# Patient Record
Sex: Male | Born: 1970
Health system: Southern US, Community
[De-identification: ages and names within clinical notes are randomized; demographics above are authoritative.]

## PROBLEM LIST (undated history)

## (undated) ENCOUNTER — Emergency Department (HOSPITAL_COMMUNITY): Admission: EM | Payer: Self-pay | Source: Home / Self Care

## (undated) DIAGNOSIS — H35033 Hypertensive retinopathy, bilateral: Secondary | ICD-10-CM

## (undated) DIAGNOSIS — J9601 Acute respiratory failure with hypoxia: Secondary | ICD-10-CM

## (undated) DIAGNOSIS — E7800 Pure hypercholesterolemia, unspecified: Secondary | ICD-10-CM

## (undated) DIAGNOSIS — N189 Chronic kidney disease, unspecified: Secondary | ICD-10-CM

## (undated) DIAGNOSIS — D631 Anemia in chronic kidney disease: Secondary | ICD-10-CM

## (undated) DIAGNOSIS — I5032 Chronic diastolic (congestive) heart failure: Secondary | ICD-10-CM

## (undated) DIAGNOSIS — R519 Headache, unspecified: Secondary | ICD-10-CM

## (undated) DIAGNOSIS — H269 Unspecified cataract: Secondary | ICD-10-CM

## (undated) DIAGNOSIS — R079 Chest pain, unspecified: Secondary | ICD-10-CM

## (undated) DIAGNOSIS — I509 Heart failure, unspecified: Secondary | ICD-10-CM

## (undated) DIAGNOSIS — R51 Headache: Secondary | ICD-10-CM

## (undated) DIAGNOSIS — E119 Type 2 diabetes mellitus without complications: Secondary | ICD-10-CM

## (undated) DIAGNOSIS — D509 Iron deficiency anemia, unspecified: Secondary | ICD-10-CM

## (undated) DIAGNOSIS — B974 Respiratory syncytial virus as the cause of diseases classified elsewhere: Secondary | ICD-10-CM

## (undated) DIAGNOSIS — J189 Pneumonia, unspecified organism: Secondary | ICD-10-CM

## (undated) DIAGNOSIS — R609 Edema, unspecified: Secondary | ICD-10-CM

## (undated) DIAGNOSIS — E113519 Type 2 diabetes mellitus with proliferative diabetic retinopathy with macular edema, unspecified eye: Secondary | ICD-10-CM

## (undated) DIAGNOSIS — I10 Essential (primary) hypertension: Secondary | ICD-10-CM

## (undated) HISTORY — DX: Anemia in chronic kidney disease: D63.1

## (undated) HISTORY — DX: Chronic kidney disease, unspecified: N18.9

## (undated) HISTORY — DX: Unspecified cataract: H26.9

## (undated) HISTORY — DX: Type 2 diabetes mellitus with proliferative diabetic retinopathy with macular edema, unspecified eye: E11.3519

## (undated) HISTORY — DX: Type 2 diabetes mellitus without complications: E11.9

## (undated) HISTORY — DX: Edema, unspecified: R60.9

## (undated) HISTORY — DX: Essential (primary) hypertension: I10

## (undated) HISTORY — DX: Iron deficiency anemia, unspecified: D50.9

## (undated) HISTORY — DX: Chronic diastolic (congestive) heart failure: I50.32

## (undated) HISTORY — DX: Hypertensive retinopathy, bilateral: H35.033

---

## 1997-08-03 DIAGNOSIS — E119 Type 2 diabetes mellitus without complications: Secondary | ICD-10-CM

## 1997-08-03 HISTORY — DX: Type 2 diabetes mellitus without complications: E11.9

## 2001-10-20 DIAGNOSIS — E1122 Type 2 diabetes mellitus with diabetic chronic kidney disease: Secondary | ICD-10-CM

## 2001-10-20 DIAGNOSIS — E1129 Type 2 diabetes mellitus with other diabetic kidney complication: Secondary | ICD-10-CM | POA: Insufficient documentation

## 2001-10-20 DIAGNOSIS — E118 Type 2 diabetes mellitus with unspecified complications: Secondary | ICD-10-CM | POA: Insufficient documentation

## 2001-10-20 DIAGNOSIS — N186 End stage renal disease: Secondary | ICD-10-CM

## 2004-06-17 ENCOUNTER — Ambulatory Visit: Payer: Self-pay | Admitting: *Deleted

## 2004-06-17 ENCOUNTER — Ambulatory Visit: Payer: Self-pay | Admitting: Family Medicine

## 2004-07-29 ENCOUNTER — Ambulatory Visit: Payer: Self-pay | Admitting: Family Medicine

## 2004-09-30 ENCOUNTER — Ambulatory Visit: Payer: Self-pay | Admitting: Family Medicine

## 2004-11-11 ENCOUNTER — Ambulatory Visit: Payer: Self-pay | Admitting: Family Medicine

## 2004-12-30 ENCOUNTER — Ambulatory Visit: Payer: Self-pay | Admitting: Family Medicine

## 2005-01-30 ENCOUNTER — Ambulatory Visit: Payer: Self-pay | Admitting: Family Medicine

## 2005-04-01 ENCOUNTER — Ambulatory Visit: Payer: Self-pay | Admitting: Family Medicine

## 2005-07-07 ENCOUNTER — Ambulatory Visit: Payer: Self-pay | Admitting: Family Medicine

## 2005-08-26 ENCOUNTER — Ambulatory Visit: Payer: Self-pay | Admitting: Family Medicine

## 2005-09-30 ENCOUNTER — Ambulatory Visit: Payer: Self-pay | Admitting: Family Medicine

## 2005-11-30 ENCOUNTER — Ambulatory Visit: Payer: Self-pay | Admitting: Family Medicine

## 2006-04-01 ENCOUNTER — Ambulatory Visit: Payer: Self-pay | Admitting: Family Medicine

## 2006-07-02 ENCOUNTER — Ambulatory Visit: Payer: Self-pay | Admitting: Family Medicine

## 2006-10-11 ENCOUNTER — Ambulatory Visit: Payer: Self-pay | Admitting: Family Medicine

## 2007-01-11 ENCOUNTER — Ambulatory Visit: Payer: Self-pay | Admitting: Family Medicine

## 2007-02-01 ENCOUNTER — Ambulatory Visit: Payer: Self-pay | Admitting: Family Medicine

## 2007-04-20 ENCOUNTER — Encounter (INDEPENDENT_AMBULATORY_CARE_PROVIDER_SITE_OTHER): Payer: Self-pay | Admitting: *Deleted

## 2007-05-29 ENCOUNTER — Emergency Department (HOSPITAL_COMMUNITY): Admission: EM | Admit: 2007-05-29 | Discharge: 2007-05-29 | Payer: Self-pay | Admitting: Emergency Medicine

## 2007-06-07 ENCOUNTER — Emergency Department (HOSPITAL_COMMUNITY): Admission: EM | Admit: 2007-06-07 | Discharge: 2007-06-07 | Payer: Self-pay | Admitting: Emergency Medicine

## 2010-01-31 ENCOUNTER — Emergency Department (HOSPITAL_COMMUNITY): Admission: EM | Admit: 2010-01-31 | Discharge: 2010-01-31 | Payer: Self-pay | Admitting: Emergency Medicine

## 2016-06-04 ENCOUNTER — Ambulatory Visit (INDEPENDENT_AMBULATORY_CARE_PROVIDER_SITE_OTHER): Payer: Self-pay | Admitting: Internal Medicine

## 2016-06-04 ENCOUNTER — Encounter: Payer: Self-pay | Admitting: Internal Medicine

## 2016-06-04 VITALS — BP 128/68 | HR 86 | Ht 63.0 in | Wt 120.0 lb

## 2016-06-04 DIAGNOSIS — E118 Type 2 diabetes mellitus with unspecified complications: Secondary | ICD-10-CM

## 2016-06-04 DIAGNOSIS — E1165 Type 2 diabetes mellitus with hyperglycemia: Secondary | ICD-10-CM

## 2016-06-04 DIAGNOSIS — R609 Edema, unspecified: Secondary | ICD-10-CM

## 2016-06-04 DIAGNOSIS — IMO0002 Reserved for concepts with insufficient information to code with codable children: Secondary | ICD-10-CM

## 2016-06-04 DIAGNOSIS — K029 Dental caries, unspecified: Secondary | ICD-10-CM

## 2016-06-04 MED ORDER — GLIPIZIDE 5 MG PO TABS: ORAL_TABLET | ORAL | 11 refills | Status: DC

## 2016-06-04 MED ORDER — GLUCOSE BLOOD VI STRP: ORAL_STRIP | 12 refills | Status: DC

## 2016-06-04 MED ORDER — METFORMIN HCL 500 MG PO TABS: ORAL_TABLET | ORAL | 11 refills | Status: DC

## 2016-06-04 MED ORDER — AGAMATRIX PRESTO W/DEVICE KIT: PACK | 0 refills | Status: DC

## 2016-06-04 NOTE — Progress Notes (Signed)
Subjective:    Patient ID: Blake Santana, male    DOB: 08/03/1971, 45 y.o.   MRN: [id number]  HPI   Here to establish.  1.  DM Type 2:  Has had for more than 10 years.  Has not used medication for 4 years as he could not find help. Has taken Metformin 500 mg twice daily as well as insulin prior to that.  He states when he was on the Metformin it worked well for him.   Cannot recall if he ever took Glipizide. States he has not lost or gained any weight throughout the years. No history of hypercholesterolemia. Denies history of renal disease Does have numbness and tingling in bilateral feet only in distal feet--mainly toes. No chest pain or problems with heart in past Blurry vision for 2 years.  More on the left than right. Reading glasses have helped.    2.  Ankle swelling every day for past 3 years.  Swelling is better in the morning. Elevates feet at night.  No outpatient prescriptions have been marked as taking for the 06/04/16 encounter (Office Visit) with Mack Hook, MD.    No Known Allergies   Past Medical History:  Diagnosis Date  . Diabetes mellitus without complication ([place]) 7353   History reviewed. No pertinent surgical history.   Family History  Problem Relation Age of Onset  . Diabetes Mother   . Kidney disease Mother     renal failure--diabetes, cause of death  . Hypertension Mother   . Diabetes Father   . Kidney disease Father     Cause of death:  kidney failure form diabetes  . Alcohol abuse Father   . Diabetes Sister    Social History   Social History  . Marital status: Married    Spouse name: [name]  . Number of children: 1  . Years of education: 9   Occupational History  . [employer]    Social History Main Topics  . Smoking status: Former Research scientist (life sciences)  . Smokeless tobacco: Never Used  . Alcohol use No     Comment: Used to drink too much once weekly, but nothing for 1 year.  . Drug use: No  . Sexual activity: Not on file    Other Topics Concern  . Not on file   Social History Narrative   Originally from [place]   Came to [place]. In [place] with his girlfriend and their son.              Review of Systems     Objective:   Physical Exam Very pale and thin, appears chronically ill. HEENT:  PERRL, EOMI, unable to see discs well. Very pale palpebral conjunctivae and palms. TMs pearly gray, throat without injection.  Significant dental decay Neck:  Supple, No adenopathy Chest:  CTA CV:  RRR with normal S1 and S2, No S3, S4 or murmur. No carotid bruits.  Carotid, radial and DP pulses normal and equal. Abd:  S, NT, No HSM or mass, + BS LE:  Circular scars on pretibial areas bilaterally.  Mild pitting edema       Assessment & Plan:  1.  DM, uncontrolled:  Check A1C, start Glipizide 5 mg twice daily with food.  Metformin 500 mg twice daily with food. Urine microalbumin.   CMP Long discussion regarding good diet/lifestyle habits. Monitoring device Rx sent, to check glucose at least twice daily and bring those in at next visit (2 weeks). Encouraged Influenza  at [facility] or will see in 2 weeks if we have more available.  2.  Appears to be anemic:  CBC, follow up lab as needed.  3.  Dental Decay:  Dental referral.  4.  Ankle edema:  Checking renal function, level of anemia before any treatment.

## 2016-06-05 LAB — COMPREHENSIVE METABOLIC PANEL
A/G RATIO: 1.2 (ref 1.2–2.2)
ALBUMIN: 3.6 g/dL (ref 3.5–5.5)
ALT: 12 IU/L (ref 0–44)
AST: 15 IU/L (ref 0–40)
Alkaline Phosphatase: 90 IU/L (ref 39–117)
BILIRUBIN TOTAL: 0.3 mg/dL (ref 0.0–1.2)
BUN / CREAT RATIO: 20 (ref 9–20)
BUN: 21 mg/dL (ref 6–24)
CHLORIDE: 97 mmol/L (ref 96–106)
CO2: 27 mmol/L (ref 18–29)
Calcium: 8.9 mg/dL (ref 8.7–10.2)
Creatinine, Ser: 1.05 mg/dL (ref 0.76–1.27)
GFR calc non Af Amer: 84 mL/min/{1.73_m2} (ref >59)
GFR, EST AFRICAN AMERICAN: 97 mL/min/{1.73_m2} (ref >59)
Globulin, Total: 2.9 g/dL (ref 1.5–4.5)
Glucose: 515 mg/dL (ref 65–99)
POTASSIUM: 4.5 mmol/L (ref 3.5–5.2)
Sodium: 136 mmol/L (ref 134–144)
TOTAL PROTEIN: 6.5 g/dL (ref 6.0–8.5)

## 2016-06-05 LAB — CBC WITH DIFFERENTIAL/PLATELET
BASOS ABS: 0 10*3/uL (ref 0.0–0.2)
Basos: 1 %
EOS (ABSOLUTE): 0.3 10*3/uL (ref 0.0–0.4)
Eos: 6 %
Hematocrit: 33.4 % — ABNORMAL LOW (ref 37.5–51.0)
Hemoglobin: 10.8 g/dL — ABNORMAL LOW (ref 12.6–17.7)
Immature Grans (Abs): 0 10*3/uL (ref 0.0–0.1)
Immature Granulocytes: 0 %
LYMPHS ABS: 1.9 10*3/uL (ref 0.7–3.1)
Lymphs: 31 %
MCH: 28.7 pg (ref 26.6–33.0)
MCHC: 32.3 g/dL (ref 31.5–35.7)
MCV: 89 fL (ref 79–97)
MONOS ABS: 0.4 10*3/uL (ref 0.1–0.9)
Monocytes: 6 %
NEUTROS ABS: 3.4 10*3/uL (ref 1.4–7.0)
Neutrophils: 56 %
PLATELETS: 223 10*3/uL (ref 150–379)
RBC: 3.76 x10E6/uL — ABNORMAL LOW (ref 4.14–5.80)
RDW: 13.5 % (ref 12.3–15.4)
WBC: 6 10*3/uL (ref 3.4–10.8)

## 2016-06-05 LAB — MICROALBUMIN, URINE: Microalbumin, Urine: 371 ug/mL

## 2016-06-05 LAB — HGB A1C W/O EAG: HEMOGLOBIN A1C: 14.2 % — AB (ref 4.8–5.6)

## 2016-06-18 ENCOUNTER — Encounter: Payer: Self-pay | Admitting: Internal Medicine

## 2016-06-18 ENCOUNTER — Ambulatory Visit (INDEPENDENT_AMBULATORY_CARE_PROVIDER_SITE_OTHER): Payer: Self-pay | Admitting: Internal Medicine

## 2016-06-18 VITALS — BP 120/70 | HR 72 | Resp 12 | Ht 62.0 in | Wt 120.0 lb

## 2016-06-18 DIAGNOSIS — E118 Type 2 diabetes mellitus with unspecified complications: Secondary | ICD-10-CM

## 2016-06-18 DIAGNOSIS — N189 Chronic kidney disease, unspecified: Secondary | ICD-10-CM | POA: Insufficient documentation

## 2016-06-18 DIAGNOSIS — D631 Anemia in chronic kidney disease: Secondary | ICD-10-CM | POA: Insufficient documentation

## 2016-06-18 DIAGNOSIS — D649 Anemia, unspecified: Secondary | ICD-10-CM

## 2016-06-18 DIAGNOSIS — D509 Iron deficiency anemia, unspecified: Secondary | ICD-10-CM | POA: Insufficient documentation

## 2016-06-18 DIAGNOSIS — IMO0002 Reserved for concepts with insufficient information to code with codable children: Secondary | ICD-10-CM

## 2016-06-18 DIAGNOSIS — E1165 Type 2 diabetes mellitus with hyperglycemia: Secondary | ICD-10-CM

## 2016-06-18 DIAGNOSIS — R809 Proteinuria, unspecified: Secondary | ICD-10-CM

## 2016-06-18 HISTORY — DX: Iron deficiency anemia, unspecified: D50.9

## 2016-06-18 LAB — GLUCOSE, POCT (MANUAL RESULT ENTRY): POC Glucose: 392 mg/dl — AB (ref 70–99)

## 2016-06-18 MED ORDER — LISINOPRIL 2.5 MG PO TABS: 2.5000 mg | ORAL_TABLET | Freq: Every day | ORAL | 11 refills | Status: DC

## 2016-06-18 NOTE — Progress Notes (Signed)
   Subjective:    Patient ID: Blake Santana, male    DOB: 1971/03/16, 45 y.o.   MRN: [id number]  HPI 1.  DM type 2:  Took  Glipizide and Metformin for 1 week.  Stopped as he was having diarrhea. While he was taking medication the lowest sugar was 390.  2. Anemia CBC Latest Ref Rng & Units 06/04/2016  WBC 3.4 - 10.8 x10E3/uL 6.0  Hematocrit 37.5 - 51.0 % 33.4(L)  Platelets 150 - 379 x10E3/uL 223   Denies hematochezia or melena.  Kidney function was okay, though did have microalbuminuria and has low muscle mass. Normocytic.   3.  Peripheral edema:  Again, moderate anemia, microalbuminuria.  Is a bit decreased.     4 .  Microalbuminuria:  Not taking ACE I. Discussed.  Current Meds  Medication Sig  . Blood Glucose Monitoring Suppl (AGAMATRIX PRESTO) w/Device KIT Check sugars twice daily  . glucose blood (AGAMATRIX PRESTO TEST) test strip Check sugars twice daily    No Known Allergies  Review of Systems     Objective:   Physical Exam  Pale, NAD Lungs:  CTA CV:  RRR without murmur or rub, radial and DP pulses normal and equal LE:  No edema        Assessment & Plan:  1.  DM:  To try Glipizide 5 mg twice daily with meals alone for 1 week.  If tolerates, to add Metformin 250 mg with breakfast (after the meal) daily.  Stop Metformin if diarrhea recurs.  Call the clinic with diarrhea recurrence.  2.  Anemia:  Iron plus TIBC/% sat. Vitamin B12.    3.  Microalbuminuria:  2.5 mg Lisinopril daily -start.  4.  LE edema:  Resolved.  Follow up 1 month

## 2016-06-19 LAB — IRON AND TIBC
IRON SATURATION: 28 % (ref 15–55)
IRON: 82 ug/dL (ref 38–169)
TIBC: 298 ug/dL (ref 250–450)
UIBC: 216 ug/dL (ref 111–343)

## 2016-06-19 LAB — VITAMIN B12: VITAMIN B 12: 1104 pg/mL — AB (ref 211–946)

## 2016-07-03 NOTE — Progress Notes (Signed)
Patient informed of results and will get Ferrous Gluconate today

## 2016-07-03 NOTE — Progress Notes (Signed)
Pt. Aware of Ferrous Gluconate 325 mg twice daily with half an orange

## 2016-07-16 ENCOUNTER — Encounter: Payer: Self-pay | Admitting: Internal Medicine

## 2016-07-16 ENCOUNTER — Ambulatory Visit (INDEPENDENT_AMBULATORY_CARE_PROVIDER_SITE_OTHER): Payer: Self-pay | Admitting: Internal Medicine

## 2016-07-16 VITALS — BP 140/82 | HR 82 | Resp 12 | Ht 62.75 in | Wt 126.0 lb

## 2016-07-16 DIAGNOSIS — E1165 Type 2 diabetes mellitus with hyperglycemia: Secondary | ICD-10-CM

## 2016-07-16 DIAGNOSIS — H538 Other visual disturbances: Secondary | ICD-10-CM

## 2016-07-16 DIAGNOSIS — E118 Type 2 diabetes mellitus with unspecified complications: Secondary | ICD-10-CM

## 2016-07-16 DIAGNOSIS — R809 Proteinuria, unspecified: Secondary | ICD-10-CM

## 2016-07-16 DIAGNOSIS — IMO0002 Reserved for concepts with insufficient information to code with codable children: Secondary | ICD-10-CM

## 2016-07-16 LAB — GLUCOSE, POCT (MANUAL RESULT ENTRY): POC GLUCOSE: 151 mg/dL — AB (ref 70–99)

## 2016-07-16 NOTE — Progress Notes (Signed)
Subjective:    Patient ID: Blake Santana, male    DOB: [birth year], 45 y.o.   MRN: [id number]  HPI   1.  DM:  Did fine taking Glipizide alone twice daily with meals. Was able to add Metformin back--taking at end of meal twice daily for 3 weeks.   Checking sugars in the morning before meal and sometimes in the afternoon before meals, but only once daily.  Has been writing down, but forgot to bring his documentation. Sugars in the morning running under 200.  Generally, in high 100 range.  States he has made changes in how he eats.  States working on eating more vegetables daily and less meat.  His wife does most of the cooking.  She can come with him in the future with a visit to discuss cooking for the entire family (3 kids at home) He is having blurry vision with driving and at work.  Has not had an eye check yet--willing to pay $69.  Taking low dose Lisinopril for microalbuminuria.   2.  States swelling of legs was getting better, but about 1 week ago, began getting worse again.   He has not been eating saltier food or more prepared food.  He has been on his feet more.    3.  HM:  Has not had influenza vaccine yet and we are out.       4  Mild iron deficiency anemia:  Started Ferrous Gluconate elemental iron 27 mg twice daily with half an orange.  Vitamin B12 was normal. No problems with iron.  Has not received guaiac cards yet.    Current Meds  Medication Sig  . Blood Glucose Monitoring Suppl (AGAMATRIX PRESTO) w/Device KIT Check sugars twice daily  . ferrous gluconate (FERGON) 225 (27 Fe) MG tablet Take 240 mg by mouth 2 (two) times daily.  Marland Kitchen glipiZIDE (GLUCOTROL) 5 MG tablet 1 tab by mouth twice daily with meals  . glucose blood (AGAMATRIX PRESTO TEST) test strip Check sugars twice daily  . lisinopril (PRINIVIL,ZESTRIL) 2.5 MG tablet Take 1 tablet (2.5 mg total) by mouth daily.  . metFORMIN (GLUCOPHAGE) 500 MG tablet 1 tab by mouth twice daily with food    No Known  Allergies      Review of Systems     Objective:   Physical Exam   HEENT:  PERRL, EOMI, discs sharp Neck:  Supple, No adenopathy Chest:  CTA CV:  RRR with normal S1 and S2, No S3, S4 or murmur.  No JVD, Radial and DP pulses normal and equal Varices of LE bilaterally with mild edema of ankles. Abd: S, NT, No HSM or mass, + BS        Assessment & Plan:  1.  DM Type 2:  Control improving.  Glucose today is 151, less than half of that last month.  Return in 3 months fasting for labs:  FLP, A1C, BMP. OV with me 1 week later  2.  Blurred Vision:  Likely due to improved glucose.  Does, however, need yearly eye check--referral to Optometry.  OK with $69 charge.  3.  Microalbuminuria:  Lisinopril.  BP up a bit today--follow.  4.  Iron deficiency anemia:  Taking iron.  Needs to get 3 card Guaiac packet back in 2 weeks.  Return for CBC in 1 month  5.  LE edema:  Does have varicosities of LE:  Gave measurements with order form for either knee high or thigh high compression stockings--measurements line  up more with women's sizing, but circled both men's and women's small for him.  Avoid salt and elevate legs at night when home.

## 2016-07-16 NOTE — Patient Instructions (Addendum)
Measured for thigh high and knee high compression stockings--small size:  Ordering booklet for lower cost given. To put on every morning when gets up and take off prior to bedtime.  Get stool cards back to clinic completed in 2 weeks.

## 2016-08-18 ENCOUNTER — Other Ambulatory Visit (INDEPENDENT_AMBULATORY_CARE_PROVIDER_SITE_OTHER): Payer: Self-pay

## 2016-08-18 DIAGNOSIS — Z1211 Encounter for screening for malignant neoplasm of colon: Secondary | ICD-10-CM

## 2016-08-18 LAB — POC HEMOCCULT BLD/STL (HOME/3-CARD/SCREEN)
FECAL OCCULT BLD: NEGATIVE
FECAL OCCULT BLD: NEGATIVE
Fecal Occult Blood, POC: NEGATIVE

## 2016-08-20 ENCOUNTER — Other Ambulatory Visit: Payer: Self-pay

## 2016-09-23 ENCOUNTER — Telehealth: Payer: Self-pay | Admitting: Internal Medicine

## 2016-09-23 NOTE — Telephone Encounter (Signed)
Patient states he has been seen by Optometrist Dr. Clent Demark at [facility] [phone] on 08/24/2016 and he gave him the Rx for eye glasses (Rx already faxed to [facility] for voucher) but also told him that he needs to probably have surgery because it looks like patient has "blood inside the eye" so patient wants Dr. Amil Amen to review the notes and see what she suggest doing or if she needs to be referred to surgery. Notes were given to Dr. Amil Amen already  Patient is also having eye pain and he has blurry vision on left eye

## 2016-09-24 NOTE — Telephone Encounter (Signed)
To Dr. Mulberry for further direction 

## 2016-10-08 ENCOUNTER — Other Ambulatory Visit (INDEPENDENT_AMBULATORY_CARE_PROVIDER_SITE_OTHER): Payer: Self-pay

## 2016-10-08 ENCOUNTER — Telehealth: Payer: Self-pay | Admitting: Internal Medicine

## 2016-10-08 DIAGNOSIS — IMO0002 Reserved for concepts with insufficient information to code with codable children: Secondary | ICD-10-CM

## 2016-10-08 DIAGNOSIS — Z79899 Other long term (current) drug therapy: Secondary | ICD-10-CM

## 2016-10-08 DIAGNOSIS — E1165 Type 2 diabetes mellitus with hyperglycemia: Secondary | ICD-10-CM

## 2016-10-08 DIAGNOSIS — Z13220 Encounter for screening for lipoid disorders: Secondary | ICD-10-CM

## 2016-10-08 DIAGNOSIS — E118 Type 2 diabetes mellitus with unspecified complications: Secondary | ICD-10-CM

## 2016-10-08 DIAGNOSIS — D649 Anemia, unspecified: Secondary | ICD-10-CM

## 2016-10-08 NOTE — Progress Notes (Signed)
Patient was notified of labs result today in person as I have been unable to reach him for quite some time

## 2016-10-08 NOTE — Telephone Encounter (Signed)
Patient says he went to his optometrist appointment and got his glasses and was given the results from x-rays that he brought into the clinic. He wants to know what the results from the x-ray were.

## 2016-10-08 NOTE — Telephone Encounter (Signed)
To Dr. Mulberry for further direction 

## 2016-10-09 LAB — CBC WITH DIFFERENTIAL/PLATELET
Basophils Absolute: 0 10*3/uL (ref 0.0–0.2)
Basos: 0 %
EOS (ABSOLUTE): 0.7 10*3/uL — AB (ref 0.0–0.4)
EOS: 7 %
HEMATOCRIT: 28.1 % — AB (ref 37.5–51.0)
Hemoglobin: 9.3 g/dL — ABNORMAL LOW (ref 13.0–17.7)
Immature Grans (Abs): 0 10*3/uL (ref 0.0–0.1)
Immature Granulocytes: 0 %
LYMPHS ABS: 2.2 10*3/uL (ref 0.7–3.1)
Lymphs: 24 %
MCH: 28.9 pg (ref 26.6–33.0)
MCHC: 33.1 g/dL (ref 31.5–35.7)
MCV: 87 fL (ref 79–97)
MONOS ABS: 0.8 10*3/uL (ref 0.1–0.9)
Monocytes: 9 %
NEUTROS PCT: 60 %
Neutrophils Absolute: 5.4 10*3/uL (ref 1.4–7.0)
PLATELETS: 214 10*3/uL (ref 150–379)
RBC: 3.22 x10E6/uL — AB (ref 4.14–5.80)
RDW: 14.1 % (ref 12.3–15.4)
WBC: 9.2 10*3/uL (ref 3.4–10.8)

## 2016-10-09 LAB — BASIC METABOLIC PANEL
BUN/Creatinine Ratio: 24 — ABNORMAL HIGH (ref 9–20)
BUN: 32 mg/dL — AB (ref 6–24)
CALCIUM: 9 mg/dL (ref 8.7–10.2)
CO2: 26 mmol/L (ref 18–29)
CREATININE: 1.34 mg/dL — AB (ref 0.76–1.27)
Chloride: 102 mmol/L (ref 96–106)
GFR, EST AFRICAN AMERICAN: 72 mL/min/{1.73_m2} (ref >59)
GFR, EST NON AFRICAN AMERICAN: 63 mL/min/{1.73_m2} (ref >59)
Glucose: 90 mg/dL (ref 65–99)
POTASSIUM: 4.8 mmol/L (ref 3.5–5.2)
Sodium: 142 mmol/L (ref 134–144)

## 2016-10-09 LAB — HGB A1C W/O EAG: Hgb A1c MFr Bld: 7.6 % — ABNORMAL HIGH (ref 4.8–5.6)

## 2016-10-09 LAB — LIPID PANEL W/O CHOL/HDL RATIO
Cholesterol, Total: 181 mg/dL (ref 100–199)
HDL: 46 mg/dL (ref >39)
LDL CALC: 120 mg/dL — AB (ref 0–99)
TRIGLYCERIDES: 73 mg/dL (ref 0–149)
VLDL CHOLESTEROL CAL: 15 mg/dL (ref 5–40)

## 2016-10-12 NOTE — Telephone Encounter (Signed)
This is in here twice?  Please confirm the initial one came back to you and we should always try to answer on the same note and not send out 2 separate ones.

## 2016-10-12 NOTE — Telephone Encounter (Signed)
I am confused:  What xray did her get with optometry?  I have his prescription for eyeglasses in his media tab, but no xray results. Was he wanting a voucher for eyeglasses?  If so, please arrange for that and they will need his optometry prescription.

## 2016-10-13 NOTE — Telephone Encounter (Signed)
To Estefania. Please call patient to clarify exactly what he needs

## 2016-10-13 NOTE — Telephone Encounter (Signed)
Message was received. See next note

## 2016-10-15 ENCOUNTER — Ambulatory Visit (INDEPENDENT_AMBULATORY_CARE_PROVIDER_SITE_OTHER): Payer: Self-pay | Admitting: Internal Medicine

## 2016-10-15 ENCOUNTER — Encounter: Payer: Self-pay | Admitting: Internal Medicine

## 2016-10-15 VITALS — BP 144/90 | HR 74 | Resp 14 | Ht 63.0 in | Wt 125.0 lb

## 2016-10-15 DIAGNOSIS — E113313 Type 2 diabetes mellitus with moderate nonproliferative diabetic retinopathy with macular edema, bilateral: Secondary | ICD-10-CM

## 2016-10-15 DIAGNOSIS — IMO0002 Reserved for concepts with insufficient information to code with codable children: Secondary | ICD-10-CM

## 2016-10-15 DIAGNOSIS — R7989 Other specified abnormal findings of blood chemistry: Secondary | ICD-10-CM

## 2016-10-15 DIAGNOSIS — E1165 Type 2 diabetes mellitus with hyperglycemia: Secondary | ICD-10-CM

## 2016-10-15 DIAGNOSIS — R809 Proteinuria, unspecified: Secondary | ICD-10-CM

## 2016-10-15 DIAGNOSIS — E118 Type 2 diabetes mellitus with unspecified complications: Secondary | ICD-10-CM

## 2016-10-15 DIAGNOSIS — E11319 Type 2 diabetes mellitus with unspecified diabetic retinopathy without macular edema: Secondary | ICD-10-CM | POA: Insufficient documentation

## 2016-10-15 DIAGNOSIS — E785 Hyperlipidemia, unspecified: Secondary | ICD-10-CM | POA: Insufficient documentation

## 2016-10-15 MED ORDER — METFORMIN HCL ER 500 MG PO TB24: ORAL_TABLET | ORAL | 11 refills | Status: DC

## 2016-10-15 MED ORDER — ATORVASTATIN CALCIUM 20 MG PO TABS: ORAL_TABLET | ORAL | 11 refills | Status: DC

## 2016-10-15 NOTE — Progress Notes (Signed)
   Subjective:    Patient ID: Blake Santana, male    DOB: [birth year], 46 y.o.   MRN: [id number]  HPI   1.  DM:  Holding Metformin once weekly as develops diarrhea with regular release.  His A1C is down significantly to 7.6% from 14 range in November. Creatinine recently a bit high at 1.34.    2.  Anemia:  B12 and Folate were okay.  Iron studies were really in normal range, though toward low end of normal.  Stool cards were negative for blood times 3.  Has been taking iron twice daily.  Suspect this is anemia of chronic disease.   3.  Microalbuminuria:  BP up a bit today.  States he has not missed low dose Lisinopril.    4.  Hyperlipidemia:   Discussed his LDL is still too high and would recommend treatment.   Feels he is eating in healthy way Has not worked in 1 month--not physically active.  Has not been able to see well enough to paint as vision too impaired.   Cannot recall name of eye doctor.  Glasses not helping that he received--Vision works.  He was told he had diabetic changes or bleeding in one of his eyes and should go to a retinal specialist. Lipid Panel     Component Value Date/Time   CHOL 181 10/08/2016 1001   TRIG 73 10/08/2016 1001   HDL 46 10/08/2016 1001   LDLCALC 120 (H) 10/08/2016 1001    Current Meds  Medication Sig  . Blood Glucose Monitoring Suppl (AGAMATRIX PRESTO) w/Device KIT Check sugars twice daily  . ferrous gluconate (FERGON) 225 (27 Fe) MG tablet Take 240 mg by mouth 2 (two) times daily.  Marland Kitchen glipiZIDE (GLUCOTROL) 5 MG tablet 1 tab by mouth twice daily with meals  . glucose blood (AGAMATRIX PRESTO TEST) test strip Check sugars twice daily  . lisinopril (PRINIVIL,ZESTRIL) 2.5 MG tablet Take 1 tablet (2.5 mg total) by mouth daily.  . [DISCONTINUED] metFORMIN (GLUCOPHAGE) 500 MG tablet 1 tab by mouth twice daily with food   No Known Allergies    Review of Systems     Objective:   Physical Exam  NAD Still a bit pale appearing. HEENT:  PERRL,  EOMI Neck:  Supple, No adenopathy Chest:  CTA CV:  RRR with normal S1 and S2, No S3, S4 or murmur, radial pulses normal and equal LE:  No edema        Assessment & Plan:  1.  DM:  Much improved control, though would like to see closer to 7.0% or less.   Change to Metformin ER 500 mg twice daily and see if helps with diarrhea and to avoid missing doses.  2.  Diabetic nonproliferative retinopathy with macular edema:  Referral to retinal specialist for further evaluation and treatment.  3.  Elevated serum creatinine:  Recheck today.  Does also have microalbuminuria:  Continue low dose Lisinopril  4.  Anemia:  See if creatinine remains high before considering colonoscopy.  No improvement with iron supplementation and stool cards negative for blood.  5.  Dyslipidemia:  Start low dose Atorvastatin.  FLP and hepatic profile in 6 weeks.  To continue to work on diet and physical activity.

## 2016-10-16 LAB — BASIC METABOLIC PANEL
BUN/Creatinine Ratio: 20 (ref 9–20)
BUN: 28 mg/dL — AB (ref 6–24)
CALCIUM: 9 mg/dL (ref 8.7–10.2)
CHLORIDE: 102 mmol/L (ref 96–106)
CO2: 27 mmol/L (ref 18–29)
CREATININE: 1.39 mg/dL — AB (ref 0.76–1.27)
GFR, EST AFRICAN AMERICAN: 69 mL/min/{1.73_m2} (ref >59)
GFR, EST NON AFRICAN AMERICAN: 60 mL/min/{1.73_m2} (ref >59)
Glucose: 120 mg/dL — ABNORMAL HIGH (ref 65–99)
Potassium: 4.3 mmol/L (ref 3.5–5.2)
Sodium: 142 mmol/L (ref 134–144)

## 2016-10-16 LAB — TSH: TSH: 1.36 u[IU]/mL (ref 0.450–4.500)

## 2017-01-14 ENCOUNTER — Ambulatory Visit (INDEPENDENT_AMBULATORY_CARE_PROVIDER_SITE_OTHER): Payer: Self-pay | Admitting: Internal Medicine

## 2017-01-14 ENCOUNTER — Encounter: Payer: Self-pay | Admitting: Internal Medicine

## 2017-01-14 VITALS — BP 170/90 | HR 82 | Resp 16 | Ht 63.0 in | Wt 137.5 lb

## 2017-01-14 DIAGNOSIS — IMO0002 Reserved for concepts with insufficient information to code with codable children: Secondary | ICD-10-CM

## 2017-01-14 DIAGNOSIS — E1165 Type 2 diabetes mellitus with hyperglycemia: Secondary | ICD-10-CM

## 2017-01-14 DIAGNOSIS — L259 Unspecified contact dermatitis, unspecified cause: Secondary | ICD-10-CM

## 2017-01-14 DIAGNOSIS — Z23 Encounter for immunization: Secondary | ICD-10-CM

## 2017-01-14 DIAGNOSIS — D509 Iron deficiency anemia, unspecified: Secondary | ICD-10-CM

## 2017-01-14 DIAGNOSIS — E113313 Type 2 diabetes mellitus with moderate nonproliferative diabetic retinopathy with macular edema, bilateral: Secondary | ICD-10-CM

## 2017-01-14 DIAGNOSIS — R600 Localized edema: Secondary | ICD-10-CM

## 2017-01-14 DIAGNOSIS — R609 Edema, unspecified: Secondary | ICD-10-CM

## 2017-01-14 DIAGNOSIS — E785 Hyperlipidemia, unspecified: Secondary | ICD-10-CM

## 2017-01-14 DIAGNOSIS — E118 Type 2 diabetes mellitus with unspecified complications: Secondary | ICD-10-CM

## 2017-01-14 HISTORY — DX: Edema, unspecified: R60.9

## 2017-01-14 MED ORDER — FUROSEMIDE 20 MG PO TABS: 20.0000 mg | ORAL_TABLET | Freq: Every day | ORAL | 11 refills | Status: DC

## 2017-01-14 MED ORDER — TRIAMCINOLONE ACETONIDE 0.1 % EX CREA: 1.0000 "application " | TOPICAL_CREAM | Freq: Two times a day (BID) | CUTANEOUS | 0 refills | Status: DC

## 2017-01-14 NOTE — Progress Notes (Signed)
   Subjective:    Patient ID: Blake Santana, male    DOB: 02/22/1971, 46 y.o.   MRN: [id number]  HPI   Patient not taking any of his medication save for what sounds like the Metformin ER.  Sounds like he went to pharmacy and that was the only one filled, so he just stopped the rest.  No meds as above since March 15th visit.  1. DM:  Not taking Glipizide.  Only what sounds like Metformin ER 500 mg twice daily.  Has been 3 months since A1C last checked and resulted 7.6% Did get into eye specialist. Had laser and injection of left eye June 7th.  Right eye treated similarly May 24th.  Goes back end of June to see if more treatment needed.  Vision is improved now.   2.  Essential Hypertension:  Not taking Lisinopril as above.   3.  CRI:  Has gained 12 lbs since last visit and is having some edema of feet/ankles.  Swelling improved previously with blood pressure reduction.  4.  Lesions on legs he is concerned about:  No itching.  Nothing rubs against the involved areas.  Has never had before.  Current Meds  Medication Sig  . Blood Glucose Monitoring Suppl (AGAMATRIX PRESTO) w/Device KIT Check sugars twice daily  . glucose blood (AGAMATRIX PRESTO TEST) test strip Check sugars twice daily  . metFORMIN (GLUCOPHAGE-XR) 500 MG 24 hr tablet 1 tab by mouth with meals twice daily    No Known Allergies  Review of Systems     Objective:   Physical Exam  Constitutional:  Pale and chronically ill appearing.  HENT:  Head: Normocephalic.  Eyes: EOM are normal. Pupils are equal, round, and reactive to light.  Neck: Normal range of motion. Neck supple.  Cardiovascular: Normal rate, regular rhythm, S1 normal and S2 normal.  Exam reveals no S3 and no S4.   No murmur heard. No carotid bruits.  Carotid, radial, femoral, DP and PT pulses normal and equal.   Pulmonary/Chest: Effort normal and breath sounds normal.  Abdominal: Soft. Bowel sounds are normal. He exhibits no mass. There is no  tenderness.  Musculoskeletal: He exhibits edema (of bilateral feet and ankles).  Neurological: He is alert.  Skin: Rash noted.             Assessment & Plan:  1.  DM:  A1C.  States sugars running in low 100s, so perhaps does not need Glipizide.  Check K3T to be certain  2.  Essential Hypertension:  Restart low dose Lisinopril and add Furosemide today.  BMP.  Return in 1 week to recheck BMP  3.  CRI:  BMP as above.  4.  ?Contact dermatitis:  Triamcinolone Cream 0.1% twice daily to areas.  To call if any sign of infection--redness, increased swelling, fever.  5.  Peripheral Edema:  Multifactorial.  Furosemide as above.  6.  Dyslipidemia:  Restart Atorvastatin.  FLP, CMP in 6 weeks.  7.  HM:  Tdap today.

## 2017-01-15 LAB — CBC WITH DIFFERENTIAL/PLATELET
BASOS: 1 %
Basophils Absolute: 0 10*3/uL (ref 0.0–0.2)
EOS (ABSOLUTE): 0.7 10*3/uL — ABNORMAL HIGH (ref 0.0–0.4)
EOS: 10 %
HEMATOCRIT: 26 % — AB (ref 37.5–51.0)
Hemoglobin: 8.4 g/dL — ABNORMAL LOW (ref 13.0–17.7)
IMMATURE GRANS (ABS): 0 10*3/uL (ref 0.0–0.1)
IMMATURE GRANULOCYTES: 0 %
LYMPHS: 26 %
Lymphocytes Absolute: 1.8 10*3/uL (ref 0.7–3.1)
MCH: 28.5 pg (ref 26.6–33.0)
MCHC: 32.3 g/dL (ref 31.5–35.7)
MCV: 88 fL (ref 79–97)
Monocytes Absolute: 0.4 10*3/uL (ref 0.1–0.9)
Monocytes: 6 %
NEUTROS ABS: 4 10*3/uL (ref 1.4–7.0)
NEUTROS PCT: 57 %
Platelets: 236 10*3/uL (ref 150–379)
RBC: 2.95 x10E6/uL — ABNORMAL LOW (ref 4.14–5.80)
RDW: 14.1 % (ref 12.3–15.4)
WBC: 7 10*3/uL (ref 3.4–10.8)

## 2017-01-15 LAB — COMPREHENSIVE METABOLIC PANEL
A/G RATIO: 1.2 (ref 1.2–2.2)
ALT: 8 IU/L (ref 0–44)
AST: 14 IU/L (ref 0–40)
Albumin: 3.3 g/dL — ABNORMAL LOW (ref 3.5–5.5)
Alkaline Phosphatase: 79 IU/L (ref 39–117)
BUN/Creatinine Ratio: 17 (ref 9–20)
BUN: 40 mg/dL — ABNORMAL HIGH (ref 6–24)
Bilirubin Total: 0.2 mg/dL (ref 0.0–1.2)
CALCIUM: 8.7 mg/dL (ref 8.7–10.2)
CO2: 23 mmol/L (ref 20–29)
CREATININE: 2.29 mg/dL — AB (ref 0.76–1.27)
Chloride: 109 mmol/L — ABNORMAL HIGH (ref 96–106)
GFR, EST AFRICAN AMERICAN: 38 mL/min/{1.73_m2} — AB (ref >59)
GFR, EST NON AFRICAN AMERICAN: 33 mL/min/{1.73_m2} — AB (ref >59)
GLOBULIN, TOTAL: 2.7 g/dL (ref 1.5–4.5)
Glucose: 131 mg/dL — ABNORMAL HIGH (ref 65–99)
Potassium: 4.8 mmol/L (ref 3.5–5.2)
SODIUM: 142 mmol/L (ref 134–144)
TOTAL PROTEIN: 6 g/dL (ref 6.0–8.5)

## 2017-01-15 LAB — HGB A1C W/O EAG: Hgb A1c MFr Bld: 8 % — ABNORMAL HIGH (ref 4.8–5.6)

## 2017-01-17 ENCOUNTER — Other Ambulatory Visit: Payer: Self-pay | Admitting: Internal Medicine

## 2017-01-17 DIAGNOSIS — N189 Chronic kidney disease, unspecified: Secondary | ICD-10-CM

## 2017-01-17 NOTE — Progress Notes (Signed)
Patient with increasing Creatinine.   Has had poorly controlled DM, though better recently, and poorly controlled BP.

## 2017-01-18 NOTE — Progress Notes (Signed)
Iron studies added on

## 2017-01-20 LAB — IRON AND TIBC
IRON SATURATION: 22 % (ref 15–55)
Iron: 61 ug/dL (ref 38–169)
TIBC: 274 ug/dL (ref 250–450)
UIBC: 213 ug/dL (ref 111–343)

## 2017-01-20 LAB — SPECIMEN STATUS REPORT

## 2017-01-22 ENCOUNTER — Other Ambulatory Visit: Payer: Self-pay

## 2017-03-04 ENCOUNTER — Other Ambulatory Visit (INDEPENDENT_AMBULATORY_CARE_PROVIDER_SITE_OTHER): Payer: Self-pay

## 2017-03-04 DIAGNOSIS — Z79899 Other long term (current) drug therapy: Secondary | ICD-10-CM

## 2017-03-04 DIAGNOSIS — E785 Hyperlipidemia, unspecified: Secondary | ICD-10-CM

## 2017-03-05 LAB — HEPATIC FUNCTION PANEL
ALK PHOS: 88 IU/L (ref 39–117)
ALT: 11 IU/L (ref 0–44)
AST: 16 IU/L (ref 0–40)
Albumin: 3.3 g/dL — ABNORMAL LOW (ref 3.5–5.5)
Bilirubin Total: 0.2 mg/dL (ref 0.0–1.2)
Bilirubin, Direct: 0.1 mg/dL (ref 0.00–0.40)
Total Protein: 6.5 g/dL (ref 6.0–8.5)

## 2017-03-05 LAB — LIPID PANEL W/O CHOL/HDL RATIO
CHOLESTEROL TOTAL: 149 mg/dL (ref 100–199)
HDL: 42 mg/dL (ref >39)
LDL Calculated: 96 mg/dL (ref 0–99)
TRIGLYCERIDES: 54 mg/dL (ref 0–149)
VLDL CHOLESTEROL CAL: 11 mg/dL (ref 5–40)

## 2017-03-11 ENCOUNTER — Encounter: Payer: Self-pay | Admitting: Internal Medicine

## 2017-03-11 ENCOUNTER — Ambulatory Visit (INDEPENDENT_AMBULATORY_CARE_PROVIDER_SITE_OTHER): Payer: Self-pay | Admitting: Internal Medicine

## 2017-03-11 VITALS — BP 170/90 | HR 74 | Resp 12 | Ht 63.0 in | Wt 134.0 lb

## 2017-03-11 DIAGNOSIS — I10 Essential (primary) hypertension: Secondary | ICD-10-CM

## 2017-03-11 DIAGNOSIS — D631 Anemia in chronic kidney disease: Secondary | ICD-10-CM

## 2017-03-11 DIAGNOSIS — N186 End stage renal disease: Secondary | ICD-10-CM | POA: Insufficient documentation

## 2017-03-11 DIAGNOSIS — I152 Hypertension secondary to endocrine disorders: Secondary | ICD-10-CM | POA: Insufficient documentation

## 2017-03-11 DIAGNOSIS — I129 Hypertensive chronic kidney disease with stage 1 through stage 4 chronic kidney disease, or unspecified chronic kidney disease: Secondary | ICD-10-CM | POA: Insufficient documentation

## 2017-03-11 DIAGNOSIS — N189 Chronic kidney disease, unspecified: Secondary | ICD-10-CM

## 2017-03-11 DIAGNOSIS — IMO0002 Reserved for concepts with insufficient information to code with codable children: Secondary | ICD-10-CM

## 2017-03-11 DIAGNOSIS — E785 Hyperlipidemia, unspecified: Secondary | ICD-10-CM

## 2017-03-11 DIAGNOSIS — E1165 Type 2 diabetes mellitus with hyperglycemia: Secondary | ICD-10-CM

## 2017-03-11 DIAGNOSIS — E118 Type 2 diabetes mellitus with unspecified complications: Secondary | ICD-10-CM

## 2017-03-11 HISTORY — DX: Anemia in chronic kidney disease: D63.1

## 2017-03-11 HISTORY — DX: Essential (primary) hypertension: I10

## 2017-03-11 HISTORY — DX: Chronic kidney disease, unspecified: N18.9

## 2017-03-11 LAB — GLUCOSE, POCT (MANUAL RESULT ENTRY): POC Glucose: 83 mg/dl (ref 70–99)

## 2017-03-11 MED ORDER — ATORVASTATIN CALCIUM 20 MG PO TABS: ORAL_TABLET | ORAL | 11 refills | Status: DC

## 2017-03-11 MED ORDER — GLIPIZIDE 5 MG PO TABS: ORAL_TABLET | ORAL | 11 refills | Status: DC

## 2017-03-11 MED ORDER — METOPROLOL TARTRATE 25 MG PO TABS: 25.0000 mg | ORAL_TABLET | Freq: Two times a day (BID) | ORAL | 11 refills | Status: DC

## 2017-03-11 NOTE — Progress Notes (Signed)
   Subjective:    Patient ID: Blake Santana, male    DOB: 29-Oct-1970, 46 y.o.   MRN: [id number]  HPI   Wife states he was out of a phone and why we could not contact them--out for 1.5 month  1.  Chronic Renal Insufficiency:  Did not follow up with bp and pulse check as well as labs for kidney function as requested.  Appointment for this rescheduled twice and patient missed both.  Has not heard from Nephrology regarding referral as well.    2.  Essential hypertension:  States he is taking his lisinopril and furosemide.  Swelling is not there in the morning, but starts up as soon as he gets on his feet.    3.  DM:  Not taking Glipizide still.  Only taking Metformin ER.  A1C in June was 8.0%  4.  Anemia of chronic renal disease:  Again needs referral to nephrology.  5.  Dyslipidemia:  LDL and HDL not quite at goal.    Lipid Panel     Component Value Date/Time   CHOL 149 03/04/2017 1011   TRIG 54 03/04/2017 1011   HDL 42 03/04/2017 1011   LDLCALC 96 03/04/2017 1011    Current Meds  Medication Sig  . atorvastatin (LIPITOR) 20 MG tablet 1/2 tab by mouth daily with evening meal  . Blood Glucose Monitoring Suppl (AGAMATRIX PRESTO) w/Device KIT Check sugars twice daily  . ferrous gluconate (FERGON) 225 (27 Fe) MG tablet Take 240 mg by mouth 2 (two) times daily.  . furosemide (LASIX) 20 MG tablet Take 1 tablet (20 mg total) by mouth daily.  Marland Kitchen glucose blood (AGAMATRIX PRESTO TEST) test strip Check sugars twice daily  . lisinopril (PRINIVIL,ZESTRIL) 2.5 MG tablet Take 1 tablet (2.5 mg total) by mouth daily.  . metFORMIN (GLUCOPHAGE-XR) 500 MG 24 hr tablet 1 tab by mouth with meals twice daily    No Known Allergies   Review of Systems     Objective:   Physical Exam  Appears chronically ill Lungs:  CTA CV:  RRR with normal S1 and S2, no S3, S4 or murmur radial and DP pulses normal and Equal Abd:  S, NT, No HSM or mass, + BS LE:  Moderate pitting edema to below knees  bilaterally       Assessment & Plan:  1.  Noncompliance:  Wife with patient today and taking notes.  Strongly urged in future if there is a problem with his phone or transportation, needs to let us know so we can help get around it for his care.  2.  Hypertension:  Chronically poorly controlled.  Add Metoprolol 25 mg twice daily  3.  DM:  A1C bumped back up to 8.0% with last check in June.  Stop Metformin for now with concern for likely worsening of kidney function And start Glipizide 5 mg twice daily.  4.  CKD/anemia of chronic disease:  Unable to get patient back in for recheck of kidney function back in June/July when renal function appeared to significantly worsen.  Recheck today. Checking with orange card about nephrology appt. Requested back in June.  5.  Dyslipidemia:  LDL and HDL not quite at goal.  Increase Atorvastatin to 20 mg daily  Return in 1 week for bp and pulse check and 1 month with me

## 2017-03-12 LAB — COMPREHENSIVE METABOLIC PANEL
ALBUMIN: 3.4 g/dL — AB (ref 3.5–5.5)
ALT: 18 IU/L (ref 0–44)
AST: 20 IU/L (ref 0–40)
Albumin/Globulin Ratio: 1 — ABNORMAL LOW (ref 1.2–2.2)
Alkaline Phosphatase: 115 IU/L (ref 39–117)
BUN/Creatinine Ratio: 13 (ref 9–20)
BUN: 46 mg/dL — ABNORMAL HIGH (ref 6–24)
Bilirubin Total: 0.3 mg/dL (ref 0.0–1.2)
CALCIUM: 9 mg/dL (ref 8.7–10.2)
CO2: 20 mmol/L (ref 20–29)
CREATININE: 3.42 mg/dL — AB (ref 0.76–1.27)
Chloride: 109 mmol/L — ABNORMAL HIGH (ref 96–106)
GFR calc Af Amer: 23 mL/min/{1.73_m2} — ABNORMAL LOW (ref >59)
GFR, EST NON AFRICAN AMERICAN: 20 mL/min/{1.73_m2} — AB (ref >59)
GLOBULIN, TOTAL: 3.4 g/dL (ref 1.5–4.5)
Glucose: 78 mg/dL (ref 65–99)
Potassium: 5.3 mmol/L — ABNORMAL HIGH (ref 3.5–5.2)
SODIUM: 142 mmol/L (ref 134–144)
Total Protein: 6.8 g/dL (ref 6.0–8.5)

## 2017-03-14 ENCOUNTER — Other Ambulatory Visit: Payer: Self-pay | Admitting: Internal Medicine

## 2017-03-18 ENCOUNTER — Other Ambulatory Visit (INDEPENDENT_AMBULATORY_CARE_PROVIDER_SITE_OTHER): Payer: Self-pay

## 2017-03-18 VITALS — BP 180/102 | HR 76

## 2017-03-18 DIAGNOSIS — Z79899 Other long term (current) drug therapy: Secondary | ICD-10-CM

## 2017-03-18 DIAGNOSIS — I10 Essential (primary) hypertension: Secondary | ICD-10-CM

## 2017-03-18 NOTE — Progress Notes (Signed)
Per Dr. Amil Amen have patient increase Metoprolol to 2 twice a day for 1 week and come back in 1 week for BP and pulse check. If patient is responding to that dose will send in another Rx for higher dose

## 2017-03-19 LAB — BASIC METABOLIC PANEL
BUN/Creatinine Ratio: 16 (ref 9–20)
BUN: 52 mg/dL — AB (ref 6–24)
CALCIUM: 8.5 mg/dL — AB (ref 8.7–10.2)
CHLORIDE: 111 mmol/L — AB (ref 96–106)
CO2: 19 mmol/L — AB (ref 20–29)
Creatinine, Ser: 3.35 mg/dL — ABNORMAL HIGH (ref 0.76–1.27)
GFR calc non Af Amer: 21 mL/min/{1.73_m2} — ABNORMAL LOW (ref >59)
GFR, EST AFRICAN AMERICAN: 24 mL/min/{1.73_m2} — AB (ref >59)
Glucose: 103 mg/dL — ABNORMAL HIGH (ref 65–99)
POTASSIUM: 5.7 mmol/L — AB (ref 3.5–5.2)
Sodium: 142 mmol/L (ref 134–144)

## 2017-03-25 ENCOUNTER — Ambulatory Visit (INDEPENDENT_AMBULATORY_CARE_PROVIDER_SITE_OTHER): Payer: Self-pay

## 2017-03-25 VITALS — BP 192/92 | HR 76

## 2017-03-25 DIAGNOSIS — I10 Essential (primary) hypertension: Secondary | ICD-10-CM

## 2017-03-25 DIAGNOSIS — Z79899 Other long term (current) drug therapy: Secondary | ICD-10-CM

## 2017-03-26 LAB — BASIC METABOLIC PANEL
BUN/Creatinine Ratio: 12 (ref 9–20)
BUN: 46 mg/dL — ABNORMAL HIGH (ref 6–24)
CALCIUM: 9 mg/dL (ref 8.7–10.2)
CHLORIDE: 114 mmol/L — AB (ref 96–106)
CO2: 19 mmol/L — AB (ref 20–29)
Creatinine, Ser: 3.74 mg/dL — ABNORMAL HIGH (ref 0.76–1.27)
GFR calc non Af Amer: 18 mL/min/{1.73_m2} — ABNORMAL LOW (ref >59)
GFR, EST AFRICAN AMERICAN: 21 mL/min/{1.73_m2} — AB (ref >59)
Glucose: 77 mg/dL (ref 65–99)
POTASSIUM: 5.4 mmol/L — AB (ref 3.5–5.2)
SODIUM: 147 mmol/L — AB (ref 134–144)

## 2017-03-26 MED ORDER — AMLODIPINE BESYLATE 5 MG PO TABS: 5.0000 mg | ORAL_TABLET | Freq: Every day | ORAL | 11 refills | Status: DC

## 2017-03-26 NOTE — Progress Notes (Signed)
Per Dr. Amil Amen have patient start Amlodipine 5 mg 1 daily. Antony Madura to inform patient

## 2017-04-01 ENCOUNTER — Other Ambulatory Visit (INDEPENDENT_AMBULATORY_CARE_PROVIDER_SITE_OTHER): Payer: Self-pay

## 2017-04-01 VITALS — BP 158/82 | HR 60

## 2017-04-01 DIAGNOSIS — E1165 Type 2 diabetes mellitus with hyperglycemia: Secondary | ICD-10-CM

## 2017-04-01 DIAGNOSIS — E118 Type 2 diabetes mellitus with unspecified complications: Secondary | ICD-10-CM

## 2017-04-01 DIAGNOSIS — N179 Acute kidney failure, unspecified: Secondary | ICD-10-CM

## 2017-04-01 DIAGNOSIS — N189 Chronic kidney disease, unspecified: Secondary | ICD-10-CM

## 2017-04-01 DIAGNOSIS — Z79899 Other long term (current) drug therapy: Secondary | ICD-10-CM

## 2017-04-01 DIAGNOSIS — IMO0002 Reserved for concepts with insufficient information to code with codable children: Secondary | ICD-10-CM

## 2017-04-01 LAB — POCT URINALYSIS DIPSTICK
BILIRUBIN UA: NEGATIVE
Glucose, UA: NEGATIVE
KETONES UA: 5
LEUKOCYTES UA: NEGATIVE
Nitrite, UA: NEGATIVE
PH UA: 5 (ref 5.0–8.0)
SPEC GRAV UA: 1.02 (ref 1.010–1.025)
Urobilinogen, UA: 0.2 E.U./dL

## 2017-04-01 NOTE — Progress Notes (Signed)
Patient here today to follow up with labs. Worsening BUN and creatinine in a patient with long standing previously poorly controlled DM,peripheral edema with microalbuminuria and gradually elevating bp starting in December. Placed on Lisinopril in November for microalbuminuria. Has started and stopped meds throughout his care with loss of follow up. Referred to Nephrology in June when kidney function and bp with continuing significant change.   We have not been able to get him into nephrologist--still no definitive answer from [facility].   Patient has been given financial assistance papers for [facility].   Urine today dipsticked +1 hemoglobin and significant protein.  We have had some with +1 hemoglobin that in past are negative on microscopic for blood/RBCs Sending for Microscopic to [facility] as well.  He and his wife state they did not get message on 03/19/2017 to increase Furosemide to 40 mg, he continues on just 20 mg, though speaking with staff, both he and his wife were informed the day after labs returned and when he came back in the following week, staff reviewed his meds with him and he stated at the time he was taking the furosemide 40 mg daily.Marland Kitchen  He has been taking the Amlodipine 5 mg daily for about 1 week, swelling in his legs and now scrotal has increased in past 3 days maybe.    He is to increase his Amlodipine to 10 mg daily, increase Furosemide to 40 mg in the morning Schedule for renal ultrasound in next couple of days. He needs to get started on Sodium bicarbonate 650 mg 3 times daily today Repeat BMP Monday, with Serum protein immunoelectrophoresis (check cost) ANA, CRP, HIV, Hep B SAg, Core Ab, SAb, Hep C antibody, Serum C3 and C4 (check cost)

## 2017-04-02 ENCOUNTER — Encounter (HOSPITAL_COMMUNITY): Payer: Self-pay | Admitting: *Deleted

## 2017-04-02 ENCOUNTER — Telehealth: Payer: Self-pay

## 2017-04-02 ENCOUNTER — Emergency Department (HOSPITAL_COMMUNITY): Payer: Medicaid Other

## 2017-04-02 DIAGNOSIS — Z79899 Other long term (current) drug therapy: Secondary | ICD-10-CM

## 2017-04-02 DIAGNOSIS — E785 Hyperlipidemia, unspecified: Secondary | ICD-10-CM | POA: Diagnosis present

## 2017-04-02 DIAGNOSIS — E8809 Other disorders of plasma-protein metabolism, not elsewhere classified: Secondary | ICD-10-CM | POA: Diagnosis present

## 2017-04-02 DIAGNOSIS — D509 Iron deficiency anemia, unspecified: Secondary | ICD-10-CM | POA: Diagnosis present

## 2017-04-02 DIAGNOSIS — I5031 Acute diastolic (congestive) heart failure: Secondary | ICD-10-CM | POA: Diagnosis present

## 2017-04-02 DIAGNOSIS — D631 Anemia in chronic kidney disease: Secondary | ICD-10-CM | POA: Diagnosis present

## 2017-04-02 DIAGNOSIS — Z7984 Long term (current) use of oral hypoglycemic drugs: Secondary | ICD-10-CM

## 2017-04-02 DIAGNOSIS — N185 Chronic kidney disease, stage 5: Secondary | ICD-10-CM | POA: Diagnosis present

## 2017-04-02 DIAGNOSIS — E1122 Type 2 diabetes mellitus with diabetic chronic kidney disease: Secondary | ICD-10-CM | POA: Diagnosis present

## 2017-04-02 DIAGNOSIS — N049 Nephrotic syndrome with unspecified morphologic changes: Secondary | ICD-10-CM | POA: Diagnosis present

## 2017-04-02 DIAGNOSIS — I132 Hypertensive heart and chronic kidney disease with heart failure and with stage 5 chronic kidney disease, or end stage renal disease: Principal | ICD-10-CM | POA: Diagnosis present

## 2017-04-02 DIAGNOSIS — Z87891 Personal history of nicotine dependence: Secondary | ICD-10-CM

## 2017-04-02 LAB — BASIC METABOLIC PANEL
ANION GAP: 8 (ref 5–15)
BUN / CREAT RATIO: 13 (ref 9–20)
BUN: 61 mg/dL — AB (ref 6–24)
BUN: 72 mg/dL — ABNORMAL HIGH (ref 6–20)
CO2: 16 mmol/L — ABNORMAL LOW (ref 20–29)
CO2: 19 mmol/L — ABNORMAL LOW (ref 22–32)
CREATININE: 4.67 mg/dL — AB (ref 0.76–1.27)
Calcium: 9 mg/dL (ref 8.7–10.2)
Calcium: 8.5 mg/dL — ABNORMAL LOW (ref 8.9–10.3)
Chloride: 110 mmol/L — ABNORMAL HIGH (ref 96–106)
Chloride: 109 mmol/L (ref 101–111)
Creatinine, Ser: 4.88 mg/dL — ABNORMAL HIGH (ref 0.61–1.24)
GFR calc Af Amer: 15 mL/min — ABNORMAL LOW (ref >60)
GFR, EST AFRICAN AMERICAN: 16 mL/min/{1.73_m2} — AB (ref >59)
GFR, EST NON AFRICAN AMERICAN: 14 mL/min/{1.73_m2} — AB (ref >59)
GFR, EST NON AFRICAN AMERICAN: 13 mL/min — AB (ref >60)
GLUCOSE: 46 mg/dL — AB (ref 65–99)
Glucose, Bld: 146 mg/dL — ABNORMAL HIGH (ref 65–99)
POTASSIUM: 5.2 mmol/L — AB (ref 3.5–5.1)
Potassium: 5.5 mmol/L — ABNORMAL HIGH (ref 3.5–5.2)
SODIUM: 142 mmol/L (ref 134–144)
SODIUM: 136 mmol/L (ref 135–145)

## 2017-04-02 LAB — CBC
HEMATOCRIT: 26.7 % — AB (ref 39.0–52.0)
HEMOGLOBIN: 8.6 g/dL — AB (ref 13.0–17.0)
MCH: 27.5 pg (ref 26.0–34.0)
MCHC: 32.2 g/dL (ref 30.0–36.0)
MCV: 85.3 fL (ref 78.0–100.0)
Platelets: 218 10*3/uL (ref 150–400)
RBC: 3.13 MIL/uL — AB (ref 4.22–5.81)
RDW: 14.2 % (ref 11.5–15.5)
WBC: 7.7 10*3/uL (ref 4.0–10.5)

## 2017-04-02 LAB — URINALYSIS, COMPLETE
BILIRUBIN UA: NEGATIVE
Glucose, UA: NEGATIVE
KETONES UA: NEGATIVE
LEUKOCYTES UA: NEGATIVE
Nitrite, UA: NEGATIVE
PH UA: 5 (ref 5.0–7.5)
SPEC GRAV UA: 1.016 (ref 1.005–1.030)
UUROB: 0.2 mg/dL (ref 0.2–1.0)

## 2017-04-02 LAB — PHOSPHORUS: PHOSPHORUS: 4.6 mg/dL — AB (ref 2.5–4.5)

## 2017-04-02 LAB — MICROSCOPIC EXAMINATION: Casts: NONE SEEN /lpf

## 2017-04-02 LAB — SPECIMEN STATUS REPORT

## 2017-04-02 LAB — I-STAT TROPONIN, ED: Troponin i, poc: 0.02 ng/mL (ref 0.00–0.08)

## 2017-04-02 LAB — MICROALBUMIN / CREATININE URINE RATIO
CREATININE, UR: 101.6 mg/dL
Microalb/Creat Ratio: 2799.3 mg/g creat — ABNORMAL HIGH (ref 0.0–30.0)
Microalbumin, Urine: 2844.1 ug/mL

## 2017-04-02 LAB — CBG MONITORING, ED: GLUCOSE-CAPILLARY: 133 mg/dL — AB (ref 65–99)

## 2017-04-02 NOTE — Telephone Encounter (Signed)
Antony Madura informed patient with all details about the appointment on 04/02/17 @ 5:12 p.m

## 2017-04-02 NOTE — Telephone Encounter (Signed)
Spoke with scheduling. Appointment scheduled for 04/08/17 at [place] at 1:30 pm. Patient to arrive @ 1:15 pm with a full bladder. Blake Santana to call patient and notify him.

## 2017-04-02 NOTE — ED Triage Notes (Signed)
Used translator line, pt reports normally having swelling to his feet but it has now increased to legs and pelvic area. States this am having increase in fluid, took his lasix and norvasc, had episode of n/v and has nauseated all day. Denies chest pain but states he feels "agitated" and mild sob. No resp distress is noted at triage.

## 2017-04-03 ENCOUNTER — Inpatient Hospital Stay (HOSPITAL_COMMUNITY): Payer: Medicaid Other

## 2017-04-03 ENCOUNTER — Inpatient Hospital Stay (HOSPITAL_COMMUNITY)
Admission: EM | Admit: 2017-04-03 | Discharge: 2017-04-08 | DRG: 291 | Disposition: A | Discharge status: Home / Self Care | Payer: Medicaid Other | Attending: Family Medicine | Admitting: Family Medicine

## 2017-04-03 ENCOUNTER — Encounter (HOSPITAL_COMMUNITY): Payer: Self-pay | Admitting: Internal Medicine

## 2017-04-03 DIAGNOSIS — R0601 Orthopnea: Secondary | ICD-10-CM

## 2017-04-03 DIAGNOSIS — Z7984 Long term (current) use of oral hypoglycemic drugs: Secondary | ICD-10-CM | POA: Diagnosis not present

## 2017-04-03 DIAGNOSIS — N289 Disorder of kidney and ureter, unspecified: Secondary | ICD-10-CM

## 2017-04-03 DIAGNOSIS — R0600 Dyspnea, unspecified: Secondary | ICD-10-CM | POA: Diagnosis present

## 2017-04-03 DIAGNOSIS — I10 Essential (primary) hypertension: Secondary | ICD-10-CM | POA: Diagnosis present

## 2017-04-03 DIAGNOSIS — R0609 Other forms of dyspnea: Secondary | ICD-10-CM

## 2017-04-03 DIAGNOSIS — E1122 Type 2 diabetes mellitus with diabetic chronic kidney disease: Secondary | ICD-10-CM | POA: Diagnosis present

## 2017-04-03 DIAGNOSIS — E785 Hyperlipidemia, unspecified: Secondary | ICD-10-CM | POA: Diagnosis present

## 2017-04-03 DIAGNOSIS — N179 Acute kidney failure, unspecified: Secondary | ICD-10-CM

## 2017-04-03 DIAGNOSIS — N049 Nephrotic syndrome with unspecified morphologic changes: Secondary | ICD-10-CM | POA: Diagnosis present

## 2017-04-03 DIAGNOSIS — D631 Anemia in chronic kidney disease: Secondary | ICD-10-CM | POA: Diagnosis present

## 2017-04-03 DIAGNOSIS — E1165 Type 2 diabetes mellitus with hyperglycemia: Secondary | ICD-10-CM

## 2017-04-03 DIAGNOSIS — I132 Hypertensive heart and chronic kidney disease with heart failure and with stage 5 chronic kidney disease, or end stage renal disease: Secondary | ICD-10-CM | POA: Diagnosis present

## 2017-04-03 DIAGNOSIS — I5031 Acute diastolic (congestive) heart failure: Secondary | ICD-10-CM | POA: Diagnosis present

## 2017-04-03 DIAGNOSIS — R609 Edema, unspecified: Secondary | ICD-10-CM

## 2017-04-03 DIAGNOSIS — N186 End stage renal disease: Secondary | ICD-10-CM | POA: Diagnosis present

## 2017-04-03 DIAGNOSIS — E8809 Other disorders of plasma-protein metabolism, not elsewhere classified: Secondary | ICD-10-CM | POA: Diagnosis present

## 2017-04-03 DIAGNOSIS — I340 Nonrheumatic mitral (valve) insufficiency: Secondary | ICD-10-CM

## 2017-04-03 DIAGNOSIS — IMO0002 Reserved for concepts with insufficient information to code with codable children: Secondary | ICD-10-CM

## 2017-04-03 DIAGNOSIS — N189 Chronic kidney disease, unspecified: Secondary | ICD-10-CM

## 2017-04-03 DIAGNOSIS — Z87891 Personal history of nicotine dependence: Secondary | ICD-10-CM | POA: Diagnosis not present

## 2017-04-03 DIAGNOSIS — N185 Chronic kidney disease, stage 5: Secondary | ICD-10-CM | POA: Diagnosis present

## 2017-04-03 DIAGNOSIS — Z79899 Other long term (current) drug therapy: Secondary | ICD-10-CM | POA: Diagnosis not present

## 2017-04-03 DIAGNOSIS — I152 Hypertension secondary to endocrine disorders: Secondary | ICD-10-CM | POA: Diagnosis present

## 2017-04-03 DIAGNOSIS — E118 Type 2 diabetes mellitus with unspecified complications: Secondary | ICD-10-CM | POA: Diagnosis present

## 2017-04-03 DIAGNOSIS — R601 Generalized edema: Secondary | ICD-10-CM | POA: Diagnosis present

## 2017-04-03 DIAGNOSIS — R600 Localized edema: Secondary | ICD-10-CM

## 2017-04-03 DIAGNOSIS — E1129 Type 2 diabetes mellitus with other diabetic kidney complication: Secondary | ICD-10-CM | POA: Diagnosis present

## 2017-04-03 DIAGNOSIS — I509 Heart failure, unspecified: Secondary | ICD-10-CM

## 2017-04-03 DIAGNOSIS — D509 Iron deficiency anemia, unspecified: Secondary | ICD-10-CM | POA: Diagnosis present

## 2017-04-03 LAB — ECHOCARDIOGRAM COMPLETE
CHL CUP DOP CALC LVOT VTI: 25.5 cm
E decel time: 229 msec
E/e' ratio: 13.38
FS: 24 % — AB (ref 28–44)
IVS/LV PW RATIO, ED: 1.09
LA diam index: 2.86 cm/m2
LA vol A4C: 80.7 ml
LA vol index: 51 mL/m2
LASIZE: 48 mm
LAVOL: 85.6 mL
LDCA: 2.54 cm2
LEFT ATRIUM END SYS DIAM: 48 mm
LV PW d: 10.2 mm — AB (ref 0.6–1.1)
LVEEAVG: 13.38
LVEEMED: 13.38
LVELAT: 7.4 cm/s
LVOT diameter: 18 mm
LVOT peak grad rest: 5 mmHg
LVOTPV: 112 cm/s
LVOTSV: 65 mL
MV Dec: 229
MV pk A vel: 47.5 m/s
MVPG: 4 mmHg
MVPKEVEL: 99 m/s
RV LATERAL S' VELOCITY: 10.6 cm/s
TAPSE: 24.9 mm
TDI e' lateral: 7.4
TDI e' medial: 5.11

## 2017-04-03 LAB — HIV ANTIBODY (ROUTINE TESTING W REFLEX): HIV SCREEN 4TH GENERATION: NONREACTIVE

## 2017-04-03 LAB — HEPATIC FUNCTION PANEL
ALBUMIN: 2.8 g/dL — AB (ref 3.5–5.0)
ALT: 30 U/L (ref 17–63)
AST: 20 U/L (ref 15–41)
Alkaline Phosphatase: 115 U/L (ref 38–126)
TOTAL PROTEIN: 6.7 g/dL (ref 6.5–8.1)
Total Bilirubin: 0.4 mg/dL (ref 0.3–1.2)

## 2017-04-03 LAB — PHOSPHORUS: PHOSPHORUS: 5 mg/dL — AB (ref 2.5–4.6)

## 2017-04-03 LAB — CBC
HEMATOCRIT: 25.8 % — AB (ref 39.0–52.0)
HEMOGLOBIN: 8.5 g/dL — AB (ref 13.0–17.0)
MCH: 27.6 pg (ref 26.0–34.0)
MCHC: 32.9 g/dL (ref 30.0–36.0)
MCV: 83.8 fL (ref 78.0–100.0)
Platelets: 211 10*3/uL (ref 150–400)
RBC: 3.08 MIL/uL — ABNORMAL LOW (ref 4.22–5.81)
RDW: 14.1 % (ref 11.5–15.5)
WBC: 7.3 10*3/uL (ref 4.0–10.5)

## 2017-04-03 LAB — FERRITIN: FERRITIN: 50 ng/mL (ref 24–336)

## 2017-04-03 LAB — URINALYSIS, ROUTINE W REFLEX MICROSCOPIC
Bilirubin Urine: NEGATIVE
GLUCOSE, UA: 50 mg/dL — AB
KETONES UR: NEGATIVE mg/dL
Leukocytes, UA: NEGATIVE
NITRITE: NEGATIVE
PH: 5 (ref 5.0–8.0)
Specific Gravity, Urine: 1.011 (ref 1.005–1.030)

## 2017-04-03 LAB — BASIC METABOLIC PANEL
ANION GAP: 8 (ref 5–15)
BUN: 71 mg/dL — ABNORMAL HIGH (ref 6–20)
CHLORIDE: 112 mmol/L — AB (ref 101–111)
CO2: 18 mmol/L — ABNORMAL LOW (ref 22–32)
Calcium: 8.6 mg/dL — ABNORMAL LOW (ref 8.9–10.3)
Creatinine, Ser: 4.7 mg/dL — ABNORMAL HIGH (ref 0.61–1.24)
GFR calc Af Amer: 16 mL/min — ABNORMAL LOW (ref >60)
GFR, EST NON AFRICAN AMERICAN: 13 mL/min — AB (ref >60)
GLUCOSE: 110 mg/dL — AB (ref 65–99)
POTASSIUM: 4.9 mmol/L (ref 3.5–5.1)
SODIUM: 138 mmol/L (ref 135–145)

## 2017-04-03 LAB — RETICULOCYTES
RBC.: 3.12 MIL/uL — AB (ref 4.22–5.81)
RETIC COUNT ABSOLUTE: 21.8 10*3/uL (ref 19.0–186.0)
RETIC CT PCT: 0.7 % (ref 0.4–3.1)

## 2017-04-03 LAB — IRON AND TIBC
Iron: 31 ug/dL — ABNORMAL LOW (ref 45–182)
Saturation Ratios: 10 % — ABNORMAL LOW (ref 17.9–39.5)
TIBC: 312 ug/dL (ref 250–450)
UIBC: 281 ug/dL

## 2017-04-03 LAB — TSH: TSH: 2.963 u[IU]/mL (ref 0.350–4.500)

## 2017-04-03 LAB — GLUCOSE, CAPILLARY
GLUCOSE-CAPILLARY: 92 mg/dL (ref 65–99)
Glucose-Capillary: 178 mg/dL — ABNORMAL HIGH (ref 65–99)
Glucose-Capillary: 143 mg/dL — ABNORMAL HIGH (ref 65–99)

## 2017-04-03 LAB — VITAMIN B12: Vitamin B-12: 6985 pg/mL — ABNORMAL HIGH (ref 180–914)

## 2017-04-03 LAB — BRAIN NATRIURETIC PEPTIDE: B Natriuretic Peptide: 1294.9 pg/mL — ABNORMAL HIGH (ref 0.0–100.0)

## 2017-04-03 LAB — TROPONIN I: Troponin I: <0.03 ng/mL (ref <0.03)

## 2017-04-03 LAB — FOLATE: Folate: 15.3 ng/mL (ref >5.9)

## 2017-04-03 LAB — CREATININE, URINE, RANDOM: Creatinine, Urine: 87.51 mg/dL

## 2017-04-03 LAB — SODIUM, URINE, RANDOM: Sodium, Ur: 73 mmol/L

## 2017-04-03 MED ORDER — ACETAMINOPHEN 650 MG RE SUPP: 650.0000 mg | Freq: Four times a day (QID) | RECTAL | Status: DC | PRN

## 2017-04-03 MED ORDER — FUROSEMIDE 10 MG/ML IJ SOLN
60.0000 mg | Freq: Two times a day (BID) | INTRAMUSCULAR | Status: DC
  Administered 2017-04-03: 60 mg via INTRAVENOUS
  Filled 2017-04-03: qty 6

## 2017-04-03 MED ORDER — FUROSEMIDE 10 MG/ML IJ SOLN
120.0000 mg | Freq: Once | INTRAVENOUS | Status: AC
  Administered 2017-04-03: 120 mg via INTRAVENOUS
  Filled 2017-04-03: qty 12

## 2017-04-03 MED ORDER — FERROUS GLUCONATE 324 (38 FE) MG PO TABS
324.0000 mg | ORAL_TABLET | Freq: Two times a day (BID) | ORAL | Status: DC
  Administered 2017-04-03 – 2017-04-08 (×11): 324 mg via ORAL
  Filled 2017-04-03 (×11): qty 1

## 2017-04-03 MED ORDER — ATORVASTATIN CALCIUM 20 MG PO TABS
20.0000 mg | ORAL_TABLET | Freq: Every day | ORAL | Status: DC
  Administered 2017-04-03 – 2017-04-07 (×5): 20 mg via ORAL
  Filled 2017-04-03 (×5): qty 1

## 2017-04-03 MED ORDER — ONDANSETRON HCL 4 MG/2ML IJ SOLN: 4.0000 mg | Freq: Four times a day (QID) | INTRAMUSCULAR | Status: DC | PRN

## 2017-04-03 MED ORDER — ACETAMINOPHEN 325 MG PO TABS: 650.0000 mg | ORAL_TABLET | Freq: Four times a day (QID) | ORAL | Status: DC | PRN

## 2017-04-03 MED ORDER — METOPROLOL TARTRATE 25 MG PO TABS
25.0000 mg | ORAL_TABLET | Freq: Two times a day (BID) | ORAL | Status: DC
  Administered 2017-04-03 – 2017-04-08 (×11): 25 mg via ORAL
  Filled 2017-04-03 (×11): qty 1

## 2017-04-03 MED ORDER — FUROSEMIDE 10 MG/ML IJ SOLN
80.0000 mg | Freq: Two times a day (BID) | INTRAMUSCULAR | Status: DC
  Administered 2017-04-03 – 2017-04-05 (×4): 80 mg via INTRAVENOUS
  Filled 2017-04-03 (×4): qty 8

## 2017-04-03 MED ORDER — NITROGLYCERIN 2 % TD OINT
1.0000 [in_us] | TOPICAL_OINTMENT | Freq: Once | TRANSDERMAL | Status: AC
  Administered 2017-04-03: 1 [in_us] via TOPICAL
  Filled 2017-04-03: qty 1

## 2017-04-03 MED ORDER — ONDANSETRON HCL 4 MG PO TABS: 4.0000 mg | ORAL_TABLET | Freq: Four times a day (QID) | ORAL | Status: DC | PRN

## 2017-04-03 MED ORDER — INSULIN ASPART 100 UNIT/ML ~~LOC~~ SOLN
0.0000 [IU] | Freq: Three times a day (TID) | SUBCUTANEOUS | Status: DC
  Administered 2017-04-03: 2 [IU] via SUBCUTANEOUS
  Administered 2017-04-04: 1 [IU] via SUBCUTANEOUS
  Administered 2017-04-04: 2 [IU] via SUBCUTANEOUS
  Administered 2017-04-05: 3 [IU] via SUBCUTANEOUS

## 2017-04-03 MED ORDER — HEPARIN SODIUM (PORCINE) 5000 UNIT/ML IJ SOLN
5000.0000 [IU] | Freq: Three times a day (TID) | INTRAMUSCULAR | Status: DC
  Administered 2017-04-03 – 2017-04-06 (×9): 5000 [IU] via SUBCUTANEOUS
  Filled 2017-04-03 (×9): qty 1

## 2017-04-03 MED ORDER — AMLODIPINE BESYLATE 5 MG PO TABS
10.0000 mg | ORAL_TABLET | Freq: Every day | ORAL | Status: DC
  Administered 2017-04-03 – 2017-04-08 (×6): 10 mg via ORAL
  Filled 2017-04-03 (×6): qty 2

## 2017-04-03 NOTE — ED Provider Notes (Signed)
[facility] DEPT Provider Note   CSN: [id number] Arrival date & time: 04/02/17  1836     History   Chief Complaint Chief Complaint  Patient presents with  . Emesis  . Shortness of Breath    HPI Blake Santana is a 46 y.o. male.  The history is provided by the patient.  Shortness of Breath  This is a new (DOE) problem. Duration: several weeks. The problem occurs continuously.The problem has been gradually worsening. Associated symptoms include cough (dry), orthopnea, chest pain (intermittent; exertional) and leg swelling (up to pelvis; worsened over the last few days). Pertinent negatives include no fever and no abdominal pain. Associated medical issues comments: CKD on lasix.    Past Medical History:  Diagnosis Date  . Anemia of chronic kidney failure 03/11/2017  . Chronic kidney disease 03/11/2017  . Diabetes mellitus without complication ([place]) 5009  . Edema 01/14/2017  . Hypertension 03/11/2017  . Iron deficiency anemia 06/18/2016    Patient Active Problem List   Diagnosis Date Noted  . Acute CHF (congestive heart failure) ([place]) 04/03/2017  . Anasarca 04/03/2017  . Anemia of chronic kidney failure 03/11/2017  . Hypertension 03/11/2017  . Chronic kidney disease 03/11/2017  . Edema 01/14/2017  . Dyslipidemia 10/15/2016  . Diabetic retinopathy ([place]) 10/15/2016  . Microalbuminuria 06/18/2016  . Iron deficiency anemia 06/18/2016  . Uncontrolled type 2 diabetes mellitus with complication, without long-term current use of insulin ([place]) 10/20/2001    History reviewed. No pertinent surgical history.     Home Medications    Prior to Admission medications   Medication Sig Start Date End Date Taking? Authorizing Provider  amLODipine (NORVASC) 5 MG tablet Take 1 tablet (5 mg total) by mouth daily. Patient taking differently: Take 10 mg by mouth daily.  03/26/17  Yes Mack Hook, MD  atorvastatin (LIPITOR) 20 MG tablet 1 tab by mouth daily with evening meal  03/11/17  Yes Mack Hook, MD  Blood Glucose Monitoring Suppl (AGAMATRIX PRESTO) w/Device KIT Check sugars twice daily 06/04/16  Yes Mack Hook, MD  ferrous gluconate (FERGON) 225 (27 Fe) MG tablet Take 240 mg by mouth 2 (two) times daily.   Yes [provider]  furosemide (LASIX) 20 MG tablet Take 1 tablet (20 mg total) by mouth daily. Patient taking differently: Take 40 mg by mouth daily.  01/14/17  Yes Mack Hook, MD  glipiZIDE (GLUCOTROL) 5 MG tablet 1 tab by mouth twice daily with meals Patient taking differently: Take 10 mg by mouth daily before breakfast.  03/11/17  Yes Mack Hook, MD  glucose blood (AGAMATRIX PRESTO TEST) test strip Check sugars twice daily 06/04/16  Yes Mack Hook, MD  metoprolol tartrate (LOPRESSOR) 25 MG tablet Take 1 tablet (25 mg total) by mouth 2 (two) times daily. 03/11/17  Yes Mack Hook, MD    Family History Family History  Problem Relation Age of Onset  . Diabetes Mother   . Kidney disease Mother        renal failure--diabetes, cause of death  . Hypertension Mother   . Diabetes Father   . Kidney disease Father        Cause of death:  kidney failure form diabetes  . Alcohol abuse Father   . Diabetes Sister     Social History Social History  Substance Use Topics  . Smoking status: Former Research scientist (life sciences)  . Smokeless tobacco: Never Used  . Alcohol use No     Comment: Used to drink too much once weekly, but nothing  for 1 year.     Allergies   Patient has no known allergies.   Review of Systems Review of Systems  Constitutional: Negative for fever.  Respiratory: Positive for cough (dry) and shortness of breath.   Cardiovascular: Positive for chest pain (intermittent; exertional), orthopnea and leg swelling (up to pelvis; worsened over the last few days).  Gastrointestinal: Negative for abdominal pain.   All other systems are reviewed and are negative for acute change except as noted in the  HPI   Physical Exam Updated Vital Signs BP (!) 150/73 (BP Location: Right Arm)   Pulse 66   Temp 97.8 F (36.6 C) (Oral)   Resp 14   Wt 65.4 kg (144 lb 1.6 oz)   SpO2 100%   BMI 25.53 kg/m   Physical Exam  Constitutional: He is oriented to person, place, and time. He appears well-developed and well-nourished. No distress.  HENT:  Head: Normocephalic and atraumatic.  Nose: Nose normal.  Eyes: Pupils are equal, round, and reactive to light. Conjunctivae and EOM are normal. Right eye exhibits no discharge. Left eye exhibits no discharge. No scleral icterus.  Neck: Normal range of motion. Neck supple.  Cardiovascular: Normal rate and regular rhythm.  Exam reveals no gallop and no friction rub.   No murmur heard. Pulmonary/Chest: Effort normal and breath sounds normal. No stridor. No respiratory distress. He has no rales.  Abdominal: Soft. He exhibits no distension. There is no tenderness.  Genitourinary:  Genitourinary Comments: Penile edema  Musculoskeletal: He exhibits no edema or tenderness.  3+ BLE edema up to knee,   Neurological: He is alert and oriented to person, place, and time.  Skin: Skin is warm and dry. No rash noted. He is not diaphoretic. No erythema.  Psychiatric: He has a normal mood and affect.  Vitals reviewed.    ED Treatments / Results  Labs (all labs ordered are listed, but only abnormal results are displayed) Labs Reviewed  BASIC METABOLIC PANEL - Abnormal; Notable for the following:       Result Value   Potassium 5.2 (*)    CO2 19 (*)    Glucose, Bld 146 (*)    BUN 72 (*)    Creatinine, Ser 4.88 (*)    Calcium 8.5 (*)    GFR calc non Af Amer 13 (*)    GFR calc Af Amer 15 (*)    All other components within normal limits  CBC - Abnormal; Notable for the following:    RBC 3.13 (*)    Hemoglobin 8.6 (*)    HCT 26.7 (*)    All other components within normal limits  HEPATIC FUNCTION PANEL - Abnormal; Notable for the following:    Albumin 2.8 (*)     Bilirubin, Direct <0.1 (*)    All other components within normal limits  BRAIN NATRIURETIC PEPTIDE - Abnormal; Notable for the following:    B Natriuretic Peptide 1,294.9 (*)    All other components within normal limits  URINALYSIS, ROUTINE W REFLEX MICROSCOPIC - Abnormal; Notable for the following:    APPearance HAZY (*)    Glucose, UA 50 (*)    Hgb urine dipstick SMALL (*)    Protein, ur >=300 (*)    Bacteria, UA RARE (*)    Squamous Epithelial / LPF 0-5 (*)    All other components within normal limits  BASIC METABOLIC PANEL - Abnormal; Notable for the following:    Chloride 112 (*)    CO2 18 (*)  Glucose, Bld 110 (*)    BUN 71 (*)    Creatinine, Ser 4.70 (*)    Calcium 8.6 (*)    GFR calc non Af Amer 13 (*)    GFR calc Af Amer 16 (*)    All other components within normal limits  CBC - Abnormal; Notable for the following:    RBC 3.08 (*)    Hemoglobin 8.5 (*)    HCT 25.8 (*)    All other components within normal limits  PHOSPHORUS - Abnormal; Notable for the following:    Phosphorus 5.0 (*)    All other components within normal limits  CBG MONITORING, ED - Abnormal; Notable for the following:    Glucose-Capillary 133 (*)    All other components within normal limits  SODIUM, URINE, RANDOM  CREATININE, URINE, RANDOM  TSH  TROPONIN I  HIV ANTIBODY (ROUTINE TESTING)  TROPONIN I  TROPONIN I  HEPATITIS PANEL, ACUTE  VITAMIN B12  FOLATE  IRON AND TIBC  FERRITIN  RETICULOCYTES  I-STAT TROPONIN, ED    EKG  EKG Interpretation  Date/Time:  Friday April 02 2017 18:39:31 EDT Ventricular Rate:  65 PR Interval:  140 QRS Duration: 80 QT Interval:  428 QTC Calculation: 445 R Axis:   127 Text Interpretation:   Poor data quality, interpretation may be adversely affected Normal sinus rhythm Possible Left atrial enlargement Left posterior fascicular block Abnormal ECG Otherwise no significant change Confirmed by Addison Lank [phone]) on 04/03/2017 1:43:28 AM        Radiology Dg Chest 2 View  Result Date: 04/02/2017 CLINICAL DATA:  Swelling of the feet, legs and pelvic area. EXAM: CHEST  2 VIEW COMPARISON:  None. FINDINGS: Cardiomegaly is noted without overt pulmonary edema. No pneumonic consolidation, effusion or pneumothorax. Streaky atelectasis and/or scarring is noted in the right middle lobe and left lung base. The aorta is not aneurysmal. No acute nor suspicious osseous abnormality. IMPRESSION: Cardiomegaly without acute pulmonary abnormality. Streaky atelectasis and/or scarring is seen in the right middle lobe and left lung base. Electronically Signed   By: Ashley Royalty M.D.   On: 04/02/2017 20:04    Procedures Procedures (including critical care time)  Medications Ordered in ED Medications  amLODipine (NORVASC) tablet 10 mg (not administered)  atorvastatin (LIPITOR) tablet 20 mg (not administered)  metoprolol tartrate (LOPRESSOR) tablet 25 mg (not administered)  ferrous gluconate (FERGON) tablet 324 mg (240 mg Oral Not Given 04/03/17 0505)  furosemide (LASIX) injection 60 mg (not administered)  acetaminophen (TYLENOL) tablet 650 mg (not administered)    Or  acetaminophen (TYLENOL) suppository 650 mg (not administered)  ondansetron (ZOFRAN) tablet 4 mg (not administered)    Or  ondansetron (ZOFRAN) injection 4 mg (not administered)  insulin aspart (novoLOG) injection 0-9 Units (not administered)  heparin injection 5,000 Units (5,000 Units Subcutaneous Given 04/03/17 0607)  nitroGLYCERIN (NITROGLYN) 2 % ointment 1 inch (1 inch Topical Given 04/03/17 0243)  furosemide (LASIX) 120 mg in dextrose 5 % 50 mL IVPB (0 mg Intravenous Stopped 04/03/17 0406)     Initial Impression / Assessment and Plan / ED Course  I have reviewed the triage vital signs and the nursing notes.  Pertinent labs & imaging results that were available during my care of the patient were reviewed by me and considered in my medical decision making (see chart for details).      Presentation is concerning for new onset heart failure given the dyspnea on exertion and an orthopnea with associated exertional chest  pain. Patient does have a history of CK D on Lasix noted to have worsening renal function over the past several weeks. Patient is also uremic with no altered mental status. Chest x-ray without evidence of pulmonary edema but she does have basilar atelectasis. No recent infectious symptoms. EKG without acute ischemic changes. Initial troponin negative.  Will be admitted to hospitalist service for further workup and management  Final Clinical Impressions(s) / ED Diagnoses   Final diagnoses:  Acute on chronic renal insufficiency  Dyspnea on exertion  Orthopnea  Peripheral edema      Briggitte Boline, Grayce Sessions, MD 04/03/17 0710

## 2017-04-03 NOTE — H&P (Signed)
History and Physical    Blake Santana TTS:[id number] DOB: 13-Jul-1971 DOA: 04/03/2017  PCP: Mack Hook, MD  Patient coming from: Home.  Patent attorney used.  Chief Complaint: Shortness of breath and chest pressure.  HPI: Blake Santana is a 46 y.o. male with history of hypertension, diabetes mellitus, chronic kidney disease and anemia presents to the ER because of increasing shortness of breath and chest pressure on exertion over the last 3 days with increasing lower extremity edema. Patient states he has been noticing increasing lower extremity edema over the last 1 month and over the last 3 days has noticed increasing exertional symptoms. His symptoms improve on sitting. Denies any productive cough fever or chills. Has been compliant with his medications.   ED Course: In the ER on exam patient has lower extremity swelling up to the groin. Chest x-ray shows cardiomegaly otherwise unremarkable. Labs revealed hypoalbuminemia and chronic kidney disease with anemia comparable to the old labs. UA still pending. Patient was given Lasix 120 mg IV and admitted for anasarca.  Review of Systems: As per HPI, rest all negative.   Past Medical History:  Diagnosis Date  . Anemia of chronic kidney failure 03/11/2017  . Chronic kidney disease 03/11/2017  . Diabetes mellitus without complication ([place]) 0923  . Edema 01/14/2017  . Hypertension 03/11/2017  . Iron deficiency anemia 06/18/2016    History reviewed. No pertinent surgical history.   reports that he has quit smoking. He has never used smokeless tobacco. He reports that he does not drink alcohol or use drugs.  No Known Allergies  Family History  Problem Relation Age of Onset  . Diabetes Mother   . Kidney disease Mother        renal failure--diabetes, cause of death  . Hypertension Mother   . Diabetes Father   . Kidney disease Father        Cause of death:  kidney failure form diabetes  . Alcohol abuse Father   .  Diabetes Sister     Prior to Admission medications   Medication Sig Start Date End Date Taking? Authorizing Provider  amLODipine (NORVASC) 5 MG tablet Take 1 tablet (5 mg total) by mouth daily. Patient taking differently: Take 10 mg by mouth daily.  03/26/17  Yes Mack Hook, MD  atorvastatin (LIPITOR) 20 MG tablet 1 tab by mouth daily with evening meal 03/11/17  Yes Mack Hook, MD  Blood Glucose Monitoring Suppl (AGAMATRIX PRESTO) w/Device KIT Check sugars twice daily 06/04/16  Yes Mack Hook, MD  ferrous gluconate (FERGON) 225 (27 Fe) MG tablet Take 240 mg by mouth 2 (two) times daily.   Yes [provider]  furosemide (LASIX) 20 MG tablet Take 1 tablet (20 mg total) by mouth daily. Patient taking differently: Take 40 mg by mouth daily.  01/14/17  Yes Mack Hook, MD  glipiZIDE (GLUCOTROL) 5 MG tablet 1 tab by mouth twice daily with meals Patient taking differently: Take 10 mg by mouth daily before breakfast.  03/11/17  Yes Mack Hook, MD  glucose blood (AGAMATRIX PRESTO TEST) test strip Check sugars twice daily 06/04/16  Yes Mack Hook, MD  metoprolol tartrate (LOPRESSOR) 25 MG tablet Take 1 tablet (25 mg total) by mouth 2 (two) times daily. 03/11/17  Yes Mack Hook, MD    Physical Exam: Vitals:   04/02/17 1848 04/02/17 1901 04/02/17 2141  BP: (!) 149/76  (!) 150/73  Pulse: 65  66  Resp: 16  14  Temp: 97.8 F (36.6 C)  TempSrc: Oral    SpO2: 100%  100%  Weight:  65.4 kg (144 lb 1.6 oz)       Constitutional: Moderately built and nourished. Vitals:   04/02/17 1848 04/02/17 1901 04/02/17 2141  BP: (!) 149/76  (!) 150/73  Pulse: 65  66  Resp: 16  14  Temp: 97.8 F (36.6 C)    TempSrc: Oral    SpO2: 100%  100%  Weight:  65.4 kg (144 lb 1.6 oz)    Eyes: Anicteric mild pallor. ENMT: No discharge from the ears eyes nose or mouth. Neck: No mass felt. JVD elevated. Respiratory: No rhonchi or  crepitations. Cardiovascular: S1-S2 heard no murmurs appreciated. Abdomen: Soft nontender bowel sounds present. Musculoskeletal: Bilateral lower extremity edema extending up to the groin. Skin: No rash. Skin appears warm. Neurologic: Alert awake oriented to time place and person. Moves all extremities. Psychiatric: Appears normal. Normal affect.   Labs on Admission: I have personally reviewed following labs and imaging studies  CBC:  Recent Labs Lab 04/02/17 1901  WBC 7.7  HGB 8.6*  HCT 26.7*  MCV 85.3  PLT 921   Basic Metabolic Panel:  Recent Labs Lab 04/01/17 1107 04/02/17 1901  NA 142 136  K 5.5* 5.2*  CL 110* 109  CO2 16* 19*  GLUCOSE 46* 146*  BUN 61* 72*  CREATININE 4.67* 4.88*  CALCIUM 9.0 8.5*   GFR: Estimated Creatinine Clearance: 14.9 mL/min (A) (by C-G formula based on SCr of 4.88 mg/dL (H)). Liver Function Tests:  Recent Labs Lab 04/03/17 0203  AST 20  ALT 30  ALKPHOS 115  BILITOT 0.4  PROT 6.7  ALBUMIN 2.8*   No results for input(s): LIPASE, AMYLASE in the last 168 hours. No results for input(s): AMMONIA in the last 168 hours. Coagulation Profile: No results for input(s): INR, PROTIME in the last 168 hours. Cardiac Enzymes: No results for input(s): CKTOTAL, CKMB, CKMBINDEX, TROPONINI in the last 168 hours. BNP (last 3 results) No results for input(s): PROBNP in the last 8760 hours. HbA1C: No results for input(s): HGBA1C in the last 72 hours. CBG:  Recent Labs Lab 04/02/17 1900  GLUCAP 133*   Lipid Profile: No results for input(s): CHOL, HDL, LDLCALC, TRIG, CHOLHDL, LDLDIRECT in the last 72 hours. Thyroid Function Tests: No results for input(s): TSH, T4TOTAL, FREET4, T3FREE, THYROIDAB in the last 72 hours. Anemia Panel: No results for input(s): VITAMINB12, FOLATE, FERRITIN, TIBC, IRON, RETICCTPCT in the last 72 hours. Urine analysis:    Component Value Date/Time   APPEARANCEUR Clear 04/01/2017 1107   GLUCOSEU Negative  04/01/2017 1107   BILIRUBINUR neg 04/01/2017 1246   BILIRUBINUR Negative 04/01/2017 1107   PROTEINUR +++ 04/01/2017 1246   PROTEINUR 3+ (A) 04/01/2017 1107   UROBILINOGEN 0.2 04/01/2017 1246   NITRITE neg 04/01/2017 1246   NITRITE Negative 04/01/2017 1107   LEUKOCYTESUR Negative 04/01/2017 1246   LEUKOCYTESUR Negative 04/01/2017 1107   Sepsis Labs: '@LABRCNTIP' (procalcitonin:4,lacticidven:4) ) Recent Results (from the past 240 hour(s))  Microscopic Examination     Status: Abnormal   Collection Time: 04/01/17 11:07 AM  Result Value Ref Range Status   WBC, UA 0-5 0 - 5 /hpf Final   RBC, UA 11-30 (A) 0 - 2 /hpf Final   Epithelial Cells (non renal) 0-10 0 - 10 /hpf Final   Casts None seen None seen /lpf Final   Crystals Present (A) N/A Final   Crystal Type Amorphous Sediment N/A Final   Mucus, UA Present Not Estab. Final  Bacteria, UA Few None seen/Few Final     Radiological Exams on Admission: Dg Chest 2 View  Result Date: 04/02/2017 CLINICAL DATA:  Swelling of the feet, legs and pelvic area. EXAM: CHEST  2 VIEW COMPARISON:  None. FINDINGS: Cardiomegaly is noted without overt pulmonary edema. No pneumonic consolidation, effusion or pneumothorax. Streaky atelectasis and/or scarring is noted in the right middle lobe and left lung base. The aorta is not aneurysmal. No acute nor suspicious osseous abnormality. IMPRESSION: Cardiomegaly without acute pulmonary abnormality. Streaky atelectasis and/or scarring is seen in the right middle lobe and left lung base. Electronically Signed   By: Ashley Royalty M.D.   On: 04/02/2017 20:04    EKG: Independently reviewed. Normal sinus rhythm.  Assessment/Plan Principal Problem:   Anasarca Active Problems:   Uncontrolled type 2 diabetes mellitus with complication, without long-term current use of insulin (HCC)   Anemia of chronic kidney failure   Hypertension   Acute CHF (congestive heart failure) ([place])    1. Anasarca/fluid overload - suspect  likely from diabetic nephropathy with nephrotic syndrome. Urinalysis is pending. Check FENa. Patient is placed on IV Lasix and has received one 20 mg IV in the ER and I have placed patient on 60 mg IV every 12. Closely follow intake output and metabolic panel. May need nephrology consult. Will check Dopplers of the lower extremity. 2. Chronic kidney disease stage 4-5 - see #1. UA and FENa are pending. Closely follow intake output. 3. Diabetes mellitus type 2 - will keep patient on sliding scale coverage and hold oral hypoglycemics due to renal failure. 4. Hypertension - on amlodipine and metoprolol. 5. Hyperlipidemia on statins. 6. Normocytic normochromic anemia likely from renal disease. Check anemia panel. 7. History of alcohol abuse has not had any alcohol for last 1 year - follow abdominal sonogram.  HIV and acute hepatitis panel pending.   DVT prophylaxis: Heparin. Code Status: Full code.  Family Communication: Family at the bedside.  Disposition Plan: Home.  Consults called: None.  Admission status: Inpatient.    Rise Patience MD [facility] Pager [phone].  If 7PM-7AM, please contact night-coverage [email or web address] Password [email or web address]  04/03/2017, 3:37 AM

## 2017-04-03 NOTE — ED Notes (Signed)
Patient sitting on the side of the bed eating meal brought in by family member

## 2017-04-03 NOTE — ED Notes (Signed)
Per Dr Raliegh Ip patient can go to a telemetry bed and does not need a step down bed

## 2017-04-03 NOTE — Progress Notes (Signed)
  Echocardiogram 2D Echocardiogram has been performed.  Blake Santana 04/03/2017, 3:04 PM

## 2017-04-03 NOTE — ED Notes (Signed)
Per main lab recollect lavender top for bnp...recollect done and sent.

## 2017-04-03 NOTE — Progress Notes (Signed)
Blake Santana, is a 46 y.o. male, DOB - 1971/05/16, XTK:[id number]  Patient admitted few hours ago for shortness of breath and worsening edema, UA shows more than 300 g of proteinuria close to nephrotic range, he has history of CK D4, remote history of alcohol abuse.  At this time his chemical findings are highly suspicious for developing nephrotic syndrome, UA noted, renal ultrasound ordered along with echocardiogram, nephrology has been consulted. For now continue diuresis, place on salt and fluid restriction and monitor. He also has anemia of chronic disease with stable anemia panel.   Vitals:   04/03/17 0430 04/03/17 0445 04/03/17 0500 04/03/17 0532  BP: 140/75 135/68 136/69 (!) 142/67  Pulse: 68 68 69 79  Resp: 16 16 15 18   Temp:    98.3 F (36.8 C)  TempSrc:    Oral  SpO2: 96% 97% 97% 97%  Weight:            Data Review   Micro Results Recent Results (from the past 240 hour(s))  Microscopic Examination     Status: Abnormal   Collection Time: 04/01/17 11:07 AM  Result Value Ref Range Status   WBC, UA 0-5 0 - 5 /hpf Final   RBC, UA 11-30 (A) 0 - 2 /hpf Final   Epithelial Cells (non renal) 0-10 0 - 10 /hpf Final   Casts None seen None seen /lpf Final   Crystals Present (A) N/A Final   Crystal Type Amorphous Sediment N/A Final   Mucus, UA Present Not Estab. Final   Bacteria, UA Few None seen/Few Final    Radiology Reports Dg Chest 2 View  Result Date: 04/02/2017 CLINICAL DATA:  Swelling of the feet, legs and pelvic area. EXAM: CHEST  2 VIEW COMPARISON:  None. FINDINGS: Cardiomegaly is noted without overt pulmonary edema. No pneumonic consolidation, effusion or pneumothorax. Streaky atelectasis and/or scarring is noted in the right middle lobe and left lung base. The aorta is not aneurysmal. No acute nor suspicious osseous abnormality. IMPRESSION: Cardiomegaly without acute pulmonary  abnormality. Streaky atelectasis and/or scarring is seen in the right middle lobe and left lung base. Electronically Signed   By: Ashley Royalty M.D.   On: 04/02/2017 20:04   US Abdomen Complete  Result Date: 04/03/2017 CLINICAL DATA:  Acute renal failure EXAM: ABDOMEN ULTRASOUND COMPLETE COMPARISON:  None available FINDINGS: Gallbladder: No gallstones or wall thickening visualized. No sonographic Murphy sign noted by sonographer. Common bile duct: Diameter: 2.5 mm Liver: No focal lesion identified. Within normal limits in parenchymal echogenicity. Portal vein is patent on color Doppler imaging with normal direction of blood flow towards the liver. IVC: No abnormality visualized. Pancreas: Proximal pancreas unremarkable. Pancreatic tail obscured by bowel gas Spleen: Size and appearance within normal limits. Right Kidney: Length: 13.3 cm. Echogenicity within normal limits. No mass or hydronephrosis visualized. Left Kidney: Length: 11.6 cm. Echogenicity within normal limits. No mass or hydronephrosis visualized. Abdominal aorta: No aneurysm visualized. Other findings: No ascites or free fluid IMPRESSION: No acute finding by abdominal ultrasound. Electronically Signed   By: Jerilynn Mages.  Shick M.D.   On: 04/03/2017 09:52    CBC  Recent Labs Lab 04/02/17 1901 04/03/17 0341  WBC 7.7 7.3  HGB 8.6* 8.5*  HCT 26.7* 25.8*  PLT 218 211  MCV 85.3 83.8  MCH 27.5 27.6  MCHC 32.2 32.9  RDW 14.2 14.1    Chemistries   Recent Labs Lab 04/01/17 1107 04/02/17 1901 04/03/17 0203 04/03/17 0341  NA 142 136  --  138  K 5.5* 5.2*  --  4.9  CL 110* 109  --  112*  CO2 16* 19*  --  18*  GLUCOSE 46* 146*  --  110*  BUN 61* 72*  --  71*  CREATININE 4.67* 4.88*  --  4.70*  CALCIUM 9.0 8.5*  --  8.6*  AST  --   --  20  --   ALT  --   --  30  --   ALKPHOS  --   --  115  --   BILITOT  --   --  0.4  --     ------------------------------------------------------------------------------------------------------------------ estimated creatinine clearance is 15.5 mL/min (A) (by C-G formula based on SCr of 4.7 mg/dL (H)). ------------------------------------------------------------------------------------------------------------------ No results for input(s): HGBA1C in the last 72 hours. ------------------------------------------------------------------------------------------------------------------ No results for input(s): CHOL, HDL, LDLCALC, TRIG, CHOLHDL, LDLDIRECT in the last 72 hours. ------------------------------------------------------------------------------------------------------------------  Recent Labs  04/03/17 0341  TSH 2.963   ------------------------------------------------------------------------------------------------------------------ No results for input(s): VITAMINB12, FOLATE, FERRITIN, TIBC, IRON, RETICCTPCT in the last 72 hours.  Coagulation profile No results for input(s): INR, PROTIME in the last 168 hours.  No results for input(s): DDIMER in the last 72 hours.  Cardiac Enzymes  Recent Labs Lab 04/03/17 0341  TROPONINI <0.03   ------------------------------------------------------------------------------------------------------------------ Invalid input(s): POCBNP

## 2017-04-03 NOTE — Consult Note (Signed)
Renal Service Consult Note [place] 04/03/2017 Roney Jaffe D Requesting Physician:  Dr. Candiss Norse  Reason for Consult:  Renal failure HPI: The patient is a 46 y.o. year-old with hx of IDDM 15-20 years, HTN, iron def, anemia, and CKD.  Patient came to ED for LE swelling not responding to lasix, also having DOE.  Creat was high 4.5 range and pt was admitted.  UA showed > 300 prot, 6-30 rbc's. Abd US showed normal kidneys, no hydro, normal echo.  Pt denies use of nsaids', but there may have been a language barrier getting that information.    Pt is from [place], lives in [place] w/ his wife and one child.  He has been in [place] about 20 yrs. occ etoh, no tob, no drugs.  He sees an eye doctor for diabetic eye problems, gets injections.    Appetite is good, does have some DOE.  No CP, no voiding issues, no N/V/D.  No fevers or discolored urine.     ROS  denies CP  no joint pain   no HA  no blurry vision  no rash  no diarrhea  no nausea/ vomiting  no dysuria  no difficulty voiding  no change in urine color    Past Medical History  Past Medical History:  Diagnosis Date  . Anemia of chronic kidney failure 03/11/2017  . Chronic kidney disease 03/11/2017  . Diabetes mellitus without complication ([place]) 5732  . Edema 01/14/2017  . Hypertension 03/11/2017  . Iron deficiency anemia 06/18/2016   Past Surgical History History reviewed. No pertinent surgical history. Family History  Family History  Problem Relation Age of Onset  . Diabetes Mother   . Kidney disease Mother        renal failure--diabetes, cause of death  . Hypertension Mother   . Diabetes Father   . Kidney disease Father        Cause of death:  kidney failure form diabetes  . Alcohol abuse Father   . Diabetes Sister    Social History  reports that he has quit smoking. He has never used smokeless tobacco. He reports that he does not drink alcohol or use drugs. Allergies No Known Allergies Home  medications Prior to Admission medications   Medication Sig Start Date End Date Taking? Authorizing Provider  amLODipine (NORVASC) 5 MG tablet Take 1 tablet (5 mg total) by mouth daily. Patient taking differently: Take 10 mg by mouth daily.  03/26/17  Yes Mack Hook, MD  atorvastatin (LIPITOR) 20 MG tablet 1 tab by mouth daily with evening meal 03/11/17  Yes Mack Hook, MD  Blood Glucose Monitoring Suppl (AGAMATRIX PRESTO) w/Device KIT Check sugars twice daily 06/04/16  Yes Mack Hook, MD  ferrous gluconate (FERGON) 225 (27 Fe) MG tablet Take 240 mg by mouth 2 (two) times daily.   Yes [provider]  furosemide (LASIX) 20 MG tablet Take 1 tablet (20 mg total) by mouth daily. Patient taking differently: Take 40 mg by mouth daily.  01/14/17  Yes Mack Hook, MD  glipiZIDE (GLUCOTROL) 5 MG tablet 1 tab by mouth twice daily with meals Patient taking differently: Take 10 mg by mouth daily before breakfast.  03/11/17  Yes Mack Hook, MD  glucose blood (AGAMATRIX PRESTO TEST) test strip Check sugars twice daily 06/04/16  Yes Mack Hook, MD  metoprolol tartrate (LOPRESSOR) 25 MG tablet Take 1 tablet (25 mg total) by mouth 2 (two) times daily. 03/11/17  Yes Mack Hook, MD  Liver Function Tests  Recent Labs Lab 04/03/17 0203  AST 20  ALT 30  ALKPHOS 115  BILITOT 0.4  PROT 6.7  ALBUMIN 2.8*   No results for input(s): LIPASE, AMYLASE in the last 168 hours. CBC  Recent Labs Lab 04/02/17 1901 04/03/17 0341  WBC 7.7 7.3  HGB 8.6* 8.5*  HCT 26.7* 25.8*  MCV 85.3 83.8  PLT 218 809   Basic Metabolic Panel  Recent Labs Lab 04/01/17 1107 04/02/17 1901 04/03/17 0341  NA 142 136 138  K 5.5* 5.2* 4.9  CL 110* 109 112*  CO2 16* 19* 18*  GLUCOSE 46* 146* 110*  BUN 61* 72* 71*  CREATININE 4.67* 4.88* 4.70*  CALCIUM 9.0 8.5* 8.6*  PHOS  --   --  5.0*   Iron/TIBC/Ferritin/ %Sat    Component Value Date/Time   IRON 31 (L)  04/03/2017 1114   IRON 61 01/14/2017 1046   TIBC 312 04/03/2017 1114   TIBC 274 01/14/2017 1046   FERRITIN 50 04/03/2017 1114   IRONPCTSAT 10 (L) 04/03/2017 1114   IRONPCTSAT 22 01/14/2017 1046    Vitals:   04/03/17 0445 04/03/17 0500 04/03/17 0532 04/03/17 1151  BP: 135/68 136/69 (!) 142/67 (!) 148/70  Pulse: 68 69 79 72  Resp: _0 Temp:   98.3 F (36.8 C) 98.3 F (36.8 C)  TempSrc:   Oral Oral  SpO2: 97% 97% 97% 97%  Weight:       Exam Gen alert, no distress, calm No rash, cyanosis or gangrene Sclera anicteric, throat clear  +JVD to angle of jaw, no bruits/ nodes Chest dec'd L base, R clear , no rales or wheezing RRR no MRG Abd soft ntnd no mass or ascites +bs GU normal male MS no joint effusions or deformity Ext 2+ bilat LE pitting edema / no wounds or ulcers Neuro is alert, Ox 3 , nf   Date   Creat  eGFR Nov 2017  1.05 Oct 2016  1.34  63 Jun 2018  2.29  33 Mar 11, 2017  3.42  20 Aug 16   3.35  21 Aug 23   3.74  18 Aug 30   4.67  14 Aug 31   4.88 Sept 1, 2018  4.70  13  [place] > 11.6/ 13.3 cm kidneys, normal echotexture, no hydro UA  > 300 prot, 6- 30 rbc, 0-5 wbc CXR 8/31 > no acute disease   Impression: 1.  Renal failure/ proteinuira/ LE edema - progressive voer the last 8-12 months.  Has proteinuria and likely neph syndrome, and has a history of long-standing DM2.  However rate of creat rise is faster than would be expected w/ his DM alone.  Will plan , in addition to serologies, that pt have a renal biopsy.  Will ask IR to consult.   2  Vol excess - not really diuresing, will ^lasix a bit.  3  IDDM 4  HTN  5  Anemia - Hb 8-9 range    Plan - as above.   Kelly Splinter MD [employer] pager [phone]   04/03/2017, 1:49 PM

## 2017-04-03 NOTE — ED Notes (Signed)
Sent labels to main lab to add on hfp and bnp.

## 2017-04-03 NOTE — ED Notes (Signed)
Patient presents from home stating he has been having mid sternal chest pain and increasing SOB.  Bilateral  lower leg and feet swelling over a course of several days. 3+ pitting edema noted

## 2017-04-04 DIAGNOSIS — I10 Essential (primary) hypertension: Secondary | ICD-10-CM

## 2017-04-04 DIAGNOSIS — E1165 Type 2 diabetes mellitus with hyperglycemia: Secondary | ICD-10-CM

## 2017-04-04 DIAGNOSIS — N289 Disorder of kidney and ureter, unspecified: Secondary | ICD-10-CM

## 2017-04-04 DIAGNOSIS — N189 Chronic kidney disease, unspecified: Secondary | ICD-10-CM

## 2017-04-04 DIAGNOSIS — D631 Anemia in chronic kidney disease: Secondary | ICD-10-CM

## 2017-04-04 DIAGNOSIS — R609 Edema, unspecified: Secondary | ICD-10-CM

## 2017-04-04 DIAGNOSIS — R0609 Other forms of dyspnea: Secondary | ICD-10-CM

## 2017-04-04 DIAGNOSIS — E118 Type 2 diabetes mellitus with unspecified complications: Secondary | ICD-10-CM

## 2017-04-04 DIAGNOSIS — R601 Generalized edema: Secondary | ICD-10-CM

## 2017-04-04 LAB — GLUCOSE, CAPILLARY
GLUCOSE-CAPILLARY: 167 mg/dL — AB (ref 65–99)
GLUCOSE-CAPILLARY: 187 mg/dL — AB (ref 65–99)
Glucose-Capillary: 173 mg/dL — ABNORMAL HIGH (ref 65–99)
Glucose-Capillary: 138 mg/dL — ABNORMAL HIGH (ref 65–99)

## 2017-04-04 LAB — BASIC METABOLIC PANEL
ANION GAP: 7 (ref 5–15)
BUN: 70 mg/dL — AB (ref 6–20)
CALCIUM: 8.6 mg/dL — AB (ref 8.9–10.3)
CO2: 21 mmol/L — AB (ref 22–32)
CREATININE: 4.69 mg/dL — AB (ref 0.61–1.24)
Chloride: 110 mmol/L (ref 101–111)
GFR calc Af Amer: 16 mL/min — ABNORMAL LOW (ref >60)
GFR, EST NON AFRICAN AMERICAN: 13 mL/min — AB (ref >60)
GLUCOSE: 109 mg/dL — AB (ref 65–99)
Potassium: 4.5 mmol/L (ref 3.5–5.1)
Sodium: 138 mmol/L (ref 135–145)

## 2017-04-04 LAB — CBC
HCT: 24.9 % — ABNORMAL LOW (ref 39.0–52.0)
Hemoglobin: 8.2 g/dL — ABNORMAL LOW (ref 13.0–17.0)
MCH: 27.4 pg (ref 26.0–34.0)
MCHC: 32.9 g/dL (ref 30.0–36.0)
MCV: 83.3 fL (ref 78.0–100.0)
Platelets: 218 10*3/uL (ref 150–400)
RBC: 2.99 MIL/uL — ABNORMAL LOW (ref 4.22–5.81)
RDW: 13.9 % (ref 11.5–15.5)
WBC: 6.3 10*3/uL (ref 4.0–10.5)

## 2017-04-04 LAB — PROTEIN / CREATININE RATIO, URINE
Creatinine, Urine: 47.55 mg/dL
PROTEIN CREATININE RATIO: 3.03 mg/mg{creat} — AB (ref 0.00–0.15)
Total Protein, Urine: 144 mg/dL

## 2017-04-04 LAB — C3 COMPLEMENT: C3 COMPLEMENT: 133 mg/dL (ref 82–167)

## 2017-04-04 LAB — C4 COMPLEMENT: COMPLEMENT C4, BODY FLUID: 35 mg/dL (ref 14–44)

## 2017-04-04 NOTE — Progress Notes (Signed)
[facility] Progress Note  Subjective: no c/o, thinks the swelling is going down in his legs  Vitals:   04/03/17 2104 04/04/17 0416 04/04/17 0418 04/04/17 1033  BP: 138/67 133/73  (!) 142/68  Pulse: 70 66  70  Resp: 16 16    Temp: 98.6 F (37 C) 98.3 F (36.8 C)    TempSrc: Oral Oral    SpO2: 97% 96%    Weight:   62 kg (136 lb 11.2 oz)     Inpatient medications: . amLODipine  10 mg Oral Daily  . atorvastatin  20 mg Oral q1800  . ferrous gluconate  324 mg Oral BID  . furosemide  80 mg Intravenous BID  . heparin  5,000 Units Subcutaneous Q8H  . insulin aspart  0-9 Units Subcutaneous TID WC  . metoprolol tartrate  25 mg Oral BID    acetaminophen **OR** acetaminophen, ondansetron **OR** ondansetron (ZOFRAN) IV  Exam: Gen alert, no distress, calm No rash, cyanosis or gangrene Sclera anicteric, throat clear  +JVD to angle of jaw, no bruits/ nodes Chest dec'd L base, R clear , no rales or wheezing RRR no MRG Abd soft ntnd no mass or ascites +bs GU normal male MS no joint effusions or deformity Ext 2+ bilat LE pitting edema / no wounds or ulcers Neuro is alert, Ox 3 , nf   Date                            Creat               eGFR Nov 2017                     1.05 Oct 2016                     1.34                 63 Jun 2018                     2.29                 33 Mar 11, 2017                 3.42                 20 Aug 16                         3.35                 21 Aug 23                         3.74                 18 Aug 30                         4.67                 14 Aug 31                         4.88 Sept 1, 2018                4.[place]  13  [place] > 11.6/ 13.3 cm kidneys, normal echotexture, no hydro Alb 2.8 UA  > 300 prot, 6- 30 rbc, 0-5 wbc CXR 8/31 > no acute disease   Impression: 1.  Renal failure/ proteinuira/ LE edema - progressive the last 8-12 months.  Has sig proteinuria and has a history of long-standing DM2.   However rate of creat rise is faster than would be expected w/ DM alone.  Serologies pending.  Needs renal biopsy, have consulted IR.  Have discussed renal condition and need for renal biopsy with patient through an interpreter and pt is OK to proceed.    2 Vol excess - down 3kg with IV lasix, and edema improving   3  IDDM 4  HTN  5  Anemia - Hb 8-9 range   Plan - as above   Kelly Splinter MD [facility] pager [phone]   04/04/2017, 10:38 AM    Recent Labs Lab 04/02/17 1901 04/03/17 0341 04/04/17 0556  NA 136 138 138  K 5.2* 4.9 4.5  CL 109 112* 110  CO2 19* 18* 21*  GLUCOSE 146* 110* 109*  BUN 72* 71* 70*  CREATININE 4.88* 4.70* 4.69*  CALCIUM 8.5* 8.6* 8.6*  PHOS  --  5.0*  --     Recent Labs Lab 04/03/17 0203  AST 20  ALT 30  ALKPHOS 115  BILITOT 0.4  PROT 6.7  ALBUMIN 2.8*    Recent Labs Lab 04/02/17 1901 04/03/17 0341 04/04/17 0556  WBC 7.7 7.3 6.3  HGB 8.6* 8.5* 8.2*  HCT 26.7* 25.8* 24.9*  MCV 85.3 83.8 83.3  PLT 218 211 218   Iron/TIBC/Ferritin/ %Sat    Component Value Date/Time   IRON 31 (L) 04/03/2017 1114   IRON 61 01/14/2017 1046   TIBC 312 04/03/2017 1114   TIBC 274 01/14/2017 1046   FERRITIN 50 04/03/2017 1114   IRONPCTSAT 10 (L) 04/03/2017 1114   IRONPCTSAT 22 01/14/2017 1046

## 2017-04-04 NOTE — Progress Notes (Signed)
PROGRESS NOTE  Blake Santana  GEX:[id number] DOB: 1971-02-10 DOA: 04/03/2017 PCP: Mack Hook, MD   Brief Narrative: Blake Santana is a 46 y.o. male with a history of stage IV CKD, HTN, and IDDM who presented 9/1 with progressive dyspnea and lower extremity edema found to have anasarca and nephrotic range proteinuria. IV lasix was started with poor urine output. Nephrology consulted, augmented diuresis with improvement in edema and urine output. Serologies were sent and renal biopsy is planned.   Assessment & Plan: Principal Problem:   Anasarca Active Problems:   Uncontrolled type 2 diabetes mellitus with complication, without long-term current use of insulin (HCC)   Anemia of chronic kidney failure   Hypertension   Acute CHF (congestive heart failure) (HCC)  Nephrotic syndrome and stage IV CKD: Nephrology believes rate of creatinine rise greater than typical for diabetic nephropathy alone. U/S showed 11.6 / 13.3cm normal echotexture, no hydronephrosis.  - Appreciate nephrology assistance, diuresing.  - Serologies pending - Renal biopsy per IR  Anemia of chronic disease and iron deficiency anemia: Stable with hgb 8-9.  - Monitor hgb intermittently - Continue home iron  Acute HFpEF: Echo 9/1 with EF 50-55%, G2DD, normal wall motion.  - Lasix 80mg  IV BID as above - Daily weights, strict I/O - Tx HTN  HTN: Chronic, stable - Continue metoprolol 25mg  BID, norvasc 10mg   T2DM: Prolonged (15-20 yrs), HbA1c 8% in June 2018.  - Sensitive SSI - Continue statin - Holding home glipizide  DVT prophylaxis: Heparin Code Status: Full Family Communication: None Disposition Plan: Home pending further workup and diuresis.   Consultants:   Nephrology  Procedures:   Echocardiogram 04/03/2017: Study Conclusions - Left ventricle: The cavity size was normal. Wall thickness was   normal. Systolic function was normal. The estimated ejection   fraction was in the range of  50% to 55%. Wall motion was normal;   there were no regional wall motion abnormalities. Features are   consistent with a pseudonormal left ventricular filling pattern,   with concomitant abnormal relaxation and increased filling   pressure (grade 2 diastolic dysfunction). Doppler parameters are   consistent with high ventricular filling pressure. - Mitral valve: There was mild regurgitation. - Left atrium: The atrium was moderately dilated. - Pulmonary arteries: Systolic pressure was mildly increased. PA   peak pressure: 35 mm Hg (S). - Pericardium, extracardiac: A small pericardial effusion was   identified.  Impressions: - Normal LV systolic function; moderate diastolic dysfunction;   elevated LV filling pressure; mild MR; moderate LAE; mild TR with   mildly elevated pulmonary pressure; small pericardial effusion.  Antimicrobials:  None   Subjective: Feels better breathing, less swelling. Still much more than baseline. Dyspneic on exertion. No chest pain.   Objective: Vitals:   04/04/17 0416 04/04/17 0418 04/04/17 1033 04/04/17 1454  BP: 133/73  (!) 142/68 126/67  Pulse: 66  70 65  Resp: 16   17  Temp: 98.3 F (36.8 C)   98.2 F (36.8 C)  TempSrc: Oral   Oral  SpO2: 96%   97%  Weight:  62 kg (136 lb 11.2 oz)      Intake/Output Summary (Last 24 hours) at 04/04/17 1629 Last data filed at 04/04/17 0939  Gross per 24 hour  Intake              480 ml  Output                0 ml  Net  480 ml   Filed Weights   04/02/17 1901 04/04/17 0418  Weight: 65.4 kg (144 lb 1.6 oz) 62 kg (136 lb 11.2 oz)    Gen: 46 y.o. male in no distress Pulm: Non-labored breathing room air. Clear to auscultation bilaterally.  CV: Regular rate and rhythm. No murmur, rub, or gallop. No JVD, 2+ pitting LE edema to hips. GI: Abdomen soft, non-tender, non-distended, with normoactive bowel sounds. No organomegaly or masses felt. Ext: Warm, no deformities Skin: No rashes, lesions no  ulcers Neuro: Alert and oriented. No focal neurological deficits. Psych: Judgement and insight appear normal. Mood & affect appropriate.   Data Reviewed: I have personally reviewed following labs and imaging studies  CBC:  Recent Labs Lab 04/02/17 1901 04/03/17 0341 04/04/17 0556  WBC 7.7 7.3 6.3  HGB 8.6* 8.5* 8.2*  HCT 26.7* 25.8* 24.9*  MCV 85.3 83.8 83.3  PLT 218 211 097   Basic Metabolic Panel:  Recent Labs Lab 04/01/17 1107 04/02/17 1901 04/03/17 0341 04/04/17 0556  NA 142 136 138 138  K 5.5* 5.2* 4.9 4.5  CL 110* 109 112* 110  CO2 16* 19* 18* 21*  GLUCOSE 46* 146* 110* 109*  BUN 61* 72* 71* 70*  CREATININE 4.67* 4.88* 4.70* 4.69*  CALCIUM 9.0 8.5* 8.6* 8.6*  PHOS  --   --  5.0*  --    GFR: Estimated Creatinine Clearance: 15.5 mL/min (A) (by C-G formula based on SCr of 4.69 mg/dL (H)). Liver Function Tests:  Recent Labs Lab 04/03/17 0203  AST 20  ALT 30  ALKPHOS 115  BILITOT 0.4  PROT 6.7  ALBUMIN 2.8*   No results for input(s): LIPASE, AMYLASE in the last 168 hours. No results for input(s): AMMONIA in the last 168 hours. Coagulation Profile: No results for input(s): INR, PROTIME in the last 168 hours. Cardiac Enzymes:  Recent Labs Lab 04/03/17 0341 04/03/17 1114 04/03/17 1533  TROPONINI <0.03 <0.03 <0.03   BNP (last 3 results) No results for input(s): PROBNP in the last 8760 hours. HbA1C: No results for input(s): HGBA1C in the last 72 hours. CBG:  Recent Labs Lab 04/03/17 1227 04/03/17 1705 04/03/17 2107 04/04/17 0929 04/04/17 1232  GLUCAP 92 178* 143* 173* 167*   Lipid Profile: No results for input(s): CHOL, HDL, LDLCALC, TRIG, CHOLHDL, LDLDIRECT in the last 72 hours. Thyroid Function Tests:  Recent Labs  04/03/17 0341  TSH 2.963   Anemia Panel:  Recent Labs  04/03/17 1114  VITAMINB12 6,985*  FOLATE 15.3  FERRITIN 50  TIBC 312  IRON 31*  RETICCTPCT 0.7   Urine analysis:    Component Value Date/Time    COLORURINE YELLOW 04/03/2017 0358   APPEARANCEUR HAZY (A) 04/03/2017 0358   APPEARANCEUR Clear 04/01/2017 1107   LABSPEC 1.011 04/03/2017 0358   PHURINE 5.0 04/03/2017 0358   GLUCOSEU 50 (A) 04/03/2017 0358   HGBUR SMALL (A) 04/03/2017 0358   BILIRUBINUR NEGATIVE 04/03/2017 0358   BILIRUBINUR neg 04/01/2017 1246   BILIRUBINUR Negative 04/01/2017 1107   KETONESUR NEGATIVE 04/03/2017 0358   PROTEINUR >=300 (A) 04/03/2017 0358   UROBILINOGEN 0.2 04/01/2017 1246   NITRITE NEGATIVE 04/03/2017 0358   LEUKOCYTESUR NEGATIVE 04/03/2017 0358   LEUKOCYTESUR Negative 04/01/2017 1107   Recent Results (from the past 240 hour(s))  Microscopic Examination     Status: Abnormal   Collection Time: 04/01/17 11:07 AM  Result Value Ref Range Status   WBC, UA 0-5 0 - 5 /hpf Final   RBC, UA 11-30 (A) 0 -  2 /hpf Final   Epithelial Cells (non renal) 0-10 0 - 10 /hpf Final   Casts None seen None seen /lpf Final   Crystals Present (A) N/A Final   Crystal Type Amorphous Sediment N/A Final   Mucus, UA Present Not Estab. Final   Bacteria, UA Few None seen/Few Final      Radiology Studies: Dg Chest 2 View  Result Date: 04/02/2017 CLINICAL DATA:  Swelling of the feet, legs and pelvic area. EXAM: CHEST  2 VIEW COMPARISON:  None. FINDINGS: Cardiomegaly is noted without overt pulmonary edema. No pneumonic consolidation, effusion or pneumothorax. Streaky atelectasis and/or scarring is noted in the right middle lobe and left lung base. The aorta is not aneurysmal. No acute nor suspicious osseous abnormality. IMPRESSION: Cardiomegaly without acute pulmonary abnormality. Streaky atelectasis and/or scarring is seen in the right middle lobe and left lung base. Electronically Signed   By: Ashley Royalty M.D.   On: 04/02/2017 20:04   US Abdomen Complete  Result Date: 04/03/2017 CLINICAL DATA:  Acute renal failure EXAM: ABDOMEN ULTRASOUND COMPLETE COMPARISON:  None available FINDINGS: Gallbladder: No gallstones or wall  thickening visualized. No sonographic Murphy sign noted by sonographer. Common bile duct: Diameter: 2.5 mm Liver: No focal lesion identified. Within normal limits in parenchymal echogenicity. Portal vein is patent on color Doppler imaging with normal direction of blood flow towards the liver. IVC: No abnormality visualized. Pancreas: Proximal pancreas unremarkable. Pancreatic tail obscured by bowel gas Spleen: Size and appearance within normal limits. Right Kidney: Length: 13.3 cm. Echogenicity within normal limits. No mass or hydronephrosis visualized. Left Kidney: Length: 11.6 cm. Echogenicity within normal limits. No mass or hydronephrosis visualized. Abdominal aorta: No aneurysm visualized. Other findings: No ascites or free fluid IMPRESSION: No acute finding by abdominal ultrasound. Electronically Signed   By: Jerilynn Mages.  Shick M.D.   On: 04/03/2017 09:52    Scheduled Meds: . amLODipine  10 mg Oral Daily  . atorvastatin  20 mg Oral q1800  . ferrous gluconate  324 mg Oral BID  . furosemide  80 mg Intravenous BID  . heparin  5,000 Units Subcutaneous Q8H  . insulin aspart  0-9 Units Subcutaneous TID WC  . metoprolol tartrate  25 mg Oral BID   Continuous Infusions:   LOS: 1 day   Time spent: 25 minutes.  Vance Gather, MD [facility] Pager [phone]  If 7PM-7AM, please contact night-coverage [email or web address] Password [facility] 04/04/2017, 4:29 PM

## 2017-04-05 DIAGNOSIS — I509 Heart failure, unspecified: Secondary | ICD-10-CM

## 2017-04-05 LAB — HEPATITIS PANEL, ACUTE
HCV Ab: <0.1 s/co ratio (ref 0.0–0.9)
HEP B S AG: NEGATIVE
Hep A IgM: NEGATIVE
Hep B C IgM: NEGATIVE

## 2017-04-05 LAB — GLUCOSE, CAPILLARY
GLUCOSE-CAPILLARY: 231 mg/dL — AB (ref 65–99)
GLUCOSE-CAPILLARY: 220 mg/dL — AB (ref 65–99)
Glucose-Capillary: 182 mg/dL — ABNORMAL HIGH (ref 65–99)

## 2017-04-05 MED ORDER — INSULIN ASPART 100 UNIT/ML ~~LOC~~ SOLN
0.0000 [IU] | Freq: Three times a day (TID) | SUBCUTANEOUS | Status: DC
  Administered 2017-04-05: 5 [IU] via SUBCUTANEOUS
  Administered 2017-04-06: 2 [IU] via SUBCUTANEOUS
  Administered 2017-04-06: 5 [IU] via SUBCUTANEOUS
  Administered 2017-04-06 – 2017-04-07 (×3): 2 [IU] via SUBCUTANEOUS
  Administered 2017-04-07: 3 [IU] via SUBCUTANEOUS
  Administered 2017-04-08: 5 [IU] via SUBCUTANEOUS

## 2017-04-05 MED ORDER — FUROSEMIDE 80 MG PO TABS
80.0000 mg | ORAL_TABLET | Freq: Two times a day (BID) | ORAL | Status: DC
  Administered 2017-04-05 – 2017-04-06 (×3): 80 mg via ORAL
  Filled 2017-04-05 (×3): qty 1

## 2017-04-05 MED ORDER — INSULIN ASPART 100 UNIT/ML ~~LOC~~ SOLN
0.0000 [IU] | Freq: Every day | SUBCUTANEOUS | Status: DC
  Administered 2017-04-07: 2 [IU] via SUBCUTANEOUS

## 2017-04-05 NOTE — Progress Notes (Signed)
PROGRESS NOTE  Blake Santana  DGU:[id number] DOB: [birth year] DOA: 04/03/2017 PCP: Mack Hook, MD   Brief Narrative: Blake Santana is a 46 y.o. male with a history of stage IV CKD, HTN, and IDDM who presented 9/1 with progressive dyspnea and lower extremity edema found to have anasarca and nephrotic range proteinuria. IV lasix was started with poor urine output. Nephrology consulted, augmented diuresis with improvement in edema and urine output. Serologies were sent and renal biopsy is planned.   Assessment & Plan: Principal Problem:   Anasarca Active Problems:   Uncontrolled type 2 diabetes mellitus with complication, without long-term current use of insulin (HCC)   Anemia of chronic kidney failure   Hypertension   Acute CHF (congestive heart failure) (HCC)  Nephrotic syndrome and stage IV CKD: Nephrology believes rate of creatinine rise greater than typical for diabetic nephropathy alone. U/S showed 11.6 / 13.3cm normal echotexture, no hydronephrosis.  - Appreciate nephrology assistance, diuresing.  - Serologies: Complement (C4 and C3) normal. ANA, IFA, ANCA Ab's, SPEP pending - Renal biopsy per IR 9/4  Anemia of chronic disease and iron deficiency anemia: Stable with hgb 8-9.  - Monitor hgb intermittently - Continue home iron  Acute HFpEF: Echo 9/1 with EF 50-55%, G2DD, normal wall motion.  - Lasix 80mg  IV BID as above - Daily weights, strict I/O - Tx HTN  HTN: Chronic, stable - Continue metoprolol 25mg  BID, norvasc 10mg   T2DM: Prolonged (15-20 yrs), HbA1c 8% in June 2018.  - Increase to Mod SSI, add HS scale - Continue statin - Holding home glipizide  DVT prophylaxis: Subcutaneous heparin Code Status: Full Family Communication: None Disposition Plan: Home pending further workup and diuresis.   Consultants:   Nephrology  Procedures:   Echocardiogram 04/03/2017: Study Conclusions - Left ventricle: The cavity size was normal. Wall thickness was  normal. Systolic function was normal. The estimated ejection   fraction was in the range of 50% to 55%. Wall motion was normal;   there were no regional wall motion abnormalities. Features are   consistent with a pseudonormal left ventricular filling pattern,   with concomitant abnormal relaxation and increased filling   pressure (grade 2 diastolic dysfunction). Doppler parameters are   consistent with high ventricular filling pressure. - Mitral valve: There was mild regurgitation. - Left atrium: The atrium was moderately dilated. - Pulmonary arteries: Systolic pressure was mildly increased. PA   peak pressure: 35 mm Hg (S). - Pericardium, extracardiac: A small pericardial effusion was   identified.  Impressions: - Normal LV systolic function; moderate diastolic dysfunction;   elevated LV filling pressure; mild MR; moderate LAE; mild TR with   mildly elevated pulmonary pressure; small pericardial effusion.  Antimicrobials:  None   Subjective: Blake Santana provided in-person Spanish interpretation for this encounter. Swelling continues to improve, urination is stable. Moving bowels, eating. Dyspnea improved.  Objective: BP 138/70 (BP Location: Left Arm)   Pulse 69   Temp 98.1 F (36.7 C) (Oral)   Resp 18   Wt 62.7 kg (138 lb 3.2 oz)   SpO2 100%   BMI 24.48 kg/m   Gen: 46 y.o. male in no distress Pulm: Non-labored breathing room air. Clear to auscultation bilaterally.  CV: Regular rate and rhythm. No murmur, rub, or gallop. No JVD, 1+ pitting LE edema to mid-thighs GI: Abdomen soft, non-tender, non-distended, with normoactive bowel sounds. No organomegaly or masses felt. Ext: Warm, no deformities Skin: No rashes, lesions no ulcers Neuro: Alert and oriented. No focal neurological  deficits. Psych: Judgement and insight appear normal. Mood & affect appropriate.   Data Reviewed: I have personally reviewed following labs and imaging studies  CBC:  Recent Labs Lab  04/02/17 1901 04/03/17 0341 04/04/17 0556  WBC 7.7 7.3 6.3  HGB 8.6* 8.5* 8.2*  HCT 26.7* 25.8* 24.9*  MCV 85.3 83.8 83.3  PLT 218 211 096   Basic Metabolic Panel:  Recent Labs Lab 04/01/17 1107 04/02/17 1901 04/03/17 0341 04/04/17 0556  NA 142 136 138 138  K 5.5* 5.2* 4.9 4.5  CL 110* 109 112* 110  CO2 16* 19* 18* 21*  GLUCOSE 46* 146* 110* 109*  BUN 61* 72* 71* 70*  CREATININE 4.67* 4.88* 4.70* 4.69*  CALCIUM 9.0 8.5* 8.6* 8.6*  PHOS  --   --  5.0*  --    GFR: Estimated Creatinine Clearance: 15.5 mL/min (A) (by C-G formula based on SCr of 4.69 mg/dL (H)). Liver Function Tests:  Recent Labs Lab 04/03/17 0203  AST 20  ALT 30  ALKPHOS 115  BILITOT 0.4  PROT 6.7  ALBUMIN 2.8*   Cardiac Enzymes:  Recent Labs Lab 04/03/17 0341 04/03/17 1114 04/03/17 1533  TROPONINI <0.03 <0.03 <0.03   CBG:  Recent Labs Lab 04/04/17 0929 04/04/17 1232 04/04/17 1730 04/04/17 2027 04/05/17 1157  GLUCAP 173* 167* 138* 187* 231*   Thyroid Function Tests:  Recent Labs  04/03/17 0341  TSH 2.963   Anemia Panel:  Recent Labs  04/03/17 1114  VITAMINB12 6,985*  FOLATE 15.3  FERRITIN 50  TIBC 312  IRON 31*  RETICCTPCT 0.7   Urine analysis:    Component Value Date/Time   COLORURINE YELLOW 04/03/2017 0358   APPEARANCEUR HAZY (A) 04/03/2017 0358   APPEARANCEUR Clear 04/01/2017 1107   LABSPEC 1.011 04/03/2017 0358   PHURINE 5.0 04/03/2017 0358   GLUCOSEU 50 (A) 04/03/2017 0358   HGBUR SMALL (A) 04/03/2017 0358   BILIRUBINUR NEGATIVE 04/03/2017 0358   BILIRUBINUR neg 04/01/2017 1246   BILIRUBINUR Negative 04/01/2017 1107   KETONESUR NEGATIVE 04/03/2017 0358   PROTEINUR >=300 (A) 04/03/2017 0358   UROBILINOGEN 0.2 04/01/2017 1246   NITRITE NEGATIVE 04/03/2017 0358   LEUKOCYTESUR NEGATIVE 04/03/2017 0358   LEUKOCYTESUR Negative 04/01/2017 1107   Recent Results (from the past 240 hour(s))  Microscopic Examination     Status: Abnormal   Collection Time:  04/01/17 11:07 AM  Result Value Ref Range Status   WBC, UA 0-5 0 - 5 /hpf Final   RBC, UA 11-30 (A) 0 - 2 /hpf Final   Epithelial Cells (non renal) 0-10 0 - 10 /hpf Final   Casts None seen None seen /lpf Final   Crystals Present (A) N/A Final   Crystal Type Amorphous Sediment N/A Final   Mucus, UA Present Not Estab. Final   Bacteria, UA Few None seen/Few Final     Scheduled Meds: . amLODipine  10 mg Oral Daily  . atorvastatin  20 mg Oral q1800  . ferrous gluconate  324 mg Oral BID  . furosemide  80 mg Intravenous BID  . heparin  5,000 Units Subcutaneous Q8H  . insulin aspart  0-9 Units Subcutaneous TID WC  . metoprolol tartrate  25 mg Oral BID     LOS: 2 days   Time spent: 25 minutes.  Vance Gather, MD [facility] Pager [phone]  If 7PM-7AM, please contact night-coverage [email or web address] Password [email or web address] 04/05/2017, 2:31 PM

## 2017-04-05 NOTE — Progress Notes (Signed)
Impression: 1. Renal failure/ proteinuria/ LE edema - progressive the last 8-12 months. For renal biopsy by IR 2 Vol excess - down, change to PO Lasix 3 IDDM 4 HTN  5 Anemia - Hb 8-9 range  Subjective: Interval History: Diuresing, await bx  Objective: Vital signs in last 24 hours: Temp:  [98.1 F (36.7 C)-99.1 F (37.3 C)] 98.8 F (37.1 C) (09/03 1408) Pulse Rate:  [67-70] 67 (09/03 1408) Resp:  [16-18] 16 (09/03 1408) BP: (131-138)/(59-70) 137/64 (09/03 1408) SpO2:  [96 %-100 %] 98 % (09/03 1408) Weight:  [62.7 kg (138 lb 3.2 oz)] 62.7 kg (138 lb 3.2 oz) (09/03 0500) Weight change: 0.68 kg (1 lb 8 oz)  Intake/Output from previous day: 09/02 0701 - 09/03 0700 In: 480 [P.O.:480] Out: -  Intake/Output this shift: Total I/O In: 240 [P.O.:240] Out: -   General appearance: alert and cooperative Chest wall: no tenderness, no prelumbar edema Extremities: edema tr to 1+  Lab Results:  Recent Labs  04/03/17 0341 04/04/17 0556  WBC 7.3 6.3  HGB 8.5* 8.2*  HCT 25.8* 24.9*  PLT 211 218   BMET:  Recent Labs  04/03/17 0341 04/04/17 0556  NA 138 138  K 4.9 4.5  CL 112* 110  CO2 18* 21*  GLUCOSE 110* 109*  BUN 71* 70*  CREATININE 4.70* 4.69*  CALCIUM 8.6* 8.6*   No results for input(s): PTH in the last 72 hours. Iron Studies:  Recent Labs  04/03/17 1114  IRON 31*  TIBC 312  FERRITIN 50   Studies/Results: No results found.  Scheduled: . amLODipine  10 mg Oral Daily  . atorvastatin  20 mg Oral q1800  . ferrous gluconate  324 mg Oral BID  . furosemide  80 mg Oral BID  . heparin  5,000 Units Subcutaneous Q8H  . insulin aspart  0-15 Units Subcutaneous TID WC  . insulin aspart  0-5 Units Subcutaneous QHS  . metoprolol tartrate  25 mg Oral BID     LOS: 2 days   Azarel Banner C 04/05/2017,5:03 PM

## 2017-04-05 NOTE — Progress Notes (Signed)
Interpreter Graciela Namihira for patient °

## 2017-04-06 LAB — PROTEIN ELECTROPHORESIS, SERUM
A/G Ratio: 0.8 (ref 0.7–1.7)
ALPHA-1-GLOBULIN: 0.3 g/dL (ref 0.0–0.4)
ALPHA-2-GLOBULIN: 0.8 g/dL (ref 0.4–1.0)
Albumin ELP: 2.9 g/dL (ref 2.9–4.4)
BETA GLOBULIN: 1.2 g/dL (ref 0.7–1.3)
Gamma Globulin: 1.2 g/dL (ref 0.4–1.8)
Globulin, Total: 3.6 g/dL (ref 2.2–3.9)
TOTAL PROTEIN ELP: 6.5 g/dL (ref 6.0–8.5)

## 2017-04-06 LAB — MPO/PR-3 (ANCA) ANTIBODIES
ANCA Proteinase 3: <3.5 U/mL (ref 0.0–3.5)
Myeloperoxidase Abs: <9 U/mL (ref 0.0–9.0)

## 2017-04-06 LAB — GLUCOSE, CAPILLARY
GLUCOSE-CAPILLARY: 100 mg/dL — AB (ref 65–99)
GLUCOSE-CAPILLARY: 113 mg/dL — AB (ref 65–99)
GLUCOSE-CAPILLARY: 138 mg/dL — AB (ref 65–99)
Glucose-Capillary: 243 mg/dL — ABNORMAL HIGH (ref 65–99)
Glucose-Capillary: 148 mg/dL — ABNORMAL HIGH (ref 65–99)
Glucose-Capillary: 165 mg/dL — ABNORMAL HIGH (ref 65–99)

## 2017-04-06 LAB — KAPPA/LAMBDA LIGHT CHAINS
KAPPA FREE LGHT CHN: 110.5 mg/L — AB (ref 3.3–19.4)
Kappa, lambda light chain ratio: 1.01 (ref 0.26–1.65)
Lambda free light chains: 109 mg/L — ABNORMAL HIGH (ref 5.7–26.3)

## 2017-04-06 LAB — BASIC METABOLIC PANEL
Anion gap: 7 (ref 5–15)
BUN: 65 mg/dL — AB (ref 6–20)
CALCIUM: 8.4 mg/dL — AB (ref 8.9–10.3)
CHLORIDE: 107 mmol/L (ref 101–111)
CO2: 24 mmol/L (ref 22–32)
CREATININE: 4.77 mg/dL — AB (ref 0.61–1.24)
GFR calc non Af Amer: 13 mL/min — ABNORMAL LOW (ref >60)
GFR, EST AFRICAN AMERICAN: 15 mL/min — AB (ref >60)
Glucose, Bld: 156 mg/dL — ABNORMAL HIGH (ref 65–99)
Potassium: 4.1 mmol/L (ref 3.5–5.1)
SODIUM: 138 mmol/L (ref 135–145)

## 2017-04-06 LAB — ANTINUCLEAR ANTIBODIES, IFA: ANTINUCLEAR ANTIBODIES, IFA: NEGATIVE

## 2017-04-06 MED ORDER — FUROSEMIDE 40 MG PO TABS
40.0000 mg | ORAL_TABLET | Freq: Two times a day (BID) | ORAL | Status: DC
  Administered 2017-04-07 – 2017-04-08 (×3): 40 mg via ORAL
  Filled 2017-04-06 (×3): qty 1

## 2017-04-06 MED ORDER — HEPARIN SODIUM (PORCINE) 5000 UNIT/ML IJ SOLN
5000.0000 [IU] | Freq: Three times a day (TID) | INTRAMUSCULAR | Status: DC
  Administered 2017-04-08: 5000 [IU] via SUBCUTANEOUS
  Filled 2017-04-06: qty 1

## 2017-04-06 NOTE — Progress Notes (Signed)
PROGRESS NOTE  Blake Santana  JAS:[id number] DOB: 1971/05/11 DOA: 04/03/2017 PCP: Mack Hook, MD   Brief Narrative: Blake Santana is a 46 y.o. male with a history of stage IV CKD, HTN, and IDDM who presented 9/1 with progressive dyspnea and lower extremity edema found to have anasarca and nephrotic range proteinuria. IV lasix was started with poor urine output. Nephrology consulted, augmented diuresis with improvement in edema and urine output. Serologies were sent and renal biopsy is planned 9/5. Diuresis was converted back to oral lasix 9/4.   Assessment & Plan: Principal Problem:   Anasarca Active Problems:   Uncontrolled type 2 diabetes mellitus with complication, without long-term current use of insulin (HCC)   Anemia of chronic kidney failure   Hypertension   Acute CHF (congestive heart failure) (HCC)  Nephrotic syndrome and stage IV CKD: Nephrology believes rate of creatinine rise greater than typical for diabetic nephropathy alone. U/S showed 11.6 / 13.3cm normal echotexture, no hydronephrosis.  - Appreciate nephrology assistance, diuresing, switched to po lasix.  - Serologies: Complement (C4 and C3) normal.  - ANA, IFA, ANCA Ab's, SPEP pending - Renal biopsy per IR 9/5  Anemia of chronic disease and iron deficiency anemia: Stable with hgb 8-9.  - Monitor hgb intermittently - Continue home iron  Acute HFpEF: Echo 9/1 with EF 50-55%, G2DD, normal wall motion.  - Lasix 80mg  PO BID.  - Daily weights, strict I/O - Tx HTN  HTN: Chronic, stable - Continue metoprolol 25mg  BID, norvasc 10mg   T2DM: Prolonged (15-20 yrs), HbA1c 8% in June 2018.  - Increase to Mod SSI, added HS scale - Continue statin - Holding home glipizide  DVT prophylaxis: Subcutaneous heparin Code Status: Full Family Communication: Cousins at bedside Disposition Plan: Home pending further workup and diuresis. Possibly 9/5.  Consultants:   Nephrology  Procedures:   Echocardiogram  04/03/2017: Study Conclusions - Left ventricle: The cavity size was normal. Wall thickness was   normal. Systolic function was normal. The estimated ejection   fraction was in the range of 50% to 55%. Wall motion was normal;   there were no regional wall motion abnormalities. Features are   consistent with a pseudonormal left ventricular filling pattern,   with concomitant abnormal relaxation and increased filling   pressure (grade 2 diastolic dysfunction). Doppler parameters are   consistent with high ventricular filling pressure. - Mitral valve: There was mild regurgitation. - Left atrium: The atrium was moderately dilated. - Pulmonary arteries: Systolic pressure was mildly increased. PA   peak pressure: 35 mm Hg (S). - Pericardium, extracardiac: A small pericardial effusion was   identified.  Impressions: - Normal LV systolic function; moderate diastolic dysfunction;   elevated LV filling pressure; mild MR; moderate LAE; mild TR with   mildly elevated pulmonary pressure; small pericardial effusion.  Antimicrobials:  None   Subjective: Blake Santana provided in-person Spanish interpretation for this encounter today. He understands and is able to describe risks of the procedure after consulting with IR. Plans to proceed. Swelling is better. No further dyspnea.   Objective: BP (!) 147/71 (BP Location: Right Arm)   Pulse 72   Temp 98.5 F (36.9 C) (Oral)   Resp 18   Wt 62.1 kg (137 lb)   SpO2 97%   BMI 24.27 kg/m   Gen: 46 y.o. male in no distress Pulm: Non-labored breathing room air. Clear to auscultation bilaterally.  CV: Regular rate and rhythm. No murmur, rub, or gallop. No JVD, trace dependent pitting LE edema  to lower legs (greatly improved) GI: Abdomen soft, non-tender, non-distended, with normoactive bowel sounds. No organomegaly or masses felt. Ext: Warm, no deformities Skin: No rashes, lesions no ulcers Neuro: Alert and oriented. No focal neurological  deficits. Psych: Judgement and insight appear normal. Mood & affect appropriate.   Data Reviewed: I have personally reviewed following labs and imaging studies  CBC:  Recent Labs Lab 04/02/17 1901 04/03/17 0341 04/04/17 0556  WBC 7.7 7.3 6.3  HGB 8.6* 8.5* 8.2*  HCT 26.7* 25.8* 24.9*  MCV 85.3 83.8 83.3  PLT 218 211 161   Basic Metabolic Panel:  Recent Labs Lab 04/01/17 1107 04/02/17 1901 04/03/17 0341 04/04/17 0556 04/06/17 0541  NA 142 136 138 138 138  K 5.5* 5.2* 4.9 4.5 4.1  CL 110* 109 112* 110 107  CO2 16* 19* 18* 21* 24  GLUCOSE 46* 146* 110* 109* 156*  BUN 61* 72* 71* 70* 65*  CREATININE 4.67* 4.88* 4.70* 4.69* 4.77*  CALCIUM 9.0 8.5* 8.6* 8.6* 8.4*  PHOS  --   --  5.0*  --   --    GFR: Estimated Creatinine Clearance: 15.2 mL/min (A) (by C-G formula based on SCr of 4.77 mg/dL (H)). Liver Function Tests:  Recent Labs Lab 04/03/17 0203  AST 20  ALT 30  ALKPHOS 115  BILITOT 0.4  PROT 6.7  ALBUMIN 2.8*   Cardiac Enzymes:  Recent Labs Lab 04/03/17 0341 04/03/17 1114 04/03/17 1533  TROPONINI <0.03 <0.03 <0.03   CBG:  Recent Labs Lab 04/05/17 0849 04/05/17 1157 04/05/17 1703 04/05/17 2144 04/06/17 0743  GLUCAP 113* 231* 220* 182* 138*   Urine analysis:    Component Value Date/Time   COLORURINE YELLOW 04/03/2017 0358   APPEARANCEUR HAZY (A) 04/03/2017 0358   APPEARANCEUR Clear 04/01/2017 1107   LABSPEC 1.011 04/03/2017 0358   PHURINE 5.0 04/03/2017 0358   GLUCOSEU 50 (A) 04/03/2017 0358   HGBUR SMALL (A) 04/03/2017 0358   BILIRUBINUR NEGATIVE 04/03/2017 0358   BILIRUBINUR neg 04/01/2017 1246   BILIRUBINUR Negative 04/01/2017 1107   KETONESUR NEGATIVE 04/03/2017 0358   PROTEINUR >=300 (A) 04/03/2017 0358   UROBILINOGEN 0.2 04/01/2017 1246   NITRITE NEGATIVE 04/03/2017 0358   LEUKOCYTESUR NEGATIVE 04/03/2017 0358   LEUKOCYTESUR Negative 04/01/2017 1107   Recent Results (from the past 240 hour(s))  Microscopic Examination      Status: Abnormal   Collection Time: 04/01/17 11:07 AM  Result Value Ref Range Status   WBC, UA 0-5 0 - 5 /hpf Final   RBC, UA 11-30 (A) 0 - 2 /hpf Final   Epithelial Cells (non renal) 0-10 0 - 10 /hpf Final   Casts None seen None seen /lpf Final   Crystals Present (A) N/A Final   Crystal Type Amorphous Sediment N/A Final   Mucus, UA Present Not Estab. Final   Bacteria, UA Few None seen/Few Final     Scheduled Meds: . amLODipine  10 mg Oral Daily  . atorvastatin  20 mg Oral q1800  . ferrous gluconate  324 mg Oral BID  . furosemide  80 mg Oral BID  . [START ON 04/08/2017] heparin  5,000 Units Subcutaneous Q8H  . insulin aspart  0-15 Units Subcutaneous TID WC  . insulin aspart  0-5 Units Subcutaneous QHS  . metoprolol tartrate  25 mg Oral BID     LOS: 3 days   Time spent: 25 minutes.  Vance Gather, MD [facility] Pager [phone]  If 7PM-7AM, please contact night-coverage [email or web address] Password [facility] 04/06/2017,  11:21 AM

## 2017-04-06 NOTE — Progress Notes (Signed)
Impression: 1. Renal failure/ proteinuria/ LE edema - progressive the last 8-12 months. For renal biopsy by IR 2 Vol excess - down, reduce PO Lasix 3 IDDM 4 HTN    Subjective: Interval History: none.  Objective: Vital signs in last 24 hours: Temp:  [98.5 F (36.9 C)-98.7 F (37.1 C)] 98.7 F (37.1 C) (09/04 2054) Pulse Rate:  [66-74] 74 (09/04 2054) Resp:  [18] 18 (09/04 2054) BP: (138-147)/(65-73) 146/73 (09/04 2054) SpO2:  [96 %-99 %] 96 % (09/04 2054) Weight:  [62.1 kg (137 lb)] 62.1 kg (137 lb) (09/04 0513) Weight change: -0.544 kg (-1 lb 3.2 oz)  Intake/Output from previous day: 09/03 0701 - 09/04 0700 In: 720 [P.O.:720] Out: 300 [Urine:300] Intake/Output this shift: No intake/output data recorded.  General appearance: alert and cooperative GI: soft, non-tender; bowel sounds normal; no masses,  no organomegaly Extremities: edema tr to 1+  Lab Results:  Recent Labs  04/04/17 0556  WBC 6.3  HGB 8.2*  HCT 24.9*  PLT 218   BMET:  Recent Labs  04/04/17 0556 04/06/17 0541  NA 138 138  K 4.5 4.1  CL 110 107  CO2 21* 24  GLUCOSE 109* 156*  BUN 70* 65*  CREATININE 4.69* 4.77*  CALCIUM 8.6* 8.4*   No results for input(s): PTH in the last 72 hours. Iron Studies: No results for input(s): IRON, TIBC, TRANSFERRIN, FERRITIN in the last 72 hours. Studies/Results: No results found.  Scheduled: . amLODipine  10 mg Oral Daily  . atorvastatin  20 mg Oral q1800  . ferrous gluconate  324 mg Oral BID  . [START ON 04/07/2017] furosemide  40 mg Oral BID  . [START ON 04/08/2017] heparin  5,000 Units Subcutaneous Q8H  . insulin aspart  0-15 Units Subcutaneous TID WC  . insulin aspart  0-5 Units Subcutaneous QHS  . metoprolol tartrate  25 mg Oral BID     LOS: 3 days   Thanos Cousineau C 04/06/2017,9:04 PM

## 2017-04-06 NOTE — Progress Notes (Signed)
Inpatient Diabetes Program Recommendations  AACE/ADA: New Consensus Statement on Inpatient Glycemic Control (2015)  Target Ranges:  Prepandial:   less than 140 mg/dL      Peak postprandial:   less than 180 mg/dL (1-2 hours)      Critically ill patients:  140 - 180 mg/dL   Lab Results  Component Value Date   GLUCAP 138 (H) 04/06/2017   HGBA1C 8.0 (H) 01/14/2017    Review of Glycemic Control  Results for Blake Santana, Blake Santana (MRN [id number]) as of 04/06/2017 09:23  Ref. Range 04/04/2017 20:27 04/05/2017 11:57 04/05/2017 17:03 04/05/2017 21:44 04/06/2017 07:43  Glucose-Capillary Latest Ref Range: 65 - 99 mg/dL 187 (H) 231 (H) 220 (H) 182 (H) 138 (H)    Diabetes history: Type 2 Outpatient Diabetes medications: Glipizide 10mg  once a day (from medical record)  Current orders for Inpatient glycemic control: Novolog 0-5 units qhs, Novolog 0-15 units tid  Inpatient Diabetes Program Recommendations:   Agree with current medications for blood sugar management.   Gentry Fitz, RN, BA, MHA, CDE Diabetes Coordinator Inpatient Diabetes Program  [phone] (Team Pager) [phone] ([place]) 04/06/2017 9:26 AM

## 2017-04-06 NOTE — Consult Note (Signed)
Chief Complaint: Patient was seen in consultation today for random renal biopsy Chief Complaint  Patient presents with  . Emesis  . Shortness of Breath   at the request of Dr Hulen Luster  Referring Physician(s): Dr Mathews Robinsons  Supervising Physician: Marybelle Killings  Patient Status: [facility] - In-pt  History of Present Illness: Blake Santana is a 46 y.o. male   Nephrotic range proteinuria Progressive x 1 yr Renal failure LE edema Hx DM  Requesting random renal biopsy per Nephrology   Past Medical History:  Diagnosis Date  . Anemia of chronic kidney failure 03/11/2017  . Chronic kidney disease 03/11/2017  . Diabetes mellitus without complication (Gross) 9562  . Edema 01/14/2017  . Hypertension 03/11/2017  . Iron deficiency anemia 06/18/2016    History reviewed. No pertinent surgical history.  Allergies: Patient has no known allergies.  Medications: Prior to Admission medications   Medication Sig Start Date End Date Taking? Authorizing Provider  amLODipine (NORVASC) 5 MG tablet Take 1 tablet (5 mg total) by mouth daily. Patient taking differently: Take 10 mg by mouth daily.  03/26/17  Yes Mack Hook, MD  atorvastatin (LIPITOR) 20 MG tablet 1 tab by mouth daily with evening meal 03/11/17  Yes Mack Hook, MD  Blood Glucose Monitoring Suppl (AGAMATRIX PRESTO) w/Device KIT Check sugars twice daily 06/04/16  Yes Mack Hook, MD  ferrous gluconate (FERGON) 225 (27 Fe) MG tablet Take 240 mg by mouth 2 (two) times daily.   Yes [provider]  furosemide (LASIX) 20 MG tablet Take 1 tablet (20 mg total) by mouth daily. Patient taking differently: Take 40 mg by mouth daily.  01/14/17  Yes Mack Hook, MD  glipiZIDE (GLUCOTROL) 5 MG tablet 1 tab by mouth twice daily with meals Patient taking differently: Take 10 mg by mouth daily before breakfast.  03/11/17  Yes Mack Hook, MD  glucose blood (AGAMATRIX PRESTO TEST) test strip Check sugars  twice daily 06/04/16  Yes Mack Hook, MD  metoprolol tartrate (LOPRESSOR) 25 MG tablet Take 1 tablet (25 mg total) by mouth 2 (two) times daily. 03/11/17  Yes Mack Hook, MD     Family History  Problem Relation Age of Onset  . Diabetes Mother   . Kidney disease Mother        renal failure--diabetes, cause of death  . Hypertension Mother   . Diabetes Father   . Kidney disease Father        Cause of death:  kidney failure form diabetes  . Alcohol abuse Father   . Diabetes Sister     Social History   Social History  . Marital status: Married    Spouse name: [name]  . Number of children: 1  . Years of education: 9   Occupational History  . [employer]    Social History Main Topics  . Smoking status: Former Research scientist (life sciences)  . Smokeless tobacco: Never Used  . Alcohol use No     Comment: Used to drink too much once weekly, but nothing for 1 year.  . Drug use: No  . Sexual activity: Not Asked   Other Topics Concern  . None   Social History Narrative   Originally from [place]   Came to [place] In [place] with his girlfriend and their son.         Review of Systems: A 12 point ROS discussed and pertinent positives are indicated in the HPI above.  All other systems are negative.  Review of Systems  Constitutional: Positive for fatigue. Negative for activity change, appetite change and fever.  HENT: Positive for sore throat.   Respiratory: Negative for cough and shortness of breath.   Gastrointestinal: Negative for abdominal pain.  Neurological: Negative for weakness.  Psychiatric/Behavioral: Negative for behavioral problems and confusion.    Vital Signs: BP (!) 147/71 (BP Location: Right Arm)   Pulse 72   Temp 98.5 F (36.9 C) (Oral)   Resp 18   Wt 137 lb (62.1 kg)   SpO2 97%   BMI 24.27 kg/m   Physical Exam  Constitutional: He is oriented to person, place, and time.  Cardiovascular: Normal rate, regular rhythm and normal heart sounds.     Pulmonary/Chest: Effort normal and breath sounds normal.  Abdominal: Soft. Bowel sounds are normal.  Musculoskeletal: Normal range of motion.  Neurological: He is alert and oriented to person, place, and time.  Skin: Skin is warm and dry.  Psychiatric: He has a normal mood and affect. His behavior is normal. Judgment and thought content normal.  Consented througg interpreter [employer]  Nursing note and vitals reviewed.   Mallampati Score:  MD Evaluation Airway: WNL Heart: WNL Abdomen: WNL Chest/ Lungs: WNL ASA  Classification: 3 Mallampati/Airway Score: One  Imaging: Dg Chest 2 View  Result Date: 04/02/2017 CLINICAL DATA:  Swelling of the feet, legs and pelvic area. EXAM: CHEST  2 VIEW COMPARISON:  None. FINDINGS: Cardiomegaly is noted without overt pulmonary edema. No pneumonic consolidation, effusion or pneumothorax. Streaky atelectasis and/or scarring is noted in the right middle lobe and left lung base. The aorta is not aneurysmal. No acute nor suspicious osseous abnormality. IMPRESSION: Cardiomegaly without acute pulmonary abnormality. Streaky atelectasis and/or scarring is seen in the right middle lobe and left lung base. Electronically Signed   By: Ashley Royalty M.D.   On: 04/02/2017 20:04   US Abdomen Complete  Result Date: 04/03/2017 CLINICAL DATA:  Acute renal failure EXAM: ABDOMEN ULTRASOUND COMPLETE COMPARISON:  None available FINDINGS: Gallbladder: No gallstones or wall thickening visualized. No sonographic Murphy sign noted by sonographer. Common bile duct: Diameter: 2.5 mm Liver: No focal lesion identified. Within normal limits in parenchymal echogenicity. Portal vein is patent on color Doppler imaging with normal direction of blood flow towards the liver. IVC: No abnormality visualized. Pancreas: Proximal pancreas unremarkable. Pancreatic tail obscured by bowel gas Spleen: Size and appearance within normal limits. Right Kidney: Length: 13.3 cm. Echogenicity within normal  limits. No mass or hydronephrosis visualized. Left Kidney: Length: 11.6 cm. Echogenicity within normal limits. No mass or hydronephrosis visualized. Abdominal aorta: No aneurysm visualized. Other findings: No ascites or free fluid IMPRESSION: No acute finding by abdominal ultrasound. Electronically Signed   By: Jerilynn Mages.  Shick M.D.   On: 04/03/2017 09:52    Labs:  CBC:  Recent Labs  01/14/17 1046 04/02/17 1901 04/03/17 0341 04/04/17 0556  WBC 7.0 7.7 7.3 6.3  HGB 8.4* 8.6* 8.5* 8.2*  HCT 26.0* 26.7* 25.8* 24.9*  PLT 236 218 211 218    COAGS: No results for input(s): INR, APTT in the last 8760 hours.  BMP:  Recent Labs  04/02/17 1901 04/03/17 0341 04/04/17 0556 04/06/17 0541  NA 136 138 138 138  K 5.2* 4.9 4.5 4.1  CL 109 112* 110 107  CO2 19* 18* 21* 24  GLUCOSE 146* 110* 109* 156*  BUN 72* 71* 70* 65*  CALCIUM 8.5* 8.6* 8.6* 8.4*  CREATININE 4.88* 4.70* 4.69* 4.77*  GFRNONAA 13* 13* 13* 13*  GFRAA 15* 16* 16* 15*    LIVER FUNCTION TESTS:  Recent Labs  01/14/17 1046 03/04/17 1011 03/11/17 1236 04/03/17 0203  BILITOT 0.2 0.2 0.3 0.4  AST '14 16 20 20  ' ALT '8 11 18 30  ' ALKPHOS 79 88 115 115  PROT 6.0 6.5 6.8 6.7  ALBUMIN 3.3* 3.3* 3.4* 2.8*    TUMOR MARKERS: No results for input(s): AFPTM, CEA, CA199, CHROMGRNA in the last 8760 hours.  Assessment and Plan:  Nephrotic range proteinuria HTN Renal failure Scheduled for random renal biopsy 9/5 Risks and benefits discussed with the patient and family through [employer] computer interpreter including, but not limited to bleeding, infection, damage to adjacent structures or low yield requiring additional tests. All of the patient's questions were answered, patient is agreeable to proceed. Consent signed and in chart.  Thank you for this interesting consult.  I greatly enjoyed meeting Jacarie Pate and look forward to participating in their care.  A copy of this report was sent to the requesting provider on  this date.  Electronically Signed: Lavonia Drafts, PA-C 04/06/2017, 9:18 AM   I spent a total of 40 Minutes    in face to face in clinical consultation, greater than 50% of which was counseling/coordinating care for random renal biopsy

## 2017-04-07 ENCOUNTER — Inpatient Hospital Stay (HOSPITAL_COMMUNITY): Payer: Medicaid Other

## 2017-04-07 DIAGNOSIS — R0601 Orthopnea: Secondary | ICD-10-CM

## 2017-04-07 DIAGNOSIS — N179 Acute kidney failure, unspecified: Secondary | ICD-10-CM

## 2017-04-07 LAB — GLUCOSE, CAPILLARY
GLUCOSE-CAPILLARY: 145 mg/dL — AB (ref 65–99)
GLUCOSE-CAPILLARY: 241 mg/dL — AB (ref 65–99)
Glucose-Capillary: 156 mg/dL — ABNORMAL HIGH (ref 65–99)
Glucose-Capillary: 160 mg/dL — ABNORMAL HIGH (ref 65–99)

## 2017-04-07 LAB — CBC
HEMATOCRIT: 27.8 % — AB (ref 39.0–52.0)
Hemoglobin: 9.1 g/dL — ABNORMAL LOW (ref 13.0–17.0)
MCH: 27.6 pg (ref 26.0–34.0)
MCHC: 32.7 g/dL (ref 30.0–36.0)
MCV: 84.2 fL (ref 78.0–100.0)
Platelets: 228 10*3/uL (ref 150–400)
RBC: 3.3 MIL/uL — ABNORMAL LOW (ref 4.22–5.81)
RDW: 13.8 % (ref 11.5–15.5)
WBC: 7.3 10*3/uL (ref 4.0–10.5)

## 2017-04-07 LAB — PROTIME-INR
INR: 1.02
Prothrombin Time: 13.4 seconds (ref 11.4–15.2)

## 2017-04-07 LAB — APTT: aPTT: 34 seconds (ref 24–36)

## 2017-04-07 MED ORDER — MIDAZOLAM HCL 2 MG/2ML IJ SOLN
INTRAMUSCULAR | Status: AC
  Filled 2017-04-07: qty 2

## 2017-04-07 MED ORDER — LIDOCAINE-EPINEPHRINE 1 %-1:100000 IJ SOLN
INTRAMUSCULAR | Status: AC
  Filled 2017-04-07: qty 1

## 2017-04-07 MED ORDER — MIDAZOLAM HCL 2 MG/2ML IJ SOLN
INTRAMUSCULAR | Status: AC | PRN
  Administered 2017-04-07: 1 mg via INTRAVENOUS

## 2017-04-07 MED ORDER — FENTANYL CITRATE (PF) 100 MCG/2ML IJ SOLN
INTRAMUSCULAR | Status: AC | PRN
  Administered 2017-04-07: 50 ug via INTRAVENOUS

## 2017-04-07 MED ORDER — FENTANYL CITRATE (PF) 100 MCG/2ML IJ SOLN
INTRAMUSCULAR | Status: AC
  Filled 2017-04-07: qty 2

## 2017-04-07 MED ORDER — GELATIN ABSORBABLE 12-7 MM EX MISC
CUTANEOUS | Status: AC
  Filled 2017-04-07: qty 1

## 2017-04-07 NOTE — Progress Notes (Signed)
PROGRESS NOTE  [name]  TTS:[id number] DOB: 1971/08/02 DOA: 04/03/2017 PCP: Mack Hook, MD   Brief Narrative: Blake Santana is a 46 y.o. male with a history of stage IV CKD, HTN, and IDDM who presented 9/1 with progressive dyspnea and lower extremity edema found to have anasarca and nephrotic range proteinuria. IV lasix was started with poor urine output. Nephrology consulted, augmented diuresis with improvement in edema and urine output. Serologies were sent and have returned unremarkable. Renal biopsy was performed 9/5. Diuresis was converted back to oral lasix 9/4.   Assessment & Plan: Principal Problem:   Anasarca Active Problems:   Uncontrolled type 2 diabetes mellitus with complication, without long-term current use of insulin (HCC)   Anemia of chronic kidney failure   Hypertension   Acute CHF (congestive heart failure) (HCC)  Nephrotic syndrome and stage IV CKD: Nephrology believes rate of creatinine rise greater than typical for diabetic nephropathy alone. U/S showed 11.6 / 13.3cm normal echotexture, no hydronephrosis.  - Appreciate nephrology assistance, diuresing, switched to po lasix.  - Serologies: Complement (C4 and C3) normal.  - ANA, IFA, ANCA Ab's, SPEP negative/unremarkable - Renal biopsy performed 9/5. Will plan to follow up with [facility], possibly Spanish fluent provider, to follow up results.   Anemia of chronic disease and iron deficiency anemia: Stable with hgb 8-9.  - Monitor hgb in AM - Continue home iron  Acute HFpEF: Echo 9/1 with EF 50-55%, G2DD, normal wall motion.  - Lasix 40mg  PO BID, titrated down today.  - Daily weights, strict I/O - Tx HTN  HTN: Chronic, stable - Continue metoprolol 25mg  BID, norvasc 10mg   T2DM: Prolonged (15-20 yrs), HbA1c 8% in June 2018.  - Increase to Mod SSI, added HS scale - Continue statin - Holding home glipizide  DVT prophylaxis: Subcutaneous heparin Code Status: Full Family Communication: Cousin  at bedside Disposition Plan: Home pending further workup and diuresis, anticipate 9/6.  Consultants:   Nephrology  Procedures:   Echocardiogram 04/03/2017: Study Conclusions - Left ventricle: The cavity size was normal. Wall thickness was   normal. Systolic function was normal. The estimated ejection   fraction was in the range of 50% to 55%. Wall motion was normal;   there were no regional wall motion abnormalities. Features are   consistent with a pseudonormal left ventricular filling pattern,   with concomitant abnormal relaxation and increased filling   pressure (grade 2 diastolic dysfunction). Doppler parameters are   consistent with high ventricular filling pressure. - Mitral valve: There was mild regurgitation. - Left atrium: The atrium was moderately dilated. - Pulmonary arteries: Systolic pressure was mildly increased. PA   peak pressure: 35 mm Hg (S). - Pericardium, extracardiac: A small pericardial effusion was   identified.  Impressions: - Normal LV systolic function; moderate diastolic dysfunction;   elevated LV filling pressure; mild MR; moderate LAE; mild TR with   mildly elevated pulmonary pressure; small pericardial effusion.  Antimicrobials:  None   Subjective: No complaints this morning. No trouble breathing.   Objective: BP (!) 156/71   Pulse (!) 16   Temp 98.1 F (36.7 C) (Oral)   Resp (!) 75   Wt 61.5 kg (135 lb 8 oz)   SpO2 99%   BMI 24.00 kg/m   Gen: 46 y.o. male in no distress, sitting up in bed. Pulm: Non-labored breathing room air. Clear to auscultation bilaterally.  CV: Regular rate and rhythm. No murmur, rub, or gallop. No JVD, trace dependent LE edema.  GI:  Abdomen soft, non-tender, non-distended, with normoactive bowel sounds. No organomegaly or masses felt. Ext: Warm, no deformities Skin: No rashes, lesions no ulcers Neuro: Alert and oriented. No focal neurological deficits. Psych: Judgement and insight appear normal. Mood & affect  appropriate.   Data Reviewed: I have personally reviewed following labs and imaging studies  CBC:  Recent Labs Lab 04/02/17 1901 04/03/17 0341 04/04/17 0556 04/07/17 0540  WBC 7.7 7.3 6.3 7.3  HGB 8.6* 8.5* 8.2* 9.1*  HCT 26.7* 25.8* 24.9* 27.8*  MCV 85.3 83.8 83.3 84.2  PLT 218 211 218 810   Basic Metabolic Panel:  Recent Labs Lab 04/01/17 1107 04/02/17 1901 04/03/17 0341 04/04/17 0556 04/06/17 0541  NA 142 136 138 138 138  K 5.5* 5.2* 4.9 4.5 4.1  CL 110* 109 112* 110 107  CO2 16* 19* 18* 21* 24  GLUCOSE 46* 146* 110* 109* 156*  BUN 61* 72* 71* 70* 65*  CREATININE 4.67* 4.88* 4.70* 4.69* 4.77*  CALCIUM 9.0 8.5* 8.6* 8.6* 8.4*  PHOS  --   --  5.0*  --   --    GFR: Estimated Creatinine Clearance: 15.2 mL/min (A) (by C-G formula based on SCr of 4.77 mg/dL (H)). Liver Function Tests:  Recent Labs Lab 04/03/17 0203  AST 20  ALT 30  ALKPHOS 115  BILITOT 0.4  PROT 6.7  ALBUMIN 2.8*   Cardiac Enzymes:  Recent Labs Lab 04/03/17 0341 04/03/17 1114 04/03/17 1533  TROPONINI <0.03 <0.03 <0.03   CBG:  Recent Labs Lab 04/06/17 1204 04/06/17 1655 04/06/17 2052 04/07/17 0803 04/07/17 1214  GLUCAP 243* 148* 165* 145* 156*   Urine analysis:    Component Value Date/Time   COLORURINE YELLOW 04/03/2017 0358   APPEARANCEUR HAZY (A) 04/03/2017 0358   APPEARANCEUR Clear 04/01/2017 1107   LABSPEC 1.011 04/03/2017 0358   PHURINE 5.0 04/03/2017 0358   GLUCOSEU 50 (A) 04/03/2017 0358   HGBUR SMALL (A) 04/03/2017 0358   BILIRUBINUR NEGATIVE 04/03/2017 0358   BILIRUBINUR neg 04/01/2017 1246   BILIRUBINUR Negative 04/01/2017 1107   KETONESUR NEGATIVE 04/03/2017 0358   PROTEINUR >=300 (A) 04/03/2017 0358   UROBILINOGEN 0.2 04/01/2017 1246   NITRITE NEGATIVE 04/03/2017 0358   LEUKOCYTESUR NEGATIVE 04/03/2017 0358   LEUKOCYTESUR Negative 04/01/2017 1107   Recent Results (from the past 240 hour(s))  Microscopic Examination     Status: Abnormal   Collection  Time: 04/01/17 11:07 AM  Result Value Ref Range Status   WBC, UA 0-5 0 - 5 /hpf Final   RBC, UA 11-30 (A) 0 - 2 /hpf Final   Epithelial Cells (non renal) 0-10 0 - 10 /hpf Final   Casts None seen None seen /lpf Final   Crystals Present (A) N/A Final   Crystal Type Amorphous Sediment N/A Final   Mucus, UA Present Not Estab. Final   Bacteria, UA Few None seen/Few Final     Scheduled Meds: . amLODipine  10 mg Oral Daily  . atorvastatin  20 mg Oral q1800  . fentaNYL      . ferrous gluconate  324 mg Oral BID  . furosemide  40 mg Oral BID  . [START ON 04/08/2017] heparin  5,000 Units Subcutaneous Q8H  . insulin aspart  0-15 Units Subcutaneous TID WC  . insulin aspart  0-5 Units Subcutaneous QHS  . lidocaine-EPINEPHrine      . metoprolol tartrate  25 mg Oral BID  . midazolam         LOS: 4 days  Time spent: 25 minutes.  Vance Gather, MD [facility] Pager [phone]  If 7PM-7AM, please contact night-coverage [email or web address] Password [email or web address] 04/07/2017, 2:56 PM

## 2017-04-07 NOTE — Sedation Documentation (Signed)
Patient is resting comfortably. 

## 2017-04-07 NOTE — Sedation Documentation (Signed)
Language interpretor at bedside

## 2017-04-07 NOTE — Procedures (Signed)
Pre Procedure Dx: Proteinuria Post Procedural Dx: Same  Technically successful US guided biopsy of inferior pole of the left kidney.  EBL: Trace No immediate complications.   Jay Lujuana Kapler, MD Pager #: [phone]   

## 2017-04-07 NOTE — Progress Notes (Signed)
Impression: 1. Renal failure/ proteinuria/ LE edema - progressive the last 8-12 months. Will try to arrange post biopsy visit with a spanish speaking nephrologist 2  Vol excess - down,cont PO Lasix 3 IDDM 4 HTN   Subjective: Interval History: s/p renal biopsy  Objective: Vital signs in last 24 hours: Temp:  [98.1 F (36.7 C)-98.7 F (37.1 C)] 98.1 F (36.7 C) (09/05 0539) Pulse Rate:  [16-88] 16 (09/05 1150) Resp:  [16-75] 75 (09/05 1150) BP: (134-164)/(67-79) 156/71 (09/05 1150) SpO2:  [95 %-100 %] 99 % (09/05 1150) Weight:  [61.5 kg (135 lb 8 oz)] 61.5 kg (135 lb 8 oz) (09/05 0700) Weight change: -0.68 kg (-1 lb 8 oz)  Intake/Output from previous day: No intake/output data recorded. Intake/Output this shift: No intake/output data recorded.  General appearance: alert and cooperative Extremities: edema tr  Lab Results:  Recent Labs  04/07/17 0540  WBC 7.3  HGB 9.1*  HCT 27.8*  PLT 228   BMET:  Recent Labs  04/06/17 0541  NA 138  K 4.1  CL 107  CO2 24  GLUCOSE 156*  BUN 65*  CREATININE 4.77*  CALCIUM 8.4*   No results for input(s): PTH in the last 72 hours. Iron Studies: No results for input(s): IRON, TIBC, TRANSFERRIN, FERRITIN in the last 72 hours. Studies/Results: US Guided Needle Placement  Result Date: 04/07/2017 INDICATION: Proteinuria and worsening renal function of uncertain etiology. Please from ultrasound-guided renal biopsy for tissue diagnostic purposes. EXAM: ULTRASOUND GUIDED RENAL BIOPSY COMPARISON:  Abdominal ultrasound - 04/03/2017 MEDICATIONS: None. ANESTHESIA/SEDATION: Fentanyl 50 mcg IV; Versed 1 mg IV Total Moderate Sedation time: 11 minutes; The patient was continuously monitored during the procedure by the interventional radiology nurse under my direct supervision. COMPLICATIONS: None immediate. PROCEDURE: Informed written consent was obtained from the patient after a discussion of the risks, benefits and alternatives to treatment.  The patient understands and consents the procedure. A timeout was performed prior to the initiation of the procedure. Ultrasound scanning was performed of the bilateral flanks. The inferior pole of the left kidney was selected for biopsy due to location and sonographic window. The procedure was planned. The operative site was prepped and draped in the usual sterile fashion. The overlying soft tissues were anesthetized with 1% lidocaine with epinephrine. A 17 gauge core needle biopsy device was advanced into the inferior cortex of the left kidney and 2 core biopsies were obtained under direct ultrasound guidance. Real time pathologic review confirmed adequate tissue acquisition. Images were saved for documentation purposes. The biopsy device was removed and hemostasis was obtained with manual compression. Post procedural scanning was negative for significant post procedural hemorrhage or additional complication. A dressing was placed. The patient tolerated the procedure well without immediate post procedural complication. IMPRESSION: Technically successful ultrasound guided left renal biopsy. Electronically Signed   By: Sandi Mariscal M.D.   On: 04/07/2017 15:29    Scheduled: . amLODipine  10 mg Oral Daily  . atorvastatin  20 mg Oral q1800  . fentaNYL      . ferrous gluconate  324 mg Oral BID  . furosemide  40 mg Oral BID  . [START ON 04/08/2017] heparin  5,000 Units Subcutaneous Q8H  . insulin aspart  0-15 Units Subcutaneous TID WC  . insulin aspart  0-5 Units Subcutaneous QHS  . lidocaine-EPINEPHrine      . metoprolol tartrate  25 mg Oral BID  . midazolam          LOS: 4 days  Demonte Dobratz C 04/07/2017,3:50 PM

## 2017-04-07 NOTE — Sedation Documentation (Signed)
Patient denies pain and is resting comfortably.  

## 2017-04-08 ENCOUNTER — Ambulatory Visit (HOSPITAL_COMMUNITY): Payer: Self-pay

## 2017-04-08 LAB — CBC
HCT: 27.3 % — ABNORMAL LOW (ref 39.0–52.0)
HEMOGLOBIN: 8.9 g/dL — AB (ref 13.0–17.0)
MCH: 27.5 pg (ref 26.0–34.0)
MCHC: 32.6 g/dL (ref 30.0–36.0)
MCV: 84.3 fL (ref 78.0–100.0)
Platelets: 228 10*3/uL (ref 150–400)
RBC: 3.24 MIL/uL — ABNORMAL LOW (ref 4.22–5.81)
RDW: 14.1 % (ref 11.5–15.5)
WBC: 7.9 10*3/uL (ref 4.0–10.5)

## 2017-04-08 LAB — BASIC METABOLIC PANEL
ANION GAP: 10 (ref 5–15)
BUN: 62 mg/dL — AB (ref 6–20)
CO2: 24 mmol/L (ref 22–32)
Calcium: 8.5 mg/dL — ABNORMAL LOW (ref 8.9–10.3)
Chloride: 105 mmol/L (ref 101–111)
Creatinine, Ser: 4.83 mg/dL — ABNORMAL HIGH (ref 0.61–1.24)
GFR, EST AFRICAN AMERICAN: 15 mL/min — AB (ref >60)
GFR, EST NON AFRICAN AMERICAN: 13 mL/min — AB (ref >60)
GLUCOSE: 117 mg/dL — AB (ref 65–99)
POTASSIUM: 4 mmol/L (ref 3.5–5.1)
SODIUM: 139 mmol/L (ref 135–145)

## 2017-04-08 LAB — GLUCOSE, CAPILLARY
GLUCOSE-CAPILLARY: 112 mg/dL — AB (ref 65–99)
GLUCOSE-CAPILLARY: 209 mg/dL — AB (ref 65–99)

## 2017-04-08 MED ORDER — FUROSEMIDE 40 MG PO TABS: 40.0000 mg | ORAL_TABLET | Freq: Two times a day (BID) | ORAL | 0 refills | Status: DC

## 2017-04-08 NOTE — Progress Notes (Signed)
Pt will see Dr Justin Mend at [facility] [address] [place]; Mon Sept 17 at [place]

## 2017-04-08 NOTE — Discharge Summary (Signed)
Physician Discharge Summary  Blake Santana JOI:[id number] DOB: 08-06-1970 DOA: 04/03/2017  PCP: Mack Hook, MD  Admit date: 04/03/2017 Discharge date: 04/08/2017  Admitted From: Home Disposition: Home   Recommendations for Outpatient Follow-up:  1. Follow up with PCP as scheduled 9/13.  2. Please obtain BMP at follow up. Increased lasix dose to 13m BID 3. Please follow up on the following pending results: Renal biopsy from 9/5.   Home Health: None Equipment/Devices: None Discharge Condition: Stable CODE STATUS: Full Diet recommendation: Carb-modified  Brief/Interim Summary: Blake Mappsis a 46y.o. male with a history of stage IV CKD, HTN, and IDDM who presented 9/1 with progressive dyspnea and lower extremity edema found to have anasarca and nephrotic range proteinuria. IV lasix was started with poor urine output. Nephrology consulted, augmented diuresis with improvement in edema and urine output. Serologies were sent and have returned unremarkable. Renal biopsy was performed 9/5. Diuresis was converted back to oral lasix 9/4 with ongoing stability of creatinine and volume status.  Discharge Diagnoses:  Principal Problem:   Anasarca Active Problems:   Uncontrolled type 2 diabetes mellitus with complication, without long-term current use of insulin (HCC)   Anemia of chronic kidney failure   Hypertension   Acute CHF (congestive heart failure) (HCC)  Nephrotic syndrome and stage IV CKD: Nephrology believes rate of creatinine rise greater than typical for diabetic nephropathy alone. U/S showed 11.6 / 13.3cm normal echotexture, no hydronephrosis.  - Appreciate nephrology assistance, diuresing, switched to po lasix, 459mpo BID.  - Serologies: Complement (C4 and C3) normal.  - ANA, IFA, ANCA Ab's, SPEP negative/unremarkable - Renal biopsy performed 9/5. Will plan to follow up with [facility], possibly Spanish fluent provider, to follow up results.   Anemia of chronic  disease and iron deficiency anemia: Stable with hgb 8-9.  - Monitor hgb  - Continue home iron  Acute HFpEF: Echo 9/1 with EF 50-55%, G2DD, normal wall motion.  - Lasix 4015mO BID   - Dietary instructions provided  HTN: Chronic, stable - Continue metoprolol 26m53mD, norvasc 10mg89mDM: Prolonged (15-20 yrs), HbA1c 8% in June 2018.  - Continue home [facility], [place] need PCP follow up  Discharge Instructions Discharge Instructions    Diet Carb Modified    Complete by:  As directed    Discharge instructions    Complete by:  As directed    You were admitted for kidney failure that has improved with increase in lasix dose.  - Start taking more lasix: 40mg 86me daily. A new prescription for 40mg w40ment to the pharmacy. You will only need to take 1 of these tablets twice daily.  - Continue taking other medications as you were.  - You will follow up with the nephrologist ([facility] don't hear from their office in the next week, please call the number listed below to schedule an appointment. They have Spanish-speaking providers.  - If your swelling return or you get a fever or redness at the biopsy site, or you develop trouble breathing, seek medical care right away.     Allergies as of 04/08/2017   No Known Allergies     Medication List    TAKE these medications   AGAMATRIX PRESTO w/Device Kit Check sugars twice daily   amLODipine 5 MG tablet Commonly known as:  NORVASC Take 1 tablet (5 mg total) by mouth daily. What changed:  how much to take   atorvastatin 20 MG tablet  Commonly known as:  LIPITOR 1 tab by mouth daily with evening meal   ferrous gluconate 225 (27 Fe) MG tablet Commonly known as:  FERGON Take 240 mg by mouth 2 (two) times daily.   furosemide 40 MG tablet Commonly known as:  LASIX Take 1 tablet (40 mg total) by mouth 2 (two) times daily. What changed:  medication strength  how much to take  when to take this    glipiZIDE 5 MG tablet Commonly known as:  GLUCOTROL 1 tab by mouth twice daily with meals What changed:  how much to take  how to take this  when to take this  additional instructions   glucose blood test strip Commonly known as:  AGAMATRIX PRESTO TEST Check sugars twice daily   metoprolol tartrate 25 MG tablet Commonly known as:  LOPRESSOR Take 1 tablet (25 mg total) by mouth 2 (two) times daily.            Discharge Care Instructions        Start     Ordered   04/08/17 0000  furosemide (LASIX) 40 MG tablet  2 times daily     04/08/17 1116   04/08/17 0000  Diet Carb Modified     04/08/17 1116   04/08/17 0000  Discharge instructions    Comments:  You were admitted for kidney failure that has improved with increase in lasix dose.  - Start taking more lasix: 45m twice daily. A new prescription for 425mwas sent to the pharmacy. You will only need to take 1 of these tablets twice daily.  - Continue taking other medications as you were.  - You [facility] office in the next week, please call the number listed below to schedule an appointment. They have Spanish-speaking providers.  - If your swelling return or you get a fever or redness at the biopsy site, or you develop trouble breathing, seek medical care right away.   04/08/17 1116     Follow-up Information    [facility].   Why:  post hospital follow up scheduled for 04/15/2017 at 3:30pm with AnFreeman CaldronP Contact information: [phone]       WeEdrick OhMD. Call in 1 week(s).   Specialty:  Nephrology Contact information: [address] [phone]        No Known Allergies  Consultations:  Nephrology, Dr. ScJonnie Finnernd Dr. PoFlorene GlenProcedures/Studies: Dg Chest 2  View  Result Date: 04/02/2017 CLINICAL DATA:  Swelling of the feet, legs and pelvic area. EXAM: CHEST  2 VIEW COMPARISON:  None. FINDINGS: Cardiomegaly is noted without overt pulmonary edema. No pneumonic consolidation, effusion or pneumothorax. Streaky atelectasis and/or scarring is noted in the right middle lobe and left lung base. The aorta is not aneurysmal. No acute nor suspicious osseous abnormality. IMPRESSION: Cardiomegaly without acute pulmonary abnormality. Streaky atelectasis and/or scarring is seen in the right middle lobe and left lung base. Electronically Signed   By: DaAshley Royalty.D.   On: 04/02/2017 20:04   UsKoreabdomen Complete  Result Date: 04/03/2017 CLINICAL DATA:  Acute renal failure EXAM: ABDOMEN ULTRASOUND COMPLETE COMPARISON:  None available FINDINGS: Gallbladder: No gallstones or wall thickening visualized. No sonographic Murphy sign noted by sonographer. Common bile duct: Diameter: 2.5 mm Liver: No focal lesion  identified. Within normal limits in parenchymal echogenicity. Portal vein is patent on color Doppler imaging with normal direction of blood flow towards the liver. IVC: No abnormality visualized. Pancreas: Proximal pancreas unremarkable. Pancreatic tail obscured by bowel gas Spleen: Size and appearance within normal limits. Right Kidney: Length: 13.3 cm. Echogenicity within normal limits. No mass or hydronephrosis visualized. Left Kidney: Length: 11.6 cm. Echogenicity within normal limits. No mass or hydronephrosis visualized. Abdominal aorta: No aneurysm visualized. Other findings: No ascites or free fluid IMPRESSION: No acute finding by abdominal ultrasound. Electronically Signed   By: Jerilynn Mages.  Shick M.D.   On: 04/03/2017 09:52   US Guided Needle Placement  Result Date: 04/07/2017 INDICATION: Proteinuria and worsening renal function of uncertain etiology. Please from ultrasound-guided renal biopsy for tissue diagnostic purposes. EXAM: ULTRASOUND GUIDED RENAL BIOPSY COMPARISON:   Abdominal ultrasound - 04/03/2017 MEDICATIONS: None. ANESTHESIA/SEDATION: Fentanyl 50 mcg IV; Versed 1 mg IV Total Moderate Sedation time: 11 minutes; The patient was continuously monitored during the procedure by the interventional radiology nurse under my direct supervision. COMPLICATIONS: None immediate. PROCEDURE: Informed written consent was obtained from the patient after a discussion of the risks, benefits and alternatives to treatment. The patient understands and consents the procedure. A timeout was performed prior to the initiation of the procedure. Ultrasound scanning was performed of the bilateral flanks. The inferior pole of the left kidney was selected for biopsy due to location and sonographic window. The procedure was planned. The operative site was prepped and draped in the usual sterile fashion. The overlying soft tissues were anesthetized with 1% lidocaine with epinephrine. A 17 gauge core needle biopsy device was advanced into the inferior cortex of the left kidney and 2 core biopsies were obtained under direct ultrasound guidance. Real time pathologic review confirmed adequate tissue acquisition. Images were saved for documentation purposes. The biopsy device was removed and hemostasis was obtained with manual compression. Post procedural scanning was negative for significant post procedural hemorrhage or additional complication. A dressing was placed. The patient tolerated the procedure well without immediate post procedural complication. IMPRESSION: Technically successful ultrasound guided left renal biopsy. Electronically Signed   By: Sandi Mariscal M.D.   On: 04/07/2017 15:29   Subjective: No complaints, biopsy went well, no pain. Swelling subsided, no dyspnea or chest pain.  Discharge Exam: Vitals:   04/08/17 0620 04/08/17 1014  BP: 127/62 (!) 141/66  Pulse: 71 71  Resp: 18   Temp: 98.7 F (37.1 C)   SpO2: 96%    General: Pt is alert, awake, not in acute  distress Cardiovascular: RRR, S1/S2 +, no rubs, no gallops Respiratory: CTA bilaterally, no wheezing, no rhonchi Abdominal: Soft, NT, ND, bowel sounds + Extremities: No lower extremity edema, no cyanosis Skin: Left lower back with puncture site s/p biopsy c/d/i no erythema or drainage.   Labs: BNP (last 3 results)  Recent Labs  04/03/17 0229  BNP 1,275.1*   Basic Metabolic Panel:  Recent Labs Lab 04/02/17 1901 04/03/17 0341 04/04/17 0556 04/06/17 0541 04/08/17 0501  NA 136 138 138 138 139  K 5.2* 4.9 4.5 4.1 4.0  CL 109 112* 110 107 105  CO2 19* 18* 21* 24 24  GLUCOSE 146* 110* 109* 156* 117*  BUN 72* 71* 70* 65* 62*  CREATININE 4.88* 4.70* 4.69* 4.77* 4.83*  CALCIUM 8.5* 8.6* 8.6* 8.4* 8.5*  PHOS  --  5.0*  --   --   --    Liver Function Tests:  Recent Labs Lab 04/03/17 0203  AST 20  ALT 30  ALKPHOS 115  BILITOT 0.4  PROT 6.7  ALBUMIN 2.8*   CBC:  Recent Labs Lab 04/02/17 1901 04/03/17 0341 04/04/17 0556 04/07/17 0540 04/08/17 0501  WBC 7.7 7.3 6.3 7.3 7.9  HGB 8.6* 8.5* 8.2* 9.1* 8.9*  HCT 26.7* 25.8* 24.9* 27.8* 27.3*  MCV 85.3 83.8 83.3 84.2 84.3  PLT 218 211 218 228 228   Cardiac Enzymes:  Recent Labs Lab 04/03/17 0341 04/03/17 1114 04/03/17 1533  TROPONINI <0.03 <0.03 <0.03   CBG:  Recent Labs Lab 04/07/17 0803 04/07/17 1214 04/07/17 1716 04/07/17 2107 04/08/17 0806  GLUCAP 145* 156* 160* 241* 112*   Urinalysis    Component Value Date/Time   COLORURINE YELLOW 04/03/2017 0358   APPEARANCEUR HAZY (A) 04/03/2017 0358   APPEARANCEUR Clear 04/01/2017 1107   LABSPEC 1.011 04/03/2017 0358   PHURINE 5.0 04/03/2017 0358   GLUCOSEU 50 (A) 04/03/2017 0358   HGBUR SMALL (A) 04/03/2017 0358   BILIRUBINUR NEGATIVE 04/03/2017 0358   BILIRUBINUR neg 04/01/2017 1246   BILIRUBINUR Negative 04/01/2017 1107   KETONESUR NEGATIVE 04/03/2017 0358   PROTEINUR >=300 (A) 04/03/2017 0358   UROBILINOGEN 0.2 04/01/2017 1246   NITRITE NEGATIVE  04/03/2017 0358   LEUKOCYTESUR NEGATIVE 04/03/2017 0358   LEUKOCYTESUR Negative 04/01/2017 1107    Microbiology Recent Results (from the past 240 hour(s))  Microscopic Examination     Status: Abnormal   Collection Time: 04/01/17 11:07 AM  Result Value Ref Range Status   WBC, UA 0-5 0 - 5 /hpf Final   RBC, UA 11-30 (A) 0 - 2 /hpf Final   Epithelial Cells (non renal) 0-10 0 - 10 /hpf Final   Casts None seen None seen /lpf Final   Crystals Present (A) N/A Final   Crystal Type Amorphous Sediment N/A Final   Mucus, UA Present Not Estab. Final   Bacteria, UA Few None seen/Few Final    Time coordinating discharge: Approximately 40 minutes  Vance Gather, MD  [facility] 04/08/2017, 11:17 AM Pager [phone]

## 2017-04-08 NOTE — Progress Notes (Signed)
Se habl con el paciente/cuidador sobre las instrucciones para el alta (incluyendo medicamentos) y se le proporcion una copia 

## 2017-04-12 ENCOUNTER — Other Ambulatory Visit (INDEPENDENT_AMBULATORY_CARE_PROVIDER_SITE_OTHER): Payer: Self-pay

## 2017-04-12 DIAGNOSIS — N189 Chronic kidney disease, unspecified: Secondary | ICD-10-CM

## 2017-04-13 LAB — BASIC METABOLIC PANEL
BUN/Creatinine Ratio: 14 (ref 9–20)
BUN: 70 mg/dL — ABNORMAL HIGH (ref 6–24)
CALCIUM: 8.8 mg/dL (ref 8.7–10.2)
CHLORIDE: 104 mmol/L (ref 96–106)
CO2: 19 mmol/L — AB (ref 20–29)
Creatinine, Ser: 5.01 mg/dL — ABNORMAL HIGH (ref 0.76–1.27)
GFR calc Af Amer: 15 mL/min/{1.73_m2} — ABNORMAL LOW (ref >59)
GFR, EST NON AFRICAN AMERICAN: 13 mL/min/{1.73_m2} — AB (ref >59)
Glucose: 99 mg/dL (ref 65–99)
Potassium: 5.2 mmol/L (ref 3.5–5.2)
SODIUM: 138 mmol/L (ref 134–144)

## 2017-04-15 ENCOUNTER — Encounter: Payer: Self-pay | Admitting: Internal Medicine

## 2017-04-15 ENCOUNTER — Inpatient Hospital Stay: Payer: Self-pay

## 2017-04-15 ENCOUNTER — Ambulatory Visit (INDEPENDENT_AMBULATORY_CARE_PROVIDER_SITE_OTHER): Payer: Self-pay | Admitting: Internal Medicine

## 2017-04-15 VITALS — BP 138/68 | HR 78 | Resp 12 | Ht 63.0 in | Wt 135.0 lb

## 2017-04-15 DIAGNOSIS — E118 Type 2 diabetes mellitus with unspecified complications: Secondary | ICD-10-CM

## 2017-04-15 DIAGNOSIS — I10 Essential (primary) hypertension: Secondary | ICD-10-CM

## 2017-04-15 DIAGNOSIS — E1165 Type 2 diabetes mellitus with hyperglycemia: Secondary | ICD-10-CM

## 2017-04-15 DIAGNOSIS — IMO0002 Reserved for concepts with insufficient information to code with codable children: Secondary | ICD-10-CM

## 2017-04-15 DIAGNOSIS — N185 Chronic kidney disease, stage 5: Secondary | ICD-10-CM

## 2017-04-15 LAB — GLUCOSE, POCT (MANUAL RESULT ENTRY): POC Glucose: 303 mg/dl — AB (ref 70–99)

## 2017-04-15 NOTE — Patient Instructions (Signed)
Free flu vaccine at flu vaccine clinics:  September 20 8:30 to 11:30 a.m. And September 29th [facility] 1-4 p.m.  Both at [facility] next door to clinic

## 2017-04-15 NOTE — Progress Notes (Signed)
   Subjective:    Patient ID: Blake Santana, male    DOB: 02-23-1971, 46 y.o.   MRN: [id number]  HPI   1.  Cough:  Since discharged from hospital.  Dry.  No dyspnea.  No fever.  2.  CKD with proteinuria and history of poor DM control:  Sees Dr. Justin Mend of [facility] next Monday.  Potassium 3 days ago at upper limits of normal again at 5.2.  Labs/ultrasound have not pointed toward another particular etiology for his more acute kidney failure.  ACE I stopped due to difficulties with elevated K+.  He did have small amount hemoglobin and RBCs on microscopic before and in hospital. Kidney Biopsy from hospitalization shows:     Significant diabetic glomerulosclerosis, severe arterionephrosclerosis, severe tubulointerstitial scarring,  And secondary focal and segmental glomerular tufts scaring.  Accelerated focally stenosing hypertensive changes in small caliber vessels.  No evidence of immune complex mediated or active glomerulonephritis. Patient states he is feeling better since increased diuresis.  Still with swelling of legs, but not as bad.   3.  Hypertension:  BP is okay today.  4.  DM:  Checking sugars every morning.  Taking Glipizide 5 mg twice daily only now.  Due for A1C.  Since back on medication, has been gradually improving. Metformin discontinued with worsening renal function. Being followed by retinal specialist. Up to date on Pneumococcal vaccine. Discussed upcoming free flu vaccine clinics.  Current Meds  Medication Sig  . amLODipine (NORVASC) 5 MG tablet Take 1 tablet (5 mg total) by mouth daily. (Patient taking differently: Take 10 mg by mouth daily. )  . atorvastatin (LIPITOR) 20 MG tablet 1 tab by mouth daily with evening meal  . furosemide (LASIX) 40 MG tablet Take 1 tablet (40 mg total) by mouth 2 (two) times daily.  Marland Kitchen glipiZIDE (GLUCOTROL) 5 MG tablet 1 tab by mouth twice daily with meals  . metoprolol tartrate (LOPRESSOR) 25 MG tablet Take 1 tablet (25 mg total)  by mouth 2 (two) times daily.       Review of Systems     Objective:   Physical Exam   Pale, chronically ill appearing Lungs:  CTA CV:  RRR without murmur or rub.  Radial pulses normal and equal No JVD. Abd:  S, NT, No HSM or mass, + BS LE:  Edema to high calf/pretibial area.         Assessment & Plan:  1.  More acute on chronic kidney disease:  Await Dr. Jason Nest assessment of biopsy and suggested treatment.  To keep sodium intake to below 2000 mg daily.   No change to Furosemide 40 mg twice daily for now. BMP repeat today with recent borderline K+ again and recurrent low bicarb BP is now much better controlled as well.  DM better controlled in last few months.  Discussed possibility of hemodialysis in future and indications for initiation.  2.  Hypertension:  Improved control with current regimen of Amlodipine and Metoprolol.  3.  DM:  Suspect well controlled now.  Check A1C.    4.  Edema:  Improved with diuresis.  Sodium restriction as above.    5.  Anemia of chronic kidney disease   6. Dyslipidemia:  Atorvastatin increased to 40 mg beginning of August as LDL at goal less than 70.  Recheck in follow up.

## 2017-04-16 ENCOUNTER — Encounter (HOSPITAL_COMMUNITY): Payer: Self-pay

## 2017-04-16 LAB — HGB A1C W/O EAG: Hgb A1c MFr Bld: 6.7 % — ABNORMAL HIGH (ref 4.8–5.6)

## 2017-04-16 LAB — BASIC METABOLIC PANEL
BUN/Creatinine Ratio: 13 (ref 9–20)
BUN: 69 mg/dL — ABNORMAL HIGH (ref 6–24)
CO2: 21 mmol/L (ref 20–29)
CREATININE: 5.49 mg/dL — AB (ref 0.76–1.27)
Calcium: 8.7 mg/dL (ref 8.7–10.2)
Chloride: 105 mmol/L (ref 96–106)
GFR, EST AFRICAN AMERICAN: 13 mL/min/{1.73_m2} — AB (ref >59)
GFR, EST NON AFRICAN AMERICAN: 11 mL/min/{1.73_m2} — AB (ref >59)
Glucose: 267 mg/dL — ABNORMAL HIGH (ref 65–99)
Potassium: 5.1 mmol/L (ref 3.5–5.2)
SODIUM: 140 mmol/L (ref 134–144)

## 2017-04-22 ENCOUNTER — Other Ambulatory Visit: Payer: Self-pay

## 2017-04-22 DIAGNOSIS — N186 End stage renal disease: Secondary | ICD-10-CM

## 2017-04-22 DIAGNOSIS — Z01810 Encounter for preprocedural cardiovascular examination: Secondary | ICD-10-CM

## 2017-04-26 ENCOUNTER — Encounter (HOSPITAL_COMMUNITY): Payer: Self-pay

## 2017-04-29 NOTE — Discharge Instructions (Signed)
Epoetin Alfa injection Qu es este medicamento? La EPOETINA ALFA ayuda estimular la produccin de glbulos rojos en el cuerpo. Este medicamento se [place] para tratar la anemia asociada con la insuficiencia renal crnica y la quimioterapia o la terapia para el VIH. Puede adems utilizarlo antes de [place] en pacientes con anemia. Este medicamento puede ser utilizado para otros usos; si tiene alguna pregunta consulte con su proveedor de atencin mdica o con su farmacutico. MARCAS COMUNES: Epogen, Procrit Qu le debo informar a mi profesional de la salud antes de tomar este medicamento? Necesita saber si usted presenta alguno de los siguientes problemas o situaciones: -trastornos de Proofreader de la sangre -paciente con cncer que no recibe quimioterapia -fibrosis qustica -enfermedades cardiacas, tales como angina de pecho, insuficiencia cardiaca -nivel de hemoglobina de 12 g/dL o ms -alta presin sangunea -niveles bajos de folato, hierro o vitamina B12 -convulsiones -una reaccin alrgica o inusual a la eritropoyetina, a la albmina, al alcohol benclico, a las protenas del Production designer, theatre/television/film, a otros medicamentos, alimentos, colorantes o conservantes -si est embarazada o buscando quedar embarazada -si est amamantando a un beb Cmo debo utilizar este medicamento? Este medicamento se administra mediante inyeccin en una vena o debajo de la piel. Generalmente lo administra un profesional de Technical sales engineer en un hospital o en un entorno clnico. Si recibe este medicamento en su casa, se le ensear cmo prepararlo y Engineer, manufacturing. selo exactamente como se le indique. Tome su medicamento a intervalos regulares. No tome su medicamento con una frecuencia mayor a la indicada. Es importante que deseche las agujas y las jeringas usadas en un recipiente resistente a los pinchazos. No los deseche en la basura. Si no tiene un recipiente resistente a los pinchazos, consulte a Midwife o proveedor de  atencin de la salud para obtenerlo. Su farmacutico le dar una Gua del medicamento especial (MedGuide, nombre en ingls) con cada receta y en cada ocasin que la vuelva a surtir. Asegrese de leer esta informacin cada vez cuidadosamente. Hable con su pediatra para informarse acerca del uso de este medicamento en nios. Aunque este medicamento se puede Advertising account executive, existen precauciones que deben tomarse. Sobredosis: Pngase en contacto inmediatamente con un centro toxicolgico o una sala de urgencia si usted cree que haya tomado demasiado medicamento. ATENCIN: [employer] es solo para usted. No comparta este medicamento con nadie. Qu sucede si me olvido de una dosis? Si olvida una dosis, sela lo antes posible. Si es casi la hora de la prxima dosis, use slo esa dosis. No use dosis adicionales o dobles. Qu puede interactuar con este medicamento? No tome esta medicina con ninguno de los siguientes medicamentos: -darbepoetina alfa Puede ser que esta lista no menciona todas las posibles interacciones. Informe a su profesional de [employer]	[place] de [employer] productos a base de hierbas, medicamentos de [place] o suplementos nutritivos que est tomando. Si usted fuma, consume bebidas alcohlicas o si utiliza drogas ilegales, indqueselo tambin a su profesional de [employer]	[place]. Algunas sustancias pueden interactuar con su medicamento. A qu debo estar atento al usar [employer]? Se supervisar su estado de salud atentamente mientras reciba este medicamento. Usted podra necesitar realizarse [employer] de sangre mientras est usando [place]. Qu efectos secundarios puedo tener al [employer] este medicamento? Efectos secundarios que debe informar a su mdico o a Barrister's clerk de la salud tan pronto como sea posible: -reacciones alrgicas como erupcin cutnea, picazn o urticarias, hinchazn de la cara, labios o lengua -problemas respiratorios -cambios en  la  visin -dolor en el pecho -confusin, dificultad para hablar o entender -sensacin de desmayos o mareos, cadas -alta presin sangunea -molestias o dolores musculares -dolor, hinchazn, sensacin de calor en la pierna -rpido aumento de peso -dolor de Patent examiner -entumecimiento o debilidad repentina de la cara, brazo o pierna -problemas para caminar, mareos, prdida de la coordinacin o equilibrio -convulsiones -hinchazn de pies o tobillos -cansancio o debilidad inusual Efectos secundarios que, por lo general, no requieren atencin mdica (debe informarlos a su mdico o a su profesional de la salud si persisten o si son molestos): -diarrea -fiebre, escalofros (sntomas tipo gripales) -dolor de cabeza -nuseas, vmito -enrojecimiento, escozor o Estate agent de la inyeccin Puede ser que esta lista no menciona todos los posibles efectos secundarios. Comunquese a su mdico por asesoramiento mdico [employer]. Usted puede informar los efectos secundarios a la FDA por telfono al 1-800-FDA-1088. Dnde debo guardar mi medicina? Mantngala fuera del alcance de los nios. Gurdela en un refrigerador, a Energy manager entre 2 y 79 grados C (68 y 64 grados F). No la congele ni la agite. Deseche todo medicamento sin utilizar si utiliza una ampolla de dosis nica. Las [employer] de dosis mltiples pueden Building control surveyor durante 21 das despus de la dosis inicial. Deseche todo el medicamento que no haya utilizado. ATENCIN: Este folleto es un resumen. Puede ser que no cubra toda la posible informacin. Si usted tiene preguntas acerca de esta medicina, consulte con su mdico, su farmacutico o su profesional de Technical sales engineer.  2018 Elsevier/Gold Standard (2016-04-28 00:00:00)

## 2017-04-30 ENCOUNTER — Inpatient Hospital Stay (HOSPITAL_COMMUNITY)
Admission: RE | Admit: 2017-04-30 | Discharge: 2017-04-30 | Disposition: A | Discharge status: Home / Self Care | Payer: Self-pay | Source: Ambulatory Visit | Attending: Nephrology | Admitting: Nephrology

## 2017-04-30 ENCOUNTER — Encounter (HOSPITAL_COMMUNITY): Payer: Self-pay

## 2017-05-03 ENCOUNTER — Encounter (HOSPITAL_COMMUNITY)
Admission: RE | Admit: 2017-05-03 | Discharge: 2017-05-03 | Disposition: A | Discharge status: Home / Self Care | Payer: Self-pay | Source: Ambulatory Visit | Attending: Nephrology | Admitting: Nephrology

## 2017-05-03 DIAGNOSIS — N184 Chronic kidney disease, stage 4 (severe): Secondary | ICD-10-CM | POA: Insufficient documentation

## 2017-05-03 DIAGNOSIS — D631 Anemia in chronic kidney disease: Secondary | ICD-10-CM | POA: Insufficient documentation

## 2017-05-03 LAB — POCT HEMOGLOBIN-HEMACUE: Hemoglobin: 8.4 g/dL — ABNORMAL LOW (ref 13.0–17.0)

## 2017-05-03 MED ORDER — EPOETIN ALFA 10000 UNIT/ML IJ SOLN
INTRAMUSCULAR | Status: AC
Start: 2017-05-03 — End: 2017-05-03
  Filled 2017-05-03: qty 1

## 2017-05-03 MED ORDER — EPOETIN ALFA 10000 UNIT/ML IJ SOLN
5000.0000 [IU] | INTRAMUSCULAR | Status: DC
  Administered 2017-05-03: 11:00:00 5000 [IU] via SUBCUTANEOUS

## 2017-05-11 ENCOUNTER — Encounter (HOSPITAL_COMMUNITY)
Admission: RE | Admit: 2017-05-11 | Discharge: 2017-05-11 | Disposition: A | Discharge status: Home / Self Care | Payer: Self-pay | Source: Ambulatory Visit | Attending: Nephrology | Admitting: Nephrology

## 2017-05-11 DIAGNOSIS — D631 Anemia in chronic kidney disease: Secondary | ICD-10-CM

## 2017-05-11 DIAGNOSIS — N184 Chronic kidney disease, stage 4 (severe): Principal | ICD-10-CM

## 2017-05-11 LAB — POCT HEMOGLOBIN-HEMACUE: Hemoglobin: 8.3 g/dL — ABNORMAL LOW (ref 13.0–17.0)

## 2017-05-11 MED ORDER — EPOETIN ALFA 10000 UNIT/ML IJ SOLN
INTRAMUSCULAR | Status: AC
  Administered 2017-05-11: 10:00:00 5000 [IU]
  Filled 2017-05-11: qty 1

## 2017-05-11 MED ORDER — EPOETIN ALFA 10000 UNIT/ML IJ SOLN: 5000.0000 [IU] | INTRAMUSCULAR | Status: DC

## 2017-05-18 ENCOUNTER — Encounter (HOSPITAL_COMMUNITY)
Admission: RE | Admit: 2017-05-18 | Discharge: 2017-05-18 | Disposition: A | Discharge status: Home / Self Care | Payer: Self-pay | Source: Ambulatory Visit | Attending: Nephrology | Admitting: Nephrology

## 2017-05-18 DIAGNOSIS — D631 Anemia in chronic kidney disease: Secondary | ICD-10-CM

## 2017-05-18 DIAGNOSIS — N184 Chronic kidney disease, stage 4 (severe): Principal | ICD-10-CM

## 2017-05-18 LAB — POCT HEMOGLOBIN-HEMACUE: HEMOGLOBIN: 8.4 g/dL — AB (ref 13.0–17.0)

## 2017-05-18 MED ORDER — EPOETIN ALFA 10000 UNIT/ML IJ SOLN
INTRAMUSCULAR | Status: AC
  Filled 2017-05-18: qty 1

## 2017-05-18 MED ORDER — EPOETIN ALFA 10000 UNIT/ML IJ SOLN
5000.0000 [IU] | INTRAMUSCULAR | Status: DC
  Administered 2017-05-18: 09:00:00 5000 [IU] via SUBCUTANEOUS

## 2017-05-25 ENCOUNTER — Encounter (HOSPITAL_COMMUNITY)
Admission: RE | Admit: 2017-05-25 | Discharge: 2017-05-25 | Disposition: A | Discharge status: Home / Self Care | Payer: Self-pay | Source: Ambulatory Visit | Attending: Nephrology | Admitting: Nephrology

## 2017-05-25 DIAGNOSIS — D631 Anemia in chronic kidney disease: Secondary | ICD-10-CM

## 2017-05-25 DIAGNOSIS — N184 Chronic kidney disease, stage 4 (severe): Principal | ICD-10-CM

## 2017-05-25 LAB — FERRITIN: FERRITIN: 40 ng/mL (ref 24–336)

## 2017-05-25 LAB — IRON AND TIBC
IRON: 19 ug/dL — AB (ref 45–182)
Saturation Ratios: 5 % — ABNORMAL LOW (ref 17.9–39.5)
TIBC: 361 ug/dL (ref 250–450)
UIBC: 342 ug/dL

## 2017-05-25 LAB — POCT HEMOGLOBIN-HEMACUE: HEMOGLOBIN: 8.3 g/dL — AB (ref 13.0–17.0)

## 2017-05-25 MED ORDER — EPOETIN ALFA 10000 UNIT/ML IJ SOLN
5000.0000 [IU] | INTRAMUSCULAR | Status: DC
  Administered 2017-05-25: 5000 [IU] via SUBCUTANEOUS

## 2017-05-25 MED ORDER — EPOETIN ALFA 10000 UNIT/ML IJ SOLN
INTRAMUSCULAR | Status: AC
  Administered 2017-05-25: 09:00:00 5000 [IU] via SUBCUTANEOUS
  Filled 2017-05-25: qty 1

## 2017-05-26 ENCOUNTER — Other Ambulatory Visit (HOSPITAL_COMMUNITY): Payer: Self-pay

## 2017-05-26 ENCOUNTER — Encounter: Payer: Self-pay | Admitting: Vascular Surgery

## 2017-05-26 ENCOUNTER — Encounter (HOSPITAL_COMMUNITY): Payer: Self-pay

## 2017-05-31 ENCOUNTER — Ambulatory Visit (HOSPITAL_COMMUNITY)
Admission: RE | Admit: 2017-05-31 | Discharge: 2017-05-31 | Disposition: A | Discharge status: Home / Self Care | Payer: Self-pay | Source: Ambulatory Visit | Attending: Nephrology | Admitting: Nephrology

## 2017-05-31 DIAGNOSIS — N184 Chronic kidney disease, stage 4 (severe): Secondary | ICD-10-CM | POA: Insufficient documentation

## 2017-05-31 DIAGNOSIS — D631 Anemia in chronic kidney disease: Secondary | ICD-10-CM | POA: Insufficient documentation

## 2017-05-31 LAB — POCT HEMOGLOBIN-HEMACUE: HEMOGLOBIN: 8.4 g/dL — AB (ref 13.0–17.0)

## 2017-05-31 MED ORDER — EPOETIN ALFA 10000 UNIT/ML IJ SOLN
INTRAMUSCULAR | Status: AC
  Administered 2017-05-31: 5000 [IU] via SUBCUTANEOUS
  Filled 2017-05-31: qty 1

## 2017-05-31 MED ORDER — EPOETIN ALFA 10000 UNIT/ML IJ SOLN
5000.0000 [IU] | INTRAMUSCULAR | Status: DC
  Administered 2017-05-31: 5000 [IU] via SUBCUTANEOUS

## 2017-06-07 ENCOUNTER — Other Ambulatory Visit (HOSPITAL_COMMUNITY): Payer: Self-pay | Admitting: *Deleted

## 2017-06-08 ENCOUNTER — Ambulatory Visit (HOSPITAL_COMMUNITY)
Admission: RE | Admit: 2017-06-08 | Discharge: 2017-06-08 | Disposition: A | Discharge status: Home / Self Care | Payer: Self-pay | Source: Ambulatory Visit | Attending: Nephrology | Admitting: Nephrology

## 2017-06-08 VITALS — BP 163/70 | HR 62 | Temp 98.6°F | Resp 18 | Ht 64.0 in | Wt 136.5 lb

## 2017-06-08 DIAGNOSIS — D631 Anemia in chronic kidney disease: Secondary | ICD-10-CM | POA: Insufficient documentation

## 2017-06-08 DIAGNOSIS — N184 Chronic kidney disease, stage 4 (severe): Secondary | ICD-10-CM | POA: Insufficient documentation

## 2017-06-08 DIAGNOSIS — Z79899 Other long term (current) drug therapy: Secondary | ICD-10-CM | POA: Insufficient documentation

## 2017-06-08 DIAGNOSIS — Z5181 Encounter for therapeutic drug level monitoring: Secondary | ICD-10-CM | POA: Insufficient documentation

## 2017-06-08 LAB — POCT HEMOGLOBIN-HEMACUE: Hemoglobin: 8.7 g/dL — ABNORMAL LOW (ref 13.0–17.0)

## 2017-06-08 MED ORDER — FERUMOXYTOL INJECTION 510 MG/17 ML
510.0000 mg | INTRAVENOUS | Status: DC
  Administered 2017-06-08: 510 mg via INTRAVENOUS
  Filled 2017-06-08: qty 17

## 2017-06-08 MED ORDER — EPOETIN ALFA 10000 UNIT/ML IJ SOLN
5000.0000 [IU] | INTRAMUSCULAR | Status: DC
Start: 2017-06-09 — End: 2017-06-09
  Administered 2017-06-08: 13:00:00 5000 [IU] via SUBCUTANEOUS

## 2017-06-08 MED ORDER — EPOETIN ALFA 10000 UNIT/ML IJ SOLN
INTRAMUSCULAR | Status: AC
  Filled 2017-06-08: qty 1

## 2017-06-08 NOTE — Discharge Instructions (Signed)
Ferumoxytol injection Qu es este medicamento? El FERUMOXYTOL es un complejo de hierro. El hierro se utiliza para la produccin de glbulos rojos sanos, los cuales transportan el oxgeno y los nutrientes hacia todo el cuerpo. Este medicamento se utiliza para tratar la anemia por falta de hierro a las personas con enfermedad renal crnica. Este medicamento puede ser utilizado para otros usos; si tiene alguna pregunta consulte con su proveedor de atencin mdica o con su farmacutico. MARCAS COMUNES: Feraheme Qu le debo informar a mi profesional de la salud antes de tomar este medicamento? Necesita saber si usted presenta alguno de los siguientes problemas o situaciones: -anemia no provocada por niveles bajos de hierro -niveles altos de hierro en la sangre -examen de imgenes por resonancia magntica (IRM) programado -una reaccin alrgica o inusual al hierro, otros medicamentos, alimentos, colorantes o conservantes -si est embarazada o buscando quedar embarazada -si est amamantando a un beb Cmo debo utilizar este medicamento? Este medicamento se administra mediante inyeccin por va intravenosa. Lo administra un profesional de la salud en un hospital o en un entorno clnico. Hable con su pediatra para informarse acerca del uso de este medicamento en nios. Puede requerir atencin especial. Sobredosis: Pngase en contacto inmediatamente con un centro toxicolgico o una sala de urgencia si usted cree que haya tomado demasiado medicamento. ATENCIN: Este medicamento es solo para usted. No comparta este medicamento con nadie. Qu sucede si me olvido de una dosis? Es importante no olvidar ninguna dosis. Informe a su mdico o a su profesional de la salud si no puede asistir a una cita. Qu puede interactuar con este medicamento? Esta medicina puede interactuar con los siguientes medicamentos: -otros productos de hierro Puede ser que esta lista no menciona todas las posibles interacciones.  Informe a su profesional de la salud de todos los productos a base de hierbas, medicamentos de venta libre o suplementos nutritivos que est tomando. Si usted fuma, consume bebidas alcohlicas o si utiliza drogas ilegales, indqueselo tambin a su profesional de la salud. Algunas sustancias pueden interactuar con su medicamento. A qu debo estar atento al usar este medicamento? Visite a su mdico o a su profesional de la salud de manera regular. Si los sntomas no comienzan a mejorar o si empeoran, consulte con su mdico o con su profesional de la salud. Tal vez necesita realizarse anlisis de sangre mientras recibe este medicamento. Tal vez necesita seguir una dieta especial. Consulte a su mdico. Los alimentos que contienen hierro incluyen: alimentos integrales o con cereales, frutas secas, frijoles o arvejas, vegetales de hoja verde y carne que proviene de rganos (hgado, rin). Qu efectos secundarios puedo tener al utilizar este medicamento? Efectos secundarios que debe informar a su mdico o a su profesional de la salud tan pronto como sea posible: reacciones alrgicas, como erupcin cutnea, picazn o urticarias, e hinchazn de la cara, los labios o la lengua problemas respiratorios cambios en la presin sangunea sensacin de desmayos o aturdimiento, cadas fiebre o escalofros enrojecimiento, sudoracin o sensacin de calor hinchazn de tobillos o pies Efectos secundarios que generalmente no requieren atencin mdica (infrmelos a su mdico o a su profesional de la salud si persisten o si son molestos): diarrea dolor de cabeza nuseas, vmito dolor estomacal Puede ser que esta lista no menciona todos los posibles efectos secundarios. Comunquese a su mdico por asesoramiento mdico sobre los efectos secundarios. Usted puede informar los efectos secundarios a la FDA por telfono al 1-800-FDA-1088. Dnde debo guardar mi medicina? Este medicamento se administra en hospitales   o clnicas y no  necesitar guardarlo en su domicilio. ATENCIN: Este folleto es un resumen. Puede ser que no cubra toda la posible informacin. Si usted tiene preguntas acerca de esta medicina, consulte con su mdico, su farmacutico o su profesional de la salud.  2018 Elsevier/Gold Standard (2015-11-04 00:00:00)  

## 2017-06-15 ENCOUNTER — Encounter (HOSPITAL_COMMUNITY)
Admission: RE | Admit: 2017-06-15 | Discharge: 2017-06-15 | Disposition: A | Discharge status: Home / Self Care | Payer: Self-pay | Source: Ambulatory Visit | Attending: Nephrology | Admitting: Nephrology

## 2017-06-15 VITALS — BP 174/61 | HR 78 | Temp 98.6°F | Resp 18

## 2017-06-15 DIAGNOSIS — N184 Chronic kidney disease, stage 4 (severe): Secondary | ICD-10-CM | POA: Insufficient documentation

## 2017-06-15 DIAGNOSIS — D631 Anemia in chronic kidney disease: Secondary | ICD-10-CM | POA: Insufficient documentation

## 2017-06-15 LAB — IRON AND TIBC
IRON: 35 ug/dL — AB (ref 45–182)
Saturation Ratios: 11 % — ABNORMAL LOW (ref 17.9–39.5)
TIBC: 329 ug/dL (ref 250–450)
UIBC: 294 ug/dL

## 2017-06-15 LAB — FERRITIN: FERRITIN: 259 ng/mL (ref 24–336)

## 2017-06-15 LAB — POCT HEMOGLOBIN-HEMACUE: Hemoglobin: 9.2 g/dL — ABNORMAL LOW (ref 13.0–17.0)

## 2017-06-15 MED ORDER — EPOETIN ALFA 10000 UNIT/ML IJ SOLN
INTRAMUSCULAR | Status: AC
  Administered 2017-06-15: 5000 [IU] via SUBCUTANEOUS
  Filled 2017-06-15: qty 1

## 2017-06-15 MED ORDER — EPOETIN ALFA 10000 UNIT/ML IJ SOLN
5000.0000 [IU] | INTRAMUSCULAR | Status: DC
  Administered 2017-06-15: 5000 [IU] via SUBCUTANEOUS

## 2017-06-15 MED ORDER — SODIUM CHLORIDE 0.9 % IV SOLN
510.0000 mg | INTRAVENOUS | Status: AC
  Administered 2017-06-15: 10:00:00 510 mg via INTRAVENOUS
  Filled 2017-06-15: qty 17

## 2017-06-22 ENCOUNTER — Ambulatory Visit (HOSPITAL_COMMUNITY)
Admission: RE | Admit: 2017-06-22 | Discharge: 2017-06-22 | Disposition: A | Discharge status: Home / Self Care | Payer: Self-pay | Source: Ambulatory Visit | Attending: Nephrology | Admitting: Nephrology

## 2017-06-22 DIAGNOSIS — D631 Anemia in chronic kidney disease: Secondary | ICD-10-CM | POA: Insufficient documentation

## 2017-06-22 DIAGNOSIS — N184 Chronic kidney disease, stage 4 (severe): Secondary | ICD-10-CM | POA: Insufficient documentation

## 2017-06-22 LAB — POCT HEMOGLOBIN-HEMACUE: Hemoglobin: 9.3 g/dL — ABNORMAL LOW (ref 13.0–17.0)

## 2017-06-22 MED ORDER — EPOETIN ALFA 10000 UNIT/ML IJ SOLN
INTRAMUSCULAR | Status: AC
  Filled 2017-06-22: qty 1

## 2017-06-22 MED ORDER — EPOETIN ALFA 10000 UNIT/ML IJ SOLN
5000.0000 [IU] | INTRAMUSCULAR | Status: DC
  Administered 2017-06-22: 09:00:00 5000 [IU] via SUBCUTANEOUS

## 2017-06-29 ENCOUNTER — Encounter (HOSPITAL_COMMUNITY)
Admission: RE | Admit: 2017-06-29 | Discharge: 2017-06-29 | Disposition: A | Discharge status: Home / Self Care | Payer: Medicaid Other | Source: Ambulatory Visit | Attending: Nephrology | Admitting: Nephrology

## 2017-06-29 VITALS — BP 170/77 | HR 86 | Temp 98.0°F | Resp 18

## 2017-06-29 DIAGNOSIS — D631 Anemia in chronic kidney disease: Secondary | ICD-10-CM | POA: Insufficient documentation

## 2017-06-29 DIAGNOSIS — N184 Chronic kidney disease, stage 4 (severe): Secondary | ICD-10-CM | POA: Insufficient documentation

## 2017-06-29 MED ORDER — EPOETIN ALFA 10000 UNIT/ML IJ SOLN: 5000.0000 [IU] | INTRAMUSCULAR | Status: DC

## 2017-06-29 MED ORDER — EPOETIN ALFA 10000 UNIT/ML IJ SOLN
INTRAMUSCULAR | Status: AC
  Administered 2017-06-29: 14:00:00 10000 [IU]
  Filled 2017-06-29: qty 1

## 2017-06-30 LAB — POCT HEMOGLOBIN-HEMACUE: HEMOGLOBIN: 9.6 g/dL — AB (ref 13.0–17.0)

## 2017-07-01 ENCOUNTER — Ambulatory Visit (HOSPITAL_COMMUNITY): Payer: MEDICAID

## 2017-07-01 ENCOUNTER — Encounter: Payer: Medicaid Other | Admitting: Vascular Surgery

## 2017-07-01 ENCOUNTER — Ambulatory Visit (HOSPITAL_COMMUNITY)
Admission: RE | Admit: 2017-07-01 | Discharge: 2017-07-01 | Disposition: A | Discharge status: Home / Self Care | Payer: MEDICAID | Source: Ambulatory Visit | Attending: Vascular Surgery | Admitting: Vascular Surgery

## 2017-07-01 DIAGNOSIS — N186 End stage renal disease: Secondary | ICD-10-CM

## 2017-07-01 DIAGNOSIS — Z01810 Encounter for preprocedural cardiovascular examination: Secondary | ICD-10-CM

## 2017-07-06 ENCOUNTER — Ambulatory Visit (HOSPITAL_COMMUNITY)
Admission: RE | Admit: 2017-07-06 | Discharge: 2017-07-06 | Disposition: A | Discharge status: Home / Self Care | Payer: Self-pay | Source: Ambulatory Visit | Attending: Nephrology | Admitting: Nephrology

## 2017-07-06 VITALS — BP 178/93 | HR 96 | Temp 97.8°F | Resp 18

## 2017-07-06 DIAGNOSIS — N184 Chronic kidney disease, stage 4 (severe): Secondary | ICD-10-CM | POA: Insufficient documentation

## 2017-07-06 DIAGNOSIS — D631 Anemia in chronic kidney disease: Secondary | ICD-10-CM | POA: Insufficient documentation

## 2017-07-06 LAB — POCT HEMOGLOBIN-HEMACUE: Hemoglobin: 10.5 g/dL — ABNORMAL LOW (ref 13.0–17.0)

## 2017-07-06 MED ORDER — EPOETIN ALFA 10000 UNIT/ML IJ SOLN
5000.0000 [IU] | INTRAMUSCULAR | Status: DC
  Administered 2017-07-06: 5000 [IU] via SUBCUTANEOUS

## 2017-07-06 MED ORDER — EPOETIN ALFA 10000 UNIT/ML IJ SOLN
INTRAMUSCULAR | Status: AC
  Filled 2017-07-06: qty 1

## 2017-07-13 ENCOUNTER — Ambulatory Visit (HOSPITAL_COMMUNITY)
Admission: RE | Admit: 2017-07-13 | Discharge: 2017-07-13 | Disposition: A | Discharge status: Home / Self Care | Payer: Self-pay | Source: Ambulatory Visit | Attending: Nephrology | Admitting: Nephrology

## 2017-07-13 VITALS — BP 176/88 | HR 89 | Temp 97.5°F | Resp 18

## 2017-07-13 DIAGNOSIS — N184 Chronic kidney disease, stage 4 (severe): Secondary | ICD-10-CM | POA: Insufficient documentation

## 2017-07-13 DIAGNOSIS — D631 Anemia in chronic kidney disease: Secondary | ICD-10-CM | POA: Insufficient documentation

## 2017-07-13 LAB — FERRITIN: FERRITIN: 233 ng/mL (ref 24–336)

## 2017-07-13 LAB — IRON AND TIBC
Iron: 44 ug/dL — ABNORMAL LOW (ref 45–182)
Saturation Ratios: 14 % — ABNORMAL LOW (ref 17.9–39.5)
TIBC: 316 ug/dL (ref 250–450)
UIBC: 272 ug/dL

## 2017-07-13 LAB — POCT HEMOGLOBIN-HEMACUE: HEMOGLOBIN: 9.7 g/dL — AB (ref 13.0–17.0)

## 2017-07-13 MED ORDER — EPOETIN ALFA 10000 UNIT/ML IJ SOLN
INTRAMUSCULAR | Status: AC
  Filled 2017-07-13: qty 1

## 2017-07-13 MED ORDER — EPOETIN ALFA 10000 UNIT/ML IJ SOLN
5000.0000 [IU] | INTRAMUSCULAR | Status: DC
  Administered 2017-07-13: 5000 [IU] via SUBCUTANEOUS

## 2017-07-15 ENCOUNTER — Ambulatory Visit: Payer: Self-pay | Admitting: Internal Medicine

## 2017-07-15 ENCOUNTER — Encounter: Payer: Self-pay | Admitting: Internal Medicine

## 2017-07-15 VITALS — BP 150/90 | HR 70 | Resp 12 | Ht 63.0 in | Wt 141.0 lb

## 2017-07-15 DIAGNOSIS — E1165 Type 2 diabetes mellitus with hyperglycemia: Secondary | ICD-10-CM

## 2017-07-15 DIAGNOSIS — D631 Anemia in chronic kidney disease: Secondary | ICD-10-CM

## 2017-07-15 DIAGNOSIS — N189 Chronic kidney disease, unspecified: Secondary | ICD-10-CM

## 2017-07-15 DIAGNOSIS — E118 Type 2 diabetes mellitus with unspecified complications: Secondary | ICD-10-CM

## 2017-07-15 DIAGNOSIS — N184 Chronic kidney disease, stage 4 (severe): Secondary | ICD-10-CM

## 2017-07-15 DIAGNOSIS — E785 Hyperlipidemia, unspecified: Secondary | ICD-10-CM

## 2017-07-15 DIAGNOSIS — IMO0002 Reserved for concepts with insufficient information to code with codable children: Secondary | ICD-10-CM

## 2017-07-15 LAB — GLUCOSE, POCT (MANUAL RESULT ENTRY): POC Glucose: 220 mg/dl — AB (ref 70–99)

## 2017-07-15 MED ORDER — TERBINAFINE HCL 1 % EX CREA: 1.0000 "application " | TOPICAL_CREAM | Freq: Two times a day (BID) | CUTANEOUS | 0 refills | Status: DC

## 2017-07-15 MED ORDER — GLIPIZIDE 5 MG PO TABS: ORAL_TABLET | ORAL | 11 refills | Status: DC

## 2017-07-15 NOTE — Progress Notes (Signed)
Subjective:    Patient ID: Blake Santana, male    DOB: Feb 14, 1971, 46 y.o.   MRN: [id number]  HPI   1.  DM:  A1C on 07/13/2017 was 9.7%.  He is only taking Glipizide 5 mg twice daily.  He is not monitoring his sugars at all currently.   He would be willing to follow his sugars more closely. Did get Influenza vaccine gratis at [facility]. Recently saw his retinal specialist and was told his eye appears to be improving.  2.  ESRD:  Saw Dr. Justin Mend, Nephrology yesterday.  Apparent plan for vascular access and to start dialysis in January per patient.   For dyspnea and edema, Dr. Justin Mend increased his diuretics yesterdayt to Furosemide 80 mg tabs 3 tabs twice daily and Metolozone 2.5 mg once daily on M, W, F.    3.  Anemia of chronic renal disease: Gets procrit?  Weekly.  Last hemoglobin was 9.7 on the 11th of December.  4.  Hyperlipidemia:  Panel not quite at goal with LDL 96 in August.  Thought his Atorvastatin was increased to 40 mg at that time, but appears increased to 20 mg, which is what he is taking.     5.  Left fingernails and right great toenail with thickening and white discoloration.  Has had for some time.     Current Meds  Medication Sig  . amLODipine (NORVASC) 5 MG tablet Take 1 tablet (5 mg total) by mouth daily.  Marland Kitchen atorvastatin (LIPITOR) 20 MG tablet 1 tab by mouth daily with evening meal  . furosemide (LASIX) 80 MG tablet Take 80 mg by mouth. 2 tablets by mouth twice a day  . glipiZIDE (GLUCOTROL) 5 MG tablet 1 tab by mouth twice daily with meals  . metolazone (ZAROXOLYN) 2.5 MG tablet Take 2.5 mg by mouth. 1 tablet by mouth on Monday, Wednesday and friday  . metoprolol tartrate (LOPRESSOR) 25 MG tablet Take 1 tablet (25 mg total) by mouth 2 (two) times daily.   No Known Allergies Review of Systems     Objective:   Physical Exam  NAD, chronically ill appearing with sallow coloration.  HEENT: PERRL, EOMI, throat without injection, MMM Neck: Supple, No  adenopathy Chest:  CTA CV: RRR with normal S1 and S2, No S3, S4 or murmur.  Radial pulses normal and equal.  Left hand:  All fingernails with thickening and debris underneath nail, white discoloration.    Diabetic Foot Exam - Simple   Simple Foot Form Diabetic Foot exam was performed with the following findings:  Yes 07/15/2017  4:00 PM  Visual Inspection No deformities, no ulcerations, no other skin breakdown bilaterally:  Yes Sensation Testing See comments:  Yes Pulse Check See comments:  Yes Comments Mild edema of both feet, pale skin without breakdown.  Decreased DP and PT pulses bilaterally.  No sensation to 10 g monofilament        Assessment & Plan:  1.  DM:  Misread labs from 12.11.18:  A1C was not 9.7%, this was his hemoglobin.  Last A1C actually in September and at goal with result of 6.7% To keep Glipizide at 5 mg twice daily and not increase morning dose to 10 mg.  A1C with fasting labs I 1 week. Now patient with nephrotic syndrome, should be immunized with PCV 13 and another Pneumococcal 23 V. Will look into cost of PCV 13 or may obtain at [facility].  2.  CKD and associated anemia:  As per Dr. Justin Mend.  3.  Dyslipidemia:  LDL has not been quite at goal.  FLP in 1 week.  4.  Fingernail onychomycosis:  Looks more like yeast.  To try topical Terbinafine to keep this controlled rather than adding another oral medication for now.  Follow up in 2 months.

## 2017-07-20 ENCOUNTER — Encounter (HOSPITAL_COMMUNITY)
Admission: RE | Admit: 2017-07-20 | Discharge: 2017-07-20 | Disposition: A | Discharge status: Home / Self Care | Payer: Self-pay | Source: Ambulatory Visit | Attending: Nephrology | Admitting: Nephrology

## 2017-07-20 VITALS — BP 155/73 | HR 80 | Temp 97.6°F | Resp 18

## 2017-07-20 DIAGNOSIS — D631 Anemia in chronic kidney disease: Secondary | ICD-10-CM | POA: Insufficient documentation

## 2017-07-20 DIAGNOSIS — N184 Chronic kidney disease, stage 4 (severe): Secondary | ICD-10-CM | POA: Insufficient documentation

## 2017-07-20 LAB — POCT HEMOGLOBIN-HEMACUE: HEMOGLOBIN: 9.8 g/dL — AB (ref 13.0–17.0)

## 2017-07-20 MED ORDER — EPOETIN ALFA 10000 UNIT/ML IJ SOLN
INTRAMUSCULAR | Status: AC
  Administered 2017-07-20: 14:00:00 5000 [IU]
  Filled 2017-07-20: qty 1

## 2017-07-20 MED ORDER — EPOETIN ALFA 10000 UNIT/ML IJ SOLN: 5000.0000 [IU] | INTRAMUSCULAR | Status: DC

## 2017-07-22 ENCOUNTER — Other Ambulatory Visit: Payer: Self-pay

## 2017-07-22 DIAGNOSIS — E1165 Type 2 diabetes mellitus with hyperglycemia: Secondary | ICD-10-CM

## 2017-07-22 DIAGNOSIS — IMO0002 Reserved for concepts with insufficient information to code with codable children: Secondary | ICD-10-CM

## 2017-07-22 DIAGNOSIS — E118 Type 2 diabetes mellitus with unspecified complications: Secondary | ICD-10-CM

## 2017-07-22 DIAGNOSIS — E785 Hyperlipidemia, unspecified: Secondary | ICD-10-CM

## 2017-07-23 ENCOUNTER — Other Ambulatory Visit: Payer: Self-pay | Admitting: Internal Medicine

## 2017-07-23 DIAGNOSIS — E785 Hyperlipidemia, unspecified: Secondary | ICD-10-CM

## 2017-07-23 LAB — HGB A1C W/O EAG: HEMOGLOBIN A1C: 6.9 % — AB (ref 4.8–5.6)

## 2017-07-23 LAB — LIPID PANEL W/O CHOL/HDL RATIO
CHOLESTEROL TOTAL: 142 mg/dL (ref 100–199)
HDL: 48 mg/dL (ref >39)
LDL Calculated: 89 mg/dL (ref 0–99)
TRIGLYCERIDES: 24 mg/dL (ref 0–149)
VLDL Cholesterol Cal: 5 mg/dL (ref 5–40)

## 2017-07-23 MED ORDER — ATORVASTATIN CALCIUM 40 MG PO TABS
ORAL_TABLET | ORAL | 11 refills | Status: DC
Start: 2017-07-23 — End: 2017-10-22

## 2017-07-26 ENCOUNTER — Encounter (HOSPITAL_COMMUNITY)
Admission: RE | Admit: 2017-07-26 | Discharge: 2017-07-26 | Disposition: A | Discharge status: Home / Self Care | Payer: Self-pay | Source: Ambulatory Visit | Attending: Nephrology | Admitting: Nephrology

## 2017-07-26 VITALS — BP 166/79 | HR 85 | Temp 98.8°F | Resp 18

## 2017-07-26 DIAGNOSIS — D631 Anemia in chronic kidney disease: Secondary | ICD-10-CM

## 2017-07-26 DIAGNOSIS — N184 Chronic kidney disease, stage 4 (severe): Principal | ICD-10-CM

## 2017-07-26 LAB — POCT HEMOGLOBIN-HEMACUE: HEMOGLOBIN: 10 g/dL — AB (ref 13.0–17.0)

## 2017-07-26 MED ORDER — EPOETIN ALFA 10000 UNIT/ML IJ SOLN
5000.0000 [IU] | INTRAMUSCULAR | Status: DC
  Administered 2017-07-26: 5000 [IU] via SUBCUTANEOUS

## 2017-07-26 MED ORDER — EPOETIN ALFA 10000 UNIT/ML IJ SOLN
INTRAMUSCULAR | Status: AC
  Administered 2017-07-26: 5000 [IU] via SUBCUTANEOUS
  Filled 2017-07-26: qty 1

## 2017-08-02 ENCOUNTER — Encounter (HOSPITAL_COMMUNITY): Payer: Self-pay

## 2017-08-03 DIAGNOSIS — H35033 Hypertensive retinopathy, bilateral: Secondary | ICD-10-CM

## 2017-08-03 DIAGNOSIS — H269 Unspecified cataract: Secondary | ICD-10-CM | POA: Insufficient documentation

## 2017-08-03 DIAGNOSIS — B338 Other specified viral diseases: Secondary | ICD-10-CM

## 2017-08-03 DIAGNOSIS — E113519 Type 2 diabetes mellitus with proliferative diabetic retinopathy with macular edema, unspecified eye: Secondary | ICD-10-CM | POA: Insufficient documentation

## 2017-08-03 DIAGNOSIS — B974 Respiratory syncytial virus as the cause of diseases classified elsewhere: Secondary | ICD-10-CM

## 2017-08-03 HISTORY — DX: Type 2 diabetes mellitus with proliferative diabetic retinopathy with macular edema, unspecified eye: E11.3519

## 2017-08-03 HISTORY — DX: Hypertensive retinopathy, bilateral: H35.033

## 2017-08-03 HISTORY — DX: Unspecified cataract: H26.9

## 2017-08-03 HISTORY — DX: Other specified viral diseases: B33.8

## 2017-08-03 HISTORY — DX: Respiratory syncytial virus as the cause of diseases classified elsewhere: B97.4

## 2017-08-04 ENCOUNTER — Encounter (HOSPITAL_COMMUNITY)
Admission: RE | Admit: 2017-08-04 | Discharge: 2017-08-04 | Disposition: A | Discharge status: Home / Self Care | Payer: Self-pay | Source: Ambulatory Visit | Attending: Nephrology | Admitting: Nephrology

## 2017-08-04 VITALS — BP 171/84 | HR 91 | Temp 98.4°F | Resp 18

## 2017-08-04 DIAGNOSIS — D631 Anemia in chronic kidney disease: Secondary | ICD-10-CM | POA: Insufficient documentation

## 2017-08-04 DIAGNOSIS — N184 Chronic kidney disease, stage 4 (severe): Secondary | ICD-10-CM | POA: Insufficient documentation

## 2017-08-04 LAB — POCT HEMOGLOBIN-HEMACUE: HEMOGLOBIN: 10.1 g/dL — AB (ref 13.0–17.0)

## 2017-08-04 MED ORDER — EPOETIN ALFA 10000 UNIT/ML IJ SOLN
5000.0000 [IU] | INTRAMUSCULAR | Status: DC
  Administered 2017-08-04: 5000 [IU] via SUBCUTANEOUS

## 2017-08-04 MED ORDER — EPOETIN ALFA 10000 UNIT/ML IJ SOLN
INTRAMUSCULAR | Status: AC
  Filled 2017-08-04: qty 1

## 2017-08-08 ENCOUNTER — Encounter (HOSPITAL_COMMUNITY): Payer: Self-pay

## 2017-08-08 ENCOUNTER — Emergency Department (HOSPITAL_COMMUNITY): Payer: Medicaid Other

## 2017-08-08 ENCOUNTER — Inpatient Hospital Stay (HOSPITAL_COMMUNITY)
Admission: EM | Admit: 2017-08-08 | Discharge: 2017-08-12 | DRG: 264 | Disposition: A | Discharge status: Home / Self Care | Payer: Medicaid Other | Attending: Oncology | Admitting: Oncology

## 2017-08-08 DIAGNOSIS — B338 Other specified viral diseases: Secondary | ICD-10-CM

## 2017-08-08 DIAGNOSIS — Z833 Family history of diabetes mellitus: Secondary | ICD-10-CM | POA: Diagnosis not present

## 2017-08-08 DIAGNOSIS — I878 Other specified disorders of veins: Secondary | ICD-10-CM

## 2017-08-08 DIAGNOSIS — E46 Unspecified protein-calorie malnutrition: Secondary | ICD-10-CM

## 2017-08-08 DIAGNOSIS — M898X9 Other specified disorders of bone, unspecified site: Secondary | ICD-10-CM | POA: Diagnosis present

## 2017-08-08 DIAGNOSIS — B974 Respiratory syncytial virus as the cause of diseases classified elsewhere: Secondary | ICD-10-CM

## 2017-08-08 DIAGNOSIS — R7989 Other specified abnormal findings of blood chemistry: Secondary | ICD-10-CM

## 2017-08-08 DIAGNOSIS — Z9889 Other specified postprocedural states: Secondary | ICD-10-CM

## 2017-08-08 DIAGNOSIS — E1129 Type 2 diabetes mellitus with other diabetic kidney complication: Secondary | ICD-10-CM | POA: Diagnosis present

## 2017-08-08 DIAGNOSIS — R6889 Other general symptoms and signs: Secondary | ICD-10-CM

## 2017-08-08 DIAGNOSIS — Z6820 Body mass index (BMI) 20.0-20.9, adult: Secondary | ICD-10-CM | POA: Diagnosis not present

## 2017-08-08 DIAGNOSIS — Z992 Dependence on renal dialysis: Secondary | ICD-10-CM | POA: Diagnosis not present

## 2017-08-08 DIAGNOSIS — E1165 Type 2 diabetes mellitus with hyperglycemia: Secondary | ICD-10-CM | POA: Diagnosis present

## 2017-08-08 DIAGNOSIS — I152 Hypertension secondary to endocrine disorders: Secondary | ICD-10-CM | POA: Diagnosis present

## 2017-08-08 DIAGNOSIS — I132 Hypertensive heart and chronic kidney disease with heart failure and with stage 5 chronic kidney disease, or end stage renal disease: Secondary | ICD-10-CM | POA: Diagnosis present

## 2017-08-08 DIAGNOSIS — E118 Type 2 diabetes mellitus with unspecified complications: Secondary | ICD-10-CM | POA: Diagnosis present

## 2017-08-08 DIAGNOSIS — E1122 Type 2 diabetes mellitus with diabetic chronic kidney disease: Secondary | ICD-10-CM | POA: Diagnosis present

## 2017-08-08 DIAGNOSIS — Z7984 Long term (current) use of oral hypoglycemic drugs: Secondary | ICD-10-CM | POA: Diagnosis not present

## 2017-08-08 DIAGNOSIS — I10 Essential (primary) hypertension: Secondary | ICD-10-CM | POA: Diagnosis present

## 2017-08-08 DIAGNOSIS — Z419 Encounter for procedure for purposes other than remedying health state, unspecified: Secondary | ICD-10-CM

## 2017-08-08 DIAGNOSIS — Z79899 Other long term (current) drug therapy: Secondary | ICD-10-CM

## 2017-08-08 DIAGNOSIS — Z87891 Personal history of nicotine dependence: Secondary | ICD-10-CM

## 2017-08-08 DIAGNOSIS — D631 Anemia in chronic kidney disease: Secondary | ICD-10-CM | POA: Diagnosis present

## 2017-08-08 DIAGNOSIS — E440 Moderate protein-calorie malnutrition: Secondary | ICD-10-CM

## 2017-08-08 DIAGNOSIS — N189 Chronic kidney disease, unspecified: Secondary | ICD-10-CM

## 2017-08-08 DIAGNOSIS — E1121 Type 2 diabetes mellitus with diabetic nephropathy: Secondary | ICD-10-CM | POA: Diagnosis present

## 2017-08-08 DIAGNOSIS — I5031 Acute diastolic (congestive) heart failure: Secondary | ICD-10-CM | POA: Diagnosis present

## 2017-08-08 DIAGNOSIS — J40 Bronchitis, not specified as acute or chronic: Secondary | ICD-10-CM | POA: Diagnosis present

## 2017-08-08 DIAGNOSIS — N186 End stage renal disease: Secondary | ICD-10-CM | POA: Diagnosis present

## 2017-08-08 DIAGNOSIS — Z8249 Family history of ischemic heart disease and other diseases of the circulatory system: Secondary | ICD-10-CM

## 2017-08-08 DIAGNOSIS — IMO0002 Reserved for concepts with insufficient information to code with codable children: Secondary | ICD-10-CM

## 2017-08-08 DIAGNOSIS — J121 Respiratory syncytial virus pneumonia: Secondary | ICD-10-CM | POA: Diagnosis present

## 2017-08-08 DIAGNOSIS — J810 Acute pulmonary edema: Secondary | ICD-10-CM

## 2017-08-08 DIAGNOSIS — J208 Acute bronchitis due to other specified organisms: Secondary | ICD-10-CM

## 2017-08-08 DIAGNOSIS — R609 Edema, unspecified: Secondary | ICD-10-CM | POA: Diagnosis present

## 2017-08-08 HISTORY — DX: Respiratory syncytial virus as the cause of diseases classified elsewhere: B97.4

## 2017-08-08 LAB — URINALYSIS, ROUTINE W REFLEX MICROSCOPIC
Bilirubin Urine: NEGATIVE
GLUCOSE, UA: 150 mg/dL — AB
KETONES UR: NEGATIVE mg/dL
Leukocytes, UA: NEGATIVE
Nitrite: NEGATIVE
Specific Gravity, Urine: 1.015 (ref 1.005–1.030)
pH: 5 (ref 5.0–8.0)

## 2017-08-08 LAB — BASIC METABOLIC PANEL
Anion gap: 14 (ref 5–15)
BUN: 115 mg/dL — ABNORMAL HIGH (ref 6–20)
CO2: 18 mmol/L — ABNORMAL LOW (ref 22–32)
Calcium: 8.4 mg/dL — ABNORMAL LOW (ref 8.9–10.3)
Chloride: 97 mmol/L — ABNORMAL LOW (ref 101–111)
Creatinine, Ser: 9.01 mg/dL — ABNORMAL HIGH (ref 0.61–1.24)
GFR calc Af Amer: 7 mL/min — ABNORMAL LOW (ref >60)
GFR, EST NON AFRICAN AMERICAN: 6 mL/min — AB (ref >60)
GLUCOSE: 139 mg/dL — AB (ref 65–99)
Potassium: 4.2 mmol/L (ref 3.5–5.1)
Sodium: 129 mmol/L — ABNORMAL LOW (ref 135–145)

## 2017-08-08 LAB — RESPIRATORY PANEL BY PCR
ADENOVIRUS-RVPPCR: NOT DETECTED
Bordetella pertussis: NOT DETECTED
CHLAMYDOPHILA PNEUMONIAE-RVPPCR: NOT DETECTED
CORONAVIRUS HKU1-RVPPCR: NOT DETECTED
Coronavirus 229E: NOT DETECTED
Coronavirus NL63: NOT DETECTED
Coronavirus OC43: NOT DETECTED
Influenza A: NOT DETECTED
Influenza B: NOT DETECTED
MYCOPLASMA PNEUMONIAE-RVPPCR: NOT DETECTED
Metapneumovirus: NOT DETECTED
Parainfluenza Virus 1: NOT DETECTED
Parainfluenza Virus 2: NOT DETECTED
Parainfluenza Virus 3: NOT DETECTED
Parainfluenza Virus 4: NOT DETECTED
Respiratory Syncytial Virus: DETECTED — AB
Rhinovirus / Enterovirus: NOT DETECTED

## 2017-08-08 LAB — TROPONIN I
TROPONIN I: 0.05 ng/mL — AB (ref <0.03)
TROPONIN I: 0.04 ng/mL — AB (ref <0.03)
Troponin I: 0.08 ng/mL (ref <0.03)

## 2017-08-08 LAB — INFLUENZA PANEL BY PCR (TYPE A & B)
INFLAPCR: NEGATIVE
INFLBPCR: NEGATIVE

## 2017-08-08 LAB — CBC
HEMATOCRIT: 29.4 % — AB (ref 39.0–52.0)
Hemoglobin: 9.7 g/dL — ABNORMAL LOW (ref 13.0–17.0)
MCH: 27.7 pg (ref 26.0–34.0)
MCHC: 33 g/dL (ref 30.0–36.0)
MCV: 84 fL (ref 78.0–100.0)
Platelets: 191 10*3/uL (ref 150–400)
RBC: 3.5 MIL/uL — ABNORMAL LOW (ref 4.22–5.81)
RDW: 16.8 % — ABNORMAL HIGH (ref 11.5–15.5)
WBC: 8.1 10*3/uL (ref 4.0–10.5)

## 2017-08-08 LAB — FERRITIN: FERRITIN: 219 ng/mL (ref 24–336)

## 2017-08-08 LAB — IRON AND TIBC
Iron: 20 ug/dL — ABNORMAL LOW (ref 45–182)
SATURATION RATIOS: 9 % — AB (ref 17.9–39.5)
TIBC: 214 ug/dL — AB (ref 250–450)
UIBC: 194 ug/dL

## 2017-08-08 LAB — PHOSPHORUS: Phosphorus: 6.8 mg/dL — ABNORMAL HIGH (ref 2.5–4.6)

## 2017-08-08 LAB — CBG MONITORING, ED: Glucose-Capillary: 224 mg/dL — ABNORMAL HIGH (ref 65–99)

## 2017-08-08 LAB — LIPASE, BLOOD: Lipase: 81 U/L — ABNORMAL HIGH (ref 11–51)

## 2017-08-08 MED ORDER — INSULIN ASPART 100 UNIT/ML ~~LOC~~ SOLN
0.0000 [IU] | Freq: Three times a day (TID) | SUBCUTANEOUS | Status: DC
  Administered 2017-08-09: 1 [IU] via SUBCUTANEOUS
  Administered 2017-08-10 – 2017-08-11 (×2): 2 [IU] via SUBCUTANEOUS
  Filled 2017-08-08: qty 1

## 2017-08-08 MED ORDER — ONDANSETRON HCL 4 MG/2ML IJ SOLN
4.0000 mg | Freq: Once | INTRAMUSCULAR | Status: DC
  Filled 2017-08-08: qty 2

## 2017-08-08 MED ORDER — HEPARIN SODIUM (PORCINE) 5000 UNIT/ML IJ SOLN
5000.0000 [IU] | Freq: Three times a day (TID) | INTRAMUSCULAR | Status: DC
  Administered 2017-08-08: 5000 [IU] via SUBCUTANEOUS
  Filled 2017-08-08: qty 1

## 2017-08-08 MED ORDER — ATORVASTATIN CALCIUM 40 MG PO TABS
40.0000 mg | ORAL_TABLET | Freq: Every day | ORAL | Status: DC
  Administered 2017-08-08 – 2017-08-12 (×5): 40 mg via ORAL
  Filled 2017-08-08 (×5): qty 1

## 2017-08-08 MED ORDER — SEVELAMER CARBONATE 800 MG PO TABS
1600.0000 mg | ORAL_TABLET | Freq: Three times a day (TID) | ORAL | Status: DC
  Administered 2017-08-09 – 2017-08-12 (×10): 1600 mg via ORAL
  Filled 2017-08-08 (×13): qty 2

## 2017-08-08 MED ORDER — RENA-VITE PO TABS
1.0000 | ORAL_TABLET | Freq: Every day | ORAL | Status: DC
  Administered 2017-08-08 – 2017-08-11 (×4): 1 via ORAL
  Filled 2017-08-08 (×5): qty 1

## 2017-08-08 MED ORDER — AMLODIPINE BESYLATE 5 MG PO TABS
5.0000 mg | ORAL_TABLET | Freq: Every day | ORAL | Status: DC
  Administered 2017-08-08 – 2017-08-12 (×5): 5 mg via ORAL
  Filled 2017-08-08 (×6): qty 1

## 2017-08-08 NOTE — Consult Note (Signed)
[facility] Renal Consultation Note    Indication for Consultation:  Management of ESRD/hemodialysis; anemia, hypertension/volume and secondary hyperparathyroidism  HPI: Blake Santana is a 47 y.o. male with ESRD with long-standing DM, HTN, proteinuria and edema, mild diastolic CHF with EF 81-44%. Renal biopsy in 04/2017 showing advanced diabetic glomerulosclerosis. He is followed in office by Dr. Justin Mend.  CKD has progressed to ESRD. He was most recently seen in office this week on 1/2 and CLIP started. Labs at that visit showing Cr/BUN 8.9/117. He was scheduled for outpatient access placement this week. He has been on Lasix 37m bid and getting weekly Procrit injections.   He is now admitted with volume overload and likely uremic symptoms. Labs showing Na 129, K 4.2, Glucose 139 Cr 9.01, BUN 115 Ca 8.4, Hgb 9.7. CXR showing vascular congestion/pulm edema. Influenza PCR negative.   Seen in ED with video interpreter. He presented to ED this am with worsening SOB, he had chills and vomiting starting this morning.  He has been feeling unwell for the past week with cough, dyspnea and poor appetite. He hasn't taken his Lasix since Thursday. He understands that dialysis is needed now, he wasn't sure exactly when he was supposed to start, but is agreeable to proceed.    Past Medical History:  Diagnosis Date  . Anemia of chronic kidney failure 03/11/2017  . Chronic kidney disease 03/11/2017  . [place] [address] . Edema 01/14/2017  . Hypertension 03/11/2017  . Iron deficiency anemia 06/18/2016   History reviewed. No pertinent surgical history. Family History  Problem Relation Age of Onset  . Diabetes Mother   . Kidney disease Mother        renal failure--diabetes, cause of death  . Hypertension Mother   . Diabetes Father   . Kidney disease Father        Cause of death:  kidney failure form diabetes  . Alcohol abuse Father   . Diabetes Sister     Social History:  reports that he has quit smoking. he has never used smokeless tobacco. He reports that he does not drink alcohol or use drugs. No Known Allergies Prior to Admission medications   Medication Sig Start Date End Date Taking? Authorizing Provider  amLODipine (NORVASC) 5 MG tablet Take 1 tablet (5 mg total) by mouth daily. 03/26/17  Yes MMack Hook MD  atorvastatin (LIPITOR) 40 MG tablet 1 tab by mouth daily with evening meal Patient taking differently: Take 40 mg by mouth daily.  07/23/17  Yes MMack Hook MD  furosemide (LASIX) 80 MG tablet Take 80 mg by mouth. 2 tablets by mouth twice a day   Yes [provider]  glipiZIDE (GLUCOTROL) 5 MG tablet 2 tabs by mouth in the morning with breakfast and 1 tab by mouth with evening meal. Patient taking differently: Take 5 mg by mouth 2 (two) times daily before a meal.  07/15/17  Yes MMack Hook MD  metolazone (ZAROXOLYN) 2.5 MG tablet Take 2.5 mg by mouth. 1 tablet by mouth on Monday, Wednesday and friday   Yes [provider]  metoprolol tartrate (LOPRESSOR) 25 MG tablet Take 1 tablet (25 mg total) by mouth 2 (two) times daily. 03/11/17  Yes MMack Hook MD  Blood Glucose Monitoring Suppl ([facility]) w/Device KIT Check sugars twice daily 06/04/16   MMack Hook MD  glucose blood (AGAMATRIX PRESTO TEST) test strip Check sugars twice daily 06/04/16   MMack Hook MD   Current Facility-Administered  Medications  Medication Dose Route Frequency Provider Last Rate Last Dose  . amLODipine (NORVASC) tablet 5 mg  5 mg Oral Daily Ledell Noss, MD   5 mg at 08/08/17 1021  . atorvastatin (LIPITOR) tablet 40 mg  40 mg Oral Daily Ledell Noss, MD   40 mg at 08/08/17 1021  . heparin injection 5,000 Units  5,000 Units Subcutaneous Q8H Ledell Noss, MD   5,000 Units at 08/08/17 1022  . ondansetron (ZOFRAN) injection 4 mg  4 mg Intravenous Once Jeannett Senior, PA-C       Current  Outpatient Medications  Medication Sig Dispense Refill  . amLODipine (NORVASC) 5 MG tablet Take 1 tablet (5 mg total) by mouth daily. 30 tablet 11  . atorvastatin (LIPITOR) 40 MG tablet 1 tab by mouth daily with evening meal (Patient taking differently: Take 40 mg by mouth daily. ) 30 tablet 11  . furosemide (LASIX) 80 MG tablet Take 80 mg by mouth. 2 tablets by mouth twice a day    . glipiZIDE (GLUCOTROL) 5 MG tablet 2 tabs by mouth in the morning with breakfast and 1 tab by mouth with evening meal. (Patient taking differently: Take 5 mg by mouth 2 (two) times daily before a meal. ) 60 tablet 11  . metolazone (ZAROXOLYN) 2.5 MG tablet Take 2.5 mg by mouth. 1 tablet by mouth on Monday, Wednesday and friday    . metoprolol tartrate (LOPRESSOR) 25 MG tablet Take 1 tablet (25 mg total) by mouth 2 (two) times daily. 60 tablet 11  . Blood Glucose Monitoring Suppl (AGAMATRIX PRESTO) w/Device KIT Check sugars twice daily 1 kit 0  . glucose blood (AGAMATRIX PRESTO TEST) test strip Check sugars twice daily 100 each 12    ROS: As per HPI otherwise negative.  Physical Exam: Vitals:   08/08/17 0930 08/08/17 1000 08/08/17 1130 08/08/17 1200  BP: (!) 169/91 (!) 157/77 (!) 159/90 (!) 160/107  Pulse: 74 71 81 79  Resp: (!) 21 (!) 25 (!) 25 20  Temp:      TempSrc:      SpO2: 90% 91% 97% 94%     General: WDWN NAD Head: NCAT sclera not icteric MMM Neck: Supple. JVD elevated  Lungs: Coarse breath sound, rales at bases Heart: RRR with S1 S2 Abdomen: soft NT + BS Lower extremities: 3+ pitting LE edema to shins bilat  Neuro: A & O  X 3. Moves all extremities spontaneously. Psych:  Responds to questions appropriately with a normal affect.  Labs: Basic Metabolic Panel: Recent Labs  Lab 08/08/17 0350 08/08/17 1020  NA 129*  --   K 4.2  --   CL 97*  --   CO2 18*  --   GLUCOSE 139*  --   BUN 115*  --   CREATININE 9.01*  --   CALCIUM 8.4*  --   PHOS  --  6.8*   Liver Function Tests: No results  for input(s): AST, ALT, ALKPHOS, BILITOT, PROT, ALBUMIN in the last 168 hours. Recent Labs  Lab 08/08/17 0350  LIPASE 81*   No results for input(s): AMMONIA in the last 168 hours. CBC: Recent Labs  Lab 08/04/17 0835 08/08/17 0350  WBC  --  8.1  HGB 10.1* 9.7*  HCT  --  29.4*  MCV  --  84.0  PLT  --  191   Cardiac Enzymes: Recent Labs  Lab 08/08/17 0350 08/08/17 1020  TROPONINI 0.05* 0.08*   CBG: No results for input(s): GLUCAP in the  last 168 hours. Iron Studies: No results for input(s): IRON, TIBC, TRANSFERRIN, FERRITIN in the last 72 hours. Studies/Results: Dg Chest 2 View  Result Date: 08/08/2017 CLINICAL DATA:  Central chest pain. EXAM: CHEST  2 VIEW COMPARISON:  Radiograph 04/02/2017 FINDINGS: Unchanged cardiomegaly and mediastinal contours. Vascular congestion with peribronchial cuffing suspicious for pulmonary edema. Minimal fluid in the fissures without subpulmonic effusion. No confluent airspace disease. No pneumothorax. No acute osseous abnormality. IMPRESSION: Cardiomegaly with vascular congestion. Peribronchial cuffing suspicious for pulmonary edema. Increased fluid in the fissures. Findings suspicious for fluid overload/CHF. Electronically Signed   By: Jeb Levering M.D.   On: 08/08/2017 04:26    Assessment/Plan: 1. ESRD 2/2 diabetic nephropathy. Seen in office last week with CLIP started.  Now admitted with volume overload and uremia. Will start HD during admit. Have consulted [facility] for access placement, will need catheter placed as well as permanent access. Hopefully can start HD tomorrow after catheter placed.  Orders written for HD 2.5h 2K/2Ca BFR 250/500 No Heparin  2.  Hypertension/volume - BP elevated with volume. Should improve with HD, follow. Current meds amlodipine, furosemide   3.  Anemia  - Hgb 9.7. Procrit dosed 1/2. Tsat 14% Ferritin 233 on 07/13/17 , recheck and replete Fe as needed  4.  Metabolic bone disease -  Phos elevated. Add Renvela binder.  Check iPTH  5.  Nutrition - Renal/carb mod diet/ Add Renal vitamin  6. DM - per primary   Lynnda Child PA-C [place] Pager [phone] 08/08/2017, 1:03 PM

## 2017-08-08 NOTE — ED Triage Notes (Signed)
Pt states that for the past four days has been having central CP that radiates to back along with nausea, no SOB, also c/o of abd pain.

## 2017-08-08 NOTE — H&P (Signed)
Date: 08/08/2017               Patient Name:  Blake Santana MRN: [id number]  DOB: September 11, 1970 Age / Sex: 47 y.o., male   PCP: Edrick Oh, MD         Medical Service: Internal Medicine Teaching Service         Attending Physician: Dr. Annia Belt, MD    First Contact: Dr. Frederico Hamman  Pager: [phone]  Second Contact: Dr. Hetty Ely  Pager: [phone]       After Hours (After 5p/  First Contact Pager: [phone]  weekends / holidays): Second Contact Pager: [phone]   Chief Complaint: Cough, shortness of breath, malaise   History of Present Illness:  Blake Santana is a 47 yo male with history of ESRD not on HD, T2DM, and HTN who presents to the ED with sore throat, cough, and shortness of breath.  Patient is Spanish-speaking only, no interpreter used during this encounter.  Patient states he cannot remember the last time he was feeling like his usual self.  He has been feeling fatigued and more tired than usual for several days now and  3 days ago he started to experience a dry cough, sore throat, sinus pain, chest pain, upper back pain, abdominal pain, nausea and vomiting.  Denies fever and myalgias, but endorses chills during this time.  He has been taking Mucinex and Tylenol without improvement in symptoms.  Denies sick contacts. Received flu shot 06/2017.  Also reports orthopnea and PND. He is dyspneic at baseline, but shortness of breath has worsened in the past 3 days. It is worse when he lays flat in bed and has been sleeping sitting up in a chair. Denies known underlying lung disease. TTE from 04/2017 showed EF 50%, G2DD, mild MR, PAPP 35, and small pericardial effusion.   Regarding his ESRD, he follows up with Dr. Justin Mend.  States he was last seen by his office 3 days ago and was told he needed to start hemodialysis this month. Per PCP note 12/13 plan for vascular access and HD 08/2017. He makes urine.   ED course: On arrival to the ED, afebrile  HR 80  BP 155/73  100% RA.  Oxygenating well on room air. Flu negative. Labwork remarkable for Hgb 9.7, Na 129, bicarb 18, BUN 115, Cr 9. No leukocytosis. UA with hemoglobinuria and proteinuria, rare bacteria, no nitrites or leukocytes. CXR consistent with pulmonary edema.    Review of Systems: A complete ROS was negative except as per HPI.   Meds:  Current Meds  Medication Sig  . amLODipine (NORVASC) 5 MG tablet Take 1 tablet (5 mg total) by mouth daily.  Marland Kitchen atorvastatin (LIPITOR) 40 MG tablet 1 tab by mouth daily with evening meal (Patient taking differently: Take 40 mg by mouth daily. )  . furosemide (LASIX) 80 MG tablet Take 80 mg by mouth. 2 tablets by mouth twice a day  . glipiZIDE (GLUCOTROL) 5 MG tablet 2 tabs by mouth in the morning with breakfast and 1 tab by mouth with evening meal. (Patient taking differently: Take 5 mg by mouth 2 (two) times daily before a meal. )  . metolazone (ZAROXOLYN) 2.5 MG tablet Take 2.5 mg by mouth. 1 tablet by mouth on Monday, Wednesday and friday  . metoprolol tartrate (LOPRESSOR) 25 MG tablet Take 1 tablet (25 mg total) by mouth 2 (two) times daily.  . [DISCONTINUED] ferrous gluconate (FERGON) 225 (27 Fe) MG tablet Take  240 mg by mouth 2 (two) times daily.     Allergies: Allergies as of 08/08/2017  . (No Known Allergies)   Past Medical History:  Diagnosis Date  . Anemia of chronic kidney failure 03/11/2017  . Chronic kidney disease 03/11/2017  . Diabetes mellitus without complication ([place]) [address]  . Edema 01/14/2017  . Hypertension 03/11/2017  . Iron deficiency anemia 06/18/2016    Family History:  DM - mother and father HTN - mother and father Brother with heart disease   Social History:  Patient is from [place], [place]. Lives home with wife and 1 son. Unemployed. Quit smoking and drinking 3-4 years ago. Denies drug use.   Physical Exam: Blood pressure (!) 157/77, pulse 71, temperature 98 F (36.7 C), temperature source Oral, resp. rate (!) 25, SpO2 91 %.  General:  very pleasant male, thin, well-developed, lying in bed in no acute distress  HENT: NCAT, no facial pain, neck supple and FROM, no lymphadenopathy, OP erythematous without exudates, poor dentition  Eyes: anicteric sclera, PERRL Cardiac: regular rate and rhythm, nl S1/S2, no murmurs, rubs or gallops, JVD 3-4 cm above clavicle  Pulm: crackles throughout with mild expiratory wheezes at the bases, no increased work of breathing while on room air  Abd: soft, NTND, normoactive bowel sounds Neuro: A&Ox3, no focal deficits noted  Ext: warm and well perfused, 1+ bilateral LE pitting edema extending up to ankles   EKG: personally reviewed my interpretation is NSR at 91 bpm, deep S wave in lead I and Q wave in lead III, no acute signs of ischemia   CXR: personally reviewed my interpretation is interstitial edema throughout, cardiomegaly, no opacities or pleural effusions noted   Renal biopsy 04/07/17: ADVANCED DIABETIC GLOMERULOSCLEROSIS IN ASSOCIATION WITH SEVERE ARTERIONEPHROSCLEROSIS, DIFFUSE SEVERE TUBULOINTERSTITIAL SCARRING, AND SECONDARY FOCAL AND SEGMENTAL GLOMERULAR TUFTS SCARRING. ACCELERATED FOCALLY STENOSING HYPERTENSIVE CHANGES IN SMALL CALIBER VESSELS. NO EVIDENCE OF AN IMMUNE COMPLEX MEDIATED OR ACTIVE GLOMERULONEPHRITIS.  Assessment & Plan by Problem:  Blake Santana is a 47 yo male with history of ESRD not on HD, T2DM, and HTN who presents to the ED with flu like symptoms. His exam is remarkable for OP erythema and signs of volume overload (JVD, crackles, and LE edema). Labwork consistent with ESRD.   # Flu-like symptoms: Patient complaining of sore throat, sinus pain, dry cough, chest pain, abdominal pain, N and V x 3 days. He is afebrile and HDS. OP erythematous and volume overloaded on exam.  Flu test negative. Denies sick contacts. Ex-smoker, reports no underlying lung disease. Dyspneic at baseline. Reports orthopnea and PND. CXR consistent with pulmonary edema. No new oxygen  requirements at this time. Suspect upper viral respiratory infection exacerbating SOB in the setting of worsening renal function. No signs of systemic infection at this time. Will continue supportive care at this time.  - Continue to monitor respiratory status  - F/u RVP   - Continue droplet precautions  - Supportive care   # Troponinemia: Troponin 0.05 in the setting of ESRD. Reports pleuritic chest pain. EKG without signs of acute ischemia. Low suspicion for cardiac etiology at this time. Will continue to monitor as below.  - Trending troponin  - On telemetry   # ESRD: Follows up with Dr. Justin Mend with plan for vascular access and HD this month. Labs stable, K 4.2. Appears volume overload on exam. Supposed to be on Lasix 80 QD and metolazone MWFm but not taking metolazone.  - Nephrology consulted by ED provider, appreciate recommendations  -  Per ED note, plan for dialysis  - BMP in AM   # Anemia of CKD: Baseline Hgb 8-10, currently 9.7. On EPO.  - Continue to monitor, CBC in AM   # T2DM, non-insulin dependent: A1c 6.9 on 12/20 (could be falsely low in the setting of ESRD). On glipizide 5 mg BID.  Metformin discontinued in the setting of ESRD.  - S- SSI + CBG monitoring   # HTN: Hypertensive, sBP 150-170s. On amlodipine, metoprolol and Lasix at home.  - Continue home amlodipine 5 mg - Consider giving one dose of home Lasix 80 mg, but will wait for nephrology recommendations as it appears there are plans for dialysis   F: none  E: monitoring  N: Renal + CM diet   VTE ppx: SQ heparin   Code status: Full code, confirmed on admission    Dispo: Admit patient to Inpatient with expected length of stay greater than 2 midnights.  SignedWelford Roche, MD 08/08/2017, 11:27 AM  Pager: [phone]

## 2017-08-08 NOTE — ED Notes (Signed)
Admitting providers at the bedside. 

## 2017-08-08 NOTE — ED Notes (Signed)
Meal tray ordered for the patient 

## 2017-08-08 NOTE — ED Provider Notes (Signed)
[place] EMERGENCY DEPARTMENT Provider Note   CSN: [id number] Arrival date & time: 08/08/17  0343     History   Chief Complaint Chief Complaint  Patient presents with  . Chest Pain    HPI Blake Santana is a 47 y.o. male.  HPI Blake Santana is a 47 y.o. male with hx of anemia, DM, HTN, CKD not on dialysis yet, presents to ED with complaint of cough, shortness of breath, nasal congestion, nausea, vomiting, abdominal pain, generalized malaise, chest pain.  Patient states his symptoms began 3 days ago.  He did not have any fever.  He has been taking Robitussin and states was given some type of pills by his family doctor that he has seen a few days ago which she did not take.  He states he is unable to keep much fluids down or last 3 days due to vomiting, he states he has not eaten in the last 3 days either.  He denies any diarrhea.  He reports some abdominal "burning."  He denies any recent sick contacts.  He is followed by Dr. Justin Mend with nephrology, and is supposed to start dialysis this month.  Past Medical History:  Diagnosis Date  . Anemia of chronic kidney failure 03/11/2017  . Chronic kidney disease 03/11/2017  . Diabetes mellitus without complication ([place]) 7253  . Edema 01/14/2017  . Hypertension 03/11/2017  . Iron deficiency anemia 06/18/2016    Patient Active Problem List   Diagnosis Date Noted  . Acute CHF (congestive heart failure) ([place]) 04/03/2017  . Anemia of chronic kidney failure 03/11/2017  . Hypertension 03/11/2017  . Chronic kidney disease 03/11/2017  . Edema 01/14/2017  . Dyslipidemia 10/15/2016  . Diabetic retinopathy ([place]) 10/15/2016  . Microalbuminuria 06/18/2016  . Iron deficiency anemia 06/18/2016  . Uncontrolled type 2 diabetes mellitus with complication, without long-term current use of insulin ([place]) 10/20/2001    History reviewed. No pertinent surgical history.     Home Medications    Prior to Admission medications     Medication Sig Start Date End Date Taking? Authorizing Provider  amLODipine (NORVASC) 5 MG tablet Take 1 tablet (5 mg total) by mouth daily. 03/26/17   Mack Hook, MD  atorvastatin (LIPITOR) 40 MG tablet 1 tab by mouth daily with evening meal 07/23/17   Mack Hook, MD  Blood Glucose Monitoring Suppl [facility] 2 PRESTO) w/Device KIT Check sugars twice daily Patient not taking: Reported on 07/15/2017 06/04/16   Mack Hook, MD  ferrous gluconate (FERGON) 225 (27 Fe) MG tablet Take 240 mg by mouth 2 (two) times daily.    [provider]  FLUARIX QUADRIVALENT 0.5 ML injection ADM 0.5ML IM UTD 04/23/17   [provider]  furosemide (LASIX) 80 MG tablet Take 80 mg by mouth. 2 tablets by mouth twice a day    [provider]  glipiZIDE (GLUCOTROL) 5 MG tablet 2 tabs by mouth in the morning with breakfast and 1 tab by mouth with evening meal. 07/15/17   Mack Hook, MD  glucose blood (AGAMATRIX PRESTO TEST) test strip Check sugars twice daily Patient not taking: Reported on 07/15/2017 06/04/16   Mack Hook, MD  metolazone (ZAROXOLYN) 2.5 MG tablet Take 2.5 mg by mouth. 1 tablet by mouth on Monday, Wednesday and friday    [provider]  metoprolol tartrate (LOPRESSOR) 25 MG tablet Take 1 tablet (25 mg total) by mouth 2 (two) times daily. 03/11/17   Mack Hook, MD  terbinafine (LAMISIL) 1 %  cream Apply 1 application topically 2 (two) times daily. 07/15/17   Mack Hook, MD    Family History Family History  Problem Relation Age of Onset  . Diabetes Mother   . Kidney disease Mother        renal failure--diabetes, cause of death  . Hypertension Mother   . Diabetes Father   . Kidney disease Father        Cause of death:  kidney failure form diabetes  . Alcohol abuse Father   . Diabetes Sister     Social History Social History   Tobacco Use  . Smoking status: Former Research scientist (life sciences)  . Smokeless tobacco: Never Used   Substance Use Topics  . Alcohol use: No    Comment: Used to drink too much once weekly, but nothing for 1 year.  . Drug use: No     Allergies   Patient has no known allergies.   Review of Systems Review of Systems  Constitutional: Negative for chills and fever.  HENT: Positive for congestion and sore throat.   Respiratory: Positive for cough and shortness of breath. Negative for chest tightness.   Cardiovascular: Negative for chest pain, palpitations and leg swelling.  Gastrointestinal: Positive for abdominal pain, nausea and vomiting. Negative for abdominal distention and diarrhea.  Genitourinary: Negative for dysuria, frequency, hematuria and urgency.  Musculoskeletal: Positive for myalgias. Negative for arthralgias, neck pain and neck stiffness.  Skin: Negative for rash.  Allergic/Immunologic: Negative for immunocompromised state.  Neurological: Negative for dizziness, weakness, light-headedness, numbness and headaches.  All other systems reviewed and are negative.    Physical Exam Updated Vital Signs BP (!) 180/96   Pulse 85   Temp 98 F (36.7 C) (Oral)   Resp 20   SpO2 96%   Physical Exam  Constitutional: He is oriented to person, place, and time. He appears well-developed and well-nourished. No distress.  HENT:  Head: Normocephalic and atraumatic.  Mouth/Throat: Oropharynx is clear and moist.  Nasal congestion present  Eyes: Conjunctivae are normal.  Neck: Neck supple.  Cardiovascular: Normal rate, regular rhythm and normal heart sounds.  Pulmonary/Chest: Effort normal. No respiratory distress. He has no wheezes. He has rales.  Rales diffusely   Abdominal: Soft. Bowel sounds are normal. He exhibits no distension. There is no tenderness. There is no rebound.  Musculoskeletal: He exhibits no edema.  Neurological: He is alert and oriented to person, place, and time.  Skin: Skin is warm and dry.  Nursing note and vitals reviewed.    ED Treatments / Results   Labs (all labs ordered are listed, but only abnormal results are displayed) Labs Reviewed  BASIC METABOLIC PANEL - Abnormal; Notable for the following components:      Result Value   Sodium 129 (*)    Chloride 97 (*)    CO2 18 (*)    Glucose, Bld 139 (*)    BUN 115 (*)    Creatinine, Ser 9.01 (*)    Calcium 8.4 (*)    GFR calc non Af Amer 6 (*)    GFR calc Af Amer 7 (*)    All other components within normal limits  CBC - Abnormal; Notable for the following components:   RBC 3.50 (*)    Hemoglobin 9.7 (*)    HCT 29.4 (*)    RDW 16.8 (*)    All other components within normal limits  TROPONIN I - Abnormal; Notable for the following components:   Troponin I 0.05 (*)  All other components within normal limits  URINALYSIS, ROUTINE W REFLEX MICROSCOPIC - Abnormal; Notable for the following components:   APPearance CLOUDY (*)    Glucose, UA 150 (*)    Hgb urine dipstick MODERATE (*)    Protein, ur >=300 (*)    Bacteria, UA RARE (*)    Squamous Epithelial / LPF 0-5 (*)    All other components within normal limits  LIPASE, BLOOD - Abnormal; Notable for the following components:   Lipase 81 (*)    All other components within normal limits  INFLUENZA PANEL BY PCR (TYPE A & B)    EKG  EKG Interpretation  Date/Time:  Sunday August 08 2017 03:48:30 EST Ventricular Rate:  91 PR Interval:  140 QRS Duration: 86 QT Interval:  380 QTC Calculation: 467 R Axis:   170 Text Interpretation:  Normal sinus rhythm Right atrial enlargement Right axis deviation Pulmonary disease pattern Abnormal ECG When compared with ECG of 04/02/2017, No significant change was found Confirmed by Delora Fuel ([id number]) on 08/08/2017 4:57:54 AM       Radiology Dg Chest 2 View  Result Date: 08/08/2017 CLINICAL DATA:  Central chest pain. EXAM: CHEST  2 VIEW COMPARISON:  Radiograph 04/02/2017 FINDINGS: Unchanged cardiomegaly and mediastinal contours. Vascular congestion with peribronchial cuffing suspicious for  pulmonary edema. Minimal fluid in the fissures without subpulmonic effusion. No confluent airspace disease. No pneumothorax. No acute osseous abnormality. IMPRESSION: Cardiomegaly with vascular congestion. Peribronchial cuffing suspicious for pulmonary edema. Increased fluid in the fissures. Findings suspicious for fluid overload/CHF. Electronically Signed   By: Jeb Levering M.D.   On: 08/08/2017 04:26    Procedures Procedures (including critical care time)  Medications Ordered in ED Medications - No data to display   Initial Impression / Assessment and Plan / ED Course  I have reviewed the triage vital signs and the nursing notes.  Pertinent labs & imaging results that were available during my care of the patient were reviewed by me and considered in my medical decision making (see chart for details).     Patient in the emergency department with flulike symptoms, nasal congestion, cough, nausea, vomiting, chest pain and shortness of breath.  Patient is currently being followed by nephrology for chronic kidney disease and supposed to start on dialysis this month.  Patient's chest pain is atypical, only worse with coughing, and he clearly has flulike symptoms.  I will send off influenza panel.  Patient is afebrile here, vital signs are stable other than hypertension.  He does not appear to be septic.  His labs did show a creatinine of 9.01 and BUN of 115 today, patient's baseline creatinine in the last few months is 5.  His chest x-ray is concerning for pulmonary edema.  I will consult nephrology.  Patient will be admitted for further treatment  7:44 AM Spoke with internal medicine teaching service, they will admit patient.  7:58 AM Spoke with nephrology, they will consult and dialyze.   Vitals:   08/08/17 0650 08/08/17 0700 08/08/17 0725 08/08/17 0730  BP: (!) 180/96 (!) 177/97  (!) 175/91  Pulse:  85 85 82  Resp:  (!) 21 20 (!) 24  Temp:      TempSrc:      SpO2:  96% 96% 93%      Final Clinical Impressions(s) / ED Diagnoses   Final diagnoses:  Acute pulmonary edema (HCC)  Flu-like symptoms    ED Discharge Orders    None  Jeannett Senior, PA-C 08/08/17 [id number]    Orpah Greek, MD 08/08/17 [phone]

## 2017-08-09 ENCOUNTER — Inpatient Hospital Stay (HOSPITAL_COMMUNITY): Payer: Medicaid Other | Admitting: Certified Registered Nurse Anesthetist

## 2017-08-09 ENCOUNTER — Encounter (HOSPITAL_COMMUNITY)
Admission: EM | Disposition: A | Discharge status: Home / Self Care | Payer: Self-pay | Source: Home / Self Care | Attending: Oncology

## 2017-08-09 ENCOUNTER — Inpatient Hospital Stay (HOSPITAL_COMMUNITY): Payer: Medicaid Other

## 2017-08-09 DIAGNOSIS — B338 Other specified viral diseases: Secondary | ICD-10-CM

## 2017-08-09 DIAGNOSIS — B974 Respiratory syncytial virus as the cause of diseases classified elsewhere: Secondary | ICD-10-CM

## 2017-08-09 DIAGNOSIS — N185 Chronic kidney disease, stage 5: Secondary | ICD-10-CM

## 2017-08-09 HISTORY — PX: AV FISTULA PLACEMENT: SHX1204

## 2017-08-09 HISTORY — PX: INSERTION OF DIALYSIS CATHETER: SHX1324

## 2017-08-09 LAB — BASIC METABOLIC PANEL
ANION GAP: 10 (ref 5–15)
BUN: 117 mg/dL — ABNORMAL HIGH (ref 6–20)
CO2: 21 mmol/L — ABNORMAL LOW (ref 22–32)
Calcium: 8.1 mg/dL — ABNORMAL LOW (ref 8.9–10.3)
Chloride: 103 mmol/L (ref 101–111)
Creatinine, Ser: 9.21 mg/dL — ABNORMAL HIGH (ref 0.61–1.24)
GFR, EST AFRICAN AMERICAN: 7 mL/min — AB (ref >60)
GFR, EST NON AFRICAN AMERICAN: 6 mL/min — AB (ref >60)
Glucose, Bld: 171 mg/dL — ABNORMAL HIGH (ref 65–99)
Potassium: 4.2 mmol/L (ref 3.5–5.1)
SODIUM: 134 mmol/L — AB (ref 135–145)

## 2017-08-09 LAB — CBC
HCT: 27.9 % — ABNORMAL LOW (ref 39.0–52.0)
HCT: 33.6 % — ABNORMAL LOW (ref 39.0–52.0)
HEMOGLOBIN: 9 g/dL — AB (ref 13.0–17.0)
Hemoglobin: 11.2 g/dL — ABNORMAL LOW (ref 13.0–17.0)
MCH: 27 pg (ref 26.0–34.0)
MCH: 27.9 pg (ref 26.0–34.0)
MCHC: 32.3 g/dL (ref 30.0–36.0)
MCHC: 33.3 g/dL (ref 30.0–36.0)
MCV: 83.8 fL (ref 78.0–100.0)
MCV: 83.8 fL (ref 78.0–100.0)
PLATELETS: 231 10*3/uL (ref 150–400)
Platelets: 210 10*3/uL (ref 150–400)
RBC: 3.33 MIL/uL — AB (ref 4.22–5.81)
RBC: 4.01 MIL/uL — ABNORMAL LOW (ref 4.22–5.81)
RDW: 16.4 % — ABNORMAL HIGH (ref 11.5–15.5)
RDW: 16.8 % — ABNORMAL HIGH (ref 11.5–15.5)
WBC: 7.4 10*3/uL (ref 4.0–10.5)
WBC: 7.3 10*3/uL (ref 4.0–10.5)

## 2017-08-09 LAB — COMPREHENSIVE METABOLIC PANEL
ALT: 17 U/L (ref 17–63)
ANION GAP: 14 (ref 5–15)
AST: 16 U/L (ref 15–41)
Albumin: 2.5 g/dL — ABNORMAL LOW (ref 3.5–5.0)
Alkaline Phosphatase: 119 U/L (ref 38–126)
BUN: 112 mg/dL — ABNORMAL HIGH (ref 6–20)
CHLORIDE: 104 mmol/L (ref 101–111)
CO2: 19 mmol/L — AB (ref 22–32)
Calcium: 8.6 mg/dL — ABNORMAL LOW (ref 8.9–10.3)
Creatinine, Ser: 9.17 mg/dL — ABNORMAL HIGH (ref 0.61–1.24)
GFR calc non Af Amer: 6 mL/min — ABNORMAL LOW (ref >60)
GFR, EST AFRICAN AMERICAN: 7 mL/min — AB (ref >60)
Glucose, Bld: 116 mg/dL — ABNORMAL HIGH (ref 65–99)
Potassium: 3.9 mmol/L (ref 3.5–5.1)
SODIUM: 137 mmol/L (ref 135–145)
Total Bilirubin: 0.9 mg/dL (ref 0.3–1.2)
Total Protein: 6.9 g/dL (ref 6.5–8.1)

## 2017-08-09 LAB — CBG MONITORING, ED
Glucose-Capillary: 126 mg/dL — ABNORMAL HIGH (ref 65–99)
Glucose-Capillary: 97 mg/dL (ref 65–99)

## 2017-08-09 LAB — GLUCOSE, CAPILLARY
Glucose-Capillary: 109 mg/dL — ABNORMAL HIGH (ref 65–99)
Glucose-Capillary: 104 mg/dL — ABNORMAL HIGH (ref 65–99)

## 2017-08-09 LAB — APTT: APTT: 33 s (ref 24–36)

## 2017-08-09 LAB — PROTIME-INR
INR: 1.1
PROTHROMBIN TIME: 14.1 s (ref 11.4–15.2)

## 2017-08-09 SURGERY — INSERTION OF DIALYSIS CATHETER
Anesthesia: Monitor Anesthesia Care

## 2017-08-09 SURGERY — ARTERIOVENOUS (AV) FISTULA CREATION
Anesthesia: Monitor Anesthesia Care | Site: Chest | Laterality: Right

## 2017-08-09 MED ORDER — LABETALOL HCL 5 MG/ML IV SOLN: 10.0000 mg | INTRAVENOUS | Status: DC | PRN

## 2017-08-09 MED ORDER — HYDRALAZINE HCL 20 MG/ML IJ SOLN: 5.0000 mg | INTRAMUSCULAR | Status: DC | PRN

## 2017-08-09 MED ORDER — FENTANYL CITRATE (PF) 100 MCG/2ML IJ SOLN
INTRAMUSCULAR | Status: DC | PRN
  Administered 2017-08-09: 50 ug via INTRAVENOUS

## 2017-08-09 MED ORDER — HEPARIN SODIUM (PORCINE) 1000 UNIT/ML IJ SOLN
INTRAMUSCULAR | Status: DC | PRN
  Administered 2017-08-09: 3400 [IU]

## 2017-08-09 MED ORDER — HEPARIN SODIUM (PORCINE) 1000 UNIT/ML IJ SOLN
INTRAMUSCULAR | Status: AC
  Filled 2017-08-09: qty 1

## 2017-08-09 MED ORDER — SENNOSIDES-DOCUSATE SODIUM 8.6-50 MG PO TABS
1.0000 | ORAL_TABLET | Freq: Every evening | ORAL | Status: DC | PRN
Start: 2017-08-09 — End: 2017-08-12

## 2017-08-09 MED ORDER — PHENOL 1.4 % MT LIQD: 1.0000 | OROMUCOSAL | Status: DC | PRN

## 2017-08-09 MED ORDER — GUAIFENESIN-DM 100-10 MG/5ML PO SYRP
15.0000 mL | ORAL_SOLUTION | ORAL | Status: DC | PRN
  Administered 2017-08-09 – 2017-08-11 (×3): 15 mL via ORAL
  Filled 2017-08-09 (×3): qty 15

## 2017-08-09 MED ORDER — MIDAZOLAM HCL 2 MG/2ML IJ SOLN
INTRAMUSCULAR | Status: AC
  Filled 2017-08-09: qty 2

## 2017-08-09 MED ORDER — HEPARIN SODIUM (PORCINE) 5000 UNIT/ML IJ SOLN
INTRAMUSCULAR | Status: DC | PRN
  Administered 2017-08-09: 15:00:00

## 2017-08-09 MED ORDER — LIDOCAINE-EPINEPHRINE 0.5 %-1:200000 IJ SOLN
INTRAMUSCULAR | Status: AC
  Filled 2017-08-09: qty 1

## 2017-08-09 MED ORDER — 0.9 % SODIUM CHLORIDE (POUR BTL) OPTIME
TOPICAL | Status: DC | PRN
  Administered 2017-08-09: 1000 mL

## 2017-08-09 MED ORDER — CEFAZOLIN SODIUM-DEXTROSE 1-4 GM/50ML-% IV SOLN
INTRAVENOUS | Status: DC | PRN
  Administered 2017-08-09: 2 g via INTRAVENOUS

## 2017-08-09 MED ORDER — SODIUM CHLORIDE 0.9 % IV SOLN
INTRAVENOUS | Status: DC
  Administered 2017-08-09: 18:00:00 via INTRAVENOUS

## 2017-08-09 MED ORDER — SODIUM CHLORIDE 0.9 % IV SOLN
125.0000 mg | INTRAVENOUS | Status: DC
  Administered 2017-08-10 – 2017-08-11 (×2): 125 mg via INTRAVENOUS
  Filled 2017-08-09 (×2): qty 10

## 2017-08-09 MED ORDER — OXYCODONE HCL 5 MG PO TABS
5.0000 mg | ORAL_TABLET | ORAL | Status: DC | PRN
Start: 2017-08-09 — End: 2017-08-12
  Administered 2017-08-09 – 2017-08-11 (×3): 5 mg via ORAL
  Filled 2017-08-09 (×3): qty 1

## 2017-08-09 MED ORDER — LIDOCAINE-EPINEPHRINE 0.5 %-1:200000 IJ SOLN
INTRAMUSCULAR | Status: DC | PRN
  Administered 2017-08-09: 14 mL

## 2017-08-09 MED ORDER — SODIUM CHLORIDE 0.9 % IV SOLN
INTRAVENOUS | Status: DC | PRN
Start: 2017-08-09 — End: 2017-08-09
  Administered 2017-08-09: 15:00:00 via INTRAVENOUS

## 2017-08-09 MED ORDER — SODIUM CHLORIDE 0.9 % IV SOLN
125.0000 mg | INTRAVENOUS | Status: DC
  Filled 2017-08-09: qty 10

## 2017-08-09 MED ORDER — ONDANSETRON HCL 4 MG/2ML IJ SOLN
4.0000 mg | Freq: Four times a day (QID) | INTRAMUSCULAR | Status: DC | PRN
  Administered 2017-08-11: 4 mg via INTRAVENOUS
  Filled 2017-08-09: qty 2

## 2017-08-09 MED ORDER — MORPHINE SULFATE (PF) 2 MG/ML IV SOLN: 1.0000 mg | INTRAVENOUS | Status: DC | PRN

## 2017-08-09 MED ORDER — METOPROLOL TARTRATE 5 MG/5ML IV SOLN: 2.0000 mg | INTRAVENOUS | Status: DC | PRN

## 2017-08-09 MED ORDER — PROPOFOL 10 MG/ML IV BOLUS
INTRAVENOUS | Status: DC | PRN
  Administered 2017-08-09: 20 mg via INTRAVENOUS

## 2017-08-09 MED ORDER — GLIPIZIDE 5 MG PO TABS
5.0000 mg | ORAL_TABLET | Freq: Two times a day (BID) | ORAL | Status: DC
  Administered 2017-08-09 – 2017-08-12 (×7): 5 mg via ORAL
  Filled 2017-08-09 (×9): qty 1

## 2017-08-09 MED ORDER — METOPROLOL TARTRATE 25 MG PO TABS
25.0000 mg | ORAL_TABLET | Freq: Two times a day (BID) | ORAL | Status: DC
  Administered 2017-08-09 – 2017-08-12 (×6): 25 mg via ORAL
  Filled 2017-08-09 (×6): qty 1

## 2017-08-09 MED ORDER — DOCUSATE SODIUM 100 MG PO CAPS
100.0000 mg | ORAL_CAPSULE | Freq: Two times a day (BID) | ORAL | Status: DC
  Administered 2017-08-09 – 2017-08-12 (×6): 100 mg via ORAL
  Filled 2017-08-09 (×6): qty 1

## 2017-08-09 MED ORDER — MIDAZOLAM HCL 2 MG/2ML IJ SOLN
INTRAMUSCULAR | Status: DC | PRN
  Administered 2017-08-09: 2 mg via INTRAVENOUS

## 2017-08-09 MED ORDER — HEPARIN SODIUM (PORCINE) 5000 UNIT/ML IJ SOLN
5000.0000 [IU] | Freq: Three times a day (TID) | INTRAMUSCULAR | Status: DC
  Administered 2017-08-09 – 2017-08-12 (×7): 5000 [IU] via SUBCUTANEOUS
  Filled 2017-08-09 (×6): qty 1

## 2017-08-09 MED ORDER — PANTOPRAZOLE SODIUM 40 MG PO TBEC
40.0000 mg | DELAYED_RELEASE_TABLET | Freq: Every day | ORAL | Status: DC
  Administered 2017-08-09 – 2017-08-12 (×4): 40 mg via ORAL
  Filled 2017-08-09 (×4): qty 1

## 2017-08-09 MED ORDER — FENTANYL CITRATE (PF) 250 MCG/5ML IJ SOLN
INTRAMUSCULAR | Status: AC
  Filled 2017-08-09: qty 5

## 2017-08-09 MED ORDER — PROPOFOL 500 MG/50ML IV EMUL
INTRAVENOUS | Status: DC | PRN
  Administered 2017-08-09: 75 ug/kg/min via INTRAVENOUS

## 2017-08-09 MED ORDER — POTASSIUM CHLORIDE CRYS ER 20 MEQ PO TBCR: 20.0000 meq | EXTENDED_RELEASE_TABLET | Freq: Once | ORAL | Status: DC

## 2017-08-09 MED ORDER — BISACODYL 5 MG PO TBEC: 5.0000 mg | DELAYED_RELEASE_TABLET | Freq: Every day | ORAL | Status: DC | PRN

## 2017-08-09 MED ORDER — ALUM & MAG HYDROXIDE-SIMETH 200-200-20 MG/5ML PO SUSP: 15.0000 mL | ORAL | Status: DC | PRN

## 2017-08-09 SURGICAL SUPPLY — 52 items
ADH SKN CLS APL DERMABOND .7 (GAUZE/BANDAGES/DRESSINGS) ×4
ARMBAND PINK RESTRICT EXTREMIT (MISCELLANEOUS) ×8 IMPLANT
BAG DECANTER FOR FLEXI CONT (MISCELLANEOUS) ×4 IMPLANT
BIOPATCH RED 1 DISK 7.0 (GAUZE/BANDAGES/DRESSINGS) ×3 IMPLANT
BIOPATCH RED 1IN DISK 7.0MM (GAUZE/BANDAGES/DRESSINGS) ×1
CANISTER SUCT 3000ML PPV (MISCELLANEOUS) ×4 IMPLANT
CANNULA VESSEL 3MM 2 BLNT TIP (CANNULA) ×4 IMPLANT
CATH PALINDROME RT-P 15FX19CM (CATHETERS) IMPLANT
CATH PALINDROME RT-P 15FX23CM (CATHETERS) ×2 IMPLANT
CATH PALINDROME RT-P 15FX28CM (CATHETERS) IMPLANT
CATH PALINDROME RT-P 15FX55CM (CATHETERS) IMPLANT
CLIP LIGATING EXTRA MED SLVR (CLIP) ×4 IMPLANT
CLIP LIGATING EXTRA SM BLUE (MISCELLANEOUS) ×4 IMPLANT
COVER PROBE W GEL 5X96 (DRAPES) ×4 IMPLANT
COVER SURGICAL LIGHT HANDLE (MISCELLANEOUS) ×4 IMPLANT
DECANTER SPIKE VIAL GLASS SM (MISCELLANEOUS) ×4 IMPLANT
DERMABOND ADVANCED (GAUZE/BANDAGES/DRESSINGS) ×4
DERMABOND ADVANCED .7 DNX12 (GAUZE/BANDAGES/DRESSINGS) ×2 IMPLANT
DRAPE C-ARM 42X72 X-RAY (DRAPES) ×4 IMPLANT
DRAPE CHEST BREAST 15X10 FENES (DRAPES) ×4 IMPLANT
DRSG COVADERM 4X6 (GAUZE/BANDAGES/DRESSINGS) ×2 IMPLANT
ELECT REM PT RETURN 9FT ADLT (ELECTROSURGICAL) ×4
ELECTRODE REM PT RTRN 9FT ADLT (ELECTROSURGICAL) ×2 IMPLANT
GLOVE SS BIOGEL STRL SZ 7.5 (GLOVE) ×2 IMPLANT
GLOVE SUPERSENSE BIOGEL SZ 7.5 (GLOVE) ×2
GOWN STRL REUS W/ TWL LRG LVL3 (GOWN DISPOSABLE) ×6 IMPLANT
GOWN STRL REUS W/TWL LRG LVL3 (GOWN DISPOSABLE) ×12
KIT BASIN OR (CUSTOM PROCEDURE TRAY) ×4 IMPLANT
KIT ROOM TURNOVER OR (KITS) ×4 IMPLANT
NDL 18GX1X1/2 (RX/OR ONLY) (NEEDLE) ×2 IMPLANT
NDL HYPO 25GX1X1/2 BEV (NEEDLE) ×2 IMPLANT
NEEDLE 18GX1X1/2 (RX/OR ONLY) (NEEDLE) ×4 IMPLANT
NEEDLE 22X1 1/2 (OR ONLY) (NEEDLE) IMPLANT
NEEDLE HYPO 25GX1X1/2 BEV (NEEDLE) ×4 IMPLANT
NS IRRIG 1000ML POUR BTL (IV SOLUTION) ×4 IMPLANT
PACK CV ACCESS (CUSTOM PROCEDURE TRAY) ×4 IMPLANT
PACK SURGICAL SETUP 50X90 (CUSTOM PROCEDURE TRAY) ×4 IMPLANT
PAD ARMBOARD 7.5X6 YLW CONV (MISCELLANEOUS) ×8 IMPLANT
SOAP 2 % CHG 4 OZ (WOUND CARE) ×4 IMPLANT
SUT ETHILON 3 0 PS 1 (SUTURE) ×4 IMPLANT
SUT PROLENE 6 0 CC (SUTURE) ×4 IMPLANT
SUT VIC AB 3-0 SH 27 (SUTURE) ×4
SUT VIC AB 3-0 SH 27X BRD (SUTURE) ×2 IMPLANT
SUT VICRYL 4-0 PS2 18IN ABS (SUTURE) ×4 IMPLANT
SYR 10ML LL (SYRINGE) ×4 IMPLANT
SYR 20CC LL (SYRINGE) ×4 IMPLANT
SYR 5ML LL (SYRINGE) ×8 IMPLANT
SYR CONTROL 10ML LL (SYRINGE) ×4 IMPLANT
TOWEL GREEN STERILE (TOWEL DISPOSABLE) ×8 IMPLANT
TOWEL GREEN STERILE FF (TOWEL DISPOSABLE) ×4 IMPLANT
UNDERPAD 30X30 (UNDERPADS AND DIAPERS) ×4 IMPLANT
WATER STERILE IRR 1000ML POUR (IV SOLUTION) ×4 IMPLANT

## 2017-08-09 NOTE — Progress Notes (Signed)
Patient ID: Blake Santana, male   DOB: [birth year], 47 y.o.   MRN: [id number]  Martin Majestic to ED to see pt for IR Dialysis catheter placement. OR personnel in room to take pt RN getting consent for Dr Early to place catheter.  Will cancel IR cath placement

## 2017-08-09 NOTE — Anesthesia Preprocedure Evaluation (Addendum)
Anesthesia Evaluation  Patient identified by MRN, date of birth, ID band Patient awake    Reviewed: Allergy & Precautions, NPO status , Patient's Chart, lab work & pertinent test results  Airway Mallampati: II   Neck ROM: Full    Dental  (+) Poor Dentition, Dental Advisory Given,    Pulmonary former smoker,  Current RSV infection   + rhonchi        Cardiovascular hypertension, +CHF  negative cardio ROS   Rhythm:Regular  Echo 04/2017 - Left ventricle: The cavity size was normal. Wall thickness was normal. Systolic function was normal. The estimated ejection fraction was in the range of 50% to 55%. Wall motion was normal; there were no regional wall motion abnormalities. Features are consistent with a pseudonormal left ventricular filling pattern, with concomitant abnormal relaxation and increased filling pressure (grade 2 diastolic dysfunction). Doppler parameters are consistent with high ventricular filling pressure. - Mitral valve: There was mild regurgitation. - Left atrium: The atrium was moderately dilated. - Pulmonary arteries: Systolic pressure was mildly increased. PA peak pressure: 35 mm Hg (S). - Pericardium, extracardiac: A small pericardial effusion was identified.  Impressions: - Normal LV systolic function; moderate diastolic dysfunction;  elevated LV filling pressure; mild MR; moderate LAE; mild TR with mildly elevated pulmonary pressure; small pericardial effusion.   Neuro/Psych negative neurological ROS  negative psych ROS   GI/Hepatic negative GI ROS, Neg liver ROS,   Endo/Other  negative endocrine ROSdiabetes  Renal/GU ESRFRenal diseasenegative Renal ROS  negative genitourinary   Musculoskeletal negative musculoskeletal ROS (+)   Abdominal   Peds negative pediatric ROS (+)  Hematology  (+) anemia ,   Anesthesia Other Findings   Reproductive/Obstetrics                            Anesthesia Physical Anesthesia Plan  ASA: III  Anesthesia Plan: MAC   Post-op Pain Management:    Induction: Intravenous  PONV Risk Score and Plan: 1 and Propofol infusion  Airway Management Planned: Simple Face Mask  Additional Equipment:   Intra-op Plan:   Post-operative Plan:   Informed Consent: I have reviewed the patients History and Physical, chart, labs and discussed the procedure including the risks, benefits and alternatives for the proposed anesthesia with the patient or authorized representative who has indicated his/her understanding and acceptance.   Dental advisory given  Plan Discussed with: CRNA  Anesthesia Plan Comments:        Anesthesia Quick Evaluation

## 2017-08-09 NOTE — ED Notes (Signed)
Patient is NPO, No diet was ordered for Lunch.

## 2017-08-09 NOTE — ED Notes (Signed)
Patient CBG was 97.

## 2017-08-09 NOTE — Progress Notes (Signed)
New Admission Note:   Arrival Method: Bed Mental Orientation: A&O X4 Telemetry: initiated Assessment: Completed Skin: See flowsheets IV: WDL Pain: 0 Tubes: R IJ HD Cath Admission: Admission RN called Unit Orientation: Patient has been orientated to the room, unit and staff.   Orders have been reviewed and implemented. Will continue to monitor the patient. Call light has been placed within reach and bed alarm has been activated.    Dixie Dials RN, BSN

## 2017-08-09 NOTE — Transfer of Care (Signed)
Immediate Anesthesia Transfer of Care Note  Patient: Blake Santana  Procedure(s) Performed: LEFT RADIOCEPHALIC ARTERIOVENOUS (AV) FISTULA CREATION (Left Arm Lower) INSERTION OF DIALYSIS CATHETER RIGHT INTERNAL JUGULAR (Right Chest)  Patient Location: PACU  Anesthesia Type:MAC  Level of Consciousness: awake, alert  and oriented  Airway & Oxygen Therapy: Patient Spontanous Breathing  Post-op Assessment: Report given to RN and Post -op Vital signs reviewed and stable    Post vital signs: Reviewed and stable  Last Vitals:  Vitals:   08/09/17 1635 08/09/17 1649  BP:  (!) 157/94  Pulse: 92 94  Resp: 15 17  Temp: 37 C   SpO2: 92% 100%    Last Pain:  Vitals:   08/09/17 1700  TempSrc:   PainSc: 0-No pain         Complications: No apparent anesthesia complications

## 2017-08-09 NOTE — Progress Notes (Signed)
   Subjective:  No acute events overnight.  Patient states that he is feeling much better this morning.  Denies chest pain, shortness of breath, cough, and sore throat.  Discussed need to start hemodialysis during this admission.  Also discussed positive RSV likely complicating respiratory status.  Patient voiced understanding and is in agreement with plan.  All questions answered.  Objective:  Vital signs in last 24 hours: Vitals:   08/08/17 1323 08/08/17 1831 08/08/17 2304 08/09/17 0353  BP:  (!) 166/89 (!) 173/90 (!) 169/90  Pulse:  79 81 65  Resp:  (!) 27 14   Temp: 97.8 F (36.6 C) 98.4 F (36.9 C)    TempSrc: Oral Oral    SpO2:  98% 99% 93%   General: Pleasant male, thin, well-developed, lying in bed in no acute distress HENT: NCAT, neck supple and FROM, OP clear without exudates or erythema, poor dentition  Eyes: anicteric sclera, PERRL Cardiac: regular rate and rhythm, nl S1/S2, no murmurs, rubs or gallops  Pulm: Diffuse crackles similar to yesterday, no increased work of breathing on room air  Abd: soft, NTND, no focal deficits  Ext: warm and well perfused, trace pedal edema   Assessment/Plan:  Blake Santana is a 47 yo male with history of ESRD not on HD, T2DM, and HTN who presents to the ED with flu like symptoms and volume overload in the setting of ESRD.   # RSV pneumonitis: Patient presented with flulike symptoms that have now improved with supportive care.  He denies chest pain, shortness of breath, and cough this morning.  Respiratory status remained stable.  He is oxygenating well on room air.  Continues to have diffuse crackles on exam.  Plan for dialysis today.   - Continue to monitor respiratory status   - Continue droplet precautions  - Supportive care   # ESRD: Follows up with Dr. Justin Mend.  Hypervolemic on exam. Plan to start HD today. RIJ placed today.  - Nephrology following, appreciate recommendations  - Adding Renvela for elevated phos   #  Troponinemia: Troponin peaked at 0.08.  He denies ischemic symptoms.  Likely secondary to ESRD. We will continue to monitor for ischemic symptoms suspicion for cardiac etiology at this time - On telemetry   # Anemia of CKD: Baseline Hgb 8-10, currently 9.7. On EPO.  - Continue to monitor  # T2DM, non-insulin dependent: A1c 6.9 on 12/20 (could be falsely low in the setting of ESRD). On glipizide 5 mg BID.  Metformin discontinued in the setting of ESRD.  - S- SSI + CBG monitoring   # HTN: Hypertensive, sBP 150-170s. On amlodipine, metoprolol and Lasix at home.  - Continue home amlodipine 5 mg - HD today     Dispo: Anticipated discharge in approximately 2-3 day(s).   Welford Roche, MD 08/09/2017, 6:03 AM Pager: [phone]

## 2017-08-09 NOTE — ED Notes (Signed)
Spoke with patient via video interpreter about plan of care, NPO status, upcoming tests/procedures. Pt verbalized understanding. Denies pain. VSS.

## 2017-08-09 NOTE — ED Triage Notes (Signed)
OR permitt signed

## 2017-08-09 NOTE — Op Note (Signed)
    OPERATIVE REPORT  DATE OF SURGERY: 08/09/2017  PATIENT: Blake Santana, 47 y.o. male MRN: [id number]  DOB: [birth year]  PRE-OPERATIVE DIAGNOSIS: End-stage renal disease  POST-OPERATIVE DIAGNOSIS:  Same  PROCEDURE: Right IJ hemodialysis catheter with SonoSite, #2 left radiocephalic AV fistula creation  SURGEON:  Curt Jews, M.D.  PHYSICIAN ASSISTANT: Gerri Lins PA-C  ANESTHESIA: Local with sedation  EBL: Minimal ml  Total I/O In: -  Out: 400 [Urine:400]  BLOOD ADMINISTERED: None  DRAINS: None  SPECIMEN: None  COUNTS CORRECT:  YES  PLAN OF CARE: PACU stable with chest x-ray pending  PATIENT DISPOSITION:  PACU - hemodynamically stable  PROCEDURE DETAILS: The patient was taken to the operating placed in supine position where the area of the right and left neck and chest were prepped and draped in usual sterile fashion.  Patient was placed in Trendelenburg position and using.  A guidewire was passed down to the level of right atrium and this was confirmed with fluoroscopy.  A dilator and peel-away sheath was passed over the guidewire and the dilator a 23 cm catheter was passed through the peel-away sheath which was removed.  The catheter tip was positioned in the level of the distal right atrium.  The catheter was brought through a subcutaneous tunnel through a separate stab incision.  2 lm ports were attached and both lumens flushed and aspirated easily and were locked with 1000 unit/cc heparin.  The catheter was secured to the skin with a 3-0 nylon stitch and the entry site was closed with a 4-0 subcuticular Vicryl stitch.  Sterile dressing was applied.  Attention was then turned to the left arm.  Patient had a easily visualized a large caliber cephalic vein through his wrist up through his antecubital space.  There was a very dominant branch in the cephalic vein just above the wrist.  This was marked on the skin.  Using local anesthesia cephalic vein was mobilized  through an incision between the level of the cephalic vein and the radial wrist.  The vein was mobilized and tributary branches were ligated with 3-0 silk ties and divided.  The vein was marked to prevent twisting and was ligated and divided distally.  The vein was gently dilated and was of good caliber.  The vein was brought into approximation of the radial artery at the same incision.  The radial artery was occluded proximally and distally with Serafin clamps and was opened with an 11 blade and extended longitudinally with Potts scissors.  The vein was cut to the appropriate length and was spatulated and sewn end-to-side to the.  Clamps were removed and excellent thrill was noted.  Separate incision was made over the prior identified branch and this was ligated with a 3-0 silk tie.  Wounds were irrigated with saline.  Hemostasis obtained with cautery.  The wounds were closed with 3-0 Vicryl in the subcutaneous and subcuticular tissue.  Sterile dressing was applied.  The patient was transferred to the recovery room with chest x-ray pending   [name], M.D., [facility] 08/09/2017 4:35 PM

## 2017-08-09 NOTE — ED Notes (Signed)
Patient CBG was 126.

## 2017-08-09 NOTE — Anesthesia Procedure Notes (Signed)
Procedure Name: MAC Date/Time: 08/09/2017 2:43 PM Performed by: Leonor Liv, CRNA Pre-anesthesia Checklist: Patient identified, Emergency Drugs available, Suction available and Patient being monitored Oxygen Delivery Method: Simple face mask Placement Confirmation: positive ETCO2 Dental Injury: Teeth and Oropharynx as per pre-operative assessment

## 2017-08-09 NOTE — Consult Note (Signed)
[facility] NOTE   MRN : [id number]  Reason for Consult: ESRD Referring Physician: Dr. Justin Santana  History of Present Illness: [name] who has CKD, now ESRD and is followed by Dr. Justin Santana.  We have Been asked to provide permanent access.  Past medical history includes: DM, hyperlipidemia, and HTN.    He reported to the ED with SOB, cold symptoms, and lower extrimitiy edema.  He is on droplet precautions for RSV.       Current Facility-Administered Medications  Medication Dose Route Frequency Provider Last Rate Last Dose  . amLODipine (NORVASC) tablet 5 mg  5 mg Oral Daily Blake Noss, MD   5 mg at 08/09/17 0924  . atorvastatin (LIPITOR) tablet 40 mg  40 mg Oral Daily Blake Noss, MD   40 mg at 08/09/17 0924  . ferric gluconate (NULECIT) 125 mg in sodium chloride 0.9 % 100 mL IVPB  125 mg Intravenous Q M,W,F-HD Blake Oh, MD      . insulin aspart (novoLOG) injection 0-9 Units  0-9 Units Subcutaneous TID WC Blake Noss, MD   Stopped at 08/09/17 1153  . multivitamin (RENA-VIT) tablet 1 tablet  1 tablet Oral QHS Blake Child, PA-C   1 tablet at 08/08/17 2251  . ondansetron (ZOFRAN) injection 4 mg  4 mg Intravenous Once Santana, Tatyana, PA-C      . sevelamer carbonate (RENVELA) tablet 1,600 mg  1,600 mg Oral TID WC Blake Child, PA-C   1,600 mg at 08/09/17 1156   Current Outpatient Medications  Medication Sig Dispense Refill  . amLODipine (NORVASC) 5 MG tablet Take 1 tablet (5 mg total) by mouth daily. 30 tablet 11  . atorvastatin (LIPITOR) 40 MG tablet 1 tab by mouth daily with evening meal (Patient taking differently: Take 40 mg by mouth daily. ) 30 tablet 11  . furosemide (LASIX) 80 MG tablet Take 80 mg by mouth. 2 tablets by mouth twice a day    . glipiZIDE (GLUCOTROL) 5 MG tablet 2 tabs by mouth in the morning with breakfast and 1 tab by mouth with evening meal. (Patient taking differently: Take 5 mg by mouth 2 (two)  times daily before a meal. ) 60 tablet 11  . metolazone (ZAROXOLYN) 2.5 MG tablet Take 2.5 mg by mouth. 1 tablet by mouth on Monday, Wednesday and friday    . metoprolol tartrate (LOPRESSOR) 25 MG tablet Take 1 tablet (25 mg total) by mouth 2 (two) times daily. 60 tablet 11  . Blood Glucose Monitoring Suppl (AGAMATRIX PRESTO) w/Device KIT Check sugars twice daily 1 kit 0  . glucose blood (AGAMATRIX PRESTO TEST) test strip Check sugars twice daily 100 each 12    Pt meds include: Statin :Yes Betablocker: Yes ASA: No Other anticoagulants/antiplatelets: none  Past Medical History:  Diagnosis Date  . Anemia of chronic kidney failure 03/11/2017  . Chronic kidney disease 03/11/2017  . Diabetes mellitus without complication ([place]) 9892  . Edema 01/14/2017  . Hypertension 03/11/2017  . Iron deficiency anemia 06/18/2016    History reviewed. No pertinent surgical history.  Social History Social History   Tobacco Use  . Smoking status: Former Research scientist (life sciences)  . Smokeless tobacco: Never Used  Substance Use Topics  . Alcohol use: No    Comment: Used to drink too much once weekly, but nothing for 1 year.  . Drug use: No    Family History Family History  Problem Relation Age of Onset  .  Diabetes Mother   . Kidney disease Mother        renal failure--diabetes, cause of death  . Hypertension Mother   . Diabetes Father   . Kidney disease Father        Cause of death:  kidney failure form diabetes  . Alcohol abuse Father   . Diabetes Sister     No Known Allergies   REVIEW OF SYSTEMS  General: _0  Weight loss, _1  Fever, _2  chills Neurologic: _3  Dizziness, _4  Blackouts, _5  Seizure _6  Stroke, _7  "Mini stroke", _8  Slurred speech, _9  Temporary blindness; _10  weakness in arms or legs, _11  Hoarseness _12  Dysphagia Cardiac: _13  Chest pain/pressure, _14  Shortness of breath at rest _15  Shortness of breath with exertion, _16  Atrial fibrillation or irregular heartbeat  Vascular: _17  Pain in legs  with walking, _18  Pain in legs at rest, _19  Pain in legs at night,  _20  Non-healing ulcer, _21  Blood clot in vein/DVT,   Pulmonary: _22  Home oxygen, [x ] Productive cough, _23  Coughing up blood, _24  Asthma,  _25  Wheezing _26  COPD Musculoskeletal:  _27  Arthritis, _28  Low back pain, _29  Joint pain Hematologic: _30  Easy Bruising, _31  Anemia; _32  Hepatitis Gastrointestinal: _33  Blood in stool, _34  Gastroesophageal Reflux/heartburn, Urinary: [ x] chronic Kidney disease, _35  on HD - _36  MWF or _37  TTHS, _38  Burning with urination, _39  Difficulty urinating Skin: _40  Rashes, _41  Wounds Psychological: _42  Anxiety, _43  Depression  Physical Examination Vitals:   08/08/17 2304 08/09/17 0353 08/09/17 0811 08/09/17 0815  BP: (!) 173/90 (!) 169/90 (!) 165/93 (!) 165/93  Pulse: 81 65 76 77  Resp: 14  17 (!) 28  Temp:    98.8 F (37.1 C)  TempSrc:    Oral  SpO2: 99% 93% 99% 97%   There is no height or weight on file to calculate BMI.  General:  WDWN in NAD HENT: WNL Eyes: Pupils equal Pulmonary: normal non-labored breathing , positive Rales, rhonchi,  wheezing Cardiac: RRR, without  Murmurs, rubs or gallops; No carotid bruits Abdomen: soft, NT, no masses Skin: no rashes, ulcers noted;  no Gangrene , no cellulitis; no open wounds;   Vascular Exam/Pulses:Radial, brachial and DP/PT pulses B 2+   Musculoskeletal: no muscle wasting or atrophy; moderate LE  edema  Neurologic: A&O X 3; Appropriate Affect ;  SENSATION: normal; MOTOR FUNCTION: 5/5 Symmetric Speech is fluent/normal   Significant Diagnostic Studies: CBC Lab Results  Component Value Date   WBC 7.4 08/09/2017   HGB 9.0 (L) 08/09/2017   HCT 27.9 (L) 08/09/2017   MCV 83.8 08/09/2017   PLT 210 08/09/2017    BMET    Component Value Date/Time   NA 134 (L) 08/09/2017 0417   NA 140 04/15/2017 1653   K 4.2 08/09/2017 0417   CL 103 08/09/2017 0417   CO2 21 (L) 08/09/2017 0417   GLUCOSE 171 (H) 08/09/2017 0417   BUN 117 (H)  08/09/2017 0417   BUN 69 (H) 04/15/2017 1653   CREATININE 9.21 (H) 08/09/2017 0417   CALCIUM 8.1 (L) 08/09/2017 0417   GFRNONAA 6 (L) 08/09/2017 0417   GFRAA 7 (L) 08/09/2017 0417   CrCl cannot be calculated (Unknown ideal weight.).  COAG Lab Results  Component Value Date   INR 1.02 04/07/2017  Non-Invasive Vascular Imaging: Pending vein mapping  ASSESSMENT/PLAN:  ESRD  I have ordered vein mapping.  He is right hand dominant.  Through the mobile interpreter we discussed the vein mapping to help determine his options for fistula verses graft.  Once the vein mapping is complete we will plan permanent access surgery later this week.   IR has been consult to place IJ cath today.   Roxy Horseman 08/09/2017 12:32 PM   I have examined the patient, reviewed and agree with above.  The patient has been n.p.o.  Has a very nice cephalic vein at the antecubital space approximately on the left.  As IV on the right.  Discussed need for permanent access and also tunneled catheter.  We hopefully can proceed with this this afternoon and add this onto the schedule.  Curt Jews, MD 08/09/2017 1:21 PM

## 2017-08-09 NOTE — Progress Notes (Signed)
[facility] ROUNDING NOTE   Subjective:   47 y.o. male with ESRD with long-standing DM, HTN, proteinuria and edema, mild diastolic CHF with EF 76-72%. Renal biopsy in 04/2017 showing advanced diabetic glomerulosclerosis   CKD has progressed to ESRD. He was most recently seen in office this week on 1/2 and CLIP started.    He understands that dialysis is needed now, he wasn't sure exactly when he was supposed to start, but is agreeable to proceed         Objective:  Vital signs in last 24 hours:  Temp:  [97.8 F (36.6 C)-98.8 F (37.1 C)] 98.8 F (37.1 C) (01/07 0815) Pulse Rate:  [65-84] 77 (01/07 0815) Resp:  [14-28] 28 (01/07 0815) BP: (149-173)/(88-107) 165/93 (01/07 0815) SpO2:  [93 %-99 %] 97 % (01/07 0815)  Weight change:  There were no vitals filed for this visit.  Intake/Output: No intake/output data recorded.   Intake/Output this shift:  Total I/O In: -  Out: 400 [Urine:400]  CVS- RRR RS- CTA ABD- BS present soft non-distended EXT- no edema   Basic Metabolic Panel: Recent Labs  Lab 08/08/17 0350 08/08/17 1020 08/09/17 0417  NA 129*  --  134*  K 4.2  --  4.2  CL 97*  --  103  CO2 18*  --  21*  GLUCOSE 139*  --  171*  BUN 115*  --  117*  CREATININE 9.01*  --  9.21*  CALCIUM 8.4*  --  8.1*  PHOS  --  6.8*  --     Liver Function Tests: No results for input(s): AST, ALT, ALKPHOS, BILITOT, PROT, ALBUMIN in the last 168 hours. Recent Labs  Lab 08/08/17 0350  LIPASE 81*   No results for input(s): AMMONIA in the last 168 hours.  CBC: Recent Labs  Lab 08/04/17 0835 08/08/17 0350 08/09/17 0417  WBC  --  8.1 7.4  HGB 10.1* 9.7* 9.0*  HCT  --  29.4* 27.9*  MCV  --  84.0 83.8  PLT  --  191 210    Cardiac Enzymes: Recent Labs  Lab 08/08/17 0350 08/08/17 1020 08/08/17 2122  TROPONINI 0.05* 0.08* 0.04*    BNP: Invalid input(s): POCBNP  CBG: Recent Labs  Lab 08/08/17 2311 08/09/17 0805  GLUCAP 224* 126*     Microbiology: Results for orders placed or performed during the hospital encounter of 08/08/17  Respiratory Panel by PCR     Status: Abnormal   Collection Time: 08/08/17  1:21 PM  Result Value Ref Range Status   Adenovirus NOT DETECTED NOT DETECTED Final   Coronavirus 229E NOT DETECTED NOT DETECTED Final   Coronavirus HKU1 NOT DETECTED NOT DETECTED Final   Coronavirus NL63 NOT DETECTED NOT DETECTED Final   Coronavirus OC43 NOT DETECTED NOT DETECTED Final   Metapneumovirus NOT DETECTED NOT DETECTED Final   Rhinovirus / Enterovirus NOT DETECTED NOT DETECTED Final   Influenza A NOT DETECTED NOT DETECTED Final   Influenza B NOT DETECTED NOT DETECTED Final   Parainfluenza Virus 1 NOT DETECTED NOT DETECTED Final   Parainfluenza Virus 2 NOT DETECTED NOT DETECTED Final   Parainfluenza Virus 3 NOT DETECTED NOT DETECTED Final   Parainfluenza Virus 4 NOT DETECTED NOT DETECTED Final   Respiratory Syncytial Virus DETECTED (A) NOT DETECTED Final    Comment: CRITICAL RESULT CALLED TO, READ BACK BY AND VERIFIED WITH: KKURTLAND @1750  08/08/17 BY LHOWARD    Bordetella pertussis NOT DETECTED NOT DETECTED Final   Chlamydophila pneumoniae NOT  DETECTED NOT DETECTED Final   Mycoplasma pneumoniae NOT DETECTED NOT DETECTED Final    Coagulation Studies: No results for input(s): LABPROT, INR in the last 72 hours.  Urinalysis: Recent Labs    08/08/17 0354  COLORURINE YELLOW  LABSPEC 1.015  PHURINE 5.0  GLUCOSEU 150*  HGBUR MODERATE*  BILIRUBINUR NEGATIVE  KETONESUR NEGATIVE  PROTEINUR >=300*  NITRITE NEGATIVE  LEUKOCYTESUR NEGATIVE      Imaging: Dg Chest 2 View  Result Date: 08/08/2017 CLINICAL DATA:  Central chest pain. EXAM: CHEST  2 VIEW COMPARISON:  Radiograph 04/02/2017 FINDINGS: Unchanged cardiomegaly and mediastinal contours. Vascular congestion with peribronchial cuffing suspicious for pulmonary edema. Minimal fluid in the fissures without subpulmonic effusion. No confluent airspace  disease. No pneumothorax. No acute osseous abnormality. IMPRESSION: Cardiomegaly with vascular congestion. Peribronchial cuffing suspicious for pulmonary edema. Increased fluid in the fissures. Findings suspicious for fluid overload/CHF. Electronically Signed   By: Jeb Levering M.D.   On: 08/08/2017 04:26     Medications:    . amLODipine  5 mg Oral Daily  . atorvastatin  40 mg Oral Daily  . insulin aspart  0-9 Units Subcutaneous TID WC  . multivitamin  1 tablet Oral QHS  . ondansetron (ZOFRAN) IV  4 mg Intravenous Once  . sevelamer carbonate  1,600 mg Oral TID WC     Assessment/ Plan:  1. ESRD 2/2 diabetic nephropathy. Seen in office last week with CLIP started.  Now admitted with volume overload and uremia. Will start HD during admit. Have consulted [facility] for access placement, will need catheter placed as well as permanent access. Hopefully can start HD tomorrow after catheter placed.  Orders written for HD 2.5h 2K/2Ca BFR 250/500 No Heparin  2.  Hypertension/volume - BP elevated with volume. Should improve with HD, follow. Current meds amlodipine, furosemide   3.  Anemia  - Hgb 9.7. Procrit dosed 1/2. Tsat 14% Ferritin 233 on 07/13/17 , recheck and replete Fe as needed  4.  Metabolic bone disease -  Phos elevated. Add Renvela binder. Check iPTH  5.  Nutrition - Renal/carb mod diet/ Add Renal vitamin  6. DM - per primary       LOS: 1 Takeo Harts W @TODAY @10 :08 AM

## 2017-08-10 ENCOUNTER — Encounter (HOSPITAL_COMMUNITY): Payer: Self-pay | Admitting: General Practice

## 2017-08-10 ENCOUNTER — Other Ambulatory Visit: Payer: Self-pay

## 2017-08-10 ENCOUNTER — Other Ambulatory Visit (HOSPITAL_COMMUNITY): Payer: Self-pay

## 2017-08-10 ENCOUNTER — Inpatient Hospital Stay (HOSPITAL_COMMUNITY): Admission: RE | Admit: 2017-08-10 | Payer: Self-pay | Source: Ambulatory Visit

## 2017-08-10 ENCOUNTER — Inpatient Hospital Stay (HOSPITAL_COMMUNITY)
Admission: RE | Admit: 2017-08-10 | Discharge: 2017-08-10 | Disposition: A | Discharge status: Home / Self Care | Payer: Self-pay | Source: Ambulatory Visit | Attending: Nephrology | Admitting: Nephrology

## 2017-08-10 DIAGNOSIS — B974 Respiratory syncytial virus as the cause of diseases classified elsewhere: Secondary | ICD-10-CM

## 2017-08-10 LAB — BASIC METABOLIC PANEL
Anion gap: 11 (ref 5–15)
BUN: 110 mg/dL — ABNORMAL HIGH (ref 6–20)
CALCIUM: 8.1 mg/dL — AB (ref 8.9–10.3)
CO2: 21 mmol/L — ABNORMAL LOW (ref 22–32)
CREATININE: 8.85 mg/dL — AB (ref 0.61–1.24)
Chloride: 103 mmol/L (ref 101–111)
GFR calc non Af Amer: 6 mL/min — ABNORMAL LOW (ref >60)
GFR, EST AFRICAN AMERICAN: 7 mL/min — AB (ref >60)
Glucose, Bld: 156 mg/dL — ABNORMAL HIGH (ref 65–99)
Potassium: 4.1 mmol/L (ref 3.5–5.1)
SODIUM: 135 mmol/L (ref 135–145)

## 2017-08-10 LAB — GLUCOSE, CAPILLARY
GLUCOSE-CAPILLARY: 82 mg/dL (ref 65–99)
GLUCOSE-CAPILLARY: 139 mg/dL — AB (ref 65–99)
Glucose-Capillary: 135 mg/dL — ABNORMAL HIGH (ref 65–99)
Glucose-Capillary: 111 mg/dL — ABNORMAL HIGH (ref 65–99)
Glucose-Capillary: 195 mg/dL — ABNORMAL HIGH (ref 65–99)

## 2017-08-10 LAB — CBC
HCT: 28.3 % — ABNORMAL LOW (ref 39.0–52.0)
Hemoglobin: 9.3 g/dL — ABNORMAL LOW (ref 13.0–17.0)
MCH: 27.8 pg (ref 26.0–34.0)
MCHC: 32.9 g/dL (ref 30.0–36.0)
MCV: 84.7 fL (ref 78.0–100.0)
PLATELETS: 224 10*3/uL (ref 150–400)
RBC: 3.34 MIL/uL — ABNORMAL LOW (ref 4.22–5.81)
RDW: 16.7 % — AB (ref 11.5–15.5)
WBC: 9.1 10*3/uL (ref 4.0–10.5)

## 2017-08-10 LAB — PARATHYROID HORMONE, INTACT (NO CA): PTH: 183 pg/mL — ABNORMAL HIGH (ref 15–65)

## 2017-08-10 MED ORDER — LIDOCAINE-PRILOCAINE 2.5-2.5 % EX CREA: 1.0000 "application " | TOPICAL_CREAM | CUTANEOUS | Status: DC | PRN

## 2017-08-10 MED ORDER — NEPRO/CARBSTEADY PO LIQD
237.0000 mL | Freq: Two times a day (BID) | ORAL | Status: DC
  Administered 2017-08-10 – 2017-08-12 (×3): 237 mL via ORAL
  Filled 2017-08-10 (×7): qty 237

## 2017-08-10 MED ORDER — HEPARIN SODIUM (PORCINE) 1000 UNIT/ML DIALYSIS: 1000.0000 [IU] | INTRAMUSCULAR | Status: DC | PRN

## 2017-08-10 MED ORDER — LIDOCAINE HCL (PF) 1 % IJ SOLN: 5.0000 mL | INTRAMUSCULAR | Status: DC | PRN

## 2017-08-10 MED ORDER — SODIUM CHLORIDE 0.9 % IV SOLN: 100.0000 mL | INTRAVENOUS | Status: DC | PRN

## 2017-08-10 MED ORDER — PENTAFLUOROPROP-TETRAFLUOROETH EX AERO: 1.0000 "application " | INHALATION_SPRAY | CUTANEOUS | Status: DC | PRN

## 2017-08-10 MED ORDER — ALTEPLASE 2 MG IJ SOLR: 2.0000 mg | Freq: Once | INTRAMUSCULAR | Status: DC | PRN

## 2017-08-10 NOTE — Anesthesia Postprocedure Evaluation (Signed)
Anesthesia Post Note  Patient: [facility]ana  Procedure(s) Performed: LEFT RADIOCEPHALIC ARTERIOVENOUS (AV) FISTULA CREATION (Left Arm Lower) INSERTION OF DIALYSIS CATHETER RIGHT INTERNAL JUGULAR (Right Chest)     Patient location during evaluation: PACU Anesthesia Type: MAC Level of consciousness: awake and alert Pain management: pain level controlled Vital Signs Assessment: post-procedure vital signs reviewed and stable Respiratory status: spontaneous breathing Cardiovascular status: stable Anesthetic complications: no    Last Vitals:  Vitals:   08/10/17 1223 08/10/17 1601  BP: (!) 184/89 (!) 146/74  Pulse: 85 72  Resp: 18 18  Temp: 36.6 C 36.9 C  SpO2:  98%    Last Pain:  Vitals:   08/10/17 1601  TempSrc: Oral  PainSc:    Pain Goal: Patients Stated Pain Goal: 0 (08/10/17 1045)               Nolon Nations

## 2017-08-10 NOTE — Progress Notes (Signed)
[facility] R[facility]ING NOTE   Subjective:    47 y.o.malewith ESRD with long-standing DM, HTN, proteinuria and edema, mild diastolic CHF with EF 62-95%. Renal biopsy in 04/2017 showing advanced diabetic glomerulosclerosis   CKD has progressed to ESRD. He was most recently seen in office this week on 1/2 and CLIP started.   Seem on dialysis no  Complaints      Objective:  Vital signs in last 24 hours:  Temp:  [97 F (36.1 C)-98.6 F (37 C)] 97.9 F (36.6 C) (01/08 0922) Pulse Rate:  [68-95] 85 (01/08 0922) Resp:  [13-23] 18 (01/08 0922) BP: (141-184)/(72-100) 184/89 (01/08 0922) SpO2:  [92 %-100 %] 98 % (01/08 0922) Weight:  [124 lb 5.4 oz (56.4 kg)-126 lb 12.2 oz (57.5 kg)] 124 lb 5.4 oz (56.4 kg) (01/08 0841)  Weight change:  Filed Weights   08/10/17 0556 08/10/17 0841  Weight: 126 lb 12.2 oz (57.5 kg) 124 lb 5.4 oz (56.4 kg)    Intake/Output: I/O last 3 completed shifts: In: 385.3 [P.O.:240; I.V.:135.3; Other:10] Out: 422 [Urine:401; Stool:1; Blood:20]   Intake/Output this shift:  Total I/O In: -  Out: 1000 [Other:1000]  CVS- RRR RS- CTA ABD- BS present soft non-distended EXT- no edema   Basic Metabolic Panel: Recent Labs  Lab 08/08/17 0350 08/08/17 1020 08/09/17 0417 08/09/17 1759 08/10/17 0453  NA 129*  --  134* 137 135  K 4.2  --  4.2 3.9 4.1  CL 97*  --  103 104 103  CO2 18*  --  21* 19* 21*  GLUCOSE 139*  --  171* 116* 156*  BUN 115*  --  117* 112* 110*  CREATININE 9.01*  --  9.21* 9.17* 8.85*  CALCIUM 8.4*  --  8.1* 8.6* 8.1*  PHOS  --  6.8*  --   --   --     Liver Function Tests: Recent Labs  Lab 08/09/17 1759  AST 16  ALT 17  ALKPHOS 119  BILITOT 0.9  PROT 6.9  ALBUMIN 2.5*   Recent Labs  Lab 08/08/17 0350  LIPASE 81*   No results for input(s): AMMONIA in the last 168 hours.  CBC: Recent Labs  Lab 08/04/17 0835 08/08/17 0350 08/09/17 0417 08/09/17 1759 08/10/17 0453  WBC  --  8.1 7.4 7.3 9.1   HGB 10.1* 9.7* 9.0* 11.2* 9.3*  HCT  --  29.4* 27.9* 33.6* 28.3*  MCV  --  84.0 83.8 83.8 84.7  PLT  --  191 210 231 224    Cardiac Enzymes: Recent Labs  Lab 08/08/17 0350 08/08/17 1020 08/08/17 2122  TROPONINI 0.05* 0.08* 0.04*    BNP: Invalid input(s): POCBNP  CBG: Recent Labs  Lab 08/09/17 1145 08/09/17 1418 08/09/17 1650 08/10/17 0211 08/10/17 0916  GLUCAP 97 109* 104* 135* 63    Microbiology: Results for orders placed or performed during the hospital encounter of 08/08/17  Respiratory Panel by PCR     Status: Abnormal   Collection Time: 08/08/17  1:21 PM  Result Value Ref Range Status   Adenovirus NOT DETECTED NOT DETECTED Final   Coronavirus 229E NOT DETECTED NOT DETECTED Final   Coronavirus HKU1 NOT DETECTED NOT DETECTED Final   Coronavirus NL63 NOT DETECTED NOT DETECTED Final   Coronavirus OC43 NOT DETECTED NOT DETECTED Final   Metapneumovirus NOT DETECTED NOT DETECTED Final   Rhinovirus / Enterovirus NOT DETECTED NOT DETECTED Final   Influenza A NOT DETECTED NOT DETECTED Final   Influenza B NOT DETECTED  NOT DETECTED Final   Parainfluenza Virus 1 NOT DETECTED NOT DETECTED Final   Parainfluenza Virus 2 NOT DETECTED NOT DETECTED Final   Parainfluenza Virus 3 NOT DETECTED NOT DETECTED Final   Parainfluenza Virus 4 NOT DETECTED NOT DETECTED Final   Respiratory Syncytial Virus DETECTED (A) NOT DETECTED Final    Comment: CRITICAL RESULT CALLED TO, READ BACK BY AND VERIFIED WITH: KKURTLAND @1750  08/08/17 BY LHOWARD    Bordetella pertussis NOT DETECTED NOT DETECTED Final   Chlamydophila pneumoniae NOT DETECTED NOT DETECTED Final   Mycoplasma pneumoniae NOT DETECTED NOT DETECTED Final    Coagulation Studies: Recent Labs    08/09/17 1641  LABPROT 14.1  INR 1.10    Urinalysis: Recent Labs    08/08/17 0354  COLORURINE YELLOW  LABSPEC 1.015  PHURINE 5.0  GLUCOSEU 150*  HGBUR MODERATE*  BILIRUBINUR NEGATIVE  KETONESUR NEGATIVE  PROTEINUR >=300*   NITRITE NEGATIVE  LEUKOCYTESUR NEGATIVE      Imaging: Dg Chest Port 1 View  Result Date: 08/09/2017 CLINICAL DATA:  Evaluate dialysis catheter placement EXAM: PORTABLE CHEST 1 VIEW COMPARISON:  08/08/2017 FINDINGS: Right jugular dialysis catheter has been placed. Catheter tip is near the superior cavoatrial junction. Heart size is mildly enlarged but stable. Lungs are clear without pulmonary edema. Negative for a pneumothorax. IMPRESSION: Placement of right jugular dialysis catheter. No complicating features. Electronically Signed   By: Markus Daft M.D.   On: 08/09/2017 17:01   Dg Fluoro Guide Cv Line-no Report  Result Date: 08/09/2017 Fluoroscopy was utilized by the requesting physician.  No radiographic interpretation.     Medications:   . sodium chloride    . sodium chloride    . sodium chloride 10 mL/hr at 08/09/17 1828  . ferric gluconate (FERRLECIT/NULECIT) IV     . amLODipine  5 mg Oral Daily  . atorvastatin  40 mg Oral Daily  . docusate sodium  100 mg Oral BID  . glipiZIDE  5 mg Oral BID AC  . heparin  5,000 Units Subcutaneous Q8H  . insulin aspart  0-9 Units Subcutaneous TID WC  . metoprolol tartrate  25 mg Oral BID  . multivitamin  1 tablet Oral QHS  . ondansetron (ZOFRAN) IV  4 mg Intravenous Once  . pantoprazole  40 mg Oral Daily  . potassium chloride  20-40 mEq Oral Once  . sevelamer carbonate  1,600 mg Oral TID WC   sodium chloride, sodium chloride, alteplase, alum & mag hydroxide-simeth, bisacodyl, guaiFENesin-dextromethorphan, heparin, hydrALAZINE, labetalol, lidocaine (PF), lidocaine-prilocaine, metoprolol tartrate, morphine injection, ondansetron, oxyCODONE, pentafluoroprop-tetrafluoroeth, phenol, senna-docusate  Assessment/ Plan:  1. ESRD 2/2 diabetic nephropathy. Seen in office last week with CLIP started. Now admitted with volume overload and uremia. Appears to be doing well on dialysis   2. Hypertension/volume - BP elevated with volume. Should improve  with HD, follow. Current meds amlodipine, furosemide 3. Anemia - Hgb 9.7. Procrit dosed 1/2. Tsat 14% Ferritin 233 on 07/13/17 , recheck and replete Fe as needed 4. Metabolic bone disease - Phos elevated. Add Renvela binder.  PTH 183  5. Nutrition - Renal/carb mod diet/ Add Renal vitamin  6. DM - per primary 7. Access   Fistula placed  1/7       LOS: 2 [facility] W @TODAY @10 :33 AM

## 2017-08-10 NOTE — Progress Notes (Signed)
  Postoperative hemodialysis access     Date of Surgery:  08/09/17 Surgeon: Early  Subjective:  C/o pain at incision site.  Denies pain in his hand.   PHYSICAL EXAMINATION:  Vitals:   08/10/17 0841 08/10/17 0922  BP: (!) 176/100 (!) 184/89  Pulse: 80 85  Resp: 18 18  Temp: 97.6 F (36.4 C) 97.9 F (36.6 C)  SpO2: 98% 98%    Incision is clean and dry  Sensation in digits is intact;  There is  Thrill  There is bruit. The graft/fistula is palpable    ASSESSMENT/PLAN:  Blake Santana is a 47 y.o. year old male who is s/p left radial cephalic AVF and right TDC placement  -graft/fistula is patent -pt does not have evidence of steal sx -f/u with Dr. Donnetta Hutching in 4-6 weeks to check maturation of AVF -will sign off-call as needed.   Leontine Locket, PA-C [facility] [phone]

## 2017-08-10 NOTE — Progress Notes (Signed)
   Subjective:  No acute events overnight. Patient denies shortness of breath. Continues to have a cough, but improved from presentation. Denies abdominal pain, N/V. Discussed plan for HD today. Patient voiced understanding. All questions answered.   Objective:  Vital signs in last 24 hours: Vitals:   08/09/17 1710 08/09/17 1717 08/09/17 1739 08/10/17 0603  BP:   (!) 174/91 (!) 168/72  Pulse: 93  93 89  Resp: 15  16 16   Temp:  (!) 97 F (36.1 C) (!) 97.4 F (36.3 C) 97.9 F (36.6 C)  TempSrc:   Oral Oral  SpO2: 95%  97% 98%   General: pleasant male, thin, well-developed, lying in bed in no acute distress  Cardiac: regular rate and rhythm, nl S1/S2, no murmurs, rubs or gallops Chest: HD catheter site appears well and without signs of infection  Pulm: diffuse crackles, no increased work of breathing on room air  Abd: soft, NTND, bowel sounds present Neuro: A&Ox3, no focal deficits noted  Ext: warm and well perfused, trace pedal edema stable from yesterday    Assessment/Plan:  Blake Santana is a 47 yo male with history of ESRD not on HD, T2DM, and HTN who presents to the ED with flu like symptoms and volume overload in the setting of ESRD.   # RSV pneumonitis: Patient presented with flulike symptoms that have now improved with supportive care.  He denies chest pain, shortness of breath, and cough this morning.  Respiratory status remained stable.  He is oxygenating well on room air.  Continues to have diffuse crackles on exam.  Plan for dialysis today.  - Continue to monitor respiratory status  - Continue droplet precautions  -Supportive care    # ESRD:Follows up with Dr. Justin Mend.  Hypervolemic on exam. Plan to start HD today. RIJ an AVF  placed 1/7.  - Nephrology following, appreciate recommendations  - Adding Renvela for elevated phos   # Troponinemia:Troponin peaked at 0.08.  He denies ischemic symptoms.  Likely secondary to ESRD. We will continue to monitor for  ischemic symptoms, but low suspicion for cardiac etiology at this time.  - On telemetry  # Anemia of CKD: Baseline Hgb 8-10, currently 9.7. On EPO.  - Continue to monitor  # T2DM, non-insulin dependent: A1c 6.9 on 12/20 (could be falsely low in the setting of ESRD). On glipizide 5 mg BID.Metformin discontinued in the setting of ESRD.  - S- SSI + CBG monitoring - Continue home Glipizide 5 mg BID   # TTS:[id number], sBP 150-170s. On amlodipine, metoprolol and Lasix at home.  - Continue home amlodipine 5 mg -HD today    Dispo: Anticipated discharge in approximately 2-3 day(s).   Welford Roche, MD 08/10/2017, 7:10 AM Pager: [phone]

## 2017-08-11 ENCOUNTER — Encounter (HOSPITAL_COMMUNITY): Payer: Self-pay | Admitting: Vascular Surgery

## 2017-08-11 ENCOUNTER — Telehealth: Payer: Self-pay | Admitting: Internal Medicine

## 2017-08-11 DIAGNOSIS — Z992 Dependence on renal dialysis: Secondary | ICD-10-CM

## 2017-08-11 DIAGNOSIS — N186 End stage renal disease: Secondary | ICD-10-CM

## 2017-08-11 DIAGNOSIS — Z79899 Other long term (current) drug therapy: Secondary | ICD-10-CM

## 2017-08-11 DIAGNOSIS — E440 Moderate protein-calorie malnutrition: Secondary | ICD-10-CM

## 2017-08-11 DIAGNOSIS — R0989 Other specified symptoms and signs involving the circulatory and respiratory systems: Secondary | ICD-10-CM

## 2017-08-11 DIAGNOSIS — I120 Hypertensive chronic kidney disease with stage 5 chronic kidney disease or end stage renal disease: Secondary | ICD-10-CM

## 2017-08-11 DIAGNOSIS — E46 Unspecified protein-calorie malnutrition: Secondary | ICD-10-CM

## 2017-08-11 DIAGNOSIS — Z7984 Long term (current) use of oral hypoglycemic drugs: Secondary | ICD-10-CM

## 2017-08-11 DIAGNOSIS — J189 Pneumonia, unspecified organism: Secondary | ICD-10-CM

## 2017-08-11 DIAGNOSIS — E8770 Fluid overload, unspecified: Secondary | ICD-10-CM

## 2017-08-11 DIAGNOSIS — E1122 Type 2 diabetes mellitus with diabetic chronic kidney disease: Secondary | ICD-10-CM

## 2017-08-11 DIAGNOSIS — D631 Anemia in chronic kidney disease: Secondary | ICD-10-CM

## 2017-08-11 LAB — BASIC METABOLIC PANEL
ANION GAP: 9 (ref 5–15)
BUN: 65 mg/dL — ABNORMAL HIGH (ref 6–20)
CALCIUM: 8 mg/dL — AB (ref 8.9–10.3)
CHLORIDE: 101 mmol/L (ref 101–111)
CO2: 26 mmol/L (ref 22–32)
Creatinine, Ser: 6.43 mg/dL — ABNORMAL HIGH (ref 0.61–1.24)
GFR calc non Af Amer: 9 mL/min — ABNORMAL LOW (ref >60)
GFR, EST AFRICAN AMERICAN: 11 mL/min — AB (ref >60)
Glucose, Bld: 82 mg/dL (ref 65–99)
POTASSIUM: 3.7 mmol/L (ref 3.5–5.1)
Sodium: 136 mmol/L (ref 135–145)

## 2017-08-11 LAB — CBC
HEMATOCRIT: 28.9 % — AB (ref 39.0–52.0)
HEMOGLOBIN: 9.2 g/dL — AB (ref 13.0–17.0)
MCH: 26.9 pg (ref 26.0–34.0)
MCHC: 31.8 g/dL (ref 30.0–36.0)
MCV: 84.5 fL (ref 78.0–100.0)
Platelets: 212 10*3/uL (ref 150–400)
RBC: 3.42 MIL/uL — AB (ref 4.22–5.81)
RDW: 16.4 % — AB (ref 11.5–15.5)
WBC: 7.6 10*3/uL (ref 4.0–10.5)

## 2017-08-11 LAB — GLUCOSE, CAPILLARY
GLUCOSE-CAPILLARY: 108 mg/dL — AB (ref 65–99)
GLUCOSE-CAPILLARY: 82 mg/dL (ref 65–99)
Glucose-Capillary: 72 mg/dL (ref 65–99)
Glucose-Capillary: 156 mg/dL — ABNORMAL HIGH (ref 65–99)
Glucose-Capillary: 87 mg/dL (ref 65–99)

## 2017-08-11 LAB — HEPATITIS B CORE ANTIBODY, TOTAL: HEP B C TOTAL AB: NEGATIVE

## 2017-08-11 LAB — HEPATITIS B SURFACE ANTIBODY, QUANTITATIVE: Hepatitis B-Post: 4.8 m[IU]/mL — ABNORMAL LOW (ref >9.9)

## 2017-08-11 LAB — HEPATITIS B SURFACE ANTIGEN: Hepatitis B Surface Ag: NEGATIVE

## 2017-08-11 LAB — PARATHYROID HORMONE, INTACT (NO CA): PTH: 180 pg/mL — ABNORMAL HIGH (ref 15–65)

## 2017-08-11 NOTE — Progress Notes (Signed)
Initial Nutrition Assessment  DOCUMENTATION CODES:   Non-severe (moderate) malnutrition in context of chronic illness  INTERVENTION:   Continue Nepro Shake po BID, each supplement provides 425 kcal and 19 grams protein  NUTRITION DIAGNOSIS:   Moderate Malnutrition related to chronic illness(ESRD) as evidenced by moderate fat depletion, moderate muscle depletion, severe muscle depletion  GOAL:   Patient will meet greater than or equal to 90% of their needs  MONITOR:   PO intake, Supplement acceptance, Weight trends, I & O's, Labs  REASON FOR ASSESSMENT:   Malnutrition Screening Tool    ASSESSMENT:   Pt with PMH of DM, HTN, newly diagnosed ESRD  presents with RSV   Discussed pt with RN. RN reports pt consumed 100% of breakfast this morning. Pt s/p fistula placement 01/07. First HD 01/08, tolerated well.   Spoke with pt using Spanish interpreting services. Pt reports a poor appetite PTA for 2-3 weeks. Endorses a recent increased appetite. Pt's lunch tray at bedside 90% complete. RD briefly discussed the renal/carb modified diet. Pt would benefit from full diet education, however, not appropriate at this time.   Pt unsure of weight changes because of fluid fluctuations. Unable to use recorded weight readings to identify malnutrition.  Pt reports no current N/V, states the last time he vomited was 2 days ago. Pt reports occasional diarrhea at baseline. Pt amenable to continued nutrition supplementation stating he is consuming all Nepro at this time.  Labs reviewed; CBG 72-195, Albumin 2.5, Hemoglobin 9.2 Medications reviewed; Colace, sliding scale insulin, Glipizide, Rena-vit, Protonix, Zofran, potassium chloride, Renvela  NUTRITION - FOCUSED PHYSICAL EXAM:    Most Recent Value  Orbital Region  Mild depletion  Upper Arm Region  Moderate depletion  Thoracic and Lumbar Region  Mild depletion  Buccal Region  Moderate depletion  Temple Region  Moderate depletion  Clavicle  Bone Region  Moderate depletion  Clavicle and Acromion Bone Region  Severe depletion  Scapular Bone Region  Unable to assess  Dorsal Hand  Moderate depletion  Patellar Region  Severe depletion  Anterior Thigh Region  Severe depletion  Posterior Calf Region  Severe depletion  Edema (RD Assessment)  None     Diet Order:  Diet renal/carb modified with fluid restriction Diet-HS Snack? Nothing; Fluid restriction: 1500 mL Fluid; Room service appropriate? Yes; Fluid consistency: Thin  EDUCATION NEEDS:   Not appropriate for education at this time  Skin:  Skin Assessment: Skin Integrity Issues: Skin Integrity Issues:: Incisions Incisions: arm and chest  Last BM:  01/06  Height:   Ht Readings from Last 1 Encounters:  08/10/17 5\' 6"  (1.676 m)   Weight:   Wt Readings from Last 1 Encounters:  08/10/17 124 lb 5.4 oz (56.4 kg)   Ideal Body Weight:  64.5 kg  BMI:  Body mass index is 20.07 kg/m.  Estimated Nutritional Needs:   Kcal:  1850-2050  Protein:  85-95 grams  Fluid:  < 1.5 L/d  Parks Ranger, MS, RDN, LDN 08/11/2017 4:21 PM

## 2017-08-11 NOTE — Telephone Encounter (Signed)
error 

## 2017-08-11 NOTE — Progress Notes (Signed)
   Subjective:  No acute events overnight. Went for HD yesterday and tolerated it well. Continues to complain of productive cough, but improving. Discuss need for ongoing HD and CLIP process prior to discharge. Patient voiced understanding and is in agreement with plan. All questions answered.   Objective:  Vital signs in last 24 hours: Vitals:   08/10/17 1223 08/10/17 1601 08/10/17 2056 08/11/17 0422  BP: (!) 184/89 (!) 146/74 135/64 128/60  Pulse: 85 72 67 63  Resp: 18 18 17 16   Temp: 97.9 F (36.6 C) 98.4 F (36.9 C) 98.1 F (36.7 C) 98.2 F (36.8 C)  TempSrc: Oral Oral Oral Oral  SpO2:  98% 93% 94%  Weight: 124 lb 5.4 oz (56.4 kg)  124 lb 5.4 oz (56.4 kg)   Height: 5\' 6"  (1.676 m)      General: very pleasant male, thin, well-developed, sleeping in bed in no acute distress  Cardiac: regular rate and rhythm, nl S1/S2, no murmurs, rubs or gallops  Pulm: diffuse crackles, no increased work of breathing  Abd: soft, NTND, bowel sounds present Neuro: A&Ox3,no focal deficits noted  Ext: warm and well perfused, no peripheral edema   Assessment/Plan:  Merel Santoli is a 47 yo male with history of ESRD not on HD, T2DM, and HTN who presents to the ED with flu like symptomsand volume overload in the setting of ESRD.   # ESRD:Follows up with Dr. Justin Mend. Appears more euvolemic on exam. RIJ an AVF  placed 1/7.HD#1 1/9.  - Nephrology following, appreciate recommendations - Pending CLIP process   #RSV pneumonitis:Patient presented with flulike symptoms that have now improved with supportive care.Respiratory status remained stable. Crackles on exams but oxygenating well on room air.   - Continue to monitor respiratory status  - Continue droplet precautions  -Supportive care   # Anemia of CKD: Baseline Hgb 8-10, currently 9.7. On EPO.  - Continue to monitor  # T2DM, non-insulin dependent: A1c 6.9 on 12/20 (could be falsely low in the setting of ESRD). On glipizide 5 mg  BID.Metformin discontinued in the setting of ESRD.  - S- SSI + CBG monitoring - Continue home glipizide 5 mg BID   # HTN: Normotensive after HD yesterday.  - Continue home amlodipine 5 mg    Dispo: Anticipated discharge in approximately 2-5 day(s) pending CLIP process.   Welford Roche, MD 08/11/2017, 6:45 AM Pager: [phone]

## 2017-08-11 NOTE — Progress Notes (Signed)
[facility] ROUNDING NOTE   Subjective:    47 y.o.malewith ESRD with long-standing DM, HTN, proteinuria and edema, mild diastolic CHF with EF 10-25%. Renal biopsy in 04/2017 showing advanced diabetic glomerulosclerosis   CKD has progressed to ESRD. He was most recently seen in office this week on 1/2 and CLIP started.     no  Complaints  Started dialysis     Objective:  Vital signs in last 24 hours:  Temp:  [97.9 F (36.6 C)-98.4 F (36.9 C)] 98.1 F (36.7 C) (01/09 0750) Pulse Rate:  [63-85] 77 (01/09 0750) Resp:  [16-18] 16 (01/09 0750) BP: (128-184)/(60-89) 158/74 (01/09 0750) SpO2:  [93 %-98 %] 98 % (01/09 0750) Weight:  [124 lb 5.4 oz (56.4 kg)] 124 lb 5.4 oz (56.4 kg) (01/08 2056)  Weight change: -6.8 oz (-1.1 kg) Filed Weights   08/10/17 0841 08/10/17 1223 08/10/17 2056  Weight: 124 lb 5.4 oz (56.4 kg) 124 lb 5.4 oz (56.4 kg) 124 lb 5.4 oz (56.4 kg)    Intake/Output: I/O last 3 completed shifts: In: 985.3 [P.O.:840; I.V.:35.3; IV Piggyback:110] Out: 1002 [Urine:1; Other:1000; Stool:1]   Intake/Output this shift:  No intake/output data recorded.  CVS- RRR RS- CTA ABD- BS present soft non-distended EXT- no edema   Basic Metabolic Panel: Recent Labs  Lab 08/08/17 0350 08/08/17 1020 08/09/17 0417 08/09/17 1759 08/10/17 0453 08/11/17 0624  NA 129*  --  134* 137 135 136  K 4.2  --  4.2 3.9 4.1 3.7  CL 97*  --  103 104 103 101  CO2 18*  --  21* 19* 21* 26  GLUCOSE 139*  --  171* 116* 156* 82  BUN 115*  --  117* 112* 110* 65*  CREATININE 9.01*  --  9.21* 9.17* 8.85* 6.43*  CALCIUM 8.4*  --  8.1* 8.6* 8.1* 8.0*  PHOS  --  6.8*  --   --   --   --     Liver Function Tests: Recent Labs  Lab 08/09/17 1759  AST 16  ALT 17  ALKPHOS 119  BILITOT 0.9  PROT 6.9  ALBUMIN 2.5*   Recent Labs  Lab 08/08/17 0350  LIPASE 81*   No results for input(s): AMMONIA in the last 168 hours.  CBC: Recent Labs  Lab 08/08/17 0350  08/09/17 0417 08/09/17 1759 08/10/17 0453 08/11/17 0624  WBC 8.1 7.4 7.3 9.1 7.6  HGB 9.7* 9.0* 11.2* 9.3* 9.2*  HCT 29.4* 27.9* 33.6* 28.3* 28.9*  MCV 84.0 83.8 83.8 84.7 84.5  PLT 191 210 231 224 212    Cardiac Enzymes: Recent Labs  Lab 08/08/17 0350 08/08/17 1020 08/08/17 2122  TROPONINI 0.05* 0.08* 0.04*    BNP: Invalid input(s): POCBNP  CBG: Recent Labs  Lab 08/10/17 0916 08/10/17 1136 08/10/17 1637 08/10/17 2050 08/11/17 0748  GLUCAP 82 111* 195* 139* 4    Microbiology: Results for orders placed or performed during the hospital encounter of 08/08/17  Respiratory Panel by PCR     Status: Abnormal   Collection Time: 08/08/17  1:21 PM  Result Value Ref Range Status   Adenovirus NOT DETECTED NOT DETECTED Final   Coronavirus 229E NOT DETECTED NOT DETECTED Final   Coronavirus HKU1 NOT DETECTED NOT DETECTED Final   Coronavirus NL63 NOT DETECTED NOT DETECTED Final   Coronavirus OC43 NOT DETECTED NOT DETECTED Final   Metapneumovirus NOT DETECTED NOT DETECTED Final   Rhinovirus / Enterovirus NOT DETECTED NOT DETECTED Final   Influenza A NOT DETECTED  NOT DETECTED Final   Influenza B NOT DETECTED NOT DETECTED Final   Parainfluenza Virus 1 NOT DETECTED NOT DETECTED Final   Parainfluenza Virus 2 NOT DETECTED NOT DETECTED Final   Parainfluenza Virus 3 NOT DETECTED NOT DETECTED Final   Parainfluenza Virus 4 NOT DETECTED NOT DETECTED Final   Respiratory Syncytial Virus DETECTED (A) NOT DETECTED Final    Comment: CRITICAL RESULT CALLED TO, READ BACK BY AND VERIFIED WITH: KKURTLAND @1750  08/08/17 BY LHOWARD    Bordetella pertussis NOT DETECTED NOT DETECTED Final   Chlamydophila pneumoniae NOT DETECTED NOT DETECTED Final   Mycoplasma pneumoniae NOT DETECTED NOT DETECTED Final    Coagulation Studies: Recent Labs    08/09/17 1641  LABPROT 14.1  INR 1.10    Urinalysis: No results for input(s): COLORURINE, LABSPEC, PHURINE, GLUCOSEU, HGBUR, BILIRUBINUR, KETONESUR,  PROTEINUR, UROBILINOGEN, NITRITE, LEUKOCYTESUR in the last 72 hours.  Invalid input(s): APPERANCEUR    Imaging: Dg Chest Port 1 View  Result Date: 08/09/2017 CLINICAL DATA:  Evaluate dialysis catheter placement EXAM: PORTABLE CHEST 1 VIEW COMPARISON:  08/08/2017 FINDINGS: Right jugular dialysis catheter has been placed. Catheter tip is near the superior cavoatrial junction. Heart size is mildly enlarged but stable. Lungs are clear without pulmonary edema. Negative for a pneumothorax. IMPRESSION: Placement of right jugular dialysis catheter. No complicating features. Electronically Signed   By: Markus Daft M.D.   On: 08/09/2017 17:01   Dg Fluoro Guide Cv Line-no Report  Result Date: 08/09/2017 Fluoroscopy was utilized by the requesting physician.  No radiographic interpretation.     Medications:   . sodium chloride    . sodium chloride    . sodium chloride 10 mL/hr at 08/09/17 1828  . ferric gluconate (FERRLECIT/NULECIT) IV Stopped (08/10/17 1418)   . amLODipine  5 mg Oral Daily  . atorvastatin  40 mg Oral Daily  . docusate sodium  100 mg Oral BID  . feeding supplement (NEPRO CARB STEADY)  237 mL Oral BID BM  . glipiZIDE  5 mg Oral BID AC  . heparin  5,000 Units Subcutaneous Q8H  . insulin aspart  0-9 Units Subcutaneous TID WC  . metoprolol tartrate  25 mg Oral BID  . multivitamin  1 tablet Oral QHS  . ondansetron (ZOFRAN) IV  4 mg Intravenous Once  . pantoprazole  40 mg Oral Daily  . potassium chloride  20-40 mEq Oral Once  . sevelamer carbonate  1,600 mg Oral TID WC   sodium chloride, sodium chloride, alteplase, alum & mag hydroxide-simeth, bisacodyl, guaiFENesin-dextromethorphan, heparin, hydrALAZINE, labetalol, lidocaine (PF), lidocaine-prilocaine, metoprolol tartrate, morphine injection, ondansetron, oxyCODONE, pentafluoroprop-tetrafluoroeth, phenol, senna-docusate  Assessment/ Plan:  1. ESRD 2/2 diabetic nephropathy. Seen in office last week with CLIP started. Now admitted  with volume overload and uremia. Appears to be doing well on dialysis   2. Hypertension/volume - BP elevated with volume. Should improve with HD, follow. Current meds amlodipine, furosemide 3. Anemia - Hgb 9.7. Procrit dosed 1/2. Tsat 14% Ferritin 233 on 07/13/17 ,  replete Fe as needed 4. Metabolic bone disease - Phos elevated. Add Renvela binder.  PTH 183  5. Nutrition - Renal/carb mod diet/ Add Renal vitamin  6. DM - per primary 7. Access   Fistula placed  1/7       LOS: 3 Karolyne Timmons W @TODAY @10 :28 AM

## 2017-08-12 ENCOUNTER — Encounter: Payer: Self-pay | Admitting: Vascular Surgery

## 2017-08-12 LAB — BASIC METABOLIC PANEL
ANION GAP: 10 (ref 5–15)
BUN: 73 mg/dL — ABNORMAL HIGH (ref 6–20)
CHLORIDE: 101 mmol/L (ref 101–111)
CO2: 23 mmol/L (ref 22–32)
Calcium: 8.2 mg/dL — ABNORMAL LOW (ref 8.9–10.3)
Creatinine, Ser: 6.84 mg/dL — ABNORMAL HIGH (ref 0.61–1.24)
GFR calc non Af Amer: 9 mL/min — ABNORMAL LOW (ref >60)
GFR, EST AFRICAN AMERICAN: 10 mL/min — AB (ref >60)
Glucose, Bld: 98 mg/dL (ref 65–99)
Potassium: 3.8 mmol/L (ref 3.5–5.1)
Sodium: 134 mmol/L — ABNORMAL LOW (ref 135–145)

## 2017-08-12 LAB — GLUCOSE, CAPILLARY
GLUCOSE-CAPILLARY: 194 mg/dL — AB (ref 65–99)
Glucose-Capillary: 71 mg/dL (ref 65–99)
Glucose-Capillary: 190 mg/dL — ABNORMAL HIGH (ref 65–99)

## 2017-08-12 MED ORDER — DARBEPOETIN ALFA 60 MCG/0.3ML IJ SOSY
60.0000 ug | PREFILLED_SYRINGE | INTRAMUSCULAR | Status: DC
  Filled 2017-08-12: qty 0.3

## 2017-08-12 NOTE — Progress Notes (Signed)
Accepted at [facility] [address] .1st treatment Friday January 11,2019 at 10:45am .Schedule and chairtime Monday,Wednesday,Friday at 12:00pm

## 2017-08-12 NOTE — Care Management Note (Addendum)
Case Management Note  Patient Details  Name: Blake Santana MRN: [id number] Date of Birth: 10/04/1970  Subjective/Objective:                    Action/Plan:  PTA from home with wife.  Pt is new to HD - per hematology note CLIP process is complete.  CM contacted attending to request discharge consideration as initial outpt HD session is scheduled for tomorrow.  Pt is active with [facility] - discharge appt already made and documented on AVS.     Expected Discharge Date:                  Expected Discharge Plan:  Home/Self Care  In-House Referral:     Discharge planning Services  CM Consult  Post Acute Care Choice:    Choice offered to:     DME Arranged:    DME Agency:     HH Arranged:    HH Agency:     Status of Service:     If discussed at [employer], dates discussed:    Additional Comments: Update Pt will discharge home today in care of wife.  Per attending pt will not discharge on any new medications - CM confirmed both the last fill date and that pt pays cash for prescriptions.   Last fill dates are as follows: Amlodipine 12/28 for 30 days supply Lasix 12/4 for 30 day supply Glucotrol 10/12 with 90 day supply Zaroxolyn 12/12 for 30 day supply Lopressor 12/4  For 30 day supply  Both Lasix and Lopressor are only $4 - CM confirmed that pt has routinely filled prescriptions at cost.  MATCH deemed unneccessary.   Maryclare Labrador, RN 08/12/2017, 1:58 PM

## 2017-08-12 NOTE — Progress Notes (Signed)
   Subjective:  No acute events overnight.  Continues to do well.  Reports no complaints this morning.  Continue to wait for CLIP process to be complete.  Objective:  Vital signs in last 24 hours: Vitals:   08/11/17 1110 08/11/17 1606 08/11/17 2051 08/12/17 0538  BP: (!) 141/66 (!) 147/74 (!) 159/76 (!) 142/61  Pulse: 70 71 67 75  Resp:  18 19 20   Temp:  97.9 F (36.6 C) 98 F (36.7 C) 98.2 F (36.8 C)  TempSrc:  Oral Oral Oral  SpO2:  97% 100% 100%  Weight:   124 lb 1.9 oz (56.3 kg)   Height:       General: Very pleasant male, thin, well-developed, sitting up in bed in no acute distress Neck: RIJ in place without signs of infection noted  Cardiac: regular rate and rhythm, nl S1/S2, no murmurs, rubs or gallops  Pulm: crackles at the bases, no increased work of breathing on room air  Abd: soft, NTND, bowel sounds present Neuro: A&Ox3, no focal deficits noted  Ext: warm and well perfused, no peripheral edema noted, AVF with good blood flow    Assessment/Plan:  Blake Santana is a 47 yo male with history of ESRD not on HD, T2DM, and HTN who presents to the ED with flu like symptomsand volume overload in the setting of ESRD.  # ESRD:Follows up with Dr. Justin Mend. RIJan AVFplaced 1/7. Tolerating HD, next session today. - Nephrology following, appreciate recommendations - Pending CLIP process   #RSV pneumonitis:Patient presented with flulike symptoms that have now improved with supportive care.Respiratory status remaines stable. Crackles on exams but oxygenating well on room air.   - Continue to monitor respiratory status  -Supportive care   # Anemia of CKD: Baseline Hgb 8-10, currently 9.7. On EPO.  - Continue to monitor  # T2DM, non-insulin dependent: A1c 6.9 on 12/20 (could be falsely low in the setting of ESRD). On glipizide 5 mg BID.Metformin discontinued in the setting of ESRD.  - S- SSI + CBG monitoring - Continue home glipizide 5 mg BID  # HTN:  Normotensive after HD yesterday.  - Continue home amlodipine 5 mg   Dispo: Anticipated discharge in approximately 2-4 day(s) pending completion of CLIP process.   Welford Roche, MD 08/12/2017, 7:05 AM Pager: [phone]

## 2017-08-12 NOTE — Discharge Summary (Signed)
Name: Blake Santana MRN: [id number] DOB: March 01, 1971 47 y.o. PCP: Mack Hook, MD  Date of Admission: 08/08/2017  6:25 AM Date of Discharge: 08/12/2017 Attending Physician: Annia Belt, MD  Discharge Diagnosis: 1. ESRD 2. RSV   Principal Problem:   RSV (respiratory syncytial virus infection) Active Problems:   Uncontrolled type 2 diabetes mellitus with complication, without long-term current use of insulin (HCC)   Edema   Anemia of chronic kidney failure   Hypertension   ESRD (end stage renal disease) (HCC)   Malnutrition of moderate degree   Discharge Medications: Allergies as of 08/12/2017   No Known Allergies     Medication List    STOP taking these medications   furosemide 80 MG tablet Commonly known as:  LASIX   metolazone 2.5 MG tablet Commonly known as:  ZAROXOLYN     TAKE these medications   AGAMATRIX PRESTO w/Device Kit Check sugars twice daily   amLODipine 5 MG tablet Commonly known as:  NORVASC Take 1 tablet (5 mg total) by mouth daily.   atorvastatin 40 MG tablet Commonly known as:  LIPITOR 1 tab by mouth daily with evening meal What changed:    how much to take  how to take this  when to take this  additional instructions   glipiZIDE 5 MG tablet Commonly known as:  GLUCOTROL 2 tabs by mouth in the morning with breakfast and 1 tab by mouth with evening meal. What changed:    how much to take  how to take this  when to take this  additional instructions   glucose blood test strip Commonly known as:  AGAMATRIX PRESTO TEST Check sugars twice daily   metoprolol tartrate 25 MG tablet Commonly known as:  LOPRESSOR Take 1 tablet (25 mg total) by mouth 2 (two) times daily.       Disposition and follow-up:   Blake Santana was discharged from [facility] in Stable condition.  At the hospital follow up visit please address:  1.  Please assess for ongoing symptoms of respiratory  infection and volume status. Please ensure patient has been compliant with hemodialysis schedule on TTHS.   2.  Labs / imaging needed at time of follow-up: None   3.  Pending labs/ test needing follow-up: None   Follow-up Appointments: Follow-up Information    Mack Hook, MD Follow up.   Specialty:  Internal Medicine Why:  Lorayne Marek cita con su doctora primaria para una cita de seguimiento.  Contact information: [place] [address] [phone]           Hospital Course by problem list: Principal Problem:   RSV (respiratory syncytial virus infection) Active Problems:   Uncontrolled type 2 diabetes mellitus with complication, without long-term current use of insulin (HCC)   Edema   Anemia of chronic kidney failure   Hypertension   ESRD (end stage renal disease) (HCC)   Malnutrition of moderate degree   1. ESRD: Patient presented uremic and volume overloaded. He had a RIJ and an L AVF placed on 1/7 and started HD on 1/8. He tolerated 2 HD sessions well on this admission and was clipped to [facility]. He is scheduled for T, TH, and S at noon. He was discharged in stable condition. He is a patient of Dr. Justin Mend and will continue to follow up with him.   2. RSV: Patient presented with flu-like symptoms for 2-3 days. He had diffuse crackles on exam,  but no new oxygen requirements. RVP positive for RSV and Flu negative. He was treated with supportive care and never required supplemental oxygenation. He was discharged home in good condition.   Discharge Vitals:   BP (!) 192/88 (BP Location: Right Arm)   Pulse 82   Temp 98.2 F (36.8 C) (Oral)   Resp 18   Ht '5\' 6"'  (1.676 m)   Wt 125 lb 14.1 oz (57.1 kg) Comment: standing weight  SpO2 100%   BMI 20.32 kg/m   Pertinent Labs, Studies, and Procedures:   CBC Latest Ref Rng & Units 08/11/2017 08/10/2017 08/09/2017  WBC 4.0 - 10.5 K/uL 7.6 9.1 7.3  Hemoglobin 13.0 - 17.0 g/dL 9.2(L) 9.3(L) 11.2(L)    Hematocrit 39.0 - 52.0 % 28.9(L) 28.3(L) 33.6(L)  Platelets 150 - 400 K/uL 212 224 231    BMP Latest Ref Rng & Units 08/12/2017 08/11/2017 08/10/2017  Glucose 65 - 99 mg/dL 98 82 156(H)  BUN 6 - 20 mg/dL 73(H) 65(H) 110(H)  Creatinine 0.61 - 1.24 mg/dL 6.84(H) 6.43(H) 8.85(H)  BUN/Creat Ratio 9 - 20 - - -  Sodium 135 - 145 mmol/L 134(L) 136 135  Potassium 3.5 - 5.1 mmol/L 3.8 3.7 4.1  Chloride 101 - 111 mmol/L 101 101 103  CO2 22 - 32 mmol/L 23 26 21(L)  Calcium 8.9 - 10.3 mg/dL 8.2(L) 8.0(L) 8.1(L)   CXR 08/08/2017: IMPRESSION: Cardiomegaly with vascular congestion. Peribronchial cuffing suspicious for pulmonary edema. Increased fluid in the fissures. Findings suspicious for fluid overload/CHF.    Discharge Instructions: Discharge Instructions    Diet - low sodium heart healthy   Complete by:  As directed    Diet Carb Modified   Complete by:  As directed    Discharge instructions   Complete by:  As directed    Va a comenzar a ir a un centro para su dialisis. El centro se llama [employer]. La direccion es [address] [place]. Su horario de dialisis va a ser [place], jueves y sabado. Su primera sesion es este Sabado a las 12 del mediodia. El Dr. Justin Mend lo va a continuar viendo. Por favor, haga una cita de seguimiento con su doctora regular para asegurarse que todo esta bien un vez lo demos de alta del hospital. Llamenos si tiene alguna pregunta.   Increase activity slowly   Complete by:  As directed       Signed: Welford Roche, MD 08/12/2017, 4:54 PM   Pager: [phone]

## 2017-08-12 NOTE — Progress Notes (Signed)
[facility] ROUNDING NOTE   Subjective:    47 y.o.malewith ESRD with long-standing DM, HTN, proteinuria and edema, mild diastolic CHF with EF 49-44%. Renal biopsy in 04/2017 showing advanced diabetic glomerulosclerosis   CKD has progressed to ESRD. He was most recently seen in office this week on 1/2 and CLIP started.   Dialysis ordered for today      Objective:  Vital signs in last 24 hours:  Temp:  [97.7 F (36.5 C)-98.2 F (36.8 C)] 97.7 F (36.5 C) (01/10 0745) Pulse Rate:  [67-75] 68 (01/10 0745) Resp:  [18-20] 20 (01/10 0745) BP: (141-163)/(61-79) 163/79 (01/10 0745) SpO2:  [97 %-100 %] 100 % (01/10 0745) Weight:  [124 lb 1.9 oz (56.3 kg)] 124 lb 1.9 oz (56.3 kg) (01/09 2051)  Weight change: -3.5 oz (-0.1 kg) Filed Weights   08/10/17 1223 08/10/17 2056 08/11/17 2051  Weight: 124 lb 5.4 oz (56.4 kg) 124 lb 5.4 oz (56.4 kg) 124 lb 1.9 oz (56.3 kg)    Intake/Output: I/O last 3 completed shifts: In: 1237 [P.O.:780; NG/GT:237; IV Piggyback:220] Out: 0    Intake/Output this shift:  No intake/output data recorded.  CVS- RRR RS- CTA ABD- BS present soft non-distended EXT- no edema   Basic Metabolic Panel: Recent Labs  Lab 08/08/17 1020 08/09/17 0417 08/09/17 1759 08/10/17 0453 08/11/17 0624 08/12/17 0802  NA  --  134* 137 135 136 134*  K  --  4.2 3.9 4.1 3.7 3.8  CL  --  103 104 103 101 101  CO2  --  21* 19* 21* 26 23  GLUCOSE  --  171* 116* 156* 82 98  BUN  --  117* 112* 110* 65* 73*  CREATININE  --  9.21* 9.17* 8.85* 6.43* 6.84*  CALCIUM  --  8.1* 8.6* 8.1* 8.0* 8.2*  PHOS 6.8*  --   --   --   --   --     Liver Function Tests: Recent Labs  Lab 08/09/17 1759  AST 16  ALT 17  ALKPHOS 119  BILITOT 0.9  PROT 6.9  ALBUMIN 2.5*   Recent Labs  Lab 08/08/17 0350  LIPASE 81*   No results for input(s): AMMONIA in the last 168 hours.  CBC: Recent Labs  Lab 08/08/17 0350 08/09/17 0417 08/09/17 1759 08/10/17 0453  08/11/17 0624  WBC 8.1 7.4 7.3 9.1 7.6  HGB 9.7* 9.0* 11.2* 9.3* 9.2*  HCT 29.4* 27.9* 33.6* 28.3* 28.9*  MCV 84.0 83.8 83.8 84.7 84.5  PLT 191 210 231 224 212    Cardiac Enzymes: Recent Labs  Lab 08/08/17 0350 08/08/17 1020 08/08/17 2122  TROPONINI 0.05* 0.08* 0.04*    BNP: Invalid input(s): POCBNP  CBG: Recent Labs  Lab 08/11/17 1223 08/11/17 1717 08/11/17 2045 08/11/17 2339 08/12/17 0742  GLUCAP 156* 108* 87 82 71    Microbiology: Results for orders placed or performed during the hospital encounter of 08/08/17  Respiratory Panel by PCR     Status: Abnormal   Collection Time: 08/08/17  1:21 PM  Result Value Ref Range Status   Adenovirus NOT DETECTED NOT DETECTED Final   Coronavirus 229E NOT DETECTED NOT DETECTED Final   Coronavirus HKU1 NOT DETECTED NOT DETECTED Final   Coronavirus NL63 NOT DETECTED NOT DETECTED Final   Coronavirus OC43 NOT DETECTED NOT DETECTED Final   Metapneumovirus NOT DETECTED NOT DETECTED Final   Rhinovirus / Enterovirus NOT DETECTED NOT DETECTED Final   Influenza A NOT DETECTED NOT DETECTED Final  Influenza B NOT DETECTED NOT DETECTED Final   Parainfluenza Virus 1 NOT DETECTED NOT DETECTED Final   Parainfluenza Virus 2 NOT DETECTED NOT DETECTED Final   Parainfluenza Virus 3 NOT DETECTED NOT DETECTED Final   Parainfluenza Virus 4 NOT DETECTED NOT DETECTED Final   Respiratory Syncytial Virus DETECTED (A) NOT DETECTED Final    Comment: CRITICAL RESULT CALLED TO, READ BACK BY AND VERIFIED WITH: KKURTLAND @1750  08/08/17 BY LHOWARD    Bordetella pertussis NOT DETECTED NOT DETECTED Final   Chlamydophila pneumoniae NOT DETECTED NOT DETECTED Final   Mycoplasma pneumoniae NOT DETECTED NOT DETECTED Final    Coagulation Studies: Recent Labs    08/09/17 1641  LABPROT 14.1  INR 1.10    Urinalysis: No results for input(s): COLORURINE, LABSPEC, PHURINE, GLUCOSEU, HGBUR, BILIRUBINUR, KETONESUR, PROTEINUR, UROBILINOGEN, NITRITE, LEUKOCYTESUR  in the last 72 hours.  Invalid input(s): APPERANCEUR    Imaging: No results found.   Medications:   . sodium chloride    . sodium chloride    . sodium chloride 10 mL/hr at 08/09/17 1828  . ferric gluconate (FERRLECIT/NULECIT) IV Stopped (08/11/17 1410)   . amLODipine  5 mg Oral Daily  . atorvastatin  40 mg Oral Daily  . darbepoetin (ARANESP) injection - DIALYSIS  60 mcg Intravenous Q Thu-HD  . docusate sodium  100 mg Oral BID  . feeding supplement (NEPRO CARB STEADY)  237 mL Oral BID BM  . glipiZIDE  5 mg Oral BID AC  . heparin  5,000 Units Subcutaneous Q8H  . insulin aspart  0-9 Units Subcutaneous TID WC  . metoprolol tartrate  25 mg Oral BID  . multivitamin  1 tablet Oral QHS  . ondansetron (ZOFRAN) IV  4 mg Intravenous Once  . pantoprazole  40 mg Oral Daily  . potassium chloride  20-40 mEq Oral Once  . sevelamer carbonate  1,600 mg Oral TID WC   sodium chloride, sodium chloride, alteplase, alum & mag hydroxide-simeth, bisacodyl, guaiFENesin-dextromethorphan, heparin, hydrALAZINE, labetalol, lidocaine (PF), lidocaine-prilocaine, metoprolol tartrate, morphine injection, ondansetron, oxyCODONE, pentafluoroprop-tetrafluoroeth, phenol, senna-docusate  Assessment/ Plan:  1. ESRD 2/2 diabetic nephropathy. Seen in office last week with CLIP started. Now admitted with volume overload and uremia. Appears to be doing well on dialysis  Has another treatment planned today 2. Hypertension/volume - BP elevated with volume. Should improve with HD, follow. Current meds amlodipine, furosemide 3. Anemia - Hgb 9.7. Procrit dosed 1/2. Tsat 14% Ferritin 233 on 07/13/17 ,  replete Fe as needed 4. Metabolic bone disease - Phos elevated. Add Renvela binder.  PTH 183  5. Nutrition - Renal/carb mod diet/ Add Renal vitamin  6. DM - per primary 7. Access   Fistula placed  1/7       LOS: 4 Blake Santana @TODAY @10 :00 AM

## 2017-08-13 DIAGNOSIS — I509 Heart failure, unspecified: Secondary | ICD-10-CM | POA: Insufficient documentation

## 2017-08-13 DIAGNOSIS — E785 Hyperlipidemia, unspecified: Secondary | ICD-10-CM | POA: Diagnosis present

## 2017-08-13 DIAGNOSIS — Z1159 Encounter for screening for other viral diseases: Secondary | ICD-10-CM | POA: Insufficient documentation

## 2017-08-13 DIAGNOSIS — D689 Coagulation defect, unspecified: Secondary | ICD-10-CM | POA: Insufficient documentation

## 2017-08-13 DIAGNOSIS — N2581 Secondary hyperparathyroidism of renal origin: Secondary | ICD-10-CM | POA: Insufficient documentation

## 2017-08-18 DIAGNOSIS — R0602 Shortness of breath: Secondary | ICD-10-CM | POA: Insufficient documentation

## 2017-08-18 DIAGNOSIS — T829XXA Unspecified complication of cardiac and vascular prosthetic device, implant and graft, initial encounter: Secondary | ICD-10-CM | POA: Insufficient documentation

## 2017-08-31 DIAGNOSIS — E875 Hyperkalemia: Secondary | ICD-10-CM | POA: Insufficient documentation

## 2017-09-03 ENCOUNTER — Other Ambulatory Visit: Payer: Self-pay

## 2017-09-07 DIAGNOSIS — Z2839 Other underimmunization status: Secondary | ICD-10-CM | POA: Insufficient documentation

## 2017-09-15 ENCOUNTER — Other Ambulatory Visit: Payer: Self-pay

## 2017-09-15 DIAGNOSIS — Z48812 Encounter for surgical aftercare following surgery on the circulatory system: Secondary | ICD-10-CM

## 2017-09-15 DIAGNOSIS — N186 End stage renal disease: Secondary | ICD-10-CM

## 2017-09-21 DIAGNOSIS — Z992 Dependence on renal dialysis: Secondary | ICD-10-CM | POA: Insufficient documentation

## 2017-09-30 ENCOUNTER — Ambulatory Visit: Payer: Self-pay | Admitting: Internal Medicine

## 2017-10-19 ENCOUNTER — Encounter: Payer: Self-pay | Admitting: Vascular Surgery

## 2017-10-19 ENCOUNTER — Ambulatory Visit (HOSPITAL_COMMUNITY)
Admission: RE | Admit: 2017-10-19 | Discharge: 2017-10-19 | Disposition: A | Discharge status: Home / Self Care | Payer: Self-pay | Source: Ambulatory Visit | Attending: Vascular Surgery | Admitting: Vascular Surgery

## 2017-10-19 ENCOUNTER — Ambulatory Visit (INDEPENDENT_AMBULATORY_CARE_PROVIDER_SITE_OTHER): Payer: Self-pay | Admitting: Vascular Surgery

## 2017-10-19 ENCOUNTER — Other Ambulatory Visit: Payer: Self-pay

## 2017-10-19 VITALS — BP 175/95 | HR 84 | Temp 98.4°F | Resp 20 | Ht 66.0 in | Wt 130.0 lb

## 2017-10-19 DIAGNOSIS — N186 End stage renal disease: Secondary | ICD-10-CM | POA: Insufficient documentation

## 2017-10-19 DIAGNOSIS — Z48812 Encounter for surgical aftercare following surgery on the circulatory system: Secondary | ICD-10-CM | POA: Insufficient documentation

## 2017-10-19 NOTE — Progress Notes (Signed)
   Patient name: Blake Santana MRN: [id number] DOB: 1971/07/08 Sex: male  REASON FOR VISIT: Follow-up after radiocephalic AV fistula creation on 08/09/2017  HPI: Blake Santana is a 47 y.o. male here today for follow-up.  He is with his family and Spanish interpreter.  They report no difficulty with his catheter.  He has had a good healing of his radial cephalic incision.  Current Outpatient Medications  Medication Sig Dispense Refill  . amLODipine (NORVASC) 5 MG tablet Take 1 tablet (5 mg total) by mouth daily. 30 tablet 11  . atorvastatin (LIPITOR) 40 MG tablet 1 tab by mouth daily with evening meal (Patient taking differently: Take 40 mg by mouth daily. ) 30 tablet 11  . Blood Glucose Monitoring Suppl (AGAMATRIX PRESTO) w/Device KIT Check sugars twice daily 1 kit 0  . glipiZIDE (GLUCOTROL) 5 MG tablet 2 tabs by mouth in the morning with breakfast and 1 tab by mouth with evening meal. (Patient taking differently: Take 5 mg by mouth 2 (two) times daily before a meal. ) 60 tablet 11  . glucose blood (AGAMATRIX PRESTO TEST) test strip Check sugars twice daily 100 each 12  . metoprolol tartrate (LOPRESSOR) 25 MG tablet Take 1 tablet (25 mg total) by mouth 2 (two) times daily. 60 tablet 11   No current facility-administered medications for this visit.      PHYSICAL EXAM: Vitals:   10/19/17 0832 10/19/17 0833  BP: (!) 178/97 (!) 175/95  Pulse: 84   Resp: 20   Temp: 98.4 F (36.9 C)   TempSrc: Oral   SpO2: 99%   Weight: 130 lb (59 kg)   Height: '5\' 6"'$  (1.676 m)     GENERAL: The patient is a well-nourished male, in no acute distress. The vital signs are documented above. No evidence of infection around the catheter site. Thrill in his cephalic fistula.  He has very nice maturation of his vein from the wrist to the antecubital and actually has enlargement of his cephalic vein above the antecubital space on his upper arm.  MEDICAL ISSUES: Able  overall.  Would recommend a total of 3 months before using his fistula.  This should be available for use in Blake Santana April.  Would use this for several sessions and then remove catheter.  We are available for catheter removal at that time.   [name], MD FACS [facility] Tel [phone] Pager [phone]

## 2017-10-22 ENCOUNTER — Encounter: Payer: Self-pay | Admitting: Internal Medicine

## 2017-10-22 ENCOUNTER — Ambulatory Visit: Payer: Self-pay | Admitting: Internal Medicine

## 2017-10-22 VITALS — BP 180/90 | HR 78 | Resp 12 | Ht 63.0 in | Wt 127.0 lb

## 2017-10-22 DIAGNOSIS — Z79899 Other long term (current) drug therapy: Secondary | ICD-10-CM

## 2017-10-22 DIAGNOSIS — IMO0002 Reserved for concepts with insufficient information to code with codable children: Secondary | ICD-10-CM

## 2017-10-22 DIAGNOSIS — E118 Type 2 diabetes mellitus with unspecified complications: Secondary | ICD-10-CM

## 2017-10-22 DIAGNOSIS — N185 Chronic kidney disease, stage 5: Secondary | ICD-10-CM

## 2017-10-22 DIAGNOSIS — E785 Hyperlipidemia, unspecified: Secondary | ICD-10-CM

## 2017-10-22 DIAGNOSIS — E1165 Type 2 diabetes mellitus with hyperglycemia: Secondary | ICD-10-CM

## 2017-10-22 DIAGNOSIS — I10 Essential (primary) hypertension: Secondary | ICD-10-CM

## 2017-10-22 MED ORDER — ATORVASTATIN CALCIUM 40 MG PO TABS: ORAL_TABLET | ORAL | 11 refills | Status: DC

## 2017-10-22 MED ORDER — METOPROLOL TARTRATE 25 MG PO TABS: ORAL_TABLET | ORAL | 11 refills | Status: DC

## 2017-10-22 MED ORDER — GLIPIZIDE 5 MG PO TABS: ORAL_TABLET | ORAL | 11 refills | Status: DC

## 2017-10-22 MED ORDER — AMLODIPINE BESYLATE 5 MG PO TABS: ORAL_TABLET | ORAL | 11 refills | Status: DC

## 2017-10-22 MED ORDER — CALCIUM ACETATE 667 MG PO CAPS: ORAL_CAPSULE | ORAL | Status: DC

## 2017-10-22 MED ORDER — TERBINAFINE HCL 250 MG PO TABS: 250.0000 mg | ORAL_TABLET | Freq: Every day | ORAL | 0 refills | Status: DC

## 2017-10-22 NOTE — Patient Instructions (Signed)
For foot and toenail fungus:  Spray your shoes with Lysol or Lotrimin Antifungal spray when you start the medication for your feet.   Re-spray your the shoes you wear with each wearing and allow to dry before wearing again You will need to continue to spray the shoes you have during the infection of your toenails and feet have been thrown out due to wear.  Once your toenails and feet are clear of infection, the shoes you buy new do not necessarily need to be sprayed. Clean your shower floor once to twice daily with bleach containing cleaner. 

## 2017-10-22 NOTE — Progress Notes (Signed)
   Subjective:    Patient ID: Blake Santana, male    DOB: [birth year], 47 y.o.   MRN: [id number]  HPI   1.  DM:  Taking Glipizide 5 mg twice daily with meals.  Appears he is missing medication as he has a number of pills left in bottle that should have been completed around the 13th of March. Directions they show me from Renal are a bit contradictory with written directions stating twice daily dosing of some meds, but check marks showing only once daily.  Regardless, does not appear he is taking any of his meds appropriately.  2.  Essential Hypertension:  Not taking Amlodipine.  Has not filled since January 13th.  So likely without for about 1.5 months.  He did not have refills beyond January, but did not call to get those filled  3.  Fingernail and Toenail onychomycosis:  No improvement with topical Terbinafine.  Picking at his toenails  4.  Hyperlipidemia:  Again, many pills left from a January fill that should have been completed mid Feb.  Current Meds  Medication Sig  . atorvastatin (LIPITOR) 40 MG tablet 1 tab by mouth daily with evening meal (Patient taking differently: Take 40 mg by mouth daily. )  . Blood Glucose Monitoring Suppl (AGAMATRIX PRESTO) w/Device KIT Check sugars twice daily  . glipiZIDE (GLUCOTROL) 5 MG tablet 1 tab by mouth with meals twice daily  . glucose blood (AGAMATRIX PRESTO TEST) test strip Check sugars twice daily  . metoprolol tartrate (LOPRESSOR) 25 MG tablet Take 1 tablet (25 mg total) by mouth 2 (two) times daily.  . [DISCONTINUED] glipiZIDE (GLUCOTROL) 5 MG tablet 2 tabs by mouth in the morning with breakfast and 1 tab by mouth with evening meal. (Patient taking differently: Take 5 mg by mouth 2 (two) times daily before a meal. )      Review of Systems     Objective:   Physical Exam   CBG:  362 at 3:20 p.m. NAD Lungs:  CTA CV:  RRR  LE:  Trace edema Extrems: Thickened white and yellow deformed fingernails on left hand and both great toenails  as most affected.  Significant skin involvement with flaking about great toenails       Assessment & Plan:  1.  DM:  Once again, not taking meds as recommended and not notifying clinic if difficulty getting meds.  Long discussion with patient and wife about need to avoid further DM and hypertensive complications by taking meds regularly. Tried to send meds with Spanish directions for taking, but [employer] would not allow.  Wrote out on discharge papers the Spanish directions.  Have done this before however and not clear this is the problem with his ability to be compliant. He is to take Glipizide twice daily  2.  Hypertension:  To take Amlodipine 5 mg once daily and metoprolol 12.5 mg twice daily.  3.  Hyperlipidemia:  Atorvastatin '40mg'$  daily with evening meal.  4.  Renal failure:  phoslo 2 caps 3 times daily with meals.  5. Finger and toenail onychomycosis:  Terbinafine 250 mg daily.  Follow up for hepatic profile and with me in 6 and 12 weeks.

## 2017-10-23 LAB — COMPREHENSIVE METABOLIC PANEL
ALK PHOS: 146 IU/L — AB (ref 39–117)
ALT: 15 IU/L (ref 0–44)
AST: 19 IU/L (ref 0–40)
Albumin/Globulin Ratio: 1.1 — ABNORMAL LOW (ref 1.2–2.2)
Albumin: 3.9 g/dL (ref 3.5–5.5)
BILIRUBIN TOTAL: 0.2 mg/dL (ref 0.0–1.2)
BUN/Creatinine Ratio: 6 — ABNORMAL LOW (ref 9–20)
BUN: 41 mg/dL — ABNORMAL HIGH (ref 6–24)
CHLORIDE: 94 mmol/L — AB (ref 96–106)
CO2: 26 mmol/L (ref 20–29)
Calcium: 9.4 mg/dL (ref 8.7–10.2)
Creatinine, Ser: 6.33 mg/dL — ABNORMAL HIGH (ref 0.76–1.27)
GFR calc Af Amer: 11 mL/min/{1.73_m2} — ABNORMAL LOW (ref >59)
GFR calc non Af Amer: 9 mL/min/{1.73_m2} — ABNORMAL LOW (ref >59)
GLOBULIN, TOTAL: 3.7 g/dL (ref 1.5–4.5)
Glucose: 307 mg/dL — ABNORMAL HIGH (ref 65–99)
Potassium: 5.2 mmol/L (ref 3.5–5.2)
SODIUM: 139 mmol/L (ref 134–144)
Total Protein: 7.6 g/dL (ref 6.0–8.5)

## 2017-11-30 ENCOUNTER — Encounter: Payer: Self-pay | Admitting: Nephrology

## 2017-12-02 ENCOUNTER — Other Ambulatory Visit: Payer: Self-pay | Admitting: *Deleted

## 2017-12-02 ENCOUNTER — Encounter: Payer: Self-pay | Admitting: *Deleted

## 2017-12-02 ENCOUNTER — Ambulatory Visit: Payer: Self-pay | Admitting: Internal Medicine

## 2017-12-02 ENCOUNTER — Encounter: Payer: Self-pay | Admitting: Internal Medicine

## 2017-12-02 VITALS — BP 180/100 | HR 70 | Resp 12 | Ht 63.0 in | Wt 132.0 lb

## 2017-12-02 DIAGNOSIS — E1165 Type 2 diabetes mellitus with hyperglycemia: Secondary | ICD-10-CM

## 2017-12-02 DIAGNOSIS — Z79899 Other long term (current) drug therapy: Secondary | ICD-10-CM

## 2017-12-02 DIAGNOSIS — B351 Tinea unguium: Secondary | ICD-10-CM

## 2017-12-02 DIAGNOSIS — IMO0002 Reserved for concepts with insufficient information to code with codable children: Secondary | ICD-10-CM

## 2017-12-02 DIAGNOSIS — E118 Type 2 diabetes mellitus with unspecified complications: Secondary | ICD-10-CM

## 2017-12-02 NOTE — Progress Notes (Signed)
Letter of pre-procedure instructions for patient for [facility] removal faxed to [facility] at [facility].Marland Kitchen

## 2017-12-02 NOTE — Progress Notes (Signed)
   Subjective:    Patient ID: Blake Santana, male    DOB: 08-22-1970, 47 y.o.   MRN: [id number]  HPI   Onychomycosis:  Was reportedly told by dialysis that the Terbinafine was causing problems with his kidneys.  Not clear if he means his liver.   Has taken Terbinafine for 6 weeks.  Causing him to have diarrhea as well.  States has had the latter for weeks.  He stopped the Terbinafine yesterday due to above.  Sugars running 90 to low 200s, mainly in low to mid 100s in the morning.  Not checking in the afternoon  Review of Systems     Objective:   Physical Exam  NAD HEENT:  No scleral icterus Lungs:  CTA CV:  RRR without murmur or rub. Abd:  S, NT, No HSM or mass. + BS LE: No edema. All of nails are growing out with normal nail at the base.      Assessment & Plan:  1.  Toenail onychomycosis:  Responding to treatment, but only halfway through.  Patient states he was told the Terbinafine was causing problems with his renal function, which does not sound correct. Suspect had  Transaminase elevation. Attempting to call the Dialysis center where he gets treatment. They do not have the phone number on them. Message left. Hold on Terbinafine for now.  CMP. May need to decrease dose, if not stop completely

## 2017-12-03 LAB — COMPREHENSIVE METABOLIC PANEL
ALK PHOS: 131 IU/L — AB (ref 39–117)
ALT: 14 IU/L (ref 0–44)
AST: 13 IU/L (ref 0–40)
Albumin/Globulin Ratio: 1.1 — ABNORMAL LOW (ref 1.2–2.2)
Albumin: 4.1 g/dL (ref 3.5–5.5)
BUN / CREAT RATIO: 7 — AB (ref 9–20)
BUN: 59 mg/dL — AB (ref 6–24)
Bilirubin Total: 0.3 mg/dL (ref 0.0–1.2)
CO2: 21 mmol/L (ref 20–29)
CREATININE: 8.41 mg/dL — AB (ref 0.76–1.27)
Calcium: 9.4 mg/dL (ref 8.7–10.2)
Chloride: 96 mmol/L (ref 96–106)
GFR calc non Af Amer: 7 mL/min/{1.73_m2} — ABNORMAL LOW (ref >59)
GFR, EST AFRICAN AMERICAN: 8 mL/min/{1.73_m2} — AB (ref >59)
GLOBULIN, TOTAL: 3.6 g/dL (ref 1.5–4.5)
Glucose: 124 mg/dL — ABNORMAL HIGH (ref 65–99)
Potassium: 5.2 mmol/L (ref 3.5–5.2)
SODIUM: 139 mmol/L (ref 134–144)
Total Protein: 7.7 g/dL (ref 6.0–8.5)

## 2017-12-06 ENCOUNTER — Telehealth: Payer: Self-pay | Admitting: Internal Medicine

## 2017-12-07 ENCOUNTER — Telehealth: Payer: Self-pay | Admitting: *Deleted

## 2017-12-07 NOTE — Telephone Encounter (Signed)
Confirmed with BRITTANY @ [facility]. They gave procedure instructions for [facility] removal to patient.

## 2017-12-08 ENCOUNTER — Encounter: Payer: Self-pay | Admitting: Internal Medicine

## 2017-12-08 ENCOUNTER — Telehealth: Payer: Self-pay | Admitting: Internal Medicine

## 2017-12-08 NOTE — Telephone Encounter (Signed)
Called [facility] attempt #3.  No pick up from nurse's station.  Unable to leave voice mail with ward clerk as voicemail full. Called back later:  Spoke with nurse:  Nephrologist wrote order regarding Terbinafine to stop Dr Arty Baumgartner  [phone].  Amalia Hailey

## 2017-12-08 NOTE — Telephone Encounter (Signed)
Spoke with Dr. Arty Baumgartner:  He wanted to look at Terbinafine and see if would be okay to prescribe a reduced dose.  Shared that when I looked the med up, did not recommend a reduction in dose with ESRD.   He will get back to me

## 2017-12-09 ENCOUNTER — Ambulatory Visit: Payer: Self-pay | Admitting: Internal Medicine

## 2017-12-21 ENCOUNTER — Other Ambulatory Visit: Payer: Self-pay | Admitting: Internal Medicine

## 2017-12-24 ENCOUNTER — Encounter (HOSPITAL_COMMUNITY): Payer: Self-pay

## 2017-12-28 ENCOUNTER — Other Ambulatory Visit (HOSPITAL_COMMUNITY): Payer: Self-pay | Admitting: Nephrology

## 2017-12-28 DIAGNOSIS — N186 End stage renal disease: Secondary | ICD-10-CM

## 2018-01-07 ENCOUNTER — Ambulatory Visit (HOSPITAL_COMMUNITY)
Admission: RE | Admit: 2018-01-07 | Discharge: 2018-01-07 | Disposition: A | Discharge status: Home / Self Care | Payer: Self-pay | Source: Ambulatory Visit | Attending: Nephrology | Admitting: Nephrology

## 2018-01-10 ENCOUNTER — Ambulatory Visit (HOSPITAL_COMMUNITY)
Admission: RE | Admit: 2018-01-10 | Discharge: 2018-01-10 | Disposition: A | Discharge status: Home / Self Care | Payer: Self-pay | Source: Ambulatory Visit | Attending: Nephrology | Admitting: Nephrology

## 2018-01-10 ENCOUNTER — Encounter (HOSPITAL_COMMUNITY): Payer: Self-pay

## 2018-01-10 DIAGNOSIS — N186 End stage renal disease: Secondary | ICD-10-CM

## 2018-01-10 MED ORDER — LIDOCAINE-EPINEPHRINE (PF) 1 %-1:200000 IJ SOLN
INTRAMUSCULAR | Status: AC
  Filled 2018-01-10: qty 30

## 2018-01-10 MED ORDER — CHLORHEXIDINE GLUCONATE 4 % EX LIQD
CUTANEOUS | Status: AC
  Filled 2018-01-10: qty 15

## 2018-01-11 ENCOUNTER — Other Ambulatory Visit: Payer: Self-pay | Admitting: *Deleted

## 2018-01-11 ENCOUNTER — Encounter: Payer: Self-pay | Admitting: *Deleted

## 2018-01-11 ENCOUNTER — Telehealth: Payer: Self-pay | Admitting: *Deleted

## 2018-01-11 NOTE — Progress Notes (Signed)
Fistulogram requested by Dr. Marval Regal [facility] due to "difficulty cannulation and issues with low access flow per Margreta Journey at [facility].

## 2018-01-11 NOTE — Telephone Encounter (Signed)
Received fax per Sharyn Lull at [facility]

## 2018-01-11 NOTE — Progress Notes (Signed)
Letter of pre-procedure instructions faxed to [facility] at [facility] To review and give to patient.

## 2018-01-13 ENCOUNTER — Telehealth: Payer: Self-pay | Admitting: Internal Medicine

## 2018-01-13 ENCOUNTER — Ambulatory Visit: Payer: Self-pay | Admitting: Internal Medicine

## 2018-01-13 NOTE — Telephone Encounter (Signed)
Letter for daughter, Lora Paula for unsatisfactory school performance with [facility] [employer]

## 2018-01-14 ENCOUNTER — Ambulatory Visit: Payer: Self-pay | Admitting: Internal Medicine

## 2018-01-14 ENCOUNTER — Encounter: Payer: Self-pay | Admitting: Internal Medicine

## 2018-01-14 VITALS — BP 170/100 | HR 86 | Resp 12 | Ht 63.0 in | Wt 133.0 lb

## 2018-01-14 DIAGNOSIS — E785 Hyperlipidemia, unspecified: Secondary | ICD-10-CM

## 2018-01-14 DIAGNOSIS — B351 Tinea unguium: Secondary | ICD-10-CM

## 2018-01-14 DIAGNOSIS — I10 Essential (primary) hypertension: Secondary | ICD-10-CM

## 2018-01-14 DIAGNOSIS — R197 Diarrhea, unspecified: Secondary | ICD-10-CM

## 2018-01-14 NOTE — Progress Notes (Signed)
Subjective:    Patient ID: Blake Santana, male    DOB: 1971/03/17, 47 y.o.   MRN: [id number]  HPI   Fingernail and Toenail onychomycosis:   Here for follow up of fingernail and toenail onychomycosis.  Did not hear back from Nephrology regarding whether we could safely cut back on his dosing of Terbinafine for toenail and fingernail onychomycosis.  He and his wife, however, feel very strongly that the affected areas of his toenail and fingernails continues to grow out and improve.  DM:  Only checking sugars in the morning.  Forgets in the evening.  Running in mid 100s to low 200s now, which is a bit high He feels he is eating in a healthy way  Hypertension: states he is taking both his Metoprolol and Amlodipine.  Apparently, has problems with his bp dropping towards end of his each dialysis treatment.  States has been a problem since the beginning.  Seems to be worsening. Has vomiting with these episodes.  He also states when he is at renal office/dialysis, his bp is much lower than here.  Watery stools: :  Has had loose stool every night for the past 3 months.  3 stools nightly--awakens him.   Small to medium watery brown stool.  No blood.  No abdominal pain.   No stools at all during day. He sometimes has a formed stool in the daytime, but never at night.  States has the formed stool 3 times weekly.  Describes eating a regular diet.   He seems to think one of his meds is causing this. The only really new med is the Calcium Acetate gelcaps Later, seems he has more problems in the nights when he had dialysis during the day and napped--may have the loose stools more so then.  These days he has also had nausea and vomiting with low bps during dialysis.    Current Meds  Medication Sig  . amLODipine (NORVASC) 5 MG tablet 1 tab by mouth daily in the morning with breakfast  . atorvastatin (LIPITOR) 40 MG tablet 1 tab by mouth daily with evening meal  . Blood Glucose Monitoring Suppl  (AGAMATRIX PRESTO) w/Device KIT Check sugars twice daily  . calcium acetate (PHOSLO) 667 MG capsule 2 caps by mouth three times daily with meals  . glipiZIDE (GLUCOTROL) 5 MG tablet 1 tab by mouth twice daily with meals  . glucose blood (AGAMATRIX PRESTO TEST) test strip Check sugars twice daily  . metoprolol tartrate (LOPRESSOR) 25 MG tablet 1/2 tab by mouth twice daily with meals    No Known Allergies    Review of Systems     Objective:   Physical Exam  Chronically ill appearing. Lungs:  CTA CV:  RRR without murmur or rub.  Good thrill and bruit of AV fistula, left arm LE:  Trace edema of ankles bilaterally. Fingernails of left hand and toenails with distal aspects involved with discoloration and thickening, esp left great toenail.      Assessment & Plan:  1.  Fingernail and toenail onychomycosis:  Patient is satisfied with how fingernails and toenails appear to be growing out and base of involved nails definitely improved with clearing. Will check with Dr. Arty Baumgartner for terbinafine should need to restart. He is to hold on to the rest of the Terbinafine for now.  2.  Hypotensive episodes with dialysis in patient with currently poorly controlled hypertension here.  Call into Dr. Arty Baumgartner regarding what they are recommending with his blood pressure  control, particularly days of dialysis  3.  Episodes of diarrhea in the evening, particularly related to days of dialysis and perhaps hypotensive episodes.  4.  DM:  Not clear how his DM control is currently as he is not monitoring frequently.   Return for fasting labs in 2 weeks:  A1C  5.  Dyslipidemia:  FLP in 2 weeks.  Follow up in 4 months.

## 2018-01-19 ENCOUNTER — Ambulatory Visit (HOSPITAL_COMMUNITY)
Admission: RE | Admit: 2018-01-19 | Discharge: 2018-01-19 | Disposition: A | Discharge status: Home / Self Care | Payer: Self-pay | Source: Ambulatory Visit | Attending: Vascular Surgery | Admitting: Vascular Surgery

## 2018-01-19 ENCOUNTER — Encounter: Payer: Self-pay | Admitting: *Deleted

## 2018-01-19 ENCOUNTER — Encounter (HOSPITAL_COMMUNITY)
Admission: RE | Disposition: A | Discharge status: Home / Self Care | Payer: Self-pay | Source: Ambulatory Visit | Attending: Vascular Surgery

## 2018-01-19 ENCOUNTER — Other Ambulatory Visit: Payer: Self-pay | Admitting: *Deleted

## 2018-01-19 ENCOUNTER — Encounter (HOSPITAL_COMMUNITY): Payer: Self-pay | Admitting: Vascular Surgery

## 2018-01-19 DIAGNOSIS — T82510A Breakdown (mechanical) of surgically created arteriovenous fistula, initial encounter: Secondary | ICD-10-CM | POA: Insufficient documentation

## 2018-01-19 DIAGNOSIS — Z7984 Long term (current) use of oral hypoglycemic drugs: Secondary | ICD-10-CM | POA: Insufficient documentation

## 2018-01-19 DIAGNOSIS — Y832 Surgical operation with anastomosis, bypass or graft as the cause of abnormal reaction of the patient, or of later complication, without mention of misadventure at the time of the procedure: Secondary | ICD-10-CM | POA: Insufficient documentation

## 2018-01-19 DIAGNOSIS — Z841 Family history of disorders of kidney and ureter: Secondary | ICD-10-CM | POA: Insufficient documentation

## 2018-01-19 DIAGNOSIS — Z79899 Other long term (current) drug therapy: Secondary | ICD-10-CM | POA: Insufficient documentation

## 2018-01-19 DIAGNOSIS — D631 Anemia in chronic kidney disease: Secondary | ICD-10-CM | POA: Insufficient documentation

## 2018-01-19 DIAGNOSIS — N185 Chronic kidney disease, stage 5: Secondary | ICD-10-CM

## 2018-01-19 DIAGNOSIS — N186 End stage renal disease: Secondary | ICD-10-CM | POA: Insufficient documentation

## 2018-01-19 DIAGNOSIS — I120 Hypertensive chronic kidney disease with stage 5 chronic kidney disease or end stage renal disease: Secondary | ICD-10-CM | POA: Insufficient documentation

## 2018-01-19 DIAGNOSIS — Z8249 Family history of ischemic heart disease and other diseases of the circulatory system: Secondary | ICD-10-CM | POA: Insufficient documentation

## 2018-01-19 DIAGNOSIS — E1122 Type 2 diabetes mellitus with diabetic chronic kidney disease: Secondary | ICD-10-CM | POA: Insufficient documentation

## 2018-01-19 DIAGNOSIS — Z992 Dependence on renal dialysis: Secondary | ICD-10-CM | POA: Insufficient documentation

## 2018-01-19 DIAGNOSIS — T82898A Other specified complication of vascular prosthetic devices, implants and grafts, initial encounter: Secondary | ICD-10-CM

## 2018-01-19 DIAGNOSIS — Z9889 Other specified postprocedural states: Secondary | ICD-10-CM | POA: Insufficient documentation

## 2018-01-19 DIAGNOSIS — Z87891 Personal history of nicotine dependence: Secondary | ICD-10-CM | POA: Insufficient documentation

## 2018-01-19 DIAGNOSIS — Z833 Family history of diabetes mellitus: Secondary | ICD-10-CM | POA: Insufficient documentation

## 2018-01-19 HISTORY — PX: A/V FISTULAGRAM: CATH118298

## 2018-01-19 LAB — GLUCOSE, CAPILLARY: Glucose-Capillary: 103 mg/dL — ABNORMAL HIGH (ref 65–99)

## 2018-01-19 LAB — POCT I-STAT, CHEM 8
BUN: 42 mg/dL — AB (ref 6–20)
Calcium, Ion: 1.18 mmol/L (ref 1.15–1.40)
Chloride: 98 mmol/L — ABNORMAL LOW (ref 101–111)
Creatinine, Ser: 7.2 mg/dL — ABNORMAL HIGH (ref 0.61–1.24)
Glucose, Bld: 109 mg/dL — ABNORMAL HIGH (ref 65–99)
HEMATOCRIT: 34 % — AB (ref 39.0–52.0)
HEMOGLOBIN: 11.6 g/dL — AB (ref 13.0–17.0)
Potassium: 4.6 mmol/L (ref 3.5–5.1)
SODIUM: 137 mmol/L (ref 135–145)
TCO2: 29 mmol/L (ref 22–32)

## 2018-01-19 SURGERY — A/V FISTULAGRAM
Anesthesia: LOCAL | Laterality: Left

## 2018-01-19 MED ORDER — HEPARIN (PORCINE) IN NACL 1000-0.9 UT/500ML-% IV SOLN
INTRAVENOUS | Status: AC
  Filled 2018-01-19: qty 500

## 2018-01-19 MED ORDER — IODIXANOL 320 MG/ML IV SOLN
INTRAVENOUS | Status: DC | PRN
  Administered 2018-01-19: 20 mL via INTRAVENOUS

## 2018-01-19 MED ORDER — LIDOCAINE HCL (PF) 1 % IJ SOLN
INTRAMUSCULAR | Status: AC
  Filled 2018-01-19: qty 30

## 2018-01-19 MED ORDER — HEPARIN (PORCINE) IN NACL 2-0.9 UNITS/ML
INTRAMUSCULAR | Status: AC | PRN
  Administered 2018-01-19: 500 mL

## 2018-01-19 MED ORDER — SODIUM CHLORIDE 0.9% FLUSH: 3.0000 mL | INTRAVENOUS | Status: DC | PRN

## 2018-01-19 MED ORDER — LIDOCAINE HCL (PF) 1 % IJ SOLN
INTRAMUSCULAR | Status: DC | PRN
  Administered 2018-01-19: 2 mL

## 2018-01-19 SURGICAL SUPPLY — 10 items
BAG SNAP BAND KOVER 36X36 (MISCELLANEOUS) ×2 IMPLANT
COVER DOME SNAP 22 D (MISCELLANEOUS) ×2 IMPLANT
COVER PRB 48X5XTLSCP FOLD TPE (BAG) ×1 IMPLANT
COVER PROBE 5X48 (BAG) ×2
KIT MICROPUNCTURE NIT STIFF (SHEATH) ×1 IMPLANT
PROTECTION STATION PRESSURIZED (MISCELLANEOUS) ×2
STATION PROTECTION PRESSURIZED (MISCELLANEOUS) ×1 IMPLANT
STOPCOCK MORSE 400PSI 3WAY (MISCELLANEOUS) ×2 IMPLANT
TRAY PV CATH (CUSTOM PROCEDURE TRAY) ×2 IMPLANT
TUBING CIL FLEX 10 FLL-RA (TUBING) ×2 IMPLANT

## 2018-01-19 NOTE — Op Note (Signed)
    Patient name: Blake Santana MRN: [id number] DOB: July 31, 1971 Sex: male  01/19/2018 Pre-operative Diagnosis: End-stage renal disease, malfunction left arm AV fistula Post-operative diagnosis:  Same Surgeon:  Eda Paschal. Donzetta Matters, MD Procedure Performed: 1.  Ultrasound-guided cannulation left arm fistula 2.  Left arm fistulogram  Indications: 46 year old male on dialysis via right IJ tunnel catheter.  He has a left arm AV fistula that they have had difficulty cannulating it.  He is indicated for fistulogram possible intervention.  Findings: Fistula does have strong palpable thrill in the mid forearm area.  Unfortunately with attempting to cannulate it it does roll away from the needle and even with ultrasound this was quite difficult.  Fistulogram is patent and the cephalic vein in the upper arm and feels the subclavian vein essentially is patent as well.  The fistula towards the wrist is quite diminutive.  I discussed with him the need to proceed with conversion to a left upper arm cephalic vein fistula as I do not think this fistula in the forearm will be suitable even with endovascular intervention.   Procedure:  The patient was identified in the holding area and taken to room 8.  The patient was then placed supine on the table and prepped and draped in the usual sterile fashion.  A time out was called.  Ultrasound was used to evaluate the left arm AV fistula and this was cannulated after noted to be patent with a micropuncture needle under direct guidance.  It was actually quite difficult given that the vein wanted to roll away from the needle even with ultrasound guidance.  After I was able to get a wire and then placed a micropuncture sheath and left arm fistulogram was performed with the above findings.  I elected not to intervene discussed with the patient that he will need conversion to a left upper arm AV fistula.  He demonstrates good understanding the interpreter.  We will get him set up  for a nondialysis day in the near future.  The sheath was removed and 4-0 Monocryl was used to close the skin stick site.  He tolerated this procedure well without immediate comp occasion.  Contrast: 20cc    Brandon C. Donzetta Matters, MD [facility] [place] Office: [phone] Pager: [phone]

## 2018-01-19 NOTE — H&P (Signed)
Blake Santana   History of Present Illness: This is a 47 y.o. male on hd via both left arm avf and tdc. He is having issues with fistula. Presents for fistulogram.  Past Medical History:  Diagnosis Date  . Anemia of chronic kidney failure 03/11/2017  . Chronic kidney disease 03/11/2017  . Diabetes mellitus without complication ([place]) 7902  . Edema 01/14/2017  . Hypertension 03/11/2017  . Iron deficiency anemia 06/18/2016  . RSV (respiratory syncytial virus infection) 08/2017    Past Surgical History:  Procedure Laterality Date  . AV FISTULA PLACEMENT Left 08/09/2017   Procedure: LEFT RADIOCEPHALIC ARTERIOVENOUS (AV) FISTULA CREATION;  Surgeon: [name], MD;  Location: [facility];  Service: Vascular;  Laterality: Left;  . INSERTION OF DIALYSIS CATHETER  08/09/2017  . INSERTION OF DIALYSIS CATHETER Right 08/09/2017   Procedure: INSERTION OF DIALYSIS CATHETER RIGHT INTERNAL JUGULAR;  Surgeon: [name], MD;  Location: [facility];  Service: Vascular;  Laterality: Right;    No Known Allergies  Prior to Admission medications   Medication Sig Start Date End Date Taking? Authorizing Provider  amLODipine (NORVASC) 5 MG tablet 1 tab by mouth daily in the morning with breakfast 10/22/17  Yes Mack Hook, MD  atorvastatin (LIPITOR) 40 MG tablet 1 tab by mouth daily with evening meal 10/22/17  Yes Mack Hook, MD  calcium acetate (PHOSLO) 667 MG capsule 2 caps by mouth three times daily with meals 10/22/17  Yes Mack Hook, MD  glipiZIDE (GLUCOTROL) 5 MG tablet 1 tab by mouth twice daily with meals 10/22/17  Yes Mack Hook, MD  lidocaine-prilocaine (EMLA) cream Apply 1 application topically 3 (three) times a week. Tuesday, Thursday and Saturday 11/09/17  Yes [provider]  metoprolol tartrate (LOPRESSOR) 25 MG tablet 1/2 tab by mouth twice daily with meals 10/22/17  Yes Mack Hook, MD  Blood Glucose Monitoring Suppl [facility] PRESTO) w/Device KIT Check sugars twice  daily 06/04/16   Mack Hook, MD  glucose blood (AGAMATRIX PRESTO TEST) test strip Check sugars twice daily 06/04/16   Mack Hook, MD  terbinafine (LAMISIL) 250 MG tablet Take 1 tablet (250 mg total) by mouth daily. Patient not taking: Reported on 12/02/2017 10/22/17   Mack Hook, MD    Social History   Socioeconomic History  . Marital status: Married    Spouse name: [name]  . Number of children: 1  . Years of education: 84  . Highest education level: Not on file  Occupational History  . Occupation: [employer]  . Financial resource strain: Not on file  . Food insecurity:    Worry: Not on file    Inability: Not on file  . Transportation needs:    Medical: Not on file    Non-medical: Not on file  Tobacco Use  . Smoking status: Former Research scientist (life sciences)  . Smokeless tobacco: Never Used  Substance and Sexual Activity  . Alcohol use: No    Comment: Used to drink too much once weekly, but nothing for 1 year.  . Drug use: No  . Sexual activity: Not on file  Lifestyle  . Physical activity:    Days per week: Not on file    Minutes per session: Not on file  . Stress: Not on file  Relationships  . Social connections:    Talks on phone: Not on file    Gets together: Not on file    Attends religious service: Not on file    Active member of club or organization:  Not on file    Attends meetings of clubs or organizations: Not on file    Relationship status: Not on file  . Intimate partner violence:    Fear of current or ex partner: Not on file    Emotionally abused: Not on file    Physically abused: Not on file    Forced sexual activity: Not on file  Other Topics Concern  . Not on file  Social History Narrative   Originally from [place]   Came to [place] [employer]. In [place] with his girlfriend and their son.     Family History  Problem Relation Age of Onset  . Diabetes Mother   . Kidney disease Mother        renal failure--diabetes, cause of death  .  Hypertension Mother   . Diabetes Father   . Kidney disease Father        Cause of death:  kidney failure form diabetes  . Alcohol abuse Father   . Diabetes Sister     ROS: no complaints  Physical Examination  Vitals:   01/19/18 0836  BP: 136/80  Pulse: 77  Temp: 97.9 F (36.6 C)  SpO2: 100%   Body mass index is 23.79 kg/m.  General:   NAD HENT: WNL, normocephalic Pulmonary: normal non-labored breathing Cardiac: palpable left radial pulse Extremities: left arm thrill Neurologic: A&O X 3; Appropriate Affect ; SENSATION: normal; MOTOR FUNCTION:  moving all extremities equally. Speech is fluent/normal   CBC    Component Value Date/Time   WBC 7.6 08/11/2017 0624   RBC 3.42 (L) 08/11/2017 0624   HGB 11.6 (L) 01/19/2018 0908   HGB 8.4 (L) 01/14/2017 1046   HCT 34.0 (L) 01/19/2018 0908   HCT 26.0 (L) 01/14/2017 1046   PLT 212 08/11/2017 0624   PLT 236 01/14/2017 1046   MCV 84.5 08/11/2017 0624   MCV 88 01/14/2017 1046   MCH 26.9 08/11/2017 0624   MCHC 31.8 08/11/2017 0624   RDW 16.4 (H) 08/11/2017 0624   RDW 14.1 01/14/2017 1046   LYMPHSABS 1.8 01/14/2017 1046   EOSABS 0.7 (H) 01/14/2017 1046   BASOSABS 0.0 01/14/2017 1046    BMET    Component Value Date/Time   NA 137 01/19/2018 0908   NA 139 12/02/2017 0939   K 4.6 01/19/2018 0908   CL 98 (L) 01/19/2018 0908   CO2 21 12/02/2017 0939   GLUCOSE 109 (H) 01/19/2018 0908   BUN 42 (H) 01/19/2018 0908   BUN 59 (H) 12/02/2017 0939   CREATININE 7.20 (H) 01/19/2018 0908   CALCIUM 9.4 12/02/2017 0939   GFRNONAA 7 (L) 12/02/2017 0939   GFRAA 8 (L) 12/02/2017 0939    COAGS: Lab Results  Component Value Date   INR 1.10 08/09/2017   INR 1.02 04/07/2017     ASSESSMENT/PLAN: This is a 47 y.o. male is here for left arm fistulogram with possible intervention. Discussed risks and benefits via interpreter.   Tema Alire C. Donzetta Matters, MD [facility] [place] Office: [phone] Pager:  [phone]

## 2018-01-19 NOTE — Discharge Instructions (Signed)

## 2018-01-19 NOTE — Progress Notes (Signed)
Pre-op instruction letter faxed to [facility], To review with patient.

## 2018-01-20 ENCOUNTER — Telehealth: Payer: Self-pay | Admitting: *Deleted

## 2018-01-20 NOTE — Telephone Encounter (Signed)
"  Instructions received per Webb Silversmith at [facility]. Will review and give to patient today".

## 2018-01-28 ENCOUNTER — Other Ambulatory Visit: Payer: Self-pay

## 2018-01-28 DIAGNOSIS — IMO0002 Reserved for concepts with insufficient information to code with codable children: Secondary | ICD-10-CM

## 2018-01-28 DIAGNOSIS — E1165 Type 2 diabetes mellitus with hyperglycemia: Secondary | ICD-10-CM

## 2018-01-28 DIAGNOSIS — E118 Type 2 diabetes mellitus with unspecified complications: Secondary | ICD-10-CM

## 2018-01-28 DIAGNOSIS — E785 Hyperlipidemia, unspecified: Secondary | ICD-10-CM

## 2018-01-29 LAB — LIPID PANEL W/O CHOL/HDL RATIO
CHOLESTEROL TOTAL: 162 mg/dL (ref 100–199)
HDL: 43 mg/dL (ref >39)
LDL CALC: 93 mg/dL (ref 0–99)
TRIGLYCERIDES: 128 mg/dL (ref 0–149)
VLDL CHOLESTEROL CAL: 26 mg/dL (ref 5–40)

## 2018-01-29 LAB — HGB A1C W/O EAG: Hgb A1c MFr Bld: 7.5 % — ABNORMAL HIGH (ref 4.8–5.6)

## 2018-01-31 ENCOUNTER — Other Ambulatory Visit: Payer: Self-pay

## 2018-01-31 ENCOUNTER — Encounter (HOSPITAL_COMMUNITY): Payer: Self-pay | Admitting: *Deleted

## 2018-01-31 NOTE — Progress Notes (Signed)
SDW-pre-op call completed using [facility] # [phone]. Pt denies SOB, chest pain, and being under the care of a cardiologist. Pt denies having a stress test and cardiac cath. Pt made aware to stop taking vitamins, fish oil and herbal medications. Do not take any NSAIDs ie: Ibuprofen, Advil, Naproxen (Aleve), Motrin, BC and Goody Powder.Pt made aware to not take Glipizide the night before surgery and the morning of surgery. Pt made aware to check BG every 2 hours prior to arrival to hospital on DOS. Pt made aware to treat a BG < 70 with 4 ounces of apple juice, wait 15 minutes after intervention to recheck BG, if BG remains < 70, call Short Stay unit to speak with a nurse. Pt verbalized understanding of all pre-op instructions.

## 2018-02-02 ENCOUNTER — Encounter (HOSPITAL_COMMUNITY)
Admission: RE | Disposition: A | Discharge status: Home / Self Care | Payer: Self-pay | Source: Ambulatory Visit | Attending: Vascular Surgery

## 2018-02-02 ENCOUNTER — Ambulatory Visit (HOSPITAL_COMMUNITY): Payer: Self-pay | Admitting: Certified Registered"

## 2018-02-02 ENCOUNTER — Other Ambulatory Visit: Payer: Self-pay

## 2018-02-02 ENCOUNTER — Encounter (HOSPITAL_COMMUNITY): Payer: Self-pay | Admitting: *Deleted

## 2018-02-02 ENCOUNTER — Ambulatory Visit (HOSPITAL_COMMUNITY)
Admission: RE | Admit: 2018-02-02 | Discharge: 2018-02-02 | Disposition: A | Discharge status: Home / Self Care | Payer: Self-pay | Source: Ambulatory Visit | Attending: Vascular Surgery | Admitting: Vascular Surgery

## 2018-02-02 ENCOUNTER — Telehealth: Payer: Self-pay | Admitting: Vascular Surgery

## 2018-02-02 DIAGNOSIS — N186 End stage renal disease: Secondary | ICD-10-CM | POA: Insufficient documentation

## 2018-02-02 DIAGNOSIS — Z7984 Long term (current) use of oral hypoglycemic drugs: Secondary | ICD-10-CM | POA: Insufficient documentation

## 2018-02-02 DIAGNOSIS — T82898A Other specified complication of vascular prosthetic devices, implants and grafts, initial encounter: Secondary | ICD-10-CM

## 2018-02-02 DIAGNOSIS — Z992 Dependence on renal dialysis: Secondary | ICD-10-CM

## 2018-02-02 DIAGNOSIS — Z87891 Personal history of nicotine dependence: Secondary | ICD-10-CM | POA: Insufficient documentation

## 2018-02-02 DIAGNOSIS — Z48812 Encounter for surgical aftercare following surgery on the circulatory system: Secondary | ICD-10-CM

## 2018-02-02 DIAGNOSIS — R52 Pain, unspecified: Secondary | ICD-10-CM | POA: Insufficient documentation

## 2018-02-02 DIAGNOSIS — I120 Hypertensive chronic kidney disease with stage 5 chronic kidney disease or end stage renal disease: Secondary | ICD-10-CM | POA: Insufficient documentation

## 2018-02-02 DIAGNOSIS — R509 Fever, unspecified: Secondary | ICD-10-CM | POA: Insufficient documentation

## 2018-02-02 DIAGNOSIS — E1122 Type 2 diabetes mellitus with diabetic chronic kidney disease: Secondary | ICD-10-CM | POA: Insufficient documentation

## 2018-02-02 HISTORY — DX: Headache: R51

## 2018-02-02 HISTORY — DX: Pure hypercholesterolemia, unspecified: E78.00

## 2018-02-02 HISTORY — PX: AV FISTULA PLACEMENT: SHX1204

## 2018-02-02 HISTORY — DX: Headache, unspecified: R51.9

## 2018-02-02 LAB — GLUCOSE, CAPILLARY
GLUCOSE-CAPILLARY: 143 mg/dL — AB (ref 70–99)
GLUCOSE-CAPILLARY: 148 mg/dL — AB (ref 70–99)

## 2018-02-02 LAB — POCT I-STAT 4, (NA,K, GLUC, HGB,HCT)
GLUCOSE: 157 mg/dL — AB (ref 70–99)
HEMATOCRIT: 32 % — AB (ref 39.0–52.0)
Hemoglobin: 10.9 g/dL — ABNORMAL LOW (ref 13.0–17.0)
Potassium: 4.3 mmol/L (ref 3.5–5.1)
SODIUM: 136 mmol/L (ref 135–145)

## 2018-02-02 SURGERY — ARTERIOVENOUS (AV) FISTULA CREATION
Anesthesia: Monitor Anesthesia Care | Site: Arm Upper | Laterality: Left

## 2018-02-02 MED ORDER — CEFAZOLIN SODIUM-DEXTROSE 2-4 GM/100ML-% IV SOLN
2.0000 g | INTRAVENOUS | Status: AC
  Administered 2018-02-02: 2 g via INTRAVENOUS

## 2018-02-02 MED ORDER — ONDANSETRON HCL 4 MG/2ML IJ SOLN
INTRAMUSCULAR | Status: DC | PRN
  Administered 2018-02-02: 4 mg via INTRAVENOUS

## 2018-02-02 MED ORDER — SODIUM CHLORIDE 0.9 % IV SOLN
INTRAVENOUS | Status: AC
  Filled 2018-02-02: qty 1.2

## 2018-02-02 MED ORDER — LIDOCAINE-EPINEPHRINE 0.5 %-1:200000 IJ SOLN
INTRAMUSCULAR | Status: AC
  Filled 2018-02-02: qty 1

## 2018-02-02 MED ORDER — DEXAMETHASONE SODIUM PHOSPHATE 10 MG/ML IJ SOLN
INTRAMUSCULAR | Status: DC | PRN
  Administered 2018-02-02: 4 mg via INTRAVENOUS

## 2018-02-02 MED ORDER — HEPARIN SODIUM (PORCINE) 5000 UNIT/ML IJ SOLN
INTRAMUSCULAR | Status: DC | PRN
  Administered 2018-02-02: 11:00:00

## 2018-02-02 MED ORDER — CHLORHEXIDINE GLUCONATE 4 % EX LIQD: 60.0000 mL | Freq: Once | CUTANEOUS | Status: DC

## 2018-02-02 MED ORDER — HYDROCODONE-ACETAMINOPHEN 5-325 MG PO TABS: 1.0000 | ORAL_TABLET | Freq: Four times a day (QID) | ORAL | 0 refills | Status: DC | PRN

## 2018-02-02 MED ORDER — PROPOFOL 500 MG/50ML IV EMUL
INTRAVENOUS | Status: DC | PRN
  Administered 2018-02-02: 75 ug/kg/min via INTRAVENOUS

## 2018-02-02 MED ORDER — 0.9 % SODIUM CHLORIDE (POUR BTL) OPTIME
TOPICAL | Status: DC | PRN
  Administered 2018-02-02: 1000 mL

## 2018-02-02 MED ORDER — LIDOCAINE-EPINEPHRINE 0.5 %-1:200000 IJ SOLN
INTRAMUSCULAR | Status: DC | PRN
  Administered 2018-02-02: 15 mL

## 2018-02-02 MED ORDER — SODIUM CHLORIDE 0.9 % IV SOLN
INTRAVENOUS | Status: DC
  Administered 2018-02-02: 25 mL/h via INTRAVENOUS

## 2018-02-02 SURGICAL SUPPLY — 34 items
ADH SKN CLS APL DERMABOND .7 (GAUZE/BANDAGES/DRESSINGS) ×1
ARMBAND PINK RESTRICT EXTREMIT (MISCELLANEOUS) ×4 IMPLANT
CANISTER SUCT 3000ML PPV (MISCELLANEOUS) ×3 IMPLANT
CANNULA VESSEL 3MM 2 BLNT TIP (CANNULA) ×3 IMPLANT
CLIP LIGATING EXTRA MED SLVR (CLIP) ×3 IMPLANT
CLIP LIGATING EXTRA SM BLUE (MISCELLANEOUS) ×3 IMPLANT
COVER PROBE W GEL 5X96 (DRAPES) ×1 IMPLANT
DECANTER SPIKE VIAL GLASS SM (MISCELLANEOUS) ×3 IMPLANT
DERMABOND ADVANCED (GAUZE/BANDAGES/DRESSINGS) ×2
DERMABOND ADVANCED .7 DNX12 (GAUZE/BANDAGES/DRESSINGS) ×1 IMPLANT
ELECT REM PT RETURN 9FT ADLT (ELECTROSURGICAL) ×3
ELECTRODE REM PT RTRN 9FT ADLT (ELECTROSURGICAL) ×1 IMPLANT
GLOVE BIO SURGEON STRL SZ 6.5 (GLOVE) ×1 IMPLANT
GLOVE BIO SURGEON STRL SZ7.5 (GLOVE) ×4 IMPLANT
GLOVE BIO SURGEONS STRL SZ 6.5 (GLOVE) ×1
GLOVE BIOGEL PI IND STRL 6.5 (GLOVE) IMPLANT
GLOVE BIOGEL PI INDICATOR 6.5 (GLOVE) ×4
GLOVE SS BIOGEL STRL SZ 7.5 (GLOVE) ×1 IMPLANT
GLOVE SUPERSENSE BIOGEL SZ 7.5 (GLOVE) ×2
GOWN STRL REUS W/ TWL LRG LVL3 (GOWN DISPOSABLE) ×3 IMPLANT
GOWN STRL REUS W/ TWL XL LVL3 (GOWN DISPOSABLE) IMPLANT
GOWN STRL REUS W/TWL LRG LVL3 (GOWN DISPOSABLE) ×6
GOWN STRL REUS W/TWL XL LVL3 (GOWN DISPOSABLE) ×3
KIT BASIN OR (CUSTOM PROCEDURE TRAY) ×3 IMPLANT
KIT TURNOVER KIT B (KITS) ×3 IMPLANT
NS IRRIG 1000ML POUR BTL (IV SOLUTION) ×3 IMPLANT
PACK CV ACCESS (CUSTOM PROCEDURE TRAY) ×3 IMPLANT
PAD ARMBOARD 7.5X6 YLW CONV (MISCELLANEOUS) ×6 IMPLANT
SUT PROLENE 6 0 CC (SUTURE) ×3 IMPLANT
SUT VIC AB 3-0 SH 27 (SUTURE) ×3
SUT VIC AB 3-0 SH 27X BRD (SUTURE) ×1 IMPLANT
TOWEL GREEN STERILE (TOWEL DISPOSABLE) ×3 IMPLANT
UNDERPAD 30X30 (UNDERPADS AND DIAPERS) ×3 IMPLANT
WATER STERILE IRR 1000ML POUR (IV SOLUTION) ×3 IMPLANT

## 2018-02-02 NOTE — Anesthesia Procedure Notes (Signed)
Procedure Name: MAC Date/Time: 02/02/2018 10:52 AM Performed by: Carney Living, CRNA Pre-anesthesia Checklist: Patient identified, Emergency Drugs available, Suction available, Patient being monitored and Timeout performed Patient Re-evaluated:Patient Re-evaluated prior to induction Oxygen Delivery Method: Nasal cannula Placement Confirmation: positive ETCO2

## 2018-02-02 NOTE — Anesthesia Postprocedure Evaluation (Signed)
Anesthesia Post Note  Patient: Blake Santana  Procedure(s) Performed: ARTERIOVENOUS (AV) FISTULA CREATION LEFT UPPER EXTREMITY (Left Arm Upper)     Patient location during evaluation: PACU Anesthesia Type: MAC Level of consciousness: awake and alert Pain management: pain level controlled Vital Signs Assessment: post-procedure vital signs reviewed and stable Respiratory status: spontaneous breathing, nonlabored ventilation, respiratory function stable and patient connected to nasal cannula oxygen Cardiovascular status: stable and blood pressure returned to baseline Postop Assessment: no apparent nausea or vomiting Anesthetic complications: no    Last Vitals:  Vitals:   02/02/18 1222 02/02/18 1238  BP: 138/73 (!) 143/75  Pulse: 97 69  Resp: 18 15  Temp: (!) 36.1 C   SpO2: 96% 97%    Last Pain:  Vitals:   02/02/18 1238  PainSc: 0-No pain                 Jezabella Schriever COKER

## 2018-02-02 NOTE — Telephone Encounter (Signed)
sch appt spk to pt 03/15/18 3pm Dialysis Duplex 4pm p/o PA

## 2018-02-02 NOTE — Op Note (Signed)
    OPERATIVE REPORT  DATE OF SURGERY: 02/02/2018  PATIENT: Blake Santana, 47 y.o. male MRN: [id number]  DOB: [birth year]  PRE-OPERATIVE DIAGNOSIS: End-stage renal disease  POST-OPERATIVE DIAGNOSIS:  Same  PROCEDURE: Left upper arm AV fistula creation, brachiocephalic  SURGEON:  Curt Jews, M.D.  PHYSICIAN ASSISTANT: Matt Eveland, PA-C  ANESTHESIA: Local with sedation  EBL: Minimal ml  Total I/O In: 520 [P.O.:120; I.V.:400] Out: -   BLOOD ADMINISTERED: None  DRAINS: None  SPECIMEN: None  COUNTS CORRECT:  YES  PLAN OF CARE: PACU  PATIENT DISPOSITION:  PACU - hemodynamically stable  PROCEDURE DETAILS: Patient was taken the operating placed to position the area of the left arm was prepped and draped in usual sterile fashion.  Using local anesthesia incision made over the antecubital space and could not isolate the cephalic vein which was a large caliber in the brachial artery which was a small size.  The vein was mobilized at the antecubital space and tributary branches were ligated with 3-0 and 4-0 silk ties and divided.  The vein was ligated distally and was divided and mobilized to the level of the brachial artery.  The artery was occluded proximally distally and was opened with 11 blade and sent mostly with Potts scissors.  The vein was sewn end-to-side to the artery with a running 6-0 Prolene suture.  Clamps were removed and excellent thrill was noted.  Wounds were closed with 3-0 Vicryl in the subcutaneous and subcuticular tissue.  Sterile dressing was applied.  Should be noted the patient did have a left radiocephalic fistula which was patent but was felt to be not large enough for dialysis.  This did maintain a fistula and excellent radial pulse.   [name], M.D., [facility] 02/02/2018 5:45 PM

## 2018-02-02 NOTE — Transfer of Care (Signed)
Immediate Anesthesia Transfer of Care Note  Patient: Blake Santana  Procedure(s) Performed: ARTERIOVENOUS (AV) FISTULA CREATION LEFT UPPER EXTREMITY (Left Arm Upper)  Patient Location: PACU  Anesthesia Type:MAC  Level of Consciousness: awake, alert , oriented and patient cooperative  Airway & Oxygen Therapy: Patient Spontanous Breathing and Patient connected to nasal cannula oxygen  Post-op Assessment: Report given to RN, Post -op Vital signs reviewed and stable and Patient moving all extremities X 4  Post vital signs: Reviewed and stable  Last Vitals:  Vitals Value Taken Time  BP 134/74 02/02/2018 12:07 PM  Temp    Pulse 68 02/02/2018 12:08 PM  Resp 15 02/02/2018 12:08 PM  SpO2 100 % 02/02/2018 12:08 PM  Vitals shown include unvalidated device data.  Last Pain:  Vitals:   02/02/18 0919  PainSc: 0-No pain         Complications: No apparent anesthesia complications

## 2018-02-02 NOTE — Anesthesia Preprocedure Evaluation (Addendum)
Anesthesia Evaluation  Patient identified by MRN, date of birth, ID band Patient awake    Reviewed: Allergy & Precautions, NPO status , Patient's Chart, lab work & pertinent test results, reviewed documented beta blocker date and time   Airway Mallampati: II  TM Distance: >3 FB Neck ROM: Full    Dental  (+) Teeth Intact, Dental Advisory Given   Pulmonary former smoker,    breath sounds clear to auscultation       Cardiovascular hypertension, Pt. on medications and Pt. on home beta blockers  Rhythm:Regular Rate:Normal     Neuro/Psych    GI/Hepatic   Endo/Other  diabetes, Type 2, Oral Hypoglycemic Agents  Renal/GU ESRF and DialysisRenal disease     Musculoskeletal   Abdominal   Peds  Hematology   Anesthesia Other Findings   Reproductive/Obstetrics                            Anesthesia Physical Anesthesia Plan  ASA: III  Anesthesia Plan: MAC   Post-op Pain Management:    Induction:   PONV Risk Score and Plan: Ondansetron and Dexamethasone  Airway Management Planned: Natural Airway and Simple Face Mask  Additional Equipment:   Intra-op Plan:   Post-operative Plan:   Informed Consent: I have reviewed the patients History and Physical, chart, labs and discussed the procedure including the risks, benefits and alternatives for the proposed anesthesia with the patient or authorized representative who has indicated his/her understanding and acceptance.   Dental advisory given  Plan Discussed with: CRNA, Anesthesiologist and Surgeon  Anesthesia Plan Comments:        Anesthesia Quick Evaluation

## 2018-02-02 NOTE — Discharge Instructions (Signed)
° °  [facility]  ° °Discharge Instructions ° °AV Fistula or Graft Surgery for Dialysis Access ° °Please refer to the following instructions for your post-procedure care. Your surgeon or physician assistant will discuss any changes with you. ° °Activity ° °You may drive the day following your surgery, if you are comfortable and no longer taking prescription pain medication. Resume full activity as the soreness in your incision resolves. ° °Bathing/Showering ° °You may shower after you go home. Keep your incision dry for 48 hours. Do not soak in a bathtub, hot tub, or swim until the incision heals completely. You may not shower if you have a hemodialysis catheter. ° °Incision Care ° °Clean your incision with mild soap and water after 48 hours. Pat the area dry with a clean towel. You do not need a bandage unless otherwise instructed. Do not apply any ointments or creams to your incision. You may have skin glue on your incision. Do not peel it off. It will come off on its own in about one week. Your arm may swell a bit after surgery. To reduce swelling use pillows to elevate your arm so it is above your heart. Your doctor will tell you if you need to lightly wrap your arm with an ACE bandage. ° °Diet ° °Resume your normal diet. There are not special food restrictions following this procedure. In order to heal from your surgery, it is CRITICAL to get adequate nutrition. Your body requires vitamins, minerals, and protein. Vegetables are the best source of vitamins and minerals. Vegetables also provide the perfect balance of protein. Processed food has little nutritional value, so try to avoid this. ° °Medications ° °Resume taking all of your medications. If your incision is causing pain, you may take over-the counter pain relievers such as acetaminophen (Tylenol). If you were prescribed a stronger pain medication, please be aware these medications can cause nausea and constipation. Prevent  nausea by taking the medication with a snack or meal. Avoid constipation by drinking plenty of fluids and eating foods with high amount of fiber, such as fruits, vegetables, and grains. Do not take Tylenol if you are taking prescription pain medications. ° ° ° ° °Follow up °Your surgeon may want to see you in the office following your access surgery. If so, this will be arranged at the time of your surgery. ° °Please call us immediately for any of the following conditions: ° °Increased pain, redness, drainage (pus) from your incision site °Fever of 101 degrees or higher °Severe or worsening pain at your incision site °Hand pain or numbness. ° °Reduce your risk of vascular disease: ° °Stop smoking. If you would like help, call [facility] at 1-800-QUIT-NOW (1-800-784-8669) or [facility] at 336-586-4000 ° °Manage your cholesterol °Maintain a desired weight °Control your diabetes °Keep your blood pressure down ° °Dialysis ° °It will take several weeks to several months for your new dialysis access to be ready for use. Your surgeon will determine when it is OK to use it. Your nephrologist will continue to direct your dialysis. You can continue to use your Permcath until your new access is ready for use. ° °If you have any questions, please call the office at [phone]. ° °

## 2018-02-02 NOTE — H&P (Signed)
   Patient name: Blake Santana MRN: [id number] DOB: Oct 31, 1970 Sex: male    HPI: Blake Santana is a 47 y.o. male patient has end-stage renal disease with dialysis via tunneled catheter.  He had a prior left radiocephalic fistula which appeared to be maturing nicely.  Axis tenderness continued to have difficulty accessing his forearm fistula.  He did undergo a recent fistulogram which showed no ability to correct this.  He is brought to the operating room now for a left upper arm AV fistula creation  Current Facility-Administered Medications  Medication Dose Route Frequency Provider Last Rate Last Dose  . 0.9 %  sodium chloride infusion   Intravenous Continuous [name], MD 25 mL/hr at 02/02/18 0929 25 mL/hr at 02/02/18 0929  . ceFAZolin (ANCEF) IVPB 2g/100 mL premix  2 g Intravenous 30 min Pre-Op Aireona Torelli, Arvilla Meres, MD      . chlorhexidine (HIBICLENS) 4 % liquid 4 application  60 mL Topical Once Gardiner Espana, Arvilla Meres, MD       And  . Derrill Memo ON 02/03/2018] chlorhexidine (HIBICLENS) 4 % liquid 4 application  60 mL Topical Once Tytan Sandate, Arvilla Meres, MD         PHYSICAL EXAM: Vitals:   02/02/18 0856 02/02/18 0905  BP:  (!) 153/79  Pulse:  72  Resp:  18  Temp:  98.6 F (37 C)  SpO2:  100%  Weight: 130 lb (59 kg)   Height: 5\' 2"  (1.575 m)     GENERAL: The patient is a well-nourished male, in no acute distress. The vital signs are documented above. Palpable radial pulse on the left.  Adequate cephalic vein at the antecubital space  MEDICAL ISSUES: End-stage renal disease for left upper arm AV fistula creation   [name], MD FACS [facility] Tel [phone] Pager [phone]

## 2018-02-03 ENCOUNTER — Encounter (HOSPITAL_COMMUNITY): Payer: Self-pay | Admitting: Vascular Surgery

## 2018-03-04 NOTE — Telephone Encounter (Signed)
Did not hear back from Dr. Arty Baumgartner and patient had already stopped the Terbinafine for some time with continued normal grow out of nails at last visit, so will not pursue this further.

## 2018-03-15 ENCOUNTER — Encounter (HOSPITAL_COMMUNITY): Payer: Self-pay

## 2018-03-15 ENCOUNTER — Encounter: Payer: Self-pay | Admitting: Vascular Surgery

## 2018-03-15 ENCOUNTER — Encounter: Payer: Self-pay | Admitting: Physician Assistant

## 2018-03-15 NOTE — Progress Notes (Deleted)
  POST OPERATIVE OFFICE NOTE    CC:  F/u for surgery  HPI:  This is a 47 y.o. male who is s/p left AV fistula brachiocephalic 01/02/2243.  He is here today for a f/u fistula duplex and examination.  He had a previous left radiocephalic fistula that failed.  He is currently on HD via [facility].  He denise pain, loss of sensation and loss of motor.  He has not had fever or chills.  There have been no medical changes since his last visit.    No Known Allergies  Current Outpatient Medications  Medication Sig Dispense Refill  . amLODipine (NORVASC) 5 MG tablet 1 tab by mouth daily in the morning with breakfast 30 tablet 11  . atorvastatin (LIPITOR) 40 MG tablet 1 tab by mouth daily with evening meal 30 tablet 11  . Blood Glucose Monitoring Suppl (AGAMATRIX PRESTO) w/Device KIT Check sugars twice daily 1 kit 0  . calcium acetate (PHOSLO) 667 MG capsule 2 caps by mouth three times daily with meals    . glipiZIDE (GLUCOTROL) 5 MG tablet 1 tab by mouth twice daily with meals 60 tablet 11  . glucose blood (AGAMATRIX PRESTO TEST) test strip Check sugars twice daily 100 each 12  . HYDROcodone-acetaminophen (NORCO) 5-325 MG tablet Take 1 tablet by mouth every 6 (six) hours as needed for moderate pain. 8 tablet 0  . lidocaine-prilocaine (EMLA) cream Apply 1 application topically 3 (three) times a week. Tuesday, Thursday and Saturday  3  . metoprolol tartrate (LOPRESSOR) 25 MG tablet 1/2 tab by mouth twice daily with meals 30 tablet 11  . terbinafine (LAMISIL) 250 MG tablet Take 1 tablet (250 mg total) by mouth daily. (Patient not taking: Reported on 12/02/2017) 84 tablet 0   No current facility-administered medications for this visit.      ROS:  See HPI  Physical Exam:  There were no vitals filed for this visit.  Incision:  *** Extremities:  *** Neuro: *** Abdomen:  ***  Assessment/Plan:  This is a 47 y.o. male who is s/p: ***  -***   Roxy Horseman , PA-C [facility] [phone]

## 2018-03-20 ENCOUNTER — Encounter (HOSPITAL_COMMUNITY): Payer: Self-pay | Admitting: Emergency Medicine

## 2018-03-20 ENCOUNTER — Emergency Department (HOSPITAL_COMMUNITY)
Admission: EM | Admit: 2018-03-20 | Discharge: 2018-03-20 | Disposition: A | Discharge status: Home / Self Care | Payer: Self-pay | Attending: Emergency Medicine | Admitting: Emergency Medicine

## 2018-03-20 ENCOUNTER — Emergency Department (HOSPITAL_COMMUNITY): Payer: Self-pay

## 2018-03-20 DIAGNOSIS — R05 Cough: Secondary | ICD-10-CM | POA: Insufficient documentation

## 2018-03-20 DIAGNOSIS — Z8709 Personal history of other diseases of the respiratory system: Secondary | ICD-10-CM

## 2018-03-20 DIAGNOSIS — Z87898 Personal history of other specified conditions: Secondary | ICD-10-CM

## 2018-03-20 DIAGNOSIS — M79674 Pain in right toe(s): Secondary | ICD-10-CM | POA: Insufficient documentation

## 2018-03-20 DIAGNOSIS — M79675 Pain in left toe(s): Secondary | ICD-10-CM | POA: Insufficient documentation

## 2018-03-20 LAB — BASIC METABOLIC PANEL
Anion gap: 12 (ref 5–15)
BUN: 45 mg/dL — ABNORMAL HIGH (ref 6–20)
CHLORIDE: 98 mmol/L (ref 98–111)
CO2: 28 mmol/L (ref 22–32)
Calcium: 9.2 mg/dL (ref 8.9–10.3)
Creatinine, Ser: 8.22 mg/dL — ABNORMAL HIGH (ref 0.61–1.24)
GFR calc non Af Amer: 7 mL/min — ABNORMAL LOW (ref >60)
GFR, EST AFRICAN AMERICAN: 8 mL/min — AB (ref >60)
Glucose, Bld: 201 mg/dL — ABNORMAL HIGH (ref 70–99)
POTASSIUM: 4.7 mmol/L (ref 3.5–5.1)
SODIUM: 138 mmol/L (ref 135–145)

## 2018-03-20 LAB — CBC WITH DIFFERENTIAL/PLATELET
ABS IMMATURE GRANULOCYTES: 0 10*3/uL (ref 0.0–0.1)
BASOS ABS: 0.1 10*3/uL (ref 0.0–0.1)
Basophils Relative: 1 %
Eosinophils Absolute: 0.7 10*3/uL (ref 0.0–0.7)
Eosinophils Relative: 8 %
HCT: 37.7 % — ABNORMAL LOW (ref 39.0–52.0)
HEMOGLOBIN: 12 g/dL — AB (ref 13.0–17.0)
IMMATURE GRANULOCYTES: 0 %
LYMPHS PCT: 25 %
Lymphs Abs: 2.3 10*3/uL (ref 0.7–4.0)
MCH: 30.6 pg (ref 26.0–34.0)
MCHC: 31.8 g/dL (ref 30.0–36.0)
MCV: 96.2 fL (ref 78.0–100.0)
MONO ABS: 0.9 10*3/uL (ref 0.1–1.0)
Monocytes Relative: 10 %
NEUTROS ABS: 5.1 10*3/uL (ref 1.7–7.7)
Neutrophils Relative %: 56 %
Platelets: 180 10*3/uL (ref 150–400)
RBC: 3.92 MIL/uL — AB (ref 4.22–5.81)
RDW: 13.5 % (ref 11.5–15.5)
WBC: 9 10*3/uL (ref 4.0–10.5)

## 2018-03-20 MED ORDER — HYDROCODONE-ACETAMINOPHEN 5-325 MG PO TABS: 1.0000 | ORAL_TABLET | ORAL | 0 refills | Status: DC | PRN

## 2018-03-20 NOTE — ED Provider Notes (Addendum)
[facility] EMERGENCY DEPARTMENT Provider Note   CSN: [id number] Arrival date & time: 03/20/18  1251     History   Chief Complaint Chief Complaint  Patient presents with  . Foot Pain    HPI           Stratus video interpreter used during this visit. Blake Santana is a 47 y.o. male presenting for bilateral great toe pain that began gradually approximately 1 week ago.  Patient denies injury or trauma to his great toes.  Additionally patient states that his right great toe has become red and more painful over the past 3 days.  He describes pain as a sharp 7/10 in severity that is worse with walking and touching the toes.  Patient states that he has not taken anything for his pain.  Patient also complaining of chronic cough that has been present for greater than 1 year.  Patient states that it is nonproductive, nonpainful.  Patient denies history of fevers, night sweats, weight loss, chest pain or shortness of breath.  Additionally patient is a dialysis patient, last dialyzed yesterday. HPI  Past Medical History:  Diagnosis Date  . Anemia of chronic kidney failure 03/11/2017  . Chronic kidney disease 03/11/2017  . Diabetes mellitus without complication ([place]) 9201  . Edema 01/14/2017  . Headache   . Hypercholesterolemia   . Hypertension 03/11/2017  . Iron deficiency anemia 06/18/2016  . RSV (respiratory syncytial virus infection) 08/2017    Patient Active Problem List   Diagnosis Date Noted  . Malnutrition of moderate degree 08/11/2017  . RSV (respiratory syncytial virus infection) 08/09/2017  . ESRD (end stage renal disease) ([place]) 08/08/2017  . Acute CHF (congestive heart failure) ([place]) 04/03/2017  . Anemia of chronic kidney failure 03/11/2017  . Hypertension 03/11/2017  . Chronic kidney disease 03/11/2017  . Edema 01/14/2017  . Dyslipidemia 10/15/2016  . Diabetic retinopathy ([place]) 10/15/2016  . Microalbuminuria 06/18/2016  . Iron deficiency anemia  06/18/2016  . Uncontrolled type 2 diabetes mellitus with complication, without long-term current use of insulin ([place]) 10/20/2001    Past Surgical History:  Procedure Laterality Date  . A/V FISTULAGRAM Left 01/19/2018   Procedure: A/V FISTULAGRAM - Left AV;  Surgeon: Waynetta Sandy, MD;  Location: [facility];  Service: Cardiovascular;  Laterality: Left;  . AV FISTULA PLACEMENT Left 08/09/2017   Procedure: LEFT RADIOCEPHALIC ARTERIOVENOUS (AV) FISTULA CREATION;  Surgeon: [name], MD;  [facility];  Service: Vascular;  Laterality: Left;  . AV FISTULA PLACEMENT Left 02/02/2018   Procedure: ARTERIOVENOUS (AV) FISTULA CREATION LEFT UPPER EXTREMITY;  Surgeon: [name], MD;  Location: [facility];  Service: Vascular;  Laterality: Left;  . INSERTION OF DIALYSIS CATHETER  08/09/2017  . INSERTION OF DIALYSIS CATHETER Right 08/09/2017   Procedure: INSERTION OF DIALYSIS CATHETER RIGHT INTERNAL JUGULAR;  Surgeon: [name], MD;  [facility];  Service: Vascular;  Laterality: Right;        Home Medications    Prior to Admission medications   Medication Sig Start Date End Date Taking? Authorizing Provider  amLODipine (NORVASC) 5 MG tablet 1 tab by mouth daily in the morning with breakfast 10/22/17   Mack Hook, MD  atorvastatin (LIPITOR) 40 MG tablet 1 tab by mouth daily with evening meal 10/22/17   Mack Hook, MD  Blood Glucose Monitoring Suppl [facility] PRESTO) w/Device KIT Check sugars twice daily 06/04/16   Mack Hook, MD  calcium acetate (PHOSLO) 667 MG capsule  2 caps by mouth three times daily with meals 10/22/17   Mack Hook, MD  glipiZIDE (GLUCOTROL) 5 MG tablet 1 tab by mouth twice daily with meals 10/22/17   Mack Hook, MD  glucose blood (AGAMATRIX PRESTO TEST) test strip Check sugars twice daily 06/04/16   Mack Hook, MD  HYDROcodone-acetaminophen (NORCO/VICODIN) 5-325 MG tablet Take 1 tablet by mouth every 4 (four)  hours as needed. 03/20/18   Nuala Alpha A, PA-C  lidocaine-prilocaine (EMLA) cream Apply 1 application topically 3 (three) times a week. Tuesday, Thursday and Saturday 11/09/17   [provider]  metoprolol tartrate (LOPRESSOR) 25 MG tablet 1/2 tab by mouth twice daily with meals 10/22/17   Mack Hook, MD  terbinafine (LAMISIL) 250 MG tablet Take 1 tablet (250 mg total) by mouth daily. Patient not taking: Reported on 12/02/2017 10/22/17   Mack Hook, MD    Family History Family History  Problem Relation Age of Onset  . Diabetes Mother   . Kidney disease Mother        renal failure--diabetes, cause of death  . Hypertension Mother   . Diabetes Father   . Kidney disease Father        Cause of death:  kidney failure form diabetes  . Alcohol abuse Father   . Diabetes Sister     Social History Social History   Tobacco Use  . Smoking status: Former Smoker    Types: Cigarettes  . Smokeless tobacco: Never Used  Substance Use Topics  . Alcohol use: Not Currently  . Drug use: No     Allergies   Patient has no known allergies.   Review of Systems Review of Systems  Constitutional: Negative.  Negative for chills and fever.  HENT: Negative.  Negative for rhinorrhea and sore throat.   Eyes: Negative.  Negative for visual disturbance.  Respiratory: Positive for cough. Negative for shortness of breath and wheezing.   Cardiovascular: Negative.  Negative for chest pain and leg swelling.  Gastrointestinal: Negative.  Negative for abdominal pain, blood in stool, diarrhea, nausea and vomiting.  Genitourinary: Negative.  Negative for dysuria and hematuria.  Musculoskeletal: Positive for arthralgias and joint swelling. Negative for myalgias.  Skin: Positive for color change. Negative for rash.  Neurological: Negative.  Negative for dizziness, weakness and headaches.     Physical Exam Updated Vital Signs BP (!) 141/85 (BP Location: Right Arm)   Pulse 76   Temp  98 F (36.7 C) (Oral)   Resp 16   SpO2 100%   Physical Exam  Constitutional: He is oriented to person, place, and time. He appears well-developed and well-nourished. No distress.  HENT:  Head: Normocephalic and atraumatic.  Right Ear: External ear normal.  Left Ear: External ear normal.  Nose: Nose normal.  Eyes: Pupils are equal, round, and reactive to light. EOM are normal.  Neck: Trachea normal and normal range of motion. No tracheal deviation present.  Cardiovascular: Normal rate, regular rhythm, normal heart sounds and intact distal pulses.  Pulses:      Dorsalis pedis pulses are 2+ on the right side, and 2+ on the left side.       Posterior tibial pulses are 2+ on the right side, and 2+ on the left side.  Pulmonary/Chest: Effort normal and breath sounds normal. No respiratory distress. He has no wheezes.  Abdominal: Soft. There is no tenderness. There is no rebound and no guarding.  Musculoskeletal: Normal range of motion. He exhibits no deformity.  Right foot: There is tenderness. There is normal range of motion, no swelling, normal capillary refill, no crepitus, no deformity and no laceration.       Left foot: There is tenderness. There is normal range of motion, no swelling, normal capillary refill, no crepitus, no deformity and no laceration.       Feet:  Feet:  Right Foot:  Protective Sensation: 3 sites tested. 3 sites sensed.  Skin Integrity: Negative for skin breakdown or erythema.  Left Foot:  Protective Sensation: 3 sites tested. 3 sites sensed.  Skin Integrity: Positive for erythema. Negative for skin breakdown.  Neurological: He is alert and oriented to person, place, and time.  Skin: Skin is warm, dry and intact. Capillary refill takes less than 2 seconds. No abrasion, no laceration, no lesion and no rash noted.     Psychiatric: He has a normal mood and affect. His behavior is normal.     ED Treatments / Results  Labs (all labs ordered are listed, but  only abnormal results are displayed) Labs Reviewed  CBC WITH DIFFERENTIAL/PLATELET - Abnormal; Notable for the following components:      Result Value   RBC 3.92 (*)    Hemoglobin 12.0 (*)    HCT 37.7 (*)    All other components within normal limits  BASIC METABOLIC PANEL - Abnormal; Notable for the following components:   Glucose, Bld 201 (*)    BUN 45 (*)    Creatinine, Ser 8.22 (*)    GFR calc non Af Amer 7 (*)    GFR calc Af Amer 8 (*)    All other components within normal limits    EKG None  Radiology Dg Chest 2 View  Result Date: 03/20/2018 CLINICAL DATA:  Toe pain. History of diabetes, end-stage renal disease on dialysis. EXAM: CHEST - 2 VIEW COMPARISON:  Chest radiograph August 09, 2017 FINDINGS: Cardiac silhouette is mildly enlarged, mediastinal silhouette is not suspicious. Mild chronic bronchitic changes without pleural effusion or focal consolidation. Curvilinear density projecting posterior heart on lateral view is unchanged, probable scarring. No pneumothorax. Tunneled dialysis catheter via RIGHT internal jugular venous approach with distal tip projecting in proximal RIGHT atrium. Soft tissue planes and included osseous structures are non suspicious. Suspected osteopenia. IMPRESSION: Mild cardiomegaly.  Chronic bronchitic changes. Electronically Signed   By: Elon Alas M.D.   On: 03/20/2018 17:50   Dg Foot Complete Left  Result Date: 03/20/2018 CLINICAL DATA:  Pain volar aspect distal foot EXAM: LEFT FOOT - COMPLETE 3+ VIEW COMPARISON:  Left foot radiographs and left foot CT January 31, 2010 FINDINGS: Frontal, oblique, and lateral views obtained. There is an old healed fracture of the left calcaneus with remodeling. There is no evident acute fracture or dislocation. Joint spaces appear normal except for mild erosion along the medial aspect of the first proximal phalanx at the first MTP joint. No erosive changes noted. There is extensive arterial vascular calcification. No  radiopaque foreign body. IMPRESSION: 1. Extensive arterial vascular calcification consistent with known diabetes mellitus. 2. Old comminuted fracture of the calcaneus with remodeling. No acute fracture. No dislocation. 3. Small erosion along the medial aspect of the first MTP joint. This finding raises concern for possible gout. Appropriate laboratory correlation advised. Other joint spaces appear normal. 4. No soft tissue air or radiopaque foreign body evident. No bony destruction. Electronically Signed   By: Lowella Grip III M.D.   On: 03/20/2018 17:42   Dg Foot Complete Right  Result Date: 03/20/2018  CLINICAL DATA:  Toe pain, discoloration.  History of diabetes. EXAM: RIGHT FOOT COMPLETE - 3+ VIEW COMPARISON:  None. FINDINGS: No acute fracture deformity or dislocation. No destructive bony lesions. Severe vascular calcifications. IMPRESSION: 1. No acute osseous process. 2. Severe vascular calcifications. Electronically Signed   By: Elon Alas M.D.   On: 03/20/2018 17:42    Procedures Procedures (including critical care time)  Medications Ordered in ED Medications - No data to display   Initial Impression / Assessment and Plan / ED Course  I have reviewed the triage vital signs and the nursing notes.  Pertinent labs & imaging results that were available during my care of the patient were reviewed by me and considered in my medical decision making (see chart for details).    Patient presenting for bilateral great toe pain.  Right great toe is slightly dark red however no increased warmth, no fluctuance, no swelling, no streaking or other changes suggestive of infection.  Patient is afebrile, not tachycardic.  Full range of motion, sensation and cap refill intact to all toes.   BMP without acute changes, creatinine appears at baseline, dialysis patient. CBC nonacute. Chest x-ray with chronic bronchitic changes, no acute changes noted. Imaging of bilateral feet as shown above with  bilateral vascular calcifications.  Left foot concerning for possible gout.  Right foot without acute osseous process.  Stratus video translator used at discharge.  Patient given short course of pain medicine for his possible gout.  [place] PMP Aware does not show that the patient receives any controlled substances at this time.  I encouraged the patient to follow-up with his primary care provider as soon as he can regarding his visit today and further evaluation of possible gout.  Patient states that he has a primary care doctor that he can reliably follow-up with.  Patient informed that he cannot drive or operate machinery while taking Norco.  Afebrile, not tachycardic, white blood cell count within normal limits.  I doubt infectious process at this time.  Additionally results of chest x-ray discussed with patient, patient encouraged to follow-up with his primary care provider regarding this as well and return to the emergency department for any new or worsening symptoms.  O2 saturation 100% on room air, not tachypneic, not tachycardic, denying shortness of breath or chest pain.  At this time there does not appear to be any evidence of an acute emergency medical condition and the patient appears stable for discharge with appropriate outpatient follow up. Diagnosis was discussed with patient who verbalizes understanding of care plan and is agreeable to discharge. I have discussed return precautions with patient who verbalizes understanding of return precautions. Patient strongly encouraged to follow-up with their PCP. All questions answered.  Patient's case discussed with Dr. Roderic Palau who agrees with plan to discharge with follow-up.     Note: Portions of this report may have been transcribed using voice recognition software. Every effort was made to ensure accuracy; however, inadvertent computerized transcription errors may still be present.   Final Clinical Impressions(s) / ED Diagnoses   Final  diagnoses:  Toe pain, bilateral  History of chronic cough    ED Discharge Orders         Ordered    HYDROcodone-acetaminophen (NORCO/VICODIN) 5-325 MG tablet  Every 4 hours PRN     03/20/18 1858           Deliah Boston, PA-C 03/20/18 2314    Nuala Alpha A, PA-C 03/20/18 2317    [place],  Broadus John, MD 03/22/18 1135

## 2018-03-20 NOTE — ED Notes (Signed)
PA saw patient and helped this RN set acuity for patient

## 2018-03-20 NOTE — ED Triage Notes (Signed)
Pt presents to ED for assessment of toe pain to the pads of the great toe of his left and right foot, and the second digit of his left foot.  Some darkening noted to patient's toes on the left foot.  Not hot to the touch.  Foot is warm, pulses intact.  Pt states sugar today was 220.

## 2018-03-20 NOTE — Discharge Instructions (Addendum)
Please return to the Emergency Department for any new or worsening symptoms or if your symptoms do not improve. Please be sure to follow up with your Primary Care Physician as soon as possible regarding your visit today. You may use the pain medication Norco as prescribed.  Please do not drive or operate machinery while taking this medication. Google translation below may contain errors. 1. Regrese al Departamento de Emergencias por cualquier sntoma nuevo o que empeore o si sus sntomas no mejoran. 2. Asegrese de hacer un seguimiento con su mdico de atencin primaria lo antes posible con respecto a su visita de hoy. 3. Puede usar el medicamento para el dolor Norco segn lo prescrito. No conduzca ni maneje maquinaria mientras est tomando este medicamento  SOLICITE AYUDA SI: Phylliss Bob otra crisis de gota. Sigue teniendo sntomas de [place] crisis de gota despus de 10 das de [place]. Tiene problemas (efectos secundarios) debido a los medicamentos. Tiene escalofros o fiebre. Tiene sensacin de [employer] al [employer]. Siente dolor en la parte inferior de la espalda o en el abdomen. SOLICITE AYUDA DE INMEDIATO SI: Siente mucho dolor. No es posible Financial controller. No puede orinar.  RESOURCE GUIDE  Chronic Pain Problems: Contact [facility]  [phone] Patients need to be referred by their primary care doctor.  Insufficient Money for Medicine: Contact [employer] Way:  call "211" or [place] [phone].  No Primary Care Doctor: Call [facility]  [phone] - can help you locate a primary care doctor that  accepts your insurance, provides certain services, etc. Physician Referral Service- [phone]  Agencies that provide inexpensive medical care: [facility]  [facility]  [phone] [facility]  [phone] [facility]  [phone] Planned Parenthood  [phone] [facility]   [phone]  [place] Providers: [facility]- [address], [address]  [phone], Mon-Fri 9am-7pm, Sat 9am-1pm [place], [address] [place]  [place], [address] [place]  [place]- [address]  [place], [address], [phone]  Only accepts [place] patients after they have their name  applied to their card  Self Pay (no insurance) in [facility]: Sickle Cell Patients: Dr Kevan Ny, [facility]  [place], [facility]- [place]  [facility] [place]- [address], [facility]- see information above (Speak to [employer] if you do not have insurance)       -  [facility]- [place], [place],  [facility], [place]  Dr Vista Lawman-  [address], [address], [place], [place] [facility]- [address] [phone]       -  [facility] [place]- [address], [place], also [address] [phone]       -    [facility]- [address],  [phone], 1st & 3rd Saturday   every month, 10am-1pm  1) Find a Doctor and Pay Out of Pocket Although you won't have to find out who is covered by your insurance plan, it is a good idea to ask around and get recommendations. You will then need to call the office and see if the doctor you have chosen will accept you as a new patient and what types of options they offer for patients who are self-pay. Some doctors offer discounts or will set up payment plans for their patients who do not have insurance, but you will need to ask so you aren't surprised when  you get to your appointment.  2) Contact Your Local Health Department Not all health departments have doctors that can see patients for sick visits, but many do, so it is worth a call to see if yours does. If you don't know where your local health department is, you can check in your phone book. The CDC also has a tool to help you locate your state's health department, and many state websites also have listings of all of their local health departments.  3) Find a [facility] [facility] If your illness is not likely to be very severe or complicated, you may want to try a walk in clinic. These are popping up all over the country in pharmacies, drugstores, and shopping centers. They're usually staffed by nurse practitioners or physician assistants that have been trained to treat common illnesses and complaints. They're usually fairly quick and inexpensive. However, if you have serious medical issues or chronic medical problems, these are probably not your best option  [facility] [place], [facility], [address] [place], phone [phone] or [phone].  Monday - Friday, call for an appointment. [place], [facility], [address], [place], phone [phone] or [phone].  Monday - Friday, call for an appointment.  Abuse/Neglect: [place] [phone] [place] [phone] (After Hours)  Emergency Shelter:  [facility] [phone]  Maternity Homes: Room at the [place] [phone] [place] [phone]  MRSA Hotline #:   (947)175-7785  [facility] [place] Dept. [address]         [place] Phone:  [phone]                                  Phone:  [phone]                   Phone:  [place], [phone]       -  [facility] in [place], [address]                                  [place] [phone] or [phone] (After Hours)   [place]  Substance Abuse Resources: Alcohol and Drug Services  [place] [phone] [facility] [place] Chinita Pester [phone] Residential & Outpatient Substance Abuse Program  [phone]  Psychological Services: [place]  [phone] [place]  [place], [place] [address] [place], [place]: [phone] or [phone], [employer]uy  Dental Assistance  If unable to pay or uninsured, contact:  [facility] or [facility]. to become qualified for the adult dental clinic.  Patients with Medicaid: [facility] [phone] [address], [place] [address], [phone]  If unable to pay, or uninsured, contact [facility] [phone]) or [place] [phone] in [place], [address] in [facility]) to become qualified for the adult dental clinic   Other [place]- [place], [place], [place], [address], [place], [place], 2nd and 4th Thursday of the month at 6:30am.  10 clients each day by appointment, can sometimes see walk-in patients if someone does not show for an appointment. [facility]- [address] Hillard Danker [place], [place], [address], [place], [place], [place], [address],  [place] Department- [phone] [facility] [phone]

## 2018-03-22 ENCOUNTER — Ambulatory Visit: Payer: Self-pay | Admitting: Internal Medicine

## 2018-03-22 ENCOUNTER — Telehealth: Payer: Self-pay | Admitting: Licensed Clinical Social Worker

## 2018-03-22 ENCOUNTER — Encounter: Payer: Self-pay | Admitting: Internal Medicine

## 2018-03-22 VITALS — BP 200/100 | HR 76 | Resp 12 | Ht 63.0 in | Wt 136.0 lb

## 2018-03-22 DIAGNOSIS — M10372 Gout due to renal impairment, left ankle and foot: Secondary | ICD-10-CM

## 2018-03-22 DIAGNOSIS — Z91199 Patient's noncompliance with other medical treatment and regimen due to unspecified reason: Secondary | ICD-10-CM

## 2018-03-22 DIAGNOSIS — IMO0002 Reserved for concepts with insufficient information to code with codable children: Secondary | ICD-10-CM

## 2018-03-22 DIAGNOSIS — E1165 Type 2 diabetes mellitus with hyperglycemia: Secondary | ICD-10-CM

## 2018-03-22 DIAGNOSIS — E1142 Type 2 diabetes mellitus with diabetic polyneuropathy: Secondary | ICD-10-CM

## 2018-03-22 DIAGNOSIS — E118 Type 2 diabetes mellitus with unspecified complications: Secondary | ICD-10-CM

## 2018-03-22 DIAGNOSIS — Z9119 Patient's noncompliance with other medical treatment and regimen: Secondary | ICD-10-CM

## 2018-03-22 DIAGNOSIS — I150 Renovascular hypertension: Secondary | ICD-10-CM

## 2018-03-22 MED ORDER — GABAPENTIN 100 MG PO CAPS: ORAL_CAPSULE | ORAL | 3 refills | Status: DC

## 2018-03-22 MED ORDER — PREDNISONE 10 MG PO TABS: ORAL_TABLET | ORAL | 0 refills | Status: DC

## 2018-03-22 NOTE — Progress Notes (Signed)
Subjective:    Patient ID: Blake Santana, male    DOB: 04-Mar-1971, 47 y.o.   MRN: [id number]  HPI   Very difficult history  Two problems:  1.  Numbness of both sets of toes for months.  Started in left about 3 months ago.  Has never been on Gabapentin.  Sometimes in left hand.  He is left handed.    2.  Redness, swelling and pain in 1st and 2nd toes of left foot, started 2 weeks ago in web between toes and very painful.  He was not checking his feet nightly, but noted swelling of PIP joints of the same toes for first time 5 days ago.   Went into ED on Sunday, 2 days ago, due to  Xray of left foot small showed area of erosion of medial MTP joint consistent with gout.  Right foot with no swelling or redness, just the numbness above.   MTP joint of the great toe and second toe does not bother him currently.  He does not recall any discomfort of the MTP joint in the past. No fevers or chills.   WBC in ED was 9.0 with normal differential.    Checking sugars only in the morning before breakfast.  Running in general 150-170.  He is not clear why he is not following his diabetes closer.    Hypertension:  Missing his meds.  Went to fill yesterday and were not available, so did not pick up.  Current Meds  Medication Sig  . amLODipine (NORVASC) 5 MG tablet 1 tab by mouth daily in the morning with breakfast  . atorvastatin (LIPITOR) 40 MG tablet 1 tab by mouth daily with evening meal  . Blood Glucose Monitoring Suppl (AGAMATRIX PRESTO) w/Device KIT Check sugars twice daily  . calcium acetate (PHOSLO) 667 MG capsule 2 caps by mouth three times daily with meals  . glipiZIDE (GLUCOTROL) 5 MG tablet 1 tab by mouth twice daily with meals  . glucose blood (AGAMATRIX PRESTO TEST) test strip Check sugars twice daily  . metoprolol tartrate (LOPRESSOR) 25 MG tablet 1/2 tab by mouth twice daily with meals    No Known Allergies  Review of Systems     Objective:   Physical Exam Left foot:   Sausage shaped swelling about IP and PIP joint of left great toe and 2nd toe respectively with hyperpigmentation--appears to be resolving inflammation.  Tender over both joints mildly.  Decreased DP pulses bilaterally.       Assessment & Plan:  1.  Probably gout involving left foot/toes:  Start low dose prednisone 20 mg daily for 3 days, then 10 mg daily for 3 days, then 5 mg daily for 3 days, then stop.   Check Uric Acid.  Once inflammation quiescent, will see about starting Allopurinol.  2.  DM:  Needs to follow sugars regularly with prednisone.  Went over this with son and wife. To call if sugars getting above 200 regularly.  3.  Hypertension:  Needs to take meds.  Not clear why this family is not adequately doing what needs to be done to care for their chronic illnesses.  Long discussion regarding this.  Patient with no input regarding possibility of depression or financial issues.  4.  Peripheral Neuropathy:  Start Gabapentin 100 mg at bedtime and titrate every 3 days up by 100 mg to max of 300 mg at bedtime.  Follow up in 2 weeks.  Called pharmacy:  Has not filled medications since  12/21/2017, so has essentially been without medication if taken appropriately since 01/21/18.

## 2018-03-22 NOTE — Telephone Encounter (Signed)
LCSW attempted to contact patient regarding counseling referral from Dr. Amil Amen -- neither contact number would allow a voicemail.

## 2018-03-23 LAB — URIC ACID: Uric Acid: 5.7 mg/dL (ref 3.7–8.6)

## 2018-03-28 ENCOUNTER — Other Ambulatory Visit: Payer: Self-pay | Admitting: Licensed Clinical Social Worker

## 2018-04-01 ENCOUNTER — Ambulatory Visit: Payer: Self-pay | Admitting: Licensed Clinical Social Worker

## 2018-04-01 DIAGNOSIS — F32A Depression, unspecified: Secondary | ICD-10-CM

## 2018-04-01 DIAGNOSIS — F329 Major depressive disorder, single episode, unspecified: Secondary | ICD-10-CM

## 2018-04-07 ENCOUNTER — Encounter: Payer: Self-pay | Admitting: Internal Medicine

## 2018-04-07 ENCOUNTER — Ambulatory Visit: Payer: Self-pay | Admitting: Internal Medicine

## 2018-04-07 VITALS — BP 180/90 | HR 74 | Resp 12 | Ht 63.0 in | Wt 139.0 lb

## 2018-04-07 DIAGNOSIS — I150 Renovascular hypertension: Secondary | ICD-10-CM

## 2018-04-07 DIAGNOSIS — E1142 Type 2 diabetes mellitus with diabetic polyneuropathy: Secondary | ICD-10-CM

## 2018-04-07 DIAGNOSIS — M1990 Unspecified osteoarthritis, unspecified site: Secondary | ICD-10-CM

## 2018-04-07 NOTE — Progress Notes (Signed)
   THERAPY PROGRESS NOTE  Session Time: 44min  Participation Level: Active  Behavioral Response: Casual and DisheveledAlertDepressed  Type of Therapy: Individual Therapy  Treatment Goals addressed: Coping  Interventions: Motivational Interviewing and Supportive  Summary: Jeromey Kruer is a 47 y.o. male who presents with a depressed mood and appropriate affect. He shared that he was attending the counseling session based on Dr. Melissa Noon recommendation. He acknowledged that he does not always take care of his health conditions like he should. He shared that he does not always check his sugar levels or take his medications regularly. He shared that he has not been able to work due to his worsening health and having to do dialysis three times per week. He was unable to identify the obstacles in checking his sugars and taking medications, only saying that it's "hard to remember." Luka shared that his mother died of diabetes-related complications and that he feels that once a person has diabetes, "their body just starts failing." He reported that his motivation level to take better care of himself is a 7 or 8 out of 10. He stated that his son is his main motivation for trying to get better. He agreed that he could try using a pill organizer and an alarm on his phone to stay on top of his medical tasks. He also shared that he is highly stressed by being 4 months behind on the rent and at high risk of eviction. Kamon decided that he did not want any more counseling sessions at this time.  Suicidal/Homicidal: Nowithout intent/plan  Therapist Response: LCSW utilized supportive counseling techniques throughout the session in order to validate emotions and encourage open expression of emotion. LCSW used Motivational Interviewing techniques such as developing a discrepancy to highlight the difference between Marrion's stated goal and his actual behaviors. LCSW observed that he appeared hopeless about  his medical condition even when stating that he is motivated to take better care of himself. LCSW suggested several practical suggestions for taking medications -- using a pillbox and an alarm on his phone. LCSW committed to completing a referral to Legal Aid for support/help around the eviction issue.  Plan: Return again in 0 weeks.   Metta Clines, LCSW 04/07/2018

## 2018-04-07 NOTE — Patient Instructions (Signed)
Gabapentin: Start taking Gabapentin 100 mg cap 1 cap at bedtime. In 3 days, increase to 2 caps at bedtime. In another 3 days, increase to 3 caps at bedtime You should be taking 3 caps at bedtime at this point.  In 3 days, start another 1 cap in the morning In 3 more days, increase to 2 caps in the morning In 3 more days, increase to 3 caps in the morning:  You should be taking 3 caps in the morning and 3 caps at bedtime at this point.  In 3 days, start another dose midday--1 cap In 3 more days, increase to 2 caps midday In 3 more days, increase to 3 caps midday. You should be taking 3 caps 3 times daily at this point.  Stay on this dose until follow up If you do not tolerate increasing the dose at any point, hold on the dose you tolerate or call clinic if having problems 

## 2018-04-07 NOTE — Progress Notes (Signed)
   Subjective:    Patient ID: Blake Santana, male    DOB: [birth year], 47 y.o.   MRN: [id number]  HPI   1. Toe pain:  Did get improvement with prednisone burst.  Uric Acid level with last visit was normal at 5.7.  Patient with renal failure.    2.  Peripheral Neuropathy:  He states he ran out of gabapentin, but in reality, he is talking about the prednisone.  He never picked up the gabapentin.    3.  Hypertension:  Has 7 days left of Metoprolol that should have been finished yesterday.  His Amlodipine appears to be accurate with date and count.    Current Meds  Medication Sig  . amLODipine (NORVASC) 5 MG tablet 1 tab by mouth daily in the morning with breakfast  . atorvastatin (LIPITOR) 40 MG tablet 1 tab by mouth daily with evening meal  . Blood Glucose Monitoring Suppl (AGAMATRIX PRESTO) w/Device KIT Check sugars twice daily  . calcium acetate (PHOSLO) 667 MG capsule 2 caps by mouth three times daily with meals  . glipiZIDE (GLUCOTROL) 5 MG tablet 1 tab by mouth twice daily with meals  . glucose blood (AGAMATRIX PRESTO TEST) test strip Check sugars twice daily  . lidocaine-prilocaine (EMLA) cream Apply 1 application topically 3 (three) times a week. Tuesday, Thursday and Saturday  . metoprolol tartrate (LOPRESSOR) 25 MG tablet 1/2 tab by mouth twice daily with meals    No Known Allergies  Review of Systems     Objective:   Physical Exam  Chronically ill appearing Lungs:  CTA CV:  RRR without murmur or rub LE:  Left great and 2nd toe with mild sausage appearance and hyperpigmentation, mainly affected at PIP joint.  Able to move without pain.      Assessment & Plan:  1.  Gouty attack of left toes vs inflammatory arthritis?:  In patient with renal failure. Uric acid not elevated.  Did have erosive changes of first MTP on xray consistent with gout. Check ANA, RF.  Hold on Allopurinol at this time.  2.  Peripheral Neuropathy:  Went over use of Gabapentin and dosing again.   He and his family seem to understand.    3.  Hypertension:  Again, missing meds.  Has met with Macie Burows, LCSW for possible depression, but was not interested in continuing with counseling.   Several suggestions given to work on organizing medication.  He has not instituted anything yet. Did not make contact with Dr. Arty Baumgartner regarding bp meds.

## 2018-04-08 LAB — ANA: Anti Nuclear Antibody(ANA): NEGATIVE

## 2018-04-08 LAB — RHEUMATOID FACTOR: Rhuematoid fact SerPl-aCnc: <10 IU/mL (ref 0.0–13.9)

## 2018-05-08 ENCOUNTER — Encounter: Payer: Self-pay | Admitting: Internal Medicine

## 2018-05-11 ENCOUNTER — Other Ambulatory Visit: Payer: Self-pay | Admitting: *Deleted

## 2018-05-11 DIAGNOSIS — T82510D Breakdown (mechanical) of surgically created arteriovenous fistula, subsequent encounter: Secondary | ICD-10-CM

## 2018-05-11 DIAGNOSIS — N186 End stage renal disease: Secondary | ICD-10-CM

## 2018-05-16 ENCOUNTER — Ambulatory Visit: Payer: Self-pay | Admitting: Internal Medicine

## 2018-05-16 ENCOUNTER — Encounter: Payer: Self-pay | Admitting: Internal Medicine

## 2018-05-16 VITALS — BP 150/90 | HR 76 | Resp 12 | Ht 63.0 in | Wt 140.0 lb

## 2018-05-16 DIAGNOSIS — IMO0002 Reserved for concepts with insufficient information to code with codable children: Secondary | ICD-10-CM

## 2018-05-16 DIAGNOSIS — E118 Type 2 diabetes mellitus with unspecified complications: Secondary | ICD-10-CM

## 2018-05-16 DIAGNOSIS — E785 Hyperlipidemia, unspecified: Secondary | ICD-10-CM

## 2018-05-16 DIAGNOSIS — E1165 Type 2 diabetes mellitus with hyperglycemia: Secondary | ICD-10-CM

## 2018-05-16 DIAGNOSIS — N186 End stage renal disease: Secondary | ICD-10-CM

## 2018-05-16 DIAGNOSIS — I150 Renovascular hypertension: Secondary | ICD-10-CM

## 2018-05-16 DIAGNOSIS — E1142 Type 2 diabetes mellitus with diabetic polyneuropathy: Secondary | ICD-10-CM

## 2018-05-16 DIAGNOSIS — E113313 Type 2 diabetes mellitus with moderate nonproliferative diabetic retinopathy with macular edema, bilateral: Secondary | ICD-10-CM

## 2018-05-16 MED ORDER — ROSUVASTATIN CALCIUM 10 MG PO TABS: ORAL_TABLET | ORAL | 11 refills | Status: DC

## 2018-05-16 MED ORDER — GABAPENTIN 300 MG PO CAPS: ORAL_CAPSULE | ORAL | 11 refills | Status: DC

## 2018-05-16 NOTE — Progress Notes (Signed)
Subjective:    Patient ID: Blake Santana, male    DOB: November 28, 1970, 47 y.o.   MRN: [id number]  HPI   1.  Peripheral Neuropathy.  States he is now taking Gabapentin.   States he is taking 3 caps or 300 mg at bedtime, but his pill box has several days with only 2 caps.   Unable to ascertain what has transpired with his Gabapentin.   States the pain in his feet is decreasing.  Sometimes he has foot pain, but mild.    2.  Hypertension:  Have phone message into [place], Dr. Birdie Riddle assistant regarding difficulty controlling his bp and whether he is truly dropping his bp with each episode of dialysis.  States not as bad as previously.   Have not been able to get his bp down adequately with bp meds, Metoprolol and Amlodipine.  3.  Dyslipidemia:  Feels Atorvastatin is causing his diarrhea.  He took himself off and diarrhea resolved.  He went back on and diarrhea returned.  He has stopped and feels better.  No diarrhea even when his bp drops with dialysis. He would be willing to try another statin.  4.  DM:  Taking Glipizide.  Brings in glucoses. Sugars only being checked in mornings and good throughout September, but getting high this month.  Sugar up to 200 on the 10th.  At pizza earlier on that day. Not physically active--walks in back yard.   Family eating a lot of fast food:  [place] and [place].   Did get flu vaccine at dialysis last week. He did get eyes checked 8 days ago, was told he has inflammation and needs injections.    Dr. Iona Hansen.  Current Meds  Medication Sig  . amLODipine (NORVASC) 5 MG tablet 1 tab by mouth daily in the morning with breakfast  . atorvastatin (LIPITOR) 40 MG tablet 1 tab by mouth daily with evening meal  . Blood Glucose Monitoring Suppl (AGAMATRIX PRESTO) w/Device KIT Check sugars twice daily  . calcium acetate (PHOSLO) 667 MG capsule 2 caps by mouth three times daily with meals  . gabapentin (NEURONTIN) 100 MG capsule 1 cap by mouth at bedtime, in  3 days increase to 2 caps daily at bedtime, in 3 more days 3 caps at bedtime and remain on this dose.  Marland Kitchen glipiZIDE (GLUCOTROL) 5 MG tablet 1 tab by mouth twice daily with meals  . glucose blood (AGAMATRIX PRESTO TEST) test strip Check sugars twice daily  . lidocaine-prilocaine (EMLA) cream Apply 1 application topically 3 (three) times a week. Tuesday, Thursday and Saturday  . metoprolol tartrate (LOPRESSOR) 25 MG tablet 1/2 tab by mouth twice daily with meals   No Known Allergies   Review of Systems     Objective:   Physical Exam Chronically ill appearing.  Has a double port in right chest/  Bruising in areas overlying new AV fistula in left upper arm.  Good bruit.  Still with the left forearm AV fistula as well.  Lungs:  CTA CV:  RRR without murmur or rub.  No radial pulses, left wrist. Abd:  S, NT, No HSM or mass, + BS LE:  No edema.  Left second toe now without any residual swelling.       Assessment & Plan:  1.  Diabetic peripheral neuropathy:  Sounds like fairly well controlled with 300 mg Gabapentin at bedtime.  Change to 300 mg cap.  2.  DM: eye disease being treated by Dr. Iona Hansen.  A1C today.  Appears was controlled well last month, but this month, sugars significantly higher.   Have asked him to be the meal preparer at their home as his wife had DM not as well controlled recently as well and his children need to develop better eating and physical activity habits.  3.  Dyslipidemia:  Cancel FLP today as he stopped his Atorvastatin, not clear how long agp Start Rosuvastatin and see if tolerates without diarrhea, etc. FLP/hepatic profile in 6 weeks.    4. Hypertension:  Call into [place], Dr. Birdie Riddle assistant, regarding his poor bp control with low bps during dialysis and plan.

## 2018-05-17 LAB — HGB A1C W/O EAG: Hgb A1c MFr Bld: 8 % — ABNORMAL HIGH (ref 4.8–5.6)

## 2018-05-23 ENCOUNTER — Encounter: Payer: Self-pay | Admitting: Vascular Surgery

## 2018-05-23 ENCOUNTER — Encounter (HOSPITAL_COMMUNITY): Payer: Self-pay

## 2018-05-23 ENCOUNTER — Ambulatory Visit: Payer: Self-pay

## 2018-06-01 ENCOUNTER — Ambulatory Visit (HOSPITAL_COMMUNITY): Admission: RE | Admit: 2018-06-01 | Payer: Self-pay | Source: Ambulatory Visit

## 2018-06-01 ENCOUNTER — Ambulatory Visit: Payer: Self-pay

## 2018-06-01 NOTE — Progress Notes (Deleted)
HISTORY AND PHYSICAL     CC:  *** Requesting Provider:  Mack Hook, MD  HPI: This is a 47 y.o. male who underwent left upper arm AV fistula creation on 02/02/18 by Dr. Donnetta Hutching.  He is referred today as they are having difficulty cannulating his fistula.    He had a follow up appointment in August, but did not come.  He had f/u appointment on October 21, but this appointment was cancelled.   Past Medical History:  Diagnosis Date  . Anemia of chronic kidney failure 03/11/2017  . Chronic kidney disease 03/11/2017  . Diabetes mellitus without complication ([place]) 9233  . Edema 01/14/2017  . Headache   . Hypercholesterolemia   . Hypertension 03/11/2017  . Iron deficiency anemia 06/18/2016  . RSV (respiratory syncytial virus infection) 08/2017    Past Surgical History:  Procedure Laterality Date  . A/V FISTULAGRAM Left 01/19/2018   Procedure: A/V FISTULAGRAM - Left AV;  Surgeon: Waynetta Sandy, MD;  Location: [facility];  Service: Cardiovascular;  Laterality: Left;  . AV FISTULA PLACEMENT Left 08/09/2017   Procedure: LEFT RADIOCEPHALIC ARTERIOVENOUS (AV) FISTULA CREATION;  Surgeon: [name], MD;  Location[facility];  Service: Vascular;  Laterality: Left;  . AV FISTULA PLACEMENT Left 02/02/2018   Procedure: ARTERIOVENOUS (AV) FISTULA CREATION LEFT UPPER EXTREMITY;  Surgeon: [name], MD;  Location: [facility];  Service: Vascular;  Laterality: Left;  . INSERTION OF DIALYSIS CATHETER  08/09/2017  . INSERTION OF DIALYSIS CATHETER Right 08/09/2017   Procedure: INSERTION OF DIALYSIS CATHETER RIGHT INTERNAL JUGULAR;  Surgeon: [name], MD;  Location: [facility] OR;  Service: Vascular;  Laterality: Right;    No Known Allergies  Current Outpatient Medications  Medication Sig Dispense Refill  . amLODipine (NORVASC) 5 MG tablet 1 tab by mouth daily in the morning with breakfast 30 tablet 11  . Blood Glucose Monitoring Suppl (AGAMATRIX PRESTO) w/Device KIT Check sugars twice daily 1  kit 0  . calcium acetate (PHOSLO) 667 MG capsule 2 caps by mouth three times daily with meals    . gabapentin (NEURONTIN) 300 MG capsule 1 cap by mouth at bedtime. 30 capsule 11  . glipiZIDE (GLUCOTROL) 5 MG tablet 1 tab by mouth twice daily with meals 60 tablet 11  . glucose blood (AGAMATRIX PRESTO TEST) test strip Check sugars twice daily 100 each 12  . metoprolol tartrate (LOPRESSOR) 25 MG tablet 1/2 tab by mouth twice daily with meals 30 tablet 11  . rosuvastatin (CRESTOR) 10 MG tablet 1 tab by mouth daily with evening meal 90 tablet 11   No current facility-administered medications for this visit.     Family History  Problem Relation Age of Onset  . Diabetes Mother   . Kidney disease Mother        renal failure--diabetes, cause of death  . Hypertension Mother   . Diabetes Father   . Kidney disease Father        Cause of death:  kidney failure form diabetes  . Alcohol abuse Father   . Diabetes Sister     Social History   Socioeconomic History  . Marital status: Married    Spouse name: [name]  . Number of children: 1  . Years of education: 80  . Highest education level: Not on file  Occupational History  . Occupation: [employer]  . Financial resource strain: Not on file  . Food insecurity:    Worry: Not on file  Inability: Not on file  . Transportation needs:    Medical: Not on file    Non-medical: Not on file  Tobacco Use  . Smoking status: Former Smoker    Types: Cigarettes  . Smokeless tobacco: Never Used  Substance and Sexual Activity  . Alcohol use: Not Currently  . Drug use: No  . Sexual activity: Not on file  Lifestyle  . Physical activity:    Days per week: Not on file    Minutes per session: Not on file  . Stress: Not on file  Relationships  . Social connections:    Talks on phone: Not on file    Gets together: Not on file    Attends religious service: Not on file    Active member of club or organization: Not on file     Attends meetings of clubs or organizations: Not on file    Relationship status: Not on file  . Intimate partner violence:    Fear of current or ex partner: Not on file    Emotionally abused: Not on file    Physically abused: Not on file    Forced sexual activity: Not on file  Other Topics Concern  . Not on file  Social History Narrative   Originally from [place]   Came to [employer]. In [place] with his girlfriend and their son.     REVIEW OF SYSTEMS:  *** '[X]'  denotes positive finding, '[ ]'  denotes negative finding Cardiac  Comments:  Chest pain or chest pressure:    Shortness of breath upon exertion:    Short of breath when lying flat:    Irregular heart rhythm:        Vascular    Pain in calf, thigh, or hip brought on by ambulation:    Pain in feet at night that wakes you up from your sleep:     Blood clot in your veins:    Leg swelling:         Pulmonary    Oxygen at home:    Productive cough:     Wheezing:         Neurologic    Sudden weakness in arms or legs:     Sudden numbness in arms or legs:     Sudden onset of difficulty speaking or slurred speech:    Temporary loss of vision in one eye:     Problems with dizziness:         Gastrointestinal    Blood in stool:     Vomited blood:         Genitourinary    Burning when urinating:     Blood in urine:        Psychiatric    Major depression:         Hematologic    Bleeding problems:    Problems with blood clotting too easily:        Skin    Rashes or ulcers:        Constitutional    Fever or chills:      PHYSICAL EXAMINATION:  ***   General:  WDWN in NAD; vital signs documented above Gait: Not observed HENT: WNL, normocephalic Pulmonary: normal non-labored breathing , without Rales, rhonchi,  wheezing Cardiac: {Desc; regular/irreg:14544} HR, without  Murmurs {With/Without:20273} carotid bruit*** Abdomen: soft, NT, no masses Skin: {With/Without:20273} rashes Vascular Exam/Pulses:  Right Left   Radial {Exam; arterial pulse strength 0-4:30167} {Exam; arterial pulse strength 0-4:30167}  Ulnar {Exam;  arterial pulse strength 0-4:30167} {Exam; arterial pulse strength 0-4:30167}  Brachial {Exam; arterial pulse strength 0-4:30167} {Exam; arterial pulse strength 0-4:30167}  Femoral {Exam; arterial pulse strength 0-4:30167} {Exam; arterial pulse strength 0-4:30167}  Popliteal {Exam; arterial pulse strength 0-4:30167} {Exam; arterial pulse strength 0-4:30167}  DP {Exam; arterial pulse strength 0-4:30167} {Exam; arterial pulse strength 0-4:30167}  PT {Exam; arterial pulse strength 0-4:30167} {Exam; arterial pulse strength 0-4:30167}  Peroneal {Exam; arterial pulse strength 0-4:30167} {Exam; arterial pulse strength 0-4:30167}   Extremities: {With/Without:20273} ischemic changes, {With/Without:20273} Gangrene , {With/Without:20273} cellulitis; {With/Without:20273} open wounds;  Musculoskeletal: no muscle wasting or atrophy  Neurologic: A&O X 3;  No focal weakness or paresthesias are detected Psychiatric:  The pt has {Desc; normal/abnormal:11317::"Normal"} affect.   Non-Invasive Vascular Imaging:   ***  Pt meds includes: Statin:  {yes no:314532} Beta Blocker:  {yes no:314532} Aspirin:  {yes no:314532} ACEI:  {yes no:314532} ARB:  {yes no:314532} CCB use:  {yes/no:20286} Other Antiplatelet/Anticoagulant:  {yes/no:20286}***   ASSESSMENT/PLAN:: 47 y.o. male left upper arm AV fistula creation on 02/02/18 by Dr. Donnetta Hutching.   -***   Leontine Locket, PA-C [facility] [phone]  Clinic MD:  Pt seen and examined with Dr. Marland Kitchen

## 2018-06-02 ENCOUNTER — Encounter: Payer: Self-pay | Admitting: General Practice

## 2018-06-23 ENCOUNTER — Other Ambulatory Visit (HOSPITAL_COMMUNITY): Payer: Self-pay | Admitting: Nephrology

## 2018-06-23 DIAGNOSIS — N186 End stage renal disease: Secondary | ICD-10-CM

## 2018-06-27 ENCOUNTER — Other Ambulatory Visit: Payer: Self-pay

## 2018-06-27 DIAGNOSIS — E785 Hyperlipidemia, unspecified: Secondary | ICD-10-CM

## 2018-06-27 DIAGNOSIS — Z79899 Other long term (current) drug therapy: Secondary | ICD-10-CM

## 2018-06-28 LAB — LIPID PANEL W/O CHOL/HDL RATIO
Cholesterol, Total: 129 mg/dL (ref 100–199)
HDL: 43 mg/dL (ref >39)
LDL Calculated: 60 mg/dL (ref 0–99)
TRIGLYCERIDES: 130 mg/dL (ref 0–149)
VLDL CHOLESTEROL CAL: 26 mg/dL (ref 5–40)

## 2018-06-28 LAB — HEPATIC FUNCTION PANEL
ALT: 12 IU/L (ref 0–44)
AST: 15 IU/L (ref 0–40)
Albumin: 4.2 g/dL (ref 3.5–5.5)
Alkaline Phosphatase: 70 IU/L (ref 39–117)
BILIRUBIN TOTAL: 0.3 mg/dL (ref 0.0–1.2)
Bilirubin, Direct: 0.09 mg/dL (ref 0.00–0.40)
Total Protein: 7.3 g/dL (ref 6.0–8.5)

## 2018-07-01 ENCOUNTER — Ambulatory Visit (HOSPITAL_COMMUNITY)
Admission: RE | Admit: 2018-07-01 | Discharge: 2018-07-01 | Disposition: A | Discharge status: Home / Self Care | Payer: Self-pay | Source: Ambulatory Visit | Attending: Nephrology | Admitting: Nephrology

## 2018-07-01 ENCOUNTER — Encounter (HOSPITAL_COMMUNITY): Payer: Self-pay | Admitting: Physician Assistant

## 2018-07-01 DIAGNOSIS — Z992 Dependence on renal dialysis: Secondary | ICD-10-CM | POA: Insufficient documentation

## 2018-07-01 DIAGNOSIS — N186 End stage renal disease: Secondary | ICD-10-CM | POA: Insufficient documentation

## 2018-07-01 DIAGNOSIS — Z452 Encounter for adjustment and management of vascular access device: Secondary | ICD-10-CM | POA: Insufficient documentation

## 2018-07-01 HISTORY — PX: IR REMOVAL TUN CV CATH W/O FL: IMG2289

## 2018-07-01 MED ORDER — LIDOCAINE HCL 1 % IJ SOLN
INTRAMUSCULAR | Status: AC
  Filled 2018-07-01: qty 20

## 2018-07-01 MED ORDER — CHLORHEXIDINE GLUCONATE 4 % EX LIQD
CUTANEOUS | Status: AC
  Filled 2018-07-01: qty 15

## 2018-07-01 MED ORDER — LIDOCAINE HCL 1 % IJ SOLN
INTRAMUSCULAR | Status: DC | PRN
  Administered 2018-07-01: 10 mL

## 2018-07-01 NOTE — Procedures (Signed)
Pre procedural Dx: ESRD Post procedural Dx: Same  Successful removal of tunneled HD catheter  EBL: None No immediate complications.  Please see imaging section of Epic for full dictation.  Candiss Norse, PA-C

## 2018-07-13 ENCOUNTER — Telehealth: Payer: Self-pay | Admitting: Internal Medicine

## 2018-07-13 MED ORDER — OSELTAMIVIR PHOSPHATE 75 MG PO CAPS: ORAL_CAPSULE | ORAL | 1 refills | Status: DC

## 2018-07-13 NOTE — Telephone Encounter (Signed)
Son diagnosed with influenza.  Needs prophylaxis

## 2018-08-17 ENCOUNTER — Encounter: Payer: Self-pay | Admitting: Internal Medicine

## 2018-08-17 ENCOUNTER — Ambulatory Visit: Payer: Self-pay | Admitting: Internal Medicine

## 2018-08-17 VITALS — BP 180/100 | HR 70 | Resp 12 | Ht 63.0 in | Wt 144.0 lb

## 2018-08-17 DIAGNOSIS — I10 Essential (primary) hypertension: Secondary | ICD-10-CM

## 2018-08-17 DIAGNOSIS — M79675 Pain in left toe(s): Secondary | ICD-10-CM

## 2018-08-17 DIAGNOSIS — E118 Type 2 diabetes mellitus with unspecified complications: Secondary | ICD-10-CM

## 2018-08-17 DIAGNOSIS — E1165 Type 2 diabetes mellitus with hyperglycemia: Secondary | ICD-10-CM

## 2018-08-17 DIAGNOSIS — N186 End stage renal disease: Secondary | ICD-10-CM

## 2018-08-17 DIAGNOSIS — E785 Hyperlipidemia, unspecified: Secondary | ICD-10-CM

## 2018-08-17 DIAGNOSIS — IMO0002 Reserved for concepts with insufficient information to code with codable children: Secondary | ICD-10-CM

## 2018-08-17 MED ORDER — CETIRIZINE HCL 10 MG PO TABS: ORAL_TABLET | ORAL | 11 refills | Status: DC

## 2018-08-17 NOTE — Progress Notes (Signed)
   Subjective:   Patient ID: Blake Santana, male    DOB: [birth year], 48 y.o.   MRN: [id number]  HPI  1.  Hypertension:  He is told his bp is low at dialysis.  He gets light headed and feels like he will pass out.   Goes to [employer] on [address] Followed by Dr. Arty Baumgartner, nephrology   2.  DM:  Checking sugars only in the morning fasting.  Generally low to mid 100s.   He had one at 235 that was during the [place] and recognizes he ate out of the ordinary. Temporary dialysis catheter removed from right chest in November. Checking feet nightly. Left great toenail fell off.   3.Onychomycosis of fingernails and toenails.  All are better now except his left thumbnail.  Never able to finish Terbinafine as Renal was concerned about this.  Never heard back as to whether he could restart and he decided not to take as all nails were improving  4.  Left toe pain:  Pain on pad.  Feet feel dry on plantar aspect  5.  Episodes of weakness--hands and feet shake.  Has never checked blood sugar when this happens.  6. Chronic cough:  Is having sneezing, posterior pharyngeal drainage.  Puppy in house, but got him after cough started months ago.   Eyes and nose can itch and run. Review of Systems    Objective:  Physical Exam  Chronically ill appearing-pale/gray coloration HEENT:  Acneiform lesions at hairline, behind ears.  PERRL, EOMI, conjunctivae mildly injected bilaterally.  TMs pearly gray.  Nasal mucosa with swelling and clear discharge.  Posterior pharynx with cobbling. Neck:  Supple, No adenopathy Chest:  CTA CV:  RRR without murmur or rub.   Abd:  S, NT, No HSM or mass, + BS LE:  Trace edema.  Decreased DP/PT pulses . Left foot great toe with tenderness on plantar pad.  No erythema or swelling.  2nd toe with hyperpigmentation from previous swelling around PIP.  Mildly tender.  Plantar feet with flaking and thickening of skin.      Assessment & Plan:  1.  Hypertension:   Treatment continues to be limited due to low blood pressures with dialysis.  2.  Dyslipidemia:  FLP with fasting labs in next week.    3.  DM:  A1C with fasting labs.  CMP as well.  4.  Toe pain, first two toes on left:  Podiatry referral.  For flaking, to use Tea Tree oil mixed with Gold [place] Foot cream twice daily.    5.  Allergies:  Cetirizine 10 mg daily.  6.  Episodes of shaking and weakness:  Check blood glucose when feel this way and eat appropriately if low.

## 2018-08-17 NOTE — Patient Instructions (Signed)
Tea Tree Oil  And Gold Foot cream mixed together and massage in to feet every night.

## 2018-08-26 ENCOUNTER — Other Ambulatory Visit: Payer: Self-pay

## 2018-08-26 DIAGNOSIS — E1165 Type 2 diabetes mellitus with hyperglycemia: Secondary | ICD-10-CM

## 2018-08-26 DIAGNOSIS — E118 Type 2 diabetes mellitus with unspecified complications: Secondary | ICD-10-CM

## 2018-08-26 DIAGNOSIS — IMO0002 Reserved for concepts with insufficient information to code with codable children: Secondary | ICD-10-CM

## 2018-08-26 DIAGNOSIS — Z79899 Other long term (current) drug therapy: Secondary | ICD-10-CM

## 2018-08-26 DIAGNOSIS — E785 Hyperlipidemia, unspecified: Secondary | ICD-10-CM

## 2018-08-27 LAB — COMPREHENSIVE METABOLIC PANEL
A/G RATIO: 1.2 (ref 1.2–2.2)
ALT: 12 IU/L (ref 0–44)
AST: 10 IU/L (ref 0–40)
Albumin: 4.1 g/dL (ref 4.0–5.0)
Alkaline Phosphatase: 83 IU/L (ref 39–117)
BUN / CREAT RATIO: 4 — AB (ref 9–20)
BUN: 28 mg/dL — ABNORMAL HIGH (ref 6–24)
Bilirubin Total: 0.3 mg/dL (ref 0.0–1.2)
CALCIUM: 9.4 mg/dL (ref 8.7–10.2)
CO2: 26 mmol/L (ref 20–29)
CREATININE: 7.03 mg/dL — AB (ref 0.76–1.27)
Chloride: 104 mmol/L (ref 96–106)
GFR, EST AFRICAN AMERICAN: 10 mL/min/{1.73_m2} — AB (ref >59)
GFR, EST NON AFRICAN AMERICAN: 8 mL/min/{1.73_m2} — AB (ref >59)
Globulin, Total: 3.3 g/dL (ref 1.5–4.5)
Glucose: 131 mg/dL — ABNORMAL HIGH (ref 65–99)
POTASSIUM: 6.1 mmol/L — AB (ref 3.5–5.2)
SODIUM: 144 mmol/L (ref 134–144)
Total Protein: 7.4 g/dL (ref 6.0–8.5)

## 2018-08-27 LAB — LIPID PANEL W/O CHOL/HDL RATIO
CHOLESTEROL TOTAL: 131 mg/dL (ref 100–199)
HDL: 43 mg/dL (ref >39)
LDL CALC: 65 mg/dL (ref 0–99)
Triglycerides: 113 mg/dL (ref 0–149)
VLDL CHOLESTEROL CAL: 23 mg/dL (ref 5–40)

## 2018-08-27 LAB — HGB A1C W/O EAG: HEMOGLOBIN A1C: 7.4 % — AB (ref 4.8–5.6)

## 2018-09-01 ENCOUNTER — Encounter (HOSPITAL_COMMUNITY): Payer: Self-pay | Admitting: Emergency Medicine

## 2018-09-01 ENCOUNTER — Emergency Department (HOSPITAL_COMMUNITY): Payer: Medicaid Other

## 2018-09-01 ENCOUNTER — Inpatient Hospital Stay (HOSPITAL_COMMUNITY)
Admission: EM | Admit: 2018-09-01 | Discharge: 2018-09-07 | DRG: 193 | Disposition: A | Discharge status: Home / Self Care | Payer: Medicaid Other | Attending: Family Medicine | Admitting: Family Medicine

## 2018-09-01 DIAGNOSIS — E1142 Type 2 diabetes mellitus with diabetic polyneuropathy: Secondary | ICD-10-CM | POA: Diagnosis present

## 2018-09-01 DIAGNOSIS — Z87891 Personal history of nicotine dependence: Secondary | ICD-10-CM | POA: Diagnosis not present

## 2018-09-01 DIAGNOSIS — E785 Hyperlipidemia, unspecified: Secondary | ICD-10-CM | POA: Diagnosis present

## 2018-09-01 DIAGNOSIS — I4581 Long QT syndrome: Secondary | ICD-10-CM | POA: Diagnosis present

## 2018-09-01 DIAGNOSIS — I248 Other forms of acute ischemic heart disease: Secondary | ICD-10-CM | POA: Diagnosis present

## 2018-09-01 DIAGNOSIS — R0789 Other chest pain: Secondary | ICD-10-CM | POA: Diagnosis present

## 2018-09-01 DIAGNOSIS — Z8249 Family history of ischemic heart disease and other diseases of the circulatory system: Secondary | ICD-10-CM

## 2018-09-01 DIAGNOSIS — I340 Nonrheumatic mitral (valve) insufficiency: Secondary | ICD-10-CM | POA: Diagnosis not present

## 2018-09-01 DIAGNOSIS — Z79899 Other long term (current) drug therapy: Secondary | ICD-10-CM

## 2018-09-01 DIAGNOSIS — R778 Other specified abnormalities of plasma proteins: Secondary | ICD-10-CM

## 2018-09-01 DIAGNOSIS — I953 Hypotension of hemodialysis: Secondary | ICD-10-CM | POA: Diagnosis not present

## 2018-09-01 DIAGNOSIS — D631 Anemia in chronic kidney disease: Secondary | ICD-10-CM | POA: Diagnosis present

## 2018-09-01 DIAGNOSIS — J90 Pleural effusion, not elsewhere classified: Secondary | ICD-10-CM | POA: Diagnosis present

## 2018-09-01 DIAGNOSIS — J181 Lobar pneumonia, unspecified organism: Secondary | ICD-10-CM | POA: Diagnosis not present

## 2018-09-01 DIAGNOSIS — N189 Chronic kidney disease, unspecified: Secondary | ICD-10-CM

## 2018-09-01 DIAGNOSIS — E11319 Type 2 diabetes mellitus with unspecified diabetic retinopathy without macular edema: Secondary | ICD-10-CM | POA: Diagnosis present

## 2018-09-01 DIAGNOSIS — N186 End stage renal disease: Secondary | ICD-10-CM | POA: Diagnosis present

## 2018-09-01 DIAGNOSIS — Z833 Family history of diabetes mellitus: Secondary | ICD-10-CM | POA: Diagnosis not present

## 2018-09-01 DIAGNOSIS — I5033 Acute on chronic diastolic (congestive) heart failure: Secondary | ICD-10-CM | POA: Diagnosis present

## 2018-09-01 DIAGNOSIS — D509 Iron deficiency anemia, unspecified: Secondary | ICD-10-CM | POA: Diagnosis present

## 2018-09-01 DIAGNOSIS — I132 Hypertensive heart and chronic kidney disease with heart failure and with stage 5 chronic kidney disease, or end stage renal disease: Secondary | ICD-10-CM | POA: Diagnosis present

## 2018-09-01 DIAGNOSIS — I361 Nonrheumatic tricuspid (valve) insufficiency: Secondary | ICD-10-CM | POA: Diagnosis not present

## 2018-09-01 DIAGNOSIS — J9601 Acute respiratory failure with hypoxia: Secondary | ICD-10-CM | POA: Diagnosis present

## 2018-09-01 DIAGNOSIS — I444 Left anterior fascicular block: Secondary | ICD-10-CM | POA: Diagnosis present

## 2018-09-01 DIAGNOSIS — I313 Pericardial effusion (noninflammatory): Secondary | ICD-10-CM | POA: Diagnosis present

## 2018-09-01 DIAGNOSIS — N2581 Secondary hyperparathyroidism of renal origin: Secondary | ICD-10-CM | POA: Diagnosis present

## 2018-09-01 DIAGNOSIS — Z992 Dependence on renal dialysis: Secondary | ICD-10-CM

## 2018-09-01 DIAGNOSIS — I5043 Acute on chronic combined systolic (congestive) and diastolic (congestive) heart failure: Secondary | ICD-10-CM | POA: Diagnosis not present

## 2018-09-01 DIAGNOSIS — E118 Type 2 diabetes mellitus with unspecified complications: Secondary | ICD-10-CM

## 2018-09-01 DIAGNOSIS — I081 Rheumatic disorders of both mitral and tricuspid valves: Secondary | ICD-10-CM | POA: Diagnosis present

## 2018-09-01 DIAGNOSIS — R9431 Abnormal electrocardiogram [ECG] [EKG]: Secondary | ICD-10-CM

## 2018-09-01 DIAGNOSIS — Z7984 Long term (current) use of oral hypoglycemic drugs: Secondary | ICD-10-CM

## 2018-09-01 DIAGNOSIS — I10 Essential (primary) hypertension: Secondary | ICD-10-CM | POA: Diagnosis not present

## 2018-09-01 DIAGNOSIS — R079 Chest pain, unspecified: Secondary | ICD-10-CM | POA: Diagnosis present

## 2018-09-01 DIAGNOSIS — E7800 Pure hypercholesterolemia, unspecified: Secondary | ICD-10-CM

## 2018-09-01 DIAGNOSIS — E1122 Type 2 diabetes mellitus with diabetic chronic kidney disease: Secondary | ICD-10-CM | POA: Diagnosis present

## 2018-09-01 DIAGNOSIS — J810 Acute pulmonary edema: Secondary | ICD-10-CM | POA: Diagnosis not present

## 2018-09-01 DIAGNOSIS — D649 Anemia, unspecified: Secondary | ICD-10-CM

## 2018-09-01 DIAGNOSIS — E8779 Other fluid overload: Secondary | ICD-10-CM | POA: Diagnosis not present

## 2018-09-01 DIAGNOSIS — R7989 Other specified abnormal findings of blood chemistry: Secondary | ICD-10-CM | POA: Diagnosis not present

## 2018-09-01 DIAGNOSIS — I509 Heart failure, unspecified: Secondary | ICD-10-CM

## 2018-09-01 DIAGNOSIS — J189 Pneumonia, unspecified organism: Principal | ICD-10-CM

## 2018-09-01 DIAGNOSIS — Z811 Family history of alcohol abuse and dependence: Secondary | ICD-10-CM

## 2018-09-01 DIAGNOSIS — Z841 Family history of disorders of kidney and ureter: Secondary | ICD-10-CM

## 2018-09-01 DIAGNOSIS — E875 Hyperkalemia: Secondary | ICD-10-CM | POA: Diagnosis present

## 2018-09-01 DIAGNOSIS — R0902 Hypoxemia: Secondary | ICD-10-CM

## 2018-09-01 DIAGNOSIS — E440 Moderate protein-calorie malnutrition: Secondary | ICD-10-CM | POA: Diagnosis present

## 2018-09-01 DIAGNOSIS — E1129 Type 2 diabetes mellitus with other diabetic kidney complication: Secondary | ICD-10-CM

## 2018-09-01 DIAGNOSIS — E8770 Fluid overload, unspecified: Secondary | ICD-10-CM | POA: Diagnosis present

## 2018-09-01 HISTORY — DX: Acute respiratory failure with hypoxia: J96.01

## 2018-09-01 HISTORY — DX: Heart failure, unspecified: I50.9

## 2018-09-01 HISTORY — DX: Chest pain, unspecified: R07.9

## 2018-09-01 HISTORY — DX: Pneumonia, unspecified organism: J18.9

## 2018-09-01 LAB — COMPREHENSIVE METABOLIC PANEL
ALT: 14 U/L (ref 0–44)
AST: 16 U/L (ref 15–41)
Albumin: 3.3 g/dL — ABNORMAL LOW (ref 3.5–5.0)
Alkaline Phosphatase: 63 U/L (ref 38–126)
Anion gap: 11 (ref 5–15)
BUN: 66 mg/dL — ABNORMAL HIGH (ref 6–20)
CO2: 26 mmol/L (ref 22–32)
Calcium: 9.1 mg/dL (ref 8.9–10.3)
Chloride: 99 mmol/L (ref 98–111)
Creatinine, Ser: 9.99 mg/dL — ABNORMAL HIGH (ref 0.61–1.24)
GFR calc Af Amer: 6 mL/min — ABNORMAL LOW (ref >60)
GFR calc non Af Amer: 5 mL/min — ABNORMAL LOW (ref >60)
Glucose, Bld: 184 mg/dL — ABNORMAL HIGH (ref 70–99)
POTASSIUM: 6.1 mmol/L — AB (ref 3.5–5.1)
Sodium: 136 mmol/L (ref 135–145)
TOTAL PROTEIN: 6.8 g/dL (ref 6.5–8.1)
Total Bilirubin: 0.7 mg/dL (ref 0.3–1.2)

## 2018-09-01 LAB — TROPONIN I
Troponin I: 1.91 ng/mL (ref <0.03)
Troponin I: 2.45 ng/mL (ref <0.03)

## 2018-09-01 LAB — RENAL FUNCTION PANEL
Albumin: 3.2 g/dL — ABNORMAL LOW (ref 3.5–5.0)
Anion gap: 15 (ref 5–15)
BUN: 70 mg/dL — ABNORMAL HIGH (ref 6–20)
CO2: 26 mmol/L (ref 22–32)
Calcium: 9.8 mg/dL (ref 8.9–10.3)
Chloride: 94 mmol/L — ABNORMAL LOW (ref 98–111)
Creatinine, Ser: 10.48 mg/dL — ABNORMAL HIGH (ref 0.61–1.24)
GFR calc Af Amer: 6 mL/min — ABNORMAL LOW (ref >60)
GFR calc non Af Amer: 5 mL/min — ABNORMAL LOW (ref >60)
Glucose, Bld: 188 mg/dL — ABNORMAL HIGH (ref 70–99)
Phosphorus: 3.5 mg/dL (ref 2.5–4.6)
Potassium: 5.4 mmol/L — ABNORMAL HIGH (ref 3.5–5.1)
SODIUM: 135 mmol/L (ref 135–145)

## 2018-09-01 LAB — CBC WITH DIFFERENTIAL/PLATELET
Abs Immature Granulocytes: 0.06 10*3/uL (ref 0.00–0.07)
Basophils Absolute: 0 10*3/uL (ref 0.0–0.1)
Basophils Relative: 0 %
EOS ABS: 0.1 10*3/uL (ref 0.0–0.5)
EOS PCT: 1 %
HCT: 33.5 % — ABNORMAL LOW (ref 39.0–52.0)
Hemoglobin: 10.4 g/dL — ABNORMAL LOW (ref 13.0–17.0)
Immature Granulocytes: 1 %
LYMPHS PCT: 11 %
Lymphs Abs: 1.3 10*3/uL (ref 0.7–4.0)
MCH: 30 pg (ref 26.0–34.0)
MCHC: 31 g/dL (ref 30.0–36.0)
MCV: 96.5 fL (ref 80.0–100.0)
Monocytes Absolute: 1.1 10*3/uL — ABNORMAL HIGH (ref 0.1–1.0)
Monocytes Relative: 9 %
NRBC: 0 % (ref 0.0–0.2)
Neutro Abs: 9.2 10*3/uL — ABNORMAL HIGH (ref 1.7–7.7)
Neutrophils Relative %: 78 %
Platelets: 139 10*3/uL — ABNORMAL LOW (ref 150–400)
RBC: 3.47 MIL/uL — AB (ref 4.22–5.81)
RDW: 14.4 % (ref 11.5–15.5)
WBC: 11.8 10*3/uL — AB (ref 4.0–10.5)

## 2018-09-01 LAB — MAGNESIUM: MAGNESIUM: 2.5 mg/dL — AB (ref 1.7–2.4)

## 2018-09-01 LAB — CBC
HCT: 34.1 % — ABNORMAL LOW (ref 39.0–52.0)
Hemoglobin: 10.9 g/dL — ABNORMAL LOW (ref 13.0–17.0)
MCH: 30.8 pg (ref 26.0–34.0)
MCHC: 32 g/dL (ref 30.0–36.0)
MCV: 96.3 fL (ref 80.0–100.0)
Platelets: 141 10*3/uL — ABNORMAL LOW (ref 150–400)
RBC: 3.54 MIL/uL — ABNORMAL LOW (ref 4.22–5.81)
RDW: 14.6 % (ref 11.5–15.5)
WBC: 12.5 10*3/uL — AB (ref 4.0–10.5)
nRBC: 0 % (ref 0.0–0.2)

## 2018-09-01 LAB — INFLUENZA PANEL BY PCR (TYPE A & B)
Influenza A By PCR: NEGATIVE
Influenza B By PCR: NEGATIVE

## 2018-09-01 LAB — I-STAT TROPONIN, ED: Troponin i, poc: 0.65 ng/mL (ref 0.00–0.08)

## 2018-09-01 LAB — TSH: TSH: 1.174 u[IU]/mL (ref 0.350–4.500)

## 2018-09-01 LAB — BLOOD GAS, ARTERIAL
Acid-Base Excess: 4.3 mmol/L — ABNORMAL HIGH (ref 0.0–2.0)
Bicarbonate: 28.1 mmol/L — ABNORMAL HIGH (ref 20.0–28.0)
DRAWN BY: 25203
Delivery systems: POSITIVE
Expiratory PAP: 6
FIO2: 100
INSPIRATORY PAP: 14
O2 Saturation: 91.5 %
PO2 ART: 65.6 mmHg — AB (ref 83.0–108.0)
Patient temperature: 101
pCO2 arterial: 42.9 mmHg (ref 32.0–48.0)
pH, Arterial: 7.438 (ref 7.350–7.450)

## 2018-09-01 LAB — LACTIC ACID, PLASMA
Lactic Acid, Venous: 1.1 mmol/L (ref 0.5–1.9)
Lactic Acid, Venous: 1.5 mmol/L (ref 0.5–1.9)

## 2018-09-01 LAB — GLUCOSE, CAPILLARY
Glucose-Capillary: 169 mg/dL — ABNORMAL HIGH (ref 70–99)
Glucose-Capillary: 199 mg/dL — ABNORMAL HIGH (ref 70–99)

## 2018-09-01 LAB — BRAIN NATRIURETIC PEPTIDE: B Natriuretic Peptide: 4237.8 pg/mL — ABNORMAL HIGH (ref 0.0–100.0)

## 2018-09-01 MED ORDER — LIDOCAINE-PRILOCAINE 2.5-2.5 % EX CREA
1.0000 "application " | TOPICAL_CREAM | CUTANEOUS | Status: DC | PRN
  Filled 2018-09-01: qty 5

## 2018-09-01 MED ORDER — ASPIRIN EC 81 MG PO TBEC
81.0000 mg | DELAYED_RELEASE_TABLET | Freq: Every day | ORAL | Status: DC
  Administered 2018-09-03 – 2018-09-07 (×5): 81 mg via ORAL
  Filled 2018-09-01 (×5): qty 1

## 2018-09-01 MED ORDER — INSULIN ASPART 100 UNIT/ML ~~LOC~~ SOLN
0.0000 [IU] | Freq: Three times a day (TID) | SUBCUTANEOUS | Status: DC
  Administered 2018-09-02: 3 [IU] via SUBCUTANEOUS
  Administered 2018-09-02: 5 [IU] via SUBCUTANEOUS
  Administered 2018-09-03: 1 [IU] via SUBCUTANEOUS
  Administered 2018-09-03 – 2018-09-04 (×3): 3 [IU] via SUBCUTANEOUS
  Administered 2018-09-04 – 2018-09-05 (×2): 2 [IU] via SUBCUTANEOUS
  Administered 2018-09-05 (×2): 1 [IU] via SUBCUTANEOUS
  Administered 2018-09-06: 3 [IU] via SUBCUTANEOUS
  Administered 2018-09-06: 1 [IU] via SUBCUTANEOUS
  Administered 2018-09-07: 3 [IU] via SUBCUTANEOUS
  Administered 2018-09-07: 2 [IU] via SUBCUTANEOUS
  Administered 2018-09-07: 3 [IU] via SUBCUTANEOUS

## 2018-09-01 MED ORDER — AZITHROMYCIN 250 MG PO TABS
500.0000 mg | ORAL_TABLET | ORAL | Status: DC
  Administered 2018-09-01: 500 mg via ORAL
  Filled 2018-09-01: qty 2

## 2018-09-01 MED ORDER — SODIUM CHLORIDE 0.9 % IV SOLN
1.0000 g | INTRAVENOUS | Status: DC
  Administered 2018-09-01: 1 g via INTRAVENOUS
  Filled 2018-09-01: qty 10

## 2018-09-01 MED ORDER — SODIUM CHLORIDE 0.9% FLUSH
3.0000 mL | Freq: Two times a day (BID) | INTRAVENOUS | Status: DC
  Administered 2018-09-01 – 2018-09-07 (×6): 3 mL via INTRAVENOUS

## 2018-09-01 MED ORDER — SODIUM CHLORIDE 0.9 % IV SOLN: 100.0000 mL | INTRAVENOUS | Status: DC | PRN

## 2018-09-01 MED ORDER — AMLODIPINE BESYLATE 5 MG PO TABS
5.0000 mg | ORAL_TABLET | Freq: Every day | ORAL | Status: DC
  Administered 2018-09-03 – 2018-09-07 (×5): 5 mg via ORAL
  Filled 2018-09-01 (×5): qty 1

## 2018-09-01 MED ORDER — HEPARIN (PORCINE) 25000 UT/250ML-% IV SOLN
1650.0000 [IU]/h | INTRAVENOUS | Status: DC
  Administered 2018-09-01: 900 [IU]/h via INTRAVENOUS
  Administered 2018-09-02: 1100 [IU]/h via INTRAVENOUS
  Administered 2018-09-03: 1400 [IU]/h via INTRAVENOUS
  Administered 2018-09-04: 1650 [IU]/h via INTRAVENOUS
  Filled 2018-09-01 (×6): qty 250

## 2018-09-01 MED ORDER — VANCOMYCIN HCL IN DEXTROSE 1-5 GM/200ML-% IV SOLN
1000.0000 mg | Freq: Once | INTRAVENOUS | Status: AC
  Administered 2018-09-01: 1000 mg via INTRAVENOUS
  Filled 2018-09-01: qty 200

## 2018-09-01 MED ORDER — HEPARIN SODIUM (PORCINE) 1000 UNIT/ML DIALYSIS
1000.0000 [IU] | INTRAMUSCULAR | Status: DC | PRN
  Filled 2018-09-01: qty 1

## 2018-09-01 MED ORDER — VANCOMYCIN HCL IN DEXTROSE 750-5 MG/150ML-% IV SOLN: 750.0000 mg | INTRAVENOUS | Status: DC

## 2018-09-01 MED ORDER — DOXERCALCIFEROL 4 MCG/2ML IV SOLN
3.0000 ug | INTRAVENOUS | Status: DC
  Administered 2018-09-02 – 2018-09-03 (×3): 3 ug via INTRAVENOUS
  Filled 2018-09-01 (×3): qty 2

## 2018-09-01 MED ORDER — ALTEPLASE 2 MG IJ SOLR: 2.0000 mg | Freq: Once | INTRAMUSCULAR | Status: DC | PRN

## 2018-09-01 MED ORDER — METOPROLOL TARTRATE 12.5 MG HALF TABLET: 12.5000 mg | ORAL_TABLET | Freq: Two times a day (BID) | ORAL | Status: DC

## 2018-09-01 MED ORDER — SODIUM CHLORIDE 0.9 % IV SOLN: 250.0000 mL | INTRAVENOUS | Status: DC | PRN

## 2018-09-01 MED ORDER — SODIUM CHLORIDE 0.9 % IV SOLN
1.0000 g | INTRAVENOUS | Status: DC
  Filled 2018-09-01: qty 10

## 2018-09-01 MED ORDER — INSULIN ASPART 100 UNIT/ML IV SOLN
5.0000 [IU] | Freq: Once | INTRAVENOUS | Status: AC
  Administered 2018-09-01: 5 [IU] via INTRAVENOUS

## 2018-09-01 MED ORDER — SODIUM CHLORIDE 0.9 % IV SOLN: 2.0000 g | INTRAVENOUS | Status: DC

## 2018-09-01 MED ORDER — SODIUM CHLORIDE 0.9 % IV SOLN
2.0000 g | Freq: Once | INTRAVENOUS | Status: AC
  Administered 2018-09-01: 2 g via INTRAVENOUS
  Filled 2018-09-01: qty 2

## 2018-09-01 MED ORDER — AZITHROMYCIN 250 MG PO TABS: 500.0000 mg | ORAL_TABLET | ORAL | Status: DC

## 2018-09-01 MED ORDER — DEXTROSE 50 % IV SOLN
1.0000 | Freq: Once | INTRAVENOUS | Status: AC
  Administered 2018-09-01: 50 mL via INTRAVENOUS
  Filled 2018-09-01: qty 50

## 2018-09-01 MED ORDER — PENTAFLUOROPROP-TETRAFLUOROETH EX AERO: 1.0000 "application " | INHALATION_SPRAY | CUTANEOUS | Status: DC | PRN

## 2018-09-01 MED ORDER — ACETAMINOPHEN 325 MG PO TABS: 650.0000 mg | ORAL_TABLET | ORAL | Status: DC | PRN

## 2018-09-01 MED ORDER — ACETAMINOPHEN 500 MG PO TABS
1000.0000 mg | ORAL_TABLET | Freq: Four times a day (QID) | ORAL | Status: AC | PRN
  Administered 2018-09-01: 1000 mg via ORAL
  Filled 2018-09-01: qty 2

## 2018-09-01 MED ORDER — ROSUVASTATIN CALCIUM 5 MG PO TABS: 10.0000 mg | ORAL_TABLET | Freq: Every day | ORAL | Status: DC

## 2018-09-01 MED ORDER — IPRATROPIUM-ALBUTEROL 0.5-2.5 (3) MG/3ML IN SOLN
3.0000 mL | Freq: Once | RESPIRATORY_TRACT | Status: AC
  Administered 2018-09-01: 3 mL via RESPIRATORY_TRACT

## 2018-09-01 MED ORDER — HEPARIN SODIUM (PORCINE) 5000 UNIT/ML IJ SOLN: 5000.0000 [IU] | Freq: Three times a day (TID) | INTRAMUSCULAR | Status: DC

## 2018-09-01 MED ORDER — ACETAMINOPHEN 325 MG PO TABS
650.0000 mg | ORAL_TABLET | Freq: Once | ORAL | Status: AC
  Administered 2018-09-01: 650 mg via ORAL
  Filled 2018-09-01: qty 2

## 2018-09-01 MED ORDER — HEPARIN BOLUS VIA INFUSION
4000.0000 [IU] | Freq: Once | INTRAVENOUS | Status: AC
  Administered 2018-09-01: 4000 [IU] via INTRAVENOUS
  Filled 2018-09-01: qty 4000

## 2018-09-01 MED ORDER — LIDOCAINE HCL (PF) 1 % IJ SOLN: 5.0000 mL | INTRAMUSCULAR | Status: DC | PRN

## 2018-09-01 MED ORDER — RENA-VITE PO TABS
1.0000 | ORAL_TABLET | Freq: Every day | ORAL | Status: DC
  Administered 2018-09-01 – 2018-09-06 (×6): 1 via ORAL
  Filled 2018-09-01 (×6): qty 1

## 2018-09-01 MED ORDER — SUCROFERRIC OXYHYDROXIDE 500 MG PO CHEW
1000.0000 mg | CHEWABLE_TABLET | Freq: Three times a day (TID) | ORAL | Status: DC
  Administered 2018-09-02 – 2018-09-07 (×13): 1000 mg via ORAL
  Filled 2018-09-01 (×20): qty 2

## 2018-09-01 MED ORDER — GABAPENTIN 300 MG PO CAPS
300.0000 mg | ORAL_CAPSULE | Freq: Every day | ORAL | Status: DC
  Administered 2018-09-01 – 2018-09-06 (×6): 300 mg via ORAL
  Filled 2018-09-01 (×6): qty 1

## 2018-09-01 MED ORDER — SODIUM CHLORIDE 0.9 % IV SOLN
1.0000 g | Freq: Once | INTRAVENOUS | Status: AC
  Administered 2018-09-01: 1 g via INTRAVENOUS
  Filled 2018-09-01: qty 10

## 2018-09-01 MED ORDER — CHLORHEXIDINE GLUCONATE CLOTH 2 % EX PADS: 6.0000 | MEDICATED_PAD | Freq: Every day | CUTANEOUS | Status: DC

## 2018-09-01 MED ORDER — METOPROLOL TARTRATE 5 MG/5ML IV SOLN
5.0000 mg | Freq: Once | INTRAVENOUS | Status: AC
  Administered 2018-09-01: 5 mg via INTRAVENOUS
  Filled 2018-09-01: qty 5

## 2018-09-01 MED ORDER — ONDANSETRON HCL 4 MG/2ML IJ SOLN: 4.0000 mg | Freq: Four times a day (QID) | INTRAMUSCULAR | Status: DC | PRN

## 2018-09-01 MED ORDER — SODIUM CHLORIDE 0.9% FLUSH: 3.0000 mL | INTRAVENOUS | Status: DC | PRN

## 2018-09-01 MED ORDER — RENA-VITE PO TABS: 1.0000 | ORAL_TABLET | Freq: Every day | ORAL | Status: DC

## 2018-09-01 MED ORDER — NITROGLYCERIN 0.4 MG SL SUBL: 0.4000 mg | SUBLINGUAL_TABLET | SUBLINGUAL | Status: DC | PRN

## 2018-09-01 NOTE — Consult Note (Addendum)
I have seen and examined this patient and agree with the plan of care. Fell while getting dressed to come to dialysis this morning. He says his hands and feet were trembling and he felt as though there was pressure in his chest. His wife called 911 and brought him to the hospital. She says that he has not been well for over a month and has had some trembling and weakness. His K is 6.1  He has a mature AVF  L arm  And has BP 125/74 and blood sugar is not low. He had a slight low grade temperature and was slightly hypoxic  He was thought to have a slight infiltrate on his chest XRay   Sherril Croon 09/01/2018, 6:14 PM [facility] Renal Consultation Note    Indication for Consultation:  Management of ESRD/hemodialysis; anemia, hypertension/volume and secondary hyperparathyroidism PCP: Mack Hook, MD  HPI: Blake Santana is a 48 y.o. male with ESRD secondary to DM on HD for a year  at [facility].  He is generally compliant with his dialysis treatment with net UF of 2.5 - 3.5 L . He last attended 1/28.  Pre HD was 65.6 *EDW 64.5) and post wt was 68.3 - a net UF of 2.7 was recorded.  (it would appear either pre or post HD was in error).  Wt in the ED is 66.  BP were in their usual range 170/90 pre HD.  Post BP were a little higher than usual 140/90s. He was afebrile Tuesday He presented to the ED with SOB/severe CP and fever.  K was 6.1 WBC slightly elevated at 11.8 hgb lower than usual at 10.8  Initial trop is 0.65 up to 1.91. EKG showed some ST changes in lateral leads. Chest xray showed left > right basilar airspace disease worrisome for PNA and pul vascular congestion. He has had some dizziness over the past few days.  He does not make urine, has a chronic cough.  O2 sats were 80% when EMS arrived to his home prior to bring to ED.  Sats are good on supplemental O2 BP is in usual range.  He is due for dialysis today. He still has a little mid chest SS CP.  Wearing NRB Sats ok. No other  pain.  Past Medical History:  Diagnosis Date  . Anemia of chronic kidney failure 03/11/2017  . Chronic kidney disease 03/11/2017  . Diabetes mellitus without complication ([place]) 6384  . Edema 01/14/2017  . Headache   . Hypercholesterolemia   . Hypertension 03/11/2017  . Iron deficiency anemia 06/18/2016  . RSV (respiratory syncytial virus infection) 08/2017   Past Surgical History:  Procedure Laterality Date  . A/V FISTULAGRAM Left 01/19/2018   Procedure: A/V FISTULAGRAM - Left AV;  Surgeon: Waynetta Sandy, MD;  Location: [place] [facility];  Service: Cardiovascular;  Laterality: Left;  . AV FISTULA PLACEMENT Left 08/09/2017   Procedure: LEFT RADIOCEPHALIC ARTERIOVENOUS (AV) FISTULA CREATION;  Surgeon: [name], MD;  Location: [facility] OR;  Service: Vascular;  Laterality: Left;  . AV FISTULA PLACEMENT Left 02/02/2018   Procedure: ARTERIOVENOUS (AV) FISTULA CREATION LEFT UPPER EXTREMITY;  Surgeon: [name], MD;  Location: [place];  Service: Vascular;  Laterality: Left;  . INSERTION OF DIALYSIS CATHETER  08/09/2017  . INSERTION OF DIALYSIS CATHETER Right 08/09/2017   Procedure: INSERTION OF DIALYSIS CATHETER RIGHT INTERNAL JUGULAR;  Surgeon: [name], MD;  Location: [place];  Service: Vascular;  Laterality: Right;  .  IR REMOVAL TUN CV CATH W/O FL  07/01/2018   Family History  Problem Relation Age of Onset  . Diabetes Mother   . Kidney disease Mother        renal failure--diabetes, cause of death  . Hypertension Mother   . Diabetes Father   . Kidney disease Father        Cause of death:  kidney failure form diabetes  . Alcohol abuse Father   . Diabetes Sister    Social History:  reports that he has quit smoking. His smoking use included cigarettes. He has never used smokeless tobacco. He reports previous alcohol use. He reports that he does not use drugs. No Known Allergies Prior to Admission medications   Medication Sig Start Date End Date Taking? Authorizing Provider   amLODipine (NORVASC) 5 MG tablet 1 tab by mouth daily in the morning with breakfast Patient taking differently: Take 5 mg by mouth daily with breakfast.  10/22/17  Yes Mack Hook, MD  b complex-vitamin c-folic acid (NEPHRO-VITE) 0.8 MG TABS tablet Take 1 tablet by mouth daily.  08/11/18  Yes [provider]  gabapentin (NEURONTIN) 300 MG capsule 1 cap by mouth at bedtime. Patient taking differently: Take 300 mg by mouth at bedtime.  05/16/18  Yes Mack Hook, MD  glipiZIDE (GLUCOTROL) 5 MG tablet 1 tab by mouth twice daily with meals Patient taking differently: Take 5 mg by mouth 2 (two) times daily with a meal.  10/22/17  Yes Mack Hook, MD  metoprolol tartrate (LOPRESSOR) 25 MG tablet 1/2 tab by mouth twice daily with meals Patient taking differently: Take 12.5 mg by mouth 2 (two) times daily with a meal.  10/22/17  Yes Mack Hook, MD  rosuvastatin (CRESTOR) 10 MG tablet 1 tab by mouth daily with evening meal Patient taking differently: Take 10 mg by mouth daily with supper.  05/16/18  Yes Mack Hook, MD  sucroferric oxyhydroxide (VELPHORO) 500 MG chewable tablet Chew 500 mg by mouth 3 (three) times daily with meals.   Yes [provider]  Blood Glucose Monitoring Suppl (AGAMATRIX PRESTO) w/Device KIT Check sugars twice daily 06/04/16   Mack Hook, MD  calcium acetate (PHOSLO) 667 MG capsule 2 caps by mouth three times daily with meals Patient not taking: Reported on 09/01/2018 10/22/17   Mack Hook, MD  cetirizine (ZYRTEC) 10 MG tablet 1 tab by mouth daily as needed for allergies or cough Patient not taking: Reported on 09/01/2018 08/17/18   Mack Hook, MD  glucose blood (AGAMATRIX PRESTO TEST) test strip Check sugars twice daily 06/04/16   Mack Hook, MD  oseltamivir (TAMIFLU) 75 MG capsule 1 cap by mouth daily until everyone in household symptom free Patient not taking: Reported on 09/01/2018 07/13/18    Mack Hook, MD   Current Facility-Administered Medications  Medication Dose Route Frequency Provider Last Rate Last Dose  . [START ON 09/03/2018] ceFEPIme (MAXIPIME) 2 g in sodium chloride 0.9 % 100 mL IVPB  2 g Intravenous Q T,Th,Sat-1800 Rumbarger, Valeda Malm, RPH      . [START ON 09/02/2018] Chlorhexidine Gluconate Cloth 2 % PADS 6 each  6 each Topical Q0600 Edrick Oh, MD      . vancomycin (VANCOCIN) IVPB 1000 mg/200 mL premix  1,000 mg Intravenous Once Caccavale, Sophia, PA-C 200 mL/hr at 09/01/18 1511 1,000 mg at 09/01/18 1511  . [START ON 09/03/2018] vancomycin (VANCOCIN) IVPB 750 mg/150 ml premix  750 mg Intravenous Q T,Th,Sa-HD Rumbarger, Valeda Malm, RPH  Current Outpatient Medications  Medication Sig Dispense Refill  . amLODipine (NORVASC) 5 MG tablet 1 tab by mouth daily in the morning with breakfast (Patient taking differently: Take 5 mg by mouth daily with breakfast. ) 30 tablet 11  . b complex-vitamin c-folic acid (NEPHRO-VITE) 0.8 MG TABS tablet Take 1 tablet by mouth daily.     Marland Kitchen gabapentin (NEURONTIN) 300 MG capsule 1 cap by mouth at bedtime. (Patient taking differently: Take 300 mg by mouth at bedtime. ) 30 capsule 11  . glipiZIDE (GLUCOTROL) 5 MG tablet 1 tab by mouth twice daily with meals (Patient taking differently: Take 5 mg by mouth 2 (two) times daily with a meal. ) 60 tablet 11  . metoprolol tartrate (LOPRESSOR) 25 MG tablet 1/2 tab by mouth twice daily with meals (Patient taking differently: Take 12.5 mg by mouth 2 (two) times daily with a meal. ) 30 tablet 11  . rosuvastatin (CRESTOR) 10 MG tablet 1 tab by mouth daily with evening meal (Patient taking differently: Take 10 mg by mouth daily with supper. ) 90 tablet 11  . sucroferric oxyhydroxide (VELPHORO) 500 MG chewable tablet Chew 500 mg by mouth 3 (three) times daily with meals.    . Blood Glucose Monitoring Suppl (AGAMATRIX PRESTO) w/Device KIT Check sugars twice daily 1 kit 0  . calcium acetate (PHOSLO) 667  MG capsule 2 caps by mouth three times daily with meals (Patient not taking: Reported on 09/01/2018)    . cetirizine (ZYRTEC) 10 MG tablet 1 tab by mouth daily as needed for allergies or cough (Patient not taking: Reported on 09/01/2018) 30 tablet 11  . glucose blood (AGAMATRIX PRESTO TEST) test strip Check sugars twice daily 100 each 12  . oseltamivir (TAMIFLU) 75 MG capsule 1 cap by mouth daily until everyone in household symptom free (Patient not taking: Reported on 09/01/2018) 10 capsule 1   Labs: Basic Metabolic Panel: Recent Labs  Lab 08/26/18 0951 09/01/18 1216  NA 144 136  K 6.1* 6.1*  CL 104 99  CO2 26 26  GLUCOSE 131* 184*  BUN 28* 66*  CREATININE 7.03* 9.99*  CALCIUM 9.4 9.1   Liver Function Tests: Recent Labs  Lab 08/26/18 0951 09/01/18 1216  AST 10 16  ALT 12 14  ALKPHOS 83 63  BILITOT 0.3 0.7  PROT 7.4 6.8  ALBUMIN 4.1 3.3*   No results for input(s): LIPASE, AMYLASE in the last 168 hours. No results for input(s): AMMONIA in the last 168 hours. CBC: Recent Labs  Lab 09/01/18 1216  WBC 11.8*  NEUTROABS 9.2*  HGB 10.4*  HCT 33.5*  MCV 96.5  PLT 139*   Cardiac Enzymes: No results for input(s): CKTOTAL, CKMB, CKMBINDEX, TROPONINI in the last 168 hours. CBG: No results for input(s): GLUCAP in the last 168 hours. Iron Studies: No results for input(s): IRON, TIBC, TRANSFERRIN, FERRITIN in the last 72 hours. Studies/Results: Dg Chest Portable 1 View  Result Date: 09/01/2018 CLINICAL DATA:  Shortness of breath, chest pain and fever. EXAM: PORTABLE CHEST 1 VIEW COMPARISON:  PA and lateral chest 03/20/2018. Single-view of the chest 08/09/2017. FINDINGS: There is left worse than right basilar airspace disease. Cardiomegaly and vascular congestion noted. No pneumothorax or pleural effusion. No acute or focal bony abnormality. IMPRESSION: Left worse than right basilar airspace disease worrisome for pneumonia. Cardiomegaly and pulmonary vascular congestion.  Electronically Signed   By: Inge Rise M.D.   On: 09/01/2018 13:27    ROS: As per HPI otherwise negative.  Physical  Exam: Vitals:   09/01/18 1203 09/01/18 1410 09/01/18 1415 09/01/18 1515  BP:   (!) 150/76 (!) 156/75  Pulse:   84 81  Resp:   (!) 25 (!) 26  Temp:  99.4 F (37.4 C)    TempSrc:  Oral    SpO2:   100% 100%  Weight: 66 kg        General: WDWN NAD breathing easily with NRB sats ok No SOB with speaking Head: NCAT sclera not icteric MMM Neck: Supple.  Lungs: bilateral rales R> L Heart: RRR with S1 S2.  Abdomen: soft NT + BS Lower extremities:without edema or ischemic changes, no open wounds  Neuro: A & O  X 3. Moves all extremities spontaneously. No twitching noted at present at rest but seemed somewhat shakey when I asked him to sit up to listen to lungs Psych:  Responds to questions appropriately with a normal affect. Dialysis Access: left upper AVF b+ bruit  Dialysis Orders: TTS [facility] 3.75 hours EDW 64.5 2K 2 Ca heparin 2000 Hectorol 3 no ESA/Fe Recent labs: hgb 12.6 last Mircera 50 on 1/07 42% sat iPTH 279  Assessment/Plan: 1. Acute hypoxic respiratory failure with fever- PNA with some volume excess - antibiotics per primary - UF volume with HD-droplet precautions /influenza neg BC drawn 2. Chest pain with postive troponins - new ST changes - lateral leads - cards to see 3. ESRD -  Mild hyperkalemia - using 1 K bath for 1 hour then 2 K - was given Ca in ED 4. Hypertension/volume  - needs some volume off.  Measures from last outpatient EDW don't make sense - reweigh pre HD today and titrate to edw 5. Anemia  - off ESA since 1/7 - may need to resume - hgb down to 10.4 6. Metabolic bone disease -  Continue Hectorol 3 0 - on velphoro 7. Nutrition - changed to renal diet due to high K  heart healthy diet - add renavite 8. DM - per primary  Myriam Jacobson, PA-C [place] [phone] 09/01/2018, 3:50 PM

## 2018-09-01 NOTE — Progress Notes (Signed)
Pharmacy Antibiotic Note  Blake Santana is a 48 y.o. male admitted on 09/01/2018 with pneumonia.  Pharmacy has been consulted for vancomycin and cefepime dosing. Pt is febrile with Tmax 101.6 and WBC is elevated at 11.8. Pt with history of ESRD on HD.   Plan: Vancomycin 1gm IV x 1 then 750mg  post-HD Cefepime 2gm IV x 1 then 2gm QHD F/u renal plans, C&S, clinical status and pre-HD vanc level when needed  Weight: 145 lb 8.1 oz (66 kg)  Temp (24hrs), Avg:101.6 F (38.7 C), Min:101.6 F (38.7 C), Max:101.6 F (38.7 C)  Recent Labs  Lab 08/26/18 0951 09/01/18 1216 09/01/18 1221  WBC  --  11.8*  --   CREATININE 7.03* 9.99*  --   LATICACIDVEN  --   --  1.1    Estimated Creatinine Clearance: 7.2 mL/min (A) (by C-G formula based on SCr of 9.99 mg/dL (H)).    No Known Allergies  Antimicrobials this admission: Vanc 1/30>> Cefepime 1/30>>  Dose adjustments this admission: N/A  Microbiology results: Pending  Thank you for allowing pharmacy to be a part of this patient's care.  Maui Britten, Rande Lawman 09/01/2018 1:36 PM

## 2018-09-01 NOTE — Consult Note (Signed)
NAME:  Blake Santana, MRN:  [id number], DOB:  05-11-1971, LOS: 0 ADMISSION DATE:  09/01/2018, CONSULTATION DATE:  09/01/2018 REFERRING MD: Dr. Shawna Orleans, CHIEF COMPLAINT:  Respiratory distress  Brief History   48 yoM admitted with hypoxia, fever, and chest pain. Due for iHD today.  Cardiology seeing for NSTEMI.  CXR with pulmonary edema and possible left pneumonia.  Started on CAP coverage.  Progressive hypoxia and worsening pulmonary edema waiting on iHD, requiring BiPAP.   History of present illness   HPI obtained from chart review and limited amount from patient as he is on BiPAP and speaks little [place].   48 year old Hispanic male with hx of ESRD on TTS HD, DMT2, HTN, diastolic HF (EF 01/5169 EF 50-55%, G2DD), IDA and HLD who presented with several day history of fatigue and dizziness.  Developed severe chest pain and weakness this morning and became near syncopal.  He is compliant with HD, due today.  Patient reports his weight did not change after his last dialysis (Per Nephrology notes, weights were up, but a UF of 2.7 was recorded/ EDW 64.5.  Wt in ER 66kg.)  Denied recent fever, nausea/ vomiting, and had SOB with exertion or deep breath.  Has chronic unchanged cough.  He found by EMS hypoxic at 80% on room air.  Was treated with O2, nitro and ASA.  Workup in the ER, EKG noted for mild STD in lateral leads.  Noted to have positive troponins, 0.04 ->1.91 ->2.45.  He was placed on heparin gtt and Cardiology consulted.  Labs additionally noted for WBC 11.8, Hgb 10.4, glucose 188, BNP 4237, normal lactate, initial K 6.1, now 5.4 after treatment, Flu negative.  Chest xray showing left greater than right basilar airspace disease with cardiomegaly and vascular congestion.  He has been febrile, Tmax 103, and hypertensive.  Cultures sent and given one dose vancomycin and cefepime.  He was was admitted to FMTS.  Nephrology consulted for HD.  Later on in the evening, his hypoxia worsened requiring NRB and  then BIPAP.  Unfortunately, he will not be able to have iHD till 2 or 3 am.  PCCM consulted for worsening respiratory distress and possible transfer to ICU.   Past Medical History  Nonsmoker, ESRD, DMT2, HTN, HLD, diastolic HF, IDA  Significant Hospital Events   1/30 Admitted   Consults:  Nephrology Cardiology  Procedures:   Significant Diagnostic Tests:   Micro Data:  1/30 BCx 2 >> 1/30 sputum cx  >>  Antimicrobials:  1/30 vanc/ cefepime x 1 1/30 ceftriaxone >> 1/30 azithromax >>  Interim history/subjective:   Objective   Blood pressure (!) 190/89, pulse (!) 101, temperature 100.3 F (37.9 C), temperature source Oral, resp. rate (!) 34, height 5' 2" (1.575 m), weight 71.9 kg, SpO2 93 %.    FiO2 (%):  [100 %] 100 %   Intake/Output Summary (Last 24 hours) at 09/01/2018 2316 Last data filed at 09/01/2018 1732 Gross per 24 hour  Intake 400 ml  Output -  Net 400 ml   Filed Weights   09/01/18 1203 09/01/18 1732  Weight: 66 kg 71.9 kg    Examination: General:  Adult Hispanic male sitting upright in bed in NAD on BiPAP HEENT: pupils 3/reactive, full face bipap mask- minor leak with facial hair, no JVD Neuro: Awake, oriented, non focal, MAE CV: SR 90's rrr, no mumur, +2 pulses, LUE AVF PULM: even/non-labored on BiPAP 16/6, rales left lung, rales in right posterior base only, no wheeze  GI:soft, non-tender, bs active  Extremities: Hot/moist, no edema  Skin: no rashes   Resolved Hospital Problem list    Assessment & Plan:  Acute hypoxic respiratory failure in the setting of pulmonary edema +/- CAP - flu neg - CXR with L>R airspace disease, pulmonary edema, cardiomegaly P:  Patient is stable on BiPAP, does not need transfer to ICU at this time Needs HD, earliest time available is ~2-3 am Continue BiPAP until HD is complete and then trial off Wean FiO2 for sats > 94%  Probable Left Infiltrate  P:  Continue ceftriaxone/ azithro Consider repeat CXR after  iHD Follow culture data Trend WBC/ fever curve Tylenol PRN Patient is anuric - unable to send urine strep or legionella  ESRD Hyperkalemia - EDW 64.5 kg, current wt 66 P:  Per Nephrology Pending iHD tonight Trend BMP  NSTEMI P:  Per Cards On heparin gtt Daily ASA Trending EKG and troponin Pending TTE to assess EF and new wall motion abnormality Will likely need further ischemic workup once afebrile x 48hrs  HTN/ diastolic HF P:  Lopressor and norvasc daily  Pending TTE  DMT2 - HgbA1c 7.4 P:  SSI/ CBG  HLD P:  Daily Crestor   IDA P:  Trend CBC   Nothing further to add.  PCCM will sign off.  Please do not hesitate to call us back if we can be of any further assistance.  Best practice:  Diet: per primary  Pain/Anxiety/Delirium protocol (if indicated): n/a VAP protocol (if indicated): n/a DVT prophylaxis: heparin gtt GI prophylaxis: n/a Glucose control: SSI/ CBG Mobility: as tolerated Code Status: full  Family Communication: patient updated on plan of care, patient's brother at bedside Disposition: progressive  Labs   CBC: Recent Labs  Lab 09/01/18 1216 09/01/18 1856  WBC 11.8* 12.5*  NEUTROABS 9.2*  --   HGB 10.4* 10.9*  HCT 33.5* 34.1*  MCV 96.5 96.3  PLT 139* 141*    Basic Metabolic Panel: Recent Labs  Lab 08/26/18 0951 09/01/18 1216 09/01/18 1904  NA 144 136 135  K 6.1* 6.1* 5.4*  CL 104 99 94*  CO2 26 26 26  GLUCOSE 131* 184* 188*  BUN 28* 66* 70*  CREATININE 7.03* 9.99* 10.48*  CALCIUM 9.4 9.1 9.8  MG  --  2.5*  --   PHOS  --   --  3.5   GFR: Estimated Creatinine Clearance: 7.4 mL/min (A) (by C-G formula based on SCr of 10.48 mg/dL (H)). Recent Labs  Lab 09/01/18 1216 09/01/18 1221 09/01/18 1348 09/01/18 1856  WBC 11.8*  --   --  12.5*  LATICACIDVEN  --  1.1 1.5  --     Liver Function Tests: Recent Labs  Lab 08/26/18 0951 09/01/18 1216 09/01/18 1904  AST 10 16  --   ALT 12 14  --   ALKPHOS 83 63  --    BILITOT 0.3 0.7  --   PROT 7.4 6.8  --   ALBUMIN 4.1 3.3* 3.2*   No results for input(s): LIPASE, AMYLASE in the last 168 hours. No results for input(s): AMMONIA in the last 168 hours.  ABG    Component Value Date/Time   PHART 7.438 09/01/2018 2245   PCO2ART 42.9 09/01/2018 2245   PO2ART 65.6 (L) 09/01/2018 2245   HCO3 28.1 (H) 09/01/2018 2245   TCO2 29 01/19/2018 0908   O2SAT 91.5 09/01/2018 2245     Coagulation Profile: No results for input(s): INR, PROTIME in the last 168 hours.    Cardiac Enzymes: Recent Labs  Lab 09/01/18 1516 09/01/18 1856  TROPONINI 1.91* 2.45*    HbA1C: Hgb A1c MFr Bld  Date/Time Value Ref Range Status  08/26/2018 09:51 AM 7.4 (H) 4.8 - 5.6 % Final    Comment:             Prediabetes: 5.7 - 6.4          Diabetes: >6.4          Glycemic control for adults with diabetes: <7.0   05/16/2018 11:04 AM 8.0 (H) 4.8 - 5.6 % Final    Comment:             Prediabetes: 5.7 - 6.4          Diabetes: >6.4          Glycemic control for adults with diabetes: <7.0     CBG: Recent Labs  Lab 09/01/18 1740 09/01/18 2059  GLUCAP 169* 199*    Review of Systems:   POSITIVES IN BOLD Gen: Denies fever, chills, weight change, fatigue PULM: Denies shortness of breath, cough, sputum production, hemoptysis, wheezing CV: Denies chest pain, edema, orthopnea, paroxysmal nocturnal dyspnea, palpitations GI: Denies abdominal pain, nausea, vomiting, diarrhea, hematochezia, melena, change in bowel habits Endocrine: Denies hot or cold intolerance, polyuria, polyphagia or appetite change Heme: Denies easy bruising, bleeding Neuro: Denies dizziness, headache, slurred speech, loss of memory or consciousness  Past Medical History  He,  has a past medical history of Anemia of chronic kidney failure (03/11/2017), Chronic kidney disease (03/11/2017), Diabetes mellitus without complication ([place]) (6948), Edema (01/14/2017), Headache, Hypercholesterolemia, Hypertension (03/11/2017),  Iron deficiency anemia (06/18/2016), and RSV (respiratory syncytial virus infection) (08/2017).   Surgical History    Past Surgical History:  Procedure Laterality Date  . A/V FISTULAGRAM Left 01/19/2018   Procedure: A/V FISTULAGRAM - Left AV;  Surgeon: Waynetta Sandy, MD;  Location: [place] [facility];  Service: Cardiovascular;  Laterality: Left;  . AV FISTULA PLACEMENT Left 08/09/2017   Procedure: LEFT RADIOCEPHALIC ARTERIOVENOUS (AV) FISTULA CREATION;  Surgeon: [name], MD;  [facility];  Service: Vascular;  Laterality: Left;  . AV FISTULA PLACEMENT Left 02/02/2018   Procedure: ARTERIOVENOUS (AV) FISTULA CREATION LEFT UPPER EXTREMITY;  Surgeon: [name], MD;  Location: [place];  Service: Vascular;  Laterality: Left;  . INSERTION OF DIALYSIS CATHETER  08/09/2017  . INSERTION OF DIALYSIS CATHETER Right 08/09/2017   Procedure: INSERTION OF DIALYSIS CATHETER RIGHT INTERNAL JUGULAR;  Surgeon: [name], MD;  Location: [facility];  Service: Vascular;  Laterality: Right;  . IR REMOVAL TUN CV CATH W/O FL  07/01/2018     Social History   reports that he has quit smoking. His smoking use included cigarettes. He has never used smokeless tobacco. He reports previous alcohol use. He reports that he does not use drugs.   Family History   His family history includes Alcohol abuse in his father; Diabetes in his father, mother, and sister; Hypertension in his mother; Kidney disease in his father and mother.   Allergies No Known Allergies   Home Medications  Prior to Admission medications   Medication Sig Start Date End Date Taking? Authorizing Provider  amLODipine (NORVASC) 5 MG tablet 1 tab by mouth daily in the morning with breakfast Patient taking differently: Take 5 mg by mouth daily with breakfast.  10/22/17  Yes Mack Hook, MD  b complex-vitamin c-folic acid (NEPHRO-VITE) 0.8 MG TABS tablet Take 1 tablet by mouth daily.  08/11/18  Yes [provider]  gabapentin  (NEURONTIN) 300 MG capsule 1 cap by mouth at bedtime. Patient taking differently: Take 300 mg by mouth at bedtime.  05/16/18  Yes Mulberry, Elizabeth, MD  glipiZIDE (GLUCOTROL) 5 MG tablet 1 tab by mouth twice daily with meals Patient taking differently: Take 5 mg by mouth 2 (two) times daily with a meal.  10/22/17  Yes Mulberry, Elizabeth, MD  metoprolol tartrate (LOPRESSOR) 25 MG tablet 1/2 tab by mouth twice daily with meals Patient taking differently: Take 12.5 mg by mouth 2 (two) times daily with a meal.  10/22/17  Yes Mulberry, Elizabeth, MD  rosuvastatin (CRESTOR) 10 MG tablet 1 tab by mouth daily with evening meal Patient taking differently: Take 10 mg by mouth daily with supper.  05/16/18  Yes Mulberry, Elizabeth, MD  sucroferric oxyhydroxide (VELPHORO) 500 MG chewable tablet Chew 500 mg by mouth 3 (three) times daily with meals.   Yes [provider]  Blood Glucose Monitoring Suppl (AGAMATRIX PRESTO) w/Device KIT Check sugars twice daily 06/04/16   Mulberry, Elizabeth, MD  calcium acetate (PHOSLO) 667 MG capsule 2 caps by mouth three times daily with meals Patient not taking: Reported on 09/01/2018 10/22/17   Mulberry, Elizabeth, MD  cetirizine (ZYRTEC) 10 MG tablet 1 tab by mouth daily as needed for allergies or cough Patient not taking: Reported on 09/01/2018 08/17/18   Mulberry, Elizabeth, MD  glucose blood (AGAMATRIX PRESTO TEST) test strip Check sugars twice daily 06/04/16   Mulberry, Elizabeth, MD        Brooke Simpson, MSN, AGACNP-BC [facility] Pgr: [phone] or if no answer [phone] 09/01/2018, 11:16 PM     

## 2018-09-01 NOTE — Progress Notes (Signed)
Placed patient on bipap due to decreased in Sp02 levels. Patient was wearing 100% NRB with Sp02=79-83%. After bipap was placed patients Sp02 increased to 93-95%.

## 2018-09-01 NOTE — ED Triage Notes (Signed)
Pt had sudden onset of CP today followed by syncopal episode. Pt sats 80% when fire arrived, and pts coloring was gray. Pt alert and oriented when EMS arrived, sats improved to 88% on [place]. Pt was transitioned to nonrebreather- sats 100%. EMS gave 324mg  ASA and 1 nitro. Pt is spanish speaking. BP 168/92

## 2018-09-01 NOTE — Consult Note (Signed)
Cardiology Consultation:   Patient ID: Blake Santana MRN: [id number]; DOB: February 03, 1971  Admit date: 09/01/2018 Date of Consult: 09/01/2018  Primary Care Provider: Mack Hook, MD Primary Cardiologist: [name], MD NEW Primary Electrophysiologist:  None    Patient Profile:   Blake Santana is a 48 y.o. male with a hx of ESRD with AV graft, DM, HLD, HTN- not always controlled though at dialysis it runs low, and iron def. anemia who is being seen today for the evaluation of chest pain and syncope at the request of Sr. Schlossman.  History of Present Illness:   [name] with above hx and no know CAD, Echo in 2018 with normal EF 50-55% G2 DD, mild MR, LA was moderately dilated. PA pk pressure 35 mmHg, a small pericardial effusion was seen.  He has ESRD and HD on TTS and has not had today.  Prior to dialysis he did have anasarca and CHF.  Today pt was in his usual health and when he got up this AM he had chest tightness and when putting on his pants he fell on floor.  He was weak.  He denies passing out and I verified with an interpreter twice.   EMS was called and he was given SL NTG which relieved the chest tightness. Also given and ASA.  Currently without any pain.    EKG:  The EKG was personally reviewed and demonstrates:  SR with lateral T wave depression.  More pronounced than last year.  Telemetry:  Telemetry was personally reviewed and demonstrates:  SR  Troponin 0.65 Na 136, K+ 6.1, BUN 66, Cr 9.99, Mag 2.5,  WBC 11.8, Hgb 10.4, plts 139 Recent hgb A1c of 7.4  Lipids in office T chol 131, Hdl 43, LDL 65  CXR Left worse than right basilar airspace disease worrisome for Pneumonia.  Cardiomegaly and pulmonary vascular congestion  BP 175/85  Temp of 101.6   He is receiving IV ABX.  Also receiving D10, Ca+ and insulin for the hyperkalemia and renal is aware of admit with orders for HD.    He has no family hx of CAD.     Past Medical History:    Diagnosis Date  . Anemia of chronic kidney failure 03/11/2017  . Chronic kidney disease 03/11/2017  . Diabetes mellitus without complication ([place]) 7867  . Edema 01/14/2017  . Headache   . Hypercholesterolemia   . Hypertension 03/11/2017  . Iron deficiency anemia 06/18/2016  . RSV (respiratory syncytial virus infection) 08/2017    Past Surgical History:  Procedure Laterality Date  . A/V FISTULAGRAM Left 01/19/2018   Procedure: A/V FISTULAGRAM - Left AV;  Surgeon: Waynetta Sandy, MD;  Location: [facility];  Service: Cardiovascular;  Laterality: Left;  . AV FISTULA PLACEMENT Left 08/09/2017   Procedure: LEFT RADIOCEPHALIC ARTERIOVENOUS (AV) FISTULA CREATION;  Surgeon: [name], MD;  Location: [facility];  Service: Vascular;  Laterality: Left;  . AV FISTULA PLACEMENT Left 02/02/2018   Procedure: ARTERIOVENOUS (AV) FISTULA CREATION LEFT UPPER EXTREMITY;  Surgeon: [name], MD;  Location: [facility];  Service: Vascular;  Laterality: Left;  . INSERTION OF DIALYSIS CATHETER  08/09/2017  . INSERTION OF DIALYSIS CATHETER Right 08/09/2017   Procedure: INSERTION OF DIALYSIS CATHETER RIGHT INTERNAL JUGULAR;  Surgeon: [name], MD;  Location: [facility];  Service: Vascular;  Laterality: Right;  . IR REMOVAL TUN CV CATH W/O FL  07/01/2018     Home Medications:  Prior to Admission  medications   Medication Sig Start Date End Date Taking? Authorizing Provider  amLODipine (NORVASC) 5 MG tablet 1 tab by mouth daily in the morning with breakfast 10/22/17   Mack Hook, MD  b complex-vitamin c-folic acid (NEPHRO-VITE) 0.8 MG TABS tablet TAKE 1 TABLET BY MOUTH EVERY DAY (ON DIALYSIS DAYS, TAKE AFTER DIALYSIS TREATMENT) 08/11/18   [provider]  Blood Glucose Monitoring Suppl (AGAMATRIX PRESTO) w/Device KIT Check sugars twice daily Patient not taking: Reported on 08/17/2018 06/04/16   Mack Hook, MD  calcium acetate (PHOSLO) 667 MG capsule 2 caps by mouth three times daily with  meals Patient not taking: Reported on 08/17/2018 10/22/17   Mack Hook, MD  cetirizine (ZYRTEC) 10 MG tablet 1 tab by mouth daily as needed for allergies or cough 08/17/18   Mack Hook, MD  gabapentin (NEURONTIN) 300 MG capsule 1 cap by mouth at bedtime. 05/16/18   Mack Hook, MD  glipiZIDE (GLUCOTROL) 5 MG tablet 1 tab by mouth twice daily with meals 10/22/17   Mack Hook, MD  glucose blood (AGAMATRIX PRESTO TEST) test strip Check sugars twice daily 06/04/16   Mack Hook, MD  metoprolol tartrate (LOPRESSOR) 25 MG tablet 1/2 tab by mouth twice daily with meals 10/22/17   Mack Hook, MD  oseltamivir (TAMIFLU) 75 MG capsule 1 cap by mouth daily until everyone in household symptom free Patient not taking: Reported on 08/17/2018 07/13/18   Mack Hook, MD  rosuvastatin (CRESTOR) 10 MG tablet 1 tab by mouth daily with evening meal 05/16/18   Mack Hook, MD  sucroferric oxyhydroxide (VELPHORO) 500 MG chewable tablet Chew 500 mg by mouth 3 (three) times daily with meals.    [provider]    Inpatient Medications: Scheduled Meds:  Continuous Infusions:  PRN Meds:   Allergies:   No Known Allergies  Social History:   Social History   Socioeconomic History  . Marital status: Married    Spouse name: [name]  . Number of children: 1  . Years of education: 49  . Highest education level: Not on file  Occupational History  . Occupation: [employer]  . Financial resource strain: Not on file  . Food insecurity:    Worry: Not on file    Inability: Not on file  . Transportation needs:    Medical: Not on file    Non-medical: Not on file  Tobacco Use  . Smoking status: Former Smoker    Types: Cigarettes  . Smokeless tobacco: Never Used  Substance and Sexual Activity  . Alcohol use: Not Currently  . Drug use: No  . Sexual activity: Not on file  Lifestyle  . Physical activity:    Days per week: Not  on file    Minutes per session: Not on file  . Stress: Not on file  Relationships  . Social connections:    Talks on phone: Not on file    Gets together: Not on file    Attends religious service: Not on file    Active member of club or organization: Not on file    Attends meetings of clubs or organizations: Not on file    Relationship status: Not on file  . Intimate partner violence:    Fear of current or ex partner: Not on file    Emotionally abused: Not on file    Physically abused: Not on file    Forced sexual activity: Not on file  Other Topics Concern  . Not on file  Social History Narrative   Originally from [place]   Came to [place] In [place] with his girlfriend and their son.    Family History:    Family History  Problem Relation Age of Onset  . Diabetes Mother   . Kidney disease Mother        renal failure--diabetes, cause of death  . Hypertension Mother   . Diabetes Father   . Kidney disease Father        Cause of death:  kidney failure form diabetes  . Alcohol abuse Father   . Diabetes Sister      ROS:  Please see the history of present illness.  General:no colds or fevers, no weight changes Skin:no rashes or ulcers HEENT:no blurred vision, no congestion CV:see HPI PUL:see HPI GI:no diarrhea constipation or melena, no indigestion GU:no hematuria, no dysuria MS:no joint pain, no claudication Neuro:no syncope, no lightheadedness Endo:+ diabetes, no thyroid disease  All other ROS reviewed and negative.     Physical Exam/Data:   Vitals:   09/01/18 1200 09/01/18 1203  BP: (!) 175/85   Pulse: 99   Resp: 14   Temp: (!) 101.6 F (38.7 C)   TempSrc: Oral   SpO2: 100%   Weight:  66 kg   No intake or output data in the 24 hours ending 09/01/18 1328 Last 3 Weights 09/01/2018 08/17/2018 05/16/2018  Weight (lbs) 145 lb 8.1 oz 144 lb 140 lb  Weight (kg) 66 kg 65.318 kg 63.504 kg     Body mass index is 25.77 kg/m.  General:  Well nourished, well  developed, in no acute distress with oxygen by mask HEENT: normal Lymph: no adenopathy Neck: no JVD Endocrine:  No thryomegaly Vascular: No carotid bruits; post tib. pulses 2+ bilaterally Cardiac:  normal S1, S2; RRR; no murmur gallup rub or clck Lungs:  Clear to harsh breath sounds to auscultation bilaterally, no wheezing, rhonchi or rales  Abd: soft, nontender, no hepatomegaly  Ext: no edema Musculoskeletal:  No deformities, BUE and BLE strength normal and equal Skin: warm and dry  Neuro:  Alert and oriented X 3 MAE follows commands    Relevant CV Studies: Echo 04/03/17  Study Conclusions  - Left ventricle: The cavity size was normal. Wall thickness was   normal. Systolic function was normal. The estimated ejection   fraction was in the range of 50% to 55%. Wall motion was normal;   there were no regional wall motion abnormalities. Features are   consistent with a pseudonormal left ventricular filling pattern,   with concomitant abnormal relaxation and increased filling   pressure (grade 2 diastolic dysfunction). Doppler parameters are   consistent with high ventricular filling pressure. - Mitral valve: There was mild regurgitation. - Left atrium: The atrium was moderately dilated. - Pulmonary arteries: Systolic pressure was mildly increased. PA   peak pressure: 35 mm Hg (S). - Pericardium, extracardiac: A small pericardial effusion was   identified.  Impressions:  - Normal LV systolic function; moderate diastolic dysfunction;   elevated LV filling pressure; mild MR; moderate LAE; mild TR with   mildly elevated pulmonary pressure; small pericardial effusion.  Laboratory Data:  Chemistry Recent Labs  Lab 08/26/18 0951 09/01/18 1216  NA 144 136  K 6.1* 6.1*  CL 104 99  CO2 26 26  GLUCOSE 131* 184*  BUN 28* 66*  CREATININE 7.03* 9.99*  CALCIUM 9.4 9.1  GFRNONAA 8* 5*  GFRAA 10* 6*  ANIONGAP  --  11  Recent Labs  Lab 08/26/18 0951 09/01/18 1216  PROT  7.4 6.8  ALBUMIN 4.1 3.3*  AST 10 16  ALT 12 14  ALKPHOS 83 63  BILITOT 0.3 0.7   Hematology Recent Labs  Lab 09/01/18 1216  WBC 11.8*  RBC 3.47*  HGB 10.4*  HCT 33.5*  MCV 96.5  MCH 30.0  MCHC 31.0  RDW 14.4  PLT 139*   Cardiac EnzymesNo results for input(s): TROPONINI in the last 168 hours.  Recent Labs  Lab 09/01/18 1226  TROPIPOC 0.65*    BNPNo results for input(s): BNP, PROBNP in the last 168 hours.  DDimer No results for input(s): DDIMER in the last 168 hours.  Radiology/Studies:  No results found.  Assessment and Plan:   1. Chest pain and pt denies syncope, he lost balance and fell putting on his pants, troponin is elevated but with chronic renal failure may be elevated along with hypoxia.Marland Kitchen  Also combination of possible PNA with fever.  Would do serial troponins, to see trend.  Dr. Radford Pax to see, repeat echo. Was given 324 of ASA and 1 NTG by EMS.  Will discuss with Dr. Radford Pax on IV heparin.   2. PNA on CXR and fever now on ABX.  Per primary team 3. Hypoxia with sats at 88%, on non rebreather per primary team 4. Hyperkalemia per primary team   5. ESRD on HD TTS 6. HTN elevated but does run low with dialysis  7. HLD on crestor 10 and controlled. With LDL at 65.       For questions or updates, please contact [place] Please consult [email or web address] for contact info under     Signed, Cecilie Kicks, NP  09/01/2018 1:28 PM

## 2018-09-01 NOTE — Progress Notes (Signed)
Talked with cardiology attending, Dr. Radford Pax regarding elevated troponin at 1.91.  Her note is pending at this time.  She would like to hold on treating for NSTEMI with heparin drip.  Will likely need cath.  Anticipate HD later this evening.  We will continue trending troponins and monitor closely.  Harriet Butte, [place], PGY-3

## 2018-09-01 NOTE — ED Provider Notes (Signed)
[facility] DEPART[facility] Provider Note   CSN: [id number] Arrival date & time: 09/01/18  1156     History   Chief Complaint Chief Complaint  Patient presents with  . Chest Pain    HPI Blake Santana is a 48 y.o. male presenting for evaluation of chest pain.  Patient states that he started having intermittent chest pain last night.  Today, chest pain became severe, and he had a syncopal event per EMS, although patient states he remembers what happened.  Patient reports since this morning, his chest pain is constant.  It is located under his sternum, described as a pressure.  He states he feels short of breath because he cannot take a deep breath in due to the pain.  He denies nausea or vomiting. States the past few days he has felt dizzy/off.  He denies history of heart problems, though does have type 2 diabetes, hypertension, ESRD on dialysis (x1 yr), and CHF.  Patient goes to dialysis Tuesday, Thursday, Saturday.  Last went on Tuesday, states they did not pull off a lot of fluid and he feels like he has put on weight/fluid over the past several days.  Patient states he had a similar episode many years ago, was told that he had fluid on his lungs.  He does not produce urine anymore.  He denies known fever.  He has a chronic cough, unchanged recently.  Denies abdominal pain or abnormal bowel movements.  Patient states his hands and feet have been twitching/spasming over the past day.  He denies tobacco, alcohol, or drug use.  Per EMS, upon arrival, patient was gray, hypoxic at 80% on room air.    HPI  Past Medical History:  Diagnosis Date  . Anemia of chronic kidney failure 03/11/2017  . Chronic kidney disease 03/11/2017  . Diabetes mellitus without complication ([place]) 1540  . Edema 01/14/2017  . Headache   . Hypercholesterolemia   . Hypertension 03/11/2017  . Iron deficiency anemia 06/18/2016  . RSV (respiratory syncytial virus infection) 08/2017     Patient Active Problem List   Diagnosis Date Noted  . Malnutrition of moderate degree 08/11/2017  . RSV (respiratory syncytial virus infection) 08/09/2017  . ESRD (end stage renal disease) ([place]) 08/08/2017  . Acute CHF (congestive heart failure) ([place]) 04/03/2017  . Anemia of chronic kidney failure 03/11/2017  . Hypertension 03/11/2017  . Chronic kidney disease 03/11/2017  . Edema 01/14/2017  . Dyslipidemia 10/15/2016  . Diabetic retinopathy ([place]) 10/15/2016  . Microalbuminuria 06/18/2016  . Iron deficiency anemia 06/18/2016  . Uncontrolled type 2 diabetes mellitus with complication, without long-term current use of insulin ([place]) 10/20/2001    Past Surgical History:  Procedure Laterality Date  . A/V FISTULAGRAM Left 01/19/2018   Procedure: A/V FISTULAGRAM - Left AV;  Surgeon: Waynetta Sandy, MD;  Location: [facility];  Service: Cardiovascular;  Laterality: Left;  . AV FISTULA PLACEMENT Left 08/09/2017   Procedure: LEFT RADIOCEPHALIC ARTERIOVENOUS (AV) FISTULA CREATION;  Surgeon: [name], MD;  Location: [facility];  Service: Vascular;  Laterality: Left;  . AV FISTULA PLACEMENT Left 02/02/2018   Procedure: ARTERIOVENOUS (AV) FISTULA CREATION LEFT UPPER EXTREMITY;  Surgeon: [name], MD;  Location: [facility];  Service: Vascular;  Laterality: Left;  . INSERTION OF DIALYSIS CATHETER  08/09/2017  . INSERTION OF DIALYSIS CATHETER Right 08/09/2017   Procedure: INSERTION OF DIALYSIS CATHETER RIGHT INTERNAL JUGULAR;  Surgeon: [name], MD;  Location: [facility];  Service: Vascular;  Laterality: Right;  . IR REMOVAL TUN CV CATH W/O FL  07/01/2018        Home Medications    Prior to Admission medications   Medication Sig Start Date End Date Taking? Authorizing Provider  amLODipine (NORVASC) 5 MG tablet 1 tab by mouth daily in the morning with breakfast 10/22/17   Mack Hook, MD  b complex-vitamin c-folic acid (NEPHRO-VITE) 0.8 MG TABS tablet TAKE 1 TABLET BY  MOUTH EVERY DAY (ON DIALYSIS DAYS, TAKE AFTER DIALYSIS TREATMENT) 08/11/18   [provider]  Blood Glucose Monitoring Suppl (AGAMATRIX PRESTO) w/Device KIT Check sugars twice daily Patient not taking: Reported on 08/17/2018 06/04/16   Mack Hook, MD  calcium acetate (PHOSLO) 667 MG capsule 2 caps by mouth three times daily with meals Patient not taking: Reported on 08/17/2018 10/22/17   Mack Hook, MD  cetirizine (ZYRTEC) 10 MG tablet 1 tab by mouth daily as needed for allergies or cough 08/17/18   Mack Hook, MD  gabapentin (NEURONTIN) 300 MG capsule 1 cap by mouth at bedtime. 05/16/18   Mack Hook, MD  glipiZIDE (GLUCOTROL) 5 MG tablet 1 tab by mouth twice daily with meals 10/22/17   Mack Hook, MD  glucose blood (AGAMATRIX PRESTO TEST) test strip Check sugars twice daily 06/04/16   Mack Hook, MD  metoprolol tartrate (LOPRESSOR) 25 MG tablet 1/2 tab by mouth twice daily with meals 10/22/17   Mack Hook, MD  oseltamivir (TAMIFLU) 75 MG capsule 1 cap by mouth daily until everyone in household symptom free Patient not taking: Reported on 08/17/2018 07/13/18   Mack Hook, MD  rosuvastatin (CRESTOR) 10 MG tablet 1 tab by mouth daily with evening meal 05/16/18   Mack Hook, MD  sucroferric oxyhydroxide (VELPHORO) 500 MG chewable tablet Chew 500 mg by mouth 3 (three) times daily with meals.    [provider]    Family History Family History  Problem Relation Age of Onset  . Diabetes Mother   . Kidney disease Mother        renal failure--diabetes, cause of death  . Hypertension Mother   . Diabetes Father   . Kidney disease Father        Cause of death:  kidney failure form diabetes  . Alcohol abuse Father   . Diabetes Sister     Social History Social History   Tobacco Use  . Smoking status: Former Smoker    Types: Cigarettes  . Smokeless tobacco: Never Used  Substance Use Topics  . Alcohol use:  Not Currently  . Drug use: No     Allergies   Patient has no known allergies.   Review of Systems Review of Systems  Constitutional: Positive for fever (pt denies).  Respiratory: Positive for cough (chronic).   Cardiovascular: Positive for chest pain.  Neurological: Positive for dizziness.  All other systems reviewed and are negative.    Physical Exam Updated Vital Signs BP (!) 175/85 (BP Location: Right Arm)   Pulse 99   Temp (!) 101.6 F (38.7 C) (Oral)   Resp 14   Wt 66 kg   SpO2 100%   BMI 25.77 kg/m   Physical Exam Vitals signs and nursing note reviewed.  Constitutional:      General: He is not in acute distress.    Appearance: He is well-developed.     Comments: Appears ill. Feels warm to the touch  HENT:     Head: Normocephalic and atraumatic.     Mouth/Throat:  Mouth: Mucous membranes are dry.  Eyes:     Conjunctiva/sclera: Conjunctivae normal.     Pupils: Pupils are equal, round, and reactive to light.  Neck:     Musculoskeletal: Normal range of motion and neck supple.  Cardiovascular:     Rate and Rhythm: Normal rate and regular rhythm.     Pulses: Normal pulses.  Pulmonary:     Effort: Pulmonary effort is normal. No respiratory distress.     Breath sounds: Normal breath sounds. No wheezing.     Comments: Speaking in full sentences, on NRB. No respiratory distress Abdominal:     General: There is no distension.     Palpations: Abdomen is soft. There is no mass.     Tenderness: There is no abdominal tenderness. There is no guarding or rebound.  Musculoskeletal: Normal range of motion.  Skin:    General: Skin is warm and dry.  Neurological:     Mental Status: He is alert and oriented to person, place, and time.     Comments: twitching distal extremities. No consistent with asterixis for sz-like actiivty       ED Treatments / Results  Labs (all labs ordered are listed, but only abnormal results are displayed) Labs Reviewed  CBC WITH  DIFFERENTIAL/PLATELET - Abnormal; Notable for the following components:      Result Value   WBC 11.8 (*)    RBC 3.47 (*)    Hemoglobin 10.4 (*)    HCT 33.5 (*)    Platelets 139 (*)    Neutro Abs 9.2 (*)    Monocytes Absolute 1.1 (*)    All other components within normal limits  I-STAT TROPONIN, ED - Abnormal; Notable for the following components:   Troponin i, poc 0.65 (*)    All other components within normal limits  COMPREHENSIVE METABOLIC PANEL  MAGNESIUM  LACTIC ACID, PLASMA  LACTIC ACID, PLASMA    EKG EKG Interpretation  Date/Time:  Thursday September 01 2018 12:14:04 EST Ventricular Rate:  94 PR Interval:    QRS Duration: 95 QT Interval:  380 QTC Calculation: 476 R Axis:   -107 Text Interpretation:  Sinus rhythm Left anterior fascicular block Consider left ventricular hypertrophy ST changes lateral leads similar to earlier today, new from prior ECGs Confirmed by Gareth Morgan [phone]) on 09/01/2018 12:57:05 PM   Radiology No results found.  Procedures .Critical Care Performed by: Franchot Heidelberg, PA-C Authorized by: Franchot Heidelberg, PA-C   Critical care provider statement:    Critical care time (minutes):  45   Critical care time was exclusive of:  Separately billable procedures and treating other patients and teaching time   Critical care was necessary to treat or prevent imminent or life-threatening deterioration of the following conditions:  Sepsis   Critical care was time spent personally by me on the following activities:  Blood draw for specimens, development of treatment plan with patient or surrogate, discussions with consultants, evaluation of patient's response to treatment, examination of patient, obtaining history from patient or surrogate, ordering and performing treatments and interventions, ordering and review of laboratory studies, pulse oximetry, re-evaluation of patient's condition, ordering and review of radiographic studies and review of old  charts   I assumed direction of critical care for this patient from another provider in my specialty: no   Comments:     Pt hypoxic at 80% due to bilateral pneumonia, abx started. Elevated potassium, insulin/dextrose and calcium given. Pt admitted   (including critical care time)  Medications Ordered  in ED Medications  acetaminophen (TYLENOL) tablet 650 mg (650 mg Oral Given 09/01/18 1214)     Initial Impression / Assessment and Plan / ED Course  I have reviewed the triage vital signs and the nursing notes.  Pertinent labs & imaging results that were available during my care of the patient were reviewed by me and considered in my medical decision making (see chart for details).     Pt presenting for evaluation of chest pain.  Concerning story per EMS, found gray and hypoxic at 80%.  On exam, patient appears ill.  EKG concerning for changes, but does not meet STEMI criteria.  Consider N STEMI, pneumonia, PE, pulmonary edema and need for emergent dialysis, electrolyte disorder.  Will obtain labs, chest x-ray, and reassess. Twitching may be due to fever, also consider electrolyte abnormality.   Labs concerning, troponin mildly elevated at 0.65.  Has been normal in the past.  Considering EKG changes and elevated troponin, will consult with cardiology, although consider demand ischemia/myocardial injury due to secondary cause.  Labs with mild leukocytosis at 11.8.  Hemoglobin stable at 10.  CMP, mag, and lactic pending. CXR pending. If CXR normal, consdier needs for CTA for pe rule out.   Chest x-ray shows bilateral pneumonia, cardiomegaly and pulmonary congestion.  As such, antibiotic started for HCAP PNA, draw blood cxultures and order flu. Will hold fluid resusuitaction considering pt's reassuring BP and ESRD. potassium elevated at 6.1, creatinine high but close to baseline.  Patient will likely need dialysis, but in the meantime we will start insulin/dextrose and calcium for hyperkalemia.  Will  consult with nephrology.   On reassessment, pt states his CP has improved, but is still mild/present. Twitching improved, fever improved  Cardiology evaluated patient, agrees that elevated troponin and EKG are likely secondary to pneumonia/fever and CKD.  Recommend serial troponins.  Discussed with Dr. Justin Mend from nephrology, will follow patient and perform dialysis as needed while patient is in the hospital.  Will call for admission.   Discussed with Dr. Tarry Kos from [facility] medicine, pt to be admitted.   Final Clinical Impressions(s) / ED Diagnoses   Final diagnoses:  Pneumonia of both lungs due to infectious organism, unspecified part of lung  Hypoxia  Elevated troponin  Abnormal EKG    ED Discharge Orders    None       Franchot Heidelberg, PA-C 09/01/18 1538    Gareth Morgan, MD 09/02/18 [phone]

## 2018-09-01 NOTE — Progress Notes (Signed)
FPTS Interim Progress Note  S: paged for patient being placed back on Bipap and still deatting to 88%. Nurse states patient is easy to arouse. Nurse expressed discomfort for patient's status for progressive level of care and requested escalation.  O: BP (!) 190/89 (BP Location: Right Arm)   Pulse (!) 101   Temp 100.3 F (37.9 C) (Oral)   Resp (!) 34   Ht 5\' 2"  (1.575 m)   Wt 71.9 kg Comment: pt stated was too weak  SpO2 93%   BMI 28.99 kg/m     A/P: Dr. Shawna Orleans to see patient Ordered stat ABG Paged [phone]  Richarda Osmond, DO 09/01/2018, 10:22 PM PGY-1, [place] [facility] Service pager [phone]

## 2018-09-01 NOTE — H&P (Addendum)
[facility] Admission History and Physical Service Pager: [phone]  Patient name: Blake Santana Medical record number: [id number] Date of birth: May 08, 1971 Age: 48 y.o. Gender: male  Primary Care Provider: Mack Hook, MD Consultants: Cardiology, Nephrology Code Status: Full  Chief Complaint: Chest Tightness  Assessment and Plan: Blake Santana is a 48 y.o. male presenting with chest pain and syncope. PMH is significant for ESRD with AV graft, T2DM, HLD, HTN, and iron deficiency anemia.  Atypical Chest Pain with elevated tropnonin d/t NSTEMI:  Concerning for possible NSTEMI. There are some change on the lateral leads. Troponin I 1.91. Cardiology aware. Patient presented with  non-radiating, non-pleuritic and nonexertional substernal chest tightness relieved with SL NTG and ASA given by EMS. Heart Score of 5, no prior CAD.  Patient also found to have CXR concerning for pneumonia with fever of 101.6 and leukocytosis of 11.8 with ANC of 9.2. Lactic acid WNL. Well's score of 0 making PE unlikely. Patient does have ESRD and did not attend scheduled dialysis this morning, so demand ischemia is possible though less likely given significant bump in troponin. Cardiology and Nephrology consulted in the ED.  Patient received Vancomycin/Cefepime in ED for CAP. - admit to progressive, attending Dr. Ardelia Mems - Cardiology consulted, continue to trend troponins, possible cath 2/3, starting hearin gtt for now - S/p ASA 325 and SL NTG x 1 by EMS  - Begin Azithromycin and CTX (1/20-) - Trend troponin, now then q6 hours - Follow up echo - Obtain BNP to evaluate fluid status - F/u AM CBC, BMP, PT/INR, PTT, Hgb A1C, Lipid panel, HIV, TSH - F/u repeat EKG qAm - continuous cardiac monitor, pulse ox, incentive spirometry - Continue home Lopressor 12.48m QD, Rosuvastatin 167mQD, ASA 8137mNorvasc 5mg83m - Nitrostat SL PRN for chest pain - Tylenol/Zofran  PRN  Acute hypoxic Respiratory Failure 2/2 CAP: Patient became hypoxic in ED to 88%, improved with supplemental oxygen with non-rebreather (currently on 10L O2). CXR significant for left > right basilar airspace disease concerning for pneumonia and pulmonary vascular congestion. Patient endorsed a productive cough of green/yellow sputum for 2-3 months. Exam consistent with crackles in L>R mid-lower lung fields bilaterally. Flu negative. Wells score 0, thus less likely PE. Missed HD today so excess volume could be contributing, although did not appear to be volume overloaded on exam (no JVD or LE edema). - continuous cardiac/pulse ox - Oxygen as needed, wean as tolerated - F/u Legionella, Strep Pneumo - S/p Vanc/Cefepime x 1 in ED - Begin Azithromycin and CTX (1/20-)  Hyperkalemia:  K elevated to 6.1 on admission (Baseline 4). Insulin/dextrose and calcium given in ED.  - f/u renal function panel this PM  ESRD on HD TTS: ESRD secondary to DM on HD [facility] a year  [facility]. EDW 64.5kg.  Nephro consulted. - Hemodialysis per nephrology - Avoid nephrotoxic agents - meds per nephrology - daily weights, strict I/O's  T2DM  Peripheral Neuropathy: Last A1C 7.4 on 08/26/18. CBG on admission 169. Home meds: Glipizide 5mg 37mand Gabapentin 300mg 2m- Hold home meds -sSSI - monitor CBGs - continue home gabapentin   HLD: Last lipid panel (08/26/18): Chol 131, HDL 43, LDL 65, Trig 113. Home meds: Crestor 10mg -20mtinue home meds  HTN: Elevated BP on admission 175/85. Home meds: Amlodipine 5mg QD,28mpressor 12.5mg BID 103montinue home meds - monitor vitals  H/o Iron Deficiency Anemia: Hgb on admission 10.4 (baseline 10-11).  -  continue to monitor  FEN/GI: NPO after midnight, MTV Prophylaxis: SQ heparin at this time  Disposition: pending cardiac workup and improvement from PNA  History of Present Illness:  Blake Santana is a 48 y.o. male presenting with  substernal chest tightness/pain.  Patient and wife provide history. They are spanish speaking.  Patient notes sudden onset worsening chest tightness starting this morning that remained constant. He noted some weakness this morning as well but denied any LOC. He describes the pain as "very tight" along lower ribs bilaterally. Patient received SL NTG and ASA by EMS which relieved the chest pain. He admits to currently have this chest tightness now. He notes that this pain did not radiate, is nonpleuritic, and did not worsen with exertion.   He notes that he has had this pain/tightness in the past but it was intermittent and lasted about an hour then goes away on its own. It would occur when he laid down flat and with exertion. He does note he has been more fatigued these past few days with more dizziness. He notes he has a chronic productive cough of yellow/green sputum since November. He was recently prophylacticly treated for the flu given his son was diagnosed with Influenza B in December. Otherwise, he has not been sick and no other sick contacts. He denies any nausea, vomiting, abdominal pain, fevers, chills. He received HD on TTS for ESRD and has been compliant except for today.  In the EMS, patient complained of chest pain and received SL NTG x 1 and ASA 361m which relieved the pain. He presented to the ED and work up included: CXR with L>R bibasilar airspace disease worrisome for pneumonia. EKG with lateral lead changes, elevated I-stat troponin of 0.65, white count to 11.8, stable hemoglobin of 10, K 6.1, Mag 2.5. He had a fever of 101.6, elevated blood pressure to 175/85, HR 99, and RR 14. Shortly after presentation he became hypoxic with tachypnea and was placed on a non-rebreather with resolution of symptoms.  Patient received insulin/dextrose and calcium for hyperkalemia and started on vanc/cefepime for HCAP. Cardiology and nephrology was consulted.  Review Of Systems: See HPI for  pertinent.  Review of Systems  Constitutional: Negative for chills and fever.  Respiratory: Positive for cough, sputum production and shortness of breath. Negative for hemoptysis and wheezing.   Cardiovascular: Positive for chest pain and orthopnea.  Gastrointestinal: Negative for abdominal pain, diarrhea, nausea and vomiting.  Skin: Negative for itching and rash.  Neurological: Positive for dizziness.    Patient Active Problem List   Diagnosis Date Noted  . Malnutrition of moderate degree 08/11/2017  . RSV (respiratory syncytial virus infection) 08/09/2017  . [place] (end stage renal disease) ([facility] 08/08/2017  . Acute CHF (congestive heart failure) ([facility] 04/03/2017  . Anemia of chronic kidney failure 03/11/2017  . Hypertension 03/11/2017  . Chronic kidney disease 03/11/2017  . Edema 01/14/2017  . Dyslipidemia 10/15/2016  . Diabetic retinopathy ([facility] 10/15/2016  . Microalbuminuria 06/18/2016  . Iron deficiency anemia 06/18/2016  . Uncontrolled type 2 diabetes mellitus with complication, without long-term current use of insulin ([facility] 10/20/2001    Past Medical History: Past Medical History:  Diagnosis Date  . Anemia of chronic kidney failure 03/11/2017  . Chronic kidney disease 03/11/2017  . Diabetes mellitus without complication ([place] [address] . Edema 01/14/2017  . Headache   . Hypercholesterolemia   . Hypertension 03/11/2017  . Iron deficiency anemia 06/18/2016  . RSV (respiratory syncytial virus infection) 08/2017  Past Surgical History: Past Surgical History:  Procedure Laterality Date  . A/V FISTULAGRAM Left 01/19/2018   Procedure: A/V FISTULAGRAM - Left AV;  Surgeon: Waynetta Sandy, MD;  Location: [facility];  Service: Cardiovascular;  Laterality: Left;  . AV FISTULA PLACEMENT Left 08/09/2017   Procedure: LEFT RADIOCEPHALIC ARTERIOVENOUS (AV) FISTULA CREATION;  Surgeon: [name], MD;  Location: [facility] OR;  Service: Vascular;  Laterality: Left;  . AV FISTULA  PLACEMENT Left 02/02/2018   Procedure: ARTERIOVENOUS (AV) FISTULA CREATION LEFT UPPER EXTREMITY;  Surgeon: [name], MD;  Location: [place];  Service: Vascular;  Laterality: Left;  . INSERTION OF DIALYSIS CATHETER  08/09/2017  . INSERTION OF DIALYSIS CATHETER Right 08/09/2017   Procedure: INSERTION OF DIALYSIS CATHETER RIGHT INTERNAL JUGULAR;  Surgeon: [name], MD;  Location: [facility] OR;  Service: Vascular;  Laterality: Right;  . IR REMOVAL TUN CV CATH W/O FL  07/01/2018    Social History: Social History   Tobacco Use  . Smoking status: Former Smoker    Types: Cigarettes  . Smokeless tobacco: Never Used  Substance Use Topics  . Alcohol use: Not Currently  . Drug use: No    Family History: Family History  Problem Relation Age of Onset  . Diabetes Mother   . Kidney disease Mother        renal failure--diabetes, cause of death  . Hypertension Mother   . Diabetes Father   . Kidney disease Father        Cause of death:  kidney failure form diabetes  . Alcohol abuse Father   . Diabetes Sister     Allergies and Medications: No Known Allergies No current facility-administered medications on file prior to encounter.    Current Outpatient Medications on File Prior to Encounter  Medication Sig Dispense Refill  . amLODipine (NORVASC) 5 MG tablet 1 tab by mouth daily in the morning with breakfast 30 tablet 11  . b complex-vitamin c-folic acid (NEPHRO-VITE) 0.8 MG TABS tablet TAKE 1 TABLET BY MOUTH EVERY DAY (ON DIALYSIS DAYS, TAKE AFTER DIALYSIS TREATMENT)    . Blood Glucose Monitoring Suppl (AGAMATRIX PRESTO) w/Device KIT Check sugars twice daily (Patient not taking: Reported on 08/17/2018) 1 kit 0  . calcium acetate (PHOSLO) 667 MG capsule 2 caps by mouth three times daily with meals (Patient not taking: Reported on 08/17/2018)    . cetirizine (ZYRTEC) 10 MG tablet 1 tab by mouth daily as needed for allergies or cough 30 tablet 11  . gabapentin (NEURONTIN) 300 MG capsule 1 cap by  mouth at bedtime. 30 capsule 11  . glipiZIDE (GLUCOTROL) 5 MG tablet 1 tab by mouth twice daily with meals 60 tablet 11  . glucose blood (AGAMATRIX PRESTO TEST) test strip Check sugars twice daily 100 each 12  . metoprolol tartrate (LOPRESSOR) 25 MG tablet 1/2 tab by mouth twice daily with meals 30 tablet 11  . oseltamivir (TAMIFLU) 75 MG capsule 1 cap by mouth daily until everyone in household symptom free (Patient not taking: Reported on 08/17/2018) 10 capsule 1  . rosuvastatin (CRESTOR) 10 MG tablet 1 tab by mouth daily with evening meal 90 tablet 11  . sucroferric oxyhydroxide (VELPHORO) 500 MG chewable tablet Chew 500 mg by mouth 3 (three) times daily with meals.      Objective: BP (!) 175/85 (BP Location: Right Arm)   Pulse 99   Temp 99.4 F (37.4 C) (Oral)   Resp 14   Wt 66 kg  SpO2 100%   BMI 25.77 kg/m  Exam: General: well nourished, well developed, in no acute distress with non-toxic appearance, lying comfortably in ED bed with non-rebreather HEENT: normocephalic, atraumatic, moist mucous membranes Neck: supple, normal ROM, absent JVD CV: regular rate and rhythm without murmurs, rubs, or gallops, no lower extremity edema, <2 sec cap refill, 2+ radial and pedal pulses bilaterally Lungs: crackles in mid and lower lobes bilaterally, normal work of breathing on 8L nonrebreather Abdomen: soft, non-tender, non-distended, normoactive bowel sounds Skin: warm, dry, no rashes or lesions, feels warm and clammy Extremities: warm and well perfused, normal tone MSK: chest nontender to palpation Neuro: Alert and oriented, speech normal (spanish speaking)  Labs and Imaging: CBC BMET  Recent Labs  Lab 09/01/18 1216  WBC 11.8*  HGB 10.4*  HCT 33.5*  PLT 139*   Recent Labs  Lab 09/01/18 1216  NA 136  K 6.1*  CL 99  CO2 26  BUN 66*  CREATININE 9.99*  GLUCOSE 184*  CALCIUM 9.1     Blood culture: pending  EKG: NSR with lateral T wave depression, more pronounced than last   Year  Troponin: 0.65 Na 136, K 6.1, BUN 66, Cr 9.99, Mag 2.5 WBC 11.8, Hgb 10.4, Plts 139 Flu: negative Magnesium: 2.5 (h) Lactic acid: 1.1>1.5 Blood culture: pending I-stat troponin: 0.65 Troponin: 1.91  Dg Chest Portable 1 View  Result Date: 09/01/2018 CLINICAL DATA:  Shortness of breath, chest pain and fever. EXAM: PORTABLE CHEST 1 VIEW COMPARISON:  PA and lateral chest 03/20/2018. Single-view of the chest 08/09/2017. FINDINGS: There is left worse than right basilar airspace disease. Cardiomegaly and vascular congestion noted. No pneumothorax or pleural effusion. No acute or focal bony abnormality. IMPRESSION: Left worse than right basilar airspace disease worrisome for pneumonia. Cardiomegaly and pulmonary vascular congestion. Electronically Signed   By: Inge Rise M.D.   On: 09/01/2018 13:27    Danna Hefty, DO 09/01/2018, 2:16 PM PGY-1, [place] [facility] pager: [phone], text pages welcome

## 2018-09-01 NOTE — Progress Notes (Addendum)
FPTS Interim Progress Note  S: went to bedside to evaluate patient. Nurse in room was about to page about patient's temp and BP elevated. Patient on 10L NRB.   O: BP (!) 190/89 (BP Location: Right Arm)   Pulse (!) 107   Temp 100.3 F (37.9 C) (Oral)   Resp (!) 25   Ht 5\' 2"  (1.575 m)   Wt 71.9 kg Comment: pt stated was too weak  SpO2 100%   BMI 28.99 kg/m    General: patient laying in bed on NRB 10L, able to follow commands, speak in couple word sentences Resp: wheezing bilaterally, worse on left, no crackles. Accessory muscle use Heart: tachy, no murmur appreciated Extremities: no LE edema   A/P: Tylenol IV lopressor (home evening dose was not given) duoneb Nurse calling dialysis to see what time they are going to take him and if they can get him in sooner- they cannot take patient until about 0300.  Richarda Osmond, DO 09/01/2018, 8:41 PM PGY-1, [place] Medicine Service pager [phone]

## 2018-09-01 NOTE — Progress Notes (Addendum)
ANTICOAGULATION CONSULT NOTE - Initial Consult  Pharmacy Consult for Heparin Indication: chest pain/ACS/NSTEMI  No Known Allergies  Patient Measurements: Height: 5\' 2"  (157.5 cm) Weight: 158 lb 8.2 oz (71.9 kg)(pt stated was too weak) IBW/kg (Calculated) : 54.6 Heparin Dosing Weight: 71.9 kg  Vital Signs: Temp: 97.5 F (36.4 C) (01/30 1652) Temp Source: Axillary (01/30 1652) BP: 166/80 (01/30 1652) Pulse Rate: 85 (01/30 1652)  Labs: Recent Labs    09/01/18 1216 09/01/18 1516  HGB 10.4*  --   HCT 33.5*  --   PLT 139*  --   CREATININE 9.99*  --   TROPONINI  --  1.91*    Medical History: Past Medical History:  Diagnosis Date  . Anemia of chronic kidney failure 03/11/2017  . Chronic kidney disease 03/11/2017  . Diabetes mellitus without complication ([place]) 0102  . Edema 01/14/2017  . Headache   . Hypercholesterolemia   . Hypertension 03/11/2017  . Iron deficiency anemia 06/18/2016  . RSV (respiratory syncytial virus infection) 08/2017   Assessment:  48 yr old male admitted with chest tightness and concern for NSTEMI.  Troponin up 1.19.  To begin IV heparin per Dr. Theodosia Blender request. Planning echo in am and possible cardiac cath next week, after afebrile for 48 hrs.  Spoke with Dr. Yisroel Ramming, as initial plan was SQ heparin. Baseline platelet count low, will monitor daily.    Vancomycin and Cefepime begun in ED for HCAP, now changed to Cetriaxone and Azithromycin for CAP.  Planning hemodialysis tonight, per usual TTS schedule.  Goal of Therapy:  Heparin level 0.3-0.7 units/ml Monitor platelets by anticoagulation protocol: Yes   Plan:   Heparin 4000 units IV x 1  Heparin drip to begin at 900 units/hr  Heparin level ~8 hrs after drip begins.  Daily heparin level and CBC.  Arty Baumgartner, [place] Pager: [phone] 09/01/2018,7:19 PM

## 2018-09-01 NOTE — Progress Notes (Addendum)
FPTS Interim Progress Note  Patient feels better on Bipap. Denies CP. Brother and sister in law at bedside.   O: BP (!) 190/89 (BP Location: Right Arm)   Pulse (!) 101   Temp 100.3 F (37.9 C) (Oral)   Resp (!) 34   Ht 5\' 2"  (1.575 m)   Wt 71.9 kg Comment: pt stated was too weak  SpO2 93%   BMI 28.99 kg/m   General: appears uncomfortable, bipap in place CV: Tachycardic, S1 and S2. No murmurs Lungs: Diffuse crackles and rhonchi, increased WOB on bipap Neuro: alert and awake  BNP    Component Value Date/Time   BNP 4,237.8 (H) 09/01/2018 1856    Troponins 1.91 >2.45   A/P: Acute Respiratory Failure 2/2 CAP and now with worsening respiratory status due to pulm edema on bipap. Unfortunately is ESRD and does not make urine so diuresis is not an option - ABG ordered - consult CCM - Needs HD, earliest available is 3am.  NSTEMI with continued troponin rise. Chest pain free.  - continue heparin gtt - EKG - Discussed with cards, agree with need for HD, no additional recommendations at this time.   Bufford Lope, DO 09/01/2018, 10:28 PM PGY-3, [place] Medicine Service pager [phone]

## 2018-09-02 ENCOUNTER — Inpatient Hospital Stay (HOSPITAL_COMMUNITY): Payer: Medicaid Other

## 2018-09-02 DIAGNOSIS — J9601 Acute respiratory failure with hypoxia: Secondary | ICD-10-CM

## 2018-09-02 DIAGNOSIS — I361 Nonrheumatic tricuspid (valve) insufficiency: Secondary | ICD-10-CM

## 2018-09-02 DIAGNOSIS — E1122 Type 2 diabetes mellitus with diabetic chronic kidney disease: Secondary | ICD-10-CM

## 2018-09-02 DIAGNOSIS — J810 Acute pulmonary edema: Secondary | ICD-10-CM

## 2018-09-02 DIAGNOSIS — E8770 Fluid overload, unspecified: Secondary | ICD-10-CM | POA: Diagnosis present

## 2018-09-02 DIAGNOSIS — J90 Pleural effusion, not elsewhere classified: Secondary | ICD-10-CM

## 2018-09-02 DIAGNOSIS — I5043 Acute on chronic combined systolic (congestive) and diastolic (congestive) heart failure: Secondary | ICD-10-CM

## 2018-09-02 DIAGNOSIS — E8779 Other fluid overload: Secondary | ICD-10-CM

## 2018-09-02 DIAGNOSIS — D649 Anemia, unspecified: Secondary | ICD-10-CM

## 2018-09-02 DIAGNOSIS — J181 Lobar pneumonia, unspecified organism: Secondary | ICD-10-CM

## 2018-09-02 DIAGNOSIS — N186 End stage renal disease: Secondary | ICD-10-CM

## 2018-09-02 DIAGNOSIS — I340 Nonrheumatic mitral (valve) insufficiency: Secondary | ICD-10-CM

## 2018-09-02 LAB — GLUCOSE, CAPILLARY
Glucose-Capillary: 180 mg/dL — ABNORMAL HIGH (ref 70–99)
Glucose-Capillary: 213 mg/dL — ABNORMAL HIGH (ref 70–99)
Glucose-Capillary: 270 mg/dL — ABNORMAL HIGH (ref 70–99)
Glucose-Capillary: 147 mg/dL — ABNORMAL HIGH (ref 70–99)

## 2018-09-02 LAB — RENAL FUNCTION PANEL
Albumin: 2.8 g/dL — ABNORMAL LOW (ref 3.5–5.0)
Albumin: 2.8 g/dL — ABNORMAL LOW (ref 3.5–5.0)
Anion gap: 12 (ref 5–15)
Anion gap: 14 (ref 5–15)
BUN: 85 mg/dL — ABNORMAL HIGH (ref 6–20)
BUN: 25 mg/dL — ABNORMAL HIGH (ref 6–20)
CHLORIDE: 101 mmol/L (ref 98–111)
CO2: 25 mmol/L (ref 22–32)
CO2: 28 mmol/L (ref 22–32)
CREATININE: 5.98 mg/dL — AB (ref 0.61–1.24)
Calcium: 9.2 mg/dL (ref 8.9–10.3)
Calcium: 8.8 mg/dL — ABNORMAL LOW (ref 8.9–10.3)
Chloride: 101 mmol/L (ref 98–111)
Creatinine, Ser: 11.67 mg/dL — ABNORMAL HIGH (ref 0.61–1.24)
GFR calc Af Amer: 5 mL/min — ABNORMAL LOW (ref >60)
GFR calc Af Amer: 12 mL/min — ABNORMAL LOW (ref >60)
GFR calc non Af Amer: 5 mL/min — ABNORMAL LOW (ref >60)
GFR, EST NON AFRICAN AMERICAN: 10 mL/min — AB (ref >60)
GLUCOSE: 284 mg/dL — AB (ref 70–99)
Glucose, Bld: 213 mg/dL — ABNORMAL HIGH (ref 70–99)
Phosphorus: 3.2 mg/dL (ref 2.5–4.6)
Phosphorus: 3.6 mg/dL (ref 2.5–4.6)
Potassium: 6.9 mmol/L (ref 3.5–5.1)
Potassium: 4.7 mmol/L (ref 3.5–5.1)
Sodium: 138 mmol/L (ref 135–145)
Sodium: 143 mmol/L (ref 135–145)

## 2018-09-02 LAB — TROPONIN I: Troponin I: 2.22 ng/mL (ref <0.03)

## 2018-09-02 LAB — CBC
HEMATOCRIT: 32.8 % — AB (ref 39.0–52.0)
Hemoglobin: 10.3 g/dL — ABNORMAL LOW (ref 13.0–17.0)
MCH: 30.1 pg (ref 26.0–34.0)
MCHC: 31.4 g/dL (ref 30.0–36.0)
MCV: 95.9 fL (ref 80.0–100.0)
Platelets: 123 10*3/uL — ABNORMAL LOW (ref 150–400)
RBC: 3.42 MIL/uL — ABNORMAL LOW (ref 4.22–5.81)
RDW: 14.6 % (ref 11.5–15.5)
WBC: 14.3 10*3/uL — ABNORMAL HIGH (ref 4.0–10.5)
nRBC: 0 % (ref 0.0–0.2)

## 2018-09-02 LAB — HEPARIN LEVEL (UNFRACTIONATED)
Heparin Unfractionated: 0.18 IU/mL — ABNORMAL LOW (ref 0.30–0.70)
Heparin Unfractionated: <0.1 IU/mL — ABNORMAL LOW (ref 0.30–0.70)
Heparin Unfractionated: 0.19 IU/mL — ABNORMAL LOW (ref 0.30–0.70)

## 2018-09-02 LAB — ECHOCARDIOGRAM COMPLETE
Height: 62 in
WEIGHTICAEL: 2500.9 [oz_av]

## 2018-09-02 LAB — PROTIME-INR
INR: 1.26
Prothrombin Time: 15.6 seconds — ABNORMAL HIGH (ref 11.4–15.2)

## 2018-09-02 LAB — PROCALCITONIN: Procalcitonin: 3.72 ng/mL

## 2018-09-02 LAB — HIV ANTIBODY (ROUTINE TESTING W REFLEX): HIV Screen 4th Generation wRfx: NONREACTIVE

## 2018-09-02 LAB — APTT: aPTT: 65 seconds — ABNORMAL HIGH (ref 24–36)

## 2018-09-02 MED ORDER — NITROGLYCERIN IN D5W 200-5 MCG/ML-% IV SOLN
0.0000 ug/min | INTRAVENOUS | Status: DC
  Administered 2018-09-02: 5 ug/min via INTRAVENOUS
  Administered 2018-09-06: 20 ug/min via INTRAVENOUS
  Filled 2018-09-02 (×2): qty 250

## 2018-09-02 MED ORDER — METOPROLOL TARTRATE 12.5 MG HALF TABLET: 12.5000 mg | ORAL_TABLET | Freq: Once | ORAL | Status: DC

## 2018-09-02 MED ORDER — KETOROLAC TROMETHAMINE 15 MG/ML IJ SOLN
15.0000 mg | Freq: Once | INTRAMUSCULAR | Status: AC
  Administered 2018-09-02: 15 mg via INTRAVENOUS
  Filled 2018-09-02: qty 1

## 2018-09-02 MED ORDER — VANCOMYCIN HCL IN DEXTROSE 750-5 MG/150ML-% IV SOLN
750.0000 mg | INTRAVENOUS | Status: DC
  Administered 2018-09-03: 750 mg via INTRAVENOUS
  Filled 2018-09-02: qty 150

## 2018-09-02 MED ORDER — ROSUVASTATIN CALCIUM 20 MG PO TABS
20.0000 mg | ORAL_TABLET | Freq: Every day | ORAL | Status: DC
  Administered 2018-09-02 – 2018-09-07 (×6): 20 mg via ORAL
  Filled 2018-09-02 (×6): qty 1

## 2018-09-02 MED ORDER — METOPROLOL TARTRATE 25 MG PO TABS
25.0000 mg | ORAL_TABLET | Freq: Two times a day (BID) | ORAL | Status: DC
  Administered 2018-09-02 – 2018-09-07 (×10): 25 mg via ORAL
  Filled 2018-09-02 (×10): qty 1

## 2018-09-02 MED ORDER — VANCOMYCIN HCL IN DEXTROSE 1-5 GM/200ML-% IV SOLN
1000.0000 mg | Freq: Once | INTRAVENOUS | Status: AC
  Administered 2018-09-02: 1000 mg via INTRAVENOUS
  Filled 2018-09-02: qty 200

## 2018-09-02 MED ORDER — NEPRO/CARBSTEADY PO LIQD
237.0000 mL | Freq: Two times a day (BID) | ORAL | Status: DC
  Administered 2018-09-02 – 2018-09-07 (×5): 237 mL via ORAL

## 2018-09-02 MED ORDER — CHLORHEXIDINE GLUCONATE CLOTH 2 % EX PADS
6.0000 | MEDICATED_PAD | Freq: Every day | CUTANEOUS | Status: DC
  Administered 2018-09-03 – 2018-09-07 (×4): 6 via TOPICAL

## 2018-09-02 MED ORDER — DOXERCALCIFEROL 4 MCG/2ML IV SOLN
INTRAVENOUS | Status: AC
  Administered 2018-09-02: 3 ug via INTRAVENOUS
  Filled 2018-09-02: qty 2

## 2018-09-02 MED ORDER — SODIUM CHLORIDE 0.9 % IV SOLN
2.0000 g | Freq: Once | INTRAVENOUS | Status: AC
  Administered 2018-09-02: 2 g via INTRAVENOUS
  Filled 2018-09-02: qty 2

## 2018-09-02 MED ORDER — SODIUM CHLORIDE 0.9 % IV SOLN
2.0000 g | INTRAVENOUS | Status: DC
  Administered 2018-09-03: 2 g via INTRAVENOUS
  Filled 2018-09-02: qty 2

## 2018-09-02 NOTE — Progress Notes (Signed)
[facility] Progress Note   Subjective:   Seen in room, s/p HD overnight with 3L removed. Still on bi-pap, sats dropped with 3L nasal O2. Pt c/o headache - no CP at this time.  Objective Vitals:   09/02/18 0730 09/02/18 0758 09/02/18 0850 09/02/18 0931  BP: (!) 153/131 (!) 177/123  (!) 176/75  Pulse: (!) 111 (!) 113 (!) 112 99  Resp: (!) 25 (!) 25 (!) 21 (!) 30  Temp:    (!) 102.3 F (39.1 C)  TempSrc:  Oral  Axillary  SpO2:  100% 99% 100%  Weight:      Height:       Physical Exam General: Well appearing man, sitting upright in bed, still on bi-pap. Heart: RRR; no murmur Lungs: Clear in upper lobes, + bibasilar rales present Abdomen: soft, non-tender Extremities: No LE edema Dialysis Access: AVF + thrill  Additional Objective Labs: Basic Metabolic Panel: Recent Labs  Lab 09/01/18 1216 09/01/18 1904 09/02/18 0219  NA 136 135 138  K 6.1* 5.4* 6.9*  CL 99 94* 101  CO2 26 26 25   GLUCOSE 184* 188* 284*  BUN 66* 70* 85*  CREATININE 9.99* 10.48* 11.67*  CALCIUM 9.1 9.8 9.2  PHOS  --  3.5 3.2   Liver Function Tests: Recent Labs  Lab 09/01/18 1216 09/01/18 1904 09/02/18 0219  AST 16  --   --   ALT 14  --   --   ALKPHOS 63  --   --   BILITOT 0.7  --   --   PROT 6.8  --   --   ALBUMIN 3.3* 3.2* 2.8*   CBC: Recent Labs  Lab 09/01/18 1216 09/01/18 1856 09/02/18 0219  WBC 11.8* 12.5* 14.3*  NEUTROABS 9.2*  --   --   HGB 10.4* 10.9* 10.3*  HCT 33.5* 34.1* 32.8*  MCV 96.5 96.3 95.9  PLT 139* 141* 123*   Blood Culture    Component Value Date/Time   SDES BLOOD RIGHT ANTECUBITAL 09/01/2018 1353   SPECREQUEST  09/01/2018 1353    BOTTLES DRAWN AEROBIC AND ANAEROBIC Blood Culture results may not be optimal due to an excessive volume of blood received in culture bottles   CULT  09/01/2018 1353    NO GROWTH < 24 HOURS Performed at [facility] [address] [place], [place] [address]    REPTSTATUS PENDING 09/01/2018 1353    Cardiac  Enzymes: Recent Labs  Lab 09/01/18 1516 09/01/18 1856 09/02/18 0219  TROPONINI 1.91* 2.45* 2.22*   CBG: Recent Labs  Lab 09/01/18 1740 09/01/18 2059 09/02/18 0927 09/02/18 1056  GLUCAP 169* 199* 180* 213*   Studies/Results: Dg Chest Portable 1 View  Result Date: 09/01/2018 CLINICAL DATA:  Shortness of breath, chest pain and fever. EXAM: PORTABLE CHEST 1 VIEW COMPARISON:  PA and lateral chest 03/20/2018. Single-view of the chest 08/09/2017. FINDINGS: There is left worse than right basilar airspace disease. Cardiomegaly and vascular congestion noted. No pneumothorax or pleural effusion. No acute or focal bony abnormality. IMPRESSION: Left worse than right basilar airspace disease worrisome for pneumonia. Cardiomegaly and pulmonary vascular congestion. Electronically Signed   By: Inge Rise M.D.   On: 09/01/2018 13:27   Medications: . sodium chloride    . sodium chloride    . sodium chloride    . cefTRIAXone (ROCEPHIN)  IV Stopped (09/01/18 2158)  . heparin 1,100 Units/hr (09/02/18 1052)  . nitroGLYCERIN     . amLODipine  5 mg Oral Q breakfast  .  aspirin EC  81 mg Oral Daily  . azithromycin  500 mg Oral Q24H  . doxercalciferol  3 mcg Intravenous Q T,Th,Sa-HD  . gabapentin  300 mg Oral QHS  . insulin aspart  0-9 Units Subcutaneous TID WC  . metoprolol tartrate  12.5 mg Oral Once  . metoprolol tartrate  25 mg Oral BID WC  . multivitamin  1 tablet Oral QHS  . rosuvastatin  20 mg Oral q1800  . sodium chloride flush  3 mL Intravenous Q12H  . sucroferric oxyhydroxide  1,000 mg Oral TID WC    Dialysis Orders: TTS [facility] 3.75 hours EDW 64.5 2K 2 Ca heparin 2000 Hectorol 3 no ESA/Fe Recent labs: hgb 12.6 last Mircera 50 on 1/07 42% sat iPTH 279  Assessment/Plan: 1. Acute hypoxic respiratory failure with fever: HCAP +/- volume excess. Flu negative. BCx pending. ^ Pro-calcitonin and BNP. On Ceftriaxone + azithromycin. 2. NSTEMI: + CP on admit, Trop peaked 2.45, now trending  down. Cardiology following. For LHC next week once afebrile. 3. Hyperkalemia: S/p HD overnight with 1K bath. Follow. 4. ESRD: Usually TTS schedule. S/p HD last night - still requiring Bi-pap today. Will plan for another HD later today for further volume removal to see if can wean from Bi-pap. 5. Hypertension/volume: Variable weights here, still volume up on exam. 6. Anemia of ESRD: Hgb 10.3 - likely resume ESA soon. 7. Metabolic bone disease: Corr Ca/Phos fine. Continue binders (Velphoro) and VDRA. 8. Nutrition: Alb low. 9. DM: On insulin, per primary.  Veneta Penton, PA-C 09/02/2018, 11:10 AM  [employer] Pager: [phone]

## 2018-09-02 NOTE — Progress Notes (Signed)
HD tx initiated via 15Gx2 w/o problem Pull/push/flush well w/o problem VSS Will continue to monitor while on HD tx 

## 2018-09-02 NOTE — Progress Notes (Signed)
Pharmacy Antibiotic Note  Blake Santana is a 48 y.o. male admitted on 09/01/2018 with pneumonia.  Patient received Vancomycin and Cefepime x 1 dose in the ED on 1/30 but was changed to Rocephin/Azithro for presumed CAP on admission.  Since admission, patient has continued to spike fevers.  Pharmacy has been asked to broaden antibiotic regimen with Vancomycin and Cefepime dosing.  Of note, pt has ESRD and typically received dialysis on TTS.  Last HD session was early morning 1/31.  Expect next HD session 2/1 to return to TTS schedule.  Plan: Vancomycin 1gm IV x 1 now, followed by 750mg  IV qHD session (start 2/1). Cefepime 2gm IV qHD - first dose now. Follow-up fever curve, WBC, and clinical progress. Follow-up HD schedule. Follow-up culture data and adjust abx as needed. Vancomycin pre-HD level goal 20-25.  Height: 5\' 2"  (157.5 cm) Weight: 156 lb 4.9 oz (70.9 kg)(not standing d/t pt now on bipap) IBW/kg (Calculated) : 54.6  Temp (24hrs), Avg:101.1 F (38.4 C), Min:97.5 F (36.4 C), Max:103.3 F (39.6 C)  Recent Labs  Lab 09/01/18 1216 09/01/18 1221 09/01/18 1348 09/01/18 1856 09/01/18 1904 09/02/18 0219 09/02/18 1225  WBC 11.8*  --   --  12.5*  --  14.3*  --   CREATININE 9.99*  --   --   --  10.48* 11.67* 5.98*  LATICACIDVEN  --  1.1 1.5  --   --   --   --     Estimated Creatinine Clearance: 12.9 mL/min (A) (by C-G formula based on SCr of 5.98 mg/dL (H)).    No Known Allergies  Antimicrobials this admission: Vanc x 1 on 1/30, restart 1/31 >> Cefepime x 1 on 1/30, restart 1/31 >> Ceftriaxone 1/30>>1/31 Azithro PO 1/30>>1/31  Dose adjustments this admission:   Microbiology results: 1/30 BCx x 2 >>  Thank you for allowing pharmacy to be a part of this patient's care.  [employer], Pharm.D., BCPS Clinical Pharmacist  **Pharmacist phone directory can now be found on [email or web address] ([email or web address]).  Listed under [place].  09/02/2018 3:39 PM

## 2018-09-02 NOTE — Progress Notes (Signed)
Family Medicine Teaching Service Daily Progress Note Intern Pager: [phone]  Patient name: Blake Santana Medical record number: [id number] Date of birth: March 27, 1971 Age: 48 y.o. Gender: male  Primary Care Provider: Mack Hook, MD Consultants: Cardiology, Nephrology Code Status: Full  Pt Overview and Major Events to Date:  1/30 admitted to [facility], heparin dtt, Vanc/Cefepime x1 (1/30) in ED for HCAP, Azithro/CTX (1/30-) for CAP, K+ 6.1 s/p D5/insulin/calcium in ED 1/31: febrile to t-max 103.3, HD, d/c Azithro/CTX (1/30-1/31), begin Vanc/Cefepime (1/31-)  Assessment and Plan: Blake Santana is a 48 y.o. male presenting with atypical chest pain, weakness, orthopnea, and fever. PMH is significant for ESRD with AV graft, T2DM, HLD, HTN, and iron deficiency anemia.  Atypical Chest Pain with elevated troponin d/t NSTEMI: Uptrending troponins overnight to 2.45. Initiated on heparin drip and taken to HD overnight given BNP 4237.8. Volume overload and PNA may be contributing. Troponin I was stable at 2.22 this morning. Repeat EKG with NSR without signs of ST changes. Echo this morning. Notes mild substernal chest tightness this morning. - Per cardiology, given acute infection, likely cath once afebrile >48 hours (most likely 2/3), continue heparin dtt, increase Lopressor to 25mg  BID - Continue ASA 81mg , Crestor 20mg , Norvasc 5mg  QD - follow up Echo, AM EKG's - continuous cardiac monitor, pulse ox, incentive spirometry - Nitrostat SL PRN for chest pain - Tylenol/Zofran PRN - Continue plan for PNA and Volume status below  Acute hypoxic Respiratory Failure 2/2 CAP and Pulmonary Edema: Transferred to BiPAP overnight d/t worsening hypoxia (79-83%) with improvement in O2 sats. Duenoeb x 1 also given ON. ABG without hypercarbia or acidosis Febrile to tmax 103.3, with hypertension (170's SBP) and tachycardia (low 100's). Worsening leukocytosis 11.8>14.3. Elevated procalcitonin, although not  surprised given known PNA. CCM consulted. Patient received HD overnight. 3L off so far. On BiPAP this AM and breathing comfortably, lung exam unchanged from day prior. 2/2 Blood cx neg <24 hours. Currently on CTX/Azithromycin for CAP. - Per CCM, pt stable on BiPAP, no transfer to ICU at this time, signed off - Discontinue Azithro/CTX (1/30-1/31) - Begin Vanc/Cefepime (1/31-) - continue respiratory support with BiPAP as needed, wean as tolerated, goal O2 >94% - follow up blood culture - f/u sputum cx once obtained  - trend WBC/fever curve - Tylenol PRN for fever/pain - given anuric, likely unable to obtain strep/legionella  Hyperkalemia:  K+ max ON was 6.9. Received HD ON. K+ this AM pending.  - S/p Insulin/dextrose and calcium in ED, s/p HD - electrolyte management per nephro - monitor closely  ESRD on HD TTS: ESRD secondary to DM on HD for a year at [facility]. BNP elevated 4237.8. Was in HD this morning, 3L off so far.   -Hemodialysis per nephrology -Avoid nephrotoxic agents - meds per nephrology - daily weights, strict I/O's  T2DM  Peripheral Neuropathy: Last A1C 7.4 on 08/26/18. Home meds: Glipizide 5mg  QD and Gabapentin 300mg  qHS. CBG ON: 160-200 - Hold home Glipizide - sSSI - monitor CBGs - continue home gabapentin   HLD: Last lipid panel (08/26/18): Chol 131, HDL 43, LDL 65, Trig 113. Home meds: Crestor 10mg  - Crestor increased to 20mg  per cards  HTN: BP elevated ON (140-160's SBP), improved this AM to 114/67. Home meds: Amlodipine 5mg  QD, Lopressor 12.5mg  BID - continue home meds - monitor vitals  H/o Iron Deficiency Anemia: stable Hgb 10.4>10.9>10.3 (baseline 10-11).  - continue to monitor - ESA per nephro  FEN/GI: NPO until off BiPAP, MTV Prophylaxis: Heparin  Dtt  Disposition: pending cardiac workup and improvement from PNA  Subjective:  Notes continued mild substernal chest tightness this AM but improved from yesterday, a frontal headache, and his LE  neuropathy is bothering him. Otherwise is feeling better, breathing more comfortably with BIPAP. Denies any vision changes, abdominal pain, N/V.   Objective: Temp:  [97.5 F (36.4 C)-103.3 F (39.6 C)] 100.4 F (38 C) (01/31 0337) Pulse Rate:  [76-107] 92 (01/31 0530) Resp:  [14-34] 19 (01/31 0530) BP: (148-190)/(73-90) 155/84 (01/31 0530) SpO2:  [92 %-100 %] 100 % (01/31 0530) FiO2 (%):  [80 %-100 %] 80 % (01/31 0600) Weight:  [66 kg-71.9 kg] 70.9 kg (01/31 0337) Physical Exam: General: well nourished, well developed, in no acute distress with non-toxic appearance on BiPAP lying comfortably in HD this AM HEENT: normocephalic, atraumatic CV: regular rate and rhythm without murmurs, rubs, or gallops Lungs: clear to auscultation bilaterally with normal work of breathing  Abdomen: soft, non-tender, non-distended, normoactive bowel sounds Skin: warm, dry, no rashes or lesions Neuro: Alert and oriented, speech normal  Laboratory: Recent Labs  Lab 09/01/18 1216 09/01/18 1856 09/02/18 0219  WBC 11.8* 12.5* 14.3*  HGB 10.4* 10.9* 10.3*  HCT 33.5* 34.1* 32.8*  PLT 139* 141* 123*   Recent Labs  Lab 08/26/18 0951 09/01/18 1216 09/01/18 1904 09/02/18 0219  NA 144 136 135 138  K 6.1* 6.1* 5.4* 6.9*  CL 104 99 94* 101  CO2 26 26 26 25   BUN 28* 66* 70* 85*  CREATININE 7.03* 9.99* 10.48* 11.67*  CALCIUM 9.4 9.1 9.8 9.2  PROT 7.4 6.8  --   --   BILITOT 0.3 0.7  --   --   ALKPHOS 83 63  --   --   ALT 12 14  --   --   AST 10 16  --   --   GLUCOSE 131* 184* 188* 284*   EKG: NSR with lateral T wave depression, more pronounced than last  Year  EKG 2: NSR with nonspecific ST changes Troponin: 0.65 Na 136, K 6.1>5.4>6.9, BUN 66, Cr 9.99, Mag 2.5 WBC 11.8>12.5>14.3, Hgb 10.4>10.9>10.3, Plts 034>742>595 Flu: negative Magnesium: 2.5 (h) Lactic acid: 1.1>1.5 Blood culture: pending I-stat troponin: 0.65 Troponin: 1.91>2.45>2.22 TSH 1.174 BNP 4237.8 ABG: pH 7.438, pCO2 65.6,  bicarb 28.1 procalcitonin 3.72 PT/INR: 15.6/1.26  Pending: Legionella: Step Pneumo:  Sputum Culture:  Blood cx x2 HIV  Imaging/Diagnostic Tests: Dg Chest Portable 1 View  Result Date: 09/01/2018 CLINICAL DATA:  Shortness of breath, chest pain and fever. EXAM: PORTABLE CHEST 1 VIEW COMPARISON:  PA and lateral chest 03/20/2018. Single-view of the chest 08/09/2017. FINDINGS: There is left worse than right basilar airspace disease. Cardiomegaly and vascular congestion noted. No pneumothorax or pleural effusion. No acute or focal bony abnormality. IMPRESSION: Left worse than right basilar airspace disease worrisome for pneumonia. Cardiomegaly and pulmonary vascular congestion. Electronically Signed   By: Inge Rise M.D.   On: 09/01/2018 13:27   Danna Hefty, DO 09/02/2018, 6:05 AM PGY-1, [place] Intern pager: [phone], text pages welcome

## 2018-09-02 NOTE — Progress Notes (Signed)
Patient temp 103.3 rectally.  MD notified.  No new order at this time. Awaiting HD.

## 2018-09-02 NOTE — Progress Notes (Signed)
RT Note: Trial patient off BIPAP. Patient destated  To 77% even with 3L nasal canula. Placed patient on BIPAP previous settings. Patient recovered quickly vital signs stable at this time. RT and RN transported patient from [facility] to [phone]. RN and patients RT aware.

## 2018-09-02 NOTE — Progress Notes (Signed)
ANTICOAGULATION CONSULT NOTE - Follow-Up   Pharmacy Consult for Heparin Indication: chest pain/ACS/NSTEMI  No Known Allergies  Patient Measurements: Height: 5\' 2"  (157.5 cm) Weight: 150 lb 2.1 oz (68.1 kg) IBW/kg (Calculated) : 54.6 Heparin Dosing Weight: 70.9 kg  Vital Signs: Temp: 99.6 F (37.6 C) (01/31 1819) Temp Source: Axillary (01/31 1819) BP: 145/75 (01/31 1819) Pulse Rate: 78 (01/31 1819)  Labs: Recent Labs    09/01/18 1216 09/01/18 1516 09/01/18 1856 09/01/18 1904 09/02/18 0219 09/02/18 1051 09/02/18 1225 09/02/18 1822  HGB 10.4*  --  10.9*  --  10.3*  --   --   --   HCT 33.5*  --  34.1*  --  32.8*  --   --   --   PLT 139*  --  141*  --  123*  --   --   --   APTT  --   --   --   --  65*  --   --   --   LABPROT  --   --   --   --  15.6*  --   --   --   INR  --   --   --   --  1.26  --   --   --   HEPARINUNFRC  --   --   --   --  0.18* <0.10*  --  0.19*  CREATININE 9.99*  --   --  10.48* 11.67*  --  5.98*  --   TROPONINI  --  1.91* 2.45*  --  2.22*  --   --   --     Medical History: Past Medical History:  Diagnosis Date  . Anemia of chronic kidney failure 03/11/2017  . Chest pain 09/01/2018  . Chronic kidney disease 03/11/2017  . Diabetes mellitus without complication ([place]) 0354  . Edema 01/14/2017  . Headache   . Hypercholesterolemia   . Hypertension 03/11/2017  . Iron deficiency anemia 06/18/2016  . RSV (respiratory syncytial virus infection) 08/2017   Assessment: 48 yr old male admitted with chest tightness and found to have NSTEMI.  Troponin peaked at 2.45.  Started on IV heparin per cards recommendations with plans for cardiac cath once infectious issues are resolved.  Heparin level earlier today was undetectable.  Spoke with RN, who confirmed heparin infusion had been off ~1 hour around the time of lab draw.  Pt had coughed up some blood tinged sputum and heparin was held for this time.  Heparin was restarted ~1100 am.  Heparin level this evening  still below goal (0.19).  No bleeding noted.  Goal of Therapy:  Heparin level 0.3-0.7 units/ml Monitor platelets by anticoagulation protocol: Yes   Plan:  Increase heparin to 1250 units/hr Recheck heparin level in 8 hrs Follow-up with pt/RN if blood tinged sputum continues. Daily heparin level and CBC.  Marguerite Olea, [facility] Clinical Pharmacist Phone [phone]  09/02/2018 7:47 PM

## 2018-09-02 NOTE — Progress Notes (Addendum)
Progress Note  Patient Name: Blake Santana Date of Encounter: 09/02/2018  Primary Cardiologist: [name], MD   Subjective   Has a little bit of CP.  HR mildly elevated at 103bpm.  Currently on BiPAP.  Inpatient Medications    Scheduled Meds: . amLODipine  5 mg Oral Q breakfast  . aspirin EC  81 mg Oral Daily  . azithromycin  500 mg Oral Q24H  . doxercalciferol  3 mcg Intravenous Q T,Th,Sa-HD  . gabapentin  300 mg Oral QHS  . insulin aspart  0-9 Units Subcutaneous TID WC  . metoprolol tartrate  12.5 mg Oral BID WC  . multivitamin  1 tablet Oral QHS  . rosuvastatin  20 mg Oral q1800  . sodium chloride flush  3 mL Intravenous Q12H  . sucroferric oxyhydroxide  1,000 mg Oral TID WC   Continuous Infusions: . sodium chloride    . sodium chloride    . sodium chloride    . cefTRIAXone (ROCEPHIN)  IV Stopped (09/01/18 2158)  . heparin 1,100 Units/hr (09/02/18 0309)   PRN Meds: sodium chloride, sodium chloride, sodium chloride, alteplase, heparin, lidocaine (PF), lidocaine-prilocaine, nitroGLYCERIN, ondansetron (ZOFRAN) IV, pentafluoroprop-tetrafluoroeth, sodium chloride flush   Vital Signs    Vitals:   09/02/18 0630 09/02/18 0700 09/02/18 0730 09/02/18 0758  BP: (!) 191/119 (!) 175/89 (!) 153/131   Pulse: (!) 102 (!) 113 (!) 111 (!) 113  Resp: (!) 25 (!) 25 (!) 25 (!) 25  Temp:      TempSrc:      SpO2:      Weight:      Height:        Intake/Output Summary (Last 24 hours) at 09/02/2018 0821 Last data filed at 09/02/2018 0758 Gross per 24 hour  Intake 622.77 ml  Output 3000 ml  Net -2377.23 ml   Last 3 Weights 09/02/2018 09/01/2018 09/01/2018  Weight (lbs) 156 lb 4.9 oz 158 lb 8.2 oz 145 lb 8.1 oz  Weight (kg) 70.9 kg 71.9 kg 66 kg      Telemetry    Sinus tachycardia - Personally Reviewed  ECG    Sinus tachycardia with left atrial enlargement- Personally Reviewed  Physical Exam   GEN: No acute distress.   Neck: No JVD Cardiac: Tachycardic regular  rate, no murmurs, rubs, or gallops.  Respiratory:  Scattered wheezes GI: Soft, nontender, non-distended  MS: No edema; No deformity. Neuro:  Nonfocal  Psych: Normal affect   Labs    Chemistry Recent Labs  Lab 08/26/18 0951 09/01/18 1216 09/01/18 1904 09/02/18 0219  NA 144 136 135 138  K 6.1* 6.1* 5.4* 6.9*  CL 104 99 94* 101  CO2 _0 GLUCOSE 131* 184* 188* 284*  BUN 28* 66* 70* 85*  CREATININE 7.03* 9.99* 10.48* 11.67*  CALCIUM 9.4 9.1 9.8 9.2  PROT 7.4 6.8  --   --   ALBUMIN 4.1 3.3* 3.2* 2.8*  AST 10 16  --   --   ALT 12 14  --   --   ALKPHOS 83 63  --   --   BILITOT 0.3 0.7  --   --   GFRNONAA 8* 5* 5* 5*  GFRAA 10* 6* 6* 5*  ANIONGAP  --  _1 Hematology Recent Labs  Lab 09/01/18 1216 09/01/18 1856 09/02/18 0219  WBC 11.8* 12.5* 14.3*  RBC 3.47* 3.54* 3.42*  HGB 10.4* 10.9* 10.3*  HCT 33.5* 34.1* 32.8*  MCV 96.5 96.3 95.9  MCH 30.0 30.8 30.1  MCHC 31.0 32.0 31.4  RDW 14.4 14.6 14.6  PLT 139* 141* 123*    Cardiac Enzymes Recent Labs  Lab 09/01/18 1516 09/01/18 1856 09/02/18 0219  TROPONINI 1.91* 2.45* 2.22*    Recent Labs  Lab 09/01/18 1226  TROPIPOC 0.65*     BNP Recent Labs  Lab 09/01/18 1856  BNP 4,237.8*     DDimer No results for input(s): DDIMER in the last 168 hours.   Radiology    Dg Chest Portable 1 View  Result Date: 09/01/2018 CLINICAL DATA:  Shortness of breath, chest pain and fever. EXAM: PORTABLE CHEST 1 VIEW COMPARISON:  PA and lateral chest 03/20/2018. Single-view of the chest 08/09/2017. FINDINGS: There is left worse than right basilar airspace disease. Cardiomegaly and vascular congestion noted. No pneumothorax or pleural effusion. No acute or focal bony abnormality. IMPRESSION: Left worse than right basilar airspace disease worrisome for pneumonia. Cardiomegaly and pulmonary vascular congestion. Electronically Signed   By: Inge Rise M.D.   On: 09/01/2018 13:27    Cardiac Studies    none  Patient Profile     48 y.o. male with a hx of ESRD with AV graft, DM, HLD, HTN- not always controlled though at dialysis it runs low, and iron def. anemia who is being seen today for the evaluation of chest pain and syncope  Assessment & Plan    1.Chest pain/NSTEMI - pain described as a pressure sensation over the left chest and associated with sudden onset of weakness but no syncope. -Had low normal LV function by echo 04/2017 -Troponin initially 0.65 and peaked at 2.45 and is now trending downward to 2.22 -Troponin could be elevated due to acute respiratory failure in the setting of pneumonia and end-stage renal disease but given the trend it is worrisome for ACS -Still has a small bit of chest pain but difficult to tell whether it is cardiac in origin or related to his underlying pneumonia -2D echo pending this morning to assess for new wall motion normalities -He will  need cardiac cath given his cardiac risk factors including poorly controlled diabetes mellitus  elevated hemoglobin A1c, hypertension and hyperlipidemia as well as enzyme trend that is consistent with ACS -Unfortunately he currently is febrile due to acute respiratory failure with pneumonia so will need to wait until he has been afebrile for 48 hours prior to planning cardiac cath which will likely occur next Monday -Continue aspirin 81 mg daily, IV heparin drip and Crestor 20 mg daily -Inrease Lopressor to 25 mg p.o. twice daily for better blood pressure control as well as for MI -Add IV NTG drip  2.  Hyperlipidemia -LDL was 65 on admission. -Continue Crestor 20 mg daily  3.  Diabetes Mellitus -HbA1C 7.4 -Per TRH  4.  Pneumonia -He remains febrile 102.3 this morning  -noted to have infiltrate consistent with pneumonia on chest x-ray currently on BiPAP -O2 sats 99% on 80% FiO2 BiPAP -Antibiotics per TRH  5.  ESRD on HD -HD per nephrology -BNP elevated at 4237 yesterday -Had hemodialysis during the  night. -He has not had dialysis since Tuesday -Volume management per nephrology  6.  Hyperkalemia -He has been gIven calcium, insulin and D10 in ER -Potassium 6.9 at 2 AM this morning but then got dialysis -He met this morning pending -Per nephrology and TRH  7.  Hypertension -BP mains elevated this morning -Continue amlodipine 5 mg daily. -Recent Lopressor to 25 mg twice  daily and titrate as needed for blood pressure control.   8.  PreSyncope -Unclear whether he truly passed out -He became very weak and fell to the ground -Likely related to underlying infectious process with sepsis from pneumonia and respiratory failure but of concern was the fact that he had chest pain prior to becoming profoundly weak and falling to the ground which makes ACS more worrisome  I have spent a total of 35 minutes with patient reviewing hostpial notes , telemetry, EKGs, labs and examining patient as well as establishing an assessment and plan that was discussed with the patient.  > 50% of time was spent in direct patient care.       For questions or updates, please contact [place] Please consult [email or web address] for contact info under        Signed, Ledora Bottcher, PA  09/02/2018, 8:21 AM

## 2018-09-02 NOTE — Progress Notes (Signed)
Initial Nutrition Assessment  DOCUMENTATION CODES:   Non-severe (moderate) malnutrition in context of chronic illness  INTERVENTION:    Nepro Shake po BID, each supplement provides 425 kcal and 19 grams protein  Continue Rena-vit daily  Continue phosphorus binders  Spanish renal diet handout provided  NUTRITION DIAGNOSIS:   Moderate Malnutrition related to chronic illness(ESRD) as evidenced by mild fat depletion, mild muscle depletion.  GOAL:   Patient will meet greater than or equal to 90% of their needs  MONITOR:   PO intake, Supplement acceptance, Labs, I & O's  REASON FOR ASSESSMENT:   Consult Assessment of nutrition requirement/status  ASSESSMENT:   48 yo male with PMH of DM, anemia, ESRD on HD, HTN, HLD who was admitted with PNA and elevated troponins.  Spoke with patient and his wife with [employer], hospital [employer] interpreter. Patient has gained weight recently due to fluid overload. Unsure of usual weight or EDW. Appetite and intake was good PTA.   Labs reviewed. BUN 25 (H), creatinine 5.98 (H), Corrected calcium 9.76, potassium 4.7 (WNL), Phosphorus 3.6 (WNL) Medications reviewed and include Novolog, Rena-vit, Velphoro.  NUTRITION - FOCUSED PHYSICAL EXAM:    Most Recent Value  Orbital Region  Mild depletion  Upper Arm Region  Mild depletion  Thoracic and Lumbar Region  No depletion  Buccal Region  No depletion  Temple Region  No depletion  Clavicle Bone Region  Mild depletion  Clavicle and Acromion Bone Region  Mild depletion  Scapular Bone Region  No depletion  Dorsal Hand  Mild depletion  Patellar Region  Unable to assess  Anterior Thigh Region  Unable to assess  Posterior Calf Region  Mild depletion  Edema (RD Assessment)  None  Hair  Reviewed  Eyes  Reviewed  Mouth  Reviewed  Skin  Reviewed  Nails  Reviewed       Diet Order:   Diet Order            Diet renal with fluid restriction Fluid restriction: 1200 mL Fluid; Room service  appropriate? Yes; Fluid consistency: Thin  Diet effective now              EDUCATION NEEDS:   Education needs have been addressed  Skin:  Skin Assessment: Reviewed RN Assessment  Last BM:  1/29  Height:   Ht Readings from Last 1 Encounters:  09/01/18 5\' 2"  (1.575 m)    Weight:   Wt Readings from Last 1 Encounters:  09/02/18 70.9 kg    Ideal Body Weight:  53.6 kg  BMI:  Body mass index is 28.59 kg/m.  Estimated Nutritional Needs:   Kcal:  1800-2100  Protein:  85-100 gm  Fluid:  UOP +1 L    Molli Barrows, RD, LDN, [place] Pager [phone] After Hours Pager [phone]

## 2018-09-02 NOTE — Care Management Note (Signed)
Case Management Note  Patient Details  Name: Blake Santana MRN: [id number] Date of Birth: 02-09-1971  Subjective/Objective:  Pt presented for Acute Respiratory Failure with fever and CP on admission. Patient initiated on BIPAP.  Interpreter at the bedside. Patient currently not working- from home with wife. Pt has PCP Dr. Mack Hook at the [facility]. Patient currently using [facility] for medications. However, patient can utilize the [employer] for medications ranging in cost from $4.00-$10.00.                Action/Plan: CM did call to schedule a hospital follow up appointment @ [facility]  for the patient. Appointment will be placed on AVS. CM will continue to monitor for additional transition of care needs.   Expected Discharge Date:                  Expected Discharge Plan:  Home/Self Care  In-House Referral:  Development worker, community, Interpreting Services.   Discharge planning Services  CM Consult, Follow-up appt scheduled, Medication Assistance  Post Acute Care Choice:    Choice offered to:     DME Arranged:    DME Agency:     HH Arranged:  NA HH Agency:  NA  Status of Service:  In process, will continue to follow  If discussed at Long Length of Stay Meetings, dates discussed:    Additional Comments:  Bethena Roys, RN 09/02/2018, 2:33 PM

## 2018-09-02 NOTE — Progress Notes (Signed)
ANTICOAGULATION CONSULT NOTE - Follow-Up   Pharmacy Consult for Heparin Indication: chest pain/ACS/NSTEMI  No Known Allergies  Patient Measurements: Height: 5\' 2"  (157.5 cm) Weight: 156 lb 4.9 oz (70.9 kg)(not standing d/t pt now on bipap) IBW/kg (Calculated) : 54.6 Heparin Dosing Weight: 70.9 kg  Vital Signs: Temp: 102.3 F (39.1 C) (01/31 0931) Temp Source: Axillary (01/31 0931) BP: 176/75 (01/31 0931) Pulse Rate: 99 (01/31 0931)  Labs: Recent Labs    09/01/18 1216 09/01/18 1516 09/01/18 1856 09/01/18 1904 09/02/18 0219 09/02/18 1051 09/02/18 1225  HGB 10.4*  --  10.9*  --  10.3*  --   --   HCT 33.5*  --  34.1*  --  32.8*  --   --   PLT 139*  --  141*  --  123*  --   --   APTT  --   --   --   --  65*  --   --   LABPROT  --   --   --   --  15.6*  --   --   INR  --   --   --   --  1.26  --   --   HEPARINUNFRC  --   --   --   --  0.18* <0.10*  --   CREATININE 9.99*  --   --  10.48* 11.67*  --  5.98*  TROPONINI  --  1.91* 2.45*  --  2.22*  --   --     Medical History: Past Medical History:  Diagnosis Date  . Anemia of chronic kidney failure 03/11/2017  . Chest pain 09/01/2018  . Chronic kidney disease 03/11/2017  . Diabetes mellitus without complication ([place]) 3810  . Edema 01/14/2017  . Headache   . Hypercholesterolemia   . Hypertension 03/11/2017  . Iron deficiency anemia 06/18/2016  . RSV (respiratory syncytial virus infection) 08/2017   Assessment: 48 yr old male admitted with chest tightness and found to have NSTEMI.  Troponin peaked at 2.45.  Started on IV heparin per cards recommendations with plans for cardiac cath once infectious issues are resolved.  Heparin level earlier today was undetectable.  Spoke with RN, who confirmed heparin infusion had been off ~1 hour around the time of lab draw.  Pt had coughed up some blood tinged sputum and heparin was held for this time.  Heparin was restarted ~1100 am.  Goal of Therapy:  Heparin level 0.3-0.7  units/ml Monitor platelets by anticoagulation protocol: Yes   Plan:  Continue heparin to 1100 units/hr Re-check heparin level in 6 hours Follow-up with pt/RN if blood tinged sputum continues. Daily heparin level and CBC.  [employer], Pharm.D., BCPS Clinical Pharmacist  **Pharmacist phone directory can now be found on [email or web address] ([email or web address]).  Listed under [place].  09/02/2018 3:32 PM

## 2018-09-02 NOTE — Progress Notes (Signed)
[facility]ent for Heparin Indication: chest pain/ACS/NSTEMI  No Known Allergies  Patient Measurements: Height: 5\' 2"  (157.5 cm) Weight: 158 lb 8.2 oz (71.9 kg)(pt stated was too weak) IBW/kg (Calculated) : 54.6 Heparin Dosing Weight: 71.9 kg  Vital Signs: Temp: 103.3 F (39.6 C) (01/30 2342) Temp Source: Rectal (01/30 2342) BP: 154/80 (01/30 2332) Pulse Rate: 87 (01/31 0120)  Labs: Recent Labs    09/01/18 1216 09/01/18 1516 09/01/18 1856 09/01/18 1904 09/02/18 0219  HGB 10.4*  --  10.9*  --  10.3*  HCT 33.5*  --  34.1*  --  32.8*  PLT 139*  --  141*  --  123*  APTT  --   --   --   --  65*  LABPROT  --   --   --   --  15.6*  INR  --   --   --   --  1.26  HEPARINUNFRC  --   --   --   --  0.18*  CREATININE 9.99*  --   --  10.48* 11.67*  TROPONINI  --  1.91* 2.45*  --   --     Medical History: Past Medical History:  Diagnosis Date  . Anemia of chronic kidney failure 03/11/2017  . Chest pain 09/01/2018  . Chronic kidney disease 03/11/2017  . Diabetes mellitus without complication ([place]) 2992  . Edema 01/14/2017  . Headache   . Hypercholesterolemia   . Hypertension 03/11/2017  . Iron deficiency anemia 06/18/2016  . RSV (respiratory syncytial virus infection) 08/2017   Assessment:  48 yr old male admitted with chest tightness and concern for NSTEMI.  Troponin up 1.19.  On IV heparin.  1/31 AM update: initial heparin level is low, no issues per RN.   Goal of Therapy:  Heparin level 0.3-0.7 units/ml Monitor platelets by anticoagulation protocol: Yes   Plan:  Inc heparin to 1100 units/hr Re-check heparin level in 6-8 hours  Narda Bonds, PharmD, BCPS Clinical Pharmacist Phone: [phone]

## 2018-09-03 DIAGNOSIS — R9431 Abnormal electrocardiogram [ECG] [EKG]: Secondary | ICD-10-CM

## 2018-09-03 LAB — RENAL FUNCTION PANEL
Albumin: 2.5 g/dL — ABNORMAL LOW (ref 3.5–5.0)
Anion gap: 13 (ref 5–15)
BUN: 65 mg/dL — ABNORMAL HIGH (ref 6–20)
CO2: 26 mmol/L (ref 22–32)
Calcium: 9.2 mg/dL (ref 8.9–10.3)
Chloride: 97 mmol/L — ABNORMAL LOW (ref 98–111)
Creatinine, Ser: 9.19 mg/dL — ABNORMAL HIGH (ref 0.61–1.24)
GFR calc Af Amer: 7 mL/min — ABNORMAL LOW (ref >60)
GFR calc non Af Amer: 6 mL/min — ABNORMAL LOW (ref >60)
GLUCOSE: 246 mg/dL — AB (ref 70–99)
Phosphorus: 3 mg/dL (ref 2.5–4.6)
Potassium: 5.2 mmol/L — ABNORMAL HIGH (ref 3.5–5.1)
Sodium: 136 mmol/L (ref 135–145)

## 2018-09-03 LAB — CBC
HCT: 32 % — ABNORMAL LOW (ref 39.0–52.0)
Hemoglobin: 9.8 g/dL — ABNORMAL LOW (ref 13.0–17.0)
MCH: 29.6 pg (ref 26.0–34.0)
MCHC: 30.6 g/dL (ref 30.0–36.0)
MCV: 96.7 fL (ref 80.0–100.0)
Platelets: 106 10*3/uL — ABNORMAL LOW (ref 150–400)
RBC: 3.31 MIL/uL — ABNORMAL LOW (ref 4.22–5.81)
RDW: 14.6 % (ref 11.5–15.5)
WBC: 10.5 10*3/uL (ref 4.0–10.5)
nRBC: 0 % (ref 0.0–0.2)

## 2018-09-03 LAB — GLUCOSE, CAPILLARY
Glucose-Capillary: 144 mg/dL — ABNORMAL HIGH (ref 70–99)
Glucose-Capillary: 226 mg/dL — ABNORMAL HIGH (ref 70–99)
Glucose-Capillary: 123 mg/dL — ABNORMAL HIGH (ref 70–99)

## 2018-09-03 LAB — PROCALCITONIN: Procalcitonin: 55.08 ng/mL

## 2018-09-03 LAB — HEPARIN LEVEL (UNFRACTIONATED)
Heparin Unfractionated: 0.19 IU/mL — ABNORMAL LOW (ref 0.30–0.70)
Heparin Unfractionated: 0.31 IU/mL (ref 0.30–0.70)

## 2018-09-03 LAB — TROPONIN I: Troponin I: 0.98 ng/mL (ref <0.03)

## 2018-09-03 MED ORDER — CLINDAMYCIN PHOSPHATE 900 MG/50ML IV SOLN
INTRAVENOUS | Status: AC
  Filled 2018-09-03: qty 50

## 2018-09-03 MED ORDER — LIDOCAINE-EPINEPHRINE 1 %-1:100000 IJ SOLN
INTRAMUSCULAR | Status: AC
  Filled 2018-09-03: qty 1

## 2018-09-03 MED ORDER — DARBEPOETIN ALFA 60 MCG/0.3ML IJ SOSY
60.0000 ug | PREFILLED_SYRINGE | INTRAMUSCULAR | Status: DC
  Administered 2018-09-03: 60 ug via INTRAVENOUS
  Filled 2018-09-03: qty 0.3

## 2018-09-03 MED ORDER — DARBEPOETIN ALFA 60 MCG/0.3ML IJ SOSY
PREFILLED_SYRINGE | INTRAMUSCULAR | Status: AC
  Filled 2018-09-03: qty 0.3

## 2018-09-03 MED ORDER — DOXERCALCIFEROL 4 MCG/2ML IV SOLN
INTRAVENOUS | Status: AC
  Filled 2018-09-03: qty 2

## 2018-09-03 NOTE — Progress Notes (Addendum)
FMTS Attending Daily Note:  Blake Netters MD Personal pager:  [phone] FPTS Service Pager:  [phone]  I have seen and examined this patient and have reviewed their chart. I have discussed this patient with the resident. I agree with the resident's findings, assessment and care plan.  Blake Rio, MD  ----------------------------------------------------------   Family Medicine Teaching Service Daily Progress Note Intern Pager: [phone]  Patient name: Blake Santana Medical record number: [id number] Date of birth: [birth year] Age: 48 y.o. Gender: male  Primary Care Provider: Mack Hook, MD Consultants: Cardiology, nephrology, dietitian, pharmacy (heparin, vancomycin, cefepime) Code Status: Full Code   Pt Overview and Major Events to Date:  Admitted: 09/01/2018 for CC: Chest Pain  Hospital Day: 3   Assessment and Plan: Blake Santana is a 48 y.o. male admitted for atypical chest pain, weakness, orthopnea, fever found to have CAP.  Past medical history significant for ESRD TTS, type 2 diabetes, HLD, HTN and iron deficiency anemia   #NSTEMI Patient's chest pain this morning is 0 to mild.  His chest pain is currently well controlled on the nitro drip and has not required any PRN.  Patient has not been afebrile x24 hours.  We will plan for Cath Lab on Monday pending patient's continued improvement from infectious standpoint.  Cardiology following, appreciate recommendations  Continue heparin drip  Plans for left heart cath on Monday  PRN nitro  #ARF w/ Hypoxia  Down to 5L overnight [place].  Overall improving with no fevers for 24 hours.  Lungs without crackles on exam this morning.  Patient appears comfortable with nasal cannula at 5 L. He is down 2.6L charted on IOs   Wean oxygen as tolerated  Continuous cardiac monitoring  Continue plans for CAP and exacerbation as below  #CAP Prolactin returned at 55.08 this morning up from 3.72, though  clinically, patient appears to be improving. WBC 10.5 and downtrending this morning.   Continuing vancomycin and cefepime dosed per pharmacy  Blood culture  Follow 2/2 a.m. prolactin  Reach out to CCM for prolactin interpretation  #Acute HFrEF Exacerbation  Echo on 09/02/2018 with low to normal ejection fraction of 50 to 55%.  Patient without any crackles or lower extremity edema on exam after HD this morning.  HD  #ESRD, HD TTS HD today  Nephrology on board, appreciate recommendations  AM RFP  #DM 2 CBGs in 140s to 200s.  Sensitive sliding scale with meals  Holding home glipizide  #HLD  Continue home rosuvastatin 20 mg  #HTN  Continue home amlodipine 5 mg, metoprolol 12.5 mg twice daily  #Anemia   ERA per nephrology  Hyperkalemia  HD orders per nephrology  AM  RFP  FEN: Replete PRN  Diet Order            Diet renal with fluid restriction Fluid restriction: 1200 mL Fluid; Room service appropriate? Yes; Fluid consistency: Thin  Diet effective now             DVT ppx: heparin drip   Future labs: RFP Disposition: Plans for LHC on Monday.   Medications: Scheduled Meds: . amLODipine  5 mg Oral Q breakfast  . aspirin EC  81 mg Oral Daily  . Chlorhexidine Gluconate Cloth  6 each Topical Q0600  . darbepoetin (ARANESP) injection - DIALYSIS  60 mcg Intravenous Q Sat-HD  . doxercalciferol  3 mcg Intravenous Q T,Th,Sa-HD  . feeding supplement (NEPRO CARB STEADY)  237 mL Oral BID BM  . gabapentin  300 mg  Oral QHS  . insulin aspart  0-9 Units Subcutaneous TID WC  . metoprolol tartrate  12.5 mg Oral Once  . metoprolol tartrate  25 mg Oral BID WC  . multivitamin  1 tablet Oral QHS  . rosuvastatin  20 mg Oral q1800  . sodium chloride flush  3 mL Intravenous Q12H  . sucroferric oxyhydroxide  1,000 mg Oral TID WC   Continuous Infusions: . sodium chloride    . sodium chloride    . sodium chloride    . ceFEPime (MAXIPIME) IV    . heparin 1,400 Units/hr  (09/03/18 2683)  . nitroGLYCERIN 7.5 mcg/min (09/03/18 0958)  . vancomycin     PRN Meds: sodium chloride, sodium chloride, sodium chloride, alteplase, heparin, lidocaine (PF), lidocaine-prilocaine, nitroGLYCERIN, ondansetron (ZOFRAN) IV, pentafluoroprop-tetrafluoroeth, sodium chloride flush  Subjective  Patient reports doing well overnight.  He reports only mild chest pain but very minimal to none at most times.  He is not having any difficulty breathing.  He reports some minor diaphoresis but otherwise does denies any chills, nausea, vomiting, cough.  Objective  Vital Signs Temp:  [98.5 F (36.9 C)-100.1 F (37.8 C)] 98.6 F (37 C) (02/01 0526) Pulse Rate:  [78-93] 87 (02/01 0946) Resp:  [19-29] 21 (02/01 0946) BP: (129-158)/(66-80) 158/80 (02/01 0946) SpO2:  [93 %-98 %] 95 % (02/01 0946) Weight:  [66.4 kg] 66.4 kg (02/01 0526)  Intake/Output      01/31 0701 - 02/01 0700 02/01 0701 - 02/02 0700   P.O. 237    I.V. (mL/kg) 248.4 (3.7)    NG/GT 0    IV Piggyback 194.5    Total Intake(mL/kg) 679.9 (10.2)    Urine (mL/kg/hr) 300 (0.2)    Other 3000    Total Output 3300    Net -2620.1           Physical Exam  Gen: NAD, alert, non-toxic, well-appearing, sitting comfortably.  Skin: Warm and mostly dry with very mild diaphoresis. HEENT: NCAT.  MMM.  CV: RRR.  Normal S1-S2. No BLEE. Resp: CTAB. No increased WOB no crackles appreciated.  Decreased breath sounds at bases bilaterally. Abd: NTND on palpation to all 4 quadrants.  Pos bowel sounds Extremities: moves extremities spontaneously. Warm and well perfused.   Laboratory: Recent Labs  Lab 09/01/18 1856 09/02/18 0219 09/03/18 0510  WBC 12.5* 14.3* 10.5  HGB 10.9* 10.3* 9.8*  HCT 34.1* 32.8* 32.0*  PLT 141* 123* 106*   Recent Labs  Lab 09/01/18 1216 09/01/18 1904 09/02/18 0219 09/02/18 1225  NA 136 135 138 143  K 6.1* 5.4* 6.9* 4.7  CL 99 94* 101 101  CO2 26 26 25 28   BUN 66* 70* 85* 25*  CREATININE 9.99*  10.48* 11.67* 5.98*  CALCIUM 9.1 9.8 9.2 8.8*  PROT 6.8  --   --   --   BILITOT 0.7  --   --   --   ALKPHOS 63  --   --   --   ALT 14  --   --   --   AST 16  --   --   --   GLUCOSE 184* 188* 284* 213*    Imaging/Diagnostic Tests:   Recent Results (from the past 240 hour(s))  Blood Culture (routine x 2)     Status: None (Preliminary result)   Collection Time: 09/01/18  1:45 PM  Result Value Ref Range Status   Specimen Description BLOOD RIGHT FOREARM  Final   Special Requests   Final  BOTTLES DRAWN AEROBIC AND ANAEROBIC Blood Culture results may not be optimal due to an excessive volume of blood received in culture bottles Performed at [facility], [address] [place], [place] [address]    Culture NO GROWTH 2 DAYS  Final   Report Status PENDING  Incomplete  Blood Culture (routine x 2)     Status: None (Preliminary result)   Collection Time: 09/01/18  1:53 PM  Result Value Ref Range Status   Specimen Description BLOOD RIGHT ANTECUBITAL  Final   Special Requests   Final    BOTTLES DRAWN AEROBIC AND ANAEROBIC Blood Culture results may not be optimal due to an excessive volume of blood received in culture bottles Performed at [facility] [address], [place], [place] [address]    Culture NO GROWTH 2 DAYS  Final   Report Status PENDING  Incomplete        Wilber Oliphant, MD 09/03/2018, 11:10 AM PGY-1, [place] Intern pager: [phone], text pages welcome

## 2018-09-03 NOTE — Discharge Summary (Signed)
[facility] Discharge Summary  Patient name: Blake Santana Medical record number: [id number] Date of birth: November 03, 1970 Age: 48 y.o. Gender: male Date of Admission: 09/01/2018  Date of Discharge: 09/07/2018 Admitting Physician: Leeanne Rio, MD  Primary Care Provider: Mack Hook, MD Consultants: Cardiology, Nephrology, CCM  Indication for Hospitalization: Chest Pain and Fever  Discharge Diagnoses/Problem List:  Atypical Chest Pain NSTEMI ESRD Acute respiratory failure with hypoxia Community acquired pneumonia  Hypertension Hyperlipidemia  Disposition: Home  Discharge Condition: Improved  Discharge Exam:  General: Middle aged 76 male, well nourished, well developed, in no acute distress with non-toxic appearance, sitting up comfortably in bed  HEENT: normocephalic, atraumatic, moist mucous membranes CV: regular rate and rhythm without murmurs, rubs, or gallops, no lower extremity edema, 2+ radial and pedal pulses bilaterally Lungs: fine crackles in LLL much improved from day prior, no further crackles appreciated in RLL, othewise clear to auscultation with normal work of breathing Abdomen: soft, non-tender, non-distended, normoactive bowel sounds Skin: warm, dry, no rashes or lesions Extremities: warm and well perfused, normal tone Neuro: Alert and oriented, speech normal  Brief Hospital Course:  Blake Santana is a 48 y.o. male with past medical history significant for ESRD on HD TTS, Non-insulin dependent T2DM, HTN, HLD, and iron deficiency anemia who presented with atypical chest pain and weakness found to have NSTEMI and CAP. Hospital course outlined below.  Atypical Chest Pain 2/2 demand Ischemia: Patient presented with non-radiating, non-pleuritic and nonexertional substernal chest tightness relieved with SL NTG and ASA. Heart score of 5 with no known CAD with risk factors including HTN, HLD, and T2DM. Initial work  up was significant for EKG with lateral T-wave depressions and elevated troponin to max 2.45. Cardiology was consulted and patient was placed on heparin drip. It was expected concurrent CAP and hypervolemia (BNP 4237.8) was also contributing to chest pain. Echo significant for low normal EF 50-55% with no wall motion abnormalities. Patient received left heart catheterization on 2/3 once afebrile for >48hours. Only significant for 10% stenosis of mid LAD and mildly elevated LV end diastolic pressure. No cardiac intervention was performed. Felt chest pain was likely from demand ischemia in setting of ESRD with falsely elevated troponins.  At discharge patient was continued on ASA 14m, Metorpolol 229mBID, Crestor 2068mand Norvasc 5mg88me was chest pain free for >48 hours prior to discharge.  Acute hypoxic Respiratory Failure 2/2 CAP: On presenation to ED, patient was febrile to 101.6 with a leukocytosis of 11.8 and ANC of 9.2. CXR significant for left > right basilar airspace disease concerning for pneumonia. He became hypoxic in the ED to 88% and was placed on 10L O2 via NRB and ultimately required BiPAP to maintain O2 sats. He was initially started on Azithromycin and Ceftriaxone for CAP, but he continued to have elevated temperatures (tmax 103.3) and thus was transitioned to broad spectrum antibiotics Vancomycin and Cefepime on 1/31. He received HD while inpatient which significantly improved his work of breathing. As he improved he was weaned off of BiPAP to nasal canula and eventually to room air. His leukocytosis continued to down trend and he remained afebrile for >48 hours prior to discharge. Blood cultures remained negative to date. He was transitioned to PO Levaquin in which he completed a 7 day course on 2/5. At time of discharge patient was improved and breathing comfortably on room air. Return precautions discussed and close follow up arranged.  Hyperkalemia: Patient was noted to have elevated  potassium to 6.1 in the ED, which improved temporarily with D5/insulin and calcium. He continued to experience elevated levels to a max of 6.9 early in his admission.  It was felt to be secondary to volume overload from ESRD. He received HD and his hyperkalemia self-resolved. His potassium was stable at 4.5 for >48 hours prior to discharge.   Syncopal Episode: Patient experienced a brief syncopal episode associated with non-bloody, non-bilious emesis during an HD session. Vitals were stable at that time and work up including EKG was unremarkable. It was felt to be vasovagal in nature secondary to HD. No recurrence of syncope or emesis occurred for the remainder of his admission.   Issues for Follow Up:  1. Gabapentin decreased to 361m to be taken only on HD days given endorsement of chronic tremors  2. Prolonged QT of 492 on last EKG - consider repeat EKG and recommend using QT prolonging agents with care   Significant Procedures:  Echocardiogram Left Heart Catheterization  Significant Labs and Imaging:  Recent Labs  Lab 09/06/18 0433 09/06/18 1030 09/07/18 0425  WBC 8.4 9.2 10.3  HGB 10.5* 10.5* 9.9*  HCT 31.5* 32.6* 30.6*  PLT 163 153 174   Recent Labs  Lab 09/01/18 1216  09/02/18 0219 09/02/18 1225 09/03/18 1535 09/04/18 0446 09/05/18 0506 09/06/18 0433 09/07/18 0425  NA 136   < > 138 143 136 136 135 134* 134*  K 6.1*   < > 6.9* 4.7 5.2* 4.3 4.4 4.5 4.4  CL 99   < > 101 101 97* 96* 93* 94* 94*  CO2 26   < > '25 28 26 27 24 24 29  ' GLUCOSE 184*   < > 284* 213* 246* 201* 159* 191* 274*  BUN 66*   < > 85* 25* 65* 37* 71* 101* 60*  CREATININE 9.99*   < > 11.67* 5.98* 9.19* 6.38* 9.25* 11.56* 9.37*  CALCIUM 9.1   < > 9.2 8.8* 9.2 9.1 9.5 9.6 9.2  MG 2.5*  --   --   --   --   --   --   --   --   PHOS  --    < > 3.2 3.6 3.0 4.1 3.9  --   --   ALKPHOS 63  --   --   --   --   --   --   --   --   AST 16  --   --   --   --   --   --   --   --   ALT 14  --   --   --   --   --   --    --   --   ALBUMIN 3.3*   < > 2.8* 2.8* 2.5* 2.6* 2.6*  --   --    < > = values in this interval not displayed.   EKG: NSR with lateral T wave depression, more pronounced than last Year  EKG: NSR with nonspecific ST changes EKG 2/4: NSR with QTc 498 Troponin: 0.65 Flu: negative Magnesium: 2.5 (h) Lactic acid: 1.1>1.5 I-stat troponin: 0.65 Troponin: 1.91>2.45>2.22>0.69 TSH 1.174 BNP 4237.8 ABG: pH 7.438, pCO2 65.6, bicarb 28.1 procalcitonin 3.72>55.08>69.66 PT/INR: 15.6/1.26  Blood Culture    Component Value Date/Time   SDES BLOOD RIGHT ANTECUBITAL 09/01/2018 1353   SPECREQUEST  09/01/2018 1353    BOTTLES DRAWN AEROBIC AND ANAEROBIC Blood Culture results may not be optimal due to an excessive volume of blood  received in culture bottles   CULT  09/01/2018 1353    NO GROWTH 5 DAYS Performed at [facility] [address], [place], [place] [address]    REPTSTATUS 09/06/2018 FINAL 09/01/2018 1353   Echo 09/02/18: IMPRESSIONS  1. The left ventricle has low normal systolic function of 77-82%. The cavity size is normal. There is moderate left ventricular wall thickness. Echo evidence of Indeterminate diastolic filling patterns.  2. Normal left atrial size.  3. Normal right atrial size.  4. Trivial pericardial effusion, as described above.  5. The mitral valve Thickened. There is mild thickening. Regurgitation is mild by color flow Doppler.  6. Normal tricuspid valve.  7. Tricuspid regurgitation is mild.  8. The aortic valve tricuspid. There is mild thickening of the aortic valve.  9. No atrial level shunt detected by color flow Doppler.  Dg Chest 2 View  Result Date: 09/06/2018 CLINICAL DATA:  Follow-up pneumonia EXAM: CHEST - 2 VIEW COMPARISON:  09/01/2018 FINDINGS: Persistent patchy bilateral lower lobe pneumonia and volume loss. Similar appearance allowing for technical factors. Upper lobes remain largely clear. No visible effusion. IMPRESSION: Persistent bilateral  lower lobe pneumonia. Electronically Signed   By: Nelson Chimes M.D.   On: 09/06/2018 07:37   Dg Chest Portable 1 View  Result Date: 09/01/2018 CLINICAL DATA:  Shortness of breath, chest pain and fever. EXAM: PORTABLE CHEST 1 VIEW COMPARISON:  PA and lateral chest 03/20/2018. Single-view of the chest 08/09/2017. FINDINGS: There is left worse than right basilar airspace disease. Cardiomegaly and vascular congestion noted. No pneumothorax or pleural effusion. No acute or focal bony abnormality. IMPRESSION: Left worse than right basilar airspace disease worrisome for pneumonia. Cardiomegaly and pulmonary vascular congestion. Electronically Signed   By: Inge Rise M.D.   On: 09/01/2018 13:27   Left Heart Catheterization  09/05/18  Mid LAD lesion is 10% stenosed.  Ost 2nd Diag to 2nd Diag lesion is 10% stenosed.  LV end diastolic pressure is mildly elevated. LVEDP 18 mm Hg.  There is no aortic valve stenosis. No significant CAD.  Continue aggressive secondary prevention.    Results/Tests Pending at Time of Discharge: None  Discharge Medications:  Allergies as of 09/07/2018   No Known Allergies     Medication List    STOP taking these medications   calcium acetate 667 MG capsule Commonly known as:  PHOSLO   cetirizine 10 MG tablet Commonly known as:  ZYRTEC     TAKE these medications   AGAMATRIX PRESTO w/Device Kit Check sugars twice daily   amLODipine 5 MG tablet Commonly known as:  NORVASC 1 tab by mouth daily in the morning with breakfast What changed:    how much to take  how to take this  when to take this  additional instructions   aspirin 81 MG EC tablet Take 1 tablet (81 mg total) by mouth daily. Start taking on:  September 08, 2018   b complex-vitamin c-folic acid 0.8 MG Tabs tablet Take 1 tablet by mouth daily.   gabapentin 300 MG capsule Commonly known as:  NEURONTIN Take 1 capsule (300 mg total) by mouth Every Tuesday,Thursday,and Saturday with  dialysis. Start taking on:  September 08, 2018 What changed:    how much to take  how to take this  when to take this  additional instructions   glipiZIDE 5 MG tablet Commonly known as:  GLUCOTROL 1 tab by mouth twice daily with meals What changed:    how much to take  how to take this  when to take this  additional instructions   glucose blood test strip Commonly known as:  AGAMATRIX PRESTO TEST Check sugars twice daily   metoprolol tartrate 25 MG tablet Commonly known as:  LOPRESSOR Take 1 tablet (25 mg total) by mouth 2 (two) times daily with a meal. What changed:    how much to take  how to take this  when to take this  additional instructions   rosuvastatin 20 MG tablet Commonly known as:  CRESTOR Take 1 tablet (20 mg total) by mouth daily at 6 PM. What changed:    medication strength  how much to take  how to take this  when to take this  additional instructions   VELPHORO 500 MG chewable tablet Generic drug:  sucroferric oxyhydroxide Chew 500 mg by mouth 3 (three) times daily with meals.       Discharge Instructions: Please refer to Patient Instructions section of EMR for full details.  Patient was counseled important signs and symptoms that should prompt return to medical care, changes in medications, dietary instructions, activity restrictions, and follow up appointments.   Follow-Up Appointments: Follow-up Information    Mack Hook, MD. Schedule an appointment as soon as possible for a visit on 09/08/2018.   Specialty:  Internal Medicine Why:  Please call office to reschedule follow up appointment.  Contact information: [place] [address] [phone]        [place]. Go to.   Why:  this location for assistance with medications ranging in cost from $4.00-$10.00 Contact information: [place] [id number] [place], [place], DO 09/07/2018, 5:20 PM PGY-1, [place]

## 2018-09-03 NOTE — Progress Notes (Signed)
[facility] Progress Note   Subjective:  Seen in room, resting comfortably on nasal oxygen. Still with some chest tightness, not worse than yesterday.  Objective Vitals:   09/03/18 0025 09/03/18 0526 09/03/18 0800 09/03/18 0946  BP: 136/70 (!) 145/75  (!) 158/80  Pulse: 85 85 81 87  Resp: (!) 29 (!) 24 19 (!) 21  Temp: 99.5 F (37.5 C) 98.6 F (37 C)    TempSrc: Oral Oral    SpO2: 93% 94% 98% 95%  Weight:  66.4 kg    Height:       Physical Exam General: Well appearing man, NAD. Wearing nasal oxygen. Heart: RRR; no murmur Lungs: Clear in upper lobes, + bibasilar rales present Abdomen: soft, non-tender Extremities: No LE edema Dialysis Access: AVF + thrill  Additional Objective Labs: Basic Metabolic Panel: Recent Labs  Lab 09/01/18 1904 09/02/18 0219 09/02/18 1225  NA 135 138 143  K 5.4* 6.9* 4.7  CL 94* 101 101  CO2 26 25 28   GLUCOSE 188* 284* 213*  BUN 70* 85* 25*  CREATININE 10.48* 11.67* 5.98*  CALCIUM 9.8 9.2 8.8*  PHOS 3.5 3.2 3.6   Liver Function Tests: Recent Labs  Lab 09/01/18 1216 09/01/18 1904 09/02/18 0219 09/02/18 1225  AST 16  --   --   --   ALT 14  --   --   --   ALKPHOS 63  --   --   --   BILITOT 0.7  --   --   --   PROT 6.8  --   --   --   ALBUMIN 3.3* 3.2* 2.8* 2.8*   CBC: Recent Labs  Lab 09/01/18 1216 09/01/18 1856 09/02/18 0219 09/03/18 0510  WBC 11.8* 12.5* 14.3* 10.5  NEUTROABS 9.2*  --   --   --   HGB 10.4* 10.9* 10.3* 9.8*  HCT 33.5* 34.1* 32.8* 32.0*  MCV 96.5 96.3 95.9 96.7  PLT 139* 141* 123* 106*   Blood Culture    Component Value Date/Time   SDES BLOOD RIGHT ANTECUBITAL 09/01/2018 1353   SPECREQUEST  09/01/2018 1353    BOTTLES DRAWN AEROBIC AND ANAEROBIC Blood Culture results may not be optimal due to an excessive volume of blood received in culture bottles Performed at [facility] [address], [place], [place] [address]    CULT NO GROWTH 2 DAYS 09/01/2018 1353   REPTSTATUS  PENDING 09/01/2018 1353    Cardiac Enzymes: Recent Labs  Lab 09/01/18 1516 09/01/18 1856 09/02/18 0219  TROPONINI 1.91* 2.45* 2.22*   CBG: Recent Labs  Lab 09/02/18 0927 09/02/18 1056 09/02/18 1708 09/02/18 2059 09/03/18 0730  GLUCAP 180* 213* 270* 147* 144*   Studies/Results: Dg Chest Portable 1 View  Result Date: 09/01/2018 CLINICAL DATA:  Shortness of breath, chest pain and fever. EXAM: PORTABLE CHEST 1 VIEW COMPARISON:  PA and lateral chest 03/20/2018. Single-view of the chest 08/09/2017. FINDINGS: There is left worse than right basilar airspace disease. Cardiomegaly and vascular congestion noted. No pneumothorax or pleural effusion. No acute or focal bony abnormality. IMPRESSION: Left worse than right basilar airspace disease worrisome for pneumonia. Cardiomegaly and pulmonary vascular congestion. Electronically Signed   By: Inge Rise M.D.   On: 09/01/2018 13:27   Medications: . sodium chloride    . sodium chloride    . sodium chloride    . ceFEPime (MAXIPIME) IV    . heparin 1,400 Units/hr (09/03/18 0539)  . nitroGLYCERIN 5 mcg/min (09/03/18 0106)  .  vancomycin     . amLODipine  5 mg Oral Q breakfast  . aspirin EC  81 mg Oral Daily  . Chlorhexidine Gluconate Cloth  6 each Topical Q0600  . doxercalciferol  3 mcg Intravenous Q T,Th,Sa-HD  . feeding supplement (NEPRO CARB STEADY)  237 mL Oral BID BM  . gabapentin  300 mg Oral QHS  . insulin aspart  0-9 Units Subcutaneous TID WC  . metoprolol tartrate  12.5 mg Oral Once  . metoprolol tartrate  25 mg Oral BID WC  . multivitamin  1 tablet Oral QHS  . rosuvastatin  20 mg Oral q1800  . sodium chloride flush  3 mL Intravenous Q12H  . sucroferric oxyhydroxide  1,000 mg Oral TID WC    Dialysis Orders: TTS [facility] 3.75 hours EDW 64.5 2K 2 Ca heparin 2000 Hectorol 3 no ESA/Fe Recent labs: hgb 12.6 last Mircera 50 on 1/07 42% sat iPTH 279  Assessment/Plan: 1. Acute hypoxic respiratory failure with fever: HCAP +/-  volume excess. Flu negative. BCx pending. ^ Pro-calcitonin and BNP. Now on Vanc/Cefepime. 2. NSTEMI: + CP on admit, Trop peaked 2.45, now trending down. Cardiology following. On heparin drip. For LHC next week once afebrile. 3. Hyperkalemia: On admit,  resolved with HD. 4. ESRD: Usually TTS schedule. S/p HD overnight on admit d/t volume and ^ K. Now back to usual schedule -> HD today. 5. Hypertension/volume: Variable weights here, still volume up on exam. 6. Anemia of ESRD: Hgb 9.8 - resume Aranesp today. 7. Metabolic bone disease: Corr Ca/Phos fine. Continue binders (Velphoro) and VDRA. 8. Nutrition: Alb low. 9. DM: On insulin, per primary.  Veneta Penton, PA-C 09/03/2018, 9:51 AM  [employer] Pager: [phone]

## 2018-09-03 NOTE — Progress Notes (Addendum)
ANTICOAGULATION CONSULT NOTE   Pharmacy Consult for Heparin Indication: chest pain/ACS/NSTEMI  No Known Allergies  Patient Measurements: Height: 5\' 2"  (157.5 cm) Weight: 146 lb 4.8 oz (66.4 kg) IBW/kg (Calculated) : 54.6 Heparin Dosing Weight: 71.9 kg  Vital Signs: Temp: 98.6 F (37 C) (02/01 0526) Temp Source: Oral (02/01 0526) BP: 145/75 (02/01 0526) Pulse Rate: 85 (02/01 0526)  Labs: Recent Labs    09/01/18 1216 09/01/18 1516 09/01/18 1856 09/01/18 1904  09/02/18 0219 09/02/18 1051 09/02/18 1225 09/02/18 1822 09/03/18 0510  HGB 10.4*  --  10.9*  --   --  10.3*  --   --   --   --   HCT 33.5*  --  34.1*  --   --  32.8*  --   --   --   --   PLT 139*  --  141*  --   --  123*  --   --   --   --   APTT  --   --   --   --   --  65*  --   --   --   --   LABPROT  --   --   --   --   --  15.6*  --   --   --   --   INR  --   --   --   --   --  1.26  --   --   --   --   HEPARINUNFRC  --   --   --   --    < > 0.18* <0.10*  --  0.19* 0.19*  CREATININE 9.99*  --   --  10.48*  --  11.67*  --  5.98*  --   --   TROPONINI  --  1.91* 2.45*  --   --  2.22*  --   --   --   --    < > = values in this interval not displayed.    Medical History: Past Medical History:  Diagnosis Date  . Anemia of chronic kidney failure 03/11/2017  . Chest pain 09/01/2018  . Chronic kidney disease 03/11/2017  . Diabetes mellitus without complication ([place]) 7209  . Edema 01/14/2017  . Headache   . Hypercholesterolemia   . Hypertension 03/11/2017  . Iron deficiency anemia 06/18/2016  . RSV (respiratory syncytial virus infection) 08/2017   Assessment:  48 yr old male admitted with chest tightness and concern for NSTEMI.  Troponin up 1.19.  On IV heparin.  1/31 AM update: initial heparin level is low, no issues per RN.   1/31 PM: Heparin level earlier today was undetectable.  Spoke with RN, who confirmed heparin infusion had been off ~1 hour around the time of lab draw.  Pt had coughed up some blood tinged  sputum and heparin was held for this time.  Heparin was restarted ~1100 am.  2/1 AM update: heparin level still below goal, no issues with blood tinged sputum overnight  Goal of Therapy:  Heparin level 0.3-0.7 units/ml Monitor platelets by anticoagulation protocol: Yes   Plan:  Inc heparin to 1400 units/hr Re-check heparin level in 6-8 hours  Narda Bonds, PharmD, [place] Pharmacist Phone: [phone]

## 2018-09-03 NOTE — Plan of Care (Signed)
  Problem: Education: Goal: Knowledge of General Education information will improve Description: Including pain rating scale, medication(s)/side effects and non-pharmacologic comfort measures Outcome: Progressing   Problem: Clinical Measurements: Goal: Ability to maintain clinical measurements within normal limits will improve Outcome: Progressing   

## 2018-09-03 NOTE — Progress Notes (Signed)
ANTICOAGULATION CONSULT NOTE   Pharmacy Consult for Heparin Indication: chest pain/ACS/NSTEMI  No Known Allergies  Patient Measurements: Height: 5\' 2"  (157.5 cm) Weight: 139 lb 5.3 oz (63.2 kg) IBW/kg (Calculated) : 54.6 Heparin Dosing Weight: 71.9 kg  Vital Signs: Temp: 99.6 F (37.6 C) (02/01 2037) Temp Source: Oral (02/01 2037) BP: 144/69 (02/01 2037) Pulse Rate: 84 (02/01 2037)  Labs: Recent Labs    09/01/18 1856  09/02/18 0219  09/02/18 1225 09/02/18 1822 09/03/18 0510 09/03/18 1535 09/03/18 1943 09/03/18 2151  HGB 10.9*  --  10.3*  --   --   --  9.8*  --   --   --   HCT 34.1*  --  32.8*  --   --   --  32.0*  --   --   --   PLT 141*  --  123*  --   --   --  106*  --   --   --   APTT  --   --  65*  --   --   --   --   --   --   --   LABPROT  --   --  15.6*  --   --   --   --   --   --   --   INR  --   --  1.26  --   --   --   --   --   --   --   HEPARINUNFRC  --   --  0.18*   < >  --  0.19* 0.19*  --   --  0.31  CREATININE  --    < > 11.67*  --  5.98*  --   --  9.19*  --   --   TROPONINI 2.45*  --  2.22*  --   --   --   --   --  0.98*  --    < > = values in this interval not displayed.    Medical History: Past Medical History:  Diagnosis Date  . Anemia of chronic kidney failure 03/11/2017  . Chest pain 09/01/2018  . Chronic kidney disease 03/11/2017  . Diabetes mellitus without complication ([place]) 0258  . Edema 01/14/2017  . Headache   . Hypercholesterolemia   . Hypertension 03/11/2017  . Iron deficiency anemia 06/18/2016  . RSV (respiratory syncytial virus infection) 08/2017   Assessment:  48 yr old male admitted with chest tightness and concern for NSTEMI.  Troponin up 1.19.  On IV heparin.  Heparin level came back therapeutic at 0.31, on 1400 units/hr (delayed given was in HD at time of original draw). No s/sx of bleeding. No infusion issues.    Goal of Therapy:  Heparin level 0.3-0.7 units/ml Monitor platelets by anticoagulation protocol: Yes   Plan:   Increase heparin to 1500 units/hr Re-check heparin level in 6-8 hours Monitor daily HL, CBC, and for s/sx of bleeding  Antonietta Jewel, PharmD, [place] Clinical Pharmacist  Pager: [phone] Phone: [phone]

## 2018-09-03 NOTE — Progress Notes (Signed)
Progress Note  Patient Name: Blake Santana Date of Encounter: 09/03/2018  Primary Cardiologist: [name], MD   Subjective   Feeling well.  Continues to have mild chest tightness.  Is now on nasal cannula and off of BiPAP.  Inpatient Medications    Scheduled Meds: . amLODipine  5 mg Oral Q breakfast  . aspirin EC  81 mg Oral Daily  . Chlorhexidine Gluconate Cloth  6 each Topical Q0600  . darbepoetin (ARANESP) injection - DIALYSIS  60 mcg Intravenous Q Sat-HD  . doxercalciferol  3 mcg Intravenous Q T,Th,Sa-HD  . feeding supplement (NEPRO CARB STEADY)  237 mL Oral BID BM  . gabapentin  300 mg Oral QHS  . insulin aspart  0-9 Units Subcutaneous TID WC  . metoprolol tartrate  12.5 mg Oral Once  . metoprolol tartrate  25 mg Oral BID WC  . multivitamin  1 tablet Oral QHS  . rosuvastatin  20 mg Oral q1800  . sodium chloride flush  3 mL Intravenous Q12H  . sucroferric oxyhydroxide  1,000 mg Oral TID WC   Continuous Infusions: . sodium chloride    . sodium chloride    . sodium chloride    . ceFEPime (MAXIPIME) IV    . heparin 1,400 Units/hr (09/03/18 1856)  . nitroGLYCERIN 7.5 mcg/min (09/03/18 0958)  . vancomycin     PRN Meds: sodium chloride, sodium chloride, sodium chloride, alteplase, heparin, lidocaine (PF), lidocaine-prilocaine, nitroGLYCERIN, ondansetron (ZOFRAN) IV, pentafluoroprop-tetrafluoroeth, sodium chloride flush   Vital Signs    Vitals:   09/03/18 0025 09/03/18 0526 09/03/18 0800 09/03/18 0946  BP: 136/70 (!) 145/75  (!) 158/80  Pulse: 85 85 81 87  Resp: (!) 29 (!) 24 19 (!) 21  Temp: 99.5 F (37.5 C) 98.6 F (37 C)    TempSrc: Oral Oral    SpO2: 93% 94% 98% 95%  Weight:  66.4 kg    Height:        Intake/Output Summary (Last 24 hours) at 09/03/2018 1051 Last data filed at 09/03/2018 0106 Gross per 24 hour  Intake 679.86 ml  Output -  Net 679.86 ml   Last 3 Weights 09/03/2018 09/02/2018 09/02/2018  Weight (lbs) 146 lb 4.8 oz 150 lb 2.1 oz 156 lb  4.9 oz  Weight (kg) 66.361 kg 68.1 kg 70.9 kg      Telemetry    Sinus rhythm- Personally Reviewed  ECG    Sinus rhythm, left atrial enlargement, lateral T wave inversions- Personally Reviewed  Physical Exam   GEN: Well nourished, well developed, in no acute distress  HEENT: normal  Neck: no JVD, carotid bruits, or masses Cardiac: RRR; no murmurs, rubs, or gallops,no edema  Respiratory: Crackles at the bases GI: soft, nontender, nondistended, + BS MS: no deformity or atrophy  Skin: warm and dry Neuro:  Strength and sensation are intact Psych: euthymic mood, full affect   Labs    Chemistry Recent Labs  Lab 09/01/18 1216 09/01/18 1904 09/02/18 0219 09/02/18 1225  NA 136 135 138 143  K 6.1* 5.4* 6.9* 4.7  CL 99 94* 101 101  CO2 26 26 25 28   GLUCOSE 184* 188* 284* 213*  BUN 66* 70* 85* 25*  CREATININE 9.99* 10.48* 11.67* 5.98*  CALCIUM 9.1 9.8 9.2 8.8*  PROT 6.8  --   --   --   ALBUMIN 3.3* 3.2* 2.8* 2.8*  AST 16  --   --   --   ALT 14  --   --   --  ALKPHOS 63  --   --   --   BILITOT 0.7  --   --   --   GFRNONAA 5* 5* 5* 10*  GFRAA 6* 6* 5* 12*  ANIONGAP 11 15 12 14      Hematology Recent Labs  Lab 09/01/18 1856 09/02/18 0219 09/03/18 0510  WBC 12.5* 14.3* 10.5  RBC 3.54* 3.42* 3.31*  HGB 10.9* 10.3* 9.8*  HCT 34.1* 32.8* 32.0*  MCV 96.3 95.9 96.7  MCH 30.8 30.1 29.6  MCHC 32.0 31.4 30.6  RDW 14.6 14.6 14.6  PLT 141* 123* 106*    Cardiac Enzymes Recent Labs  Lab 09/01/18 1516 09/01/18 1856 09/02/18 0219  TROPONINI 1.91* 2.45* 2.22*    Recent Labs  Lab 09/01/18 1226  TROPIPOC 0.65*     BNP Recent Labs  Lab 09/01/18 1856  BNP 4,237.8*     DDimer No results for input(s): DDIMER in the last 168 hours.   Radiology    Dg Chest Portable 1 View  Result Date: 09/01/2018 CLINICAL DATA:  Shortness of breath, chest pain and fever. EXAM: PORTABLE CHEST 1 VIEW COMPARISON:  PA and lateral chest 03/20/2018. Single-view of the chest  08/09/2017. FINDINGS: There is left worse than right basilar airspace disease. Cardiomegaly and vascular congestion noted. No pneumothorax or pleural effusion. No acute or focal bony abnormality. IMPRESSION: Left worse than right basilar airspace disease worrisome for pneumonia. Cardiomegaly and pulmonary vascular congestion. Electronically Signed   By: Inge Rise M.D.   On: 09/01/2018 13:27    Cardiac Studies   TTE  1. The left ventricle has low normal systolic function of 04-54%. The cavity size is normal. There is moderate left ventricular wall thickness. Echo evidence of Indeterminate diastolic filling patterns.  2. Normal left atrial size.  3. Normal right atrial size.  4. Trivial pericardial effusion, as described above.  5. The mitral valve Thickened. There is mild thickening. Regurgitation is mild by color flow Doppler.  6. Normal tricuspid valve.  7. Tricuspid regurgitation is mild.  8. The aortic valve tricuspid. There is mild thickening of the aortic valve.  9. No atrial level shunt detected by color flow Doppler.  Patient Profile     48 y.o. male with a hx of ESRD with AV graft, DM, HLD, HTN- not always controlled though at dialysis it runs low, and iron def. anemia who is being seen today for the evaluation of chest pain and syncope  Assessment & Plan    1.Chest pain/NSTEMI -pain described as a pressure sensation.  He did have a rise and fall in his troponins.  His LV function is low normal on echocardiogram.  He has had a pneumonia and thus has not had a left heart catheterization.  He has been afebrile overnight.  We Eileen Croswell thus potentially plan for left heart catheterization on Monday.  Would likely delay heart catheterization if he has more evidence of infection with fevers.    2.  Hyperlipidemia Goal LDL less than 70.  Continue Crestor.  3.  Diabetes Mellitus A1c 7.4.  Plan per primary team.  4.  Pneumonia Currently afebrile.  On nasal cannula oxygen.  Plan per  primary team.  5.  ESRD on HD Volume management per nephrology.  Does have crackles at the bases.  6.  Hyperkalemia Plan per nephrology and primary team.  7.  Hypertension -Well-controlled this morning.  No changes.  8.  PreSyncope Unclear as to the cause of his syncope.  Potentially related to infectious  process.     For questions or updates, please contact [place] Please consult [email or web address] for contact info under        Signed, Faelyn Sigler Meredith Leeds, MD  09/03/2018, 10:51 AM

## 2018-09-04 DIAGNOSIS — I5043 Acute on chronic combined systolic (congestive) and diastolic (congestive) heart failure: Secondary | ICD-10-CM

## 2018-09-04 LAB — CBC
HCT: 31.7 % — ABNORMAL LOW (ref 39.0–52.0)
Hemoglobin: 10.3 g/dL — ABNORMAL LOW (ref 13.0–17.0)
MCH: 31.1 pg (ref 26.0–34.0)
MCHC: 32.5 g/dL (ref 30.0–36.0)
MCV: 95.8 fL (ref 80.0–100.0)
Platelets: 122 10*3/uL — ABNORMAL LOW (ref 150–400)
RBC: 3.31 MIL/uL — ABNORMAL LOW (ref 4.22–5.81)
RDW: 14.4 % (ref 11.5–15.5)
WBC: 8.9 10*3/uL (ref 4.0–10.5)
nRBC: 0 % (ref 0.0–0.2)

## 2018-09-04 LAB — RENAL FUNCTION PANEL
ALBUMIN: 2.6 g/dL — AB (ref 3.5–5.0)
Anion gap: 13 (ref 5–15)
BUN: 37 mg/dL — ABNORMAL HIGH (ref 6–20)
CO2: 27 mmol/L (ref 22–32)
CREATININE: 6.38 mg/dL — AB (ref 0.61–1.24)
Calcium: 9.1 mg/dL (ref 8.9–10.3)
Chloride: 96 mmol/L — ABNORMAL LOW (ref 98–111)
GFR calc Af Amer: 11 mL/min — ABNORMAL LOW (ref >60)
GFR calc non Af Amer: 9 mL/min — ABNORMAL LOW (ref >60)
Glucose, Bld: 201 mg/dL — ABNORMAL HIGH (ref 70–99)
Phosphorus: 4.1 mg/dL (ref 2.5–4.6)
Potassium: 4.3 mmol/L (ref 3.5–5.1)
Sodium: 136 mmol/L (ref 135–145)

## 2018-09-04 LAB — GLUCOSE, CAPILLARY
Glucose-Capillary: 156 mg/dL — ABNORMAL HIGH (ref 70–99)
Glucose-Capillary: 212 mg/dL — ABNORMAL HIGH (ref 70–99)
Glucose-Capillary: 222 mg/dL — ABNORMAL HIGH (ref 70–99)
Glucose-Capillary: 174 mg/dL — ABNORMAL HIGH (ref 70–99)

## 2018-09-04 LAB — HEPARIN LEVEL (UNFRACTIONATED)
Heparin Unfractionated: 0.26 IU/mL — ABNORMAL LOW (ref 0.30–0.70)
Heparin Unfractionated: 0.39 IU/mL (ref 0.30–0.70)

## 2018-09-04 LAB — PROCALCITONIN: Procalcitonin: 69.66 ng/mL

## 2018-09-04 MED ORDER — SODIUM CHLORIDE 0.9 % IV SOLN: INTRAVENOUS | Status: DC

## 2018-09-04 MED ORDER — SODIUM CHLORIDE 0.9% FLUSH: 3.0000 mL | INTRAVENOUS | Status: DC | PRN

## 2018-09-04 MED ORDER — SODIUM CHLORIDE 0.9 % IV SOLN: 250.0000 mL | INTRAVENOUS | Status: DC | PRN

## 2018-09-04 MED ORDER — SODIUM CHLORIDE 0.9% FLUSH
3.0000 mL | Freq: Two times a day (BID) | INTRAVENOUS | Status: DC
  Administered 2018-09-05: 3 mL via INTRAVENOUS

## 2018-09-04 NOTE — Progress Notes (Signed)
Family Medicine Teaching Service Daily Progress Note Intern Pager: [phone]  Patient name: Blake Santana Medical record number: [id number] Date of birth: 1971-07-19 Age: 48 y.o. Gender: male  Primary Care Provider: Mack Hook, MD Consultants: Cardiology, Nephrology Code Status: Full  Pt Overview and Major Events to Date:  1/30 admitted to [facility], heparin dtt, Vanc/Cefepime x1 (1/30) in ED for HCAP, Azithro/CTX (1/30-) for CAP, K+ 6.1 s/p D5/insulin/calcium in ED 1/31: febrile to t-max 103.3, HD, d/c Azithro/CTX (1/30-1/31), begin Vanc/Cefepime (1/31-)  Assessment and Plan: Blake Santana a 48 y.o.malepresenting with atypical chest pain, weakness, orthopnea, and fever. PMH is significant forESRD with AV graft, T2DM, HLD, HTN, and iron deficiency anemia.  NSTEMI:  Endorses only mild chest tightness this morning, improved from yesterday. Has been well controlled with nitro drip without need for PRN. Left heart cath planned for Monday 2/3 if remains afebrile and continues to improve from infectious standpoint. - Per cards, plan for left heart cath on Monday 2/3 - Continue ASA 81mg , Crestor 20mg , Lopressor 25mg   - Continue heparin drip - PRN nitro - EKG prn for chest pain  Acute Hypoxic Respiratory Failure 2/2 CAP +/- Hypervolemia: Improving Improving clinically. On 4L O2 ON, weaned to 2L this AM with stable O2 sats. Leukocytosis resolved (WBC 8.9). Has remained afebrile >24 hours (last fever 102.3 at 0930 on 1/31). Blood culture neg x 2 days. Procalcitonin continues to increase (3.72>55.08>69.66 this AM). Currently on Vanc and Cefepime.  - Continue Vanc and Cefepime  (1/31-) - Discuss with CCM concerning Procalcitonin - consider narrowing antibiotics/transition to PO pending CCM discussion - wean O2 as tolerated  HFpEF:  Echo on 09/02/2018 with low to normal ejection fraction of 50 to 55%.   ESRDon HD TTS: Received HD on 2/1 without complication. Kidney function  stable.  - HD per nephrology   T2DM Peripheral Neuropathy: Last A1C 7.4 on 08/26/18. Home meds: Glipizide 5mg  QD and Gabapentin 300mg  qHS. CBG ON: 150-200. - Hold home Glipizide - sSSI - monitor CBGs - continue home gabapentin  HLD: stable - Continue Crestor 20mg    HTN: BP elevated ON (140-150's SBP). Home meds: Amlodipine 5mg  QD, Lopressor 12.5mg  BID. Per cards, increase home Metoprolol to 25mg  BID - Continue Lopressor 25mg , Amlodipine 5mg  - monitor vitals  H/o Iron Deficiency Anemia: stable at baseline (10-11) - continue to monitor - ESA per nephro  FEN/GI: Renal Diet, Zofran PRN PPx: Heparin drip  Disposition: Pending improvement from PNA and card recs/LHC on 2/3  Subjective:  Patient admits to only mild substernal chest tightness that is better than yesterday. Denies any SOB, abdominal pain, nausea, or vomiting. He is tolerating PO well and has no other complaints or concerns.  Objective: Temp:  [97.2 F (36.2 C)-99.6 F (37.6 C)] 98.3 F (36.8 C) (02/02 1244) Pulse Rate:  [65-84] 66 (02/02 1244) Resp:  [15-27] 27 (02/02 0830) BP: (108-155)/(58-84) 155/83 (02/02 1244) SpO2:  [94 %-98 %] 95 % (02/02 1244) Weight:  [63.2 kg-66.5 kg] 64.1 kg (02/02 0609) Physical Exam: General: well nourished, well developed, in no acute distress with non-toxic appearance, lying comfortably in bed with nasal canula in place HEENT: normocephalic, atraumatic, moist mucous membranes CV: regular rate and rhythm without murmurs, rubs, or gallops, no lower extremity edema Lungs: crackles in lower lobes bilaterally, otherwise clear with normal work of breathing Abdomen: soft, non-tender, non-distended, normoactive bowel sounds Skin: warm, dry, no rashes or lesions Extremities: warm and well perfused, normal tone Neuro: Alert and oriented, speech normal  Laboratory: Recent Labs  Lab 09/02/18 0219 09/03/18 0510 09/04/18 0446  WBC 14.3* 10.5 8.9  HGB 10.3* 9.8* 10.3*  HCT 32.8*  32.0* 31.7*  PLT 123* 106* 122*   Recent Labs  Lab 09/01/18 1216  09/02/18 1225 09/03/18 1535 09/04/18 0446  NA 136   < > 143 136 136  K 6.1*   < > 4.7 5.2* 4.3  CL 99   < > 101 97* 96*  CO2 26   < > 28 26 27   BUN 66*   < > 25* 65* 37*  CREATININE 9.99*   < > 5.98* 9.19* 6.38*  CALCIUM 9.1   < > 8.8* 9.2 9.1  PROT 6.8  --   --   --   --   BILITOT 0.7  --   --   --   --   ALKPHOS 63  --   --   --   --   ALT 14  --   --   --   --   AST 16  --   --   --   --   GLUCOSE 184*   < > 213* 246* 201*   < > = values in this interval not displayed.    EKG (1/30): NSR with lateral T wave depression, more pronounced than last year  EKG (1/31): NSR with nonspecific ST changes EKG (2/1): NSR, no lateral T wave depression appreciated Troponin: 0.65 Na 136, K 6.1>5.4>6.9>4.7>5.2>4.3, BUN 66, Cr 9.99, Mag 2.5 WBC 11.8>12.5>14.3>10.5>8.9, Hgb 10.4>10.9>10.3, Plts 067>703>403 Flu: negative Magnesium: 2.5 (h) Lactic acid: 1.1>1.5 Blood culture: pending I-stat troponin: 0.65 Troponin: 1.91>2.45>2.22 TSH 1.174 BNP 4237.8 ABG: pH 7.438, pCO2 65.6, bicarb 28.1 procalcitonin 3.72>55.08>69.66 PT/INR: 15.6/1.26 Blood cx x 2: NGTD  Imaging/Diagnostic Tests: No results found.  Mina Marble Webster, DO 09/04/2018, 2:00 PM PGY-1, [place] Intern pager: [phone], text pages welcome

## 2018-09-04 NOTE — Plan of Care (Signed)
  Problem: Clinical Measurements: °Goal: Ability to maintain clinical measurements within normal limits will improve °Outcome: Progressing °Goal: Respiratory complications will improve °Outcome: Progressing °  °Problem: Clinical Measurements: °Goal: Respiratory complications will improve °Outcome: Progressing °  °

## 2018-09-04 NOTE — Progress Notes (Signed)
ANTICOAGULATION CONSULT NOTE   Pharmacy Consult for Heparin Indication: chest pain/ACS/NSTEMI  No Known Allergies  Patient Measurements: Height: 5\' 2"  (157.5 cm) Weight: 141 lb 4.8 oz (64.1 kg) IBW/kg (Calculated) : 54.6 Heparin Dosing Weight: 71.9 kg  Vital Signs: Temp: 99.6 F (37.6 C) (02/01 2037) Temp Source: Oral (02/01 2037) BP: 140/84 (02/02 0609) Pulse Rate: 65 (02/02 0609)  Labs: Recent Labs    09/01/18 1856  09/02/18 0219  09/02/18 1225  09/03/18 0510 09/03/18 1535 09/03/18 1943 09/03/18 2151 09/04/18 0446  HGB 10.9*  --  10.3*  --   --   --  9.8*  --   --   --  10.3*  HCT 34.1*  --  32.8*  --   --   --  32.0*  --   --   --  31.7*  PLT 141*  --  123*  --   --   --  106*  --   --   --  122*  APTT  --   --  65*  --   --   --   --   --   --   --   --   LABPROT  --   --  15.6*  --   --   --   --   --   --   --   --   INR  --   --  1.26  --   --   --   --   --   --   --   --   HEPARINUNFRC  --   --  0.18*   < >  --    < > 0.19*  --   --  0.31 0.26*  CREATININE  --    < > 11.67*  --  5.98*  --   --  9.19*  --   --  6.38*  TROPONINI 2.45*  --  2.22*  --   --   --   --   --  0.98*  --   --    < > = values in this interval not displayed.    Medical History: Past Medical History:  Diagnosis Date  . Anemia of chronic kidney failure 03/11/2017  . Chest pain 09/01/2018  . Chronic kidney disease 03/11/2017  . Diabetes mellitus without complication ([place]) 1610  . Edema 01/14/2017  . Headache   . Hypercholesterolemia   . Hypertension 03/11/2017  . Iron deficiency anemia 06/18/2016  . RSV (respiratory syncytial virus infection) 08/2017   Assessment:  48 yr old male admitted with chest tightness and concern for NSTEMI.  Troponin up 1.19.  On IV heparin.  Heparin level is subtherapeutic this AM at 0.26, on 1500 units/hr. No s/sx of bleeding. No infusion issues per RN.   Goal of Therapy:  Heparin level 0.3-0.7 units/ml Monitor platelets by anticoagulation protocol: Yes    Plan:  Increase heparin to 1650 units/hr Re-check heparin level in 8 hours Monitor daily HL, CBC, and for s/sx of bleeding  Thank you for allowing pharmacy to be a part of this patient's care.  Leron Croak, PharmD PGY1 Pharmacy Resident Phone: [phone]  Please check AMION for all [place] phone numbers

## 2018-09-04 NOTE — Progress Notes (Addendum)
[facility] Progress Note   Subjective: Seen in room, looks comfortable with nasal oxygen. No overt dyspnea, still with chest tightness. Per notes, plan is for heart cath tomorrow.    Objective Vitals:   09/03/18 1800 09/03/18 1815 09/03/18 2037 09/04/18 0609  BP: 129/65 115/60 (!) 144/69 140/84  Pulse: 70 70 84 65  Resp:  18 20 18   Temp:  98.4 F (36.9 C) 99.6 F (37.6 C)   TempSrc:  Oral Oral   SpO2:  97% 94% 94%  Weight:  63.2 kg  64.1 kg  Height:       Physical Exam General:Well appearing man, NAD. Wearing nasal oxygen. Heart:RRR; no murmur Lungs:Clear in upper lobes, + bibasilar rales present Abdomen:soft, non-tender Extremities:No LE edema Dialysis Access:AVF + thrill  Additional Objective Labs: Basic Metabolic Panel: Recent Labs  Lab 09/02/18 1225 09/03/18 1535 09/04/18 0446  NA 143 136 136  K 4.7 5.2* 4.3  CL 101 97* 96*  CO2 28 26 27   GLUCOSE 213* 246* 201*  BUN 25* 65* 37*  CREATININE 5.98* 9.19* 6.38*  CALCIUM 8.8* 9.2 9.1  PHOS 3.6 3.0 4.1   Liver Function Tests: Recent Labs  Lab 09/01/18 1216  09/02/18 1225 09/03/18 1535 09/04/18 0446  AST 16  --   --   --   --   ALT 14  --   --   --   --   ALKPHOS 63  --   --   --   --   BILITOT 0.7  --   --   --   --   PROT 6.8  --   --   --   --   ALBUMIN 3.3*   < > 2.8* 2.5* 2.6*   < > = values in this interval not displayed.   CBC: Recent Labs  Lab 09/01/18 1216 09/01/18 1856 09/02/18 0219 09/03/18 0510 09/04/18 0446  WBC 11.8* 12.5* 14.3* 10.5 8.9  NEUTROABS 9.2*  --   --   --   --   HGB 10.4* 10.9* 10.3* 9.8* 10.3*  HCT 33.5* 34.1* 32.8* 32.0* 31.7*  MCV 96.5 96.3 95.9 96.7 95.8  PLT 139* 141* 123* 106* 122*   Blood Culture    Component Value Date/Time   SDES BLOOD RIGHT ANTECUBITAL 09/01/2018 1353   SPECREQUEST  09/01/2018 1353    BOTTLES DRAWN AEROBIC AND ANAEROBIC Blood Culture results may not be optimal due to an excessive volume of blood received in culture  bottles Performed at [facility] [address], [place], [place] [address]    CULT NO GROWTH 3 DAYS 09/01/2018 1353   REPTSTATUS PENDING 09/01/2018 1353    Cardiac Enzymes: Recent Labs  Lab 09/01/18 1516 09/01/18 1856 09/02/18 0219 09/03/18 1943  TROPONINI 1.91* 2.45* 2.22* 0.98*   CBG: Recent Labs  Lab 09/02/18 2059 09/03/18 0730 09/03/18 1112 09/03/18 1903 09/04/18 0746  GLUCAP 147* 144* 226* 123* 156*   Medications: . sodium chloride    . ceFEPime (MAXIPIME) IV 2 g (09/03/18 2127)  . heparin 1,650 Units/hr (09/04/18 0811)  . nitroGLYCERIN 7.5 mcg/min (09/03/18 0958)  . vancomycin 750 mg (09/03/18 1746)   . amLODipine  5 mg Oral Q breakfast  . aspirin EC  81 mg Oral Daily  . Chlorhexidine Gluconate Cloth  6 each Topical Q0600  . darbepoetin (ARANESP) injection - DIALYSIS  60 mcg Intravenous Q Sat-HD  . doxercalciferol  3 mcg Intravenous Q T,Th,Sa-HD  . feeding supplement (NEPRO CARB STEADY)  237 mL Oral BID BM  . gabapentin  300 mg Oral QHS  . insulin aspart  0-9 Units Subcutaneous TID WC  . metoprolol tartrate  12.5 mg Oral Once  . metoprolol tartrate  25 mg Oral BID WC  . multivitamin  1 tablet Oral QHS  . rosuvastatin  20 mg Oral q1800  . sodium chloride flush  3 mL Intravenous Q12H  . sucroferric oxyhydroxide  1,000 mg Oral TID WC    Dialysis Orders: TTS [facility] 3.75 hours EDW 64.5 2K 2 Ca heparin 2000 Hectorol 3 no ESA/Fe Recent labs: hgb 12.6 last Mircera 50 on 1/07 42% sat iPTH 279  Assessment/Plan: 1. Acute hypoxic respiratory failure with fever: HCAP +/- volume excess. Flu negative. BCx pending. ^ Pro-calcitonin and BNP. Remains on Vanc/Cefepime. 2. NSTEMI: + CP on admit, Trop peaked 2.45, now trending down. Cardiology following. On heparin/NTG drips. For LHC on 2/3. 3. Hyperkalemia:On admit,  resolved with HD. 4. ESRD: Usually TTS schedule. S/p HD overnight on admit d/t volume and ^ K. Now back to usual schedule, next  2/4. 5. Hypertension/volume: Variable weights here, getting closer to euvolemia. 6. Anemiaof ESRD: Hgb 10.3, continue Aranesp q Sat here. 7. Metabolic bone disease: Corr Ca/Phos fine. Continue binders (Velphoro) and VDRA. 8. Nutrition: Alb low, continue Nepro supplements. 9. DM: On insulin, per primary.   Veneta Penton, PA-C 09/04/2018, 9:12 AM  [employer] Pager: [phone]

## 2018-09-04 NOTE — Progress Notes (Addendum)
C/O intermittent Nose Bleed over night that resolved without intervention. Humidification added to O2 @ 4L via [place]. Will monitor.

## 2018-09-04 NOTE — Progress Notes (Signed)
Progress Note  Patient Name: Blake Santana Date of Encounter: 09/04/2018  Primary Cardiologist: Blake Him, MD   Subjective   Currently feeling well.  No further chest pain.  Inpatient Medications    Scheduled Meds: . amLODipine  5 mg Oral Q breakfast  . aspirin EC  81 mg Oral Daily  . Chlorhexidine Gluconate Cloth  6 each Topical Q0600  . darbepoetin (ARANESP) injection - DIALYSIS  60 mcg Intravenous Q Sat-HD  . doxercalciferol  3 mcg Intravenous Q T,Th,Sa-HD  . feeding supplement (NEPRO CARB STEADY)  237 mL Oral BID BM  . gabapentin  300 mg Oral QHS  . insulin aspart  0-9 Units Subcutaneous TID WC  . metoprolol tartrate  12.5 mg Oral Once  . metoprolol tartrate  25 mg Oral BID WC  . multivitamin  1 tablet Oral QHS  . rosuvastatin  20 mg Oral q1800  . sodium chloride flush  3 mL Intravenous Q12H  . sucroferric oxyhydroxide  1,000 mg Oral TID WC   Continuous Infusions: . sodium chloride    . ceFEPime (MAXIPIME) IV 2 g (09/03/18 2127)  . heparin 1,650 Units/hr (09/04/18 0811)  . nitroGLYCERIN 7.5 mcg/min (09/03/18 0958)  . vancomycin 750 mg (09/03/18 1746)   PRN Meds: sodium chloride, ondansetron (ZOFRAN) IV, sodium chloride flush   Vital Signs    Vitals:   09/03/18 2037 09/04/18 0609 09/04/18 0830 09/04/18 1244  BP: (!) 144/69 140/84  (!) 155/83  Pulse: 84 65 70 66  Resp: 20 18 (!) 27   Temp: 99.6 F (37.6 C)   98.3 F (36.8 C)  TempSrc: Oral   Oral  SpO2: 94% 94% 98% 95%  Weight:  64.1 kg    Height:        Intake/Output Summary (Last 24 hours) at 09/04/2018 1310 Last data filed at 09/04/2018 1100 Gross per 24 hour  Intake 910.97 ml  Output 2800 ml  Net -1889.03 ml   Last 3 Weights 09/04/2018 09/03/2018 09/03/2018  Weight (lbs) 141 lb 4.8 oz 139 lb 5.3 oz 146 lb 9.7 oz  Weight (kg) 64.093 kg 63.2 kg 66.5 kg      Telemetry    Sinus rhythm- Personally Reviewed  ECG    None new- Personally Reviewed  Physical Exam   GEN: Well nourished, well  developed, in no acute distress  HEENT: normal  Neck: no JVD, carotid bruits, or masses Cardiac: RRR; no murmurs, rubs, or gallops,no edema  Respiratory:  clear to auscultation bilaterally, normal work of breathing GI: soft, nontender, nondistended, + BS MS: no deformity or atrophy  Skin: warm and dry Neuro:  Strength and sensation are intact Psych: euthymic mood, full affect    Labs    Chemistry Recent Labs  Lab 09/01/18 1216  09/02/18 1225 09/03/18 1535 09/04/18 0446  NA 136   < > 143 136 136  K 6.1*   < > 4.7 5.2* 4.3  CL 99   < > 101 97* 96*  CO2 26   < > 28 26 27   GLUCOSE 184*   < > 213* 246* 201*  BUN 66*   < > 25* 65* 37*  CREATININE 9.99*   < > 5.98* 9.19* 6.38*  CALCIUM 9.1   < > 8.8* 9.2 9.1  PROT 6.8  --   --   --   --   ALBUMIN 3.3*   < > 2.8* 2.5* 2.6*  AST 16  --   --   --   --  ALT 14  --   --   --   --   ALKPHOS 63  --   --   --   --   BILITOT 0.7  --   --   --   --   GFRNONAA 5*   < > 10* 6* 9*  GFRAA 6*   < > 12* 7* 11*  ANIONGAP 11   < > 14 13 13    < > = values in this interval not displayed.     Hematology Recent Labs  Lab 09/02/18 0219 09/03/18 0510 09/04/18 0446  WBC 14.3* 10.5 8.9  RBC 3.42* 3.31* 3.31*  HGB 10.3* 9.8* 10.3*  HCT 32.8* 32.0* 31.7*  MCV 95.9 96.7 95.8  MCH 30.1 29.6 31.1  MCHC 31.4 30.6 32.5  RDW 14.6 14.6 14.4  PLT 123* 106* 122*    Cardiac Enzymes Recent Labs  Lab 09/01/18 1516 09/01/18 1856 09/02/18 0219 09/03/18 1943  TROPONINI 1.91* 2.45* 2.22* 0.98*    Recent Labs  Lab 09/01/18 1226  TROPIPOC 0.65*     BNP Recent Labs  Lab 09/01/18 1856  BNP 4,237.8*     DDimer No results for input(s): DDIMER in the last 168 hours.   Radiology    No results found.  Cardiac Studies   TTE  1. The left ventricle has low normal systolic function of 38-75%. The cavity size is normal. There is moderate left ventricular wall thickness. Echo evidence of Indeterminate diastolic filling patterns.  2. Normal  left atrial size.  3. Normal right atrial size.  4. Trivial pericardial effusion, as described above.  5. The mitral valve Thickened. There is mild thickening. Regurgitation is mild by color flow Doppler.  6. Normal tricuspid valve.  7. Tricuspid regurgitation is mild.  8. The aortic valve tricuspid. There is mild thickening of the aortic valve.  9. No atrial level shunt detected by color flow Doppler.  Patient Profile     48 y.o. male with a hx of ESRD with AV graft, DM, HLD, HTN- not always controlled though at dialysis it runs low, and iron def. anemia who is being seen today for the evaluation of chest pain and syncope  Assessment & Plan    1.Chest pain/NSTEMI -atypical rise and fall in his troponins.  Despite his end-stage renal disease I do feel that this is likely a non-STEMI.  We Blake Santana thus plan for left heart catheterization tomorrow.    The patient understands that risks include but are not limited to stroke (1 in 1000), death (1 in 55), kidney failure [usually temporary] (1 in 500), bleeding (1 in 200), allergic reaction [possibly serious] (1 in 200), and agrees to proceed.  2.  Hyperlipidemia LDL less than 70.  Continue Crestor  3.  Diabetes Mellitus A1c 7.4.  Per primary team  4.  Pneumonia Currently afebrile on nasal cannula oxygen  5.  ESRD on HD Volume management per nephrology  6.  Hyperkalemia Plan per nephrology  7.  Hypertension Well-controlled.  No changes.  8.  PreSyncope Unclear as to the cause.  Continue evaluation with telemetry.     For questions or updates, please contact [place] Please consult [email or web address] for contact info under        Signed, Lanita Stammen Meredith Leeds, MD  09/04/2018, 1:10 PM

## 2018-09-04 NOTE — Progress Notes (Signed)
ANTICOAGULATION CONSULT NOTE   Pharmacy Consult for Heparin Indication: chest pain/ACS/NSTEMI  No Known Allergies  Patient Measurements: Height: 5\' 2"  (157.5 cm) Weight: 141 lb 4.8 oz (64.1 kg) IBW/kg (Calculated) : 54.6 Heparin Dosing Weight: 71.9 kg  Vital Signs: Temp: 98.4 F (36.9 C) (02/02 1419) Temp Source: Oral (02/02 1419) BP: 133/77 (02/02 1419) Pulse Rate: 65 (02/02 1419)  Labs: Recent Labs    09/01/18 1856  09/02/18 0219  09/02/18 1225  09/03/18 0510 09/03/18 1535 09/03/18 1943 09/03/18 2151 09/04/18 0446 09/04/18 1604  HGB 10.9*  --  10.3*  --   --   --  9.8*  --   --   --  10.3*  --   HCT 34.1*  --  32.8*  --   --   --  32.0*  --   --   --  31.7*  --   PLT 141*  --  123*  --   --   --  106*  --   --   --  122*  --   APTT  --   --  65*  --   --   --   --   --   --   --   --   --   LABPROT  --   --  15.6*  --   --   --   --   --   --   --   --   --   INR  --   --  1.26  --   --   --   --   --   --   --   --   --   HEPARINUNFRC  --   --  0.18*   < >  --    < > 0.19*  --   --  0.31 0.26* 0.39  CREATININE  --    < > 11.67*  --  5.98*  --   --  9.19*  --   --  6.38*  --   TROPONINI 2.45*  --  2.22*  --   --   --   --   --  0.98*  --   --   --    < > = values in this interval not displayed.    Medical History: Past Medical History:  Diagnosis Date  . Anemia of chronic kidney failure 03/11/2017  . Chest pain 09/01/2018  . Chronic kidney disease 03/11/2017  . Diabetes mellitus without complication ([place]) 5784  . Edema 01/14/2017  . Headache   . Hypercholesterolemia   . Hypertension 03/11/2017  . Iron deficiency anemia 06/18/2016  . RSV (respiratory syncytial virus infection) 08/2017   Assessment:  48 yr old male admitted with chest tightness and concern for NSTEMI.  Troponin up 1.19.  On IV heparin.  Heparin level is therapeutic this morning at 0.39, on 1650 units/hr. Hgb 10.3, plt 122. No s/sx of bleeding. No infusion issues.   Goal of Therapy:  Heparin  level 0.3-0.7 units/ml Monitor platelets by anticoagulation protocol: Yes   Plan:  Continue heparin at 1650 units/hr Monitor daily HL, CBC, and for s/sx of bleeding  Thank you for allowing pharmacy to be a part of this patient's care.  Antonietta Jewel, PharmD, [facility] Clinical Pharmacist  Pager: [phone] Phone: [phone] Please check AMION for all [facility] phone numbers

## 2018-09-05 ENCOUNTER — Encounter (HOSPITAL_COMMUNITY)
Admission: EM | Disposition: A | Discharge status: Home / Self Care | Payer: Self-pay | Source: Home / Self Care | Attending: Family Medicine

## 2018-09-05 DIAGNOSIS — N186 End stage renal disease: Secondary | ICD-10-CM

## 2018-09-05 DIAGNOSIS — D649 Anemia, unspecified: Secondary | ICD-10-CM

## 2018-09-05 DIAGNOSIS — J181 Lobar pneumonia, unspecified organism: Secondary | ICD-10-CM

## 2018-09-05 DIAGNOSIS — E1122 Type 2 diabetes mellitus with diabetic chronic kidney disease: Secondary | ICD-10-CM

## 2018-09-05 HISTORY — PX: LEFT HEART CATH AND CORONARY ANGIOGRAPHY: CATH118249

## 2018-09-05 LAB — CBC
HCT: 32.1 % — ABNORMAL LOW (ref 39.0–52.0)
Hemoglobin: 10.4 g/dL — ABNORMAL LOW (ref 13.0–17.0)
MCH: 30.5 pg (ref 26.0–34.0)
MCHC: 32.4 g/dL (ref 30.0–36.0)
MCV: 94.1 fL (ref 80.0–100.0)
PLATELETS: 132 10*3/uL — AB (ref 150–400)
RBC: 3.41 MIL/uL — ABNORMAL LOW (ref 4.22–5.81)
RDW: 14.3 % (ref 11.5–15.5)
WBC: 9.7 10*3/uL (ref 4.0–10.5)
nRBC: 0 % (ref 0.0–0.2)

## 2018-09-05 LAB — RENAL FUNCTION PANEL
Albumin: 2.6 g/dL — ABNORMAL LOW (ref 3.5–5.0)
Anion gap: 18 — ABNORMAL HIGH (ref 5–15)
BUN: 71 mg/dL — ABNORMAL HIGH (ref 6–20)
CO2: 24 mmol/L (ref 22–32)
Calcium: 9.5 mg/dL (ref 8.9–10.3)
Chloride: 93 mmol/L — ABNORMAL LOW (ref 98–111)
Creatinine, Ser: 9.25 mg/dL — ABNORMAL HIGH (ref 0.61–1.24)
GFR calc Af Amer: 7 mL/min — ABNORMAL LOW (ref >60)
GFR calc non Af Amer: 6 mL/min — ABNORMAL LOW (ref >60)
GLUCOSE: 159 mg/dL — AB (ref 70–99)
Phosphorus: 3.9 mg/dL (ref 2.5–4.6)
Potassium: 4.4 mmol/L (ref 3.5–5.1)
Sodium: 135 mmol/L (ref 135–145)

## 2018-09-05 LAB — GLUCOSE, CAPILLARY
GLUCOSE-CAPILLARY: 139 mg/dL — AB (ref 70–99)
Glucose-Capillary: 155 mg/dL — ABNORMAL HIGH (ref 70–99)
Glucose-Capillary: 142 mg/dL — ABNORMAL HIGH (ref 70–99)
Glucose-Capillary: 121 mg/dL — ABNORMAL HIGH (ref 70–99)
Glucose-Capillary: 231 mg/dL — ABNORMAL HIGH (ref 70–99)

## 2018-09-05 LAB — HEPARIN LEVEL (UNFRACTIONATED): Heparin Unfractionated: 0.46 IU/mL (ref 0.30–0.70)

## 2018-09-05 LAB — POCT ACTIVATED CLOTTING TIME: ACTIVATED CLOTTING TIME: 169 s

## 2018-09-05 SURGERY — LEFT HEART CATH AND CORONARY ANGIOGRAPHY
Anesthesia: LOCAL

## 2018-09-05 MED ORDER — LEVOFLOXACIN 750 MG PO TABS
750.0000 mg | ORAL_TABLET | Freq: Once | ORAL | Status: AC
  Administered 2018-09-05: 750 mg via ORAL
  Filled 2018-09-05: qty 1

## 2018-09-05 MED ORDER — MIDAZOLAM HCL 2 MG/2ML IJ SOLN
INTRAMUSCULAR | Status: AC
  Filled 2018-09-05: qty 2

## 2018-09-05 MED ORDER — MIDAZOLAM HCL 2 MG/2ML IJ SOLN
INTRAMUSCULAR | Status: DC | PRN
  Administered 2018-09-05: 1 mg via INTRAVENOUS

## 2018-09-05 MED ORDER — LIDOCAINE HCL (PF) 1 % IJ SOLN
INTRAMUSCULAR | Status: AC
Start: 2018-09-05 — End: ?
  Filled 2018-09-05: qty 30

## 2018-09-05 MED ORDER — FENTANYL CITRATE (PF) 100 MCG/2ML IJ SOLN
INTRAMUSCULAR | Status: DC | PRN
  Administered 2018-09-05: 25 ug via INTRAVENOUS

## 2018-09-05 MED ORDER — ACETAMINOPHEN 325 MG PO TABS
650.0000 mg | ORAL_TABLET | ORAL | Status: DC | PRN
  Administered 2018-09-06: 650 mg via ORAL
  Filled 2018-09-05: qty 2

## 2018-09-05 MED ORDER — HEPARIN (PORCINE) IN NACL 1000-0.9 UT/500ML-% IV SOLN
INTRAVENOUS | Status: DC | PRN
  Administered 2018-09-05 (×2): 500 mL

## 2018-09-05 MED ORDER — LEVOFLOXACIN 500 MG PO TABS
500.0000 mg | ORAL_TABLET | ORAL | Status: AC
  Administered 2018-09-07: 500 mg via ORAL
  Filled 2018-09-05: qty 1

## 2018-09-05 MED ORDER — SODIUM CHLORIDE 0.9 % IV SOLN: 250.0000 mL | INTRAVENOUS | Status: DC | PRN

## 2018-09-05 MED ORDER — SODIUM CHLORIDE 0.9% FLUSH: 3.0000 mL | INTRAVENOUS | Status: DC | PRN

## 2018-09-05 MED ORDER — SODIUM CHLORIDE 0.9% FLUSH
3.0000 mL | Freq: Two times a day (BID) | INTRAVENOUS | Status: DC
  Administered 2018-09-06 – 2018-09-07 (×3): 3 mL via INTRAVENOUS

## 2018-09-05 MED ORDER — IOHEXOL 350 MG/ML SOLN
INTRAVENOUS | Status: DC | PRN
  Administered 2018-09-05: 25 mL via INTRACARDIAC

## 2018-09-05 MED ORDER — HEPARIN (PORCINE) IN NACL 1000-0.9 UT/500ML-% IV SOLN
INTRAVENOUS | Status: AC
  Filled 2018-09-05: qty 1000

## 2018-09-05 MED ORDER — FENTANYL CITRATE (PF) 100 MCG/2ML IJ SOLN
INTRAMUSCULAR | Status: AC
  Filled 2018-09-05: qty 2

## 2018-09-05 MED ORDER — LIDOCAINE HCL (PF) 1 % IJ SOLN
INTRAMUSCULAR | Status: DC | PRN
  Administered 2018-09-05: 15 mL

## 2018-09-05 SURGICAL SUPPLY — 10 items
CATH INFINITI 5FR MULTPACK ANG (CATHETERS) ×1 IMPLANT
GUIDEWIRE INQWIRE 1.5J.035X260 (WIRE) IMPLANT
INQWIRE 1.5J .035X260CM (WIRE)
KIT HEART LEFT (KITS) ×2 IMPLANT
PACK CARDIAC CATHETERIZATION (CUSTOM PROCEDURE TRAY) ×2 IMPLANT
SHEATH PINNACLE 5F 10CM (SHEATH) ×1 IMPLANT
SHEATH PROBE COVER 6X72 (BAG) ×1 IMPLANT
TRANSDUCER W/STOPCOCK (MISCELLANEOUS) ×2 IMPLANT
TUBING CIL FLEX 10 FLL-RA (TUBING) ×2 IMPLANT
WIRE EMERALD 3MM-J .035X150CM (WIRE) ×1 IMPLANT

## 2018-09-05 NOTE — Progress Notes (Signed)
PT Cancellation Note  Patient Details Name: Blake Santana MRN: [id number] DOB: 1971-04-20   Cancelled Treatment:    Reason Eval/Treat Not Completed: Patient at procedure or test/unavailable(cath lab)   Duncan Dull 09/05/2018, 3:19 PM

## 2018-09-05 NOTE — Progress Notes (Addendum)
Site area: RFA Site Prior to Removal:  Level 0 Pressure Applied For:20 min Manual:   yes Patient Status During Pull:  stable Post Pull Site:  Level 0 Post Pull Instructions Given:  Yes-interpreter present Post Pull Pulses Present: palpable Dressing Applied:  clear Bedrest begins @ 1520 till 1920 Comments: Marijo Sanes

## 2018-09-05 NOTE — H&P (View-Only) (Signed)
Progress Note  Patient Name: Blake Santana Date of Encounter: 09/05/2018  Primary Cardiologist: [name], MD   Subjective   No angina   Inpatient Medications    Scheduled Meds: . amLODipine  5 mg Oral Q breakfast  . aspirin EC  81 mg Oral Daily  . Chlorhexidine Gluconate Cloth  6 each Topical Q0600  . darbepoetin (ARANESP) injection - DIALYSIS  60 mcg Intravenous Q Sat-HD  . doxercalciferol  3 mcg Intravenous Q T,Th,Sa-HD  . feeding supplement (NEPRO CARB STEADY)  237 mL Oral BID BM  . gabapentin  300 mg Oral QHS  . insulin aspart  0-9 Units Subcutaneous TID WC  . metoprolol tartrate  12.5 mg Oral Once  . metoprolol tartrate  25 mg Oral BID WC  . multivitamin  1 tablet Oral QHS  . rosuvastatin  20 mg Oral q1800  . sodium chloride flush  3 mL Intravenous Q12H  . sodium chloride flush  3 mL Intravenous Q12H  . sucroferric oxyhydroxide  1,000 mg Oral TID WC   Continuous Infusions: . sodium chloride    . sodium chloride    . sodium chloride    . ceFEPime (MAXIPIME) IV 2 g (09/03/18 2127)  . heparin 1,650 Units/hr (09/04/18 2228)  . nitroGLYCERIN 7.5 mcg/min (09/03/18 0958)  . vancomycin 750 mg (09/03/18 1746)   PRN Meds: sodium chloride, sodium chloride, ondansetron (ZOFRAN) IV, sodium chloride flush, sodium chloride flush   Vital Signs    Vitals:   09/04/18 1419 09/04/18 2041 09/05/18 0423 09/05/18 0822  BP: 133/77 (!) 157/82 (!) 152/83 (!) 159/84  Pulse: 65 68 66   Resp:  14 18   Temp: 98.4 F (36.9 C) 98.3 F (36.8 C) (!) 97.5 F (36.4 C)   TempSrc: Oral Oral Oral   SpO2: 96% 99% 100%   Weight:   64.3 kg   Height:        Intake/Output Summary (Last 24 hours) at 09/05/2018 0840 Last data filed at 09/04/2018 1100 Gross per 24 hour  Intake 790.97 ml  Output -  Net 790.97 ml   Last 3 Weights 09/05/2018 09/04/2018 09/03/2018  Weight (lbs) 141 lb 12.8 oz 141 lb 4.8 oz 139 lb 5.3 oz  Weight (kg) 64.32 kg 64.093 kg 63.2 kg      Telemetry    Sinus  rhythm 09/05/2018 - Personally Reviewed  ECG    2.1 SR LAD LVH no acute ST changes   Physical Exam   GEN: Well nourished, well developed, in no acute distress  HEENT: normal  Neck: no JVD, carotid bruits, or masses Cardiac: RRR; no murmurs, rubs, or gallops,no edema  Respiratory:  clear to auscultation bilaterally, normal work of breathing GI: soft, nontender, nondistended, + BS MS: no deformity or atrophy  Skin: warm and dry Neuro:  Strength and sensation are intact Psych: euthymic mood, full affect    Labs    Chemistry Recent Labs  Lab 09/01/18 1216  09/03/18 1535 09/04/18 0446 09/05/18 0506  NA 136   < > 136 136 135  K 6.1*   < > 5.2* 4.3 4.4  CL 99   < > 97* 96* 93*  CO2 26   < > 26 27 24   GLUCOSE 184*   < > 246* 201* 159*  BUN 66*   < > 65* 37* 71*  CREATININE 9.99*   < > 9.19* 6.38* 9.25*  CALCIUM 9.1   < > 9.2 9.1 9.5  PROT 6.8  --   --   --   --  ALBUMIN 3.3*   < > 2.5* 2.6* 2.6*  AST 16  --   --   --   --   ALT 14  --   --   --   --   ALKPHOS 63  --   --   --   --   BILITOT 0.7  --   --   --   --   GFRNONAA 5*   < > 6* 9* 6*  GFRAA 6*   < > 7* 11* 7*  ANIONGAP 11   < > 13 13 18*   < > = values in this interval not displayed.     Hematology Recent Labs  Lab 09/03/18 0510 09/04/18 0446 09/05/18 0506  WBC 10.5 8.9 9.7  RBC 3.31* 3.31* 3.41*  HGB 9.8* 10.3* 10.4*  HCT 32.0* 31.7* 32.1*  MCV 96.7 95.8 94.1  MCH 29.6 31.1 30.5  MCHC 30.6 32.5 32.4  RDW 14.6 14.4 14.3  PLT 106* 122* 132*    Cardiac Enzymes Recent Labs  Lab 09/01/18 1516 09/01/18 1856 09/02/18 0219 09/03/18 1943  TROPONINI 1.91* 2.45* 2.22* 0.98*    Recent Labs  Lab 09/01/18 1226  TROPIPOC 0.65*     BNP Recent Labs  Lab 09/01/18 1856  BNP 4,237.8*     DDimer No results for input(s): DDIMER in the last 168 hours.   Radiology    No results found.  Cardiac Studies   TTE  1. The left ventricle has low normal systolic function of 41-66%. The cavity size is  normal. There is moderate left ventricular wall thickness. Echo evidence of Indeterminate diastolic filling patterns.  2. Normal left atrial size.  3. Normal right atrial size.  4. Trivial pericardial effusion, as described above.  5. The mitral valve Thickened. There is mild thickening. Regurgitation is mild by color flow Doppler.  6. Normal tricuspid valve.  7. Tricuspid regurgitation is mild.  8. The aortic valve tricuspid. There is mild thickening of the aortic valve.  9. No atrial level shunt detected by color flow Doppler.  Patient Profile     48 y.o. male with a hx of ESRD with AV graft, DM, HLD, HTN- not always controlled though at dialysis it runs low, and iron def. anemia who is being seen today for the evaluation of chest pain and syncope  Assessment & Plan    1.Chest pain/NSTEMI - chest pain elevated troponin ? SEMI per Dr Curt Bears cath today   The patient understands that risks include but are not limited to stroke (1 in 43), death (1 in 8), kidney failure [usually temporary] (1 in 500), bleeding (1 in 200), allergic reaction [possibly serious] (1 in 200), and agrees to proceed.  2.  Hyperlipidemia LDL less than 70.  Continue Crestor  3.  Diabetes Mellitus A1c 7.4.  Per primary team needs better contorl   4.  Pneumonia Currently afebrile on nasal cannula oxygen on vanc and maxipime ? Simplify needs f/u CXR in am   5.  ESRD on HD Volume management per nephrology  6.  Hyperkalemia Plan per nephrology improved today 4.4   7.  Hypertension Well-controlled.  No changes.  8.  PreSyncope Unclear as to the cause.  Continue evaluation with telemetry.benign so far      For questions or updates, please contact [facility] Please consult [email or web address] for contact info under        Signed, Jenkins Rouge, MD  09/05/2018, 8:40 AM

## 2018-09-05 NOTE — Progress Notes (Signed)
Pt states he understands about planned cardiac cath tomorrow, wants to have Interpreter present prior to signing consent. Jessie Foot, RN

## 2018-09-05 NOTE — Progress Notes (Signed)
Progress Note  Patient Name: Blake Santana Date of Encounter: 09/05/2018  Primary Cardiologist: [name], MD   Subjective   No angina   Inpatient Medications    Scheduled Meds: . amLODipine  5 mg Oral Q breakfast  . aspirin EC  81 mg Oral Daily  . Chlorhexidine Gluconate Cloth  6 each Topical Q0600  . darbepoetin (ARANESP) injection - DIALYSIS  60 mcg Intravenous Q Sat-HD  . doxercalciferol  3 mcg Intravenous Q T,Th,Sa-HD  . feeding supplement (NEPRO CARB STEADY)  237 mL Oral BID BM  . gabapentin  300 mg Oral QHS  . insulin aspart  0-9 Units Subcutaneous TID WC  . metoprolol tartrate  12.5 mg Oral Once  . metoprolol tartrate  25 mg Oral BID WC  . multivitamin  1 tablet Oral QHS  . rosuvastatin  20 mg Oral q1800  . sodium chloride flush  3 mL Intravenous Q12H  . sodium chloride flush  3 mL Intravenous Q12H  . sucroferric oxyhydroxide  1,000 mg Oral TID WC   Continuous Infusions: . sodium chloride    . sodium chloride    . sodium chloride    . ceFEPime (MAXIPIME) IV 2 g (09/03/18 2127)  . heparin 1,650 Units/hr (09/04/18 2228)  . nitroGLYCERIN 7.5 mcg/min (09/03/18 0958)  . vancomycin 750 mg (09/03/18 1746)   PRN Meds: sodium chloride, sodium chloride, ondansetron (ZOFRAN) IV, sodium chloride flush, sodium chloride flush   Vital Signs    Vitals:   09/04/18 1419 09/04/18 2041 09/05/18 0423 09/05/18 0822  BP: 133/77 (!) 157/82 (!) 152/83 (!) 159/84  Pulse: 65 68 66   Resp:  14 18   Temp: 98.4 F (36.9 C) 98.3 F (36.8 C) (!) 97.5 F (36.4 C)   TempSrc: Oral Oral Oral   SpO2: 96% 99% 100%   Weight:   64.3 kg   Height:        Intake/Output Summary (Last 24 hours) at 09/05/2018 0840 Last data filed at 09/04/2018 1100 Gross [place] 24 hour  Intake 790.97 ml  Output -  Net 790.97 ml   Last 3 Weights 09/05/2018 09/04/2018 09/03/2018  Weight (lbs) 141 lb 12.8 oz 141 lb 4.8 oz 139 lb 5.3 oz  Weight (kg) 64.32 kg 64.093 kg 63.2 kg      Telemetry    Sinus  rhythm 09/05/2018 - Personally Reviewed  ECG    2.1 SR LAD LVH no acute ST changes   Physical Exam   GEN: Well nourished, well developed, in no acute distress  HEENT: normal  Neck: no JVD, carotid bruits, or masses Cardiac: RRR; no murmurs, rubs, or gallops,no edema  Respiratory:  clear to auscultation bilaterally, normal work of breathing GI: soft, nontender, nondistended, + BS MS: no deformity or atrophy  Skin: warm and dry Neuro:  Strength and sensation are intact Psych: euthymic mood, full affect    Labs    Chemistry Recent Labs  Lab 09/01/18 1216  09/03/18 1535 09/04/18 0446 09/05/18 0506  NA 136   < > 136 136 135  K 6.1*   < > 5.2* 4.3 4.4  CL 99   < > 97* 96* 93*  CO2 26   < > 26 27 24   GLUCOSE 184*   < > 246* 201* 159*  BUN 66*   < > 65* 37* 71*  CREATININE 9.99*   < > 9.19* 6.38* 9.25*  CALCIUM 9.1   < > 9.2 9.1 9.5  PROT 6.8  --   --   --   --  ALBUMIN 3.3*   < > 2.5* 2.6* 2.6*  AST 16  --   --   --   --   ALT 14  --   --   --   --   ALKPHOS 63  --   --   --   --   BILITOT 0.7  --   --   --   --   GFRNONAA 5*   < > 6* 9* 6*  GFRAA 6*   < > 7* 11* 7*  ANIONGAP 11   < > 13 13 18*   < > = values in this interval not displayed.     Hematology Recent Labs  Lab 09/03/18 0510 09/04/18 0446 09/05/18 0506  WBC 10.5 8.9 9.7  RBC 3.31* 3.31* 3.41*  HGB 9.8* 10.3* 10.4*  HCT 32.0* 31.7* 32.1*  MCV 96.7 95.8 94.1  MCH 29.6 31.1 30.5  MCHC 30.6 32.5 32.4  RDW 14.6 14.4 14.3  PLT 106* 122* 132*    Cardiac Enzymes Recent Labs  Lab 09/01/18 1516 09/01/18 1856 09/02/18 0219 09/03/18 1943  TROPONINI 1.91* 2.45* 2.22* 0.98*    Recent Labs  Lab 09/01/18 1226  TROPIPOC 0.65*     BNP Recent Labs  Lab 09/01/18 1856  BNP 4,237.8*     DDimer No results for input(s): DDIMER in the last 168 hours.   Radiology    No results found.  Cardiac Studies   TTE  1. The left ventricle has low normal systolic function of 50-27%. The cavity size is  normal. There is moderate left ventricular wall thickness. Echo evidence of Indeterminate diastolic filling patterns.  2. Normal left atrial size.  3. Normal right atrial size.  4. Trivial pericardial effusion, as described above.  5. The mitral valve Thickened. There is mild thickening. Regurgitation is mild by color flow Doppler.  6. Normal tricuspid valve.  7. Tricuspid regurgitation is mild.  8. The aortic valve tricuspid. There is mild thickening of the aortic valve.  9. No atrial level shunt detected by color flow Doppler.  Patient Profile     48 y.o. male with a hx of ESRD with AV graft, DM, HLD, HTN- not always controlled though at dialysis it runs low, and iron def. anemia who is being seen today for the evaluation of chest pain and syncope  Assessment & Plan    1.Chest pain/NSTEMI - chest pain elevated troponin ? SEMI [facility] Dr Curt Bears cath today   The patient understands that risks include but are not limited to stroke (1 in 64), death (1 in 72), kidney failure [usually temporary] (1 in 500), bleeding (1 in 200), allergic reaction [possibly serious] (1 in 200), and agrees to proceed.  2.  Hyperlipidemia LDL less than 70.  Continue Crestor  3.  Diabetes Mellitus A1c 7.4.  [facility] primary team needs better contorl   4.  Pneumonia Currently afebrile on nasal cannula oxygen on vanc and maxipime ? Simplify needs f/u CXR in am   5.  ESRD on HD Volume management [facility] nephrology  6.  Hyperkalemia Plan [facility] nephrology improved today 4.4   7.  Hypertension Well-controlled.  No changes.  8.  PreSyncope Unclear as to the cause.  Continue evaluation with telemetry.benign so far      For questions or updates, please contact [place] Please consult [email or web address] for contact info under        Signed, Jenkins Rouge, MD  09/05/2018, 8:40 AM

## 2018-09-05 NOTE — Progress Notes (Signed)
ANTICOAGULATION CONSULT NOTE   Pharmacy Consult for Heparin Indication: chest pain/ACS/NSTEMI  No Known Allergies  Patient Measurements: Height: 5\' 2"  (157.5 cm) Weight: 141 lb 12.8 oz (64.3 kg) IBW/kg (Calculated) : 54.6 Heparin Dosing Weight: 71.9 kg  Vital Signs: Temp: 97.5 F (36.4 C) (02/03 0423) Temp Source: Oral (02/03 0423) BP: 159/84 (02/03 0822) Pulse Rate: 66 (02/03 0423)  Labs: Recent Labs    09/03/18 0510 09/03/18 1535 09/03/18 1943  09/04/18 0446 09/04/18 1604 09/05/18 0506  HGB 9.8*  --   --   --  10.3*  --  10.4*  HCT 32.0*  --   --   --  31.7*  --  32.1*  PLT 106*  --   --   --  122*  --  132*  HEPARINUNFRC 0.19*  --   --    < > 0.26* 0.39 0.46  CREATININE  --  9.19*  --   --  6.38*  --  9.25*  TROPONINI  --   --  0.98*  --   --   --   --    < > = values in this interval not displayed.    Medical History: Past Medical History:  Diagnosis Date  . Anemia of chronic kidney failure 03/11/2017  . Chest pain 09/01/2018  . Chronic kidney disease 03/11/2017  . Diabetes mellitus without complication ([place]) 4650  . Edema 01/14/2017  . Headache   . Hypercholesterolemia   . Hypertension 03/11/2017  . Iron deficiency anemia 06/18/2016  . RSV (respiratory syncytial virus infection) 08/2017   Assessment: 48 yr old male admitted with chest tightness and concern for NSTEMI.  Troponin up 1.19.  On IV heparin.  Heparin level is therapeutic this morning at 0.46, on 1650 units/hr. Hgb 10.4, plt 132. No s/sx of bleeding. No infusion issues.   Goal of Therapy:  Heparin level 0.3-0.7 units/ml Monitor platelets by anticoagulation protocol: Yes   Plan:  Continue heparin at 1650 units/hr Monitor daily HL, CBC, and for s/sx of bleeding  Thank you for allowing pharmacy to be a part of this patient's care.  Gwenlyn Found, Sherian Rein D PGY1 Pharmacy Resident  Phone [phone] 09/05/2018   9:20 AM

## 2018-09-05 NOTE — Progress Notes (Signed)
Subjective:  No sob, or cp  For Card cath today/ HD tomorrow on Schedule   Objective Vital signs in last 24 hours: Vitals:   09/04/18 1244 09/04/18 1419 09/04/18 2041 09/05/18 0423  BP: (!) 155/83 133/77 (!) 157/82 (!) 152/83  Pulse: 66 65 68 66  Resp:   14 18  Temp: 98.3 F (36.8 C) 98.4 F (36.9 C) 98.3 F (36.8 C) (!) 97.5 F (36.4 C)  TempSrc: Oral Oral Oral Oral  SpO2: 95% 96% 99% 100%  Weight:    64.3 kg  Height:       Weight change: -2.18 kg  Physical Exam: General: alert NAD  Heart: RRR, no m, r,g  Lungs: CTA bilat , unlabored breathing  Abdomen: Soft < NT, ND Extremities: no pedal edema  Dialysis Access: pos Bruit AVF   Dialysis Orders: TTS [facility] 3.75 hours EDW 64.5 2K 2 Ca heparin 2000 Hectorol 3 no ESA/Fe Recent labs: hgb 12.6 last Mircera 50 on 1/07 42% sat iPTH 279  Problem/Plan: 1. Acute hypoxic respiratory failure with fever: HCAP +/- volume excess. Flu negative. BCx no growth so far . ^ Pro-calcitonin and BNP.Remains on Vanc/Cefepime. 2. NSTEMI: + CP on admit, Trop peaked 2.45, Cardiology plans Card cath today On heparin/NTG drips. 3. Hyperkalemia:On admit, Am 4.4 resolved with HD. 4. ESRD: Usually TTS schedule. S/p HDovernight on admit d/t volume and ^ K. Now back to usual schedule, next 2/4. 5. Hypertension/volume: Variable weights , yest 64.4 ( edw 64.5)  euvolemia. 6. Anemiaof ESRD: Hgb10.3, continue Aranesp q Sat here. 7. Metabolic bone disease: Corr Ca/Phos fine. Continue binders (Velphoro) and VDRA. 8. Nutrition: Alb 2.6, continue Nepro supplements. 9. DM: On insulin, per primary.   Ernest Haber, PA-C [facility] [phone] 09/05/2018,8:09 AM  LOS: 4 days   Labs: Basic Metabolic Panel: Recent Labs  Lab 09/03/18 1535 09/04/18 0446 09/05/18 0506  NA 136 136 135  K 5.2* 4.3 4.4  CL 97* 96* 93*  CO2 26 27 24   GLUCOSE 246* 201* 159*  BUN 65* 37* 71*  CREATININE 9.19* 6.38* 9.25*  CALCIUM 9.2 9.1 9.5  PHOS  3.0 4.1 3.9   Liver Function Tests: Recent Labs  Lab 09/01/18 1216  09/03/18 1535 09/04/18 0446 09/05/18 0506  AST 16  --   --   --   --   ALT 14  --   --   --   --   ALKPHOS 63  --   --   --   --   BILITOT 0.7  --   --   --   --   PROT 6.8  --   --   --   --   ALBUMIN 3.3*   < > 2.5* 2.6* 2.6*   < > = values in this interval not displayed.   No results for input(s): LIPASE, AMYLASE in the last 168 hours. No results for input(s): AMMONIA in the last 168 hours. CBC: Recent Labs  Lab 09/01/18 1216 09/01/18 1856 09/02/18 0219 09/03/18 0510 09/04/18 0446 09/05/18 0506  WBC 11.8* 12.5* 14.3* 10.5 8.9 9.7  NEUTROABS 9.2*  --   --   --   --   --   HGB 10.4* 10.9* 10.3* 9.8* 10.3* 10.4*  HCT 33.5* 34.1* 32.8* 32.0* 31.7* 32.1*  MCV 96.5 96.3 95.9 96.7 95.8 94.1  PLT 139* 141* 123* 106* 122* 132*   Cardiac Enzymes: Recent Labs  Lab 09/01/18 1516 09/01/18 1856 09/02/18 0219 09/03/18 [place]  1.91* 2.45* 2.22* 0.98*   CBG: Recent Labs  Lab 09/04/18 0746 09/04/18 1121 09/04/18 1613 09/04/18 2039 09/05/18 0731  GLUCAP 156* 212* 222* 174* 155*    Studies/Results: No results found. Medications: . sodium chloride    . sodium chloride    . sodium chloride    . ceFEPime (MAXIPIME) IV 2 g (09/03/18 2127)  . heparin 1,650 Units/hr (09/04/18 2228)  . nitroGLYCERIN 7.5 mcg/min (09/03/18 0958)  . vancomycin 750 mg (09/03/18 1746)   . amLODipine  5 mg Oral Q breakfast  . aspirin EC  81 mg Oral Daily  . Chlorhexidine Gluconate Cloth  6 each Topical Q0600  . darbepoetin (ARANESP) injection - DIALYSIS  60 mcg Intravenous Q Sat-HD  . doxercalciferol  3 mcg Intravenous Q T,Th,Sa-HD  . feeding supplement (NEPRO CARB STEADY)  237 mL Oral BID BM  . gabapentin  300 mg Oral QHS  . insulin aspart  0-9 Units Subcutaneous TID WC  . metoprolol tartrate  12.5 mg Oral Once  . metoprolol tartrate  25 mg Oral BID WC  . multivitamin  1 tablet Oral QHS  . rosuvastatin  20 mg  Oral q1800  . sodium chloride flush  3 mL Intravenous Q12H  . sodium chloride flush  3 mL Intravenous Q12H  . sucroferric oxyhydroxide  1,000 mg Oral TID WC

## 2018-09-05 NOTE — Progress Notes (Addendum)
Family Medicine Teaching Service Daily Progress Note Intern Pager: [phone]  Patient name: Blake Santana Medical record number: [id number] Date of birth: [birth year] Age: 48 y.o. Gender: male  Primary Care Provider: Mack Hook, MD Consultants: Cardiology, Nephrology Code Status: Full  Pt Overview and Major Events to Date:  1/30 admitted to [place], heparin dtt, Vanc/Cefepime x1 (1/30) in ED for HCAP, Azithro/CTX (1/30-) for CAP, K+ 6.1 s/p D5/insulin/calcium in ED 1/31: febrile to t-max 103.3, HD, d/c Azithro/CTX (1/30-1/31), begin Vanc/Cefepime (1/31-)  Assessment and Plan: [name] a 48 y.o.malepresenting with atypical chest pain, weakness, orthopnea, and fever. PMH is significant forESRD with AV graft, T2DM, HLD, HTN, and iron deficiency anemia.  NSTEMI:  Chest pain improved this morning. Has been well controlled with nitro drip without need for PRN. Left heart cath today.  - Follow up left heart cath today and cards recs - Continue ASA 81mg , Crestor 20mg , Lopressor 25mg   - PRN nitro - EKG prn for chest pain  Acute Hypoxic Respiratory Failure 2/2 CAP +/- Hypervolemia: Improving Improving clinically. Stable on 2L O2 ON.  Has remained afebrile >48 hours without return of leukocytosis. Blood culture neg x 4 days. Currently on Vanc and Cefepime.  - Discontinue Vanc and Cefepime  (1/31-2/3), transition to Levaquin to complete a 7 day course - wean O2 as tolerated  HFpEF:  Echo on 09/02/2018 with low to normal ejection fraction of 50 to 55%.   ESRDon HD TTS: Stable Next HD 2/4. Stable electrolytes. - HD per nephrology   T2DM Peripheral Neuropathy: Last A1C 7.4 on 08/26/18. Home meds: Glipizide 5mg  QD and Gabapentin 300mg  qHS. CBG ON: 150-200. - Hold home Glipizide - sSSI - monitor CBGs - continue home gabapentin  HLD: stable - Continue Crestor 20mg    HTN: BP elevated ON (130-150's SBP). Home meds: Amlodipine 5mg  QD, Lopressor 12.5mg  BID. Per  cards, increase home Metoprolol to 25mg  BID - Continue Lopressor 25mg , Amlodipine 5mg  - monitor vitals  H/o Iron Deficiency Anemia: stable at baseline (10-11) - continue to monitor - ESA per nephro  FEN/GI: Renal Diet, Zofran PRN PPx: Heparin drip  Disposition: Pending [facility] and cards recs  Subjective:  Patient denies any chest pain this morning. Does not have any complaints. Notes some tremors of his LE's that is concerning.  Told him lets see how his procedure goes today and talk further evaluate the tremors after. He agreed.  Objective: Temp:  [97.5 F (36.4 C)-98.4 F (36.9 C)] 97.5 F (36.4 C) (02/03 0423) Pulse Rate:  [65-68] 66 (02/03 0423) Resp:  [14-18] 18 (02/03 0423) BP: (133-159)/(77-84) 159/84 (02/03 0822) SpO2:  [95 %-100 %] 100 % (02/03 0423) Weight:  [64.3 kg] 64.3 kg (02/03 0423) Physical Exam: General: well nourished, well developed, in no acute distress with non-toxic appearance, lying comfortably in bed with nasal canula in place HEENT: normocephalic, atraumatic, moist mucous membranes CV: regular rate and rhythm without murmurs, rubs, or gallops, no lower extremity edema, no LE edema Lungs: crackles in lower lobes bilaterally, unchanged from day prior, otherwise clear with normal work of breathing Abdomen: soft, non-tender, non-distended, normoactive bowel sounds Skin: warm, dry, no rashes or lesions Extremities: warm and well perfused, normal tone Neuro: Alert and oriented, speech normal  Laboratory: Recent Labs  Lab 09/03/18 0510 09/04/18 0446 09/05/18 0506  WBC 10.5 8.9 9.7  HGB 9.8* 10.3* 10.4*  HCT 32.0* 31.7* 32.1*  PLT 106* 122* 132*   Recent Labs  Lab 09/01/18 1216  09/03/18 1535 09/04/18 0446  09/05/18 0506  NA 136   < > 136 136 135  K 6.1*   < > 5.2* 4.3 4.4  CL 99   < > 97* 96* 93*  CO2 26   < > 26 27 24   BUN 66*   < > 65* 37* 71*  CREATININE 9.99*   < > 9.19* 6.38* 9.25*  CALCIUM 9.1   < > 9.2 9.1 9.5  PROT 6.8  --   --   --    --   BILITOT 0.7  --   --   --   --   ALKPHOS 63  --   --   --   --   ALT 14  --   --   --   --   AST 16  --   --   --   --   GLUCOSE 184*   < > 246* 201* 159*   < > = values in this interval not displayed.    EKG (1/30): NSR with lateral T wave depression, more pronounced than last year  EKG (1/31): NSR with nonspecific ST changes EKG (2/1): NSR, no lateral T wave depression appreciated Troponin: 0.65 Na 136, K 6.1>5.4>6.9>4.7>5.2>4.3, BUN 66, Cr 9.99, Mag 2.5 WBC 11.8>12.5>14.3>10.5>8.9, Hgb 10.4>10.9>10.3, Plts 094>076>808 Flu: negative Magnesium: 2.5 (h) Lactic acid: 1.1>1.5 Blood culture: pending I-stat troponin: 0.65 Troponin: 1.91>2.45>2.22 TSH 1.174 BNP 4237.8 ABG: pH 7.438, pCO2 65.6, bicarb 28.1 procalcitonin 3.72>55.08>69.66 PT/INR: 15.6/1.26 Blood cx x 2: NGTD  Imaging/Diagnostic Tests: No results found.  Mina Marble [place], DO 09/05/2018, 9:45 AM PGY-1, [place] Intern pager: [phone], text pages welcome

## 2018-09-05 NOTE — Interval H&P Note (Signed)
Cath Lab Visit (complete for each Cath Lab visit)  Clinical Evaluation Leading to the Procedure:   ACS: Yes.    Non-ACS:    Anginal Classification: CCS IV  Anti-ischemic medical therapy: Minimal Therapy (1 class of medications)  Non-Invasive Test Results: No non-invasive testing performed  Prior CABG: No previous CABG      History and Physical Interval Note:  09/05/2018 2:20 PM  Blake Santana  has presented today for surgery, with the diagnosis of NSTEMI  The various methods of treatment have been discussed with the patient and family. After consideration of risks, benefits and other options for treatment, the patient has consented to  Procedure(s): LEFT HEART CATH AND CORONARY ANGIOGRAPHY (N/A) as a surgical intervention .  The patient's history has been reviewed, patient examined, no change in status, stable for surgery.  I have reviewed the patient's chart and labs.  Questions were answered to the patient's satisfaction.     Larae Grooms

## 2018-09-06 ENCOUNTER — Inpatient Hospital Stay (HOSPITAL_COMMUNITY): Payer: Medicaid Other

## 2018-09-06 ENCOUNTER — Encounter (HOSPITAL_COMMUNITY): Payer: Self-pay | Admitting: Interventional Cardiology

## 2018-09-06 LAB — CULTURE, BLOOD (ROUTINE X 2)
Culture: NO GROWTH
Culture: NO GROWTH

## 2018-09-06 LAB — BASIC METABOLIC PANEL
Anion gap: 16 — ABNORMAL HIGH (ref 5–15)
BUN: 101 mg/dL — ABNORMAL HIGH (ref 6–20)
CO2: 24 mmol/L (ref 22–32)
Calcium: 9.6 mg/dL (ref 8.9–10.3)
Chloride: 94 mmol/L — ABNORMAL LOW (ref 98–111)
Creatinine, Ser: 11.56 mg/dL — ABNORMAL HIGH (ref 0.61–1.24)
GFR calc non Af Amer: 5 mL/min — ABNORMAL LOW (ref >60)
GFR, EST AFRICAN AMERICAN: 5 mL/min — AB (ref >60)
Glucose, Bld: 191 mg/dL — ABNORMAL HIGH (ref 70–99)
Potassium: 4.5 mmol/L (ref 3.5–5.1)
SODIUM: 134 mmol/L — AB (ref 135–145)

## 2018-09-06 LAB — CBC
HCT: 31.5 % — ABNORMAL LOW (ref 39.0–52.0)
HCT: 32.6 % — ABNORMAL LOW (ref 39.0–52.0)
Hemoglobin: 10.5 g/dL — ABNORMAL LOW (ref 13.0–17.0)
Hemoglobin: 10.5 g/dL — ABNORMAL LOW (ref 13.0–17.0)
MCH: 30.9 pg (ref 26.0–34.0)
MCH: 30.1 pg (ref 26.0–34.0)
MCHC: 33.3 g/dL (ref 30.0–36.0)
MCHC: 32.2 g/dL (ref 30.0–36.0)
MCV: 92.6 fL (ref 80.0–100.0)
MCV: 93.4 fL (ref 80.0–100.0)
NRBC: 0.4 % — AB (ref 0.0–0.2)
Platelets: 163 10*3/uL (ref 150–400)
Platelets: 153 10*3/uL (ref 150–400)
RBC: 3.4 MIL/uL — ABNORMAL LOW (ref 4.22–5.81)
RBC: 3.49 MIL/uL — ABNORMAL LOW (ref 4.22–5.81)
RDW: 14.4 % (ref 11.5–15.5)
RDW: 14.2 % (ref 11.5–15.5)
WBC: 8.4 10*3/uL (ref 4.0–10.5)
WBC: 9.2 10*3/uL (ref 4.0–10.5)
nRBC: 0.4 % — ABNORMAL HIGH (ref 0.0–0.2)

## 2018-09-06 LAB — GLUCOSE, CAPILLARY
Glucose-Capillary: 151 mg/dL — ABNORMAL HIGH (ref 70–99)
Glucose-Capillary: 145 mg/dL — ABNORMAL HIGH (ref 70–99)
Glucose-Capillary: 248 mg/dL — ABNORMAL HIGH (ref 70–99)
Glucose-Capillary: 295 mg/dL — ABNORMAL HIGH (ref 70–99)

## 2018-09-06 LAB — VANCOMYCIN, RANDOM: Vancomycin Rm: 26

## 2018-09-06 LAB — TROPONIN I: Troponin I: 0.69 ng/mL (ref <0.03)

## 2018-09-06 MED ORDER — PANTOPRAZOLE SODIUM 40 MG IV SOLR: 40.0000 mg | Freq: Two times a day (BID) | INTRAVENOUS | Status: DC

## 2018-09-06 MED ORDER — PANTOPRAZOLE SODIUM 40 MG IV SOLR
40.0000 mg | INTRAVENOUS | Status: DC
  Administered 2018-09-06 – 2018-09-07 (×2): 40 mg via INTRAVENOUS
  Filled 2018-09-06 (×2): qty 40

## 2018-09-06 MED ORDER — SODIUM CHLORIDE 0.9 % IV BOLUS: 1000.0000 mL | Freq: Once | INTRAVENOUS | Status: DC

## 2018-09-06 NOTE — Progress Notes (Signed)
Subjective:  Noted Card cath yest,=  No Significant CAD. Seen on HD this am and initially no cos  As HD started . About  1 hr into hd had emesis and brief unresponsive  /Hd continued with uf off then second  Episode and HD stopped  With 1.5 hr left  .   ate breakfast before hd ?? etiology of emesis   bs = 151  Objective Vital signs in last 24 hours: Vitals:   09/05/18 2038 09/05/18 2315 09/06/18 0312 09/06/18 0437  BP: 135/69  (!) 153/79   Pulse: 64 64 61   Resp: 20     Temp: 98.4 F (36.9 C)   97.8 F (36.6 C)  TempSrc: Oral   Oral  SpO2: 99% 99% 96%   Weight:    66.2 kg  Height:       Weight change: 1.86 kg  Physical Exam: before emesis General: On HD alert no cos Heart: RRR Lungs: CTA Bilat  Abdomen: BS pos soft NT, ND Extremities: no pedal edema  Dialysis Access: AVF patent on HD   OP Dialysis Orders: TTS [facility] 3.75 hours EDW 64.5 2K 2 Ca heparin 2000 Hectorol 3 no ESA/Fe Recent labs: hgb 12.6 last Mircera 50 on 1/07 42% sat iPTH 279  Problem/Plan: 1. Acute hypoxic respiratory failure with fever: HCAP +/- volume excess. Flu negative. BCx no growth so far . ^ Pro-calcitonin and BNP. 2. NSTEMI:Card Cath 09/05/18  ( no significant CAD )+ CP on admit, Trop peaked 2.45, Cardiology signed off . 3. Hyperkalemia:On admit, Am 4.5resolved with HD. 4. ESRD: Usually TTS schedule. S/p HDovernight on admit d/t volume and ^ K. Now back to usual schedule, next 2/4. 5. Hypertension/volume: Variable weights ,yest 64.4 ( edw 64.5)  CXR 204 am "persistent bilat lower low PNA"  bp 153/79 early am pre hd/ planned to  Attempt 3l uf today but with  emesis  And bp dropped 87/53 so uf off  / Meds = Amlodipine 5mg  am make hs  / Metop 25 mg bid   6. Anemiaof ESRD: Hgb10.3, continue Aranesp q Sat here. 7. Metabolic bone disease: Corr Ca/Phos fine. Continue binders (Velphoro) and VDRA. 8. Nutrition: Alb 2.6, continue Nepro supplements. 9. DM: On insulin, per primary   Ernest Haber,  PA-C [place] [phone] 09/06/2018,8:12 AM  LOS: 5 days   Labs: Basic Metabolic Panel: Recent Labs  Lab 09/03/18 1535 09/04/18 0446 09/05/18 0506 09/06/18 0433  NA 136 136 135 134*  K 5.2* 4.3 4.4 4.5  CL 97* 96* 93* 94*  CO2 26 27 24 24   GLUCOSE 246* 201* 159* 191*  BUN 65* 37* 71* 101*  CREATININE 9.19* 6.38* 9.25* 11.56*  CALCIUM 9.2 9.1 9.5 9.6  PHOS 3.0 4.1 3.9  --    Liver Function Tests: Recent Labs  Lab 09/01/18 1216  09/03/18 1535 09/04/18 0446 09/05/18 0506  AST 16  --   --   --   --   ALT 14  --   --   --   --   ALKPHOS 63  --   --   --   --   BILITOT 0.7  --   --   --   --   PROT 6.8  --   --   --   --   ALBUMIN 3.3*   < > 2.5* 2.6* 2.6*   < > = values in this interval not displayed.   No results for input(s): LIPASE, AMYLASE in  the last 168 hours. No results for input(s): AMMONIA in the last 168 hours. CBC: Recent Labs  Lab 09/01/18 1216  09/02/18 0219 09/03/18 0510 09/04/18 0446 09/05/18 0506 09/06/18 0433  WBC 11.8*   < > 14.3* 10.5 8.9 9.7 8.4  NEUTROABS 9.2*  --   --   --   --   --   --   HGB 10.4*   < > 10.3* 9.8* 10.3* 10.4* 10.5*  HCT 33.5*   < > 32.8* 32.0* 31.7* 32.1* 31.5*  MCV 96.5   < > 95.9 96.7 95.8 94.1 92.6  PLT 139*   < > 123* 106* 122* 132* 163   < > = values in this interval not displayed.   Cardiac Enzymes: Recent Labs  Lab 09/01/18 1516 09/01/18 1856 09/02/18 0219 09/03/18 1943  TROPONINI 1.91* 2.45* 2.22* 0.98*   CBG: Recent Labs  Lab 09/05/18 0731 09/05/18 1124 09/05/18 1451 09/05/18 1609 09/05/18 2037  GLUCAP 155* 142* 121* 139* 231*    Studies/Results: Dg Chest 2 View  Result Date: 09/06/2018 CLINICAL DATA:  Follow-up pneumonia EXAM: CHEST - 2 VIEW COMPARISON:  09/01/2018 FINDINGS: Persistent patchy bilateral lower lobe pneumonia and volume loss. Similar appearance allowing for technical factors. Upper lobes remain largely clear. No visible effusion. IMPRESSION: Persistent  bilateral lower lobe pneumonia. Electronically Signed   By: Nelson Chimes M.D.   On: 09/06/2018 07:37   Medications: . sodium chloride    . sodium chloride    . nitroGLYCERIN 20 mcg/min (09/06/18 0539)   . amLODipine  5 mg Oral Q breakfast  . aspirin EC  81 mg Oral Daily  . Chlorhexidine Gluconate Cloth  6 each Topical Q0600  . darbepoetin (ARANESP) injection - DIALYSIS  60 mcg Intravenous Q Sat-HD  . doxercalciferol  3 mcg Intravenous Q T,Th,Sa-HD  . feeding supplement (NEPRO CARB STEADY)  237 mL Oral BID BM  . gabapentin  300 mg Oral QHS  . insulin aspart  0-9 Units Subcutaneous TID WC  . [START ON 09/07/2018] levofloxacin  500 mg Oral Q48H  . metoprolol tartrate  12.5 mg Oral Once  . metoprolol tartrate  25 mg Oral BID WC  . multivitamin  1 tablet Oral QHS  . rosuvastatin  20 mg Oral q1800  . sodium chloride flush  3 mL Intravenous Q12H  . sodium chloride flush  3 mL Intravenous Q12H  . sucroferric oxyhydroxide  1,000 mg Oral TID WC

## 2018-09-06 NOTE — Progress Notes (Addendum)
FPTS Interim Progress Note  S:Got a page at 10:30am with reports of "coffee ground emesis.". Went to see patient and vomit was brown appearing with remnants of previous eaten food. Vitals stable at that time with BP 98/61>100/55, HR 64, 100% O2 on RA. Patient A&Ox3 without any abdominal pain, some nausea. Denies melena, hematochezia, or bright red blood in vomit. Denies CP or SOB.   Received another page at ~1100 with reports of "syncopal episode" and another episode of vomit that was brown and consisted of food. Vitals stable at BP: 138/51, P 70, 100% O2 sats on RA, CBG 151. HD was stopped. Patient notes all he remember is feeling nauseaous then woke up and "had vomit all over him" and the nurses were cleaning him. Denies headache, chest pain, light headedness, dizziness, palpitations, or SOB prior or after episode. Denies any symptoms except for some mild nausea at this time. Per nurse, patient was staring blankly all of a sudden, began shaking patient, lasted ~15 seconds, then patient came to.   Only concerning symptom to him now is his "tremors". Per patient, these tremors have been present for years but have gotten worse since last Thursday. Feels like his voice is more shaky as well. Tremors are present in UE and LE, constant and visible at rest. Per chart review, recently started Gabapentin in August 2019.   O: BP (!) 153/79   Pulse 61   Temp 97.8 F (36.6 C) (Oral)   Resp 20   Ht 5\' 2"  (1.575 m)   Wt 66.2 kg   SpO2 96%   BMI 26.69 kg/m   General: sitting comfortably in HD bed with eyes closed HEENT: normocephalic, atraumatic, moist mucous membranes, conjunctiva pink CV: regular rate and rhythm without murmurs, rubs, or gallops, 2+ radial pulses bilaterally,  Abdomen: soft, non-tender, non-distended, normoactive bowel sounds Extremities: warm and well perfused, normal tone Neuro: Alert and oriented, speech normal, small tremors present at rest in hands bilaterally, more pronounced at  times, pupils nonreactive to light bilaterally   A/P: Vomiting/Syncope: - obtain CBC, EKG, tropx1 - Start Protonix - monitor vitals - stop Nitro drip - Will talk to cards due to concern of syncope   Danna Hefty, DO 09/06/2018, 11:00 AM PGY-1, [facility] pager [phone]

## 2018-09-06 NOTE — Significant Event (Signed)
Rapid Response Event Note RN called for possible aspiration and unresponsiveness  Overview: Time Called: 0956 Arrival Time: 1000 Event Type: Neurologic  Initial Focused Assessment: On arrival pt sitting upright in bed, staff at bedside assisting with suctioning pt, coffee ground emesis to upper chest and blankets.  Pt alert and oriented x4, reports some abd pain.   1040 update  RN called me back to HD for anotheer syncopal episode followed by vomiting. Light brown colored emesis. Pt a/o x4. MD paged   Interventions: Paged Dr. Valere Dross, new orders for portable CXR and CBC  Plan of Care (if not transferred): Continue to monitor call RRT as needed.  Event Summary: Name of Physician Notified: Dr. Tarry Kos  at 1010    at    Outcome: Stayed in room and stabalized     [place], [place]

## 2018-09-06 NOTE — Progress Notes (Signed)
PT Cancellation Note  Patient Details Name: Blake Santana MRN: [id number] DOB: 19-Nov-1970   Cancelled Treatment:    Reason Eval/Treat Not Completed: Patient at procedure or test/unavailable (HD). Will follow-up for PT evaluation as schedule permits.  Mabeline Caras, PT, DPT Acute Rehabilitation Services  Pager [phone] Office [place] 09/06/2018, 8:18 AM

## 2018-09-06 NOTE — Progress Notes (Signed)
Family Medicine Teaching Service Daily Progress Note Intern Pager: [phone]  Patient name: Blake Santana Medical record number: [id number] Date of birth: 1971/05/05 Age: 48 y.o. Gender: male  Primary Care Provider: Mack Hook, MD Consultants: Cardiology, Nephrology Code Status: Full  Pt Overview and Major Events to Date:  1/30 admitted to [place], heparin dtt, Vanc/Cefepime x1 (1/30) in ED for HCAP, Azithro/CTX (1/30-) for CAP, K+ 6.1 s/p D5/insulin/calcium in ED 1/31: febrile to t-max 103.3, HD, d/c Azithro/CTX (1/30-1/31), begin Vanc/Cefepime (1/31-) 2/3: Left heart cath: 10% stenosis of LAD, otherwise clear without intervention; Discontinue Vanc/Cefepime (1/31-2/3), start Levaquin (2/3-2/5)  Assessment and Plan: Blake Manson Ocejois a 48 y.o.malepresenting with atypical chest pain, weakness, orthopnea, and fever. PMH is significant forESRD with AV graft, T2DM, HLD, HTN, and iron deficiency anemia.  Syncopal Episode / Vomiting:   See prior progress note of details concerning events. Syncopal episode and vomiting while supine in HD. Concerning for cardiac origin given history, although normal heart cath is reassuring. Arrhythmia is possible. May be vasovagal secondary to HD as patient was in HD at the time. Vitals stable and normotensive. CBG WNL thus not 2/2 to hypoglycemia.  Neuro component is possible, however patient is currently alert and oriented without any focal deficits. Cards curbsided and do not feel cardiac in origin. At this time, will follow up ordered labs and continue to monitor patient. - Follow up EKG, Troponins x1, CBC - Nitro drip discontinued - consider neuro work up if symptoms recur  NSTEMI:  LHC on 2/3 without complications and was unremarkable with mild stenosis of 10% of LAD. Continues to deny chest pain.   - Per cards, no further cardiac work up needed - Continue ASA 81mg , Crestor 20mg , Lopressor 25mg   - PRN nitro - stop nitro drip given  syncopal episode - follow up labs as above  Acute Hypoxic Respiratory Failure 2/2 CAP +/- Hypervolemia: Improving Continues to improve clinically, stable on room air.  Has continued to remain afebrile without return of leukocytosis. Blood culture neg to date. Repeat CXR with persistent bilateral PNA. Transitioned to Levaquin yesterday to complete course tomorrow.   - wean O2 as tolerated - Continue Levaquin (1/30-2/5)  HFpEF: stable Echo on 09/02/2018 with low to normal ejection fraction of 50 to 55%.   ESRDon HD TTS: Stable Stable electrolytes. Only received part of HD given syncope/vomiting. - HD per nephrology   T2DM Peripheral Neuropathy: Last A1C 7.4 on 08/26/18. Home meds: Glipizide 5mg  QD and Gabapentin 300mg  qHS. CBG ON: 150-230. Per chart review, was started on Gabapentin in August 2019, 300mg  QD. TSH WNL - Hold home Glipizide - sSSI - monitor CBGs - continue home gabapentin - Consider decreasing/stoping Gabapentin given tremors   HLD: stable - Continue Crestor 20mg    HTN: BP ON (130-150's SBP). Home meds: Amlodipine 5mg  QD, Lopressor 12.5mg  BID. Lopressor increased to 25mg  BID during admission. - Continue Lopressor 25mg  BID, Amlodipine 5mg  - monitor vitals  H/o Iron Deficiency Anemia: stable at baseline (10-11) - continue to monitor - ESA per nephro  FEN/GI: Renal Diet, Zofran PRN PPx: Heparin drip  Disposition: Monitor ON given new symptoms, likely home tomorrow if remains stable  Subjective:  Patient notes doing well overnight and feels well this morning. Denies any chest discomfort, SOB, abdominal pain, headache, or dizziness. Productive cough has improved, sputum is clear now. Still notes tremors that have been present for years and seem to have gotten worse since he got sick.   Objective: Temp:  [97.8 F (  36.6 C)-98.4 F (36.9 C)] (P) 98 F (36.7 C) (02/04 1039) Pulse Rate:  [0-81] 81 (02/04 1039) Resp:  [0-86] 24 (02/04 1039) BP: (135-183)/(69-97)  138/87 (02/04 1039) SpO2:  [0 %-99 %] 96 % (02/04 1039) Weight:  [66.2 kg] 66.2 kg (02/04 0437) Physical Exam: General: well nourished, well developed, in no acute distress with non-toxic appearance, lying comfortably in HD about to eat breakfast HEENT: normocephalic, atraumatic, moist mucous membranes CV: regular rate and rhythm without murmurs, rubs, or gallops, no LE edema, 2+ radial and pedal pulses bilaterally Lungs: crackles in lower lobes bilaterally L>R, otherwise clear with normal work of breathing Abdomen: soft, non-tender, non-distended, normoactive bowel sounds Skin: warm, dry, no rashes or lesions Extremities: warm and well perfused, normal tone Neuro: Alert and oriented, speech normal  Laboratory: Recent Labs  Lab 09/05/18 0506 09/06/18 0433 09/06/18 1030  WBC 9.7 8.4 9.2  HGB 10.4* 10.5* 10.5*  HCT 32.1* 31.5* 32.6*  PLT 132* 163 153   Recent Labs  Lab 09/01/18 1216  09/04/18 0446 09/05/18 0506 09/06/18 0433  NA 136   < > 136 135 134*  K 6.1*   < > 4.3 4.4 4.5  CL 99   < > 96* 93* 94*  CO2 26   < > 27 24 24   BUN 66*   < > 37* 71* 101*  CREATININE 9.99*   < > 6.38* 9.25* 11.56*  CALCIUM 9.1   < > 9.1 9.5 9.6  PROT 6.8  --   --   --   --   BILITOT 0.7  --   --   --   --   ALKPHOS 63  --   --   --   --   ALT 14  --   --   --   --   AST 16  --   --   --   --   GLUCOSE 184*   < > 201* 159* 191*   < > = values in this interval not displayed.    EKG (1/30): NSR with lateral T wave depression, more pronounced than last year  EKG (1/31): NSR with nonspecific ST changes EKG (2/1): NSR, no lateral T wave depression appreciated Troponin: 0.65 Na 136, K 6.1>5.4>6.9>4.7>5.2>4.3, BUN 66, Cr 9.99, Mag 2.5 WBC 11.8>12.5>14.3>10.5>8.9, Hgb 10.4>10.9>10.3, Plts 856>314>970 Flu: negative Magnesium: 2.5 (h) Lactic acid: 1.1>1.5 Blood culture: pending I-stat troponin: 0.65 Troponin: 1.91>2.45>2.22 TSH 1.174 BNP 4237.8 ABG: pH 7.438, pCO2 65.6, bicarb  28.1 procalcitonin 3.72>55.08>69.66 PT/INR: 15.6/1.26 Blood cx x 2: NGTD Repeat troponin: 0.69   CXR (09/01/18): FINDINGS: There is left worse than right basilar airspace disease. Cardiomegaly and vascular congestion noted. No pneumothorax or pleural effusion. No acute or focal bony abnormality. IMPRESSION: Left worse than right basilar airspace disease worrisome for Pneumonia. Cardiomegaly and pulmonary vascular congestion.  Echo 09/02/18: IMPRESSIONS  1. The left ventricle has low normal systolic function of 26-37%. The cavity size is normal. There is moderate left ventricular wall thickness. Echo evidence of Indeterminate diastolic filling patterns.  2. Normal left atrial size.  3. Normal right atrial size.  4. Trivial pericardial effusion, as described above.  5. The mitral valve Thickened. There is mild thickening. Regurgitation is mild by color flow Doppler.  6. Normal tricuspid valve.  7. Tricuspid regurgitation is mild.  8. The aortic valve tricuspid. There is mild thickening of the aortic valve.  9. No atrial level shunt detected by color flow Doppler.  Left Heart Cath 09/05/18:  Mid LAD lesion is 10% stenosed.  Ost 2nd Diag to 2nd Diag lesion is 10% stenosed.  LV end diastolic pressure is mildly elevated. LVEDP 18 mm Hg.  There is no aortic valve stenosis.  Imaging/Diagnostic Tests: Dg Chest 2 View  Result Date: 09/06/2018 CLINICAL DATA:  Follow-up pneumonia EXAM: CHEST - 2 VIEW COMPARISON:  09/01/2018 FINDINGS: Persistent patchy bilateral lower lobe pneumonia and volume loss. Similar appearance allowing for technical factors. Upper lobes remain largely clear. No visible effusion. IMPRESSION: Persistent bilateral lower lobe pneumonia. Electronically Signed   By: Nelson Chimes M.D.   On: 09/06/2018 07:37    Danna Hefty, DO 09/06/2018, 12:44 PM PGY-1, [place] Intern pager: [phone], text pages welcome

## 2018-09-06 NOTE — Progress Notes (Signed)
PT Cancellation Note  Patient Details Name: Camdyn Beske MRN: [id number] DOB: [birth year]   Cancelled Treatment:    Reason Eval/Treat Not Completed: PT screened, no needs identified, will sign off. Per nursing, pt independent with mobility. Acute PT will sign off. Please re-consult if new needs arise.  Mabeline Caras, PT, DPT Acute Rehabilitation Services  Pager [phone] Office [place] 09/06/2018, 3:33 PM

## 2018-09-06 NOTE — Progress Notes (Signed)
Progress Note  Patient Name: Blake Santana Date of Encounter: 09/06/2018  Primary Cardiologist: [name], MD   Subjective   No chest pain, no SOB on dialysis  Inpatient Medications    Scheduled Meds: . amLODipine  5 mg Oral Q breakfast  . aspirin EC  81 mg Oral Daily  . Chlorhexidine Gluconate Cloth  6 each Topical Q0600  . darbepoetin (ARANESP) injection - DIALYSIS  60 mcg Intravenous Q Sat-HD  . doxercalciferol  3 mcg Intravenous Q T,Th,Sa-HD  . feeding supplement (NEPRO CARB STEADY)  237 mL Oral BID BM  . gabapentin  300 mg Oral QHS  . insulin aspart  0-9 Units Subcutaneous TID WC  . [START ON 09/07/2018] levofloxacin  500 mg Oral Q48H  . metoprolol tartrate  12.5 mg Oral Once  . metoprolol tartrate  25 mg Oral BID WC  . multivitamin  1 tablet Oral QHS  . rosuvastatin  20 mg Oral q1800  . sodium chloride flush  3 mL Intravenous Q12H  . sodium chloride flush  3 mL Intravenous Q12H  . sucroferric oxyhydroxide  1,000 mg Oral TID WC   Continuous Infusions: . sodium chloride    . sodium chloride    . nitroGLYCERIN 20 mcg/min (09/06/18 0539)   PRN Meds: sodium chloride, sodium chloride, acetaminophen, ondansetron (ZOFRAN) IV, sodium chloride flush, sodium chloride flush   Vital Signs    Vitals:   09/05/18 2038 09/05/18 2315 09/06/18 0312 09/06/18 0437  BP: 135/69  (!) 153/79   Pulse: 64 64 61   Resp: 20     Temp: 98.4 F (36.9 C)   97.8 F (36.6 C)  TempSrc: Oral   Oral  SpO2: 99% 99% 96%   Weight:    66.2 kg  Height:        Intake/Output Summary (Last 24 hours) at 09/06/2018 0802 Last data filed at 09/06/2018 0538 Gross per 24 hour  Intake 840 ml  Output 50 ml  Net 790 ml   Last 3 Weights 09/06/2018 09/05/2018 09/04/2018  Weight (lbs) 145 lb 14.4 oz 141 lb 12.8 oz 141 lb 4.8 oz  Weight (kg) 66.18 kg 64.32 kg 64.093 kg      Telemetry    stopped - Personally Reviewed  ECG    No new - Personally Reviewed  Physical Exam   GEN: No acute distress.    Neck: No JVD Cardiac: RRR, no murmurs, rubs, or gallops. Rt groin cath site without hematoma no bleeding. Respiratory: Clear to auscultation bilaterally. GI: Soft, nontender, non-distended  MS: No edema; No deformity. Neuro:  Nonfocal  Psych: Normal affect   Labs    Chemistry Recent Labs  Lab 09/01/18 1216  09/03/18 1535 09/04/18 0446 09/05/18 0506 09/06/18 0433  NA 136   < > 136 136 135 134*  K 6.1*   < > 5.2* 4.3 4.4 4.5  CL 99   < > 97* 96* 93* 94*  CO2 26   < > 26 27 24 24   GLUCOSE 184*   < > 246* 201* 159* 191*  BUN 66*   < > 65* 37* 71* 101*  CREATININE 9.99*   < > 9.19* 6.38* 9.25* 11.56*  CALCIUM 9.1   < > 9.2 9.1 9.5 9.6  PROT 6.8  --   --   --   --   --   ALBUMIN 3.3*   < > 2.5* 2.6* 2.6*  --   AST 16  --   --   --   --   --  ALT 14  --   --   --   --   --   ALKPHOS 63  --   --   --   --   --   BILITOT 0.7  --   --   --   --   --   GFRNONAA 5*   < > 6* 9* 6* 5*  GFRAA 6*   < > 7* 11* 7* 5*  ANIONGAP 11   < > 13 13 18* 16*   < > = values in this interval not displayed.     Hematology Recent Labs  Lab 09/04/18 0446 09/05/18 0506 09/06/18 0433  WBC 8.9 9.7 8.4  RBC 3.31* 3.41* 3.40*  HGB 10.3* 10.4* 10.5*  HCT 31.7* 32.1* 31.5*  MCV 95.8 94.1 92.6  MCH 31.1 30.5 30.9  MCHC 32.5 32.4 33.3  RDW 14.4 14.3 14.4  PLT 122* 132* 163    Cardiac Enzymes Recent Labs  Lab 09/01/18 1516 09/01/18 1856 09/02/18 0219 09/03/18 1943  TROPONINI 1.91* 2.45* 2.22* 0.98*    Recent Labs  Lab 09/01/18 1226  TROPIPOC 0.65*     BNP Recent Labs  Lab 09/01/18 1856  BNP 4,237.8*     DDimer No results for input(s): DDIMER in the last 168 hours.   Radiology    Dg Chest 2 View  Result Date: 09/06/2018 CLINICAL DATA:  Follow-up pneumonia EXAM: CHEST - 2 VIEW COMPARISON:  09/01/2018 FINDINGS: Persistent patchy bilateral lower lobe pneumonia and volume loss. Similar appearance allowing for technical factors. Upper lobes remain largely clear. No visible  effusion. IMPRESSION: Persistent bilateral lower lobe pneumonia. Electronically Signed   By: Nelson Chimes M.D.   On: 09/06/2018 07:37    Cardiac Studies   Cardiac cath 09/05/18  Mid LAD lesion is 10% stenosed.  Ost 2nd Diag to 2nd Diag lesion is 10% stenosed.  LV end diastolic pressure is mildly elevated. LVEDP 18 mm Hg.  There is no aortic valve stenosis.   No significant CAD.  Continue aggressive secondary prevention.    Echo 09/02/18 IMPRESSIONS    1. The left ventricle has low normal systolic function of 02-77%. The cavity size is normal. There is moderate left ventricular wall thickness. Echo evidence of Indeterminate diastolic filling patterns.  2. Normal left atrial size.  3. Normal right atrial size.  4. Trivial pericardial effusion, as described above.  5. The mitral valve Thickened. There is mild thickening. Regurgitation is mild by color flow Doppler.  6. Normal tricuspid valve.  7. Tricuspid regurgitation is mild.  8. The aortic valve tricuspid. There is mild thickening of the aortic valve.  9. No atrial level shunt detected by color flow Doppler.  FINDINGS  Left Ventricle: The left ventricle has low normal systolic function of 41-28%. The cavity size is normal. There is moderate left ventricular wall thickness. Echo evidence of Indeterminate diastolic filling patterns. Right Ventricle: The right ventricle is normal in size. There is normal hypertrophy. There is normal systolic function. Left Atrium: The left atrium is normal in size. Right Atrium: The right atrial size is normal in size. Interatrial Septum: No atrial level shunt detected by color flow Doppler.  Pericardium: Trivial pericardial effusion is present. Mitral Valve: The mitral valve Thickened. There is mild thickening. Regurgitation is mild by color flow Doppler. Tricuspid Valve: The tricuspid valve is normal in structure. Tricuspid regurgitation is mild by color flow Doppler. Aortic Valve: The  aortic valve tricuspid. There is mild thickening of the aortic valve.  Pulmonic Valve: The pulmonic valve is grossly normal. Pulmonic valve regurgitation is not visualized by color flow Doppler.     Patient Profile     48 y.o. male with a hx of ESRD with AV graft, DM, HLD, HTN- not always controlled though at dialysis it runs low, and iron def. anemiaadmitted after fall and chest pain along with acute hypoxic respiratory failure with PNA.  Assessment & Plan    demand ischemia ESRD and hypoxia with troponin 2.45 at pk and no significant CAD on cath.  Pt with acute respiratory failure with PNA at time.  Echo with normal EF.  HLD  only 10 % CAD on Crestor  Diabetes per PCP  Hypoxic respiratory failure with CAP per IM on Vanc --repeat CXR today Persistent bilateral lower lobe pneumonia  ESRD on HD per renal.  Hyperkalemia resolved with k+ now 4.5 per renal  HTN controlled mostly currently 153/81  Pt noted to have syncope on admit but he stated he just fell putting on his pants and was not unresponsive.  This was with hypoxia.  Will sign off     For questions or updates, please contact [place] Please consult [email or web address] for contact info under        Signed, Cecilie Kicks, NP  09/06/2018, 8:02 AM

## 2018-09-06 NOTE — Progress Notes (Signed)
Pt noted vomiiting rushed to patient; syncopal episode for 26min; aspiration possible; Rapid response called; patient satbilized; another syncopal episode noted after Dr. Mina Marble came and assessed the patient' rapid response called again; SBP dropped to 80's; CBG =151. Ernest Haber, PA nephrologhy ordered to stopped the treatment; pt stabilized before returning to his room; report given to [facility], RN.

## 2018-09-07 ENCOUNTER — Encounter (HOSPITAL_COMMUNITY): Payer: Self-pay | Admitting: General Practice

## 2018-09-07 ENCOUNTER — Other Ambulatory Visit: Payer: Self-pay

## 2018-09-07 DIAGNOSIS — J9601 Acute respiratory failure with hypoxia: Secondary | ICD-10-CM

## 2018-09-07 DIAGNOSIS — R0902 Hypoxemia: Secondary | ICD-10-CM

## 2018-09-07 DIAGNOSIS — J189 Pneumonia, unspecified organism: Secondary | ICD-10-CM

## 2018-09-07 DIAGNOSIS — R9431 Abnormal electrocardiogram [ECG] [EKG]: Secondary | ICD-10-CM

## 2018-09-07 HISTORY — DX: Pneumonia, unspecified organism: J18.9

## 2018-09-07 HISTORY — DX: Acute respiratory failure with hypoxia: J96.01

## 2018-09-07 LAB — CBC
HCT: 30.6 % — ABNORMAL LOW (ref 39.0–52.0)
Hemoglobin: 9.9 g/dL — ABNORMAL LOW (ref 13.0–17.0)
MCH: 30.5 pg (ref 26.0–34.0)
MCHC: 32.4 g/dL (ref 30.0–36.0)
MCV: 94.2 fL (ref 80.0–100.0)
Platelets: 174 10*3/uL (ref 150–400)
RBC: 3.25 MIL/uL — ABNORMAL LOW (ref 4.22–5.81)
RDW: 14.3 % (ref 11.5–15.5)
WBC: 10.3 10*3/uL (ref 4.0–10.5)
nRBC: 0.4 % — ABNORMAL HIGH (ref 0.0–0.2)

## 2018-09-07 LAB — BASIC METABOLIC PANEL
Anion gap: 11 (ref 5–15)
BUN: 60 mg/dL — ABNORMAL HIGH (ref 6–20)
CO2: 29 mmol/L (ref 22–32)
Calcium: 9.2 mg/dL (ref 8.9–10.3)
Chloride: 94 mmol/L — ABNORMAL LOW (ref 98–111)
Creatinine, Ser: 9.37 mg/dL — ABNORMAL HIGH (ref 0.61–1.24)
GFR calc Af Amer: 7 mL/min — ABNORMAL LOW (ref >60)
GFR calc non Af Amer: 6 mL/min — ABNORMAL LOW (ref >60)
GLUCOSE: 274 mg/dL — AB (ref 70–99)
Potassium: 4.4 mmol/L (ref 3.5–5.1)
Sodium: 134 mmol/L — ABNORMAL LOW (ref 135–145)

## 2018-09-07 LAB — GLUCOSE, CAPILLARY
Glucose-Capillary: 232 mg/dL — ABNORMAL HIGH (ref 70–99)
Glucose-Capillary: 250 mg/dL — ABNORMAL HIGH (ref 70–99)
Glucose-Capillary: 193 mg/dL — ABNORMAL HIGH (ref 70–99)

## 2018-09-07 MED ORDER — GABAPENTIN 300 MG PO CAPS: 300.0000 mg | ORAL_CAPSULE | ORAL | 0 refills | Status: DC

## 2018-09-07 MED ORDER — GABAPENTIN 300 MG PO CAPS: 300.0000 mg | ORAL_CAPSULE | ORAL | Status: DC

## 2018-09-07 MED ORDER — ASPIRIN 81 MG PO TBEC: 81.0000 mg | DELAYED_RELEASE_TABLET | Freq: Every day | ORAL | 0 refills | Status: DC

## 2018-09-07 MED ORDER — METOPROLOL TARTRATE 25 MG PO TABS: 25.0000 mg | ORAL_TABLET | Freq: Two times a day (BID) | ORAL | 0 refills | Status: DC

## 2018-09-07 MED ORDER — ROSUVASTATIN CALCIUM 20 MG PO TABS: 20.0000 mg | ORAL_TABLET | Freq: Every day | ORAL | 0 refills | Status: DC

## 2018-09-07 NOTE — Progress Notes (Signed)
Pt stable, ready to be DC home with wife. Report given to night shift.

## 2018-09-07 NOTE — Discharge Instructions (Signed)
You were treated at [facility] for chest pain and pneumonia. You had an echocardiogram (an ultrasound of your heart) and a heart catheterization to look at the arteries of your heart. Both of these were normal. You were treated with antibiotics for your pneumonia. It will be very important for you to follow up with your primary care doctor in 1-2 weeks for a follow up visit.   Please continue to take: Aspirin 81mg : 1 pill a day, Metorpolol 25mg : 1 pill twice a day, Crestor 20mg : 1 pill a day, and Norvasc 5mg : 1 pill a day.   We recommend only taking Gabapentin on days you have hemodialysis. This may help your tremors. Please continue to discuss your tremors with your PCP for further evaluation, especially if no improvement after adjusting your Gabapentin.

## 2018-09-07 NOTE — Progress Notes (Signed)
Subjective:  No cos now sitting  In bedside chair by window /reports tolerated brk . No sob, no cp  Objective Vital signs in last 24 hours: Vitals:   09/06/18 2034 09/06/18 2059 09/07/18 0448 09/07/18 0847  BP:  (!) 165/75 (!) 151/74 (!) 151/72  Pulse: 76 78 66 70  Resp:      Temp:  98.2 F (36.8 C) 98.7 F (37.1 C)   TempSrc:  Oral Oral   SpO2: 95% 97% 96%   Weight:   64.5 kg   Height:       Weight change: -0.78 kg  Physical Exam: General:  alert NAD Heart: RRR Lungs: CTA Bilat  Abdomen: BS pos soft NT, ND Extremities: no pedal edema  Dialysis Access: AVF patent on HD   OP Dialysis Orders: TTS [facility] 3.75 hours EDW 64.5 2K 2 Ca heparin 2000 Hectorol 3 no ESA/Fe Recent labs: hgb 12.6 last Mircera 50 on 1/07 42% sat iPTH 279  Problem/Plan: 1. Acute hypoxic respiratory failure with fever: HCAP +/- volume excess. Flu negative. BCxno growth so far. ^ Pro-calcitonin and BNP. 2. NSTEMI:Card Cath 09/05/18  ( no significant CAD )+ CP on admit, Trop peaked 2.45,Cardiology signed off . 3. Hyperkalemia:On admit, Am 4.5resolved with HD. 4. ESRD: Usually TTS schedule. S/p HDovernight on admit d/t volume and ^ K. Now back to usual schedule, next 2/6  Yesterday episode of Syncopal episode and vomiting while supine in HD after eating Biscuit  1 hr ealier ? With Bp drop to 87/  tomor no edw challenge  5. Hypertension/volume: Variable weights ,yest post hd  65.4 ( edw 64.5), this am 64.5 .CXR 2/04 am "persistent bilat lower low PNA"  bp stable  Meds = Amlodipine 5mg   hs  / Metop 25 mg bid   6. Anemiaof ESRD: Hgb10.3, continue Aranesp q Sat here. 7. Metabolic bone disease: Corr Ca ^ Maren Reamer  Continue binders (Velphoro) and hold  VDRA with Corec ca>10  8. Nutrition: Alb2.6, continue Nepro supplements. 9. DM: On insulin, per primary   Ernest Haber, PA-C [facility] Beeper [phone] 09/07/2018,11:18 AM  LOS: 6 days   Labs: Basic Metabolic Panel: Recent Labs  Lab  09/03/18 1535 09/04/18 0446 09/05/18 0506 09/06/18 0433 09/07/18 0425  NA 136 136 135 134* 134*  K 5.2* 4.3 4.4 4.5 4.4  CL 97* 96* 93* 94* 94*  CO2 26 27 24 24 29   GLUCOSE 246* 201* 159* 191* 274*  BUN 65* 37* 71* 101* 60*  CREATININE 9.19* 6.38* 9.25* 11.56* 9.37*  CALCIUM 9.2 9.1 9.5 9.6 9.2  PHOS 3.0 4.1 3.9  --   --    Liver Function Tests: Recent Labs  Lab 09/01/18 1216  09/03/18 1535 09/04/18 0446 09/05/18 0506  AST 16  --   --   --   --   ALT 14  --   --   --   --   ALKPHOS 63  --   --   --   --   BILITOT 0.7  --   --   --   --   PROT 6.8  --   --   --   --   ALBUMIN 3.3*   < > 2.5* 2.6* 2.6*   < > = values in this interval not displayed.   No results for input(s): LIPASE, AMYLASE in the last 168 hours. No results for input(s): AMMONIA in the last 168 hours. CBC: Recent Labs  Lab 09/01/18 1216  09/04/18 0446  09/05/18 0506 09/06/18 0433 09/06/18 1030 09/07/18 0425  WBC 11.8*   < > 8.9 9.7 8.4 9.2 10.3  NEUTROABS 9.2*  --   --   --   --   --   --   HGB 10.4*   < > 10.3* 10.4* 10.5* 10.5* 9.9*  HCT 33.5*   < > 31.7* 32.1* 31.5* 32.6* 30.6*  MCV 96.5   < > 95.8 94.1 92.6 93.4 94.2  PLT 139*   < > 122* 132* 163 153 174   < > = values in this interval not displayed.   Cardiac Enzymes: Recent Labs  Lab 09/01/18 1516 09/01/18 1856 09/02/18 0219 09/03/18 1943 09/06/18 1058  TROPONINI 1.91* 2.45* 2.22* 0.98* 0.69*   CBG: Recent Labs  Lab 09/06/18 1056 09/06/18 1211 09/06/18 1638 09/06/18 2100 09/07/18 0804  GLUCAP 151* 145* 248* 295* 232*    Studies/Results: Dg Chest 2 View  Result Date: 09/06/2018 CLINICAL DATA:  Follow-up pneumonia EXAM: CHEST - 2 VIEW COMPARISON:  09/01/2018 FINDINGS: Persistent patchy bilateral lower lobe pneumonia and volume loss. Similar appearance allowing for technical factors. Upper lobes remain largely clear. No visible effusion. IMPRESSION: Persistent bilateral lower lobe pneumonia. Electronically Signed   By: Nelson Chimes M.D.   On: 09/06/2018 07:37   Medications: . sodium chloride    . sodium chloride     . amLODipine  5 mg Oral Q breakfast  . aspirin EC  81 mg Oral Daily  . Chlorhexidine Gluconate Cloth  6 each Topical Q0600  . darbepoetin (ARANESP) injection - DIALYSIS  60 mcg Intravenous Q Sat-HD  . doxercalciferol  3 mcg Intravenous Q T,Th,Sa-HD  . feeding supplement (NEPRO CARB STEADY)  237 mL Oral BID BM  . gabapentin  300 mg Oral QHS  . insulin aspart  0-9 Units Subcutaneous TID WC  . levofloxacin  500 mg Oral Q48H  . metoprolol tartrate  12.5 mg Oral Once  . metoprolol tartrate  25 mg Oral BID WC  . multivitamin  1 tablet Oral QHS  . pantoprazole (PROTONIX) IV  40 mg Intravenous Q24H  . rosuvastatin  20 mg Oral q1800  . sodium chloride flush  3 mL Intravenous Q12H  . sodium chloride flush  3 mL Intravenous Q12H  . sucroferric oxyhydroxide  1,000 mg Oral TID WC

## 2018-09-07 NOTE — Progress Notes (Signed)
Inpatient Diabetes Program Recommendations  AACE/ADA: New Consensus Statement on Inpatient Glycemic Control (2015)  Target Ranges:  Prepandial:   less than 140 mg/dL      Peak postprandial:   less than 180 mg/dL (1-2 hours)      Critically ill patients:  140 - 180 mg/dL   Lab Results  Component Value Date   GLUCAP 250 (H) 09/07/2018   HGBA1C 7.4 (H) 08/26/2018    Review of Glycemic ControlResults for ACEVEDO VINOD, MIKESELL (MRN [id number]) as of 09/07/2018 13:52  Ref. Range 09/06/2018 16:38 09/06/2018 21:00 09/07/2018 08:04 09/07/2018 11:51  Glucose-Capillary Latest Ref Range: 70 - 99 mg/dL 248 (H) 295 (H) 232 (H) 250 (H)    Diabetes history: DM 2 Outpatient Diabetes medications:  Glucotrol 5 mg bid Current orders for Inpatient glycemic control:  Novolog sensitive tid with meals   Inpatient Diabetes Program Recommendations:    Note blood sugars increased.  Oral DM medications on hold.  Consider adding Levemir 5 units daily while patient is in the hospital.   Thanks  Adah Perl, RN, BC-ADM Inpatient Diabetes Coordinator Pager [phone] (8a-5p)

## 2018-09-08 ENCOUNTER — Ambulatory Visit: Payer: Self-pay | Admitting: Internal Medicine

## 2018-09-14 ENCOUNTER — Encounter: Payer: Self-pay | Admitting: Internal Medicine

## 2018-09-21 ENCOUNTER — Other Ambulatory Visit (HOSPITAL_COMMUNITY): Payer: Self-pay | Admitting: Family Medicine

## 2018-10-17 ENCOUNTER — Other Ambulatory Visit: Payer: Self-pay

## 2018-10-17 ENCOUNTER — Telehealth: Payer: Self-pay | Admitting: Internal Medicine

## 2018-10-17 MED ORDER — ROSUVASTATIN CALCIUM 20 MG PO TABS: 20.0000 mg | ORAL_TABLET | Freq: Every day | ORAL | 11 refills | Status: DC

## 2018-10-17 NOTE — Telephone Encounter (Signed)
Pt. Is requesting medication refill for rosuvastatin (CRESTOR) 20 MG tablet  And states his took this medication on 10/13/2018 medication refill to be sent to [facility] on [address].

## 2018-10-17 NOTE — Telephone Encounter (Signed)
Rx sent to pharmacy   

## 2018-10-19 ENCOUNTER — Ambulatory Visit: Payer: Self-pay | Admitting: Internal Medicine

## 2018-10-20 NOTE — Telephone Encounter (Signed)
Pt. Informed and will pick up medicine today

## 2018-11-10 ENCOUNTER — Other Ambulatory Visit: Payer: Self-pay | Admitting: Internal Medicine

## 2018-11-22 ENCOUNTER — Inpatient Hospital Stay (HOSPITAL_COMMUNITY)
Admission: EM | Admit: 2018-11-22 | Discharge: 2018-12-01 | DRG: 208 | Disposition: A | Discharge status: Home / Self Care | Payer: Medicaid Other | Attending: Family Medicine | Admitting: Family Medicine

## 2018-11-22 ENCOUNTER — Encounter (HOSPITAL_COMMUNITY): Payer: Self-pay | Admitting: Emergency Medicine

## 2018-11-22 ENCOUNTER — Inpatient Hospital Stay (HOSPITAL_COMMUNITY): Payer: Medicaid Other

## 2018-11-22 ENCOUNTER — Emergency Department (HOSPITAL_COMMUNITY): Payer: Medicaid Other

## 2018-11-22 ENCOUNTER — Other Ambulatory Visit: Payer: Self-pay

## 2018-11-22 DIAGNOSIS — J80 Acute respiratory distress syndrome: Secondary | ICD-10-CM | POA: Diagnosis present

## 2018-11-22 DIAGNOSIS — E785 Hyperlipidemia, unspecified: Secondary | ICD-10-CM | POA: Diagnosis present

## 2018-11-22 DIAGNOSIS — Z87891 Personal history of nicotine dependence: Secondary | ICD-10-CM | POA: Diagnosis not present

## 2018-11-22 DIAGNOSIS — Z978 Presence of other specified devices: Secondary | ICD-10-CM

## 2018-11-22 DIAGNOSIS — E11319 Type 2 diabetes mellitus with unspecified diabetic retinopathy without macular edema: Secondary | ICD-10-CM | POA: Diagnosis present

## 2018-11-22 DIAGNOSIS — J1289 Other viral pneumonia: Secondary | ICD-10-CM | POA: Diagnosis present

## 2018-11-22 DIAGNOSIS — J9600 Acute respiratory failure, unspecified whether with hypoxia or hypercapnia: Secondary | ICD-10-CM

## 2018-11-22 DIAGNOSIS — D849 Immunodeficiency, unspecified: Secondary | ICD-10-CM | POA: Diagnosis present

## 2018-11-22 DIAGNOSIS — D631 Anemia in chronic kidney disease: Secondary | ICD-10-CM | POA: Diagnosis present

## 2018-11-22 DIAGNOSIS — Z7984 Long term (current) use of oral hypoglycemic drugs: Secondary | ICD-10-CM

## 2018-11-22 DIAGNOSIS — I10 Essential (primary) hypertension: Secondary | ICD-10-CM | POA: Diagnosis present

## 2018-11-22 DIAGNOSIS — N186 End stage renal disease: Secondary | ICD-10-CM

## 2018-11-22 DIAGNOSIS — E1140 Type 2 diabetes mellitus with diabetic neuropathy, unspecified: Secondary | ICD-10-CM | POA: Diagnosis present

## 2018-11-22 DIAGNOSIS — E1122 Type 2 diabetes mellitus with diabetic chronic kidney disease: Secondary | ICD-10-CM | POA: Diagnosis present

## 2018-11-22 DIAGNOSIS — Z841 Family history of disorders of kidney and ureter: Secondary | ICD-10-CM

## 2018-11-22 DIAGNOSIS — E118 Type 2 diabetes mellitus with unspecified complications: Secondary | ICD-10-CM | POA: Diagnosis present

## 2018-11-22 DIAGNOSIS — Z833 Family history of diabetes mellitus: Secondary | ICD-10-CM

## 2018-11-22 DIAGNOSIS — I2510 Atherosclerotic heart disease of native coronary artery without angina pectoris: Secondary | ICD-10-CM | POA: Diagnosis present

## 2018-11-22 DIAGNOSIS — R6889 Other general symptoms and signs: Secondary | ICD-10-CM | POA: Diagnosis not present

## 2018-11-22 DIAGNOSIS — T380X5A Adverse effect of glucocorticoids and synthetic analogues, initial encounter: Secondary | ICD-10-CM | POA: Diagnosis present

## 2018-11-22 DIAGNOSIS — D509 Iron deficiency anemia, unspecified: Secondary | ICD-10-CM | POA: Diagnosis present

## 2018-11-22 DIAGNOSIS — Z9911 Dependence on respirator [ventilator] status: Secondary | ICD-10-CM

## 2018-11-22 DIAGNOSIS — I5032 Chronic diastolic (congestive) heart failure: Secondary | ICD-10-CM | POA: Diagnosis present

## 2018-11-22 DIAGNOSIS — R7612 Nonspecific reaction to cell mediated immunity measurement of gamma interferon antigen response without active tuberculosis: Secondary | ICD-10-CM | POA: Diagnosis not present

## 2018-11-22 DIAGNOSIS — I132 Hypertensive heart and chronic kidney disease with heart failure and with stage 5 chronic kidney disease, or end stage renal disease: Secondary | ICD-10-CM | POA: Diagnosis present

## 2018-11-22 DIAGNOSIS — Z992 Dependence on renal dialysis: Secondary | ICD-10-CM | POA: Diagnosis not present

## 2018-11-22 DIAGNOSIS — R0602 Shortness of breath: Secondary | ICD-10-CM | POA: Diagnosis present

## 2018-11-22 DIAGNOSIS — Z201 Contact with and (suspected) exposure to tuberculosis: Secondary | ICD-10-CM

## 2018-11-22 DIAGNOSIS — J9601 Acute respiratory failure with hypoxia: Secondary | ICD-10-CM | POA: Diagnosis not present

## 2018-11-22 DIAGNOSIS — E871 Hypo-osmolality and hyponatremia: Secondary | ICD-10-CM | POA: Diagnosis present

## 2018-11-22 DIAGNOSIS — Z20822 Contact with and (suspected) exposure to covid-19: Secondary | ICD-10-CM

## 2018-11-22 DIAGNOSIS — R0902 Hypoxemia: Secondary | ICD-10-CM

## 2018-11-22 DIAGNOSIS — N2581 Secondary hyperparathyroidism of renal origin: Secondary | ICD-10-CM | POA: Diagnosis present

## 2018-11-22 DIAGNOSIS — J189 Pneumonia, unspecified organism: Secondary | ICD-10-CM | POA: Diagnosis not present

## 2018-11-22 DIAGNOSIS — J810 Acute pulmonary edema: Secondary | ICD-10-CM | POA: Diagnosis not present

## 2018-11-22 DIAGNOSIS — Z7982 Long term (current) use of aspirin: Secondary | ICD-10-CM

## 2018-11-22 DIAGNOSIS — E875 Hyperkalemia: Secondary | ICD-10-CM | POA: Diagnosis present

## 2018-11-22 DIAGNOSIS — B370 Candidal stomatitis: Secondary | ICD-10-CM | POA: Diagnosis not present

## 2018-11-22 DIAGNOSIS — E1129 Type 2 diabetes mellitus with other diabetic kidney complication: Secondary | ICD-10-CM | POA: Diagnosis present

## 2018-11-22 DIAGNOSIS — E8889 Other specified metabolic disorders: Secondary | ICD-10-CM | POA: Diagnosis present

## 2018-11-22 DIAGNOSIS — J811 Chronic pulmonary edema: Secondary | ICD-10-CM

## 2018-11-22 DIAGNOSIS — I152 Hypertension secondary to endocrine disorders: Secondary | ICD-10-CM | POA: Diagnosis present

## 2018-11-22 DIAGNOSIS — Z8249 Family history of ischemic heart disease and other diseases of the circulatory system: Secondary | ICD-10-CM

## 2018-11-22 DIAGNOSIS — Z79899 Other long term (current) drug therapy: Secondary | ICD-10-CM

## 2018-11-22 LAB — POCT I-STAT 7, (LYTES, BLD GAS, ICA,H+H)
Acid-Base Excess: 7 mmol/L — ABNORMAL HIGH (ref 0.0–2.0)
Acid-Base Excess: 2 mmol/L (ref 0.0–2.0)
Bicarbonate: 30.9 mmol/L — ABNORMAL HIGH (ref 20.0–28.0)
Bicarbonate: 27.6 mmol/L (ref 20.0–28.0)
Calcium, Ion: 1.24 mmol/L (ref 1.15–1.40)
Calcium, Ion: 1.21 mmol/L (ref 1.15–1.40)
HCT: 32 % — ABNORMAL LOW (ref 39.0–52.0)
HCT: 29 % — ABNORMAL LOW (ref 39.0–52.0)
Hemoglobin: 10.9 g/dL — ABNORMAL LOW (ref 13.0–17.0)
Hemoglobin: 9.9 g/dL — ABNORMAL LOW (ref 13.0–17.0)
O2 Saturation: 92 %
O2 Saturation: 94 %
Patient temperature: 99.4
Patient temperature: 101.5
Potassium: 5.5 mmol/L — ABNORMAL HIGH (ref 3.5–5.1)
Potassium: 6.9 mmol/L (ref 3.5–5.1)
Sodium: 138 mmol/L (ref 135–145)
Sodium: 134 mmol/L — ABNORMAL LOW (ref 135–145)
TCO2: 32 mmol/L (ref 22–32)
TCO2: 29 mmol/L (ref 22–32)
pCO2 arterial: 43.1 mmHg (ref 32.0–48.0)
pCO2 arterial: 47.6 mmHg (ref 32.0–48.0)
pH, Arterial: 7.465 — ABNORMAL HIGH (ref 7.350–7.450)
pH, Arterial: 7.379 (ref 7.350–7.450)
pO2, Arterial: 63 mmHg — ABNORMAL LOW (ref 83.0–108.0)
pO2, Arterial: 77 mmHg — ABNORMAL LOW (ref 83.0–108.0)

## 2018-11-22 LAB — CBC WITH DIFFERENTIAL/PLATELET
Basophils Absolute: 0.1 10*3/uL (ref 0.0–0.1)
Basophils Relative: 1 %
Eosinophils Absolute: 0.6 10*3/uL — ABNORMAL HIGH (ref 0.0–0.5)
Eosinophils Relative: 5 %
HCT: 35.6 % — ABNORMAL LOW (ref 39.0–52.0)
Hemoglobin: 11 g/dL — ABNORMAL LOW (ref 13.0–17.0)
Lymphocytes Relative: 16 %
Lymphs Abs: 1.8 10*3/uL (ref 0.7–4.0)
MCH: 29.3 pg (ref 26.0–34.0)
MCHC: 30.9 g/dL (ref 30.0–36.0)
MCV: 94.9 fL (ref 80.0–100.0)
Monocytes Absolute: 0.7 10*3/uL (ref 0.1–1.0)
Monocytes Relative: 6 %
Neutro Abs: 8.2 10*3/uL — ABNORMAL HIGH (ref 1.7–7.7)
Neutrophils Relative %: 72 %
Platelets: 163 10*3/uL (ref 150–400)
RBC: 3.75 MIL/uL — ABNORMAL LOW (ref 4.22–5.81)
RDW: 14 % (ref 11.5–15.5)
WBC: 11.4 10*3/uL — ABNORMAL HIGH (ref 4.0–10.5)

## 2018-11-22 LAB — LIPID PANEL
Cholesterol: 128 mg/dL (ref 0–200)
HDL: 39 mg/dL — ABNORMAL LOW (ref >40)
LDL Cholesterol: 73 mg/dL (ref 0–99)
Total CHOL/HDL Ratio: 3.3 RATIO
Triglycerides: 78 mg/dL (ref <150)
VLDL: 16 mg/dL (ref 0–40)

## 2018-11-22 LAB — CBC
HCT: 32.8 % — ABNORMAL LOW (ref 39.0–52.0)
Hemoglobin: 10.3 g/dL — ABNORMAL LOW (ref 13.0–17.0)
MCH: 30 pg (ref 26.0–34.0)
MCHC: 31.4 g/dL (ref 30.0–36.0)
MCV: 95.6 fL (ref 80.0–100.0)
Platelets: 144 10*3/uL — ABNORMAL LOW (ref 150–400)
RBC: 3.43 MIL/uL — ABNORMAL LOW (ref 4.22–5.81)
RDW: 14.3 % (ref 11.5–15.5)
WBC: 12.5 10*3/uL — ABNORMAL HIGH (ref 4.0–10.5)
nRBC: 0 % (ref 0.0–0.2)

## 2018-11-22 LAB — GLUCOSE, CAPILLARY
Glucose-Capillary: 159 mg/dL — ABNORMAL HIGH (ref 70–99)
Glucose-Capillary: 160 mg/dL — ABNORMAL HIGH (ref 70–99)
Glucose-Capillary: 133 mg/dL — ABNORMAL HIGH (ref 70–99)

## 2018-11-22 LAB — COMPREHENSIVE METABOLIC PANEL
ALT: 11 U/L (ref 0–44)
AST: 15 U/L (ref 15–41)
Albumin: 3.8 g/dL (ref 3.5–5.0)
Alkaline Phosphatase: 87 U/L (ref 38–126)
Anion gap: 14 (ref 5–15)
BUN: 63 mg/dL — ABNORMAL HIGH (ref 6–20)
CO2: 29 mmol/L (ref 22–32)
Calcium: 10 mg/dL (ref 8.9–10.3)
Chloride: 97 mmol/L — ABNORMAL LOW (ref 98–111)
Creatinine, Ser: 10.49 mg/dL — ABNORMAL HIGH (ref 0.61–1.24)
GFR calc Af Amer: 6 mL/min — ABNORMAL LOW (ref >60)
GFR calc non Af Amer: 5 mL/min — ABNORMAL LOW (ref >60)
Glucose, Bld: 135 mg/dL — ABNORMAL HIGH (ref 70–99)
Potassium: 5.4 mmol/L — ABNORMAL HIGH (ref 3.5–5.1)
Sodium: 140 mmol/L (ref 135–145)
Total Bilirubin: 0.6 mg/dL (ref 0.3–1.2)
Total Protein: 7.7 g/dL (ref 6.5–8.1)

## 2018-11-22 LAB — TROPONIN I
Troponin I: 0.03 ng/mL (ref <0.03)
Troponin I: 0.04 ng/mL (ref <0.03)
Troponin I: 0.08 ng/mL (ref <0.03)
Troponin I: 0.11 ng/mL (ref <0.03)

## 2018-11-22 LAB — LACTIC ACID, PLASMA
Lactic Acid, Venous: 1 mmol/L (ref 0.5–1.9)
Lactic Acid, Venous: 0.9 mmol/L (ref 0.5–1.9)

## 2018-11-22 LAB — TSH: TSH: 1.693 u[IU]/mL (ref 0.350–4.500)

## 2018-11-22 LAB — PROCALCITONIN: Procalcitonin: 2.15 ng/mL

## 2018-11-22 LAB — CBG MONITORING, ED: Glucose-Capillary: 127 mg/dL — ABNORMAL HIGH (ref 70–99)

## 2018-11-22 LAB — CREATININE, SERUM
Creatinine, Ser: 10.95 mg/dL — ABNORMAL HIGH (ref 0.61–1.24)
GFR calc Af Amer: 6 mL/min — ABNORMAL LOW (ref >60)
GFR calc non Af Amer: 5 mL/min — ABNORMAL LOW (ref >60)

## 2018-11-22 LAB — HEMOGLOBIN A1C
Hgb A1c MFr Bld: 7.1 % — ABNORMAL HIGH (ref 4.8–5.6)
Mean Plasma Glucose: 157.07 mg/dL

## 2018-11-22 LAB — SARS CORONAVIRUS 2 BY RT PCR (HOSPITAL ORDER, PERFORMED IN ~~LOC~~ HOSPITAL LAB)
SARS Coronavirus 2: NEGATIVE
SARS Coronavirus 2: POSITIVE — AB

## 2018-11-22 LAB — POC OCCULT BLOOD, ED: Fecal Occult Bld: NEGATIVE

## 2018-11-22 LAB — BRAIN NATRIURETIC PEPTIDE: B Natriuretic Peptide: 2559.3 pg/mL — ABNORMAL HIGH (ref 0.0–100.0)

## 2018-11-22 MED ORDER — HEPARIN SODIUM (PORCINE) 1000 UNIT/ML DIALYSIS
1000.0000 [IU] | INTRAMUSCULAR | Status: DC | PRN
  Administered 2018-11-26: 06:00:00 3000 [IU] via INTRAVENOUS_CENTRAL
  Filled 2018-11-22 (×2): qty 6
  Filled 2018-11-22: qty 1
  Filled 2018-11-22: qty 4

## 2018-11-22 MED ORDER — METOPROLOL TARTRATE 12.5 MG HALF TABLET
25.0000 mg | ORAL_TABLET | Freq: Two times a day (BID) | ORAL | Status: DC
  Administered 2018-11-22 (×2): 25 mg via ORAL
  Filled 2018-11-22 (×2): qty 1

## 2018-11-22 MED ORDER — FENTANYL 2500MCG IN NS 250ML (10MCG/ML) PREMIX INFUSION
0.0000 ug/h | INTRAVENOUS | Status: DC
  Administered 2018-11-22: 22:00:00 150 ug/h via INTRAVENOUS
  Filled 2018-11-22: qty 250

## 2018-11-22 MED ORDER — MIDAZOLAM HCL 2 MG/2ML IJ SOLN
2.0000 mg | Freq: Once | INTRAMUSCULAR | Status: DC
  Filled 2018-11-22: qty 2

## 2018-11-22 MED ORDER — PROPOFOL 1000 MG/100ML IV EMUL
0.0000 ug/kg/min | INTRAVENOUS | Status: DC
  Administered 2018-11-22: 10 ug/kg/min via INTRAVENOUS
  Administered 2018-11-23 (×2): 30 ug/kg/min via INTRAVENOUS
  Administered 2018-11-24 (×2): 50 ug/kg/min via INTRAVENOUS
  Administered 2018-11-24: 23:00:00 46 ug/kg/min via INTRAVENOUS
  Administered 2018-11-24: 18:00:00 40 ug/kg/min via INTRAVENOUS
  Administered 2018-11-25: 05:00:00 45 ug/kg/min via INTRAVENOUS
  Filled 2018-11-22 (×10): qty 100

## 2018-11-22 MED ORDER — SODIUM CHLORIDE 0.9 % IV SOLN
1.0000 g | INTRAVENOUS | Status: DC
  Filled 2018-11-22: qty 1

## 2018-11-22 MED ORDER — INSULIN ASPART 100 UNIT/ML ~~LOC~~ SOLN
0.0000 [IU] | Freq: Three times a day (TID) | SUBCUTANEOUS | Status: DC
  Administered 2018-11-22 (×2): 2 [IU] via SUBCUTANEOUS

## 2018-11-22 MED ORDER — ROSUVASTATIN CALCIUM 20 MG PO TABS
20.0000 mg | ORAL_TABLET | Freq: Every day | ORAL | Status: DC
  Administered 2018-11-22: 20 mg via ORAL
  Filled 2018-11-22: qty 1

## 2018-11-22 MED ORDER — FENTANYL CITRATE (PF) 100 MCG/2ML IJ SOLN: 50.0000 ug | INTRAMUSCULAR | Status: DC | PRN

## 2018-11-22 MED ORDER — ACETAMINOPHEN 650 MG RE SUPP
650.0000 mg | Freq: Four times a day (QID) | RECTAL | Status: DC | PRN
  Filled 2018-11-22: qty 1

## 2018-11-22 MED ORDER — FENTANYL CITRATE (PF) 100 MCG/2ML IJ SOLN
INTRAMUSCULAR | Status: AC
  Filled 2018-11-22: qty 2

## 2018-11-22 MED ORDER — VANCOMYCIN HCL 10 G IV SOLR
1250.0000 mg | Freq: Once | INTRAVENOUS | Status: AC
  Administered 2018-11-22: 05:00:00 1250 mg via INTRAVENOUS
  Filled 2018-11-22: qty 1250

## 2018-11-22 MED ORDER — MIDAZOLAM HCL 2 MG/2ML IJ SOLN
INTRAMUSCULAR | Status: AC
  Filled 2018-11-22: qty 2

## 2018-11-22 MED ORDER — VANCOMYCIN VARIABLE DOSE PER UNSTABLE RENAL FUNCTION (PHARMACIST DOSING): Status: DC

## 2018-11-22 MED ORDER — ACETAMINOPHEN 325 MG PO TABS
650.0000 mg | ORAL_TABLET | Freq: Four times a day (QID) | ORAL | Status: DC | PRN
  Administered 2018-11-22 – 2018-11-29 (×2): 650 mg via ORAL
  Filled 2018-11-22 (×2): qty 2

## 2018-11-22 MED ORDER — ETOMIDATE 2 MG/ML IV SOLN: 20.0000 mg | Freq: Once | INTRAVENOUS | Status: DC

## 2018-11-22 MED ORDER — SODIUM CHLORIDE 0.9 % IV SOLN
500.0000 mg | INTRAVENOUS | Status: DC
  Administered 2018-11-23: 500 mg via INTRAVENOUS
  Filled 2018-11-22 (×2): qty 500

## 2018-11-22 MED ORDER — SODIUM CHLORIDE 0.9 % IV SOLN
1.0000 g | Freq: Once | INTRAVENOUS | Status: AC
  Administered 2018-11-22: 05:00:00 1 g via INTRAVENOUS
  Filled 2018-11-22: qty 1

## 2018-11-22 MED ORDER — CHLORHEXIDINE GLUCONATE CLOTH 2 % EX PADS
6.0000 | MEDICATED_PAD | Freq: Every day | CUTANEOUS | Status: DC
  Administered 2018-11-23 – 2018-11-24 (×2): 6 via TOPICAL

## 2018-11-22 MED ORDER — SODIUM ZIRCONIUM CYCLOSILICATE 10 G PO PACK
10.0000 g | PACK | Freq: Two times a day (BID) | ORAL | Status: DC
  Filled 2018-11-22: qty 1

## 2018-11-22 MED ORDER — SODIUM CHLORIDE 0.9 % IV SOLN
250.0000 [IU]/h | INTRAVENOUS | Status: DC
  Administered 2018-11-23: 12:00:00 1150 [IU]/h via INTRAVENOUS_CENTRAL
  Administered 2018-11-23: 450 [IU]/h via INTRAVENOUS_CENTRAL
  Administered 2018-11-24 (×2): 1050 [IU]/h via INTRAVENOUS_CENTRAL
  Administered 2018-11-25: 06:00:00 1250 [IU]/h via INTRAVENOUS_CENTRAL
  Administered 2018-11-25: 20:00:00 1400 [IU]/h via INTRAVENOUS_CENTRAL
  Administered 2018-11-25: 13:00:00 1350 [IU]/h via INTRAVENOUS_CENTRAL
  Administered 2018-11-26: 03:00:00 1400 [IU]/h via INTRAVENOUS_CENTRAL
  Filled 2018-11-22: qty 10000
  Filled 2018-11-22 (×5): qty 2
  Filled 2018-11-22 (×2): qty 10000
  Filled 2018-11-22 (×3): qty 2
  Filled 2018-11-22: qty 10000
  Filled 2018-11-22: qty 2

## 2018-11-22 MED ORDER — AMLODIPINE BESYLATE 5 MG PO TABS
5.0000 mg | ORAL_TABLET | Freq: Every day | ORAL | Status: DC
  Administered 2018-11-22 – 2018-11-25 (×4): 5 mg via ORAL
  Filled 2018-11-22 (×4): qty 1

## 2018-11-22 MED ORDER — HEPARIN BOLUS VIA INFUSION (CRRT)
1000.0000 [IU] | INTRAVENOUS | Status: DC | PRN
  Filled 2018-11-22: qty 1000

## 2018-11-22 MED ORDER — SUCROFERRIC OXYHYDROXIDE 500 MG PO CHEW
1000.0000 mg | CHEWABLE_TABLET | Freq: Three times a day (TID) | ORAL | Status: DC
  Administered 2018-11-22 – 2018-11-26 (×13): 1000 mg via ORAL
  Filled 2018-11-22 (×14): qty 2

## 2018-11-22 MED ORDER — SODIUM CHLORIDE 0.9 % IV SOLN: INTRAVENOUS | Status: DC | PRN

## 2018-11-22 MED ORDER — HYDROXYCHLOROQUINE 25 MG/ML ORAL SUSPENSION
400.0000 mg | Freq: Two times a day (BID) | Status: AC
  Administered 2018-11-23 (×2): 400 mg
  Filled 2018-11-22 (×2): qty 16

## 2018-11-22 MED ORDER — PRISMASOL BGK 0/2.5 32-2.5 MEQ/L IV SOLN
INTRAVENOUS | Status: DC
  Administered 2018-11-25 (×3): via INTRAVENOUS_CENTRAL
  Filled 2018-11-22 (×8): qty 5000

## 2018-11-22 MED ORDER — INSULIN ASPART 100 UNIT/ML ~~LOC~~ SOLN
0.0000 [IU] | SUBCUTANEOUS | Status: DC
  Administered 2018-11-23: 20:00:00 2 [IU] via SUBCUTANEOUS
  Administered 2018-11-23: 1 [IU] via SUBCUTANEOUS
  Administered 2018-11-23: 17:00:00 2 [IU] via SUBCUTANEOUS
  Administered 2018-11-23 – 2018-11-24 (×2): 3 [IU] via SUBCUTANEOUS
  Administered 2018-11-24: 20:00:00 5 [IU] via SUBCUTANEOUS
  Administered 2018-11-24: 3 [IU] via SUBCUTANEOUS
  Administered 2018-11-24: 05:00:00 2 [IU] via SUBCUTANEOUS
  Administered 2018-11-24: 11:00:00 3 [IU] via SUBCUTANEOUS
  Administered 2018-11-25 (×3): 5 [IU] via SUBCUTANEOUS

## 2018-11-22 MED ORDER — BISACODYL 10 MG RE SUPP: 10.0000 mg | Freq: Every day | RECTAL | Status: DC | PRN

## 2018-11-22 MED ORDER — ORAL CARE MOUTH RINSE
15.0000 mL | OROMUCOSAL | Status: DC
  Administered 2018-11-22 – 2018-11-26 (×37): 15 mL via OROMUCOSAL

## 2018-11-22 MED ORDER — HEPARIN SODIUM (PORCINE) 5000 UNIT/ML IJ SOLN
5000.0000 [IU] | Freq: Three times a day (TID) | INTRAMUSCULAR | Status: DC
  Administered 2018-11-22: 5000 [IU] via SUBCUTANEOUS
  Filled 2018-11-22: qty 1

## 2018-11-22 MED ORDER — PRISMASOL BGK 0/2.5 32-2.5 MEQ/L IV SOLN
INTRAVENOUS | Status: DC
  Administered 2018-11-23 – 2018-11-25 (×6): via INTRAVENOUS_CENTRAL
  Filled 2018-11-22 (×12): qty 5000

## 2018-11-22 MED ORDER — HYDROXYCHLOROQUINE 25 MG/ML ORAL SUSPENSION: 200.0000 mg | Freq: Two times a day (BID) | Status: DC

## 2018-11-22 MED ORDER — ASPIRIN EC 81 MG PO TBEC
81.0000 mg | DELAYED_RELEASE_TABLET | Freq: Every day | ORAL | Status: DC
  Administered 2018-11-22 – 2018-12-01 (×10): 81 mg via ORAL
  Filled 2018-11-22 (×10): qty 1

## 2018-11-22 MED ORDER — CHLORHEXIDINE GLUCONATE 0.12% ORAL RINSE (MEDLINE KIT)
15.0000 mL | Freq: Two times a day (BID) | OROMUCOSAL | Status: DC
  Administered 2018-11-23 – 2018-11-26 (×7): 15 mL via OROMUCOSAL

## 2018-11-22 MED ORDER — HYDRALAZINE HCL 20 MG/ML IJ SOLN
5.0000 mg | Freq: Four times a day (QID) | INTRAMUSCULAR | Status: DC | PRN
  Administered 2018-11-22: 20:00:00 5 mg via INTRAVENOUS
  Filled 2018-11-22: qty 1

## 2018-11-22 MED ORDER — SODIUM CHLORIDE 0.9% FLUSH
3.0000 mL | Freq: Once | INTRAVENOUS | Status: AC
  Administered 2018-11-22: 08:00:00 3 mL via INTRAVENOUS

## 2018-11-22 MED ORDER — DOCUSATE SODIUM 50 MG/5ML PO LIQD
100.0000 mg | Freq: Two times a day (BID) | ORAL | Status: DC | PRN
  Administered 2018-11-25 – 2018-11-26 (×3): 100 mg
  Filled 2018-11-22 (×3): qty 10

## 2018-11-22 MED ORDER — GABAPENTIN 300 MG PO CAPS
300.0000 mg | ORAL_CAPSULE | ORAL | Status: DC
  Administered 2018-11-22 – 2018-12-01 (×4): 300 mg via ORAL
  Filled 2018-11-22 (×5): qty 1

## 2018-11-22 MED ORDER — PROPOFOL 1000 MG/100ML IV EMUL
INTRAVENOUS | Status: AC
  Filled 2018-11-22: qty 100

## 2018-11-22 MED ORDER — PRISMASOL BGK 0/2.5 32-2.5 MEQ/L IV SOLN
INTRAVENOUS | Status: DC
  Administered 2018-11-23 – 2018-11-26 (×17): via INTRAVENOUS_CENTRAL
  Filled 2018-11-22 (×40): qty 5000

## 2018-11-22 MED ORDER — TRAMADOL HCL 50 MG PO TABS: 100.0000 mg | ORAL_TABLET | Freq: Two times a day (BID) | ORAL | Status: DC | PRN

## 2018-11-22 MED ORDER — FENTANYL CITRATE (PF) 100 MCG/2ML IJ SOLN
50.0000 ug | INTRAMUSCULAR | Status: DC | PRN
  Filled 2018-11-22: qty 2

## 2018-11-22 MED ORDER — RENA-VITE PO TABS
1.0000 | ORAL_TABLET | Freq: Every day | ORAL | Status: DC
  Administered 2018-11-23 – 2018-11-30 (×8): 1 via ORAL
  Filled 2018-11-22 (×9): qty 1

## 2018-11-22 MED ORDER — ROCURONIUM BROMIDE 50 MG/5ML IV SOLN
50.0000 mg | Freq: Once | INTRAVENOUS | Status: AC
  Administered 2018-11-22: 21:00:00 50 mg via INTRAVENOUS
  Filled 2018-11-22: qty 5

## 2018-11-22 MED ORDER — FENTANYL CITRATE (PF) 100 MCG/2ML IJ SOLN: 50.0000 ug | Freq: Once | INTRAMUSCULAR | Status: DC

## 2018-11-22 NOTE — Progress Notes (Signed)
Pharmacy Antibiotic Note  Blake Santana is a 48 y.o. male admitted on 11/22/2018 with pneumonia.  Pharmacy has been consulted for vancomycin and cefepime dosing. ESRD on HD TTS - last HD 4/18 PTA. Renal plans for patient to get HD either late tonight or early tomorrow AM.  Cefepime 1gm and vancomycin 1250 mg given in the ED this AM.   Plan: Change cefepime to 1g IV q24h for now Vancomycin 750 mg IV after each HD Monitor clinical progress, c/s, abx plan/LOT Pre-HD vancomycin level as indicated F/u HD schedule/tolerance inpatient for antibiotic maintenance doses  Height: 5\' 2"  (157.5 cm) Weight: 144 lb (65.3 kg)(from Jan 2020 records) IBW/kg (Calculated) : 54.6  Temp (24hrs), Avg:99.7 F (37.6 C), Min:98.7 F (37.1 C), Max:101.5 F (38.6 C)  Recent Labs  Lab 11/22/18 0244 11/22/18 0607 11/22/18 0730  WBC 11.4* 12.5*  --   CREATININE 10.49* 10.95*  --   LATICACIDVEN 1.0  --  0.9    Estimated Creatinine Clearance: 6.2 mL/min (A) (by C-G formula based on SCr of 10.95 mg/dL (H)).    No Known Allergies  Elicia Lamp, PharmD, BCPS Please check AMION for all [place] contact numbers Clinical Pharmacist 11/22/2018 8:47 PM

## 2018-11-22 NOTE — Procedures (Signed)
Central Venous Catheter Insertion Procedure Note Blake Santana [id number] 09-17-1970  Procedure: Insertion of Central Venous Catheter Indications: continuous renal replacement therapy  Procedure Details Consent: Unable to obtain consent because of emergent medical necessity. Time Out: Verified patient identification, verified procedure, site/side was marked, verified correct patient position, special equipment/implants available, medications/allergies/relevent history reviewed, required imaging and test results available.  Performed  Maximum sterile technique was used including antiseptics, cap, gloves, gown, hand hygiene, mask and sheet. Skin prep: Chlorhexidine; local anesthetic administered A antimicrobial bonded/coated triple lumen catheter was placed in the right internal jugular vein using the Seldinger technique.  Evaluation Blood flow good Complications: No apparent complications Patient did tolerate procedure well. Chest X-ray ordered to verify placement.  CXR: pending.  Blake Santana Blake Santana 11/22/2018, 10:38 PM

## 2018-11-22 NOTE — Progress Notes (Signed)
FPTS Interim Progress Note  S:Received page for RN that patient was acutely decompensating. Patient O2 need went from 4L [place] > 6L [place] > 6L HFNC > 15L NRB. Patient continued to desat and have increased WOB while on NRB. CCM (Dr. Valeta Harms) paged. Rapid response also paged. On arrival patient reported some difficulty breathing and back pain. Some accessory muscle use but otherwise no increased WOB. O2 sat of 100% on 15L NRB. Continued to stay 100% on 10L NRB. CCM evaluated patient and recommended transfer of care to CCM.   O: BP (!) 166/85 (BP Location: Right Arm)   Pulse 88   Temp 99.6 F (37.6 C) (Oral)   Resp (!) 22   Ht 5\' 2"  (1.575 m)   Wt 65.3 kg Comment: from Jan 2020 records  SpO2 100%   BMI 26.34 kg/m     A/P: #Acute hypoxemic respiratory failure #Bilateral pneumonia #PUI for COVID-19 Currently on NRB with escalated need for O2 and desats. Increased WOB with talking.  -Will transfer to [place], [place], DO 11/22/2018, 6:57 PM PGY-2, [place] [facility] Service pager [phone]

## 2018-11-22 NOTE — Progress Notes (Signed)
Arterial Gas drawn on 3lpm nasal cannula.

## 2018-11-22 NOTE — Progress Notes (Signed)
Received call from nurse about high blood pressure.   Patient to go to dialysis today, unsure at what time. Will hold on providing medications at this time and wait on dialysis and nephrology recommendations.   Zettie Cooley, M.D.  Family Medicine  PGY-1 11/22/2018 10:48 AM

## 2018-11-22 NOTE — Consult Note (Addendum)
[facility] Renal Consultation Note    Indication for Consultation:  Management of ESRD/hemodialysis, anemia, hypertension/volume, and secondary hyperparathyroidism. PCP:  HPI: Blake Santana is a 48 y.o. male with a history of T2DM, ESRD on dialysis, HTN, HLD, PNA, and CHF who presented to the ED with 1-2 days of chills, chest tightness and cough with mild SOB. He denies any sick contacts. Denied abdominal pain, headache, nausea or vomiting. On presentation to the ED, T 99.2, BP 197/93, Pulse 92 and SpO2 81%. Sats improved on O2 4L. COVID 19 test negative. However, per primary still high suspicion for COVID-19 and SARS-COV2 sent out. Chest x-ray revealed cardiomegaly with bilateral perihilar and lower lobe airspace opacities, consistent with edema/CHF or infection. Pneumonia was favored due to patient presentation and patient was started on cefepime and vancomycin. EKG also showed ST elevation and nonspecific ST changes but troponin was negative, cycling troponins per primary.   Patient normally dialyzes TTS at [facility]. He has been compliant with HD and last treatment was 11/19/2018, though he did leave 0.6kg above his EDW of 64.5kg.   Patient assessed in the setting of COVID-19 pandemic and was not physically evaluated by Anice Paganini, PA-C. History obtained from prior notes. Please see physical exam and assessment by Dr. Marval Regal.   Past Medical History:  Diagnosis Date  . Acute respiratory failure with hypoxia ([place]) 09/07/2018  . Anemia of chronic kidney failure 03/11/2017  . Chest pain 09/01/2018  . CHF (congestive heart failure) ([place])   . Chronic kidney disease 03/11/2017  . Diabetes mellitus without complication ([place]) 5638  . Edema 01/14/2017  . Headache   . Hypercholesterolemia   . Hypertension 03/11/2017  . Iron deficiency anemia 06/18/2016  . Pneumonia 09/07/2018  . RSV (respiratory syncytial virus infection) 08/2017   Past Surgical  History:  Procedure Laterality Date  . A/V FISTULAGRAM Left 01/19/2018   Procedure: A/V FISTULAGRAM - Left AV;  Surgeon: Waynetta Sandy, MD;  Location: [place] CV [facility];  Service: Cardiovascular;  Laterality: Left;  . AV FISTULA PLACEMENT Left 08/09/2017   Procedure: LEFT RADIOCEPHALIC ARTERIOVENOUS (AV) FISTULA CREATION;  Surgeon: [name], MD;  Location: [facility] OR;  Service: Vascular;  Laterality: Left;  . AV FISTULA PLACEMENT Left 02/02/2018   Procedure: ARTERIOVENOUS (AV) FISTULA CREATION LEFT UPPER EXTREMITY;  Surgeon: [name], MD;  Location: [place];  Service: Vascular;  Laterality: Left;  . INSERTION OF DIALYSIS CATHETER  08/09/2017  . INSERTION OF DIALYSIS CATHETER Right 08/09/2017   Procedure: INSERTION OF DIALYSIS CATHETER RIGHT INTERNAL JUGULAR;  Surgeon: [name], MD;  Location: [facility] OR;  Service: Vascular;  Laterality: Right;  . IR REMOVAL TUN CV CATH W/O FL  07/01/2018  . LEFT HEART CATH AND CORONARY ANGIOGRAPHY N/A 09/05/2018   Procedure: LEFT HEART CATH AND CORONARY ANGIOGRAPHY;  Surgeon: [name], MD;  Location: [place] CV LAB;  Service: Cardiovascular;  Laterality: N/A;   Family History  Problem Relation Age of Onset  . Diabetes Mother   . Kidney disease Mother        renal failure--diabetes, cause of death  . Hypertension Mother   . Diabetes Father   . Kidney disease Father        Cause of death:  kidney failure form diabetes  . Alcohol abuse Father   . Diabetes Sister    Social History:  reports that he has quit smoking. His smoking use included cigarettes. He has never used smokeless  tobacco. He reports previous alcohol use. He reports that he does not use drugs.  ROS: As per HPI otherwise negative.   Physical Exam: Vitals:   11/22/18 0230 11/22/18 0246 11/22/18 0344 11/22/18 1000  BP: (!) 199/93   (!) 177/84  Pulse: 92   88  Resp: (!) 21   (!) 21  Temp:  99.4 F (37.4 C)    TempSrc:  Rectal    SpO2: 97%   96%  Weight:   65.3  kg   Height:   _0  (1.575 m)      General: WD WN HM wearing oxygen via and in NAD Head: [place]/AT Lungs:  Bibasilar crackles Heart: RRR no rub Abdomen: +BS, soft, NT/ND Lower extremities: no edema  Dialysis Access:  LUE AVF +T/B  No Known Allergies Prior to Admission medications   Medication Sig Start Date End Date Taking? Authorizing Provider  amLODipine (NORVASC) 5 MG tablet Take 1 tablet (5 mg total) by mouth daily with breakfast. 11/10/18  Yes Mack Hook, MD  aspirin EC 81 MG EC tablet Take 1 tablet (81 mg total) by mouth daily. 09/08/18  Yes Mullis, Kiersten P, DO  b complex-vitamin c-folic acid (NEPHRO-VITE) 0.8 MG TABS tablet Take 1 tablet by mouth daily.  08/11/18  Yes [provider]  gabapentin (NEURONTIN) 300 MG capsule Take 1 capsule (300 mg total) by mouth Every Tuesday,Thursday,and Saturday with dialysis. 09/08/18  Yes Mullis, Kiersten P, DO  glipiZIDE (GLUCOTROL) 5 MG tablet 1 tab by mouth twice daily with meals Patient taking differently: Take 5 mg by mouth 2 (two) times daily with a meal.  10/22/17  Yes Mack Hook, MD  metoprolol tartrate (LOPRESSOR) 25 MG tablet Take 1 tablet (25 mg total) by mouth 2 (two) times daily with a meal. 09/07/18  Yes Mullis, Kiersten P, DO  rosuvastatin (CRESTOR) 20 MG tablet Take 1 tablet (20 mg total) by mouth daily at 6 PM. 10/17/18  Yes Mack Hook, MD  Blood Glucose Monitoring Suppl (AGAMATRIX PRESTO) w/Device KIT Check sugars twice daily 06/04/16   Mack Hook, MD  glucose blood (AGAMATRIX PRESTO TEST) test strip Check sugars twice daily 06/04/16   Mack Hook, MD   Current Facility-Administered Medications  Medication Dose Route Frequency Provider Last Rate Last Dose  . acetaminophen (TYLENOL) tablet 650 mg  650 mg Oral Q6H PRN Bonnita Hollow, MD       Or  . acetaminophen (TYLENOL) suppository 650 mg  650 mg Rectal Q6H PRN Bonnita Hollow, MD      . amLODipine (NORVASC) tablet 5 mg  5 mg Oral Q  breakfast Bonnita Hollow, MD   5 mg at 11/22/18 0824  . aspirin EC tablet 81 mg  81 mg Oral Daily Bonnita Hollow, MD   81 mg at 11/22/18 1132  . gabapentin (NEURONTIN) capsule 300 mg  300 mg Oral Q T,Th,Sa-HD Bonnita Hollow, MD   300 mg at 11/22/18 1132  . heparin injection 5,000 Units  5,000 Units Subcutaneous Q8H Josephine Igo B, MD      . insulin aspart (novoLOG) injection 0-9 Units  0-9 Units Subcutaneous TID WC Bonnita Hollow, MD   2 Units at 11/22/18 1131  . metoprolol tartrate (LOPRESSOR) tablet 25 mg  25 mg Oral BID WC Bonnita Hollow, MD   25 mg at 11/22/18 0824  . multivitamin (RENA-VIT) tablet 1 tablet  1 tablet Oral QHS Bonnita Hollow, MD      . rosuvastatin (CRESTOR)  tablet 20 mg  20 mg Oral q1800 Bonnita Hollow, MD      . traMADol Veatrice Bourbon) tablet 100 mg  100 mg Oral Q12H PRN Bonnita Hollow, MD      . vancomycin variable dose per unstable renal function (pharmacist dosing)   Does not apply See admin instructions Zenia Resides, MD       Labs: Basic Metabolic Panel: Recent Labs  Lab 11/22/18 0244 11/22/18 0247 11/22/18 0607  NA 140 138  --   K 5.4* 5.5*  --   CL 97*  --   --   CO2 29  --   --   GLUCOSE 135*  --   --   BUN 63*  --   --   CREATININE 10.49*  --  10.95*  CALCIUM 10.0  --   --    Liver Function Tests: Recent Labs  Lab 11/22/18 0244  AST 15  ALT 11  ALKPHOS 87  BILITOT 0.6  PROT 7.7  ALBUMIN 3.8   No results for input(s): LIPASE, AMYLASE in the last 168 hours. No results for input(s): AMMONIA in the last 168 hours. CBC: Recent Labs  Lab 11/22/18 0244 11/22/18 0247 11/22/18 0607  WBC 11.4*  --  12.5*  NEUTROABS 8.2*  --   --   HGB 11.0* 10.9* 10.3*  HCT 35.6* 32.0* 32.8*  MCV 94.9  --  95.6  PLT 163  --  144*   Cardiac Enzymes: Recent Labs  Lab 11/22/18 0243 11/22/18 0607 11/22/18 1149  TROPONINI 0.03* 0.04* 0.08*   CBG: Recent Labs  Lab 11/22/18 0237 11/22/18 1130  GLUCAP 127* 159*   Iron Studies:  No results for input(s): IRON, TIBC, TRANSFERRIN, FERRITIN in the last 72 hours. Studies/Results: Dg Chest Portable 1 View  Result Date: 11/22/2018 CLINICAL DATA:  Fever, chest pain, shortness of breath EXAM: PORTABLE CHEST 1 VIEW COMPARISON:  09/06/2018 FINDINGS: Bilateral airspace disease noted. Heart is borderline in size. No visible effusions. No acute bony abnormality. IMPRESSION: Borderline cardiomegaly with bilateral perihilar and lower lobe airspace opacities. Findings could reflect edema/CHF or infection. Electronically Signed   By: Rolm Baptise M.D.   On: 11/22/2018 03:12    Dialysis Orders: Center: [facility]  on TTS . 180NRe Optiflux; Time: 3:45 hr; BFR 400; DFR 800; EDW 64.5kg 2K/2 Ca Heparin 2000 unit bolus  Hectorol 2 mcg IV 3x week during HD Venofer 50 mg IV q week Velphoro 500 mg 2 tabs PO TID with meals  Recent outpatient lbs: 4/16 Hgb 11.5, Ca 9.7. 4/2: Phos 5.1  Assessment/Plan: 1.  Bilateral pneumonia: Patient febrile and requiring 4L O2 on presentation. Initial COVID-19 negative in the ED, however being treated as a PUI due to high suspicion. Currently on cefeprime and vancomycin, SARS-COV2 test being sent out.  2. Chest pain: Initial EKG with ST elevation/nonspecific ST changes however troponin was negative. On telemetry, primary cycling troponins.  3.  ESRD:  Normally dialyzes TTS, last HD on 11/19/2018. K+ 5.5.  4.  Hypertension/volume: BP elevated, due for HD today. CXR with evidence of pneumonia or pulmonary edema however no peripheral edema per ED notes.  5.  Anemia: Hgb 11.0, no ESA at this time.  6.  Metabolic bone disease: calcium 10,0, will hold outpatient hectorol at this time. Continue velphoro.  7.  Nutrition:  Albumin 3.8. Recommend renal diet.   Anice Paganini, PA-C 11/22/2018, 1:45 PM  [facility] Pager: [phone]  I have seen and  examined this patient and agree with plan and assessment in the above note with  renal recommendations/intervention highlighted.  Pt with probable infectious pulmonary process.  Initial covid-19 screen negative but have high clinical suspicion so will resend.  Cont with airborne precautions and will plan for Hd either tonight or first thing in am.  Will give lokelma for hyperkalemia as HD will likely be delayed.  Governor [place] Neetu Carrozza,MD 11/22/2018 4:51 PM

## 2018-11-22 NOTE — Procedures (Addendum)
Intubation Procedure Note Blake Santana [id number] 10-13-1970  Procedure: Intubation Indications: Respiratory insufficiency CONFIRMED COVID 19  on 15L  HFNC Sat 88% with Increased work of breathing  Procedure Details Consent: Unable to obtain consent because of emergent medical necessity. Time Out: Verified patient identification, verified procedure, site/side was marked, verified correct patient position, special equipment/implants available, medications/allergies/relevent history reviewed, required imaging and test results available.  Performed  Maximum sterile technique was used including antiseptics, cap, gloves, gown, hand hygiene and mask.  Pt in a negative pressure room Video assisted glideoscope used for intubation No respirations via AMBU only passive ventilation via HFNC prior to intubation RSI: 4 versed 50 fentanyl 10 Etomidate  Roc 50 MAC and 4  Evaluation Hemodynamic Status: BP stable throughout; O2 sats: transiently fell during during procedure Patient's Current Condition: stable Complications: No apparent complications Patient did tolerate procedure well. Chest X-ray ordered to verify placement.  CXR: pending.   Rise Paganini [place] 11/22/2018

## 2018-11-22 NOTE — ED Triage Notes (Signed)
Pt is T,Th, Sat dialysis pt. Last tx on Saturday and was completed.

## 2018-11-22 NOTE — Progress Notes (Signed)
Sputum culture collected and sent to the lab. 

## 2018-11-22 NOTE — Procedures (Signed)
Arterial Catheter Insertion Procedure Note Lambros Cerro [id number] 05/10/1971  Procedure: Insertion of Arterial Catheter  Indications: Frequent blood sampling, frequent ABGs  Procedure Details Consent: Unable to obtain consent because of altered level of consciousness. Time Out: Verified patient identification, verified procedure, site/side was marked, verified correct patient position, special equipment/implants available, medications/allergies/relevent history reviewed, required imaging and test results available.  Performed  Maximum sterile technique was used including antiseptics, cap, gloves, gown, hand hygiene, mask and sheet. Skin prep: Chlorhexidine; local anesthetic administered 20 gauge catheter was inserted into right radial artery using the Seldinger technique.  Biopatch and sterile dressing applied.  ULTRASOUND GUIDANCE USED: YES Evaluation Blood flow good; BP tracing good. Complications: No apparent complications.   Kennieth Rad, MSN, AGACNP-BC [facility] Pgr: [phone] or if no answer [phone] 11/22/2018, 10:37 PM

## 2018-11-22 NOTE — Progress Notes (Signed)
Pharmacy Antibiotic Note  Blake Santana is a 48 y.o. male admitted on 11/22/2018 with pneumonia.  Pharmacy has been consulted for vancomycin and cefepime dosing.Cefepime 1gm and vancomycin 1250 mg ordered in the ED  Plan: Continue cefepime 2gm IV after each HD Continue vancomycin 750 mg IV after each HD F/u HD plans  Height: 5\' 2"  (157.5 cm) Weight: 144 lb (65.3 kg)(from Jan 2020 records) IBW/kg (Calculated) : 54.6  Temp (24hrs), Avg:99.2 F (37.3 C), Min:98.7 F (37.1 C), Max:99.4 F (37.4 C)  Recent Labs  Lab 11/22/18 0244  WBC 11.4*  CREATININE 10.49*  LATICACIDVEN 1.0    Estimated Creatinine Clearance: 6.5 mL/min (A) (by C-G formula based on SCr of 10.49 mg/dL (H)).    No Known Allergies  Thank you for allowing pharmacy to be a part of this patient's care.  Blake Santana 11/22/2018 6:24 AM

## 2018-11-22 NOTE — Progress Notes (Signed)
eLink Physician-Brief Progress Note Patient Name: Blake Santana DOB: 1971/07/28 MRN: [id number]   Date of Service  11/22/2018  HPI/Events of Note  48 y/o M ESRD on HD, HTN, DM presented with worsening shortness of breath. COVID negative, now retested. Requiring 100% NRB mask. BP elevated SBP 190s to 200s. CXR pulm edema vs pneumonia  eICU Interventions   COVID testing repeated  Patient intubation planned  Ordered hydralazine prn but patient will need dialysis     Intervention Category Major Interventions: Respiratory failure - evaluation and management;Hypertension - evaluation and management Intermediate Interventions: Hypervolemia - evaluation and management Evaluation Type: New Patient Evaluation  Blake Santana 11/22/2018, 8:12 PM

## 2018-11-22 NOTE — ED Triage Notes (Signed)
C/O of chest pain and generalized body aches starting yesterday afternoon. With chills starting 2 hours ago. Pt denies being around anyone sick or recent travel. Temp 99.2.

## 2018-11-22 NOTE — Discharge Summary (Signed)
[facility] Discharge Summary  Patient name: Blake Santana Medical record number: [id number] Date of birth: Dec 06, 1970 Age: 48 y.o. Gender: male Date of Admission: 11/22/2018  Date of Discharge: 12/01/2018 Admitting Physician: Zenia Resides, MD  Primary Care Provider: Mack Hook, MD Consultants: Nephrology, Infectious Disease, CCM  Indication for Hospitalization: Respiratory distress due to COVID and bilateral pneumonia  Discharge Diagnoses/Problem List:  COVID infection CAD ESRD Type 2 diabetes HLD Heart failure with preserved ejection fraction Hypertension Iron deficiency anemia  Disposition: Home  Discharge Condition: Stable, improved  Discharge Exam: Evaluated by attending.   Brief Hospital Course:  Zain Lankford is a 48 y.o. male presenting with 4 days of SOB and chest pain. PMH is significant for  has a past medical history of AOCD, HFpEF, ESRD on HD, DMT2 HLD, HTN, CAD  Acute hypoxemic respiratory failure secondary to [place] Patient presenting with multiple days of fever, chills, cough, shortness of breath.  Desaturations to 80s requiring 4 L oxygen on presentation.  Chest x-ray on admission showing bilateral opacities concerning for pneumonia.  Patient also ESRD so there was concern for possible COVID-19.  Initial rapid COVID test negative.  Send out COVID test showing Positive. On day of admission, patient had worsening respiratory function.  Patient ultimately required critical care with intubation.  This occurred on 4/21.  Patient was extubated on 4/25.  Patient on admission was started on broad-spectrum antibiotics with cefepime and vancomycin (4/21-4/24). BNP elevated to 2559, showing likely component of volume overload.  Patient ESRD, required CRRT while intubated.   When extubated, patient was able to be slowly weaned from oxygen and was stable on room air by day of discharge.  He went back to intermittent HD when  successfully extubated.  On 4/27, ID was consulted to help organize his outpatient HD location due to his COVID+ and QuantGold+ status.   On 4/28, patient was noted to have buccal thrush and nystatin rinse was started. Incidental workup during hospitalization included positive Quantiferon gold test, however no signs or symptoms of TB throughout admission. ID recommendations stated that he is unlikely to have infectious TB given negative chest x-ray for cavitary lesion and that he did not require further work-up; however, if his cultures returned positive in the next 21 days, his plan will change.  A total of 1 sample of expectorated sputum were obtained during admission which was negative, cultures pending. Due to difficulty of obtaining sputum and contraindication for induction of sputum given COVID status, remaining two samples were unable to be obtained prior to discharge. However, patient was completely asymptomatic in regards to TB, thus low likelihood of active TB. Patient was discharged with recommended close follow up with PCP. Per Dr. Baxter Flattery (Infectious disease) patient does not need to follow up with health department or require any further work up at this time. PT also assessed patient and determined that patient did not require further PT follow-up after discharge.  It is recommended patient wear a mask in public and HD.  Due to his COVID positive status, he will require dialysis at [facility] dialysis center, and he is scheduled to begin his dialysis session on the third shift on Friday, May 1.  We ensured that his Medicaid would cover dialysis at the center.   Issues for Follow Up:   #Positive QuantiFERON gold.    Sputum AFB smear x1 negative.  AFB culture with sensitivities from trach aspirate pending.   Fungal cultures also pending.  Health dept  if AFB culture return positive in the next few weeks   #Hyponatremia:  Patient with ESRD, noted to have acute drop in sodium. Recommend  follow up labs to ensure resolution   Continued phosphate binder as per nephrology. Continue to monitor and determine need.  Significant Procedures: Intubation, extubation  Significant Labs and Imaging:  Recent Labs  Lab 11/29/18 0627 11/30/18 0641 12/01/18 0743  WBC 10.7* 11.3* 9.5  HGB 10.5* 10.2* 9.5*  HCT 31.0* 30.1* 27.8*  PLT 144* 174 185   Recent Labs  Lab 11/25/18 0334  11/26/18 0313  11/27/18 0319 11/28/18 0841 11/28/18 1016 11/30/18 0641 12/01/18 0744  NA 135   < > 135   < > 135 131* 132* 132* 130*  K 4.1   < > 3.0*   < > 4.6 4.5 3.1* 4.4 4.3  CL 100   < > 98   < > 97* 93* 95* 89* 86*  CO2 24   < > 24   < > 24 21* '23 26 26  ' GLUCOSE 254*   < > 284*   < > 133* 159* 178* 233* 138*  BUN 43*   < > 48*   < > 116* 157* 66* 126* 156*  CREATININE 2.49*   < > 1.91*   < > 4.87* 8.83* 3.97* 9.56* 12.33*  CALCIUM 8.7*   < > 9.0   < > 9.5 9.5 8.4* 9.5 9.1  MG 2.9*  --  2.8*  --  3.0*  --   --   --   --   PHOS 2.7   < > 2.2*   < > 3.2 3.7 1.6* 5.2* 5.8*  ALBUMIN 2.7*   < > 3.0*   < > 3.0* 2.9* 3.0* 3.2* 3.2*   < > = values in this interval not displayed.     Ref. Range 11/22/2018 02:43 11/22/2018 06:07 11/22/2018 10:18 11/22/2018 11:49  Troponin I Latest Ref Range: <0.03 ng/mL 0.03 (HH) 0.04 (HH)  0.08 (HH)    Results/Tests Pending at Time of Discharge:  Unresulted Labs (From admission, onward)    Start     Ordered   11/30/18 1108  Acid Fast Smear (AFB)  (AFB smear + Culture w reflexed sensitivities panel)  Once,   R    Comments:  Needs two samples of sputum    11/30/18 1109   11/30/18 1105  Acid Fast Culture with reflexed sensitivities  (AFB smear + Culture w reflexed sensitivities panel)  Once,   R     11/30/18 1109   11/25/18 1525  Acid Fast Culture with reflexed sensitivities  (AFB smear + Culture w reflexed sensitivities panel)  Once,   R    Question:  Patient immune status  Answer:  Normal   11/25/18 1525          Discharge Medications:  Allergies as of  12/01/2018   No Known Allergies     Medication List    TAKE these medications   AgaMatrix Presto w/Device Kit Check sugars twice daily   amLODipine 10 MG tablet Commonly known as:  NORVASC Take 1 tablet (10 mg total) by mouth daily. Start taking on:  Dec 02, 2018 What changed:    medication strength  how much to take  when to take this   aspirin 81 MG EC tablet Take 1 tablet (81 mg total) by mouth daily.   b complex-vitamin c-folic acid 0.8 MG Tabs tablet Take 1 tablet by mouth daily.   gabapentin 300  MG capsule Commonly known as:  NEURONTIN Take 1 capsule (300 mg total) by mouth Every Tuesday,Thursday,and Saturday with dialysis.   glipiZIDE 5 MG tablet Commonly known as:  GLUCOTROL 1 tab by mouth twice daily with meals What changed:    how much to take  how to take this  when to take this  additional instructions   glucose blood test strip Commonly known as:  AgaMatrix Presto Test Check sugars twice daily   metoprolol tartrate 25 MG tablet Commonly known as:  LOPRESSOR Take 1 tablet (25 mg total) by mouth 2 (two) times daily with a meal.   nystatin 100000 UNIT/ML suspension Commonly known as:  MYCOSTATIN Use as directed 5 mLs (500,000 Units total) in the mouth or throat 4 (four) times daily for 7 days.   rosuvastatin 20 MG tablet Commonly known as:  CRESTOR Take 1 tablet (20 mg total) by mouth daily at 6 PM.   sucroferric oxyhydroxide 500 MG chewable tablet Commonly known as:  VELPHORO Chew 2 tablets (1,000 mg total) by mouth 3 (three) times daily with meals.       Discharge Instructions: Please refer to Patient Instructions section of EMR for full details.  Patient was counseled important signs and symptoms that should prompt return to medical care, changes in medications, dietary instructions, activity restrictions, and follow up appointments.   Follow-Up Appointments: Follow-up [place] Follow up  on 12/07/2018.   Why:  Televisit with Juluis Mire NP: someone will call at time of appointment: 1:30pm Contact information: [place] [id number] [address], [place], DO 12/01/2018, 1:05 PM PGY-1, [place]

## 2018-11-22 NOTE — ED Notes (Signed)
O2 level 88% on 2 L, RN increased to 4L.  Provider bedside.  Pt reports sweats, chills, emesis.  Hx of kidney failure, has not missed dialysis, no hx of blood clots.  No home O2, does not smoke, no known exposure to illness

## 2018-11-22 NOTE — Progress Notes (Addendum)
Family Medicine Teaching Service Daily Progress Note Intern Pager: [phone]  Patient name: Blake Santana Medical record number: [id number] Date of birth: 1971/01/19 Age: 48 y.o. Gender: male  Primary Care Provider: Mack Hook, MD Consultants: Nephrology  Code Status: Full   Pt Overview and Major Events to Date:  Admitted to [facility] on 4/21  Assessment and Plan: Blake Santana is a 48 y.o. male presenting with 4 days of SOB and chest pain. PMH is significant for  has a past medical history of AOCD, HFpEF, ESRD on HD, DMT2 HLD, HTN, CAD  #Acute hypoxemic respiratory failure #Bilateral pneumonia #PUI for COVID-19 Currently on 3L [place]. On CXR bilateral opacities concerning for pneumonia.  This coupled with constellations of symptoms concerning for infectious source. Although initial COVID-19 was negative, patient has high probability based on immune deficiency from ESRD and presentation for COVID-19. LA wnl. Spoke to patient's outpatient HD, no COVID positive patients.   Continuous pulse ox  Supplemental oxygen  Continue broad-spectrum antibiotics cefepime and vancomycin (4/21-)   Airborne and contact precautions  Vital signs performed  Send out SARS-COV2  Consider imaging if no improvement    #Chest pain rule out #ST elevation #CAD Patient presenting with bilateral chest pain.  Likely from pneumonia.  Did have ST elevation and other nonspecific ST changes on EKG.  Initial troponin 0.03, patient with ESRD which will cause false elevation in troponin. Trops trended at 0.03>0.04. Recent Cath 09/2018 showing only 10% stenosis of LAD.   Cycle troponin  Cardiac telemetry  Low threshold to consult cardiology if elevated troponins  Repeat EKG  Continue ASA  #ESRD on HD Has not missed session.  Gets dialysis TTS. Nephrology consulted this morning for HD   Consult nephrology, appreciate recommendations  Daily weights  Strict I's and O's  #DMT2 with  neuropathy Hemoglobin A1c 7.4 on 08/2018.  Patient take glipizide at home.  Hold home glipizide  Sliding scale insulin sensitive with every 4 hour CBG checks  Repeat A1c  #HLD  Continue home rosuvastatin  #HFpEF EF 55-55% on 08/2018  Continue home metoprolol  Daily weights  Strict I's and O's  #HTN Patient hypertensive, BP 185/88.  Home medications include amlodipine and metoprolol.  Consult nephrology, appreciate recommendations  Continue home amlodipine and metoprolol  #AOCD #Iron deficiency anemia Hemoglobin stable at 11 on admission.  This is at baseline.  Monitor hemoglobin  FEN/GI: Renal/carb modified diet Prophylaxis: SQ Heparin  Disposition: continued inpatient monitoring   Subjective:  Attempted to call patient room, no response  Objective: Temp:  [98.7 F (37.1 C)-99.4 F (37.4 C)] 99.4 F (37.4 C) (04/21 0246) Pulse Rate:  [88-92] 88 (04/21 1000) Resp:  [21-26] 21 (04/21 1000) BP: (177-199)/(84-93) 177/84 (04/21 1000) SpO2:  [81 %-97 %] 96 % (04/21 1000) Weight:  [65.3 kg] 65.3 kg (04/21 0344) Physical exam by Dr. Ardelia Mems   Laboratory: Recent Labs  Lab 11/22/18 0244 11/22/18 0247 11/22/18 0607  WBC 11.4*  --  12.5*  HGB 11.0* 10.9* 10.3*  HCT 35.6* 32.0* 32.8*  PLT 163  --  144*   Recent Labs  Lab 11/22/18 0244 11/22/18 0247 11/22/18 0607  NA 140 138  --   K 5.4* 5.5*  --   CL 97*  --   --   CO2 29  --   --   BUN 63*  --   --   CREATININE 10.49*  --  10.95*  CALCIUM 10.0  --   --   PROT 7.7  --   --  BILITOT 0.6  --   --   ALKPHOS 87  --   --   ALT 11  --   --   AST 15  --   --   GLUCOSE 135*  --   --     Ref. Range 11/22/2018 07:30  Lactic Acid, Venous Latest Ref Range: 0.5 - 1.9 mmol/L 0.9    Ref. Range 11/22/2018 02:43 11/22/2018 06:07  Troponin I Latest Ref Range: <0.03 ng/mL 0.03 (HH) 0.04 (HH)    Imaging/Diagnostic Tests: Dg Chest Portable 1 View  Result Date: 11/22/2018 CLINICAL DATA:  Fever, chest  pain, shortness of breath EXAM: PORTABLE CHEST 1 VIEW COMPARISON:  09/06/2018 FINDINGS: Bilateral airspace disease noted. Heart is borderline in size. No visible effusions. No acute bony abnormality. IMPRESSION: Borderline cardiomegaly with bilateral perihilar and lower lobe airspace opacities. Findings could reflect edema/CHF or infection. Electronically Signed   By: Rolm Baptise M.D.   On: 11/22/2018 03:12     Caroline More, DO 11/22/2018, 12:09 PM PGY-2, [place] Intern pager: [phone], text pages welcome

## 2018-11-22 NOTE — Progress Notes (Signed)
FPTS Interim Progress Note  Spoke to Dr. Valeta Harms, CCM, regarding worsening respiratory status and increased need for O2. Per Dr. Valeta Harms will place patient on CCM list and to call if any further worsening. Recommended proning patient.   Caroline More, DO 11/22/2018, 5:27 PM PGY-2, [place] Medicine Service pager [phone]

## 2018-11-22 NOTE — ED Provider Notes (Signed)
[facility] EMERGENCY DEPARTMENT Provider Note   CSN: [id number] Arrival date & time: 11/22/18  0208    History   Chief Complaint Chief Complaint  Patient presents with   Chest Pain    HPI Blake Santana is a 48 y.o. male.     Patient with complicated medical history with t2dm, ESRD-HD, HTN, RSV (08/2017), HLD, PNA, CHF presents with 1-2- days of chills, sweating, chest tightness that radiates to back and cough. He feels mildly short of breath. His chest tightness and back discomfort ware worse when supine and not affected by activity. He goes to dialysis T, Th, Sat and has not missed any sessions. No sick contacts, recent travel. He only leaves his home when he goes to dialysis. No abdominal pain, headache, nausea or vomiting.   The history is provided by the patient. A language interpreter was used.    Past Medical History:  Diagnosis Date   Acute respiratory failure with hypoxia ([place]) 09/07/2018   Anemia of chronic kidney failure 03/11/2017   Chest pain 09/01/2018   CHF (congestive heart failure) ([place])    Chronic kidney disease 03/11/2017   Diabetes mellitus without complication ([place]) 0355   Edema 01/14/2017   Headache    Hypercholesterolemia    Hypertension 03/11/2017   Iron deficiency anemia 06/18/2016   Pneumonia 09/07/2018   RSV (respiratory syncytial virus infection) 08/2017    Patient Active Problem List   Diagnosis Date Noted   Hypoxia    Abnormal EKG    Volume overload 09/02/2018   Pleural effusion, bilateral 09/02/2018   Atypical chest pain 09/01/2018   Acute respiratory failure with hypoxia ([place]) 09/01/2018   Flash pulmonary edema ([place]) 09/01/2018   Community acquired pneumonia    Elevated troponin    Malnutrition of moderate degree 08/11/2017   RSV (respiratory syncytial virus infection) 08/09/2017   ESRD (end stage renal disease) ([place]) 08/08/2017   Acute exacerbation of CHF (congestive heart failure)  ([place]) 04/03/2017   Anemia of chronic kidney failure 03/11/2017   Hypertension 03/11/2017   Chronic kidney disease 03/11/2017   Edema 01/14/2017   Dyslipidemia 10/15/2016   Diabetic retinopathy ([place]) 10/15/2016   Microalbuminuria 06/18/2016   Normocytic anemia 06/18/2016   Iron deficiency anemia 06/18/2016   Diabetes mellitus with ESRD (end-stage renal disease) ([place]) 10/20/2001    Past Surgical History:  Procedure Laterality Date   A/V FISTULAGRAM Left 01/19/2018   Procedure: A/V FISTULAGRAM - Left AV;  Surgeon: Waynetta Sandy, MD;  Location: [facility];  Service: Cardiovascular;  Laterality: Left;   AV FISTULA PLACEMENT Left 08/09/2017   Procedure: LEFT RADIOCEPHALIC ARTERIOVENOUS (AV) FISTULA CREATION;  Surgeon: [name], MD;  Location: [facility];  Service: Vascular;  Laterality: Left;   AV FISTULA PLACEMENT Left 02/02/2018   Procedure: ARTERIOVENOUS (AV) FISTULA CREATION LEFT UPPER EXTREMITY;  Surgeon: [name], MD;  Location: [facility];  Service: Vascular;  Laterality: Left;   INSERTION OF DIALYSIS CATHETER  08/09/2017   INSERTION OF DIALYSIS CATHETER Right 08/09/2017   Procedure: INSERTION OF DIALYSIS CATHETER RIGHT INTERNAL JUGULAR;  Surgeon: [name], MD;  Location: [facility];  Service: Vascular;  Laterality: Right;   IR REMOVAL TUN CV CATH W/O FL  07/01/2018   LEFT HEART CATH AND CORONARY ANGIOGRAPHY N/A 09/05/2018   Procedure: LEFT HEART CATH AND CORONARY ANGIOGRAPHY;  Surgeon: [name], MD;  Location: [facility];  Service: Cardiovascular;  Laterality: N/A;  Home Medications    Prior to Admission medications   Medication Sig Start Date End Date Taking? Authorizing Provider  amLODipine (NORVASC) 5 MG tablet Take 1 tablet (5 mg total) by mouth daily with breakfast. 11/10/18   Mack Hook, MD  aspirin EC 81 MG EC tablet Take 1 tablet (81 mg total) by mouth daily. 09/08/18   Mullis, Kiersten P, DO  b complex-vitamin  c-folic acid (NEPHRO-VITE) 0.8 MG TABS tablet Take 1 tablet by mouth daily.  08/11/18   [provider]  Blood Glucose Monitoring Suppl (AGAMATRIX PRESTO) w/Device KIT Check sugars twice daily 06/04/16   Mack Hook, MD  gabapentin (NEURONTIN) 300 MG capsule Take 1 capsule (300 mg total) by mouth Every Tuesday,Thursday,and Saturday with dialysis. 09/08/18   Mullis, Kiersten P, DO  glipiZIDE (GLUCOTROL) 5 MG tablet 1 tab by mouth twice daily with meals Patient taking differently: Take 5 mg by mouth 2 (two) times daily with a meal.  10/22/17   Mack Hook, MD  glucose blood (AGAMATRIX PRESTO TEST) test strip Check sugars twice daily 06/04/16   Mack Hook, MD  metoprolol tartrate (LOPRESSOR) 25 MG tablet Take 1 tablet (25 mg total) by mouth 2 (two) times daily with a meal. 09/07/18   Mullis, Kiersten P, DO  rosuvastatin (CRESTOR) 20 MG tablet Take 1 tablet (20 mg total) by mouth daily at 6 PM. 10/17/18   Mack Hook, MD  sucroferric oxyhydroxide (VELPHORO) 500 MG chewable tablet Chew 500 mg by mouth 3 (three) times daily with meals.    [provider]    Family History Family History  Problem Relation Age of Onset   Diabetes Mother    Kidney disease Mother        renal failure--diabetes, cause of death   Hypertension Mother    Diabetes Father    Kidney disease Father        Cause of death:  kidney failure form diabetes   Alcohol abuse Father    Diabetes Sister     Social History Social History   Tobacco Use   Smoking status: Former Smoker    Types: Cigarettes   Smokeless tobacco: Never Used  Substance Use Topics   Alcohol use: Not Currently   Drug use: No     Allergies   Patient has no known allergies.   Review of Systems Review of Systems  Constitutional: Negative for chills.  HENT: Negative.  Negative for sore throat.   Respiratory: Positive for cough, chest tightness and shortness of breath.   Cardiovascular: Negative.   Negative for leg swelling.  Gastrointestinal: Negative.  Negative for abdominal pain, nausea and vomiting.  Musculoskeletal: Positive for myalgias.  Skin: Negative.  Negative for rash.  Neurological: Negative.  Negative for syncope, light-headedness and headaches.     Physical Exam Updated Vital Signs BP (!) 197/93 (BP Location: Right Arm)    Pulse 92    Temp 99.2 F (37.3 C)    Resp (!) 26    SpO2 (!) 81%   Physical Exam Vitals signs and nursing note reviewed.  Constitutional:      General: He is not in acute distress.    Appearance: He is well-developed. He is not toxic-appearing.  Neck:     Musculoskeletal: Normal range of motion and neck supple.  Cardiovascular:     Rate and Rhythm: Regular rhythm. Tachycardia present.     Heart sounds: No murmur.  Pulmonary:     Effort: Tachypnea (Mildly tachypneic.) present. No respiratory distress.  Breath sounds: No stridor. Examination of the right-lower field reveals rales. Examination of the left-lower field reveals rales. Rales present. No wheezing or rhonchi.  Abdominal:     Palpations: Abdomen is soft.     Tenderness: There is no abdominal tenderness.  Musculoskeletal:     Right lower leg: No edema.     Left lower leg: No edema.  Skin:    General: Skin is warm and dry.     Coloration: Skin is not cyanotic.  Neurological:     General: No focal deficit present.     Mental Status: He is alert and oriented to person, place, and time.      ED Treatments / Results  Labs (all labs ordered are listed, but only abnormal results are displayed) Labs Reviewed  CBG MONITORING, ED - Abnormal; Notable for the following components:      Result Value   Glucose-Capillary 127 (*)    All other components within normal limits  CULTURE, BLOOD (ROUTINE X 2)  CULTURE, BLOOD (ROUTINE X 2)  SARS CORONAVIRUS 2 (HOSPITAL ORDER, [facility])  TROPONIN I  CBC WITH DIFFERENTIAL/PLATELET  COMPREHENSIVE METABOLIC  PANEL  BLOOD GAS, ARTERIAL  LACTIC ACID, PLASMA  LACTIC ACID, PLASMA    EKG EKG Interpretation  Date/Time:  Tuesday November 22 2018 02:18:27 EDT Ventricular Rate:  93 PR Interval:    QRS Duration: 96 QT Interval:  396 QTC Calculation: 493 R Axis:   115 Text Interpretation:  Sinus rhythm Biatrial enlargement Left posterior fascicular block Consider left ventricular hypertrophy Nonspecific T abnormalities, lateral leads ST elevation, consider anterior injury Borderline prolonged QT interval Confirmed by Pryor Curia [phone]) on 11/22/2018 2:24:38 AM   Radiology No results found.  Procedures Procedures (including critical care time) CRITICAL CARE Performed by: Dewaine Oats   Total critical care time: 55 minutes  Critical care time was exclusive of separately billable procedures and treating other patients.  Critical care was necessary to treat or prevent imminent or life-threatening deterioration.  Critical care was time spent personally by me on the following activities: development of treatment plan with patient and/or surrogate as well as nursing, discussions with consultants, evaluation of patient's response to treatment, examination of patient, obtaining history from patient or surrogate, ordering and performing treatments and interventions, ordering and review of laboratory studies, ordering and review of radiographic studies, pulse oximetry and re-evaluation of patient's condition.  Medications Ordered in ED Medications  sodium chloride flush (NS) 0.9 % injection 3 mL (has no administration in time range)     Initial Impression / Assessment and Plan / ED Course  I have reviewed the triage vital signs and the nursing notes.  Pertinent labs & imaging results that were available during my care of the patient were reviewed by me and considered in my medical decision making (see chart for details).  Blake Santana was evaluated in the Emergency Department on 11/22/18  for the symptoms described in the history of present illness. He was evaluated in the context of the global COVID-19 pandemic, which necessitated consideration that the patient might be at risk for infection with the SARS-CoV-2 virus that causes COVID-19. Institutional protocols and algorithms that pertain to the evaluation of patients at risk for COVID-19 are in a state of rapid change based on information released by regulatory bodies including the CDC and federal and state organizations. These policies and algorithms were followed during the patient' scare in the ED.  Patient with complicated medical history presents with symptoms of chills, body aches, chest tightness, cough x 1-2 days. No N, V.  On arrival the patient is found to be significantly hypoxic at 81%. He is maintaining 96% on 4L. There are no wheezes. He does not appear in respiratory distress. He is speaking in full sentences.   COVID precautions taken. DDx: COVID-19 vs PNA (h/o PNA with hypoxic respiratory failure in 09/2018) vs less likely CAD given febrile illness.   Review of chart shows he had a cardiac catheterization in February 2020 after he had elevated troponin 2.45. No significant CAD on cath. Echo with normal EF per notes of 09/06/18.  4:45 - COVID still pending. CXR shows findings concerning for PNA vs edema. He has not missed any of his dialysis sessions. There is no peripheral edema. Favor PNA. Abx for HCAP started.  Patient's COVID test negative. Consult for admission started. Family Practice to admit.   Final Clinical Impressions(s) / ED Diagnoses   Final diagnoses:  None   1. Pneumonia 2. Respiratory failure with hypoxia   ED Discharge Orders    None       Charlann Lange, PA-C 11/22/18 0612    Ward, Delice Bison, DO 11/22/18 [phone]

## 2018-11-22 NOTE — ED Notes (Signed)
ED TO INPATIENT HANDOFF REPORT  ED Nurse Name and Phone #: Tray [place], [place] Name/Age/Gender Gar Ponto 48 y.o. male Room/Bed: 023C/023C  Code Status   Code Status: Full Code  Home/SNF/Other Home Patient oriented to: self, place, time and situation Is this baseline? Yes   Triage Complete: Triage complete  Chief Complaint back and chest pain  Triage Note C/O of chest pain and generalized body aches starting yesterday afternoon. With chills starting 2 hours ago. Pt denies being around anyone sick or recent travel. Temp 99.2.   Pt is T,Th, Sat dialysis pt. Last tx on Saturday and was completed.   Allergies No Known Allergies  Level of Care/Admitting Diagnosis ED Disposition    ED Disposition Condition [facility]: [place] [[id number]]  Level of Care: Telemetry Medical [104]  Covid Evaluation: Person Under Investigation (PUI)  Isolation Risk Level: High Risk (Aerosolizing procedure, nebulizer, intubated/ventilation, CPAP/BiPAP)  Diagnosis: HCAP (healthcare-associated pneumonia) [481856]  Admitting Physician: Zenia Resides [[id number]]  Attending Physician: Zenia Resides [[id number]]  Estimated length of stay: past midnight tomorrow  Certification:: I certify this patient will need inpatient services for at least 2 midnights  PT Class (Do Not Modify): Inpatient [101]  PT Acc Code (Do Not Modify): Private [1]       B Medical/Surgery History Past Medical History:  Diagnosis Date  . Acute respiratory failure with hypoxia ([place]) 09/07/2018  . Anemia of chronic kidney failure 03/11/2017  . Chest pain 09/01/2018  . CHF (congestive heart failure) ([place])   . Chronic kidney disease 03/11/2017  . Diabetes mellitus without complication ([place]) 3149  . Edema 01/14/2017  . Headache   . Hypercholesterolemia   . Hypertension 03/11/2017  . Iron deficiency anemia 06/18/2016  . Pneumonia 09/07/2018  . RSV (respiratory syncytial virus  infection) 08/2017   Past Surgical History:  Procedure Laterality Date  . A/V FISTULAGRAM Left 01/19/2018   Procedure: A/V FISTULAGRAM - Left AV;  Surgeon: Waynetta Sandy, MD;  Location: [facility];  Service: Cardiovascular;  Laterality: Left;  . AV FISTULA PLACEMENT Left 08/09/2017   Procedure: LEFT RADIOCEPHALIC ARTERIOVENOUS (AV) FISTULA CREATION;  Surgeon: [name], MD;  Location: [facility] OR;  Service: Vascular;  Laterality: Left;  . AV FISTULA PLACEMENT Left 02/02/2018   Procedure: ARTERIOVENOUS (AV) FISTULA CREATION LEFT UPPER EXTREMITY;  Surgeon: [name], MD;  Location: [facility];  Service: Vascular;  Laterality: Left;  . INSERTION OF DIALYSIS CATHETER  08/09/2017  . INSERTION OF DIALYSIS CATHETER Right 08/09/2017   Procedure: INSERTION OF DIALYSIS CATHETER RIGHT INTERNAL JUGULAR;  Surgeon: [name], MD;  Location: [facility] OR;  Service: Vascular;  Laterality: Right;  . IR REMOVAL TUN CV CATH W/O FL  07/01/2018  . LEFT HEART CATH AND CORONARY ANGIOGRAPHY N/A 09/05/2018   Procedure: LEFT HEART CATH AND CORONARY ANGIOGRAPHY;  Surgeon: [name], MD;  Location: [facility];  Service: Cardiovascular;  Laterality: N/A;     A IV Location/Drains/Wounds Patient Lines/Drains/Airways Status   Active Line/Drains/Airways    Name:   Placement date:   Placement time:   Site:   Days:   Peripheral IV 11/22/18 Right Forearm   11/22/18    0439    Forearm   less than 1   Fistula / Graft Left Forearm Arteriovenous fistula   08/09/17    1616    Forearm   470   Fistula / Graft Left Upper  arm Arteriovenous fistula   02/02/18    1138    Upper arm   293   Incision (Closed) 08/09/17 Chest Right   08/09/17    1545     470   Incision (Closed) 08/09/17 Arm Left   08/09/17    1545     470   Incision (Closed) 02/02/18 Arm Left   02/02/18    1152     293   Wound / Incision (Open or Dehisced) 04/07/17 Puncture Flank Left   04/07/17    1150    Flank   594          Intake/Output  Last 24 hours  Intake/Output Summary (Last 24 hours) at 11/22/2018 0703 Last data filed at 11/22/2018 1448 Gross per 24 hour  Intake 99.84 ml  Output -  Net 99.84 ml    Labs/Imaging Results for orders placed or performed during the hospital encounter of 11/22/18 (from the past 48 hour(s))  CBG monitoring, ED     Status: Abnormal   Collection Time: 11/22/18  2:37 AM  Result Value Ref Range   Glucose-Capillary 127 (H) 70 - 99 mg/dL  Troponin I - ONCE - STAT     Status: Abnormal   Collection Time: 11/22/18  2:43 AM  Result Value Ref Range   Troponin I 0.03 (HH) <0.03 ng/mL    Comment: CRITICAL RESULT CALLED TO, READ BACK BY AND VERIFIED WITH: Berry Gallacher,T RN 11/22/2018 0354 JORDANS Performed at [facility], [place] [address], [place], [place] [address]   CBC with Differential     Status: Abnormal   Collection Time: 11/22/18  2:44 AM  Result Value Ref Range   WBC 11.4 (H) 4.0 - 10.5 K/uL   RBC 3.75 (L) 4.22 - 5.81 MIL/uL   Hemoglobin 11.0 (L) 13.0 - 17.0 g/dL   HCT 35.6 (L) 39.0 - 52.0 %   MCV 94.9 80.0 - 100.0 fL   MCH 29.3 26.0 - 34.0 pg   MCHC 30.9 30.0 - 36.0 g/dL   RDW 14.0 11.5 - 15.5 %   Platelets 163 150 - 400 K/uL   Neutrophils Relative % 72 %   Neutro Abs 8.2 (H) 1.7 - 7.7 K/uL   Lymphocytes Relative 16 %   Lymphs Abs 1.8 0.7 - 4.0 K/uL   Monocytes Relative 6 %   Monocytes Absolute 0.7 0.1 - 1.0 K/uL   Eosinophils Relative 5 %   Eosinophils Absolute 0.6 (H) 0.0 - 0.5 K/uL   Basophils Relative 1 %   Basophils Absolute 0.1 0.0 - 0.1 K/uL    Comment: Performed at [facility], [address] [place], [place] [address]  Comprehensive metabolic panel     Status: Abnormal   Collection Time: 11/22/18  2:44 AM  Result Value Ref Range   Sodium 140 135 - 145 mmol/L   Potassium 5.4 (H) 3.5 - 5.1 mmol/L   Chloride 97 (L) 98 - 111 mmol/L   CO2 29 22 - 32 mmol/L   Glucose, Bld 135 (H) 70 - 99 mg/dL   BUN 63 (H) 6 - 20 mg/dL   Creatinine, Ser 10.49 (H) 0.61 - 1.24  mg/dL   Calcium 10.0 8.9 - 10.3 mg/dL   Total Protein 7.7 6.5 - 8.1 g/dL   Albumin 3.8 3.5 - 5.0 g/dL   AST 15 15 - 41 U/L   ALT 11 0 - 44 U/L   Alkaline Phosphatase 87 38 - 126 U/L   Total Bilirubin  0.6 0.3 - 1.2 mg/dL   GFR calc non Af Amer 5 (L) >60 mL/min   GFR calc Af Amer 6 (L) >60 mL/min   Anion gap 14 5 - 15    Comment: Performed at [facility] [address] [place], [place] [address]  Lactic acid, plasma     Status: None   Collection Time: 11/22/18  2:44 AM  Result Value Ref Range   Lactic Acid, Venous 1.0 0.5 - 1.9 mmol/L    Comment: Performed at [facility] [address] [place], [place] [address]  I-STAT 7, (LYTES, BLD GAS, ICA, H+H)     Status: Abnormal   Collection Time: 11/22/18  2:47 AM  Result Value Ref Range   pH, Arterial 7.465 (H) 7.350 - 7.450   pCO2 arterial 43.1 32.0 - 48.0 mmHg   pO2, Arterial 63.0 (L) 83.0 - 108.0 mmHg   Bicarbonate 30.9 (H) 20.0 - 28.0 mmol/L   TCO2 32 22 - 32 mmol/L   O2 Saturation 92.0 %   Acid-Base Excess 7.0 (H) 0.0 - 2.0 mmol/L   Sodium 138 135 - 145 mmol/L   Potassium 5.5 (H) 3.5 - 5.1 mmol/L   Calcium, Ion 1.24 1.15 - 1.40 mmol/L   HCT 32.0 (L) 39.0 - 52.0 %   Hemoglobin 10.9 (L) 13.0 - 17.0 g/dL   Patient temperature 99.4 F    Collection site RADIAL, ALLEN'S TEST ACCEPTABLE    Drawn by RT    Sample type ARTERIAL   SARS Coronavirus 2 [facility] order, Performed in [place] hospital lab)     Status: None   Collection Time: 11/22/18  3:28 AM  Result Value Ref Range   SARS Coronavirus 2 NEGATIVE NEGATIVE    Comment: (NOTE) If result is NEGATIVE SARS-CoV-2 target nucleic acids are NOT DETECTED. The SARS-CoV-2 RNA is generally detectable in upper and lower  respiratory specimens during the acute phase of infection. The lowest  concentration of SARS-CoV-2 viral copies this assay can detect is 250  copies / mL. A negative result does not preclude SARS-CoV-2 infection  and should not be used as the sole  basis for treatment or other  patient management decisions.  A negative result may occur with  improper specimen collection / handling, submission of specimen other  than nasopharyngeal swab, presence of viral mutation(s) within the  areas targeted by this assay, and inadequate number of viral copies  (<250 copies / mL). A negative result must be combined with clinical  observations, patient history, and epidemiological information. If result is POSITIVE SARS-CoV-2 target nucleic acids are DETECTED. The SARS-CoV-2 RNA is generally detectable in upper and lower  respiratory specimens dur ing the acute phase of infection.  Positive  results are indicative of active infection with SARS-CoV-2.  Clinical  correlation with patient history and other diagnostic information is  necessary to determine patient infection status.  Positive results do  not rule out bacterial infection or co-infection with other viruses. If result is PRESUMPTIVE POSTIVE SARS-CoV-2 nucleic acids MAY BE PRESENT.   A presumptive positive result was obtained on the submitted specimen  and confirmed on repeat testing.  While 2019 novel coronavirus  (SARS-CoV-2) nucleic acids may be present in the submitted sample  additional confirmatory testing may be necessary for epidemiological  and / or clinical management purposes  to differentiate between  SARS-CoV-2 and other Sarbecovirus currently known to infect humans.  If clinically indicated additional testing with an alternate test  methodology (  [id number]) is advised. The SARS-CoV-2 RNA is generally  detectable in upper and lower respiratory sp ecimens during the acute  phase of infection. The expected result is Negative. Fact Sheet for Patients:  StrictlyIdeas.no Fact Sheet for Healthcare Providers: BankingDealers.co.za This test is not yet approved or cleared by the [place] FDA and has been authorized for detection  and/or diagnosis of SARS-CoV-2 by FDA under an Emergency Use Authorization (EUA).  This EUA will remain in effect (meaning this test can be used) for the duration of the COVID-19 declaration under Section 564(b)(1) of the Act, 21 U.S.C. section 360bbb-3(b)(1), unless the authorization is terminated or revoked sooner. Performed at [facility], [place] [address], [place], [place] [address]   POC occult blood, ED     Status: None   Collection Time: 11/22/18  3:48 AM  Result Value Ref Range   Fecal Occult Bld NEGATIVE NEGATIVE   Dg Chest Portable 1 View  Result Date: 11/22/2018 CLINICAL DATA:  Fever, chest pain, shortness of breath EXAM: PORTABLE CHEST 1 VIEW COMPARISON:  09/06/2018 FINDINGS: Bilateral airspace disease noted. Heart is borderline in size. No visible effusions. No acute bony abnormality. IMPRESSION: Borderline cardiomegaly with bilateral perihilar and lower lobe airspace opacities. Findings could reflect edema/CHF or infection. Electronically Signed   By: Rolm Baptise M.D.   On: 11/22/2018 03:12    Pending Labs Unresulted Labs (From admission, onward)    Start     Ordered   11/23/18 2951  Basic metabolic panel  Tomorrow morning,   R     11/22/18 0613   11/23/18 0500  CBC  Tomorrow morning,   R     11/22/18 8841   11/22/18 0617  Lipid panel  Once,   R     11/22/18 0616   11/22/18 0614  Troponin I - Now Then Q6H  Now then every 6 hours,   R     11/22/18 0614   11/22/18 0612  Novel Coronavirus, NAA (hospital order; send-out to ref lab)  (Novel Coronavirus, NAA ([place]) with precautions panel)  Once,   R    Question Answer Comment  Current symptoms Fever and Cough   Excluded other viral illnesses Yes   Exposure Risk None   Patient immune status Immunocompromised      11/22/18 0613   11/22/18 0610  TSH  Once,   R     11/22/18 0613   11/22/18 0610  Hemoglobin A1c  Once,   R     11/22/18 0613   11/22/18 0607  CBC  (heparin)  Once,   R    Comments:  Baseline  for heparin therapy IF NOT ALREADY DRAWN.  Notify MD if PLT < 100 K.    11/22/18 6606   11/22/18 0607  Creatinine, serum  (heparin)  Once,   R    Comments:  Baseline for heparin therapy IF NOT ALREADY DRAWN.    11/22/18 0613   11/22/18 0231  Lactic acid, plasma  Now then every 2 hours,   STAT     11/22/18 0230   11/22/18 0230  Blood gas, arterial  ONCE - STAT,   STAT    Question:  Room air or oxygen  Answer:  Oxygen   11/22/18 0229   11/22/18 0228  Blood culture (routine x 2)  BLOOD CULTURE X 2,   STAT     11/22/18 0228          Vitals/Pain Today's Vitals   11/22/18 0220  11/22/18 0230 11/22/18 0246 11/22/18 0344  BP: (!) 197/93 (!) 199/93    Pulse: 92 92    Resp: (!) 26 (!) 21    Temp: 99.2 F (37.3 C)  99.4 F (37.4 C)   TempSrc:   Rectal   SpO2: (!) 81% 97%    Weight:    65.3 kg  Height:    5\' 2"  (1.575 m)  PainSc: 8        Isolation Precautions Airborne and Contact precautions  Medications Medications  sodium chloride flush (NS) 0.9 % injection 3 mL (has no administration in time range)  aspirin EC tablet 81 mg (has no administration in time range)  amLODipine (NORVASC) tablet 5 mg (has no administration in time range)  metoprolol tartrate (LOPRESSOR) tablet 25 mg (has no administration in time range)  rosuvastatin (CRESTOR) tablet 20 mg (has no administration in time range)  gabapentin (NEURONTIN) capsule 300 mg (has no administration in time range)  multivitamin (RENA-VIT) tablet 1 tablet (has no administration in time range)  heparin injection 5,000 Units (has no administration in time range)  acetaminophen (TYLENOL) tablet 650 mg (has no administration in time range)    Or  acetaminophen (TYLENOL) suppository 650 mg (has no administration in time range)  traMADol (ULTRAM) tablet 100 mg (has no administration in time range)  insulin aspart (novoLOG) injection 0-9 Units (has no administration in time range)  vancomycin variable dose per unstable renal  function (pharmacist dosing) (has no administration in time range)  ceFEPIme (MAXIPIME) 1 g in sodium chloride 0.9 % 100 mL IVPB (0 g Intravenous Stopped 11/22/18 0511)  vancomycin (VANCOCIN) 1,250 mg in sodium chloride 0.9 % 250 mL IVPB (1,250 mg Intravenous New Bag/Given 11/22/18 0524)    Mobility walks Low fall risk   Focused Assessments Cardiac Assessment Handoff:  Cardiac Rhythm: Normal sinus rhythm Lab Results  Component Value Date   TROPONINI 0.03 (HH) 11/22/2018   No results found for: DDIMER Does the Patient currently have chest pain? No     R Recommendations: See Admitting Provider Note  Report given to:   Additional Notes:

## 2018-11-22 NOTE — Significant Event (Addendum)
Rapid Response Event Note  Overview: Respiratory Distress   Initial Focused Assessment: RN called about patient requiring more oxygen, now requiring 15L NRB and in distress. Upon arrival IMTS MD and PCCM NP arrived as well. When the IMTS MD and I walked in, patient just finished talking on the phone with his wife. Patient was not in acute distress. Very mild tachypnea at times, skin very warm to touch, 100% on 15L NRB. I attempted to wean the oxygen and after several minutes, patient oxygen saturations did drop 78% so oxygen was increased back to 15L NRB and saturations improved to 92%.   Interventions: - No RRT Interventions  Plan of Care: - Patient was transferred to 2M14.  Event Summary:  Call Time [place] 7308 End Time 1940  Lidia Clavijo R

## 2018-11-22 NOTE — H&P (Signed)
[facility] Admission History and Physical Service Pager: [phone]  Patient name: Blake Santana Medical record number: [id number] Date of birth: 28-Dec-1970 Age: 48 y.o. Gender: male  Primary Care Provider: Mack Hook, MD Consultants: None Code Status:   Code Status: Full Code   Chief Complaint: SOB  Assessment and Plan: Amman Bartel is a 48 y.o. male presenting with 4 days of SOB and chest pain. PMH is significant for  has a past medical history of AOCD, HFpEF, ESRD on HD, DMT2 HLD, HTN, CAD  #Acute hypoxemic respiratory failure #Bilateral pneumonia #PUI for COVID-19 Patient presenting with new oxygen requirement up to 4 L.  On CXR bilateral opacities concerning for pneumonia.  This coupled with constellations of symptoms concerning for infectious source. Although initial COVID-19 was negative, patient has high probability based on immune deficiency from ESRD and presentation for COVID-19.  Admit to FPTS 1, telemetry, attending Dr. Andria Frames  Continuous pulse ox  Supplemental oxygen  Continue broad-spectrum antibiotics cefepime and vancomycin, dosed per pharmacy  Airborne and contact precautions  Vital signs performed  Send out SARS-COV2  Repeat chest x-ray  #Chest pain rule out #ST elevation #CAD Patient presenting with bilateral chest pain.  Likely from pneumonia.  Did have ST elevation and other nonspecific ST changes on EKG.  Initial troponin was negative.  Cycle troponin  Cardiac telemetry  Low threshold to consult cardiology if elevated troponins  Repeat EKG  Continue ASA  #ESRD on HD Has not missed session.  Gets dialysis TTS.  Consult nephrology, appreciate recommendations  Daily weights  Strict I's and O's  #DMT2 with neuropathy Hemoglobin A1c 7.4 on 08/2018.  Patient take glipizide at home.  Hold home glipizide  Sliding scale insulin sensitive with every 4 hour CBG checks  Repeat  A1c  #HLD  Continue home rosuvastatin  #HFpEF EF 55 to 55% on 08/2018  Continue home metoprolol  Daily weights  Strict I's and O's  #HTN Patient hypertensive on BP 190/90.  Home medications include amlodipine and metoprolol.  Consult nephrology, appreciate recommendations  Continue home amlodipine and metoprolol  #AOCD #Iron deficiency anemia Hemoglobin stable at 11 on admission.  This is at baseline.  Monitor hemoglobin  FEN/GI: Renal/carb modified diet Prophylaxis: Heparin  Disposition: Telemetry  History of Present Illness:  Blake Santana is a 48 y.o. male presenting with 4 days of SOB and chest pain. The chest pain shortness of breath were progressively worsening.  Associated with diaphoresis, chills, cough, congestion, and back pain.  Chest pain was bilateral.  It is pressure-like.  Nothing makes it better.  Nothing is worse.  The chest pain started off intermittently but is now progressed to be constant.  The patient drove by private vehicle to the emergency department.  He has ESRD on HD Tuesday Thursday Saturday.  He denies missing any sessions.  Patient also complaining about worsening right foot pain.  Has not had no injury.  Describes it as feeling different from pins-and-needles from his diabetic neuropathy.  In the ED, patient was found to have O2 saturations in the 80s on room air.  His blood pressure was elevated in the systolics of 248G.  He was immediately placed on nasal cannula to maintain oxygen saturations.  This ultimately had to be titrated up to 4 L.  Patient had chest x-ray that showed bilateral opacities concerning for edema versus pneumonia.  Patient was started on broad-spectrum antibiotics in the ED including cefepime and vancomycin.  His labs were  consistent with ESRD with elevated creatinine 10, mild hyperkalemia 5.4.  Also had a leukocytosis at 12.  He was afebrile.  EKG did show some ST abnormalities in elevation.  I-STAT troponin was 0.03.   SARS-COV 2 was negative.  Blood cultures were obtained.  FOBT negative.  Due to language barrier, an interpreter was present during the history-taking and subsequent discussion (and for part of the physical exam) with this patient.  Review Of Systems: Per HPI with the following additions:   Review of Systems  Constitutional: Positive for chills and fever.  HENT: Positive for congestion.   Respiratory: Positive for cough and shortness of breath.   Cardiovascular: Positive for chest pain. Negative for leg swelling.  Gastrointestinal: Negative for abdominal pain.  Musculoskeletal: Positive for back pain.  All other systems reviewed and are negative.   Patient Active Problem List   Diagnosis Date Noted  . Hypoxia   . Abnormal EKG   . Volume overload 09/02/2018  . Pleural effusion, bilateral 09/02/2018  . Atypical chest pain 09/01/2018  . Acute respiratory failure with hypoxia ([place]) 09/01/2018  . Flash pulmonary edema ([place]) 09/01/2018  . Community acquired pneumonia   . Elevated troponin   . Malnutrition of moderate degree 08/11/2017  . RSV (respiratory syncytial virus infection) 08/09/2017  . ESRD (end stage renal disease) ([place]) 08/08/2017  . Acute exacerbation of CHF (congestive heart failure) ([place]) 04/03/2017  . Anemia of chronic kidney failure 03/11/2017  . Hypertension 03/11/2017  . Chronic kidney disease 03/11/2017  . Edema 01/14/2017  . Dyslipidemia 10/15/2016  . Diabetic retinopathy ([place]) 10/15/2016  . Microalbuminuria 06/18/2016  . Normocytic anemia 06/18/2016  . Iron deficiency anemia 06/18/2016  . Diabetes mellitus with ESRD (end-stage renal disease) ([place]) 10/20/2001    Past Medical History: Past Medical History:  Diagnosis Date  . Acute respiratory failure with hypoxia ([place]) 09/07/2018  . Anemia of chronic kidney failure 03/11/2017  . Chest pain 09/01/2018  . CHF (congestive heart failure) ([place])   . Chronic kidney disease 03/11/2017  . Diabetes mellitus without  complication ([place]) 1610  . Edema 01/14/2017  . Headache   . Hypercholesterolemia   . Hypertension 03/11/2017  . Iron deficiency anemia 06/18/2016  . Pneumonia 09/07/2018  . RSV (respiratory syncytial virus infection) 08/2017    Past Surgical History: Past Surgical History:  Procedure Laterality Date  . A/V FISTULAGRAM Left 01/19/2018   Procedure: A/V FISTULAGRAM - Left AV;  Surgeon: Waynetta Sandy, MD;  Location: [place];  Service: Cardiovascular;  Laterality: Left;  . AV FISTULA PLACEMENT Left 08/09/2017   Procedure: LEFT RADIOCEPHALIC ARTERIOVENOUS (AV) FISTULA CREATION;  Surgeon: [name], MD;  Location: [facility];  Service: Vascular;  Laterality: Left;  . AV FISTULA PLACEMENT Left 02/02/2018   Procedure: ARTERIOVENOUS (AV) FISTULA CREATION LEFT UPPER EXTREMITY;  Surgeon: [name], MD;  Location: [place];  Service: Vascular;  Laterality: Left;  . INSERTION OF DIALYSIS CATHETER  08/09/2017  . INSERTION OF DIALYSIS CATHETER Right 08/09/2017   Procedure: INSERTION OF DIALYSIS CATHETER RIGHT INTERNAL JUGULAR;  Surgeon: [name], MD;  Location: [facility];  Service: Vascular;  Laterality: Right;  . IR REMOVAL TUN CV CATH W/O FL  07/01/2018  . LEFT HEART CATH AND CORONARY ANGIOGRAPHY N/A 09/05/2018   Procedure: LEFT HEART CATH AND CORONARY ANGIOGRAPHY;  Surgeon: [name], MD;  Location: [place] [facility];  Service: Cardiovascular;  Laterality: N/A;    Social History: Social History   Tobacco  Use  . Smoking status: Former Smoker    Types: Cigarettes  . Smokeless tobacco: Never Used  Substance Use Topics  . Alcohol use: Not Currently  . Drug use: No    Please also refer to relevant sections of EMR.  Family History: Family History  Problem Relation Age of Onset  . Diabetes Mother   . Kidney disease Mother        renal failure--diabetes, cause of death  . Hypertension Mother   . Diabetes Father   . Kidney disease Father        Cause of death:   kidney failure form diabetes  . Alcohol abuse Father   . Diabetes Sister      Allergies and Medications: No Known Allergies No current facility-administered medications on file prior to encounter.    Current Outpatient Medications on File Prior to Encounter  Medication Sig Dispense Refill  . amLODipine (NORVASC) 5 MG tablet Take 1 tablet (5 mg total) by mouth daily with breakfast. 30 tablet 2  . aspirin EC 81 MG EC tablet Take 1 tablet (81 mg total) by mouth daily. 30 tablet 0  . b complex-vitamin c-folic acid (NEPHRO-VITE) 0.8 MG TABS tablet Take 1 tablet by mouth daily.     Marland Kitchen gabapentin (NEURONTIN) 300 MG capsule Take 1 capsule (300 mg total) by mouth Every Tuesday,Thursday,and Saturday with dialysis. 15 capsule 0  . glipiZIDE (GLUCOTROL) 5 MG tablet 1 tab by mouth twice daily with meals (Patient taking differently: Take 5 mg by mouth 2 (two) times daily with a meal. ) 60 tablet 11  . metoprolol tartrate (LOPRESSOR) 25 MG tablet Take 1 tablet (25 mg total) by mouth 2 (two) times daily with a meal. 60 tablet 0  . rosuvastatin (CRESTOR) 20 MG tablet Take 1 tablet (20 mg total) by mouth daily at 6 PM. 30 tablet 11  . sucroferric oxyhydroxide (VELPHORO) 500 MG chewable tablet Chew 500 mg by mouth 3 (three) times daily with meals.    . Blood Glucose Monitoring Suppl (AGAMATRIX PRESTO) w/Device KIT Check sugars twice daily 1 kit 0  . glucose blood (AGAMATRIX PRESTO TEST) test strip Check sugars twice daily 100 each 12    Objective: BP (!) 199/93   Pulse 92   Temp 99.4 F (37.4 C) (Rectal)   Resp (!) 21   Ht _0  (1.575 m)   Wt 65.3 kg Comment: from Jan 2020 records  SpO2 97%   BMI 26.34 kg/m  Exam: Physical Exam Constitutional:      General: He is not in acute distress.    Appearance: He is not diaphoretic.  HENT:     Head: Normocephalic and atraumatic.  Eyes:     Extraocular Movements: Extraocular movements intact.  Neck:     Vascular: No JVD.  Cardiovascular:     Rate  and Rhythm: Normal rate and regular rhythm.     Heart sounds: Normal heart sounds.  Pulmonary:     Effort: No respiratory distress.     Breath sounds: Examination of the right-lower field reveals rales. Examination of the left-lower field reveals rales. Rales present.  Chest:     Chest wall: No tenderness.  Abdominal:     Palpations: Abdomen is soft.     Tenderness: There is no abdominal tenderness. There is no rebound.  Musculoskeletal: Normal range of motion.     Right lower leg: No edema.     Left lower leg: No edema.  Skin:    General: Skin  is warm and dry.  Neurological:     General: No focal deficit present.     Mental Status: He is alert and oriented to person, place, and time.  Psychiatric:        Mood and Affect: Mood normal.        Behavior: Behavior normal.      Labs and Imaging: Results for orders placed or performed during the hospital encounter of 11/22/18 (from the past 24 hour(s))  CBG monitoring, ED     Status: Abnormal   Collection Time: 11/22/18  2:37 AM  Result Value Ref Range   Glucose-Capillary 127 (H) 70 - 99 mg/dL  Troponin I - ONCE - STAT     Status: Abnormal   Collection Time: 11/22/18  2:43 AM  Result Value Ref Range   Troponin I 0.03 (HH) <0.03 ng/mL  CBC with Differential     Status: Abnormal   Collection Time: 11/22/18  2:44 AM  Result Value Ref Range   WBC 11.4 (H) 4.0 - 10.5 K/uL   RBC 3.75 (L) 4.22 - 5.81 MIL/uL   Hemoglobin 11.0 (L) 13.0 - 17.0 g/dL   HCT 35.6 (L) 39.0 - 52.0 %   MCV 94.9 80.0 - 100.0 fL   MCH 29.3 26.0 - 34.0 pg   MCHC 30.9 30.0 - 36.0 g/dL   RDW 14.0 11.5 - 15.5 %   Platelets 163 150 - 400 K/uL   Neutrophils Relative % 72 %   Neutro Abs 8.2 (H) 1.7 - 7.7 K/uL   Lymphocytes Relative 16 %   Lymphs Abs 1.8 0.7 - 4.0 K/uL   Monocytes Relative 6 %   Monocytes Absolute 0.7 0.1 - 1.0 K/uL   Eosinophils Relative 5 %   Eosinophils Absolute 0.6 (H) 0.0 - 0.5 K/uL   Basophils Relative 1 %   Basophils Absolute 0.1 0.0 -  0.1 K/uL  Comprehensive metabolic panel     Status: Abnormal   Collection Time: 11/22/18  2:44 AM  Result Value Ref Range   Sodium 140 135 - 145 mmol/L   Potassium 5.4 (H) 3.5 - 5.1 mmol/L   Chloride 97 (L) 98 - 111 mmol/L   CO2 29 22 - 32 mmol/L   Glucose, Bld 135 (H) 70 - 99 mg/dL   BUN 63 (H) 6 - 20 mg/dL   Creatinine, Ser 10.49 (H) 0.61 - 1.24 mg/dL   Calcium 10.0 8.9 - 10.3 mg/dL   Total Protein 7.7 6.5 - 8.1 g/dL   Albumin 3.8 3.5 - 5.0 g/dL   AST 15 15 - 41 U/L   ALT 11 0 - 44 U/L   Alkaline Phosphatase 87 38 - 126 U/L   Total Bilirubin 0.6 0.3 - 1.2 mg/dL   GFR calc non Af Amer 5 (L) >60 mL/min   GFR calc Af Amer 6 (L) >60 mL/min   Anion gap 14 5 - 15  Lactic acid, plasma     Status: None   Collection Time: 11/22/18  2:44 AM  Result Value Ref Range   Lactic Acid, Venous 1.0 0.5 - 1.9 mmol/L  I-STAT 7, (LYTES, BLD GAS, ICA, H+H)     Status: Abnormal   Collection Time: 11/22/18  2:47 AM  Result Value Ref Range   pH, Arterial 7.465 (H) 7.350 - 7.450   pCO2 arterial 43.1 32.0 - 48.0 mmHg   pO2, Arterial 63.0 (L) 83.0 - 108.0 mmHg   Bicarbonate 30.9 (H) 20.0 - 28.0 mmol/L   TCO2 32 22 -  32 mmol/L   O2 Saturation 92.0 %   Acid-Base Excess 7.0 (H) 0.0 - 2.0 mmol/L   Sodium 138 135 - 145 mmol/L   Potassium 5.5 (H) 3.5 - 5.1 mmol/L   Calcium, Ion 1.24 1.15 - 1.40 mmol/L   HCT 32.0 (L) 39.0 - 52.0 %   Hemoglobin 10.9 (L) 13.0 - 17.0 g/dL   Patient temperature 99.4 F    Collection site RADIAL, ALLEN'S TEST ACCEPTABLE    Drawn by RT    Sample type ARTERIAL   SARS Coronavirus 2 [facility] order, Performed in [place] hospital lab)     Status: None   Collection Time: 11/22/18  3:28 AM  Result Value Ref Range   SARS Coronavirus 2 NEGATIVE NEGATIVE  POC occult blood, ED     Status: None   Collection Time: 11/22/18  3:48 AM  Result Value Ref Range   Fecal Occult Bld NEGATIVE NEGATIVE     Bonnita Hollow, MD 11/22/2018, 5:26 AM PGY-2, [place] [facility] pager: [phone], text pages welcome

## 2018-11-22 NOTE — Consult Note (Addendum)
NAME:  Crystal Ellwood, MRN:  [id number], DOB:  11/13/1970, LOS: 0 ADMISSION DATE:  11/22/2018, CONSULTATION DATE:  11/22/2018 REFERRING MD:  Ardelia Mems - FMTS , CHIEF COMPLAINT:  Hypoxia   Brief History   48 year old male COVID-19 rule out patient with increasing oxygen requirement over course of day, peaking at 15L HFNC. PCCM consulted   History of present illness   48 yo M PMH ESRD T/R/Sa iHD presented to ED 4/21 with worsening shortness of breath. SOB began 4 days prior. Associated symptoms include diaphoresis, chills, cough, congestion, R and L sided chest pain described to initial clinician as pressure-like in nature. Chest pain was initially intermittent but has progressed to more constant pain. He denies any missed iHD sessions (T/R/Sa).  In ED noted to be hypoxic SpO2 80s on RA with SBPs 190. CXR revealed bilateral opacities-- edema vs PNA. In ED, patient on 4LNC. Started on vanc/cefepime. WBC 12. Afebrile. Trop 0.03. Initial COVID-19 test negative however he has subsequently been retested and is awaiting result. Admitted to FMTS and has exhibited increasing oxygen demand over course of day, escalating to 15LNC. Of note today (Tuesday 4/21) unable to have run of dialysis at hospital.   Past Medical History  ESRD on T/R/Sa iHD  CHF HTN HLD Fe Deficient anemia DM Malnutrition  Significant Hospital Events   4/21> admitted. Initial COVID-19 negative, repeat pending. No iHD today. Increasing O2 requirement over course of day. Transferring to ICU   Consults:  PCCM  Procedures:  noen at time of eval  Significant Diagnostic Tests:  CXR 4/21> bilateral airspace opacities. Cardiomegaly.   Micro Data:  4/21> SARS CoV 2- negative 4/21> REPEAT SARS CoV2>>> 4/21> BCx>>>   Antimicrobials:  4/21 Vanc>> 4/21 Cefepime >>  Interim history/subjective:  Increasing O2 requirement from 4L to 15L HFNC. PCCM consulted for worsening hypoxia.  Patient appears comfortable, is not  exhibiting signs of distress. Transferring to ICU for further management and care.   Objective   Blood pressure (!) 166/85, pulse 88, temperature 99.6 F (37.6 C), temperature source Oral, resp. rate (!) 22, height _0  (1.575 m), weight 65.3 kg, SpO2 100 %.        Intake/Output Summary (Last 24 hours) at 11/22/2018 1850 Last data filed at 11/22/2018 0825 Gross per 24 hour  Intake 354.76 ml  Output -  Net 354.76 ml   Filed Weights   11/22/18 0344  Weight: 65.3 kg    Examination: General: awake alert diaphoretic verbal HENT: normocephalic atraumatic  Lungs: coarse rhonci diffuse tachypneic Cardiovascular: S1 and S2 increased rate Abdomen: soft non distended nontender Extremities: not swollen Neuro: alert awake fatigued  Skin: right petechial rash non blanching erythematous on both left and right hand. Finger tips and toes perfused   Assessment & Plan:   Acute hypoxemic respiratory failure:  bibasilar opacification on CXR. Initial COVID testing negative. Retest pending at the time of eval.  Imaging suspicious for pulmonary edema in the setting of hypertension and ESRD. ( BNP 2559.3- oversestimated in ESRD) History also cannot rule out bacterial infection.  - Transition to HFNC supplemental O2 with cap at 100% 15LPM.  - Nephrology following,will call and discuss possibility of HD soon - Follow COVID-19 testing (initial in house test neg, send out lab pending) - Empiric antibiotics as above - At risk for decline and intubation - PCT pending  ESRD on HD (TTS) - Nephrology consulted - if pt is COVID positive will most likely be CRRT and need a  catheter  Hyperkalemia - ESRD pt  - no EKG changes - plan for CRRT will f/u BMET post  Hypertension - Continuing home amlodipine, metoprolol - SBP goal < 121mHg - ICU monitoring - EKG showed Qtc 493 w/ LAFB - will f/u with QT and continue on cardiac monitoring  DM - CBG monitoring and SSI  Chest pain: initial EKG with  non-specific changes - trending troponins  Best practice:  Diet: NPO and fluid restriction for now Pain/Anxiety/Delirium protocol (if indicated): will assess need VAP protocol (if indicated): not intubated DVT prophylaxis: heparin [place] GI prophylaxis: not currently Glucose control: if BG exceeds >180 mg/dl  Mobility: bedrest Code Status: Full code Family Communication: pt has a wife speaks Spanish primarily Disposition: ICU  Labs   CBC: Recent Labs  Lab 11/22/18 0244 11/22/18 0247 11/22/18 0607  WBC 11.4*  --  12.5*  NEUTROABS 8.2*  --   --   HGB 11.0* 10.9* 10.3*  HCT 35.6* 32.0* 32.8*  MCV 94.9  --  95.6  PLT 163  --  144*    Basic Metabolic Panel: Recent Labs  Lab 11/22/18 0244 11/22/18 0247 11/22/18 0607  NA 140 138  --   K 5.4* 5.5*  --   CL 97*  --   --   CO2 29  --   --   GLUCOSE 135*  --   --   BUN 63*  --   --   CREATININE 10.49*  --  10.95*  CALCIUM 10.0  --   --    GFR: Estimated Creatinine Clearance: 6.2 mL/min (A) (by C-G formula based on SCr of 10.95 mg/dL (H)). Recent Labs  Lab 11/22/18 0244 11/22/18 0607 11/22/18 0730  WBC 11.4* 12.5*  --   LATICACIDVEN 1.0  --  0.9    Liver Function Tests: Recent Labs  Lab 11/22/18 0244  AST 15  ALT 11  ALKPHOS 87  BILITOT 0.6  PROT 7.7  ALBUMIN 3.8   No results for input(s): LIPASE, AMYLASE in the last 168 hours. No results for input(s): AMMONIA in the last 168 hours.  ABG    Component Value Date/Time   PHART 7.465 (H) 11/22/2018 0247   PCO2ART 43.1 11/22/2018 0247   PO2ART 63.0 (L) 11/22/2018 0247   HCO3 30.9 (H) 11/22/2018 0247   TCO2 32 11/22/2018 0247   O2SAT 92.0 11/22/2018 0247     Coagulation Profile: No results for input(s): INR, PROTIME in the last 168 hours.  Cardiac Enzymes: Recent Labs  Lab 11/22/18 0243 11/22/18 0607 11/22/18 1149  TROPONINI 0.03* 0.04* 0.08*    HbA1C: Hgb A1c MFr Bld  Date/Time Value Ref Range Status  11/22/2018 06:10 AM 7.1 (H) 4.8 - 5.6 %  Final    Comment:    (NOTE) Pre diabetes:          5.7%-6.4% Diabetes:              >6.4% Glycemic control for   <7.0% adults with diabetes   08/26/2018 09:51 AM 7.4 (H) 4.8 - 5.6 % Final    Comment:             Prediabetes: 5.7 - 6.4          Diabetes: >6.4          Glycemic control for adults with diabetes: <7.0     CBG: Recent Labs  Lab 11/22/18 0237 11/22/18 1130 11/22/18 1655  GLUCAP 127* 159* 160*    Review of Systems:   Unable  to obtain due to worsening shortness of breath, language barrier without interpreter service available    Past Medical History  He,  has a past medical history of Acute respiratory failure with hypoxia ([place]) (09/07/2018), Anemia of chronic kidney failure (03/11/2017), Chest pain (09/01/2018), CHF (congestive heart failure) ([place]), Chronic kidney disease (03/11/2017), Diabetes mellitus without complication ([place]) (0102), Edema (01/14/2017), Headache, Hypercholesterolemia, Hypertension (03/11/2017), Iron deficiency anemia (06/18/2016), Pneumonia (09/07/2018), and RSV (respiratory syncytial virus infection) (08/2017).   Surgical History    Past Surgical History:  Procedure Laterality Date  . A/V FISTULAGRAM Left 01/19/2018   Procedure: A/V FISTULAGRAM - Left AV;  Surgeon: Waynetta Sandy, MD;  Location: [facility];  Service: Cardiovascular;  Laterality: Left;  . AV FISTULA PLACEMENT Left 08/09/2017   Procedure: LEFT RADIOCEPHALIC ARTERIOVENOUS (AV) FISTULA CREATION;  Surgeon: [name], MD;  Location: [facility] OR;  Service: Vascular;  Laterality: Left;  . AV FISTULA PLACEMENT Left 02/02/2018   Procedure: ARTERIOVENOUS (AV) FISTULA CREATION LEFT UPPER EXTREMITY;  Surgeon: [name], MD;  Location: [facility];  Service: Vascular;  Laterality: Left;  . INSERTION OF DIALYSIS CATHETER  08/09/2017  . INSERTION OF DIALYSIS CATHETER Right 08/09/2017   Procedure: INSERTION OF DIALYSIS CATHETER RIGHT INTERNAL JUGULAR;  Surgeon: [name], MD;  Location:  [facility] OR;  Service: Vascular;  Laterality: Right;  . IR REMOVAL TUN CV CATH W/O FL  07/01/2018  . LEFT HEART CATH AND CORONARY ANGIOGRAPHY N/A 09/05/2018   Procedure: LEFT HEART CATH AND CORONARY ANGIOGRAPHY;  Surgeon: [name], MD;  Location: [facility];  Service: Cardiovascular;  Laterality: N/A;     Social History   reports that he has quit smoking. His smoking use included cigarettes. He has never used smokeless tobacco. He reports previous alcohol use. He reports that he does not use drugs.   Family History   His family history includes Alcohol abuse in his father; Diabetes in his father, mother, and sister; Hypertension in his mother; Kidney disease in his father and mother.   Allergies No Known Allergies   Home Medications  Prior to Admission medications   Medication Sig Start Date End Date Taking? Authorizing Provider  amLODipine (NORVASC) 5 MG tablet Take 1 tablet (5 mg total) by mouth daily with breakfast. 11/10/18  Yes Mack Hook, MD  aspirin EC 81 MG EC tablet Take 1 tablet (81 mg total) by mouth daily. 09/08/18  Yes Mullis, Kiersten P, DO  b complex-vitamin c-folic acid (NEPHRO-VITE) 0.8 MG TABS tablet Take 1 tablet by mouth daily.  08/11/18  Yes [provider]  gabapentin (NEURONTIN) 300 MG capsule Take 1 capsule (300 mg total) by mouth Every Tuesday,Thursday,and Saturday with dialysis. 09/08/18  Yes Mullis, Kiersten P, DO  glipiZIDE (GLUCOTROL) 5 MG tablet 1 tab by mouth twice daily with meals Patient taking differently: Take 5 mg by mouth 2 (two) times daily with a meal.  10/22/17  Yes Mack Hook, MD  metoprolol tartrate (LOPRESSOR) 25 MG tablet Take 1 tablet (25 mg total) by mouth 2 (two) times daily with a meal. 09/07/18  Yes Mullis, Kiersten P, DO  rosuvastatin (CRESTOR) 20 MG tablet Take 1 tablet (20 mg total) by mouth daily at 6 PM. 10/17/18  Yes Mack Hook, MD  Blood Glucose Monitoring Suppl [facility] PRESTO) w/Device KIT Check  sugars twice daily 06/04/16   Mack Hook, MD  glucose blood (AGAMATRIX PRESTO TEST) test strip Check sugars twice daily 06/04/16   Mack Hook, MD  STAFF NOTE  I, Dr Seward Carol have personally reviewed patient's available data, including medical history, events of note, physical examination and test results as part of my evaluation. I have discussed with NP and other care providers such as pharmacist, RN and Elink.  In addition,  I personally evaluated patientThe patient is critically ill with multiple organ systems failure and requires high complexity decision making for assessment and support, frequent evaluation and titration of therapies, application of advanced monitoring technologies and extensive interpretation of multiple databases.   Critical Care Time devoted to patient care services described in this note is  Minutes. This time reflects time of care of this signee Dr Seward Carol. This critical care time does not reflect procedure time, or teaching time or supervisory time of NP but could involve care discussion time   Dr. Seward Carol Pulmonary Critical Care Medicine  11/22/2018 7:29 PM  Critical care time: 55 mins

## 2018-11-23 ENCOUNTER — Inpatient Hospital Stay (HOSPITAL_COMMUNITY): Payer: Medicaid Other

## 2018-11-23 DIAGNOSIS — J189 Pneumonia, unspecified organism: Secondary | ICD-10-CM

## 2018-11-23 LAB — POCT I-STAT EG7
Acid-Base Excess: 2 mmol/L (ref 0.0–2.0)
Bicarbonate: 29.5 mmol/L — ABNORMAL HIGH (ref 20.0–28.0)
Calcium, Ion: 1.14 mmol/L — ABNORMAL LOW (ref 1.15–1.40)
HCT: 28 % — ABNORMAL LOW (ref 39.0–52.0)
Hemoglobin: 9.5 g/dL — ABNORMAL LOW (ref 13.0–17.0)
O2 Saturation: 58 %
Potassium: 4.6 mmol/L (ref 3.5–5.1)
Sodium: 138 mmol/L (ref 135–145)
TCO2: 31 mmol/L (ref 22–32)
pCO2, Ven: 60.1 mmHg — ABNORMAL HIGH (ref 44.0–60.0)
pH, Ven: 7.3 (ref 7.250–7.430)
pO2, Ven: 34 mmHg (ref 32.0–45.0)

## 2018-11-23 LAB — CBC WITH DIFFERENTIAL/PLATELET
Abs Immature Granulocytes: 0.03 10*3/uL (ref 0.00–0.07)
Basophils Absolute: 0 10*3/uL (ref 0.0–0.1)
Basophils Relative: 0 %
Eosinophils Absolute: 0.1 10*3/uL (ref 0.0–0.5)
Eosinophils Relative: 1 %
HCT: 27.2 % — ABNORMAL LOW (ref 39.0–52.0)
Hemoglobin: 8.9 g/dL — ABNORMAL LOW (ref 13.0–17.0)
Immature Granulocytes: 0 %
Lymphocytes Relative: 15 %
Lymphs Abs: 1.3 10*3/uL (ref 0.7–4.0)
MCH: 31.6 pg (ref 26.0–34.0)
MCHC: 32.7 g/dL (ref 30.0–36.0)
MCV: 96.5 fL (ref 80.0–100.0)
Monocytes Absolute: 0.3 10*3/uL (ref 0.1–1.0)
Monocytes Relative: 4 %
Neutro Abs: 6.5 10*3/uL (ref 1.7–7.7)
Neutrophils Relative %: 80 %
Platelets: 117 10*3/uL — ABNORMAL LOW (ref 150–400)
RBC: 2.82 MIL/uL — ABNORMAL LOW (ref 4.22–5.81)
RDW: 14.6 % (ref 11.5–15.5)
WBC: 8.2 10*3/uL (ref 4.0–10.5)
nRBC: 0 % (ref 0.0–0.2)

## 2018-11-23 LAB — RENAL FUNCTION PANEL
Albumin: 2.7 g/dL — ABNORMAL LOW (ref 3.5–5.0)
Albumin: 2.8 g/dL — ABNORMAL LOW (ref 3.5–5.0)
Anion gap: 12 (ref 5–15)
Anion gap: 15 (ref 5–15)
BUN: 63 mg/dL — ABNORMAL HIGH (ref 6–20)
BUN: 41 mg/dL — ABNORMAL HIGH (ref 6–20)
CO2: 26 mmol/L (ref 22–32)
CO2: 20 mmol/L — ABNORMAL LOW (ref 22–32)
Calcium: 8.3 mg/dL — ABNORMAL LOW (ref 8.9–10.3)
Calcium: 7.8 mg/dL — ABNORMAL LOW (ref 8.9–10.3)
Chloride: 99 mmol/L (ref 98–111)
Chloride: 101 mmol/L (ref 98–111)
Creatinine, Ser: 9.18 mg/dL — ABNORMAL HIGH (ref 0.61–1.24)
Creatinine, Ser: 5.8 mg/dL — ABNORMAL HIGH (ref 0.61–1.24)
GFR calc Af Amer: 7 mL/min — ABNORMAL LOW (ref >60)
GFR calc Af Amer: 12 mL/min — ABNORMAL LOW (ref >60)
GFR calc non Af Amer: 6 mL/min — ABNORMAL LOW (ref >60)
GFR calc non Af Amer: 10 mL/min — ABNORMAL LOW (ref >60)
Glucose, Bld: 118 mg/dL — ABNORMAL HIGH (ref 70–99)
Glucose, Bld: 180 mg/dL — ABNORMAL HIGH (ref 70–99)
Phosphorus: 2.7 mg/dL (ref 2.5–4.6)
Phosphorus: 3.8 mg/dL (ref 2.5–4.6)
Potassium: 5 mmol/L (ref 3.5–5.1)
Potassium: 4.9 mmol/L (ref 3.5–5.1)
Sodium: 137 mmol/L (ref 135–145)
Sodium: 136 mmol/L (ref 135–145)

## 2018-11-23 LAB — POCT I-STAT 7, (LYTES, BLD GAS, ICA,H+H)
Acid-Base Excess: 1 mmol/L (ref 0.0–2.0)
Acid-Base Excess: 1 mmol/L (ref 0.0–2.0)
Bicarbonate: 26.7 mmol/L (ref 20.0–28.0)
Bicarbonate: 26.1 mmol/L (ref 20.0–28.0)
Calcium, Ion: 1.21 mmol/L (ref 1.15–1.40)
Calcium, Ion: 1.1 mmol/L — ABNORMAL LOW (ref 1.15–1.40)
HCT: 25 % — ABNORMAL LOW (ref 39.0–52.0)
HCT: 24 % — ABNORMAL LOW (ref 39.0–52.0)
Hemoglobin: 8.5 g/dL — ABNORMAL LOW (ref 13.0–17.0)
Hemoglobin: 8.2 g/dL — ABNORMAL LOW (ref 13.0–17.0)
O2 Saturation: 95 %
O2 Saturation: 97 %
Patient temperature: 97.7
Patient temperature: 98.7
Potassium: 5 mmol/L (ref 3.5–5.1)
Potassium: 5.6 mmol/L — ABNORMAL HIGH (ref 3.5–5.1)
Sodium: 137 mmol/L (ref 135–145)
Sodium: 135 mmol/L (ref 135–145)
TCO2: 28 mmol/L (ref 22–32)
TCO2: 27 mmol/L (ref 22–32)
pCO2 arterial: 48.1 mmHg — ABNORMAL HIGH (ref 32.0–48.0)
pCO2 arterial: 40.7 mmHg (ref 32.0–48.0)
pH, Arterial: 7.35 (ref 7.350–7.450)
pH, Arterial: 7.416 (ref 7.350–7.450)
pO2, Arterial: 81 mmHg — ABNORMAL LOW (ref 83.0–108.0)
pO2, Arterial: 95 mmHg (ref 83.0–108.0)

## 2018-11-23 LAB — POCT ACTIVATED CLOTTING TIME
Activated Clotting Time: 147 seconds
Activated Clotting Time: 164 seconds
Activated Clotting Time: 169 seconds
Activated Clotting Time: 164 seconds
Activated Clotting Time: 169 seconds
Activated Clotting Time: 164 seconds
Activated Clotting Time: 175 seconds
Activated Clotting Time: 169 seconds
Activated Clotting Time: 186 seconds
Activated Clotting Time: 191 seconds
Activated Clotting Time: 197 seconds
Activated Clotting Time: 208 seconds
Activated Clotting Time: 213 seconds

## 2018-11-23 LAB — GLUCOSE, CAPILLARY
Glucose-Capillary: 109 mg/dL — ABNORMAL HIGH (ref 70–99)
Glucose-Capillary: 111 mg/dL — ABNORMAL HIGH (ref 70–99)
Glucose-Capillary: 134 mg/dL — ABNORMAL HIGH (ref 70–99)
Glucose-Capillary: 160 mg/dL — ABNORMAL HIGH (ref 70–99)
Glucose-Capillary: 168 mg/dL — ABNORMAL HIGH (ref 70–99)
Glucose-Capillary: 180 mg/dL — ABNORMAL HIGH (ref 70–99)
Glucose-Capillary: 214 mg/dL — ABNORMAL HIGH (ref 70–99)

## 2018-11-23 LAB — MAGNESIUM
Magnesium: 2.5 mg/dL — ABNORMAL HIGH (ref 1.7–2.4)
Magnesium: 2.6 mg/dL — ABNORMAL HIGH (ref 1.7–2.4)

## 2018-11-23 LAB — HEPATIC FUNCTION PANEL
ALT: 10 U/L (ref 0–44)
AST: 14 U/L — ABNORMAL LOW (ref 15–41)
Albumin: 2.7 g/dL — ABNORMAL LOW (ref 3.5–5.0)
Alkaline Phosphatase: 54 U/L (ref 38–126)
Bilirubin, Direct: <0.1 mg/dL (ref 0.0–0.2)
Total Bilirubin: 0.8 mg/dL (ref 0.3–1.2)
Total Protein: 6.1 g/dL — ABNORMAL LOW (ref 6.5–8.1)

## 2018-11-23 LAB — D-DIMER, QUANTITATIVE: D-Dimer, Quant: 0.82 ug/mL-FEU — ABNORMAL HIGH (ref 0.00–0.50)

## 2018-11-23 LAB — TROPONIN I
Troponin I: 0.25 ng/mL (ref <0.03)
Troponin I: 0.15 ng/mL (ref <0.03)

## 2018-11-23 LAB — C-REACTIVE PROTEIN: CRP: 17.6 mg/dL — ABNORMAL HIGH (ref <1.0)

## 2018-11-23 LAB — TRIGLYCERIDES: Triglycerides: 68 mg/dL (ref <150)

## 2018-11-23 LAB — FERRITIN: Ferritin: 726 ng/mL — ABNORMAL HIGH (ref 24–336)

## 2018-11-23 LAB — APTT: aPTT: 78 seconds — ABNORMAL HIGH (ref 24–36)

## 2018-11-23 LAB — PROCALCITONIN: Procalcitonin: 8.64 ng/mL

## 2018-11-23 LAB — FIBRINOGEN: Fibrinogen: 543 mg/dL — ABNORMAL HIGH (ref 210–475)

## 2018-11-23 MED ORDER — METHYLPREDNISOLONE SODIUM SUCC 125 MG IJ SOLR
40.0000 mg | Freq: Three times a day (TID) | INTRAMUSCULAR | Status: AC
  Administered 2018-11-23 – 2018-11-26 (×9): 40 mg via INTRAVENOUS
  Filled 2018-11-23 (×9): qty 2

## 2018-11-23 MED ORDER — HEPARIN SODIUM (PORCINE) 5000 UNIT/ML IJ SOLN
5000.0000 [IU] | Freq: Three times a day (TID) | INTRAMUSCULAR | Status: DC
  Administered 2018-11-23 (×2): 5000 [IU] via SUBCUTANEOUS
  Filled 2018-11-23 (×3): qty 1

## 2018-11-23 MED ORDER — METOPROLOL TARTRATE 12.5 MG HALF TABLET: 25.0000 mg | ORAL_TABLET | Freq: Two times a day (BID) | ORAL | Status: DC

## 2018-11-23 MED ORDER — DIGOXIN 0.25 MG/ML IJ SOLN: 0.2500 mg | Freq: Every morning | INTRAMUSCULAR | Status: DC

## 2018-11-23 MED ORDER — FENTANYL 2500MCG IN NS 250ML (10MCG/ML) PREMIX INFUSION
0.0000 ug/h | INTRAVENOUS | Status: DC
  Administered 2018-11-24: 09:00:00 200 ug/h via INTRAVENOUS
  Administered 2018-11-24: 150 ug/h via INTRAVENOUS
  Administered 2018-11-25: 18:00:00 200 ug/h via INTRAVENOUS
  Filled 2018-11-23 (×4): qty 250

## 2018-11-23 MED ORDER — SODIUM CHLORIDE 0.9 % IV SOLN
2.0000 g | Freq: Two times a day (BID) | INTRAVENOUS | Status: DC
  Administered 2018-11-23: 05:00:00 2 g via INTRAVENOUS
  Filled 2018-11-23 (×4): qty 2

## 2018-11-23 MED ORDER — SODIUM CHLORIDE 0.9 % IV SOLN
2.0000 g | Freq: Two times a day (BID) | INTRAVENOUS | Status: DC
  Administered 2018-11-23 – 2018-11-26 (×6): 2 g via INTRAVENOUS
  Filled 2018-11-23 (×8): qty 2

## 2018-11-23 MED ORDER — PRO-STAT SUGAR FREE PO LIQD: 60.0000 mL | Freq: Two times a day (BID) | ORAL | Status: DC

## 2018-11-23 MED ORDER — TOCILIZUMAB 400 MG/20ML IV SOLN
8.0000 mg/kg | Freq: Once | INTRAVENOUS | Status: AC
  Administered 2018-11-23: 12:00:00 516 mg via INTRAVENOUS
  Filled 2018-11-23: qty 25.8

## 2018-11-23 MED ORDER — VANCOMYCIN HCL IN DEXTROSE 750-5 MG/150ML-% IV SOLN
750.0000 mg | INTRAVENOUS | Status: DC
  Filled 2018-11-23: qty 150

## 2018-11-23 MED ORDER — VANCOMYCIN HCL IN DEXTROSE 750-5 MG/150ML-% IV SOLN
750.0000 mg | INTRAVENOUS | Status: DC
  Administered 2018-11-23 – 2018-11-24 (×2): 750 mg via INTRAVENOUS
  Filled 2018-11-23 (×2): qty 150

## 2018-11-23 MED ORDER — VITAL 1.5 CAL PO LIQD
1000.0000 mL | ORAL | Status: DC
  Filled 2018-11-23: qty 1000

## 2018-11-23 MED ORDER — VITAL 1.5 CAL PO LIQD
1000.0000 mL | ORAL | Status: DC
  Administered 2018-11-23 – 2018-11-25 (×3): 1000 mL
  Filled 2018-11-23 (×4): qty 1000

## 2018-11-23 MED ORDER — FENTANYL CITRATE (PF) 100 MCG/2ML IJ SOLN
100.0000 ug | Freq: Once | INTRAMUSCULAR | Status: AC
  Administered 2018-11-23: 100 ug via INTRAVENOUS
  Filled 2018-11-23: qty 2

## 2018-11-23 MED ORDER — ROSUVASTATIN CALCIUM 20 MG PO TABS
20.0000 mg | ORAL_TABLET | ORAL | Status: DC
  Administered 2018-11-23 – 2018-11-30 (×8): 20 mg via ORAL
  Filled 2018-11-23 (×8): qty 1

## 2018-11-23 MED ORDER — PRO-STAT SUGAR FREE PO LIQD
60.0000 mL | Freq: Four times a day (QID) | ORAL | Status: DC
  Administered 2018-11-23 – 2018-11-26 (×12): 60 mL
  Filled 2018-11-23: qty 120
  Filled 2018-11-23 (×7): qty 60
  Filled 2018-11-23: qty 120
  Filled 2018-11-23: qty 60

## 2018-11-23 MED ORDER — CALCIUM GLUCONATE-NACL 2-0.675 GM/100ML-% IV SOLN
2.0000 g | Freq: Once | INTRAVENOUS | Status: AC
  Administered 2018-11-23: 2000 mg via INTRAVENOUS
  Filled 2018-11-23: qty 100

## 2018-11-23 MED ORDER — FENTANYL 2500MCG IN NS 250ML (10MCG/ML) PREMIX INFUSION: 0.0000 ug/h | INTRAVENOUS | Status: DC

## 2018-11-23 NOTE — Progress Notes (Signed)
eLink Physician-Brief Progress Note Patient Name: Blake Santana DOB: January 13, 1971 MRN: [id number]   Date of Service  11/23/2018  HPI/Events of Note  QT .521, Ca 2 + 1.14  eICU Interventions  Calcium gluconate 2 gm iv x 1, check bmet, magnesium, repeat ionized calcium after iv calcium, Hydroxchloroquine has been discontinued        Frederik Pear 11/23/2018, 9:38 PM

## 2018-11-23 NOTE — Progress Notes (Signed)
NAME:  Blake Santana, MRN:  [id number], DOB:  1970-11-16, LOS: 1 ADMISSION DATE:  11/22/2018, CONSULTATION DATE:  11/22/2018 REFERRING MD:  Ardelia Mems - FMTS , CHIEF COMPLAINT:  Hypoxia   Brief History   48 year old male patient with ESRD with resp failure, increasing O2 requrements. PCCM consulted and pt intubated  Initial COVID testing was negative, but repeat testing was positive  Past Medical History  ESRD on T/R/Sa iHD  CHF HTN HLD Fe Deficient anemia DM Malnutrition  Significant Hospital Events   4/21> Admitted. Intuabted.   Consults:  PCCM  Procedures:  OETT 4/21 > Rt radial a line 4/21 > Rt IJ CVL 4/21 >  Significant Diagnostic Tests:  CXR 4/21> bilateral airspace opacities. Cardiomegaly.   Micro Data:  4/21> SARS CoV 2- negative 4/21> REPEAT SARS CoV2>>> Postive 4/21> BCx>>>  Sp Cx 4/21 >>  Antimicrobials:  4/21 Vanc>> 4/21 Cefepime >>  Interim history/subjective:  Continues on CVVH, vent.  Self extubated and had to be reintubated FiO2 is weaning down  Objective   Blood pressure 122/69, pulse 76, temperature 97.7 F (36.5 C), temperature source Oral, resp. rate 16, height 5\' 2"  (1.575 m), weight 64.5 kg, SpO2 100 %.    Vent Mode: PRVC FiO2 (%):  [60 %-100 %] 60 % Set Rate:  [16 bmp] 16 bmp Vt Set:  [440 mL] 440 mL PEEP:  [10 cmH20] 10 cmH20 Plateau Pressure:  [13 cmH20-25 cmH20] 13 cmH20   Intake/Output Summary (Last 24 hours) at 11/23/2018 0951 Last data filed at 11/23/2018 0900 Gross per 24 hour  Intake 517.36 ml  Output 1255 ml  Net -737.64 ml   Filed Weights   11/22/18 0344 11/23/18 0500  Weight: 65.3 kg 64.5 kg    Examination: Gen:      No acute distress HEENT:  EOMI, sclera anicteric, ETT Neck:     No masses; no thyromegaly Lungs:    Clear to auscultation bilaterally; normal respiratory effort CV:         Regular rate and rhythm; no murmurs Abd:      + bowel sounds; soft, non-tender; no palpable masses, no distension Ext:     No edema; adequate peripheral perfusion Skin:      Warm and dry; no rash Neuro: Sedated, unresponsive  Assessment & Plan:  48 year old with acute hypoxic respiratory failure, ARDS in the setting of overt infection  Respiratory failure ARDS net ventilation Reduce PEEP/FiO2 Repeat ABG.  O2 requirements are improving.  May be able to avoid pronating Stop Plaquenil as QTC is borderline (493) Actemra x1 Start Solu-Medrol Continue empiric antibiotics (Pct is high), follow ABG  ESRD on HD (TTS) On CRRT  Hyperkalemia - ESRD pt  - no EKG changes - plan for CRRT will f/u BMET post  Hypertension Continuing home amlodipine. Holding metoprolol EKG showed Qtc 493 w/ LAFB will f/u with QT and continue on cardiac monitoring  DM Continue SSI coverage  Chest pain: initial EKG with non-specific changes Follow troponins  Best practice:  Diet: NPO and fluid restriction for now Pain/Anxiety/Delirium protocol (if indicated): will assess need VAP protocol (if indicated): not intubated DVT prophylaxis: heparin [place] GI prophylaxis: not currently Glucose control: if BG exceeds >180 mg/dl  Mobility: bedrest Code Status: Full code Family Communication: pt has a wife speaks Spanish primarily. Called wife 4/22 but got voicemail Disposition: ICU  Labs   CBC: Recent Labs  Lab 11/22/18 0244  11/22/18 2979 11/22/18 2218 11/23/18 8921 11/23/18 0530 11/23/18 1941  WBC 11.4*  --  12.5*  --   --  8.2  --   NEUTROABS 8.2*  --   --   --   --  PENDING  --   HGB 11.0*   < > 10.3* 9.9* 8.5* 8.9* 9.5*  HCT 35.6*   < > 32.8* 29.0* 25.0* 27.2* 28.0*  MCV 94.9  --  95.6  --   --  96.5  --   PLT 163  --  144*  --   --  PENDING  --    < > = values in this interval not displayed.    Basic Metabolic Panel: Recent Labs  Lab 11/22/18 0244 11/22/18 0247 11/22/18 0607 11/22/18 2218 11/23/18 0312 11/23/18 0530 11/23/18 0658  NA 140 138  --  134* 137 137 138  K 5.4* 5.5*  --  6.9* 5.0 5.0 4.6  CL  97*  --   --   --   --  99  --   CO2 29  --   --   --   --  26  --   GLUCOSE 135*  --   --   --   --  118*  --   BUN 63*  --   --   --   --  63*  --   CREATININE 10.49*  --  10.95*  --   --  9.18*  --   CALCIUM 10.0  --   --   --   --  8.3*  --   MG  --   --   --   --   --  2.5*  --   PHOS  --   --   --   --   --  2.7  --    GFR: Estimated Creatinine Clearance: 7.4 mL/min (A) (by C-G formula based on SCr of 9.18 mg/dL (H)). Recent Labs  Lab 11/22/18 0244 11/22/18 0607 11/22/18 0730 11/22/18 1923 11/23/18 0530  PROCALCITON  --   --   --  2.15 8.64  WBC 11.4* 12.5*  --   --  8.2  LATICACIDVEN 1.0  --  0.9  --   --     Liver Function Tests: Recent Labs  Lab 11/22/18 0244 11/23/18 0530  AST 15 14*  ALT 11 10  ALKPHOS 87 54  BILITOT 0.6 0.8  PROT 7.7 6.1*  ALBUMIN 3.8 2.7*  2.7*   No results for input(s): LIPASE, AMYLASE in the last 168 hours. No results for input(s): AMMONIA in the last 168 hours.  ABG    Component Value Date/Time   PHART 7.350 11/23/2018 0312   PCO2ART 48.1 (H) 11/23/2018 0312   PO2ART 81.0 (L) 11/23/2018 0312   HCO3 29.5 (H) 11/23/2018 0658   TCO2 31 11/23/2018 0658   O2SAT 58.0 11/23/2018 0658     Coagulation Profile: No results for input(s): INR, PROTIME in the last 168 hours.  Cardiac Enzymes: Recent Labs  Lab 11/22/18 0243 11/22/18 0607 11/22/18 1149 11/22/18 1923 11/23/18 0530  TROPONINI 0.03* 0.04* 0.08* 0.11* 0.25*    HbA1C: Hgb A1c MFr Bld  Date/Time Value Ref Range Status  11/22/2018 06:10 AM 7.1 (H) 4.8 - 5.6 % Final    Comment:    (NOTE) Pre diabetes:          5.7%-6.4% Diabetes:              >6.4% Glycemic control for   <7.0% adults with diabetes   08/26/2018 09:51 AM  7.4 (H) 4.8 - 5.6 % Final    Comment:             Prediabetes: 5.7 - 6.4          Diabetes: >6.4          Glycemic control for adults with diabetes: <7.0     CBG: Recent Labs  Lab 11/22/18 1655 11/22/18 1936 11/23/18 0044 11/23/18 0351  11/23/18 0840  GLUCAP 160* 133* 180* 109* 111*   The patient is critically ill with multiple organ system failure and requires high complexity decision making for assessment and support, frequent evaluation and titration of therapies, advanced monitoring, review of radiographic studies and interpretation of complex data.   Critical Care Time devoted to patient care services, exclusive of separately billable procedures, described in this note is 35 minutes.   Marshell Garfinkel MD [place] Pager [phone] If no answer call [phone] 11/23/2018, 10:02 AM

## 2018-11-23 NOTE — Progress Notes (Addendum)
Initial Nutrition Assessment RD working remotely.  DOCUMENTATION CODES:   Not applicable  INTERVENTION:    Vital 1.5 at 20 ml/h (480 ml per day)  Pro-stat 60 ml QID  Provides 1520 kcal (1829 kcal total with lipids from propofol), 152 gm protein, 367 ml free water daily  NUTRITION DIAGNOSIS:   Inadequate oral intake related to inability to eat as evidenced by NPO status.  GOAL:   Patient will meet greater than or equal to 90% of their needs  MONITOR:   Vent status, TF tolerance, Labs, Skin, I & O's  REASON FOR ASSESSMENT:   Ventilator, Consult Enteral/tube feeding initiation and management  ASSESSMENT:   48 yo male with PMH of ESRD-HD, CHF, HTN, HLD, DM, and malnutrition who was admitted with respiratory failure requiring intubation. COVID positive.   Received MD Consult for TF initiation and management. OGT in place.  CRRT started last night.   Patient is currently intubated on ventilator support MV: 8.4 L/min Temp (24hrs), Avg:99.7 F (37.6 C), Min:97.7 F (36.5 C), Max:101.5 F (38.6 C)  Propofol: 11.7 ml/hr providing 309 kcal from lipid.  Labs reviewed. Creatinine 10.95 (H), BUN 63 (H), phosphorus 2.7 WNL, potassium 5 WNL, corrected calcium 9.34 WNL CBG's: 109-111  Medications reviewed and include Solumedrol, Rena-vit, Velphoro, Fentanyl, propofol.  Weight currently at EDW of 64.5 kg  NUTRITION - FOCUSED PHYSICAL EXAM:  unable to complete  Diet Order:   Diet Order            Diet NPO time specified  Diet effective now              EDUCATION NEEDS:   No education needs have been identified at this time  Skin:  Skin Assessment: Reviewed RN Assessment  Last BM:  4/20  Height:   Ht Readings from Last 1 Encounters:  11/22/18 5\' 2"  (1.575 m)    Weight:   Wt Readings from Last 1 Encounters:  11/23/18 64.5 kg   EDW 64.5 kg  Ideal Body Weight:  53.6 kg  BMI:  Body mass index is 26.01 kg/m.  Estimated Nutritional Needs:    Kcal:  1610  Protein:  130-160 gm  Fluid:  1 L +UOP    Molli Barrows, RD, LDN, [place] Pager [phone] After Hours Pager [phone]

## 2018-11-23 NOTE — Progress Notes (Signed)
Pharmacy Antibiotic Note  Blake Santana is a 48 y.o. male admitted on 11/22/2018 with pneumonia, now ruled in for Covid-19 and started on CRRT.  Pharmacy has been consulted for vancomycin and cefepime dosing, to adjust for CRRT.  Plan: Change vanc to 750mg  IV Q24H. Change cefepime to 2g IV Q12H.  Height: 5\' 2"  (157.5 cm) Weight: 144 lb (65.3 kg)(from Jan 2020 records) IBW/kg (Calculated) : 54.6  Temp (24hrs), Avg:100.4 F (38 C), Min:99.6 F (37.6 C), Max:101.5 F (38.6 C)  Recent Labs  Lab 11/22/18 0244 11/22/18 0607 11/22/18 0730  WBC 11.4* 12.5*  --   CREATININE 10.49* 10.95*  --   LATICACIDVEN 1.0  --  0.9    Estimated Creatinine Clearance: 6.2 mL/min (A) (by C-G formula based on SCr of 10.95 mg/dL (H)).    No Known Allergies   Thank you for allowing pharmacy to be a part of this patient's care.  Wynona Neat, PharmD, BCPS  11/23/2018 3:34 AM

## 2018-11-23 NOTE — Progress Notes (Signed)
Mr. Blake Santana remains on the ventilator recently testing positive for COVID-19.  Appreciate the care PCCM is providing.  FPTS will continue following along and hopes to assume care once extubated.  Suspect patient will remain at [facility] given need for HD.  Harriet [place], [place], PGY-3

## 2018-11-23 NOTE — Progress Notes (Signed)
Dialysis Coordinator notes positive COVID 19 test result and notified OP HD clinic staff to ensure safety and continuity of care.  Alphonzo Cruise Dialysis Coordinator [phone]

## 2018-11-23 NOTE — Progress Notes (Signed)
Updated wife on pts condition.  Informed her that he tested positive for the corona virus.  Translated via interpreter.  Irven [place], RN

## 2018-11-23 NOTE — Procedures (Addendum)
Intubation Procedure Note Irie Fiorello [id number] November 25, 1970  Procedure: Intubation Indications: self extubation- per nurse pt was on propofol at 10 and reached for his ETT.   Procedure Details Consent: Unable to obtain consent because of emergent medical necessity. Time Out: Verified patient identification, verified procedure, site/side was marked, verified correct patient position, special equipment/implants available, medications/allergies/relevent history reviewed, required imaging and test results available.  Performed  Maximum sterile technique was used including antiseptics, cap, gloves, gown, hand hygiene and mask.  MAC and 4  Negative pressure room with full PPE precautions RSI: fentanyl 50 Propofol 20 Rocuronium 50 Post intubation ETT at 26 Tube 7.5   Evaluation Hemodynamic Status: BP stable throughout; O2 sats: stable throughout Patient's Current Condition: stable Complications: No apparent complications Patient did tolerate procedure well. Chest X-ray ordered to verify placement. No CXR needed based on previous ETT position and presence of b/l breath sounds tube position is acceptable.   Rise Paganini [place] 11/23/2018

## 2018-11-23 NOTE — Progress Notes (Signed)
Patient ID: Blake Santana, male   DOB: Jun 16, 1971, 48 y.o.   MRN: [id number]  [facility] Progress Note    Subjective:   Pt developed worsening respiratory distress and hypoxia last pm requiring intubation and transfer to ICU.  He was started on CVVHD last night due to volume overload and worsening clinical status.   Objective:   BP 130/67   Pulse 73   Temp 97.7 F (36.5 C) (Oral)   Resp 15   Ht '5\' 2"'  (1.575 m)   Wt 64.5 kg   SpO2 100%   BMI 26.01 kg/m   Intake/Output: I/O last 3 completed shifts: In: 843.5 [I.V.:138.6; IV Piggyback:704.9] Out: 987 [Other:987]   Intake/Output this shift:  Total I/O In: 45.4 [I.V.:45.4] Out: 268 [Other:268] Weight change: -0.818 kg  Physical Exam: Gen: Intubated and sedated Direct physical contact not performed due to concerns for COVID-19 and referred to exam performed by PCCM.   Labs: BMET Recent Labs  Lab 11/22/18 0244 11/22/18 0247 11/22/18 0607 11/22/18 2218 11/23/18 0312 11/23/18 0530 11/23/18 0658  NA 140 138  --  134* 137 137 138  K 5.4* 5.5*  --  6.9* 5.0 5.0 4.6  CL 97*  --   --   --   --  99  --   CO2 29  --   --   --   --  26  --   GLUCOSE 135*  --   --   --   --  118*  --   BUN 63*  --   --   --   --  63*  --   CREATININE 10.49*  --  10.95*  --   --  9.18*  --   ALBUMIN 3.8  --   --   --   --  2.7*  2.7*  --   CALCIUM 10.0  --   --   --   --  8.3*  --   PHOS  --   --   --   --   --  2.7  --    CBC Recent Labs  Lab 11/22/18 0244  11/22/18 0607 11/22/18 2218 11/23/18 0312 11/23/18 0530 11/23/18 0658  WBC 11.4*  --  12.5*  --   --  8.2  --   NEUTROABS 8.2*  --   --   --   --  PENDING  --   HGB 11.0*   < > 10.3* 9.9* 8.5* 8.9* 9.5*  HCT 35.6*   < > 32.8* 29.0* 25.0* 27.2* 28.0*  MCV 94.9  --  95.6  --   --  96.5  --   PLT 163  --  144*  --   --  PENDING  --    < > = values in this interval not displayed.    '@IMGRELPRIORS' @ Medications:    . amLODipine  5 mg Oral Q breakfast  .  aspirin EC  81 mg Oral Daily  . chlorhexidine gluconate (MEDLINE KIT)  15 mL Mouth Rinse BID  . Chlorhexidine Gluconate Cloth  6 each Topical Q0600  . etomidate  20 mg Intravenous Once  . fentaNYL (SUBLIMAZE) injection  50 mcg Intravenous Once  . gabapentin  300 mg Oral Q T,Th,Sa-HD  . heparin injection (subcutaneous)  5,000 Units Subcutaneous Q8H  . insulin aspart  0-9 Units Subcutaneous Q4H  . mouth rinse  15 mL Mouth Rinse 10 times per day  . methylPREDNISolone (SOLU-MEDROL) injection  40 mg Intravenous  Q8H  . midazolam  2 mg Intravenous Once  . multivitamin  1 tablet Oral QHS  . rosuvastatin  20 mg Oral Q24H  . sucroferric oxyhydroxide  1,000 mg Oral TID WC   Dialysis Orders: Center: [facility]  on TTS . 180NRe Optiflux; Time: 3:45 hr; BFR 400; DFR 800; EDW 64.5kg 2K/2 Ca Heparin 2000 unit bolus  Hectorol 2 mcg IV 3x week during HD Venofer 50 mg IV q week Velphoro 500 mg 2 tabs PO TID with meals  Assessment/ Plan:   1. VDRF due to Covid-19- per PCCM, concerning for ARDS.  Off plaquenil due to prolonged QTC. 2. ESRD- currently on CVVHD due to volume overload and respiratory failure due to COVID-19.  Tolerating it well.   1. CVVHD prescription:  2K/2.5Ca for all fluids, UF goal 100 ml/hr, and using heparin 2. Continue with CVVHD for now. 3. Anemia: Hgb dropped to 9.5, cont to follow 4. Hyperkalemia- improved with CVVHD 5. CKD-MBD: stable 6. Nutrition: npo for now 7. Hypertension: improved with UF.   Donetta Potts, MD [place] Pager [phone] 11/23/2018, 10:26 AM

## 2018-11-24 ENCOUNTER — Telehealth: Payer: Self-pay | Admitting: Internal Medicine

## 2018-11-24 LAB — POCT I-STAT 7, (LYTES, BLD GAS, ICA,H+H)
Acid-Base Excess: 2 mmol/L (ref 0.0–2.0)
Bicarbonate: 26.7 mmol/L (ref 20.0–28.0)
Calcium, Ion: 1.14 mmol/L — ABNORMAL LOW (ref 1.15–1.40)
HCT: 23 % — ABNORMAL LOW (ref 39.0–52.0)
Hemoglobin: 7.8 g/dL — ABNORMAL LOW (ref 13.0–17.0)
O2 Saturation: 98 %
Patient temperature: 98.7
Potassium: 4.6 mmol/L (ref 3.5–5.1)
Sodium: 134 mmol/L — ABNORMAL LOW (ref 135–145)
TCO2: 28 mmol/L (ref 22–32)
pCO2 arterial: 43.8 mmHg (ref 32.0–48.0)
pH, Arterial: 7.393 (ref 7.350–7.450)
pO2, Arterial: 116 mmHg — ABNORMAL HIGH (ref 83.0–108.0)

## 2018-11-24 LAB — QUANTIFERON-TB GOLD PLUS (RQFGPL)
QuantiFERON Mitogen Value: 1.28 IU/mL
QuantiFERON Nil Value: 0.01 IU/mL
QuantiFERON TB1 Ag Value: 0.3 IU/mL
QuantiFERON TB2 Ag Value: 0.37 IU/mL

## 2018-11-24 LAB — TRIGLYCERIDES: Triglycerides: 84 mg/dL (ref <150)

## 2018-11-24 LAB — CBC
HCT: 25.3 % — ABNORMAL LOW (ref 39.0–52.0)
Hemoglobin: 7.9 g/dL — ABNORMAL LOW (ref 13.0–17.0)
MCH: 30.2 pg (ref 26.0–34.0)
MCHC: 31.2 g/dL (ref 30.0–36.0)
MCV: 96.6 fL (ref 80.0–100.0)
Platelets: 114 10*3/uL — ABNORMAL LOW (ref 150–400)
RBC: 2.62 MIL/uL — ABNORMAL LOW (ref 4.22–5.81)
RDW: 14.6 % (ref 11.5–15.5)
WBC: 7.2 10*3/uL (ref 4.0–10.5)
nRBC: 0 % (ref 0.0–0.2)

## 2018-11-24 LAB — COMPREHENSIVE METABOLIC PANEL
ALT: 12 U/L (ref 0–44)
AST: 16 U/L (ref 15–41)
Albumin: 2.6 g/dL — ABNORMAL LOW (ref 3.5–5.0)
Alkaline Phosphatase: 61 U/L (ref 38–126)
Anion gap: 10 (ref 5–15)
BUN: 42 mg/dL — ABNORMAL HIGH (ref 6–20)
CO2: 24 mmol/L (ref 22–32)
Calcium: 8.5 mg/dL — ABNORMAL LOW (ref 8.9–10.3)
Chloride: 100 mmol/L (ref 98–111)
Creatinine, Ser: 3.96 mg/dL — ABNORMAL HIGH (ref 0.61–1.24)
GFR calc Af Amer: 19 mL/min — ABNORMAL LOW (ref >60)
GFR calc non Af Amer: 17 mL/min — ABNORMAL LOW (ref >60)
Glucose, Bld: 213 mg/dL — ABNORMAL HIGH (ref 70–99)
Potassium: 4.6 mmol/L (ref 3.5–5.1)
Sodium: 134 mmol/L — ABNORMAL LOW (ref 135–145)
Total Bilirubin: 0.8 mg/dL (ref 0.3–1.2)
Total Protein: 6.6 g/dL (ref 6.5–8.1)

## 2018-11-24 LAB — POCT ACTIVATED CLOTTING TIME
Activated Clotting Time: 191 seconds
Activated Clotting Time: 197 seconds
Activated Clotting Time: 197 seconds
Activated Clotting Time: 197 seconds
Activated Clotting Time: 208 seconds
Activated Clotting Time: 219 seconds

## 2018-11-24 LAB — APTT
aPTT: >200 seconds (ref 24–36)
aPTT: >200 seconds (ref 24–36)
aPTT: 173 seconds (ref 24–36)

## 2018-11-24 LAB — GLUCOSE, CAPILLARY
Glucose-Capillary: 186 mg/dL — ABNORMAL HIGH (ref 70–99)
Glucose-Capillary: 223 mg/dL — ABNORMAL HIGH (ref 70–99)
Glucose-Capillary: 245 mg/dL — ABNORMAL HIGH (ref 70–99)
Glucose-Capillary: 253 mg/dL — ABNORMAL HIGH (ref 70–99)
Glucose-Capillary: 261 mg/dL — ABNORMAL HIGH (ref 70–99)
Glucose-Capillary: 280 mg/dL — ABNORMAL HIGH (ref 70–99)

## 2018-11-24 LAB — RENAL FUNCTION PANEL
Albumin: 2.8 g/dL — ABNORMAL LOW (ref 3.5–5.0)
Anion gap: 12 (ref 5–15)
BUN: 41 mg/dL — ABNORMAL HIGH (ref 6–20)
CO2: 25 mmol/L (ref 22–32)
Calcium: 8.6 mg/dL — ABNORMAL LOW (ref 8.9–10.3)
Chloride: 97 mmol/L — ABNORMAL LOW (ref 98–111)
Creatinine, Ser: 3 mg/dL — ABNORMAL HIGH (ref 0.61–1.24)
GFR calc Af Amer: 27 mL/min — ABNORMAL LOW (ref >60)
GFR calc non Af Amer: 23 mL/min — ABNORMAL LOW (ref >60)
Glucose, Bld: 291 mg/dL — ABNORMAL HIGH (ref 70–99)
Phosphorus: 3.1 mg/dL (ref 2.5–4.6)
Potassium: 3.9 mmol/L (ref 3.5–5.1)
Sodium: 134 mmol/L — ABNORMAL LOW (ref 135–145)

## 2018-11-24 LAB — PHOSPHORUS: Phosphorus: 3.7 mg/dL (ref 2.5–4.6)

## 2018-11-24 LAB — QUANTIFERON-TB GOLD PLUS: QuantiFERON-TB Gold Plus: POSITIVE — AB

## 2018-11-24 LAB — MAGNESIUM: Magnesium: 2.5 mg/dL — ABNORMAL HIGH (ref 1.7–2.4)

## 2018-11-24 LAB — HEPARIN LEVEL (UNFRACTIONATED): Heparin Unfractionated: 0.61 IU/mL (ref 0.30–0.70)

## 2018-11-24 LAB — PROCALCITONIN: Procalcitonin: 29.94 ng/mL

## 2018-11-24 LAB — MRSA PCR SCREENING: MRSA by PCR: NEGATIVE

## 2018-11-24 MED ORDER — MIDAZOLAM HCL 2 MG/2ML IJ SOLN
INTRAMUSCULAR | Status: AC
  Administered 2018-11-24: 2 mg
  Filled 2018-11-24: qty 2

## 2018-11-24 MED ORDER — OXYCODONE HCL 5 MG/5ML PO SOLN
5.0000 mg | Freq: Four times a day (QID) | ORAL | Status: DC
  Administered 2018-11-24 – 2018-11-26 (×9): 5 mg
  Filled 2018-11-24 (×9): qty 5

## 2018-11-24 MED ORDER — PANTOPRAZOLE SODIUM 40 MG PO PACK
40.0000 mg | PACK | Freq: Every day | ORAL | Status: DC
  Administered 2018-11-24 – 2018-11-26 (×3): 40 mg
  Filled 2018-11-24 (×4): qty 20

## 2018-11-24 MED ORDER — INSULIN GLARGINE 100 UNIT/ML ~~LOC~~ SOLN
5.0000 [IU] | Freq: Every day | SUBCUTANEOUS | Status: DC
  Administered 2018-11-24: 09:00:00 5 [IU] via SUBCUTANEOUS
  Filled 2018-11-24 (×3): qty 0.05

## 2018-11-24 MED ORDER — MIDAZOLAM HCL 2 MG/2ML IJ SOLN
1.0000 mg | INTRAMUSCULAR | Status: DC | PRN
  Administered 2018-11-25 (×4): 2 mg via INTRAVENOUS
  Filled 2018-11-24 (×5): qty 2

## 2018-11-24 MED ORDER — FENTANYL BOLUS VIA INFUSION
50.0000 ug | INTRAVENOUS | Status: DC | PRN
  Filled 2018-11-24: qty 50

## 2018-11-24 MED ORDER — SODIUM CHLORIDE 0.9 % IV SOLN
INTRAVENOUS | Status: DC | PRN
  Administered 2018-11-25: 15:00:00 via INTRAVENOUS

## 2018-11-24 NOTE — Progress Notes (Signed)
eLink Physician-Brief Progress Note Patient Name: Lenorris Karger DOB: [birth year] MRN: [id number]   Date of Service  11/24/2018  HPI/Events of Note  Agitation on the ventilator  eICU Interventions  Prn Versed iv bolus for breakthrough agitation        Frederik Pear 11/24/2018, 9:46 PM

## 2018-11-24 NOTE — Progress Notes (Addendum)
NAME:  Blake Santana, MRN:  [id number], DOB:  [birth year], LOS: 2 ADMISSION DATE:  11/22/2018, CONSULTATION DATE:  11/22/2018 REFERRING MD:  Ardelia Mems - FMTS , CHIEF COMPLAINT:  Hypoxia   Brief History   48 year old male patient with ESRD with resp failure, increasing O2 requrements. PCCM consulted and pt intubated  Initial COVID testing was negative, but repeat testing was positive   Past Medical History  ESRD on T/R/Sa iHD  CHF HTN HLD Fe Deficient anemia DM Malnutrition  Significant Hospital Events   4/21> Admitted. Intubated.  4/22 > actemra x 1, plaquinel held due to prolonged qtc  Consults:  PCCM  Procedures:  OETT 4/21 > Rt radial a line 4/21 > Rt IJ CVL 4/21 >  Significant Diagnostic Tests:  CXR 4/21> bilateral airspace opacities. Cardiomegaly.   Micro Data:  4/21> SARS CoV 2- negative 4/21> REPEAT SARS CoV2>>> Postive 4/21> BCx>>>  Sp Cx 4/21 >>  Antimicrobials:  4/21 Vanc>> 4/21 Cefepime >>  Interim history/subjective:  Continues on CVVH.  PEEP and FiO2 are weaning down.  Objective   Blood pressure 130/67, pulse (!) 57, temperature (!) 96 F (35.6 C), temperature source Axillary, resp. rate 19, height 5\' 2"  (1.575 m), weight 63 kg, SpO2 100 %.    Vent Mode: PRVC FiO2 (%):  [40 %-60 %] 40 % Set Rate:  [16 bmp] 16 bmp Vt Set:  [440 mL] 440 mL PEEP:  [8 cmH20-10 cmH20] 8 cmH20 Plateau Pressure:  [13 cmH20-21 cmH20] 18 cmH20   Intake/Output Summary (Last 24 hours) at 11/24/2018 0829 Last data filed at 11/24/2018 0802 Gross per 24 hour  Intake 1500.14 ml  Output 3475 ml  Net -1974.86 ml   Filed Weights   11/22/18 0344 11/23/18 0500 11/24/18 0444  Weight: 65.3 kg 64.5 kg 63 kg    Examination: Gen:      No acute distress HEENT:  EOMI, sclera anicteric Neck:     No masses; no thyromegaly, ETT Lungs:    Clear to auscultation bilaterally; normal respiratory effort CV:         Regular rate and rhythm; no murmurs Abd:      + bowel sounds;  soft, non-tender; no palpable masses, no distension Ext:    No edema; adequate peripheral perfusion Skin:      Warm and dry; no rash Neuro: Sedated, intubated  Assessment & Plan:  48 year old with acute hypoxic respiratory failure, ARDS  Respiratory failure ARDS net ventilation Wean PEEP/FiO2 Actemra x1 Continue Solu-Medrol Continue empiric antibiotics (Pct is high), follow ABG  ESRD on HD (TTS) On CRRT. Net volume removal  Hypertension Continuing home amlodipine. Holding metoprolol Follow qtc  DM Continue SSI coverage Add lantus 5  Best practice:  Diet: NPO and fluid restriction for now Pain/Anxiety/Delirium protocol (if indicated): will assess need VAP protocol (if indicated): not intubated DVT prophylaxis: heparin [place] on hold due to elevated Acts. Getting heparin via CVVH GI prophylaxis: not currently Glucose control: if BG exceeds >180 mg/dl  Mobility: bedrest Code Status: Full code Family Communication: pt has a wife speaks Spanish primarily. Called wife 4/22 but got voicemail Disposition: ICU  Labs   CBC: Recent Labs  Lab 11/22/18 0244  11/22/18 0607  11/23/18 0530 11/23/18 0658 11/23/18 1129 11/24/18 0354 11/24/18 0432  WBC 11.4*  --  12.5*  --  8.2  --   --   --  7.2  NEUTROABS 8.2*  --   --   --  6.5  --   --   --   --  HGB 11.0*   < > 10.3*   < > 8.9* 9.5* 8.2* 7.8* 7.9*  HCT 35.6*   < > 32.8*   < > 27.2* 28.0* 24.0* 23.0* 25.3*  MCV 94.9  --  95.6  --  96.5  --   --   --  96.6  PLT 163  --  144*  --  117*  --   --   --  114*   < > = values in this interval not displayed.    Basic Metabolic Panel: Recent Labs  Lab 11/22/18 0244  11/22/18 0607  11/23/18 0530 11/23/18 0658 11/23/18 1129 11/23/18 1711 11/23/18 2155 11/24/18 0354 11/24/18 0432  NA 140   < >  --    < > 137 138 135 136  --  134* 134*  K 5.4*   < >  --    < > 5.0 4.6 5.6* 4.9  --  4.6 4.6  CL 97*  --   --   --  99  --   --  101  --   --  100  CO2 29  --   --   --  26  --    --  20*  --   --  24  GLUCOSE 135*  --   --   --  118*  --   --  180*  --   --  213*  BUN 63*  --   --   --  63*  --   --  41*  --   --  42*  CREATININE 10.49*  --  10.95*  --  9.18*  --   --  5.80*  --   --  3.96*  CALCIUM 10.0  --   --   --  8.3*  --   --  7.8*  --   --  8.5*  MG  --   --   --   --  2.5*  --   --   --  2.6*  --  2.5*  PHOS  --   --   --   --  2.7  --   --  3.8  --   --  3.7   < > = values in this interval not displayed.   GFR: Estimated Creatinine Clearance: 17.2 mL/min (A) (by C-G formula based on SCr of 3.96 mg/dL (H)). Recent Labs  Lab 11/22/18 0244 11/22/18 0607 11/22/18 0730 11/22/18 1923 11/23/18 0530 11/24/18 0432  PROCALCITON  --   --   --  2.15 8.64 29.94  WBC 11.4* 12.5*  --   --  8.2 7.2  LATICACIDVEN 1.0  --  0.9  --   --   --     Liver Function Tests: Recent Labs  Lab 11/22/18 0244 11/23/18 0530 11/23/18 1711 11/24/18 0432  AST 15 14*  --  16  ALT 11 10  --  12  ALKPHOS 87 54  --  61  BILITOT 0.6 0.8  --  0.8  PROT 7.7 6.1*  --  6.6  ALBUMIN 3.8 2.7*  2.7* 2.8* 2.6*   No results for input(s): LIPASE, AMYLASE in the last 168 hours. No results for input(s): AMMONIA in the last 168 hours.  ABG    Component Value Date/Time   PHART 7.393 11/24/2018 0354   PCO2ART 43.8 11/24/2018 0354   PO2ART 116.0 (H) 11/24/2018 0354   HCO3 26.7 11/24/2018 0354   TCO2 28 11/24/2018 0354   O2SAT  98.0 11/24/2018 0354     Coagulation Profile: No results for input(s): INR, PROTIME in the last 168 hours.  Cardiac Enzymes: Recent Labs  Lab 11/22/18 0607 11/22/18 1149 11/22/18 1923 11/23/18 0530 11/23/18 1711  TROPONINI 0.04* 0.08* 0.11* 0.25* 0.15*    HbA1C: Hgb A1c MFr Bld  Date/Time Value Ref Range Status  11/22/2018 06:10 AM 7.1 (H) 4.8 - 5.6 % Final    Comment:    (NOTE) Pre diabetes:          5.7%-6.4% Diabetes:              >6.4% Glycemic control for   <7.0% adults with diabetes   08/26/2018 09:51 AM 7.4 (H) 4.8 - 5.6 % Final     Comment:             Prediabetes: 5.7 - 6.4          Diabetes: >6.4          Glycemic control for adults with diabetes: <7.0     CBG: Recent Labs  Lab 11/23/18 1611 11/23/18 1929 11/23/18 2343 11/24/18 0439 11/24/18 0754  GLUCAP 160* 168* 214* 186* 245*   The patient is critically ill with multiple organ system failure and requires high complexity decision making for assessment and support, frequent evaluation and titration of therapies, advanced monitoring, review of radiographic studies and interpretation of complex data.   Critical Care Time devoted to patient care services, exclusive of separately billable procedures, described in this note is 35 minutes.   Marshell Garfinkel MD [facility] Pager [phone] If no answer call [phone] 11/24/2018, 8:39 AM

## 2018-11-24 NOTE — TOC Initial Note (Signed)
Transition of Care [facility]) - Initial/Assessment Note    Patient Details  Name: Blake Santana MRN: [id number] Date of Birth: 10/07/1970  Transition of Care [facility]) CM/SW Contact:    Wetzel Bjorn[place], [place] Phone Number: 11/24/2018, 9:26 AM  Clinical Narrative: CSW consulted as pt is reportedly having issues with getting medications. CSW spoke with wife and was advised that she doesn't speak much english. CSW reached out to language line to completed assessment. CSW spoke with wife with the help of [place] speaking interpretor. CSW was advised that pt is from home with wife and three children. Per Mrs. Ocejo, she is the one who takes care of pt getting pt to and from medical appointments as well as dialysis. CSW was advised that wife has been taking pt to dialysis for over a year and picking pt up. Wife reports that pt gets dialysis every Tuesday, Thursday, and Saturday at the dialysis center on [address]. CSW was advised that dialysis for pt is usually 212:30pm-4:30pm. Wife expressed that pt is also set up with Archie Balboa with the [place] [facility] for Primary care. Wife reported that pt was suppose to be seen Tuesday for follow up care, however due to the virus, pt was seen through tele health for this appointment.t   Wife reported that pt does have two different types of Medicaid. One is said to pay for hospital stay and the other to pay for Dialysis treatment. CSW was advised by wife that pt doesn't have a payor for medications therefore they use coupons given to them or the [place] for medication needs. Wife expressed that she has to renew medicating every three months in order to keep it active so that pt can continue with dialysis at this time. CSW offered floor for wife to ask questions and no other questions have been presented.    Expected Discharge Plan: Home/Self Care Barriers to Discharge: Inadequate or no insurance, Other (comment)(pt also postive for  COVID-19 and needing further interventions. )   Patient Goals and CMS Choice Patient states their goals for this hospitalization and ongoing recovery are:: (CSW spoke with wife. Wife is weanitng pt to get better adn return home. ) CMS Medicare.gov Compare Post Acute Care list provided to:: Other (Comment Required)(not at time as dispositon needs still being determined. ) Choice offered to / list presented to : NA  Expected Discharge Plan and Services Expected Discharge Plan: Home/Self Care In-house Referral: NA Discharge Planning Services: Medication Assistance, [facility](per pt's wife pt already [facility] with [facility] ) Post Acute Care Choice: NA Living arrangements for the past 2 months: Single Family Home(with wife adn three children. ) Expected Discharge Date: 11/25/18               DME Arranged: N/A DME Agency: NA       HH Arranged: NA          Prior Living Arrangements/Services Living arrangements for the past 2 months: Single Family Home(with wife adn three children. ) Lives with:: Spouse Patient language and need for interpreter reviewed:: Yes(pt's wife speaks only Spanish. ) Do you feel safe going back to the place where you live?: Yes      Need for Family Participation in Patient Care: Yes (Comment) Care giver support system in place?: No (comment)   Criminal Activity/Legal Involvement Pertinent to Current Situation/Hospitalization: No - Comment as needed  Activities of Daily Living Home Assistive Devices/Equipment: None ADL Screening (condition at  time of admission) Patient's cognitive ability adequate to safely complete daily activities?: Yes Is the patient deaf or have difficulty hearing?: No Does the patient have difficulty seeing, even when wearing glasses/contacts?: Yes Does the patient have difficulty concentrating, remembering, or making decisions?: No Patient able to express need for assistance with ADLs?: Yes Does the patient have  difficulty dressing or bathing?: No Independently performs ADLs?: Yes (appropriate for developmental age) Does the patient have difficulty walking or climbing stairs?: No Weakness of Legs: Both Weakness of Arms/Hands: None  Permission Sought/Granted Permission sought to share information with : Family Supports Permission granted to share information with : Yes, Verbal Permission Granted  Share Information with NAME: Mrs. Ocejo  Permission granted to share info w AGENCY: family  Permission granted to share info w Relationship: spouse  Permission granted to share info w Contact Information: Mrs. Ocejo   Emotional Assessment Appearance:: Other (Comment Required(spoke with wife via phone. ) Attitude/Demeanor/Rapport: Gracious Affect (typically observed): Accepting, Appropriate, Pleasant Orientation: : Oriented to Self, Oriented to Place, Oriented to  Time, Oriented to Situation Alcohol / Substance Use: Not Applicable Psych Involvement: No (comment)  Admission diagnosis:  Pulmonary edema [J81.1] Healthcare-associated pneumonia [J18.9] Acute respiratory failure with hypoxia ([place]) [J96.01] HCAP (healthcare-associated pneumonia) [J18.9] Patient Active Problem List   Diagnosis Date Noted  . HCAP (healthcare-associated pneumonia) 11/22/2018  . Suspected Covid-19 Virus Infection   . Endotracheal tube present   . COVID-19 virus detected   . Hypoxia   . Abnormal EKG   . Volume overload 09/02/2018  . Pleural effusion, bilateral 09/02/2018  . Atypical chest pain 09/01/2018  . Acute respiratory failure with hypoxia ([place]) 09/01/2018  . Acute pulmonary edema ([place]) 09/01/2018  . Community acquired pneumonia   . Elevated troponin   . Malnutrition of moderate degree 08/11/2017  . RSV (respiratory syncytial virus infection) 08/09/2017  . ESRD (end stage renal disease) on dialysis ([place]) 08/08/2017  . Acute exacerbation of CHF (congestive heart failure) ([place]) 04/03/2017  . Anemia of chronic  kidney failure 03/11/2017  . Hypertension 03/11/2017  . Chronic kidney disease 03/11/2017  . Edema 01/14/2017  . Dyslipidemia 10/15/2016  . Diabetic retinopathy ([place]) 10/15/2016  . Microalbuminuria 06/18/2016  . Normocytic anemia 06/18/2016  . Iron deficiency anemia 06/18/2016  . Diabetes mellitus with ESRD (end-stage renal disease) ([place]) 10/20/2001   PCP:  Mack Hook, MD Pharmacy:   [facility] [place] [address] Phone: [phone] Fax: [phone]     Social Determinants of Health (SDOH) Interventions    Readmission Risk Interventions No flowsheet data found.

## 2018-11-24 NOTE — Progress Notes (Signed)
Patient ID: Blake Santana, male   DOB: 1971-03-28, 48 y.o.   MRN: [id number] S: No events overnight O:BP 130/67   Pulse (!) 57   Temp (!) 96 F (35.6 C) (Axillary)   Resp 19   Ht _0  (1.575 m)   Wt 63 kg   SpO2 100%   BMI 25.40 kg/m   Intake/Output Summary (Last 24 hours) at 11/24/2018 0909 Last data filed at 11/24/2018 0900 Gross per 24 hour  Intake 1539.47 ml  Output 3501 ml  Net -1961.53 ml   Intake/Output: I/O last 3 completed shifts: In: 1863.3 [I.V.:583.4; NG/GT:480; IV Piggyback:799.9] Out: 3361 [Other:4459]  Intake/Output this shift:  Total I/O In: 193.5 [I.V.:73.5; NG/GT:120] Out: 297 [Other:297] Weight change: -1.5 kg Gen: intubated and sedated Direct physical contact was not performed due to Covid-19 infection and exam performed by PCCM was used to alter dialysis prescription.  Recent Labs  Lab 11/22/18 0244  11/22/18 0607  11/23/18 0312 11/23/18 0530 11/23/18 0658 11/23/18 1129 11/23/18 1711 11/24/18 0354 11/24/18 0432  NA 140   < >  --    < > 137 137 138 135 136 134* 134*  K 5.4*   < >  --    < > 5.0 5.0 4.6 5.6* 4.9 4.6 4.6  CL 97*  --   --   --   --  99  --   --  101  --  100  CO2 29  --   --   --   --  26  --   --  20*  --  24  GLUCOSE 135*  --   --   --   --  118*  --   --  180*  --  213*  BUN 63*  --   --   --   --  63*  --   --  41*  --  42*  CREATININE 10.49*  --  10.95*  --   --  9.18*  --   --  5.80*  --  3.96*  ALBUMIN 3.8  --   --   --   --  2.7*  2.7*  --   --  2.8*  --  2.6*  CALCIUM 10.0  --   --   --   --  8.3*  --   --  7.8*  --  8.5*  PHOS  --   --   --   --   --  2.7  --   --  3.8  --  3.7  AST 15  --   --   --   --  14*  --   --   --   --  16  ALT 11  --   --   --   --  10  --   --   --   --  12   < > = values in this interval not displayed.   Liver Function Tests: Recent Labs  Lab 11/22/18 0244 11/23/18 0530 11/23/18 1711 11/24/18 0432  AST 15 14*  --  16  ALT 11 10  --  12  ALKPHOS 87 54  --  61  BILITOT 0.6 0.8   --  0.8  PROT 7.7 6.1*  --  6.6  ALBUMIN 3.8 2.7*  2.7* 2.8* 2.6*   No results for input(s): LIPASE, AMYLASE in the last 168 hours. No results for input(s): AMMONIA in the last 168 hours. CBC: Recent Labs  Lab 11/22/18 0244  11/22/18 0607  11/23/18 0530  11/23/18 1129 11/24/18 0354 11/24/18 0432  WBC 11.4*  --  12.5*  --  8.2  --   --   --  7.2  NEUTROABS 8.2*  --   --   --  6.5  --   --   --   --   HGB 11.0*   < > 10.3*   < > 8.9*   < > 8.2* 7.8* 7.9*  HCT 35.6*   < > 32.8*   < > 27.2*   < > 24.0* 23.0* 25.3*  MCV 94.9  --  95.6  --  96.5  --   --   --  96.6  PLT 163  --  144*  --  117*  --   --   --  114*   < > = values in this interval not displayed.   Cardiac Enzymes: Recent Labs  Lab 11/22/18 0607 11/22/18 1149 11/22/18 1923 11/23/18 0530 11/23/18 1711  TROPONINI 0.04* 0.08* 0.11* 0.25* 0.15*   CBG: Recent Labs  Lab 11/23/18 1611 11/23/18 1929 11/23/18 2343 11/24/18 0439 11/24/18 0754  GLUCAP 160* 168* 214* 186* 245*    Iron Studies:  Recent Labs    11/23/18 0530  FERRITIN 726*   Studies/Results: Dg Abd 1 View  Result Date: 11/23/2018 CLINICAL DATA:  OG tube placement. EXAM: ABDOMEN - 1 VIEW COMPARISON:  None. FINDINGS: Enteric tube in the stomach. The bowel gas pattern is normal. No radio-opaque calculi or other significant radiographic abnormality are seen. No acute osseous abnormality. IMPRESSION: 1. Enteric tube in the stomach. Electronically Signed   By: Titus Dubin M.D.   On: 11/23/2018 12:25   Dg Chest Port 1 View  Result Date: 11/23/2018 CLINICAL DATA:  Post intubation, OG tube placement EXAM: PORTABLE CHEST 1 VIEW COMPARISON:  11/22/2018 FINDINGS: Right Vas-Cath, endotracheal tube and NG tube remain in place, unchanged. Bilateral airspace disease again noted, worsening in the right lung since prior study. Heart is borderline in size. No acute bony abnormality. IMPRESSION: Support devices are stable. Bilateral airspace disease, worsening in  the right lung since prior study. Electronically Signed   By: Rolm Baptise M.D.   On: 11/23/2018 01:26   Portable Chest X-ray  Result Date: 11/22/2018 CLINICAL DATA:  ET tube, OG tube placement EXAM: PORTABLE CHEST 1 VIEW COMPARISON:  11/22/2018 FINDINGS: Endotracheal tube is 2 cm above the carina. Right Vas-Cath tip is in the SVC. No pneumothorax. OG tube enters the stomach, tip not visualized. Cardiomegaly with bilateral airspace opacities, similar to prior study. Possible small effusions bilaterally. No acute bony abnormality. IMPRESSION: Endotracheal tube 2 cm above the carina. OG tube is in the stomach. Right Vas-Cath tip in the SVC. Bilateral airspace disease is similar to prior study. Mild cardiomegaly. Electronically Signed   By: Rolm Baptise M.D.   On: 11/22/2018 23:12   . amLODipine  5 mg Oral Q breakfast  . aspirin EC  81 mg Oral Daily  . chlorhexidine gluconate (MEDLINE KIT)  15 mL Mouth Rinse BID  . Chlorhexidine Gluconate Cloth  6 each Topical Q0600  . etomidate  20 mg Intravenous Once  . feeding supplement (PRO-STAT SUGAR FREE 64)  60 mL Per Tube QID  . feeding supplement (VITAL 1.5 CAL)  1,000 mL Per Tube Q24H  . gabapentin  300 mg Oral Q T,Th,Sa-HD  . insulin aspart  0-9 Units Subcutaneous Q4H  . insulin glargine  5 Units Subcutaneous Daily  .  mouth rinse  15 mL Mouth Rinse 10 times per day  . methylPREDNISolone (SOLU-MEDROL) injection  40 mg Intravenous Q8H  . midazolam  2 mg Intravenous Once  . multivitamin  1 tablet Oral QHS  . oxyCODONE  5 mg Per Tube Q6H  . pantoprazole sodium  40 mg Per Tube Daily  . rosuvastatin  20 mg Oral Q24H  . sucroferric oxyhydroxide  1,000 mg Oral TID WC    BMET    Component Value Date/Time   NA 134 (L) 11/24/2018 0432   NA 144 08/26/2018 0951   K 4.6 11/24/2018 0432   CL 100 11/24/2018 0432   CO2 24 11/24/2018 0432   GLUCOSE 213 (H) 11/24/2018 0432   BUN 42 (H) 11/24/2018 0432   BUN 28 (H) 08/26/2018 0951   CREATININE 3.96 (H)  11/24/2018 0432   CALCIUM 8.5 (L) 11/24/2018 0432   GFRNONAA 17 (L) 11/24/2018 0432   GFRAA 19 (L) 11/24/2018 0432   CBC    Component Value Date/Time   WBC 7.2 11/24/2018 0432   RBC 2.62 (L) 11/24/2018 0432   HGB 7.9 (L) 11/24/2018 0432   HGB 8.4 (L) 01/14/2017 1046   HCT 25.3 (L) 11/24/2018 0432   HCT 26.0 (L) 01/14/2017 1046   PLT 114 (L) 11/24/2018 0432   PLT 236 01/14/2017 1046   MCV 96.6 11/24/2018 0432   MCV 88 01/14/2017 1046   MCH 30.2 11/24/2018 0432   MCHC 31.2 11/24/2018 0432   RDW 14.6 11/24/2018 0432   RDW 14.1 01/14/2017 1046   LYMPHSABS 1.3 11/23/2018 0530   LYMPHSABS 1.8 01/14/2017 1046   MONOABS 0.3 11/23/2018 0530   EOSABS 0.1 11/23/2018 0530   EOSABS 0.7 (H) 01/14/2017 1046   BASOSABS 0.0 11/23/2018 0530   BASOSABS 0.0 01/14/2017 1046    [facility] 180NRe Optiflux; Time: 3:45 hr; BFR 400; DFR 800; EDW 64.5kg 2K/2 Ca Heparin 2000 unit bolus  Hectorol 2 mcg IV 3x week during HD Venofer 50 mg IV q week Velphoro 500 mg 2 tabs PO TID with meals  Assessment/ Plan:   1. VDRF due to Covid-19- per PCCM, concerning for ARDS.  Off plaquenil due to prolonged QTC. 2. ESRD- currently on CVVHD due to volume overload and respiratory failure due to COVID-19.  Tolerating it well.   1. CVVHD prescription:  2K/2.5Ca for all fluids, UF goal 100 ml/hr, and using heparin 2. Continue with CVVHD for now. 3. Anemia: Hgb dropped to 9.5, cont to follow 4. Hyperkalemia- improved with CVVHD 5. CKD-MBD: stable 6. Nutrition: npo for now 7. Hypertension: improved with UF.    Donetta Potts, MD [employer] [phone]

## 2018-11-24 NOTE — Progress Notes (Signed)
Dialysis Coordinator spoke with CM/S. Claxton and OP HD clinic Social Worker/K. Stultz for continuity of care and insurance coverage questions. Per OP HD Social Worker, patient has Emergency Medicaid, which covers OP HD. He is not eligible to apply for regular Medicaid. He gets his [employer] through [employer] patient assistance program for free and all other medications are purchased by patient at [facility]. If patient is discharged while he is still testing positive for COVID 19 and or prior to being symptom/fever free for 7 days, he will need to be set up at [facility] clinic for OP HD. Please notify Dialysis Coordinator when discharge is approaching.  Alphonzo Cruise Dialysis Coordinator [phone]

## 2018-11-24 NOTE — Progress Notes (Signed)
PEEP dropped per ABG results. Pt tolerating well

## 2018-11-24 NOTE — Progress Notes (Signed)
FPTS Social Note  Patient remains intubated in the ICU and has tested positive for COVID.  FPTS will continue to follow patient's progress and resume care after he is transferred to the floor.  We appreciate the excellent care provided by CCM.  Amanda C. Shan Levans, MD PGY-2, [facility] 11/24/2018 12:43 PM

## 2018-11-25 DIAGNOSIS — J80 Acute respiratory distress syndrome: Secondary | ICD-10-CM

## 2018-11-25 LAB — POCT ACTIVATED CLOTTING TIME
Activated Clotting Time: 175 seconds
Activated Clotting Time: 175 seconds
Activated Clotting Time: 175 seconds
Activated Clotting Time: 186 seconds
Activated Clotting Time: 186 seconds
Activated Clotting Time: 202 seconds
Activated Clotting Time: 191 seconds

## 2018-11-25 LAB — RENAL FUNCTION PANEL
Albumin: 2.7 g/dL — ABNORMAL LOW (ref 3.5–5.0)
Albumin: 3 g/dL — ABNORMAL LOW (ref 3.5–5.0)
Anion gap: 11 (ref 5–15)
Anion gap: 11 (ref 5–15)
BUN: 43 mg/dL — ABNORMAL HIGH (ref 6–20)
BUN: 44 mg/dL — ABNORMAL HIGH (ref 6–20)
CO2: 24 mmol/L (ref 22–32)
CO2: 26 mmol/L (ref 22–32)
Calcium: 8.7 mg/dL — ABNORMAL LOW (ref 8.9–10.3)
Calcium: 9 mg/dL (ref 8.9–10.3)
Chloride: 100 mmol/L (ref 98–111)
Chloride: 99 mmol/L (ref 98–111)
Creatinine, Ser: 2.49 mg/dL — ABNORMAL HIGH (ref 0.61–1.24)
Creatinine, Ser: 2.12 mg/dL — ABNORMAL HIGH (ref 0.61–1.24)
GFR calc Af Amer: 34 mL/min — ABNORMAL LOW (ref >60)
GFR calc Af Amer: 41 mL/min — ABNORMAL LOW (ref >60)
GFR calc non Af Amer: 29 mL/min — ABNORMAL LOW (ref >60)
GFR calc non Af Amer: 35 mL/min — ABNORMAL LOW (ref >60)
Glucose, Bld: 254 mg/dL — ABNORMAL HIGH (ref 70–99)
Glucose, Bld: 291 mg/dL — ABNORMAL HIGH (ref 70–99)
Phosphorus: 2.7 mg/dL (ref 2.5–4.6)
Phosphorus: 2.6 mg/dL (ref 2.5–4.6)
Potassium: 4.1 mmol/L (ref 3.5–5.1)
Potassium: 3.9 mmol/L (ref 3.5–5.1)
Sodium: 135 mmol/L (ref 135–145)
Sodium: 136 mmol/L (ref 135–145)

## 2018-11-25 LAB — INTERLEUKIN-6, PLASMA: Interleukin-6, Plasma: >3000 pg/mL — AB (ref 0.0–12.2)

## 2018-11-25 LAB — MAGNESIUM: Magnesium: 2.9 mg/dL — ABNORMAL HIGH (ref 1.7–2.4)

## 2018-11-25 LAB — CBC
HCT: 27.3 % — ABNORMAL LOW (ref 39.0–52.0)
Hemoglobin: 8.5 g/dL — ABNORMAL LOW (ref 13.0–17.0)
MCH: 30 pg (ref 26.0–34.0)
MCHC: 31.1 g/dL (ref 30.0–36.0)
MCV: 96.5 fL (ref 80.0–100.0)
Platelets: 128 10*3/uL — ABNORMAL LOW (ref 150–400)
RBC: 2.83 MIL/uL — ABNORMAL LOW (ref 4.22–5.81)
RDW: 14.6 % (ref 11.5–15.5)
WBC: 7 10*3/uL (ref 4.0–10.5)
nRBC: 0 % (ref 0.0–0.2)

## 2018-11-25 LAB — APTT: aPTT: 111 seconds — ABNORMAL HIGH (ref 24–36)

## 2018-11-25 LAB — GLUCOSE, CAPILLARY
Glucose-Capillary: 252 mg/dL — ABNORMAL HIGH (ref 70–99)
Glucose-Capillary: 257 mg/dL — ABNORMAL HIGH (ref 70–99)
Glucose-Capillary: 225 mg/dL — ABNORMAL HIGH (ref 70–99)
Glucose-Capillary: 267 mg/dL — ABNORMAL HIGH (ref 70–99)
Glucose-Capillary: 246 mg/dL — ABNORMAL HIGH (ref 70–99)
Glucose-Capillary: 223 mg/dL — ABNORMAL HIGH (ref 70–99)

## 2018-11-25 LAB — HEPATITIS B SURFACE ANTIGEN: Hepatitis B Surface Ag: NEGATIVE

## 2018-11-25 LAB — TRIGLYCERIDES: Triglycerides: 185 mg/dL — ABNORMAL HIGH (ref <150)

## 2018-11-25 MED ORDER — HYDRALAZINE HCL 20 MG/ML IJ SOLN
5.0000 mg | INTRAMUSCULAR | Status: DC | PRN
  Administered 2018-11-25: 5 mg via INTRAVENOUS
  Filled 2018-11-25: qty 1

## 2018-11-25 MED ORDER — HYDRALAZINE HCL 20 MG/ML IJ SOLN
5.0000 mg | INTRAMUSCULAR | Status: DC | PRN
  Administered 2018-11-25 – 2018-11-28 (×4): 10 mg via INTRAVENOUS
  Filled 2018-11-25 (×4): qty 1

## 2018-11-25 MED ORDER — AMLODIPINE BESYLATE 5 MG PO TABS
5.0000 mg | ORAL_TABLET | Freq: Every day | ORAL | Status: DC
  Administered 2018-11-26 – 2018-11-27 (×2): 5 mg via ORAL
  Filled 2018-11-25 (×3): qty 1

## 2018-11-25 MED ORDER — INSULIN GLARGINE 100 UNIT/ML ~~LOC~~ SOLN
10.0000 [IU] | Freq: Every day | SUBCUTANEOUS | Status: DC
  Administered 2018-11-25 – 2018-11-29 (×5): 10 [IU] via SUBCUTANEOUS
  Filled 2018-11-25 (×8): qty 0.1

## 2018-11-25 MED ORDER — CHLORHEXIDINE GLUCONATE CLOTH 2 % EX PADS
6.0000 | MEDICATED_PAD | Freq: Every day | CUTANEOUS | Status: DC
  Administered 2018-11-25 – 2018-11-26 (×2): 6 via TOPICAL

## 2018-11-25 MED ORDER — INSULIN ASPART 100 UNIT/ML ~~LOC~~ SOLN
0.0000 [IU] | SUBCUTANEOUS | Status: DC
  Administered 2018-11-25: 8 [IU] via SUBCUTANEOUS
  Administered 2018-11-25 (×3): 5 [IU] via SUBCUTANEOUS
  Administered 2018-11-26 (×2): 11 [IU] via SUBCUTANEOUS
  Administered 2018-11-26: 8 [IU] via SUBCUTANEOUS
  Administered 2018-11-26: 13:00:00 15 [IU] via SUBCUTANEOUS
  Administered 2018-11-27: 3 [IU] via SUBCUTANEOUS
  Administered 2018-11-27 (×3): 2 [IU] via SUBCUTANEOUS
  Administered 2018-11-27: 5 [IU] via SUBCUTANEOUS
  Administered 2018-11-27: 6 [IU] via SUBCUTANEOUS
  Administered 2018-11-28 (×2): 3 [IU] via SUBCUTANEOUS
  Administered 2018-11-28: 2 [IU] via SUBCUTANEOUS

## 2018-11-25 MED ORDER — MIDAZOLAM HCL 2 MG/2ML IJ SOLN
2.0000 mg | INTRAMUSCULAR | Status: DC | PRN
  Administered 2018-11-25 – 2018-11-26 (×4): 2 mg via INTRAVENOUS
  Filled 2018-11-25 (×2): qty 4
  Filled 2018-11-25: qty 2

## 2018-11-25 MED ORDER — DEXMEDETOMIDINE HCL IN NACL 400 MCG/100ML IV SOLN
0.0000 ug/kg/h | INTRAVENOUS | Status: DC
  Administered 2018-11-25 (×2): 1.2 ug/kg/h via INTRAVENOUS
  Administered 2018-11-25: 10:00:00 0.5 ug/kg/h via INTRAVENOUS
  Administered 2018-11-26: 1 ug/kg/h via INTRAVENOUS
  Filled 2018-11-25 (×5): qty 100

## 2018-11-25 NOTE — Progress Notes (Signed)
Patient ID: Blake Santana, male   DOB: 08-11-1970, 48 y.o.   MRN: [id number] S: no events overnight O:BP (!) 136/59   Pulse 63   Temp 97.6 F (36.4 C)   Resp 16   Ht _0  (1.575 m)   Wt 64.3 kg   SpO2 99%   BMI 25.93 kg/m   Intake/Output Summary (Last 24 hours) at 11/25/2018 1010 Last data filed at 11/25/2018 0900 Gross per 24 hour  Intake 2191.88 ml  Output 4385 ml  Net -2193.12 ml   Intake/Output: I/O last 3 completed shifts: In: 3357.8 [I.V.:1147.9; Other:10; NG/GT:1500; IV Piggyback:699.9] Out: 6259 [Other:6259]  Intake/Output this shift:  Total I/O In: 127.6 [I.V.:67.6; NG/GT:60] Out: 489 [Other:489] Weight change: 1.3 kg Gen: intubated and sedated Direct physical contact was not performed due to covid-19 infection.  Physical exam was discussed with members of the PCCM staff.  Recent Labs  Lab 11/22/18 0244  11/22/18 0607  11/23/18 0530 11/23/18 0658 11/23/18 1129 11/23/18 1711 11/24/18 0354 11/24/18 0432 11/24/18 1700 11/25/18 0334  NA 140   < >  --    < > 137 138 135 136 134* 134* 134* 135  K 5.4*   < >  --    < > 5.0 4.6 5.6* 4.9 4.6 4.6 3.9 4.1  CL 97*  --   --   --  99  --   --  101  --  100 97* 100  CO2 29  --   --   --  26  --   --  20*  --  _1 GLUCOSE 135*  --   --   --  118*  --   --  180*  --  213* 291* 254*  BUN 63*  --   --   --  63*  --   --  41*  --  42* 41* 43*  CREATININE 10.49*  --  10.95*  --  9.18*  --   --  5.80*  --  3.96* 3.00* 2.49*  ALBUMIN 3.8  --   --   --  2.7*  2.7*  --   --  2.8*  --  2.6* 2.8* 2.7*  CALCIUM 10.0  --   --   --  8.3*  --   --  7.8*  --  8.5* 8.6* 8.7*  PHOS  --   --   --   --  2.7  --   --  3.8  --  3.7 3.1 2.7  AST 15  --   --   --  14*  --   --   --   --  16  --   --   ALT 11  --   --   --  10  --   --   --   --  12  --   --    < > = values in this interval not displayed.   Liver Function Tests: Recent Labs  Lab 11/22/18 0244 11/23/18 0530  11/24/18 0432 11/24/18 1700 11/25/18 0334  AST 15  14*  --  16  --   --   ALT 11 10  --  12  --   --   ALKPHOS 87 54  --  61  --   --   BILITOT 0.6 0.8  --  0.8  --   --   PROT 7.7 6.1*  --  6.6  --   --  ALBUMIN 3.8 2.7*  2.7*   < > 2.6* 2.8* 2.7*   < > = values in this interval not displayed.   No results for input(s): LIPASE, AMYLASE in the last 168 hours. No results for input(s): AMMONIA in the last 168 hours. CBC: Recent Labs  Lab 11/22/18 0244  11/22/18 0607  11/23/18 0530  11/24/18 0354 11/24/18 0432 11/25/18 0334  WBC 11.4*  --  12.5*  --  8.2  --   --  7.2 7.0  NEUTROABS 8.2*  --   --   --  6.5  --   --   --   --   HGB 11.0*   < > 10.3*   < > 8.9*   < > 7.8* 7.9* 8.5*  HCT 35.6*   < > 32.8*   < > 27.2*   < > 23.0* 25.3* 27.3*  MCV 94.9  --  95.6  --  96.5  --   --  96.6 96.5  PLT 163  --  144*  --  117*  --   --  114* 128*   < > = values in this interval not displayed.   Cardiac Enzymes: Recent Labs  Lab 11/22/18 0607 11/22/18 1149 11/22/18 1923 11/23/18 0530 11/23/18 1711  TROPONINI 0.04* 0.08* 0.11* 0.25* 0.15*   CBG: Recent Labs  Lab 11/24/18 1544 11/24/18 2008 11/24/18 2332 11/25/18 0331 11/25/18 0747  GLUCAP 261* 280* 253* 252* 257*    Iron Studies:  Recent Labs    11/23/18 0530  FERRITIN 726*   Studies/Results: Dg Abd 1 View  Result Date: 11/23/2018 CLINICAL DATA:  OG tube placement. EXAM: ABDOMEN - 1 VIEW COMPARISON:  None. FINDINGS: Enteric tube in the stomach. The bowel gas pattern is normal. No radio-opaque calculi or other significant radiographic abnormality are seen. No acute osseous abnormality. IMPRESSION: 1. Enteric tube in the stomach. Electronically Signed   By: Titus Dubin M.D.   On: 11/23/2018 12:25   . [START ON 11/26/2018] amLODipine  5 mg Oral Daily  . aspirin EC  81 mg Oral Daily  . chlorhexidine gluconate (MEDLINE KIT)  15 mL Mouth Rinse BID  . Chlorhexidine Gluconate Cloth  6 each Topical Q0600  . etomidate  20 mg Intravenous Once  . feeding supplement (PRO-STAT  SUGAR FREE 64)  60 mL Per Tube QID  . feeding supplement (VITAL 1.5 CAL)  1,000 mL Per Tube Q24H  . gabapentin  300 mg Oral Q T,Th,Sa-HD  . insulin aspart  0-15 Units Subcutaneous Q4H  . insulin glargine  10 Units Subcutaneous Daily  . mouth rinse  15 mL Mouth Rinse 10 times per day  . methylPREDNISolone (SOLU-MEDROL) injection  40 mg Intravenous Q8H  . midazolam  2 mg Intravenous Once  . multivitamin  1 tablet Oral QHS  . oxyCODONE  5 mg Per Tube Q6H  . pantoprazole sodium  40 mg Per Tube Daily  . rosuvastatin  20 mg Oral Q24H  . sucroferric oxyhydroxide  1,000 mg Oral TID WC    BMET    Component Value Date/Time   NA 135 11/25/2018 0334   NA 144 08/26/2018 0951   K 4.1 11/25/2018 0334   CL 100 11/25/2018 0334   CO2 24 11/25/2018 0334   GLUCOSE 254 (H) 11/25/2018 0334   BUN 43 (H) 11/25/2018 0334   BUN 28 (H) 08/26/2018 0951   CREATININE 2.49 (H) 11/25/2018 0334   CALCIUM 8.7 (L) 11/25/2018 0334   GFRNONAA 29 (L) 11/25/2018 3557  GFRAA 34 (L) 11/25/2018 0334   CBC    Component Value Date/Time   WBC 7.0 11/25/2018 0334   RBC 2.83 (L) 11/25/2018 0334   HGB 8.5 (L) 11/25/2018 0334   HGB 8.4 (L) 01/14/2017 1046   HCT 27.3 (L) 11/25/2018 0334   HCT 26.0 (L) 01/14/2017 1046   PLT 128 (L) 11/25/2018 0334   PLT 236 01/14/2017 1046   MCV 96.5 11/25/2018 0334   MCV 88 01/14/2017 1046   MCH 30.0 11/25/2018 0334   MCHC 31.1 11/25/2018 0334   RDW 14.6 11/25/2018 0334   RDW 14.1 01/14/2017 1046   LYMPHSABS 1.3 11/23/2018 0530   LYMPHSABS 1.8 01/14/2017 1046   MONOABS 0.3 11/23/2018 0530   EOSABS 0.1 11/23/2018 0530   EOSABS 0.7 (H) 01/14/2017 1046   BASOSABS 0.0 11/23/2018 0530   BASOSABS 0.0 01/14/2017 1046    [facility] 180NRe Optiflux; Time: 3:45 hr; BFR 400; DFR 800; EDW 64.5kg 2K/2 Ca Heparin 2000 unit bolus  Hectorol 2 mcg IV 3x week during HD Venofer 50 mg IV q week Velphoro 500 mg 2 tabs PO TID with  meals  Assessment/ Plan:  1. VDRF due to Covid-19- per PCCM, concerning for ARDS. Off plaquenil due to prolonged QTC.  PEEP down to 5 this am and hopefully will start weaning protocol tomorrow if he continues to improve 2. ESRD- currently on CVVHD due to volume overload and respiratory failure due to COVID-19. Tolerating it well.  1. CVVHD prescription: 2K/2.5Ca for all fluids, UF goal 100 ml/hr, and using heparin 2. Continue with CVVHD for now. 3. Currently at edw and will decrease UF goal and follow bp. 3. Anemia:Hgb dropped to 9.5, cont to follow 4. Hyperkalemia- improved with CVVHD 5. CKD-MBD:stable 6. Nutrition:npo for now 7. Hypertension:improved with UF.  Donetta Potts, MD [employer] [phone]

## 2018-11-25 NOTE — Progress Notes (Signed)
PCCM Progress note  Patient tolerated pressure support 5/5 for a couple of hours and then became agitated and dyssynchronous Re-sedated and placed back on full vent support.  We will try weans again tomorrow.  He may be getting close to extubation  Quantiferon test noted to be positive.  Doubt this is active TB.  Will send tracheal aspirate for AFB  Marshell Garfinkel MD [facility] 11/25/2018, 5:44 PM

## 2018-11-25 NOTE — Progress Notes (Addendum)
NAME:  Blake Santana, MRN:  [id number], DOB:  1970/10/19, LOS: 3 ADMISSION DATE:  11/22/2018, CONSULTATION DATE:  11/22/2018 REFERRING MD:  Ardelia Mems - FMTS , CHIEF COMPLAINT:  Hypoxia   Brief History   48 year old male patient with ESRD with resp failure, increasing O2 requrements. PCCM consulted and pt intubated  Initial COVID testing was negative, but repeat testing was positive  Past Medical History  ESRD on T/R/Sa iHD  CHF HTN HLD Fe Deficient anemia DM Malnutrition  Significant Hospital Events   4/21> Admitted. Intubated.  4/22 > Tocluzimab X 1, plaquinel held due to prolonged qtc. Started on steroids for 3 days  Consults:  PCCM  Procedures:  OETT 4/21 > Rt radial a line 4/21 > Rt IJ CVL 4/21 >  Significant Diagnostic Tests:  CXR 4/21> bilateral airspace opacities. Cardiomegaly.   Micro Data:  4/21> SARS CoV 2- negative 4/21> REPEAT SARS CoV2>>> Postive 4/21> BCx>>>  Sp Cx 4/21 >>  Antimicrobials:  4/21 Vanc>> 4/21 Cefepime >>  Interim history/subjective:  Continues on CVVH.   Remains agitated overnight> trying to pull out tube.   Objective   Blood pressure (!) 127/56, pulse (!) 58, temperature 98.5 F (36.9 C), temperature source Oral, resp. rate 20, height 5\' 2"  (1.575 m), weight 64.3 kg, SpO2 100 %. CVP:  [2 mmHg] 2 mmHg  Vent Mode: PRVC FiO2 (%):  [40 %] 40 % Set Rate:  [16 bmp] 16 bmp Vt Set:  [440 mL] 440 mL PEEP:  [8 cmH20] 8 cmH20 Plateau Pressure:  [17 cmH20-18 cmH20] 18 cmH20   Intake/Output Summary (Last 24 hours) at 11/25/2018 0839 Last data filed at 11/25/2018 0800 Gross per 24 hour  Intake 2235.5 ml  Output 4239 ml  Net -2003.5 ml   Filed Weights   11/23/18 0500 11/24/18 0444 11/25/18 0315  Weight: 64.5 kg 63 kg 64.3 kg    Examination: Gen:      No acute distress HEENT:  EOMI, sclera anicteric Neck:     No masses; no thyromegaly Lungs:    Clear to auscultation bilaterally; normal respiratory effort CV:         Regular  rate and rhythm; no murmurs Abd:      + bowel sounds; soft, non-tender; no palpable masses, no distension Ext:    No edema; adequate peripheral perfusion Skin:      Warm and dry; no rash Neuro: alert and oriented x 3 Psych: normal mood and affect  Assessment & Plan:  48 year old with acute hypoxic respiratory failure, ARDS secondary to COVID 19  Respiratory failure, ARDS Continue Solu-Medrol x 3 days ARDS net ventilation Wean PEEP/FiO2. Start PSV weans once PEEP is 5 Agitation is barrier to weaning Continue empiric antibiotics (Pct is high), follow ABG Stop vanco as MRSA swab is negative  ESRD on HD (TTS) On CRRT. Net volume removal  Hypertension Hold home metoprolol Continue norvasc  DM Continue SSI coverage. Increase to moderate scale Increase lantus to 10  Agitation Add precedex Wean off propofol, fentanyl, oxy PRN benzos  Goals of care Discussed with wife via interpreter 4/23. She wishes him to remain full code.   Best practice:  Diet: NPO and fluid restriction for now Pain/Anxiety/Delirium protocol (if indicated): will assess need. Propofol, fentanyl, oxy, PRN versed VAP protocol (if indicated): not intubated DVT prophylaxis: heparin [place] on hold due to elevated Acts. Getting heparin via CVVH GI prophylaxis: not currently Glucose control: Lantus, SSI  Mobility: bedrest Code Status: Full code Family Communication:  pt has a wife speaks Spanish primarily. Updated 4/23 Disposition: ICU  Labs   CBC: Recent Labs  Lab 11/22/18 0244  11/22/18 0607  11/23/18 0530 11/23/18 0658 11/23/18 1129 11/24/18 0354 11/24/18 0432 11/25/18 0334  WBC 11.4*  --  12.5*  --  8.2  --   --   --  7.2 7.0  NEUTROABS 8.2*  --   --   --  6.5  --   --   --   --   --   HGB 11.0*   < > 10.3*   < > 8.9* 9.5* 8.2* 7.8* 7.9* 8.5*  HCT 35.6*   < > 32.8*   < > 27.2* 28.0* 24.0* 23.0* 25.3* 27.3*  MCV 94.9  --  95.6  --  96.5  --   --   --  96.6 96.5  PLT 163  --  144*  --  117*  --   --    --  114* 128*   < > = values in this interval not displayed.    Basic Metabolic Panel: Recent Labs  Lab 11/23/18 0530  11/23/18 1711 11/23/18 2155 11/24/18 0354 11/24/18 0432 11/24/18 1700 11/25/18 0334  NA 137   < > 136  --  134* 134* 134* 135  K 5.0   < > 4.9  --  4.6 4.6 3.9 4.1  CL 99  --  101  --   --  100 97* 100  CO2 26  --  20*  --   --  24 25 24   GLUCOSE 118*  --  180*  --   --  213* 291* 254*  BUN 63*  --  41*  --   --  42* 41* 43*  CREATININE 9.18*  --  5.80*  --   --  3.96* 3.00* 2.49*  CALCIUM 8.3*  --  7.8*  --   --  8.5* 8.6* 8.7*  MG 2.5*  --   --  2.6*  --  2.5*  --  2.9*  PHOS 2.7  --  3.8  --   --  3.7 3.1 2.7   < > = values in this interval not displayed.   GFR: Estimated Creatinine Clearance: 27.4 mL/min (A) (by C-G formula based on SCr of 2.49 mg/dL (H)). Recent Labs  Lab 11/22/18 0244 11/22/18 0607 11/22/18 0730 11/22/18 1923 11/23/18 0530 11/24/18 0432 11/25/18 0334  PROCALCITON  --   --   --  2.15 8.64 29.94  --   WBC 11.4* 12.5*  --   --  8.2 7.2 7.0  LATICACIDVEN 1.0  --  0.9  --   --   --   --     Liver Function Tests: Recent Labs  Lab 11/22/18 0244 11/23/18 0530 11/23/18 1711 11/24/18 0432 11/24/18 1700 11/25/18 0334  AST 15 14*  --  16  --   --   ALT 11 10  --  12  --   --   ALKPHOS 87 54  --  61  --   --   BILITOT 0.6 0.8  --  0.8  --   --   PROT 7.7 6.1*  --  6.6  --   --   ALBUMIN 3.8 2.7*  2.7* 2.8* 2.6* 2.8* 2.7*   No results for input(s): LIPASE, AMYLASE in the last 168 hours. No results for input(s): AMMONIA in the last 168 hours.  ABG    Component Value Date/Time   PHART 7.393 11/24/2018  0354   PCO2ART 43.8 11/24/2018 0354   PO2ART 116.0 (H) 11/24/2018 0354   HCO3 26.7 11/24/2018 0354   TCO2 28 11/24/2018 0354   O2SAT 98.0 11/24/2018 0354     Coagulation Profile: No results for input(s): INR, PROTIME in the last 168 hours.  Cardiac Enzymes: Recent Labs  Lab 11/22/18 0607 11/22/18 1149 11/22/18 1923  11/23/18 0530 11/23/18 1711  TROPONINI 0.04* 0.08* 0.11* 0.25* 0.15*    HbA1C: Hgb A1c MFr Bld  Date/Time Value Ref Range Status  11/22/2018 06:10 AM 7.1 (H) 4.8 - 5.6 % Final    Comment:    (NOTE) Pre diabetes:          5.7%-6.4% Diabetes:              >6.4% Glycemic control for   <7.0% adults with diabetes   08/26/2018 09:51 AM 7.4 (H) 4.8 - 5.6 % Final    Comment:             Prediabetes: 5.7 - 6.4          Diabetes: >6.4          Glycemic control for adults with diabetes: <7.0     CBG: Recent Labs  Lab 11/24/18 1544 11/24/18 2008 11/24/18 2332 11/25/18 0331 11/25/18 0747  GLUCAP 261* 280* 253* 252* 257*   The patient is critically ill with multiple organ system failure and requires high complexity decision making for assessment and support, frequent evaluation and titration of therapies, advanced monitoring, review of radiographic studies and interpretation of complex data.   Critical Care Time devoted to patient care services, exclusive of separately billable procedures, described in this note is 35 minutes.   Marshell Garfinkel MD [facility] Pager [phone] If no answer call [phone] 11/25/2018, 8:52 AM

## 2018-11-25 NOTE — Progress Notes (Signed)
FPTS social note  Patient remains intubated in the ICU following positive COVID test.  FPTS will continue following patient's progress and will resume care once he is extubated and appropriate for transfer out of the ICU.  We appreciate the excellent care provided by PCCM.  Harriet Butte, [place], PGY-3

## 2018-11-25 NOTE — Progress Notes (Signed)
Inpatient Diabetes Program Recommendations  AACE/ADA: New Consensus Statement on Inpatient Glycemic Control (2015)  Target Ranges:  Prepandial:   less than 140 mg/dL      Peak postprandial:   less than 180 mg/dL (1-2 hours)      Critically ill patients:  140 - 180 mg/dL   Lab Results  Component Value Date   GLUCAP 257 (H) 11/25/2018   HGBA1C 7.1 (H) 11/22/2018    Review of Glycemic Control Results for ACEVEDO ASWAD, WANDREY (MRN [id number]) as of 11/25/2018 10:31  Ref. Range 11/24/2018 15:44 11/24/2018 20:08 11/24/2018 23:32 11/25/2018 03:31 11/25/2018 07:47  Glucose-Capillary Latest Ref Range: 70 - 99 mg/dL 261 (H) 280 (H) 253 (H) 252 (H) 257 (H)    Inpatient Diabetes Program Recommendations:   If CBGs continue >200 with increase in Lantus & correction: -Consider Novolog 3 units tube feed coverage q 4 hrs.(hold if tubefeed held or stopped for any reason)  Thank you, Bethena Roys E. Keawe Marcello, RN, MSN, CDE  Diabetes Coordinator Inpatient Glycemic Control Team Team Pager [phone] (8am-5pm) 11/25/2018 10:52 AM

## 2018-11-25 NOTE — Progress Notes (Signed)
Pts spouse updated via interpreter on condition

## 2018-11-26 ENCOUNTER — Inpatient Hospital Stay (HOSPITAL_COMMUNITY): Payer: Medicaid Other

## 2018-11-26 LAB — QUANTIFERON-TB GOLD PLUS (RQFGPL)
QuantiFERON Mitogen Value: 0.34 IU/mL
QuantiFERON Nil Value: 0.01 IU/mL
QuantiFERON TB1 Ag Value: 0.02 IU/mL
QuantiFERON TB2 Ag Value: 0.02 IU/mL

## 2018-11-26 LAB — CBC
HCT: 32.8 % — ABNORMAL LOW (ref 39.0–52.0)
Hemoglobin: 10.5 g/dL — ABNORMAL LOW (ref 13.0–17.0)
MCH: 30.2 pg (ref 26.0–34.0)
MCHC: 32 g/dL (ref 30.0–36.0)
MCV: 94.3 fL (ref 80.0–100.0)
Platelets: 114 10*3/uL — ABNORMAL LOW (ref 150–400)
RBC: 3.48 MIL/uL — ABNORMAL LOW (ref 4.22–5.81)
RDW: 14.1 % (ref 11.5–15.5)
WBC: 9 10*3/uL (ref 4.0–10.5)
nRBC: 0 % (ref 0.0–0.2)

## 2018-11-26 LAB — RENAL FUNCTION PANEL
Albumin: 3 g/dL — ABNORMAL LOW (ref 3.5–5.0)
Albumin: 3.1 g/dL — ABNORMAL LOW (ref 3.5–5.0)
Anion gap: 13 (ref 5–15)
Anion gap: 15 (ref 5–15)
BUN: 48 mg/dL — ABNORMAL HIGH (ref 6–20)
BUN: 94 mg/dL — ABNORMAL HIGH (ref 6–20)
CO2: 24 mmol/L (ref 22–32)
CO2: 23 mmol/L (ref 22–32)
Calcium: 9 mg/dL (ref 8.9–10.3)
Calcium: 9.2 mg/dL (ref 8.9–10.3)
Chloride: 98 mmol/L (ref 98–111)
Chloride: 97 mmol/L — ABNORMAL LOW (ref 98–111)
Creatinine, Ser: 1.91 mg/dL — ABNORMAL HIGH (ref 0.61–1.24)
Creatinine, Ser: 3.24 mg/dL — ABNORMAL HIGH (ref 0.61–1.24)
GFR calc Af Amer: 46 mL/min — ABNORMAL LOW (ref >60)
GFR calc Af Amer: 24 mL/min — ABNORMAL LOW (ref >60)
GFR calc non Af Amer: 40 mL/min — ABNORMAL LOW (ref >60)
GFR calc non Af Amer: 21 mL/min — ABNORMAL LOW (ref >60)
Glucose, Bld: 284 mg/dL — ABNORMAL HIGH (ref 70–99)
Glucose, Bld: 324 mg/dL — ABNORMAL HIGH (ref 70–99)
Phosphorus: 2.2 mg/dL — ABNORMAL LOW (ref 2.5–4.6)
Phosphorus: 3.3 mg/dL (ref 2.5–4.6)
Potassium: 3 mmol/L — ABNORMAL LOW (ref 3.5–5.1)
Potassium: 4.3 mmol/L (ref 3.5–5.1)
Sodium: 135 mmol/L (ref 135–145)
Sodium: 135 mmol/L (ref 135–145)

## 2018-11-26 LAB — GLUCOSE, CAPILLARY
Glucose-Capillary: 263 mg/dL — ABNORMAL HIGH (ref 70–99)
Glucose-Capillary: 326 mg/dL — ABNORMAL HIGH (ref 70–99)
Glucose-Capillary: 469 mg/dL — ABNORMAL HIGH (ref 70–99)
Glucose-Capillary: 326 mg/dL — ABNORMAL HIGH (ref 70–99)
Glucose-Capillary: 91 mg/dL (ref 70–99)

## 2018-11-26 LAB — MAGNESIUM: Magnesium: 2.8 mg/dL — ABNORMAL HIGH (ref 1.7–2.4)

## 2018-11-26 LAB — CULTURE, RESPIRATORY

## 2018-11-26 LAB — APTT: aPTT: >200 seconds (ref 24–36)

## 2018-11-26 LAB — POCT I-STAT 7, (LYTES, BLD GAS, ICA,H+H)
Acid-Base Excess: 1 mmol/L (ref 0.0–2.0)
Bicarbonate: 26.9 mmol/L (ref 20.0–28.0)
Calcium, Ion: 1.22 mmol/L (ref 1.15–1.40)
HCT: 32 % — ABNORMAL LOW (ref 39.0–52.0)
Hemoglobin: 10.9 g/dL — ABNORMAL LOW (ref 13.0–17.0)
O2 Saturation: 97 %
Patient temperature: 97.9
Potassium: 3.1 mmol/L — ABNORMAL LOW (ref 3.5–5.1)
Sodium: 137 mmol/L (ref 135–145)
TCO2: 28 mmol/L (ref 22–32)
pCO2 arterial: 45.1 mmHg (ref 32.0–48.0)
pH, Arterial: 7.383 (ref 7.350–7.450)
pO2, Arterial: 87 mmHg (ref 83.0–108.0)

## 2018-11-26 LAB — ACID FAST SMEAR (AFB, MYCOBACTERIA): Acid Fast Smear: NEGATIVE

## 2018-11-26 LAB — QUANTIFERON-TB GOLD PLUS: QuantiFERON-TB Gold Plus: UNDETERMINED

## 2018-11-26 LAB — POCT ACTIVATED CLOTTING TIME
Activated Clotting Time: 186 seconds
Activated Clotting Time: 213 seconds

## 2018-11-26 LAB — CULTURE, RESPIRATORY W GRAM STAIN: Culture: NORMAL

## 2018-11-26 MED ORDER — ORAL CARE MOUTH RINSE
15.0000 mL | Freq: Two times a day (BID) | OROMUCOSAL | Status: DC
  Administered 2018-11-26 – 2018-11-27 (×2): 15 mL via OROMUCOSAL

## 2018-11-26 MED ORDER — SUCROFERRIC OXYHYDROXIDE 500 MG PO CHEW
1000.0000 mg | CHEWABLE_TABLET | Freq: Three times a day (TID) | ORAL | Status: DC
  Administered 2018-11-27 – 2018-11-30 (×11): 1000 mg via ORAL
  Filled 2018-11-26 (×16): qty 2

## 2018-11-26 MED ORDER — ONDANSETRON HCL 4 MG/2ML IJ SOLN
INTRAMUSCULAR | Status: AC
  Filled 2018-11-26: qty 2

## 2018-11-26 MED ORDER — ONDANSETRON HCL 4 MG/2ML IJ SOLN
4.0000 mg | Freq: Three times a day (TID) | INTRAMUSCULAR | Status: DC | PRN
  Administered 2018-11-26: 4 mg via INTRAVENOUS

## 2018-11-26 MED ORDER — GABAPENTIN 250 MG/5ML PO SOLN
300.0000 mg | Freq: Once | ORAL | Status: AC
  Administered 2018-11-26: 300 mg via ORAL
  Filled 2018-11-26: qty 6

## 2018-11-26 MED ORDER — CHLORHEXIDINE GLUCONATE CLOTH 2 % EX PADS
6.0000 | MEDICATED_PAD | Freq: Every day | CUTANEOUS | Status: DC
  Administered 2018-11-27: 09:00:00 6 via TOPICAL

## 2018-11-26 MED ORDER — OXYCODONE HCL 5 MG/5ML PO SOLN: 5.0000 mg | Freq: Four times a day (QID) | ORAL | Status: DC

## 2018-11-26 MED ORDER — POTASSIUM CHLORIDE 20 MEQ/15ML (10%) PO SOLN
40.0000 meq | ORAL | Status: AC
  Administered 2018-11-26 (×2): 40 meq
  Filled 2018-11-26: qty 30

## 2018-11-26 MED ORDER — SODIUM CHLORIDE 0.9% FLUSH
10.0000 mL | INTRAVENOUS | Status: DC | PRN
  Administered 2018-11-27: 10 mL
  Filled 2018-11-26: qty 40

## 2018-11-26 MED ORDER — POTASSIUM PHOSPHATES 15 MMOLE/5ML IV SOLN
30.0000 mmol | Freq: Once | INTRAVENOUS | Status: AC
  Administered 2018-11-26: 06:00:00 30 mmol via INTRAVENOUS
  Filled 2018-11-26: qty 10

## 2018-11-26 MED ORDER — ENOXAPARIN SODIUM 30 MG/0.3ML ~~LOC~~ SOLN
30.0000 mg | SUBCUTANEOUS | Status: DC
  Administered 2018-11-26 – 2018-11-27 (×2): 30 mg via SUBCUTANEOUS
  Filled 2018-11-26 (×2): qty 0.3

## 2018-11-26 MED ORDER — SODIUM CHLORIDE 0.9% FLUSH
10.0000 mL | Freq: Two times a day (BID) | INTRAVENOUS | Status: DC
  Administered 2018-11-27: 10 mL

## 2018-11-26 MED ORDER — SENNOSIDES-DOCUSATE SODIUM 8.6-50 MG PO TABS
1.0000 | ORAL_TABLET | Freq: Two times a day (BID) | ORAL | Status: DC
  Administered 2018-11-26 – 2018-12-01 (×11): 1 via ORAL
  Filled 2018-11-26 (×11): qty 1

## 2018-11-26 MED ORDER — OXYCODONE HCL 5 MG/5ML PO SOLN
5.0000 mg | Freq: Four times a day (QID) | ORAL | Status: DC | PRN
  Administered 2018-11-28 (×2): 5 mg via ORAL
  Filled 2018-11-26 (×2): qty 5

## 2018-11-26 NOTE — Progress Notes (Signed)
Pt tolerating ice chips and sips of water without S&S of aspiration/coughing/choking.

## 2018-11-26 NOTE — Progress Notes (Signed)
Patient ID: Blake Santana, male   DOB: 04-Jun-1971, 48 y.o.   MRN: [id number] S: Off of CVVHD this morning.  To start weaning trials O:BP 135/67   Pulse 70   Temp (!) 96.9 F (36.1 C) (Oral)   Resp 13   Ht '5\' 2"'  (1.575 m)   Wt 60.7 kg   SpO2 97%   BMI 24.48 kg/m   Intake/Output Summary (Last 24 hours) at 11/26/2018 0947 Last data filed at 11/26/2018 0900 Gross per 24 hour  Intake 2449.52 ml  Output 4359 ml  Net -1909.48 ml   Intake/Output: I/O last 3 completed shifts: In: 3445.8 [I.V.:1418.6; Other:20; NG/GT:1505; IV Piggyback:502.1] Out: 0086 [Other:7250]  Intake/Output this shift:  Total I/O In: 346.2 [I.V.:40.3; NG/GT:140; IV Piggyback:165.9] Out: -  Weight change: -3.6 kg Gen: intubated and resting comfortably Direct physical contact was not performed due to covid-19 infection.  Case was discussed with members of the PCCM staff.  Recent Labs  Lab 11/22/18 0244  11/23/18 0530  11/23/18 1711 11/24/18 0354 11/24/18 0432 11/24/18 1700 11/25/18 0334 11/25/18 1600 11/26/18 0310 11/26/18 0313  NA 140   < > 137   < > 136 134* 134* 134* 135 136 137 135  K 5.4*   < > 5.0   < > 4.9 4.6 4.6 3.9 4.1 3.9 3.1* 3.0*  CL 97*  --  99  --  101  --  100 97* 100 99  --  98  CO2 29  --  26  --  20*  --  '24 25 24 26  ' --  24  GLUCOSE 135*  --  118*  --  180*  --  213* 291* 254* 291*  --  284*  BUN 63*  --  63*  --  41*  --  42* 41* 43* 44*  --  48*  CREATININE 10.49*   < > 9.18*  --  5.80*  --  3.96* 3.00* 2.49* 2.12*  --  1.91*  ALBUMIN 3.8  --  2.7*  2.7*  --  2.8*  --  2.6* 2.8* 2.7* 3.0*  --  3.0*  CALCIUM 10.0  --  8.3*  --  7.8*  --  8.5* 8.6* 8.7* 9.0  --  9.0  PHOS  --   --  2.7  --  3.8  --  3.7 3.1 2.7 2.6  --  2.2*  AST 15  --  14*  --   --   --  16  --   --   --   --   --   ALT 11  --  10  --   --   --  12  --   --   --   --   --    < > = values in this interval not displayed.   Liver Function Tests: Recent Labs  Lab 11/22/18 0244 11/23/18 0530  11/24/18 0432   11/25/18 0334 11/25/18 1600 11/26/18 0313  AST 15 14*  --  16  --   --   --   --   ALT 11 10  --  12  --   --   --   --   ALKPHOS 87 54  --  61  --   --   --   --   BILITOT 0.6 0.8  --  0.8  --   --   --   --   PROT 7.7 6.1*  --  6.6  --   --   --   --  ALBUMIN 3.8 2.7*  2.7*   < > 2.6*   < > 2.7* 3.0* 3.0*   < > = values in this interval not displayed.   No results for input(s): LIPASE, AMYLASE in the last 168 hours. No results for input(s): AMMONIA in the last 168 hours. CBC: Recent Labs  Lab 11/22/18 0244  11/22/18 0607  11/23/18 0530  11/24/18 0432 11/25/18 0334 11/26/18 0310 11/26/18 0313  WBC 11.4*  --  12.5*  --  8.2  --  7.2 7.0  --  9.0  NEUTROABS 8.2*  --   --   --  6.5  --   --   --   --   --   HGB 11.0*   < > 10.3*   < > 8.9*   < > 7.9* 8.5* 10.9* 10.5*  HCT 35.6*   < > 32.8*   < > 27.2*   < > 25.3* 27.3* 32.0* 32.8*  MCV 94.9  --  95.6  --  96.5  --  96.6 96.5  --  94.3  PLT 163  --  144*  --  117*  --  114* 128*  --  114*   < > = values in this interval not displayed.   Cardiac Enzymes: Recent Labs  Lab 11/22/18 0607 11/22/18 1149 11/22/18 1923 11/23/18 0530 11/23/18 1711  TROPONINI 0.04* 0.08* 0.11* 0.25* 0.15*   CBG: Recent Labs  Lab 11/25/18 1616 11/25/18 2004 11/25/18 2316 11/26/18 0308 11/26/18 0758  GLUCAP 267* 246* 223* 263* 326*    Iron Studies: No results for input(s): IRON, TIBC, TRANSFERRIN, FERRITIN in the last 72 hours. Studies/Results: Dg Chest Port 1 View  Result Date: 11/26/2018 CLINICAL DATA:  Respiratory failure EXAM: PORTABLE CHEST 1 VIEW COMPARISON:  November 23, 2018 FINDINGS: The ETT terminates 2.3 cm above the carina in good position. The NG tube terminates in the stomach. The right central line is stable. No pneumothorax. Mild opacity in left base is less prominent the interval the retrocardiac opacity prior persists. Right-sided infiltrate has significantly improved. However, infiltrate remains in the right base.  IMPRESSION: 1. Support apparatus as above. 2. Improving infiltrate on the right. However, infiltrate remains in the right base. 3. Suspected left retrocardiac opacity also appears less prominent in the interval. Electronically Signed   By: Dorise Bullion III M.D   On: 11/26/2018 05:49   . amLODipine  5 mg Oral Daily  . aspirin EC  81 mg Oral Daily  . chlorhexidine gluconate (MEDLINE KIT)  15 mL Mouth Rinse BID  . Chlorhexidine Gluconate Cloth  6 each Topical Q0600  . etomidate  20 mg Intravenous Once  . feeding supplement (PRO-STAT SUGAR FREE 64)  60 mL Per Tube QID  . feeding supplement (VITAL 1.5 CAL)  1,000 mL Per Tube Q24H  . gabapentin  300 mg Oral Q T,Th,Sa-HD  . insulin aspart  0-15 Units Subcutaneous Q4H  . insulin glargine  10 Units Subcutaneous Daily  . mouth rinse  15 mL Mouth Rinse 10 times per day  . midazolam  2 mg Intravenous Once  . multivitamin  1 tablet Oral QHS  . oxyCODONE  5 mg Per Tube Q6H  . pantoprazole sodium  40 mg Per Tube Daily  . rosuvastatin  20 mg Oral Q24H  . sucroferric oxyhydroxide  1,000 mg Oral TID WC    BMET    Component Value Date/Time   NA 135 11/26/2018 0313   NA 144 08/26/2018 0951   K  3.0 (L) 11/26/2018 0313   CL 98 11/26/2018 0313   CO2 24 11/26/2018 0313   GLUCOSE 284 (H) 11/26/2018 0313   BUN 48 (H) 11/26/2018 0313   BUN 28 (H) 08/26/2018 0951   CREATININE 1.91 (H) 11/26/2018 0313   CALCIUM 9.0 11/26/2018 0313   GFRNONAA 40 (L) 11/26/2018 0313   GFRAA 46 (L) 11/26/2018 0313   CBC    Component Value Date/Time   WBC 9.0 11/26/2018 0313   RBC 3.48 (L) 11/26/2018 0313   HGB 10.5 (L) 11/26/2018 0313   HGB 8.4 (L) 01/14/2017 1046   HCT 32.8 (L) 11/26/2018 0313   HCT 26.0 (L) 01/14/2017 1046   PLT 114 (L) 11/26/2018 0313   PLT 236 01/14/2017 1046   MCV 94.3 11/26/2018 0313   MCV 88 01/14/2017 1046   MCH 30.2 11/26/2018 0313   MCHC 32.0 11/26/2018 0313   RDW 14.1 11/26/2018 0313   RDW 14.1 01/14/2017 1046   LYMPHSABS 1.3  11/23/2018 0530   LYMPHSABS 1.8 01/14/2017 1046   MONOABS 0.3 11/23/2018 0530   EOSABS 0.1 11/23/2018 0530   EOSABS 0.7 (H) 01/14/2017 1046   BASOSABS 0.0 11/23/2018 0530   BASOSABS 0.0 01/14/2017 1046   [facility]Re Optiflux; Time: 3:45 hr; BFR 400; DFR 800; EDW 64.5kg 2K/2 Ca Heparin 2000 unit bolus  Hectorol 2 mcg IV 3x week during HD Venofer 50 mg IV q week Velphoro 500 mg 2 tabs PO TID with meals  Assessment/ Plan:  1. VDRF due to Covid-19- per PCCM, concerning for ARDS. Off plaquenil due to prolonged QTC.  PEEP down to 5 this am and hopefully will be able to extubate later today 2. ESRD- started CVVHD on 11/22/18 due to volume overload and respiratory failure due to COVID-19. Discontinued this morning around 6:20 due to improved BP and volume status.  1. Will hopefully transition to IHD by Monday 2. He is 3.5 kg below his edw 3. Anemia:Hgb up to 10.5 4. Hyperkalemia- improved with CVVHD 5. CKD-MBD:stable 6. Nutrition:npo for now 7. Hypertension:improved with UF. 8. Quantiferon-TB Gold positive- awaiting AFB smear and cultures from ETT.  Donetta Potts, MD [employer] [phone]

## 2018-11-26 NOTE — Procedures (Signed)
Extubation Procedure Note  Patient Details:   Name: Blake Santana DOB: May 25, 1971 MRN: [id number]   Airway Documentation:    Vent end date: 11/26/18 Vent end time: 1127   Evaluation  O2 sats: stable throughout Complications: No apparent complications Patient did tolerate procedure well. Bilateral Breath Sounds: Clear, Diminished   Yes   Positive cuff leak noted. Pt placed on [place] 2 L with humidity, no stridor noted. Patient able to reach 1250 mL using incentive spirometer.  Mingo Amber Ragan Duhon 11/26/2018, 11:32 AM

## 2018-11-26 NOTE — Progress Notes (Signed)
eLink Physician-Brief Progress Note Patient Name: Blake Santana DOB: Sep 09, 1970 MRN: [id number]   Date of Service  11/26/2018  HPI/Events of Note  Hypokalemia and hypophos  eICU Interventions  Potassium and phos replaced     Intervention Category Intermediate Interventions: Electrolyte abnormality - evaluation and management  Natasia Sanko 11/26/2018, 5:07 AM

## 2018-11-26 NOTE — Progress Notes (Signed)
Assisted tele visit to patient with family member.  Laasia Arcos Ann, RN  

## 2018-11-26 NOTE — Progress Notes (Signed)
NAME:  Blake Santana, MRN:  [id number], DOB:  [birth year], LOS: 4 ADMISSION DATE:  11/22/2018, CONSULTATION DATE:  11/22/2018 REFERRING MD:  Ardelia Mems - FMTS , CHIEF COMPLAINT:  Hypoxia   Brief History   48 year old male patient with ESRD with resp failure, increasing O2 requrements. PCCM consulted and pt intubated  Initial COVID testing was negative, but repeat testing was positive  Past Medical History  ESRD on T/R/Sa iHD  CHF HTN HLD Fe Deficient anemia DM Malnutrition  Significant Hospital Events   4/21> Admitted. Intubated.  4/22 > Tocluzimab X 1, plaquinel held due to prolonged qtc. Started on steroids for 3 days  Consults:  PCCM  Procedures:  OETT 4/21 > Rt radial a line 4/21 > Rt IJ CVL 4/21 >  Significant Diagnostic Tests:  CXR 4/21> bilateral airspace opacities. Cardiomegaly.   Micro Data:  4/21> SARS CoV 2- negative 4/21> REPEAT SARS CoV2>>> Postive 4/21> BCx>>>  Sp Cx 4/21 >>  Antimicrobials:  4/21 Vanc>> 4/21 Cefepime >>  Interim history/subjective:  Off CRRT. Awake and alert.  Passed SBT and was extubated. Comfortable without dyspnea post extubation.  Complains only of sore throat and burping. Passed bedside swallow evaluation.  Objective   Blood pressure (!) 161/78, pulse (!) 107, temperature (!) 96.9 F (36.1 C), temperature source Oral, resp. rate 20, height 5\' 2"  (1.575 m), weight 60.7 kg, SpO2 91 %. CVP:  [1 mmHg] 1 mmHg  Vent Mode: PSV;CPAP FiO2 (%):  [40 %] 40 % Set Rate:  [16 bmp] 16 bmp Vt Set:  [440 mL] 440 mL PEEP:  [5 cmH20] 5 cmH20 Pressure Support:  [5 cmH20] 5 cmH20 Plateau Pressure:  [16 cmH20-18 cmH20] 16 cmH20   Intake/Output Summary (Last 24 hours) at 11/26/2018 1627 Last data filed at 11/26/2018 1504 Gross per 24 hour  Intake 2606.18 ml  Output 2965 ml  Net -358.82 ml   Filed Weights   11/24/18 0444 11/25/18 0315 11/26/18 0200  Weight: 63 kg 64.3 kg 60.7 kg    Examination: Gen:      No acute distress.   Thin. HEENT:  EOMI, sclera anicteric Neck:     No masses; no thyromegaly Lungs:   Crackles at both bases.  No respiratory distress. CV:         Regular rate and rhythm; no murmurs.  Normal capillary refill. Abd:      + bowel sounds; soft, non-tender; no palpable masses, no distension Ext:    No edema; adequate peripheral perfusion Skin:      Warm and dry; no rash Neuro: alert and oriented x 3 Psych: normal mood and affect  Assessment & Plan:  48 year old with acute hypoxic respiratory failure, ARDS secondary to COVID 19  Critically ill due to acute hypoxic respiratory failure due to ARDS from COVID-19.   Improved and successfully extubated. Antibiotics have been stopped.   Empiric antibiotics have been discontinued given negative cultures. Patient has passed an SBT and has been successfully extubated. Requires ongoing observation as remains at risk for respiratory decompensation.  ESRD on HD (TTS) Transition to intermittent dialysis  Hypertension Resume home antihypertensive regimen.  DM Control is suboptimal. Hold on further insulin increase now that extubated and tube feeds are off. Adjust according to oral intake.  Goals of care Discussed with wife via interpreter 4/23. She wishes him to remain full code.   Best practice:  Diet: NPO and fluid restriction for now Pain/Anxiety/Delirium protocol (if indicated): will assess need.  Off all continuous  infusions  VAP protocol (if indicated): Discontinued DVT prophylaxis: Unfractionated heparin.   GI prophylaxis: Not indicated. Glucose control: Lantus, SSI  Mobility: bedrest Code Status: Full code Family Communication: Patient updated. Disposition: ICU  Labs   CBC: Recent Labs  Lab 11/22/18 0244  11/22/18 0607  11/23/18 0530  11/24/18 0354 11/24/18 0432 11/25/18 0334 11/26/18 0310 11/26/18 0313  WBC 11.4*  --  12.5*  --  8.2  --   --  7.2 7.0  --  9.0  NEUTROABS 8.2*  --   --   --  6.5  --   --   --   --   --    --   HGB 11.0*   < > 10.3*   < > 8.9*   < > 7.8* 7.9* 8.5* 10.9* 10.5*  HCT 35.6*   < > 32.8*   < > 27.2*   < > 23.0* 25.3* 27.3* 32.0* 32.8*  MCV 94.9  --  95.6  --  96.5  --   --  96.6 96.5  --  94.3  PLT 163  --  144*  --  117*  --   --  114* 128*  --  114*   < > = values in this interval not displayed.    Basic Metabolic Panel: Recent Labs  Lab 11/23/18 0530  11/23/18 2155  11/24/18 0432 11/24/18 1700 11/25/18 0334 11/25/18 1600 11/26/18 0310 11/26/18 0313 11/26/18 1523  NA 137   < >  --    < > 134* 134* 135 136 137 135 135  K 5.0   < >  --    < > 4.6 3.9 4.1 3.9 3.1* 3.0* 4.3  CL 99   < >  --   --  100 97* 100 99  --  98 97*  CO2 26   < >  --   --  24 25 24 26   --  24 23  GLUCOSE 118*   < >  --   --  213* 291* 254* 291*  --  284* 324*  BUN 63*   < >  --   --  42* 41* 43* 44*  --  48* 94*  CREATININE 9.18*   < >  --   --  3.96* 3.00* 2.49* 2.12*  --  1.91* 3.24*  CALCIUM 8.3*   < >  --   --  8.5* 8.6* 8.7* 9.0  --  9.0 9.2  MG 2.5*  --  2.6*  --  2.5*  --  2.9*  --   --  2.8*  --   PHOS 2.7   < >  --   --  3.7 3.1 2.7 2.6  --  2.2* 3.3   < > = values in this interval not displayed.   GFR: Estimated Creatinine Clearance: 21.1 mL/min (A) (by C-G formula based on SCr of 3.24 mg/dL (H)). Recent Labs  Lab 11/22/18 0244  11/22/18 0730 11/22/18 1923 11/23/18 0530 11/24/18 0432 11/25/18 0334 11/26/18 0313  PROCALCITON  --   --   --  2.15 8.64 29.94  --   --   WBC 11.4*   < >  --   --  8.2 7.2 7.0 9.0  LATICACIDVEN 1.0  --  0.9  --   --   --   --   --    < > = values in this interval not displayed.    Liver Function Tests: Recent Labs  Lab 11/22/18 0244  11/23/18 0530  11/24/18 0432 11/24/18 1700 11/25/18 0334 11/25/18 1600 11/26/18 0313 11/26/18 1523  AST 15 14*  --  16  --   --   --   --   --   ALT 11 10  --  12  --   --   --   --   --   ALKPHOS 87 54  --  61  --   --   --   --   --   BILITOT 0.6 0.8  --  0.8  --   --   --   --   --   PROT 7.7 6.1*  --  6.6   --   --   --   --   --   ALBUMIN 3.8 2.7*   2.7*   < > 2.6* 2.8* 2.7* 3.0* 3.0* 3.1*   < > = values in this interval not displayed.   No results for input(s): LIPASE, AMYLASE in the last 168 hours. No results for input(s): AMMONIA in the last 168 hours.  ABG    Component Value Date/Time   PHART 7.383 11/26/2018 0310   PCO2ART 45.1 11/26/2018 0310   PO2ART 87.0 11/26/2018 0310   HCO3 26.9 11/26/2018 0310   TCO2 28 11/26/2018 0310   O2SAT 97.0 11/26/2018 0310     Coagulation Profile: No results for input(s): INR, PROTIME in the last 168 hours.  Cardiac Enzymes: Recent Labs  Lab 11/22/18 0607 11/22/18 1149 11/22/18 1923 11/23/18 0530 11/23/18 1711  TROPONINI 0.04* 0.08* 0.11* 0.25* 0.15*    HbA1C: Hgb A1c MFr Bld  Date/Time Value Ref Range Status  11/22/2018 06:10 AM 7.1 (H) 4.8 - 5.6 % Final    Comment:    (NOTE) Pre diabetes:          5.7%-6.4% Diabetes:              >6.4% Glycemic control for   <7.0% adults with diabetes   08/26/2018 09:51 AM 7.4 (H) 4.8 - 5.6 % Final    Comment:             Prediabetes: 5.7 - 6.4          Diabetes: >6.4          Glycemic control for adults with diabetes: <7.0     CBG: Recent Labs  Lab 11/25/18 2316 11/26/18 0308 11/26/18 0758 11/26/18 1251 11/26/18 1516  GLUCAP 223* 263* 326* 469* 326*   The patient is critically ill with multiple organ system failure and requires high complexity decision making for assessment and support, frequent evaluation and titration of therapies, advanced monitoring, review of radiographic studies and interpretation of complex data.   Critical Care Time devoted to patient care services, exclusive of separately billable procedures, described in this note is 45 minutes.   Kipp Brood, MD [facility] ICU Physician [place]  Pager: [phone] Mobile: [phone] After hours: [phone].  11/26/2018, 4:36 PM      11/26/2018, 4:27 PM

## 2018-11-27 DIAGNOSIS — Z201 Contact with and (suspected) exposure to tuberculosis: Secondary | ICD-10-CM

## 2018-11-27 LAB — CBC
HCT: 33.6 % — ABNORMAL LOW (ref 39.0–52.0)
Hemoglobin: 10.9 g/dL — ABNORMAL LOW (ref 13.0–17.0)
MCH: 30.5 pg (ref 26.0–34.0)
MCHC: 32.4 g/dL (ref 30.0–36.0)
MCV: 94.1 fL (ref 80.0–100.0)
Platelets: 125 10*3/uL — ABNORMAL LOW (ref 150–400)
RBC: 3.57 MIL/uL — ABNORMAL LOW (ref 4.22–5.81)
RDW: 14.6 % (ref 11.5–15.5)
WBC: 11.9 10*3/uL — ABNORMAL HIGH (ref 4.0–10.5)
nRBC: 0.2 % (ref 0.0–0.2)

## 2018-11-27 LAB — RENAL FUNCTION PANEL
Albumin: 3 g/dL — ABNORMAL LOW (ref 3.5–5.0)
Anion gap: 14 (ref 5–15)
BUN: 116 mg/dL — ABNORMAL HIGH (ref 6–20)
CO2: 24 mmol/L (ref 22–32)
Calcium: 9.5 mg/dL (ref 8.9–10.3)
Chloride: 97 mmol/L — ABNORMAL LOW (ref 98–111)
Creatinine, Ser: 4.87 mg/dL — ABNORMAL HIGH (ref 0.61–1.24)
GFR calc Af Amer: 15 mL/min — ABNORMAL LOW (ref >60)
GFR calc non Af Amer: 13 mL/min — ABNORMAL LOW (ref >60)
Glucose, Bld: 133 mg/dL — ABNORMAL HIGH (ref 70–99)
Phosphorus: 3.2 mg/dL (ref 2.5–4.6)
Potassium: 4.6 mmol/L (ref 3.5–5.1)
Sodium: 135 mmol/L (ref 135–145)

## 2018-11-27 LAB — CULTURE, RESPIRATORY W GRAM STAIN
Culture: NORMAL
Special Requests: NORMAL

## 2018-11-27 LAB — MAGNESIUM: Magnesium: 3 mg/dL — ABNORMAL HIGH (ref 1.7–2.4)

## 2018-11-27 LAB — GLUCOSE, CAPILLARY
Glucose-Capillary: 121 mg/dL — ABNORMAL HIGH (ref 70–99)
Glucose-Capillary: 132 mg/dL — ABNORMAL HIGH (ref 70–99)
Glucose-Capillary: 149 mg/dL — ABNORMAL HIGH (ref 70–99)
Glucose-Capillary: 191 mg/dL — ABNORMAL HIGH (ref 70–99)
Glucose-Capillary: 211 mg/dL — ABNORMAL HIGH (ref 70–99)
Glucose-Capillary: 262 mg/dL — ABNORMAL HIGH (ref 70–99)

## 2018-11-27 LAB — CULTURE, BLOOD (ROUTINE X 2)
Culture: NO GROWTH
Culture: NO GROWTH
Special Requests: ADEQUATE
Special Requests: ADEQUATE

## 2018-11-27 MED ORDER — ALTEPLASE 2 MG IJ SOLR: 2.0000 mg | Freq: Once | INTRAMUSCULAR | Status: DC | PRN

## 2018-11-27 MED ORDER — HEPARIN SODIUM (PORCINE) 1000 UNIT/ML DIALYSIS
20.0000 [IU]/kg | INTRAMUSCULAR | Status: DC | PRN
  Filled 2018-11-27: qty 2

## 2018-11-27 MED ORDER — LIDOCAINE HCL (PF) 1 % IJ SOLN: 5.0000 mL | INTRAMUSCULAR | Status: DC | PRN

## 2018-11-27 MED ORDER — SODIUM CHLORIDE 0.9 % IV SOLN: 100.0000 mL | INTRAVENOUS | Status: DC | PRN

## 2018-11-27 MED ORDER — HEPARIN SODIUM (PORCINE) 1000 UNIT/ML DIALYSIS
1000.0000 [IU] | INTRAMUSCULAR | Status: DC | PRN
  Filled 2018-11-27: qty 1

## 2018-11-27 MED ORDER — LIDOCAINE-PRILOCAINE 2.5-2.5 % EX CREA
1.0000 "application " | TOPICAL_CREAM | CUTANEOUS | Status: DC | PRN
  Filled 2018-11-27: qty 5

## 2018-11-27 MED ORDER — PENTAFLUOROPROP-TETRAFLUOROETH EX AERO: 1.0000 "application " | INHALATION_SPRAY | CUTANEOUS | Status: DC | PRN

## 2018-11-27 NOTE — Progress Notes (Signed)
Assisted tele visit to patient with wife.  Blake Santana, Janace Hoard, RN

## 2018-11-27 NOTE — Progress Notes (Signed)
Sign out from Dr. Lynetta Mare.  Patient transferred to ICU for COVID related pneumonia requiring intubation on 4/21.  Patient extubated on 4/25.  Currently doing well on 3L per [place] and able to get up to chair.  Patient will transfer to 2W on isolation due to COVID and + Quant gold obtained while in ICU.  Smear pending.  Recommend ID consult following results to decide of post-exposure or active treatment is needed.  FPTS appreciates the excellent care provided by CCM and are happy to take over care for this patient on 4/27 at 0700.  [place] Constable, D.O.  PGY-1 Family Medicine  11/27/2018 8:15 AM

## 2018-11-27 NOTE — Progress Notes (Signed)
NAME:  Blake Santana, MRN:  [id number], DOB:  05/06/1971, LOS: 5 ADMISSION DATE:  11/22/2018, CONSULTATION DATE:  11/22/2018 REFERRING MD:  Ardelia Mems - FMTS , CHIEF COMPLAINT:  Hypoxia   Brief History   48 year old male patient with ESRD with resp failure, increasing O2 requrements. PCCM consulted and pt intubated  Initial COVID testing was negative, but repeat testing was positive  Past Medical History  ESRD on T/R/Sa iHD  CHF HTN HLD Fe Deficient anemia DM Malnutrition  Significant Hospital Events   4/21> Admitted. Intubated.  4/22 > Tocluzimab X 1, plaquinel held due to prolonged qtc. Started on steroids for 3 days  Consults:  PCCM  Procedures:  OETT 4/21 > Rt radial a line 4/21 > Rt IJ CVL 4/21 >  Significant Diagnostic Tests:  CXR 4/21> bilateral airspace opacities. Cardiomegaly.   Micro Data:  4/21> SARS CoV 2- negative 4/21> REPEAT SARS CoV2>>> Postive 4/21> BCx>>>  Sp Cx 4/21 >>  Antimicrobials:  4/21 Vanc>> 4/21 Cefepime >>  Interim history/subjective:  Off CRRT. Awake and alert.  Succc  Objective [id number]  Blood pressure 138/76, pulse 74, temperature 98.5 F (36.9 C), temperature source Oral, resp. rate 16, height 5\' 2"  (1.575 m), weight 60.7 kg, SpO2 97 %.    Vent Mode: PSV;CPAP FiO2 (%):  [40 %] 40 % PEEP:  [5 cmH20] 5 cmH20 Pressure Support:  [5 cmH20] 5 cmH20   Intake/Output Summary (Last 24 hours) at 11/27/2018 0748 Last data filed at 11/27/2018 0200 Gross per 24 hour  Intake 2101.77 ml  Output -  Net 2101.77 ml   Filed Weights   11/24/18 0444 11/25/18 0315 11/26/18 0200  Weight: 63 kg 64.3 kg 60.7 kg    Examination: Gen:      No acute distress.  Thin. HEENT:  EOMI, sclera anicteric Neck:     No masses; no thyromegaly Lungs:   Crackles at both bases.  No respiratory distress. CV:         Regular rate and rhythm; no murmurs.  Normal capillary refill. Abd:      + bowel sounds; soft, non-tender; no palpable masses, no  distension Ext:    No edema; adequate peripheral perfusion Skin:      Warm and dry; no rash Neuro: alert and oriented x 3 Psych: normal mood and affect  Assessment & Plan:  48 year old with acute hypoxic respiratory failure, ARDS secondary to COVID 19  Critically ill due to acute hypoxic respiratory failure due to ARDS from COVID-19.   Improved and successfully extubated. Antibiotics have been stopped.   Empiric antibiotics have been discontinued given negative cultures. Patient has passed an SBT and has been successfully extubated. Requires ongoing observation as remains at risk for respiratory decompensation.  ESRD on HD (TTS) Transition to intermittent dialysis  Hypertension Resume home antihypertensive regimen.  DM Control is suboptimal. Hold on further insulin increase now that extubated and tube feeds are off. Adjust according to oral intake.  Goals of care Discussed with wife via interpreter 4/23. She wishes him to remain full code.   Best practice:  Diet: NPO and fluid restriction for now Pain/Anxiety/Delirium protocol (if indicated): will assess need.  Off all continuous infusions  VAP protocol (if indicated): Discontinued DVT prophylaxis: Unfractionated heparin.   GI prophylaxis: Not indicated. Glucose control: Lantus, SSI  Mobility: bedrest Code Status: Full code Family Communication: Patient updated. Disposition: ICU  Labs   CBC: Recent Labs  Lab 11/22/18 0244  11/23/18 0530  11/24/18 6256  11/25/18 0334 11/26/18 0310 11/26/18 0313 11/27/18 0319  WBC 11.4*   < > 8.2  --  7.2 7.0  --  9.0 11.9*  NEUTROABS 8.2*  --  6.5  --   --   --   --   --   --   HGB 11.0*   < > 8.9*   < > 7.9* 8.5* 10.9* 10.5* 10.9*  HCT 35.6*   < > 27.2*   < > 25.3* 27.3* 32.0* 32.8* 33.6*  MCV 94.9   < > 96.5  --  96.6 96.5  --  94.3 94.1  PLT 163   < > 117*  --  114* 128*  --  114* 125*   < > = values in this interval not displayed.    Basic Metabolic Panel: Recent Labs   Lab 11/23/18 2155  11/24/18 0432  11/25/18 0334 11/25/18 1600 11/26/18 0310 11/26/18 0313 11/26/18 1523 11/27/18 0319  NA  --    < > 134*   < > 135 136 137 135 135 135  K  --    < > 4.6   < > 4.1 3.9 3.1* 3.0* 4.3 4.6  CL  --   --  100   < > 100 99  --  98 97* 97*  CO2  --   --  24   < > 24 26  --  24 23 24   GLUCOSE  --   --  213*   < > 254* 291*  --  284* 324* 133*  BUN  --   --  42*   < > 43* 44*  --  48* 94* 116*  CREATININE  --   --  3.96*   < > 2.49* 2.12*  --  1.91* 3.24* 4.87*  CALCIUM  --   --  8.5*   < > 8.7* 9.0  --  9.0 9.2 9.5  MG 2.6*  --  2.5*  --  2.9*  --   --  2.8*  --  3.0*  PHOS  --   --  3.7   < > 2.7 2.6  --  2.2* 3.3 3.2   < > = values in this interval not displayed.   GFR: Estimated Creatinine Clearance: 14 mL/min (A) (by C-G formula based on SCr of 4.87 mg/dL (H)). Recent Labs  Lab 11/22/18 0244  11/22/18 0730 11/22/18 1923 11/23/18 0530 11/24/18 0432 11/25/18 0334 11/26/18 0313 11/27/18 0319  PROCALCITON  --   --   --  2.15 8.64 29.94  --   --   --   WBC 11.4*   < >  --   --  8.2 7.2 7.0 9.0 11.9*  LATICACIDVEN 1.0  --  0.9  --   --   --   --   --   --    < > = values in this interval not displayed.    Liver Function Tests: Recent Labs  Lab 11/22/18 0244 11/23/18 0530  11/24/18 0432  11/25/18 0334 11/25/18 1600 11/26/18 0313 11/26/18 1523 11/27/18 0319  AST 15 14*  --  16  --   --   --   --   --   --   ALT 11 10  --  12  --   --   --   --   --   --   ALKPHOS 87 54  --  61  --   --   --   --   --   --  BILITOT 0.6 0.8  --  0.8  --   --   --   --   --   --   PROT 7.7 6.1*  --  6.6  --   --   --   --   --   --   ALBUMIN 3.8 2.7*  2.7*   < > 2.6*   < > 2.7* 3.0* 3.0* 3.1* 3.0*   < > = values in this interval not displayed.   No results for input(s): LIPASE, AMYLASE in the last 168 hours. No results for input(s): AMMONIA in the last 168 hours.  ABG    Component Value Date/Time   PHART 7.383 11/26/2018 0310   PCO2ART 45.1  11/26/2018 0310   PO2ART 87.0 11/26/2018 0310   HCO3 26.9 11/26/2018 0310   TCO2 28 11/26/2018 0310   O2SAT 97.0 11/26/2018 0310     Coagulation Profile: No results for input(s): INR, PROTIME in the last 168 hours.  Cardiac Enzymes: Recent Labs  Lab 11/22/18 0607 11/22/18 1149 11/22/18 1923 11/23/18 0530 11/23/18 1711  TROPONINI 0.04* 0.08* 0.11* 0.25* 0.15*    HbA1C: Hgb A1c MFr Bld  Date/Time Value Ref Range Status  11/22/2018 06:10 AM 7.1 (H) 4.8 - 5.6 % Final    Comment:    (NOTE) Pre diabetes:          5.7%-6.4% Diabetes:              >6.4% Glycemic control for   <7.0% adults with diabetes   08/26/2018 09:51 AM 7.4 (H) 4.8 - 5.6 % Final    Comment:             Prediabetes: 5.7 - 6.4          Diabetes: >6.4          Glycemic control for adults with diabetes: <7.0     CBG: Recent Labs  Lab 11/26/18 1251 11/26/18 1516 11/26/18 2028 11/27/18 0018 11/27/18 0323  GLUCAP 469* 326* 91 191* 132*   The patient is critically ill with multiple organ system failure and requires high complexity decision making for assessment and support, frequent evaluation and titration of therapies, advanced monitoring, review of radiographic studies and interpretation of complex data.   Critical Care Time devoted to patient care services, exclusive of separately billable procedures, described in this note is 45 minutes.   Kipp Brood, MD [facility] ICU Physician [place]  Pager: [phone] Mobile: [phone] After hours: [phone].  11/27/2018, 7:48 AM      11/27/2018, 7:48 AM

## 2018-11-27 NOTE — Progress Notes (Signed)
NAME:  Blake Santana, MRN:  [id number], DOB:  01-12-1971, LOS: 5 ADMISSION DATE:  11/22/2018, CONSULTATION DATE:  11/22/2018 REFERRING MD:  Ardelia Mems - FMTS , CHIEF COMPLAINT:  Hypoxia   Brief History   48 year old male patient with ESRD with resp failure, increasing O2 requrements. PCCM consulted and pt intubated  Initial COVID testing was negative, but repeat testing was positive.  Past Medical History  ESRD on T/R/Sa iHD  CHF HTN HLD Fe Deficient anemia DM Malnutrition  Significant Hospital Events   4/21> Admitted. Intubated.  4/22 > Tocluzimab X 1, plaquinel held due to prolonged qtc. Started on steroids for 3 days  Consults:  PCCM  Procedures:  OETT 4/21 > Rt radial a line 4/21 > Rt IJ CVL 4/21 >  Significant Diagnostic Tests:  CXR 4/21> bilateral airspace opacities. Cardiomegaly.   Micro Data:  4/21> SARS CoV 2- negative 4/21> REPEAT SARS CoV2>>> Postive 4/21> BCx>>> negative Sp Cx 4/21 >> AFB culture pending. Quantiferon Gold positive (indicates passed exposure to TB)  Antimicrobials:  4/21 Vanc>>4/23 4/21 Cefepime >>4/24  Interim history/subjective:  Off CRRT. Awake and alert.  Successfully extubated yesterday  Objective   Blood pressure 138/76, pulse 74, temperature 98.5 F (36.9 C), temperature source Oral, resp. rate 16, height 5\' 2"  (1.575 m), weight 60.7 kg, SpO2 97 %.    SpO2: 97 % O2 Flow Rate (L/min): 3 L/min FiO2 (%): 40 %    Intake/Output Summary (Last 24 hours) at 11/27/2018 0750 Last data filed at 11/27/2018 0200 Gross per 24 hour  Intake 2101.77 ml  Output -  Net 2101.77 ml   Filed Weights   11/24/18 0444 11/25/18 0315 11/26/18 0200  Weight: 63 kg 64.3 kg 60.7 kg    Examination: Gen:      No acute distress.  Thin. HEENT:  EOMI, sclera anicteric Neck:     No masses; no thyromegaly Lungs:   Crackles at both bases.  No respiratory distress. CV:         Regular rate and rhythm; no murmurs.  Normal capillary refill. Abd:       + bowel sounds; soft, non-tender; no palpable masses, no distension Ext:    No edema; adequate peripheral perfusion Skin:      Warm and dry; no rash Neuro: alert and oriented x 3, generalized weakness as having difficult time holding himself up in bed.  Psych: normal mood and affect  Assessment & Plan:  48 year old with acute hypoxic respiratory failure, ARDS secondary to COVID 19  Was critically ill due to acute hypoxic respiratory failure due to ARDS from COVID-19.   Improved and successfully extubated. Off all empiric antibiotics as cultures negative. Incentive spirometry  Progressive ambulation.  ESRD on HD (TTS) Transition to intermittent dialysis  Hypertension Resumed home antihypertensive regimen.  DM Control has improved since extubation.  Positive Quantiferon Gold AFB in ETT aspirate pending.  If smear is negative - then prior exposure only.   Best practice:  Diet: advance diet to full renal  Pain/Anxiety/Delirium protocol (if indicated): none required.  VAP protocol (if indicated): Discontinued DVT prophylaxis: Unfractionated heparin.   GI prophylaxis: Not indicated. Glucose control: Lantus, SSI  Mobility: bedrest Code Status: Full code Family Communication: Patient updated. Disposition: transfer to 2W airborne isolation. FMTS contacted and order reconciliation complete.  Labs   CBC: Recent Labs  Lab 11/22/18 0244  11/23/18 0530  11/24/18 0432 11/25/18 0334 11/26/18 0310 11/26/18 0313 11/27/18 0319  WBC 11.4*   < >  8.2  --  7.2 7.0  --  9.0 11.9*  NEUTROABS 8.2*  --  6.5  --   --   --   --   --   --   HGB 11.0*   < > 8.9*   < > 7.9* 8.5* 10.9* 10.5* 10.9*  HCT 35.6*   < > 27.2*   < > 25.3* 27.3* 32.0* 32.8* 33.6*  MCV 94.9   < > 96.5  --  96.6 96.5  --  94.3 94.1  PLT 163   < > 117*  --  114* 128*  --  114* 125*   < > = values in this interval not displayed.    Basic Metabolic Panel: Recent Labs  Lab 11/23/18 2155  11/24/18 0432  11/25/18  0334 11/25/18 1600 11/26/18 0310 11/26/18 0313 11/26/18 1523 11/27/18 0319  NA  --    < > 134*   < > 135 136 137 135 135 135  K  --    < > 4.6   < > 4.1 3.9 3.1* 3.0* 4.3 4.6  CL  --   --  100   < > 100 99  --  98 97* 97*  CO2  --   --  24   < > 24 26  --  24 23 24   GLUCOSE  --   --  213*   < > 254* 291*  --  284* 324* 133*  BUN  --   --  42*   < > 43* 44*  --  48* 94* 116*  CREATININE  --   --  3.96*   < > 2.49* 2.12*  --  1.91* 3.24* 4.87*  CALCIUM  --   --  8.5*   < > 8.7* 9.0  --  9.0 9.2 9.5  MG 2.6*  --  2.5*  --  2.9*  --   --  2.8*  --  3.0*  PHOS  --   --  3.7   < > 2.7 2.6  --  2.2* 3.3 3.2   < > = values in this interval not displayed.   GFR: Estimated Creatinine Clearance: 14 mL/min (A) (by C-G formula based on SCr of 4.87 mg/dL (H)). Recent Labs  Lab 11/22/18 0244  11/22/18 0730 11/22/18 1923 11/23/18 0530 11/24/18 0432 11/25/18 0334 11/26/18 0313 11/27/18 0319  PROCALCITON  --   --   --  2.15 8.64 29.94  --   --   --   WBC 11.4*   < >  --   --  8.2 7.2 7.0 9.0 11.9*  LATICACIDVEN 1.0  --  0.9  --   --   --   --   --   --    < > = values in this interval not displayed.    Liver Function Tests: Recent Labs  Lab 11/22/18 0244 11/23/18 0530  11/24/18 0432  11/25/18 0334 11/25/18 1600 11/26/18 0313 11/26/18 1523 11/27/18 0319  AST 15 14*  --  16  --   --   --   --   --   --   ALT 11 10  --  12  --   --   --   --   --   --   ALKPHOS 87 54  --  61  --   --   --   --   --   --   BILITOT 0.6 0.8  --  0.8  --   --   --   --   --   --  PROT 7.7 6.1*  --  6.6  --   --   --   --   --   --   ALBUMIN 3.8 2.7*  2.7*   < > 2.6*   < > 2.7* 3.0* 3.0* 3.1* 3.0*   < > = values in this interval not displayed.   No results for input(s): LIPASE, AMYLASE in the last 168 hours. No results for input(s): AMMONIA in the last 168 hours.  ABG    Component Value Date/Time   PHART 7.383 11/26/2018 0310   PCO2ART 45.1 11/26/2018 0310   PO2ART 87.0 11/26/2018 0310   HCO3  26.9 11/26/2018 0310   TCO2 28 11/26/2018 0310   O2SAT 97.0 11/26/2018 0310     Coagulation Profile: No results for input(s): INR, PROTIME in the last 168 hours.  Cardiac Enzymes: Recent Labs  Lab 11/22/18 0607 11/22/18 1149 11/22/18 1923 11/23/18 0530 11/23/18 1711  TROPONINI 0.04* 0.08* 0.11* 0.25* 0.15*    HbA1C: Hgb A1c MFr Bld  Date/Time Value Ref Range Status  11/22/2018 06:10 AM 7.1 (H) 4.8 - 5.6 % Final    Comment:    (NOTE) Pre diabetes:          5.7%-6.4% Diabetes:              >6.4% Glycemic control for   <7.0% adults with diabetes   08/26/2018 09:51 AM 7.4 (H) 4.8 - 5.6 % Final    Comment:             Prediabetes: 5.7 - 6.4          Diabetes: >6.4          Glycemic control for adults with diabetes: <7.0     CBG: Recent Labs  Lab 11/26/18 1251 11/26/18 1516 11/26/18 2028 11/27/18 0018 11/27/18 0323  GLUCAP 469* 326* 91 191* 132*   7min spent including >50% in counseling and coordination of care.  Kipp Brood, MD [facility] ICU Physician [place]  Pager: [phone] Mobile: [phone] After hours: [phone].  11/27/2018, 7:50 AM

## 2018-11-27 NOTE — Progress Notes (Signed)
Patient ID: Kile Kabler, male   DOB: [birth year], 48 y.o.   MRN: [id number] S:Extubated yesterday and doing well. O:BP (!) 141/89   Pulse 86   Temp 98.4 F (36.9 C) (Oral)   Resp 18   Ht 5\' 2"  (1.575 m)   Wt 60.7 kg   SpO2 99%   BMI 24.48 kg/m   Intake/Output Summary (Last 24 hours) at 11/27/2018 1035 Last data filed at 11/27/2018 0902 Gross per 24 hour  Intake 1752.67 ml  Output -  Net 1752.67 ml   Intake/Output: I/O last 3 completed shifts: In: 3259.2 [P.O.:1320; I.V.:569.3; Other:50; NG/GT:710; IV Piggyback:609.9] Out: 2366 [Other:2366]  Intake/Output this shift:  Total I/O In: 120 [P.O.:120] Out: -  Weight change:  Gen: NAD sitting in chair with nasal canula in place Direct physical contact was not performed due to covid-19 infection.  Case was discussed with members of the PCCM staff.  Recent Labs  Lab 11/22/18 0244  11/23/18 0530  11/24/18 0432 11/24/18 1700 11/25/18 0334 11/25/18 1600 11/26/18 0310 11/26/18 0313 11/26/18 1523 11/27/18 0319  NA 140   < > 137   < > 134* 134* 135 136 137 135 135 135  K 5.4*   < > 5.0   < > 4.6 3.9 4.1 3.9 3.1* 3.0* 4.3 4.6  CL 97*  --  99   < > 100 97* 100 99  --  98 97* 97*  CO2 29  --  26   < > 24 25 24 26   --  24 23 24   GLUCOSE 135*  --  118*   < > 213* 291* 254* 291*  --  284* 324* 133*  BUN 63*  --  63*   < > 42* 41* 43* 44*  --  48* 94* 116*  CREATININE 10.49*   < > 9.18*   < > 3.96* 3.00* 2.49* 2.12*  --  1.91* 3.24* 4.87*  ALBUMIN 3.8  --  2.7*  2.7*   < > 2.6* 2.8* 2.7* 3.0*  --  3.0* 3.1* 3.0*  CALCIUM 10.0  --  8.3*   < > 8.5* 8.6* 8.7* 9.0  --  9.0 9.2 9.5  PHOS  --   --  2.7   < > 3.7 3.1 2.7 2.6  --  2.2* 3.3 3.2  AST 15  --  14*  --  16  --   --   --   --   --   --   --   ALT 11  --  10  --  12  --   --   --   --   --   --   --    < > = values in this interval not displayed.   Liver Function Tests: Recent Labs  Lab 11/22/18 0244 11/23/18 0530  11/24/18 0432  11/26/18 0313 11/26/18 1523  11/27/18 0319  AST 15 14*  --  16  --   --   --   --   ALT 11 10  --  12  --   --   --   --   ALKPHOS 87 54  --  61  --   --   --   --   BILITOT 0.6 0.8  --  0.8  --   --   --   --   PROT 7.7 6.1*  --  6.6  --   --   --   --   ALBUMIN 3.8 2.7*  2.7*   < > 2.6*   < > 3.0* 3.1* 3.0*   < > = values in this interval not displayed.   No results for input(s): LIPASE, AMYLASE in the last 168 hours. No results for input(s): AMMONIA in the last 168 hours. CBC: Recent Labs  Lab 11/22/18 0244  11/23/18 0530  11/24/18 0432 11/25/18 0334 11/26/18 0310 11/26/18 0313 11/27/18 0319  WBC 11.4*   < > 8.2  --  7.2 7.0  --  9.0 11.9*  NEUTROABS 8.2*  --  6.5  --   --   --   --   --   --   HGB 11.0*   < > 8.9*   < > 7.9* 8.5* 10.9* 10.5* 10.9*  HCT 35.6*   < > 27.2*   < > 25.3* 27.3* 32.0* 32.8* 33.6*  MCV 94.9   < > 96.5  --  96.6 96.5  --  94.3 94.1  PLT 163   < > 117*  --  114* 128*  --  114* 125*   < > = values in this interval not displayed.   Cardiac Enzymes: Recent Labs  Lab 11/22/18 0607 11/22/18 1149 11/22/18 1923 11/23/18 0530 11/23/18 1711  TROPONINI 0.04* 0.08* 0.11* 0.25* 0.15*   CBG: Recent Labs  Lab 11/26/18 1516 11/26/18 2028 11/27/18 0018 11/27/18 0323 11/27/18 0749  GLUCAP 326* 91 191* 132* 149*    Iron Studies: No results for input(s): IRON, TIBC, TRANSFERRIN, FERRITIN in the last 72 hours. Studies/Results: Dg Chest Port 1 View  Result Date: 11/26/2018 CLINICAL DATA:  Respiratory failure EXAM: PORTABLE CHEST 1 VIEW COMPARISON:  November 23, 2018 FINDINGS: The ETT terminates 2.3 cm above the carina in good position. The NG tube terminates in the stomach. The right central line is stable. No pneumothorax. Mild opacity in left base is less prominent the interval the retrocardiac opacity prior persists. Right-sided infiltrate has significantly improved. However, infiltrate remains in the right base. IMPRESSION: 1. Support apparatus as above. 2. Improving infiltrate on  the right. However, infiltrate remains in the right base. 3. Suspected left retrocardiac opacity also appears less prominent in the interval. Electronically Signed   By: Dorise Bullion III M.D   On: 11/26/2018 05:49   . amLODipine  5 mg Oral Daily  . aspirin EC  81 mg Oral Daily  . Chlorhexidine Gluconate Cloth  6 each Topical Daily  . enoxaparin (LOVENOX) injection  30 mg Subcutaneous Q24H  . gabapentin  300 mg Oral Q T,Th,Sa-HD  . insulin aspart  0-15 Units Subcutaneous Q4H  . insulin glargine  10 Units Subcutaneous Daily  . mouth rinse  15 mL Mouth Rinse BID  . multivitamin  1 tablet Oral QHS  . rosuvastatin  20 mg Oral Q24H  . senna-docusate  1 tablet Oral BID  . sodium chloride flush  10-40 mL Intracatheter Q12H  . sucroferric oxyhydroxide  1,000 mg Oral TID WC    BMET    Component Value Date/Time   NA 135 11/27/2018 0319   NA 144 08/26/2018 0951   K 4.6 11/27/2018 0319   CL 97 (L) 11/27/2018 0319   CO2 24 11/27/2018 0319   GLUCOSE 133 (H) 11/27/2018 0319   BUN 116 (H) 11/27/2018 0319   BUN 28 (H) 08/26/2018 0951   CREATININE 4.87 (H) 11/27/2018 0319   CALCIUM 9.5 11/27/2018 0319   GFRNONAA 13 (L) 11/27/2018 0319   GFRAA 15 (L) 11/27/2018 0319   CBC    Component  Value Date/Time   WBC 11.9 (H) 11/27/2018 0319   RBC 3.57 (L) 11/27/2018 0319   HGB 10.9 (L) 11/27/2018 0319   HGB 8.4 (L) 01/14/2017 1046   HCT 33.6 (L) 11/27/2018 0319   HCT 26.0 (L) 01/14/2017 1046   PLT 125 (L) 11/27/2018 0319   PLT 236 01/14/2017 1046   MCV 94.1 11/27/2018 0319   MCV 88 01/14/2017 1046   MCH 30.5 11/27/2018 0319   MCHC 32.4 11/27/2018 0319   RDW 14.6 11/27/2018 0319   RDW 14.1 01/14/2017 1046   LYMPHSABS 1.3 11/23/2018 0530   LYMPHSABS 1.8 01/14/2017 1046   MONOABS 0.3 11/23/2018 0530   EOSABS 0.1 11/23/2018 0530   EOSABS 0.7 (H) 01/14/2017 1046   BASOSABS 0.0 11/23/2018 0530   BASOSABS 0.0 01/14/2017 1046    Dialysis Orders: [facility]  TTS. 180NRe Optiflux; Time: 3:45 hr; BFR 400; DFR 800; EDW 64.5kg 2K/2 Ca Heparin 2000 unit bolus  Hectorol 2 mcg IV 3x week during HD Venofer 50 mg IV q week Velphoro 500 mg 2 tabs PO TID with meals  Assessment/ Plan:  1. Covid-19 infection with sepsis- was extubated and doing well on 3 liters [place].  To be transferred out of the ICU per PCCM. Off plaquenil due to prolonged QTc 2. ESRD- started CVVHD on 11/22/18 due to volume overload and respiratory failure due to COVID-19. Discontinued 11/26/18 due to improved BP and volume status.  1. Will transition to IHD tomorrow and then get back on schedule Thursday or Saturday  2. He is 3.5 kg below his edw 3. Anemia:Hgb up to 10.5 4. Hyperkalemia- improved with CVVHD 5. CKD-MBD:stable 6. Nutrition:npo for now 7. Hypertension: back on home meds 8. DM- per primary 9. Quantiferon-TB Gold positive- awaiting AFB smear negative and cultures pending.  Likely prior exposure without active infection.  Awaiting cultures.  Donetta Potts, MD [employer] [phone]

## 2018-11-28 DIAGNOSIS — Z201 Contact with and (suspected) exposure to tuberculosis: Secondary | ICD-10-CM

## 2018-11-28 DIAGNOSIS — J1289 Other viral pneumonia: Secondary | ICD-10-CM

## 2018-11-28 LAB — CBC
HCT: 31.4 % — ABNORMAL LOW (ref 39.0–52.0)
HCT: 31.5 % — ABNORMAL LOW (ref 39.0–52.0)
Hemoglobin: 10.6 g/dL — ABNORMAL LOW (ref 13.0–17.0)
Hemoglobin: 10.8 g/dL — ABNORMAL LOW (ref 13.0–17.0)
MCH: 31.1 pg (ref 26.0–34.0)
MCH: 30.6 pg (ref 26.0–34.0)
MCHC: 33.8 g/dL (ref 30.0–36.0)
MCHC: 34.3 g/dL (ref 30.0–36.0)
MCV: 92.1 fL (ref 80.0–100.0)
MCV: 89.2 fL (ref 80.0–100.0)
Platelets: 136 10*3/uL — ABNORMAL LOW (ref 150–400)
Platelets: 128 10*3/uL — ABNORMAL LOW (ref 150–400)
RBC: 3.41 MIL/uL — ABNORMAL LOW (ref 4.22–5.81)
RBC: 3.53 MIL/uL — ABNORMAL LOW (ref 4.22–5.81)
RDW: 14.4 % (ref 11.5–15.5)
RDW: 14.3 % (ref 11.5–15.5)
WBC: 11.5 10*3/uL — ABNORMAL HIGH (ref 4.0–10.5)
WBC: 9.2 10*3/uL (ref 4.0–10.5)
nRBC: 0 % (ref 0.0–0.2)
nRBC: 0 % (ref 0.0–0.2)

## 2018-11-28 LAB — GLUCOSE, CAPILLARY
Glucose-Capillary: 179 mg/dL — ABNORMAL HIGH (ref 70–99)
Glucose-Capillary: 153 mg/dL — ABNORMAL HIGH (ref 70–99)
Glucose-Capillary: 150 mg/dL — ABNORMAL HIGH (ref 70–99)
Glucose-Capillary: 238 mg/dL — ABNORMAL HIGH (ref 70–99)
Glucose-Capillary: 275 mg/dL — ABNORMAL HIGH (ref 70–99)

## 2018-11-28 LAB — RENAL FUNCTION PANEL
Albumin: 2.9 g/dL — ABNORMAL LOW (ref 3.5–5.0)
Albumin: 3 g/dL — ABNORMAL LOW (ref 3.5–5.0)
Anion gap: 17 — ABNORMAL HIGH (ref 5–15)
Anion gap: 14 (ref 5–15)
BUN: 157 mg/dL — ABNORMAL HIGH (ref 6–20)
BUN: 66 mg/dL — ABNORMAL HIGH (ref 6–20)
CO2: 21 mmol/L — ABNORMAL LOW (ref 22–32)
CO2: 23 mmol/L (ref 22–32)
Calcium: 9.5 mg/dL (ref 8.9–10.3)
Calcium: 8.4 mg/dL — ABNORMAL LOW (ref 8.9–10.3)
Chloride: 93 mmol/L — ABNORMAL LOW (ref 98–111)
Chloride: 95 mmol/L — ABNORMAL LOW (ref 98–111)
Creatinine, Ser: 8.83 mg/dL — ABNORMAL HIGH (ref 0.61–1.24)
Creatinine, Ser: 3.97 mg/dL — ABNORMAL HIGH (ref 0.61–1.24)
GFR calc Af Amer: 7 mL/min — ABNORMAL LOW (ref >60)
GFR calc Af Amer: 19 mL/min — ABNORMAL LOW (ref >60)
GFR calc non Af Amer: 6 mL/min — ABNORMAL LOW (ref >60)
GFR calc non Af Amer: 16 mL/min — ABNORMAL LOW (ref >60)
Glucose, Bld: 159 mg/dL — ABNORMAL HIGH (ref 70–99)
Glucose, Bld: 178 mg/dL — ABNORMAL HIGH (ref 70–99)
Phosphorus: 3.7 mg/dL (ref 2.5–4.6)
Phosphorus: 1.6 mg/dL — ABNORMAL LOW (ref 2.5–4.6)
Potassium: 4.5 mmol/L (ref 3.5–5.1)
Potassium: 3.1 mmol/L — ABNORMAL LOW (ref 3.5–5.1)
Sodium: 131 mmol/L — ABNORMAL LOW (ref 135–145)
Sodium: 132 mmol/L — ABNORMAL LOW (ref 135–145)

## 2018-11-28 LAB — TROPONIN I: Troponin I: 0.07 ng/mL (ref <0.03)

## 2018-11-28 MED ORDER — NEPRO/CARBSTEADY PO LIQD
237.0000 mL | Freq: Two times a day (BID) | ORAL | Status: DC
  Administered 2018-11-28 – 2018-12-01 (×7): 237 mL via ORAL
  Filled 2018-11-28 (×8): qty 237

## 2018-11-28 MED ORDER — INSULIN ASPART 100 UNIT/ML ~~LOC~~ SOLN
0.0000 [IU] | Freq: Three times a day (TID) | SUBCUTANEOUS | Status: DC
  Administered 2018-11-28: 1 [IU] via SUBCUTANEOUS
  Administered 2018-11-28: 3 [IU] via SUBCUTANEOUS
  Administered 2018-11-29: 2 [IU] via SUBCUTANEOUS
  Administered 2018-11-29 (×2): 3 [IU] via SUBCUTANEOUS
  Administered 2018-11-30: 2 [IU] via SUBCUTANEOUS
  Administered 2018-11-30 (×2): 3 [IU] via SUBCUTANEOUS
  Administered 2018-12-01 (×2): 2 [IU] via SUBCUTANEOUS

## 2018-11-28 MED ORDER — AMLODIPINE BESYLATE 10 MG PO TABS
10.0000 mg | ORAL_TABLET | Freq: Every day | ORAL | Status: DC
  Administered 2018-11-29 – 2018-12-01 (×3): 10 mg via ORAL
  Filled 2018-11-28 (×5): qty 1

## 2018-11-28 MED ORDER — HEPARIN SODIUM (PORCINE) 5000 UNIT/ML IJ SOLN
5000.0000 [IU] | Freq: Three times a day (TID) | INTRAMUSCULAR | Status: DC
  Administered 2018-11-28 – 2018-12-01 (×8): 5000 [IU] via SUBCUTANEOUS
  Filled 2018-11-28 (×8): qty 1

## 2018-11-28 NOTE — Progress Notes (Signed)
Called patient's outpatient dialysis center: [facility], [address] [place], [place], [phone]. Spoke to [employer] Orthoptist). Informed her that patient is SARS-CoV2+ and possibly TB positive (Quaninterferon gold positive, Sputum culture pending for active infection). Regarding COVID19, patient would need to go to [place] location, [phone], until he has two negative COVID testing. Angie said she would have to look up the policy regarding possible TB infection. Will enlist social work to facilitate outpatient dialysis once stable for discharge.

## 2018-11-28 NOTE — Progress Notes (Signed)
Received call back from Angie regarding patient's outpatient dialysis status.  If patient was known to have active TB infection, patient would need negative pressure room and his outpatient dialysis facilities cannot accommodate this.  If patient had latent TB infection, it would be up to the medical director if patient could receive outpatient dialysis.  Angie did inform me the patient had negative PPD 1.5 years ago.  Given the complexity of the situation and unknown status of patient's tuberculosis infection (active versus latent), both with Dr. Evelina Bucy with infectious disease.  She said that would likely need to induce sputum x2 more times and need cultures.  This is due to patient's immigrant status and low socioeconomic status.  Primary team asked for formal consult for infectious disease given the multiple steps of care that would be needed to rule out active TB infection.  Dr. Graylon Good said she would be glad to consult on this patient.  Greatly appreciate infectious disease help in management of this patient.  Angie's contact for dialysis center is [phone].

## 2018-11-28 NOTE — Progress Notes (Addendum)
Family Medicine Teaching Service Daily Progress Note Intern Pager: [phone]  Patient name: Blake Santana Medical record number: [id number] Date of birth: [birth year] Age: 48 y.o. Gender: male  Primary Care Provider: Mack Hook, MD Consultants: CCM, ID Code Status:   Code Status: Full Code   Pt Overview and Major Events to Date:  Blake Santana is a 48 y.o. male presented with 4 days of SOB and chest pain. PMH is significant for  has a past medical history of AOCD, HFpEF, ESRD on HD, DMT2 HLD, HTN, CAD  4/21 - Admission. Worsening respiratory status. Pulmonary/CCM consulted. Patient intubated. SARS-CoV2 Positive (1 of 2). OETT. R radial arterial line. R IJ CVL placed. Quantiferon Gold +.  4/25 - Extubated.  4/27 - Stable on 3L. BP stable without pressors. Tolerating PO. Transferred back to FPTS 1  Antibiotic Course Vancomycin (4/21-4/23) Cefepime (4/21-4/24)  Assessment and Plan:  #Acute hypoxic respiratory failure #ARDS #COVID-19 Status post intubation and extubation. S/p Tocluzimab X 1, Vancomycin and Cefepime. Stable on 2 L.  No O2 requirement at home.  Continuous pulse ox  Oxygen therapy as needed  WOAT  PT/OT  #Positive QuantiFERON gold.   Sputum AFB pending.   Health dept at D/C  5/25 needs 3 expectorated sputums, if positive, consult ID  F/u w/ outpatient dialysis for receiving HD in setting of QuantiFeron Gold and COVID+.   #ESRD on HD.  Outpatient schedule TTS. Did recieve  Consult nephrology, appreciate recommendations  Daily weights  Strict I's and O's  #DMT2 with neuropathy Hemoglobin A1c 7.1 on admission.  Patient take glipizide at home.  Cont to hold glipizide  SSI + TID CBG checks with meals  #HLD  Continue home rosuvastatin  #HFpEF EF 55 to 55% on 08/2018  Hold home metoprolol  Daily weights  Strict I's and O's  #HTN Patient hypertensive on BP 180/90.  Home medications include amlodipine and  metoprolol.  Consult nephrology, appreciate recommendations  Increase amlodipine to 10 mg daily  Restart home metoprolol if BP/HR stable  #AOCD #Iron deficiency anemia  Hemoglobin stable at 11.5. BL ~11.  Monitor hemoglobin  FEN/GI: Renal/Carb modified PPx: Heparin per pharmacy (to start this PM)  Disposition: progressive pending improved respiratory status  Subjective:  Evaluated by attending.   Objective: Temp:  [97.6 F (36.4 C)-99.3 F (37.4 C)] 97.9 F (36.6 C) (04/27 0457) Pulse Rate:  [85-89] 86 (04/27 0457) Resp:  [18-20] 20 (04/26 2024) BP: (141-189)/(82-94) 181/93 (04/27 0501) SpO2:  [95 %-99 %] 95 % (04/27 0457) Physical Exam: Evaluated by attending.   Laboratory: Recent Labs  Lab 11/26/18 0313 11/27/18 0319 11/28/18 0641  WBC 9.0 11.9* 11.5*  HGB 10.5* 10.9* 10.6*  HCT 32.8* 33.6* 31.4*  PLT 114* 125* 136*   Recent Labs  Lab 11/22/18 0244  11/23/18 0530  11/24/18 0432  11/26/18 0313 11/26/18 1523 11/27/18 0319  NA 140   < > 137   < > 134*   < > 135 135 135  K 5.4*   < > 5.0   < > 4.6   < > 3.0* 4.3 4.6  CL 97*  --  99   < > 100   < > 98 97* 97*  CO2 29  --  26   < > 24   < > 24 23 24   BUN 63*  --  63*   < > 42*   < > 48* 94* 116*  CREATININE 10.49*   < > 9.18*   < >  3.96*   < > 1.91* 3.24* 4.87*  CALCIUM 10.0  --  8.3*   < > 8.5*   < > 9.0 9.2 9.5  PROT 7.7  --  6.1*  --  6.6  --   --   --   --   BILITOT 0.6  --  0.8  --  0.8  --   --   --   --   ALKPHOS 87  --  54  --  61  --   --   --   --   ALT 11  --  10  --  12  --   --   --   --   AST 15  --  14*  --  16  --   --   --   --   GLUCOSE 135*  --  118*   < > 213*   < > 284* 324* 133*   < > = values in this interval not displayed.      Imaging/Diagnostic Tests: Dg Abd 1 View  Result Date: 11/23/2018 CLINICAL DATA:  OG tube placement. EXAM: ABDOMEN - 1 VIEW COMPARISON:  None. FINDINGS: Enteric tube in the stomach. The bowel gas pattern is normal. No radio-opaque calculi or other  significant radiographic abnormality are seen. No acute osseous abnormality. IMPRESSION: 1. Enteric tube in the stomach. Electronically Signed   By: Titus Dubin M.D.   On: 11/23/2018 12:25   Dg Chest Port 1 View  Result Date: 11/23/2018 CLINICAL DATA:  Post intubation, OG tube placement EXAM: PORTABLE CHEST 1 VIEW COMPARISON:  11/22/2018 FINDINGS: Right Vas-Cath, endotracheal tube and NG tube remain in place, unchanged. Bilateral airspace disease again noted, worsening in the right lung since prior study. Heart is borderline in size. No acute bony abnormality. IMPRESSION: Support devices are stable. Bilateral airspace disease, worsening in the right lung since prior study. Electronically Signed   By: Rolm Baptise M.D.   On: 11/23/2018 01:26   Portable Chest X-ray  Result Date: 11/22/2018 CLINICAL DATA:  ET tube, OG tube placement EXAM: PORTABLE CHEST 1 VIEW COMPARISON:  11/22/2018 FINDINGS: Endotracheal tube is 2 cm above the carina. Right Vas-Cath tip is in the SVC. No pneumothorax. OG tube enters the stomach, tip not visualized. Cardiomegaly with bilateral airspace opacities, similar to prior study. Possible small effusions bilaterally. No acute bony abnormality. IMPRESSION: Endotracheal tube 2 cm above the carina. OG tube is in the stomach. Right Vas-Cath tip in the SVC. Bilateral airspace disease is similar to prior study. Mild cardiomegaly. Electronically Signed   By: Rolm Baptise M.D.   On: 11/22/2018 23:12   Dg Chest Portable 1 View  Result Date: 11/22/2018 CLINICAL DATA:  Fever, chest pain, shortness of breath EXAM: PORTABLE CHEST 1 VIEW COMPARISON:  09/06/2018 FINDINGS: Bilateral airspace disease noted. Heart is borderline in size. No visible effusions. No acute bony abnormality. IMPRESSION: Borderline cardiomegaly with bilateral perihilar and lower lobe airspace opacities. Findings could reflect edema/CHF or infection. Electronically Signed   By: Rolm Baptise M.D.   On: 11/22/2018 03:12     Bonnita Hollow, MD 11/28/2018, 8:04 AM PGY-2, [place] Intern pager: [phone], text pages welcome

## 2018-11-28 NOTE — Progress Notes (Addendum)
Nutrition Follow Up  RD working remotely  [place]:   Not applicable  INTERVENTION:    Nepro Shake po BID, each supplement provides 425 kcal and 19 grams protein  NEW NUTRITION DIAGNOSIS:   Increased nutrient needs related to acute illness, chronic illness as evidenced by estimated needs, ongoing  GOAL:   Patient will meet greater than or equal to 90% of their needs, progressing  MONITOR:   PO intake, Supplement acceptance, Labs, Skin, Weight trends, I & O's  ASSESSMENT:   48 yo male with PMH of ESRD-HD, CHF, HTN, HLD, DM, and malnutrition who was admitted with respiratory failure requiring intubation. COVID positive.   4/21 CVVHD started 4/25 extubated, TF discontinued via OGT 4/25 CVVHD stopped  Pt transferred out of [facility] yesterday. Briefly spoke with pt over the phone; reports a good appetite. Pt would still benefit from nutrition supplements.  His weight has fluctuated; currently 3.2 kg below admission weight. Medications include Rena-Vit, Lantus and Velphoro. Labs reviewed. Mg 3.0 (H).  CBG's [id number].  Diet Order:   Diet Order            Diet renal/carb modified with fluid restriction Diet-HS Snack? Nothing; Fluid restriction: 1200 mL Fluid; Room service appropriate? Yes; Fluid consistency: Thin  Diet effective now             EDUCATION NEEDS:   No education needs have been identified at this time  Skin:  Skin Assessment: Reviewed RN Assessment  Last BM:  4/20   Intake/Output Summary (Last 24 hours) at 11/28/2018 0955 Last data filed at 11/27/2018 1700 Gross per 24 hour  Intake 120 ml  Output -  Net 120 ml   Height:   Ht Readings from Last 1 Encounters:  11/22/18 5\' 2"  (1.575 m)   Weight:   Wt Readings from Last 1 Encounters:  11/28/18 62.1 kg   EDW 64.5 kg  Ideal Body Weight:  53.6 kg  BMI:  Body mass index is 25.04 kg/m.  Estimated Nutritional Needs:   Kcal:  2000-2200  Protein:  100-115 gm  Fluid:  1,000  ml + UOP  Arthur Holms, RD, LDN Pager #: [phone] After-Hours Pager #: [phone]

## 2018-11-28 NOTE — Progress Notes (Signed)
Patient ID: Blake Santana, male   DOB: [birth year], 48 y.o.   MRN: [id number] S: seen in room, pt no new c/o, still cough but no fevers or chills, no SOB. Eating good.  O:BP (!) 157/82 (BP Location: Right Arm)   Pulse 88   Temp 98.7 F (37.1 C) (Oral)   Resp 16   Ht 5\' 2"  (1.575 m)   Wt 60.1 kg   SpO2 96%   BMI 24.23 kg/m   Intake/Output Summary (Last 24 hours) at 11/28/2018 1735 Last data filed at 11/28/2018 1203 Gross per 24 hour  Intake -  Output 2121 ml  Net -2121 ml   Intake/Output: I/O last 3 completed shifts: In: 620 [P.O.:600; Other:20] Out: -   Intake/Output this shift:  Total I/O In: -  Out: 2121 [Other:2121] Weight change:  Alert, no distress, seen in room 4/27  no jvd Chest bibasilar crackles  Cor reg no mrg  abd soft ntnd no ascites  ext no edema  alert and in no distress, Ox 3  AVG +bruit    [place] TTS 3h 42min   64.5kg   2/2 bath  Hep 2000  AVG  Hectorol 2 mcg IV 3x week during HD Venofer 50 mg IV q week Velphoro 500 mg 2 tabs PO TID with meals  Assessment/ Plan:  1. Covid-19 infection with sepsis- now extubated and moved to 2W. Stable resp, still coughing and basilar infiltrates on CXR, no vol overload and 4kg under dry wt 2. ESRD- sp CRRT 4/21- 4/24. Had regular HD yest 2L off. Off schedule, will plan next HD Thurs then Sat.  3. Anemia:Hgb up to 10.5 4. CKD-MBD:stable 5. Nutrition:npo for now 6. Hypertension: back on home meds 7. DM- per primary 8. Quantiferon-TB Gold positive- ID consult pending, may have implications for outpatient HD, or may not.   [facility] 11/29/2018, 8:13 AM     Recent Labs  Lab 11/22/18 0244  11/23/18 0530  11/24/18 0432  11/25/18 0334 11/25/18 1600 11/26/18 0310 11/26/18 0313 11/26/18 1523 11/27/18 0319 11/28/18 0841 11/28/18 1016  NA 140   < > 137   < > 134*   < > 135 136 137 135 135 135 131* 132*  K 5.4*   < > 5.0   < > 4.6   < > 4.1 3.9 3.1* 3.0* 4.3 4.6 4.5 3.1*  CL  97*  --  99   < > 100   < > 100 99  --  98 97* 97* 93* 95*  CO2 29  --  26   < > 24   < > 24 26  --  24 23 24  21* 23  GLUCOSE 135*  --  118*   < > 213*   < > 254* 291*  --  284* 324* 133* 159* 178*  BUN 63*  --  63*   < > 42*   < > 43* 44*  --  48* 94* 116* 157* 66*  CREATININE 10.49*   < > 9.18*   < > 3.96*   < > 2.49* 2.12*  --  1.91* 3.24* 4.87* 8.83* 3.97*  ALBUMIN 3.8  --  2.7*  2.7*   < > 2.6*   < > 2.7* 3.0*  --  3.0* 3.1* 3.0* 2.9* 3.0*  CALCIUM 10.0  --  8.3*   < > 8.5*   < > 8.7* 9.0  --  9.0 9.2 9.5 9.5 8.4*  PHOS  --   --  2.7   < > 3.7   < > 2.7 2.6  --  2.2* 3.3 3.2 3.7 1.6*  AST 15  --  14*  --  16  --   --   --   --   --   --   --   --   --   ALT 11  --  10  --  12  --   --   --   --   --   --   --   --   --    < > = values in this interval not displayed.   Liver Function Tests: Recent Labs  Lab 11/22/18 0244 11/23/18 0530  11/24/18 0432  11/27/18 0319 11/28/18 0841 11/28/18 1016  AST 15 14*  --  16  --   --   --   --   ALT 11 10  --  12  --   --   --   --   ALKPHOS 87 54  --  61  --   --   --   --   BILITOT 0.6 0.8  --  0.8  --   --   --   --   PROT 7.7 6.1*  --  6.6  --   --   --   --   ALBUMIN 3.8 2.7*  2.7*   < > 2.6*   < > 3.0* 2.9* 3.0*   < > = values in this interval not displayed.   No results for input(s): LIPASE, AMYLASE in the last 168 hours. No results for input(s): AMMONIA in the last 168 hours. CBC: Recent Labs  Lab 11/22/18 0244  11/23/18 0530  11/25/18 0334  11/26/18 0313 11/27/18 0319 11/28/18 0641 11/28/18 1016  WBC 11.4*   < > 8.2   < > 7.0  --  9.0 11.9* 11.5* 9.2  NEUTROABS 8.2*  --  6.5  --   --   --   --   --   --   --   HGB 11.0*   < > 8.9*   < > 8.5*   < > 10.5* 10.9* 10.6* 10.8*  HCT 35.6*   < > 27.2*   < > 27.3*   < > 32.8* 33.6* 31.4* 31.5*  MCV 94.9   < > 96.5   < > 96.5  --  94.3 94.1 92.1 89.2  PLT 163   < > 117*   < > 128*  --  114* 125* 136* 128*   < > = values in this interval not displayed.   Cardiac  Enzymes: Recent Labs  Lab 11/22/18 1149 11/22/18 1923 11/23/18 0530 11/23/18 1711 11/28/18 1016  TROPONINI 0.08* 0.11* 0.25* 0.15* 0.07*   CBG: Recent Labs  Lab 11/27/18 2016 11/28/18 0035 11/28/18 0810 11/28/18 1139 11/28/18 1623  GLUCAP 121* 179* 153* 150* 238*    Iron Studies: No results for input(s): IRON, TIBC, TRANSFERRIN, FERRITIN in the last 72 hours. Studies/Results: No results found. Derrill Memo ON 11/29/2018] amLODipine  10 mg Oral Daily  . aspirin EC  81 mg Oral Daily  . feeding supplement (NEPRO CARB STEADY)  237 mL Oral BID BM  . gabapentin  300 mg Oral Q T,Th,Sa-HD  . heparin injection (subcutaneous)  5,000 Units Subcutaneous Q8H  . insulin aspart  0-9 Units Subcutaneous TID WC  . insulin glargine  10 Units Subcutaneous Daily  . multivitamin  1 tablet Oral QHS  . rosuvastatin  20  mg Oral Q24H  . senna-docusate  1 tablet Oral BID  . sucroferric oxyhydroxide  1,000 mg Oral TID WC    BMET    Component Value Date/Time   NA 132 (L) 11/28/2018 1016   NA 144 08/26/2018 0951   K 3.1 (L) 11/28/2018 1016   CL 95 (L) 11/28/2018 1016   CO2 23 11/28/2018 1016   GLUCOSE 178 (H) 11/28/2018 1016   BUN 66 (H) 11/28/2018 1016   BUN 28 (H) 08/26/2018 0951   CREATININE 3.97 (H) 11/28/2018 1016   CALCIUM 8.4 (L) 11/28/2018 1016   GFRNONAA 16 (L) 11/28/2018 1016   GFRAA 19 (L) 11/28/2018 1016   CBC    Component Value Date/Time   WBC 9.2 11/28/2018 1016   RBC 3.53 (L) 11/28/2018 1016   HGB 10.8 (L) 11/28/2018 1016   HGB 8.4 (L) 01/14/2017 1046   HCT 31.5 (L) 11/28/2018 1016   HCT 26.0 (L) 01/14/2017 1046   PLT 128 (L) 11/28/2018 1016   PLT 236 01/14/2017 1046   MCV 89.2 11/28/2018 1016   MCV 88 01/14/2017 1046   MCH 30.6 11/28/2018 1016   MCHC 34.3 11/28/2018 1016   RDW 14.3 11/28/2018 1016   RDW 14.1 01/14/2017 1046   LYMPHSABS 1.3 11/23/2018 0530   LYMPHSABS 1.8 01/14/2017 1046   MONOABS 0.3 11/23/2018 0530   EOSABS 0.1 11/23/2018 0530   EOSABS 0.7 (H)  01/14/2017 1046   BASOSABS 0.0 11/23/2018 0530   BASOSABS 0.0 01/14/2017 1046

## 2018-11-28 NOTE — Progress Notes (Signed)
Inpatient Diabetes Program Recommendations  AACE/ADA: New Consensus Statement on Inpatient Glycemic Control (2015)  Target Ranges:  Prepandial:   less than 140 mg/dL      Peak postprandial:   less than 180 mg/dL (1-2 hours)      Critically ill patients:  140 - 180 mg/dL   Lab Results  Component Value Date   GLUCAP 153 (H) 11/28/2018   HGBA1C 7.1 (H) 11/22/2018    Results for ACEVEDO SAYER, MASINI (MRN [id number]) as of 11/28/2018 11:12  Ref. Range 11/27/2018 07:49 11/27/2018 11:46 11/27/2018 16:54 11/27/2018 20:16 11/28/2018 00:35 11/28/2018 08:10  Glucose-Capillary Latest Ref Range: 70 - 99 mg/dL 149 (H)  Novolog 2 units 211 (H)  Novolog 5 units 262 (H)  Novolog 6 units  121 (H)  Novolog 2 units  179 (H)  Novolog 3 units  153 (H)  Novolog 3 units      Review of Glycemic Control  Diabetes history: DM2  Outpatient Diabetes medications: Glucotrol 5mg  BID  Current orders for Inpatient glycemic control: Lantus 10 units daily                                                                          Novolog moderate (0-15) correction Q4 hours    As patient is eating would recommend CBG/Novolog correction be decreased to tid ac/hs. Post prandial blood glucose is elevated therefore would recommend adding Novolog meal coverage for better control. Fasting CBG this am 153mg /dl.     MD please consider the following inpatient recommendations:  1. Decrease correction scale to Novolog SENSITIVE (0-9 units) tid and (0-5 units) hs  2. Add Novolog 3 units meal coverage tid (Hold if NPO or if patient eats less than 50% of meal.)    Thank you.  -- Will follow during hospitalization.--  Jonna Clark RN, MSN Diabetes Coordinator Inpatient Glycemic Control Team Team Pager: [phone] (8am-5pm)

## 2018-11-28 NOTE — Progress Notes (Signed)
Spoke with Jacqulyn Bath ([employer] interpreter [phone]) with pt. Pt understands risk of hemodialysis and does want dialysis while at hospital. PT able to teach back information and verbalize understanding.

## 2018-11-29 DIAGNOSIS — E1122 Type 2 diabetes mellitus with diabetic chronic kidney disease: Secondary | ICD-10-CM

## 2018-11-29 LAB — CBC
HCT: 31 % — ABNORMAL LOW (ref 39.0–52.0)
Hemoglobin: 10.5 g/dL — ABNORMAL LOW (ref 13.0–17.0)
MCH: 30.6 pg (ref 26.0–34.0)
MCHC: 33.9 g/dL (ref 30.0–36.0)
MCV: 90.4 fL (ref 80.0–100.0)
Platelets: 144 10*3/uL — ABNORMAL LOW (ref 150–400)
RBC: 3.43 MIL/uL — ABNORMAL LOW (ref 4.22–5.81)
RDW: 14.2 % (ref 11.5–15.5)
WBC: 10.7 10*3/uL — ABNORMAL HIGH (ref 4.0–10.5)
nRBC: 0 % (ref 0.0–0.2)

## 2018-11-29 LAB — GLUCOSE, CAPILLARY
Glucose-Capillary: 167 mg/dL — ABNORMAL HIGH (ref 70–99)
Glucose-Capillary: 189 mg/dL — ABNORMAL HIGH (ref 70–99)
Glucose-Capillary: 197 mg/dL — ABNORMAL HIGH (ref 70–99)
Glucose-Capillary: 212 mg/dL — ABNORMAL HIGH (ref 70–99)
Glucose-Capillary: 217 mg/dL — ABNORMAL HIGH (ref 70–99)
Glucose-Capillary: 221 mg/dL — ABNORMAL HIGH (ref 70–99)

## 2018-11-29 MED ORDER — NYSTATIN 100000 UNIT/ML MT SUSP
5.0000 mL | Freq: Four times a day (QID) | OROMUCOSAL | Status: DC
  Administered 2018-11-29 – 2018-12-01 (×8): 500000 [IU] via OROMUCOSAL
  Filled 2018-11-29 (×7): qty 5

## 2018-11-29 MED ORDER — METOPROLOL TARTRATE 25 MG PO TABS
25.0000 mg | ORAL_TABLET | Freq: Two times a day (BID) | ORAL | Status: DC
  Administered 2018-11-29 – 2018-12-01 (×4): 25 mg via ORAL
  Filled 2018-11-29 (×4): qty 1

## 2018-11-29 MED ORDER — TUBERCULIN PPD 5 UNIT/0.1ML ID SOLN
5.0000 [IU] | Freq: Once | INTRADERMAL | Status: DC
  Filled 2018-11-29: qty 0.1

## 2018-11-29 NOTE — Progress Notes (Signed)
Patient ID: Blake Santana, male   DOB: Apr 04, 1971, 48 y.o.   MRN: [id number] S: per RN, no new c/o and patient is stable   O:BP (!) 162/81 (BP Location: Right Arm)   Pulse 83   Temp 98 F (36.7 C) (Oral)   Resp 16   Ht 5\' 2"  (1.575 m)   Wt 60.1 kg   SpO2 98%   BMI 24.23 kg/m   Intake/Output Summary (Last 24 hours) at 11/29/2018 1019 Last data filed at 11/28/2018 1203 Gross per 24 hour  Intake -  Output 2121 ml  Net -2121 ml   Intake/Output: I/O last 3 completed shifts: In: -  Out: 2121 [Other:2121]  Intake/Output this shift:  No intake/output data recorded. Weight change:   Patient not examined directly given COVID-19 + status, utilizing exam of the primary team and observations of RN's.   Home meds: - amlodipine 5 qd/ metoprolol 25 bid - rosuvastatin 20 / aspirin 81 - gabapentin 400 TTS - glipizide 5 bid  - prn's/ vitamins/ supplements  [place] TTS 3h 77min   64.5kg   2/2 bath  Hep 2000  AVG  Hectorol 2 mcg IV 3x week during HD Venofer 50 mg IV q week Velphoro 500 mg 2 tabs PO TID with meals   Assessment/ Plan:  1. Covid-19 infection with sepsis- now extubated and moved to 2W on 1L [place].  Stable.  2. ESRD- sp CRRT 4/21- 4/24. Had dialysis yest 4/27, next HD 4/30 then will be back on TTS schedule.  3. Volume - 4kg under prior dry 4. Anemia:Hgb up to 10.5 5. CKD-MBD:stable 6. Nutrition:npo for now 7. Hypertension: on home meds norvasc/ metop 8. DM- per primary 9. Quantiferon-TB Gold positive- latent vs active infection question, ID consult pending  [facility] 11/29/2018, 10:19 AM     Recent Labs  Lab 11/23/18 0530  11/24/18 0432  11/25/18 0334 11/25/18 1600 11/26/18 0310 11/26/18 0313 11/26/18 1523 11/27/18 0319 11/28/18 0841 11/28/18 1016  NA 137   < > 134*   < > 135 136 137 135 135 135 131* 132*  K 5.0   < > 4.6   < > 4.1 3.9 3.1* 3.0* 4.3 4.6 4.5 3.1*  CL 99   < > 100   < > 100 99  --  98 97* 97* 93* 95*  CO2 26    < > 24   < > 24 26  --  24 23 24  21* 23  GLUCOSE 118*   < > 213*   < > 254* 291*  --  284* 324* 133* 159* 178*  BUN 63*   < > 42*   < > 43* 44*  --  48* 94* 116* 157* 66*  CREATININE 9.18*   < > 3.96*   < > 2.49* 2.12*  --  1.91* 3.24* 4.87* 8.83* 3.97*  ALBUMIN 2.7*  2.7*   < > 2.6*   < > 2.7* 3.0*  --  3.0* 3.1* 3.0* 2.9* 3.0*  CALCIUM 8.3*   < > 8.5*   < > 8.7* 9.0  --  9.0 9.2 9.5 9.5 8.4*  PHOS 2.7   < > 3.7   < > 2.7 2.6  --  2.2* 3.3 3.2 3.7 1.6*  AST 14*  --  16  --   --   --   --   --   --   --   --   --   ALT 10  --  12  --   --   --   --   --   --   --   --   --    < > = values in this interval not displayed.   Liver Function Tests: Recent Labs  Lab 11/23/18 0530  11/24/18 0432  11/27/18 0319 11/28/18 0841 11/28/18 1016  AST 14*  --  16  --   --   --   --   ALT 10  --  12  --   --   --   --   ALKPHOS 54  --  61  --   --   --   --   BILITOT 0.8  --  0.8  --   --   --   --   PROT 6.1*  --  6.6  --   --   --   --   ALBUMIN 2.7*  2.7*   < > 2.6*   < > 3.0* 2.9* 3.0*   < > = values in this interval not displayed.   No results for input(s): LIPASE, AMYLASE in the last 168 hours. No results for input(s): AMMONIA in the last 168 hours. CBC: Recent Labs  Lab 11/23/18 0530  11/26/18 0313 11/27/18 0319 11/28/18 0641 11/28/18 1016 11/29/18 0627  WBC 8.2   < > 9.0 11.9* 11.5* 9.2 10.7*  NEUTROABS 6.5  --   --   --   --   --   --   HGB 8.9*   < > 10.5* 10.9* 10.6* 10.8* 10.5*  HCT 27.2*   < > 32.8* 33.6* 31.4* 31.5* 31.0*  MCV 96.5   < > 94.3 94.1 92.1 89.2 90.4  PLT 117*   < > 114* 125* 136* 128* 144*   < > = values in this interval not displayed.   Cardiac Enzymes: Recent Labs  Lab 11/22/18 1149 11/22/18 1923 11/23/18 0530 11/23/18 1711 11/28/18 1016  TROPONINI 0.08* 0.11* 0.25* 0.15* 0.07*   CBG: Recent Labs  Lab 11/28/18 1623 11/28/18 2103 11/29/18 0045 11/29/18 0454 11/29/18 0945  GLUCAP 238* 275* 221* 189* 217*    Iron Studies: No results for  input(s): IRON, TIBC, TRANSFERRIN, FERRITIN in the last 72 hours. Studies/Results: No results found. Marland Kitchen amLODipine  10 mg Oral Daily  . aspirin EC  81 mg Oral Daily  . feeding supplement (NEPRO CARB STEADY)  237 mL Oral BID BM  . gabapentin  300 mg Oral Q T,Th,Sa-HD  . heparin injection (subcutaneous)  5,000 Units Subcutaneous Q8H  . insulin aspart  0-9 Units Subcutaneous TID WC  . insulin glargine  10 Units Subcutaneous Daily  . multivitamin  1 tablet Oral QHS  . rosuvastatin  20 mg Oral Q24H  . senna-docusate  1 tablet Oral BID  . sucroferric oxyhydroxide  1,000 mg Oral TID WC    BMET    Component Value Date/Time   NA 132 (L) 11/28/2018 1016   NA 144 08/26/2018 0951   K 3.1 (L) 11/28/2018 1016   CL 95 (L) 11/28/2018 1016   CO2 23 11/28/2018 1016   GLUCOSE 178 (H) 11/28/2018 1016   BUN 66 (H) 11/28/2018 1016   BUN 28 (H) 08/26/2018 0951   CREATININE 3.97 (H) 11/28/2018 1016   CALCIUM 8.4 (L) 11/28/2018 1016   GFRNONAA 16 (L) 11/28/2018 1016   GFRAA 19 (L) 11/28/2018 1016   CBC    Component Value Date/Time   WBC 10.7 (H) 11/29/2018 0627   RBC  3.43 (L) 11/29/2018 0627   HGB 10.5 (L) 11/29/2018 0627   HGB 8.4 (L) 01/14/2017 1046   HCT 31.0 (L) 11/29/2018 0627   HCT 26.0 (L) 01/14/2017 1046   PLT 144 (L) 11/29/2018 0627   PLT 236 01/14/2017 1046   MCV 90.4 11/29/2018 0627   MCV 88 01/14/2017 1046   MCH 30.6 11/29/2018 0627   MCHC 33.9 11/29/2018 0627   RDW 14.2 11/29/2018 0627   RDW 14.1 01/14/2017 1046   LYMPHSABS 1.3 11/23/2018 0530   LYMPHSABS 1.8 01/14/2017 1046   MONOABS 0.3 11/23/2018 0530   EOSABS 0.1 11/23/2018 0530   EOSABS 0.7 (H) 01/14/2017 1046   BASOSABS 0.0 11/23/2018 0530   BASOSABS 0.0 01/14/2017 1046

## 2018-11-29 NOTE — Progress Notes (Signed)
Inpatient Diabetes Program Recommendations  AACE/ADA: New Consensus Statement on Inpatient Glycemic Control (2015)  Target Ranges:  Prepandial:   less than 140 mg/dL      Peak postprandial:   less than 180 mg/dL (1-2 hours)      Critically ill patients:  140 - 180 mg/dL   Lab Results  Component Value Date   GLUCAP 217 (H) 11/29/2018   HGBA1C 7.1 (H) 11/22/2018    Review of Glycemic Control Results for ACEVEDO D'ARCY, ABRAHA (MRN [id number]) as of 11/29/2018 10:09  Ref. Range 11/29/2018 00:45 11/29/2018 04:54 11/29/2018 09:45  Glucose-Capillary Latest Ref Range: 70 - 99 mg/dL 221 (H) 189 (H) 217 (H)   Diabetes history: DM2  Outpatient Diabetes medications: Glucotrol 5mg  BID  Current orders for Inpatient glycemic control: Lantus 10 units daily, Novolog 0-9 units TID   Inpatient Diabetes Program Recommendations:    Patient was recently transition from Q4H to TID dosing, could use slightly more basal.   Consider increasing Lantus 14 to units QD.   Thanks, Bronson Curb, MSN, RNC-OB Diabetes Coordinator [phone] (8a-5p)

## 2018-11-29 NOTE — Progress Notes (Addendum)
Family Medicine Teaching Service Daily Progress Note Intern Pager: [phone]  Patient name: Blake Santana Medical record number: [id number] Date of birth: 21-Oct-1970 Age: 48 y.o. Gender: male  Primary Care Provider: Mack Hook, MD Consultants: CCM, ID Code Status:   Code Status: Full Code   Pt Overview and Major Events to Date:  Blake Santana is a 48 y.o. male presented with 4 days of SOB and chest pain. PMH is significant for  has a past medical history of AOCD, HFpEF, ESRD on HD, DMT2 HLD, HTN, CAD  4/21 - Admission. Worsening respiratory status. Pulmonary/CCM consulted. Patient intubated. SARS-CoV2 Positive (1 of 2). OETT. R radial arterial line. R IJ CVL placed. Quantiferon Gold +.  4/25 - Extubated.  4/27 - Stable on 3L. BP stable without pressors. Tolerating PO. Transferred back to FPTS 1. ID consulted. 4/28 - Improved respiratory status, only requiring 1L O2.  Remains weak and outpatient HD location is uncertain due to COVID+ and Quant Gold + status  Antibiotic Course Vancomycin (4/21-4/23) Cefepime (4/21-4/24)  Assessment and Plan:  #Acute hypoxic respiratory failure #ARDS #COVID-19 Status post intubation and extubation. S/p Tocluzimab X 1, Vancomycin and Cefepime. Stable on 1 L.  No O2 requirement at home.  WBC is stable at 10.7 on 4/28.  Continuous pulse ox  Oxygen therapy as needed, wean as tolerated  WOAT  PT/OT  Consider when to retest patient, needs two negative tests or will go to [facility] HD location, which may be difficult for him since he has emergency Medicaid and can only go to one HD location  Ask nephrology for help with outpatient HD plans  #Positive QuantiFERON gold.   Sputum AFB pending.   Health dept referral at D/C  ID consulted on 4/27, appreciate recommendations  F/u w/ outpatient dialysis for receiving HD, will need to receive inpatient dialysis if he has any positive sputum samples  #Oral thrush. Likely due to  steroid use and critical illness.  Involves the bilateral buccal mucosa. - start Nystatin rinse four times daily for at least 7 days  #ESRD on HD.  Outpatient schedule TTS.  HD is being done in his room due to his COVID+ status.  Consult nephrology, appreciate recommendations  Daily weights  Strict I's and O's  #DMT2 with neuropathy Hemoglobin A1c 7.1 on admission.  Patient take glipizide at home. Glucose has been in low 200s over the 24 hours.  Cont to hold glipizide  SSI + TID CBG checks with meals  Watch glucose measurements for one more day, increase Lantus 4/29 if needed  #HLD  Continue home rosuvastatin  #HFpEF EF 55 to 55% on 08/2018  Daily weights  Strict I's and O's  Restart metoprolol 25 mg BID  #HTN Patient hypertensive on BP 180/90.  Home medications include amlodipine and metoprolol.  Consult nephrology, appreciate recommendations  Increase amlodipine to 10 mg daily  Restart home metoprolol if BP/HR stable  #AOCD #Iron deficiency anemia  Hemoglobin stable at 10.5. BL ~11.  Monitor hemoglobin  FEN/GI: Renal/Carb modified PPx: Heparin per pharmacy (to start this PM)  Disposition: progressive pending improved respiratory status  Subjective:  Evaluated by attending.   Objective: Temp:  [97.7 F (36.5 C)-98.7 F (37.1 C)] 98 F (36.7 C) (04/28 0028) Pulse Rate:  [74-95] 83 (04/28 0028) Resp:  [16-18] 16 (04/28 0028) BP: (84-160)/(41-93) 160/93 (04/28 0028) SpO2:  [94 %-96 %] 96 % (04/28 0028) Weight:  [60.1 kg-62.1 kg] 60.1 kg (04/27 1203) Physical Exam: Evaluated by attending.  Laboratory: Recent Labs  Lab 11/28/18 0641 11/28/18 1016 11/29/18 0627  WBC 11.5* 9.2 10.7*  HGB 10.6* 10.8* 10.5*  HCT 31.4* 31.5* 31.0*  PLT 136* 128* 144*   Recent Labs  Lab 11/23/18 0530  11/24/18 0432  11/27/18 0319 11/28/18 0841 11/28/18 1016  NA 137   < > 134*   < > 135 131* 132*  K 5.0   < > 4.6   < > 4.6 4.5 3.1*  CL 99   < > 100    < > 97* 93* 95*  CO2 26   < > 24   < > 24 21* 23  BUN 63*   < > 42*   < > 116* 157* 66*  CREATININE 9.18*   < > 3.96*   < > 4.87* 8.83* 3.97*  CALCIUM 8.3*   < > 8.5*   < > 9.5 9.5 8.4*  PROT 6.1*  --  6.6  --   --   --   --   BILITOT 0.8  --  0.8  --   --   --   --   ALKPHOS 54  --  61  --   --   --   --   ALT 10  --  12  --   --   --   --   AST 14*  --  16  --   --   --   --   GLUCOSE 118*   < > 213*   < > 133* 159* 178*   < > = values in this interval not displayed.      Imaging/Diagnostic Tests: Dg Abd 1 View  Result Date: 11/23/2018 CLINICAL DATA:  OG tube placement. EXAM: ABDOMEN - 1 VIEW COMPARISON:  None. FINDINGS: Enteric tube in the stomach. The bowel gas pattern is normal. No radio-opaque calculi or other significant radiographic abnormality are seen. No acute osseous abnormality. IMPRESSION: 1. Enteric tube in the stomach. Electronically Signed   By: Titus Dubin M.D.   On: 11/23/2018 12:25   Dg Chest Port 1 View  Result Date: 11/23/2018 CLINICAL DATA:  Post intubation, OG tube placement EXAM: PORTABLE CHEST 1 VIEW COMPARISON:  11/22/2018 FINDINGS: Right Vas-Cath, endotracheal tube and NG tube remain in place, unchanged. Bilateral airspace disease again noted, worsening in the right lung since prior study. Heart is borderline in size. No acute bony abnormality. IMPRESSION: Support devices are stable. Bilateral airspace disease, worsening in the right lung since prior study. Electronically Signed   By: Rolm Baptise M.D.   On: 11/23/2018 01:26   Portable Chest X-ray  Result Date: 11/22/2018 CLINICAL DATA:  ET tube, OG tube placement EXAM: PORTABLE CHEST 1 VIEW COMPARISON:  11/22/2018 FINDINGS: Endotracheal tube is 2 cm above the carina. Right Vas-Cath tip is in the SVC. No pneumothorax. OG tube enters the stomach, tip not visualized. Cardiomegaly with bilateral airspace opacities, similar to prior study. Possible small effusions bilaterally. No acute bony abnormality.  IMPRESSION: Endotracheal tube 2 cm above the carina. OG tube is in the stomach. Right Vas-Cath tip in the SVC. Bilateral airspace disease is similar to prior study. Mild cardiomegaly. Electronically Signed   By: Rolm Baptise M.D.   On: 11/22/2018 23:12   Dg Chest Portable 1 View  Result Date: 11/22/2018 CLINICAL DATA:  Fever, chest pain, shortness of breath EXAM: PORTABLE CHEST 1 VIEW COMPARISON:  09/06/2018 FINDINGS: Bilateral airspace disease noted. Heart is borderline in size. No visible effusions. No acute bony  abnormality. IMPRESSION: Borderline cardiomegaly with bilateral perihilar and lower lobe airspace opacities. Findings could reflect edema/CHF or infection. Electronically Signed   By: Rolm Baptise M.D.   On: 11/22/2018 03:12    Kathrene Alu, MD 11/29/2018, 8:18 AM PGY-2, [facility] Intern pager: [phone], text pages welcome

## 2018-11-29 NOTE — Evaluation (Signed)
Occupational Therapy Evaluation Patient Details Name: Blake Santana MRN: [id number] DOB: [birth year] Today's Date: 11/29/2018    History of Present Illness Blake Santana is a 48 y.o. male presented with 4 days of SOB and chest pain. PMH is significant for  has a past medical history of AOCD, HFpEF, ESRD on HD, DMT2 HLD, HTN, CAD. Tested covid + on 4/21.   Clinical Impression   PTA, pt was living with his wife and children and was performing BADLs and simple IADLs. Pt currently requires Supervision-Min Guard A for safety during ADLs. Pt requiring Min Guard-Min A for functional mobility and presents with decreased balance as he fatigued. Pt presenting with decreased activity tolerance as seen by fatigue, requiring rest breaks, and SOB. SpO2 maintaining >94% on RA. Pt would benefit from further acute OT to facilitate safe dc. Recommend dc to home once medically stable per physician.      Follow Up Recommendations  No OT follow up;Supervision/Assistance - 24 hour    Equipment Recommendations  Tub/shower seat(Confirm DME and pending progress)    Recommendations for Other Services PT consult     Precautions / Restrictions Precautions Precautions: Other (comment) Precaution Comments: COVID +. Air/Con pre Restrictions Weight Bearing Restrictions: No      Mobility Bed Mobility Overal bed mobility: Modified Independent             General bed mobility comments: Increased time  Transfers Overall transfer level: Needs assistance Equipment used: None Transfers: Sit to/from Stand Sit to Stand: Supervision;Min guard         General transfer comment: Supervision-Min Guard A for safety    Balance Overall balance assessment: Needs assistance Sitting-balance support: No upper extremity supported;Feet supported Sitting balance-Leahy Scale: Good     Standing balance support: No upper extremity supported;During functional activity Standing balance-Leahy Scale:  Fair Standing balance comment: Requiring increased assistance with fatigue                           ADL either performed or assessed with clinical judgement   ADL Overall ADL's : Needs assistance/impaired Eating/Feeding: Set up;Sitting;Supervision/ safety   Grooming: Oral care;Min guard;Standing Grooming Details (indicate cue type and reason): Min Guard A for safety Upper Body Bathing: Supervision/ safety;Sitting   Lower Body Bathing: Min guard;Sit to/from stand   Upper Body Dressing : Supervision/safety;Sitting   Lower Body Dressing: Min guard;Sit to/from stand   Toilet Transfer: Min guard;Ambulation(simulated to recliner) Armed forces technical officer Details (indicate cue type and reason): Min Guard A for safety         Functional mobility during ADLs: Min guard;Minimal assistance(Decreased balance with fatigue) General ADL Comments: Pt presenting with decreased balance and activity tolerance     Vision Baseline Vision/History: No visual deficits(uses magnifying glass)       Perception     Praxis      Pertinent Vitals/Pain Pain Assessment: Faces Faces Pain Scale: Hurts a little bit Pain Location: around his mouth Pain Descriptors / Indicators: Discomfort Pain Intervention(s): Monitored during session     Hand Dominance Right   Extremity/Trunk Assessment Upper Extremity Assessment Upper Extremity Assessment: Generalized weakness   Lower Extremity Assessment Lower Extremity Assessment: Defer to PT evaluation   Cervical / Trunk Assessment Cervical / Trunk Assessment: Normal   Communication Communication Communication: Prefers language other than English(Speak a little English)   Cognition Arousal/Alertness: Awake/alert Behavior During Therapy: WFL for tasks assessed/performed Overall Cognitive Status: Within Functional Limits for tasks assessed  General Comments: Pt following cues and attending to ADLs.  Awareness of deconditioning and functional limited   General Comments  SpO2 >94% on RA    Exercises     Shoulder Instructions      Home Living Family/patient expects to be discharged to:: Private residence Living Arrangements: Spouse/significant other;Children Available Help at Discharge: Family;Available 24 hours/day Type of Home: House Home Access: Stairs to enter [employer] of Steps: 2-3   Home Layout: One level     Bathroom Shower/Tub: Teacher, early years/pre: Standard     Home Equipment: None          Prior Functioning/Environment Level of Independence: Independent        Comments: Performs ADLs. Reports he will do simple IADLs. wife performs IADLs and drives him to/from HD        OT Problem List: Decreased activity tolerance;Impaired balance (sitting and/or standing);Decreased strength;Decreased knowledge of use of DME or AE;Decreased knowledge of precautions      OT Treatment/Interventions: Self-care/ADL training;Therapeutic exercise;Energy conservation;DME and/or AE instruction;Therapeutic activities;Patient/family education    OT Goals(Current goals can be found in the care plan section) Acute Rehab OT Goals Patient Stated Goal: "Go home" OT Goal Formulation: With patient Time For Goal Achievement: 12/13/18 Potential to Achieve Goals: Good  OT Frequency: Min 2X/week   Barriers to D/C:            Co-evaluation              AM-PAC OT "6 Clicks" Daily Activity     Outcome Measure Help from another person eating meals?: None Help from another person taking care of personal grooming?: A Little Help from another person toileting, which includes using toliet, bedpan, or urinal?: A Little Help from another person bathing (including washing, rinsing, drying)?: A Little Help from another person to put on and taking off regular upper body clothing?: None Help from another person to put on and taking off regular lower body  clothing?: A Little 6 Click Score: 20   End of Session Nurse Communication: Mobility status  Activity Tolerance: Patient tolerated treatment well Patient left: in chair;with call bell/phone within reach;with chair alarm set  OT Visit Diagnosis: Unsteadiness on feet (R26.81);Other abnormalities of gait and mobility (R26.89);Muscle weakness (generalized) (M62.81)                Time: 7124-5809 OT Time Calculation (min): 36 min Charges:  OT General Charges $OT Visit: 1 Visit OT Evaluation $OT Eval Moderate Complexity: [place], OTR/L Acute Rehab Pager: [phone] Office: [place] 11/29/2018, 12:23 PM

## 2018-11-29 NOTE — Evaluation (Signed)
Physical Therapy Evaluation Patient Details Name: Blake Santana MRN: [id number] DOB: [birth year] Today's Date: 11/29/2018   History of Present Illness  Blake Santana is a 48 y.o. male presented with 4 days of SOB and chest pain. PMH is significant for  has a past medical history of AOCD, HFpEF, ESRD on HD, DMT2 HLD, HTN, CAD. Tested covid + on 4/21.  Clinical Impression  Pt admitted with above diagnosis. Pt currently with functional limitations due to the deficits listed below (see PT Problem List). Pt mobilizing well but fatigues quickly and noted unsteadiness as he fatigued. Ambulated within room with no AD but began with min-guard A and progressed to needing light min A due to occasional stagger. SpO2 maintained mid 90's on RA.  Pt will benefit from skilled PT to increase their independence and safety with mobility to allow discharge to the venue listed below.       Follow Up Recommendations No PT follow up    Equipment Recommendations  None recommended by PT    Recommendations for Other Services       Precautions / Restrictions Precautions Precautions: Other (comment) Precaution Comments: COVID +. Air/Con pre Restrictions Weight Bearing Restrictions: No      Mobility  Bed Mobility Overal bed mobility: Modified Independent             General bed mobility comments: Increased time  Transfers Overall transfer level: Needs assistance Equipment used: None Transfers: Sit to/from Stand Sit to Stand: Supervision;Min guard         General transfer comment: Supervision-Min Guard A for safety  Ambulation/Gait Ambulation/Gait assistance: Min guard;Min assist Gait Distance (Feet): 30 Feet Assistive device: None Gait Pattern/deviations: Staggering right;Staggering left Gait velocity: decreased Gait velocity interpretation: 1.31 - 2.62 ft/sec, indicative of limited community ambulator General Gait Details: pt began ambulating with min-guard A but required min  A with turning and as he fatigued, occasional stagger and shoulder into wall.   Stairs            Wheelchair Mobility    Modified Rankin (Stroke Patients Only)       Balance Overall balance assessment: Needs assistance Sitting-balance support: No upper extremity supported;Feet supported Sitting balance-Leahy Scale: Good     Standing balance support: No upper extremity supported;During functional activity Standing balance-Leahy Scale: Fair Standing balance comment: Requiring increased assistance with fatigue                             Pertinent Vitals/Pain Pain Assessment: Faces Faces Pain Scale: Hurts a little bit Pain Location: around his mouth Pain Descriptors / Indicators: Discomfort Pain Intervention(s): Monitored during session    Home Living Family/patient expects to be discharged to:: Private residence Living Arrangements: Spouse/significant other;Children Available Help at Discharge: Family;Available 24 hours/day Type of Home: House Home Access: Stairs to enter   [employer] of Steps: 2-3 Home Layout: One level Home Equipment: None      Prior Function Level of Independence: Independent         Comments: Performs ADLs. Reports he will do simple IADLs. wife performs IADLs and drives him to/from HD     Hand Dominance   Dominant Hand: Right    Extremity/Trunk Assessment   Upper Extremity Assessment Upper Extremity Assessment: Defer to OT evaluation    Lower Extremity Assessment Lower Extremity Assessment: Generalized weakness    Cervical / Trunk Assessment Cervical / Trunk Assessment: Normal  Communication   Communication:  Prefers language other than English(Speak a little [place])  Cognition Arousal/Alertness: Awake/alert Behavior During Therapy: WFL for tasks assessed/performed Overall Cognitive Status: Within Functional Limits for tasks assessed                                 General Comments:  Pt following cues and attending to ADLs. Awareness of deconditioning and functional limited      General Comments General comments (skin integrity, edema, etc.): SpO2 mid 90's on RA    Exercises     Assessment/Plan    PT Assessment Patient needs continued PT services  PT Problem List Decreased strength;Decreased activity tolerance;Decreased balance;Decreased mobility;Decreased knowledge of precautions       PT Treatment Interventions DME instruction;Gait training;Stair training;Functional mobility training;Therapeutic activities;Therapeutic exercise;Balance training;Patient/family education;Neuromuscular re-education    PT Goals (Current goals can be found in the Care Plan section)  Acute Rehab PT Goals Patient Stated Goal: "Go home" PT Goal Formulation: With patient Time For Goal Achievement: 12/13/18 Potential to Achieve Goals: Good    Frequency Min 3X/week   Barriers to discharge        Co-evaluation PT/OT/SLP Co-Evaluation/Treatment: Yes Reason for Co-Treatment: Complexity of the patient's impairments (multi-system involvement);For patient/therapist safety PT goals addressed during session: Mobility/safety with mobility;Balance         AM-PAC PT "6 Clicks" Mobility  Outcome Measure Help needed turning from your back to your side while in a flat bed without using bedrails?: None Help needed moving from lying on your back to sitting on the side of a flat bed without using bedrails?: None Help needed moving to and from a bed to a chair (including a wheelchair)?: None Help needed standing up from a chair using your arms (e.g., wheelchair or bedside chair)?: None Help needed to walk in hospital room?: A Little Help needed climbing 3-5 steps with a railing? : A Little 6 Click Score: 22    End of Session   Activity Tolerance: Patient tolerated treatment well Patient left: in chair;with call bell/phone within reach;with chair alarm set Nurse Communication: Mobility  status PT Visit Diagnosis: Unsteadiness on feet (R26.81);Muscle weakness (generalized) (M62.81)    Time: 6837-2902 PT Time Calculation (min) (ACUTE ONLY): 36 min   Charges:   PT Evaluation $PT Eval Moderate Complexity: [place]  Pager [phone] Office [place] 11/29/2018, 2:27 PM

## 2018-11-29 NOTE — Progress Notes (Addendum)
Spoke with Dr. Prince Rome, ID, who stated that they would not be formally consulted on patient as patient is unlikely to be able to give expectorated sputum and therefore should fall into Health Department domain.  Low suspicion for active TB given his CXR per Dr. Prince Rome' read.  Patient will also be quarantined on d/c therefore would not change that management.  He noted that Dr. Baxter Flattery was planning to reach out to Health Department today as well.    Dr. Prince Rome recommends obtaining Expectorated Sputum x3 if possible, although unlikely, obtaining HIV, and getting PPD.  Appreciate recommendations provided by ID.  Will reach out to Dr. Baxter Flattery regarding Health Department input.  Spoke with Dr. Baxter Flattery who does not want PPD obtained as would not change management.  She will reach out to Health Department and call back.  Dr. Baxter Flattery called and informed that she spoke with Health Dept and patient is not infectious at present, and he requires no further TB work-up.  He should be masked in public and in HD.  If cultures are positive, will re-assess.  [place] Constable, D.O.  PGY-1 Family Medicine  11/29/2018 2:11 PM

## 2018-11-30 ENCOUNTER — Inpatient Hospital Stay: Payer: Self-pay | Admitting: Primary Care

## 2018-11-30 DIAGNOSIS — R7612 Nonspecific reaction to cell mediated immunity measurement of gamma interferon antigen response without active tuberculosis: Secondary | ICD-10-CM

## 2018-11-30 DIAGNOSIS — B370 Candidal stomatitis: Secondary | ICD-10-CM

## 2018-11-30 LAB — CBC
HCT: 30.1 % — ABNORMAL LOW (ref 39.0–52.0)
Hemoglobin: 10.2 g/dL — ABNORMAL LOW (ref 13.0–17.0)
MCH: 30.3 pg (ref 26.0–34.0)
MCHC: 33.9 g/dL (ref 30.0–36.0)
MCV: 89.3 fL (ref 80.0–100.0)
Platelets: 174 10*3/uL (ref 150–400)
RBC: 3.37 MIL/uL — ABNORMAL LOW (ref 4.22–5.81)
RDW: 13.8 % (ref 11.5–15.5)
WBC: 11.3 10*3/uL — ABNORMAL HIGH (ref 4.0–10.5)
nRBC: 0 % (ref 0.0–0.2)

## 2018-11-30 LAB — GLUCOSE, CAPILLARY
Glucose-Capillary: 238 mg/dL — ABNORMAL HIGH (ref 70–99)
Glucose-Capillary: 238 mg/dL — ABNORMAL HIGH (ref 70–99)
Glucose-Capillary: 171 mg/dL — ABNORMAL HIGH (ref 70–99)
Glucose-Capillary: 123 mg/dL — ABNORMAL HIGH (ref 70–99)

## 2018-11-30 LAB — RENAL FUNCTION PANEL
Albumin: 3.2 g/dL — ABNORMAL LOW (ref 3.5–5.0)
Anion gap: 17 — ABNORMAL HIGH (ref 5–15)
BUN: 126 mg/dL — ABNORMAL HIGH (ref 6–20)
CO2: 26 mmol/L (ref 22–32)
Calcium: 9.5 mg/dL (ref 8.9–10.3)
Chloride: 89 mmol/L — ABNORMAL LOW (ref 98–111)
Creatinine, Ser: 9.56 mg/dL — ABNORMAL HIGH (ref 0.61–1.24)
GFR calc Af Amer: 7 mL/min — ABNORMAL LOW
GFR calc non Af Amer: 6 mL/min — ABNORMAL LOW
Glucose, Bld: 233 mg/dL — ABNORMAL HIGH (ref 70–99)
Phosphorus: 5.2 mg/dL — ABNORMAL HIGH (ref 2.5–4.6)
Potassium: 4.4 mmol/L (ref 3.5–5.1)
Sodium: 132 mmol/L — ABNORMAL LOW (ref 135–145)

## 2018-11-30 LAB — HIV ANTIBODY (ROUTINE TESTING W REFLEX): HIV Screen 4th Generation wRfx: NONREACTIVE

## 2018-11-30 IMAGING — DX DG CHEST 2V
2 series · 2 of 2 positions shown · non-contrast
Comparison: None.

CLINICAL DATA: Swelling of the feet, legs and pelvic area.

EXAM:
CHEST  2 VIEW

[chest pa]
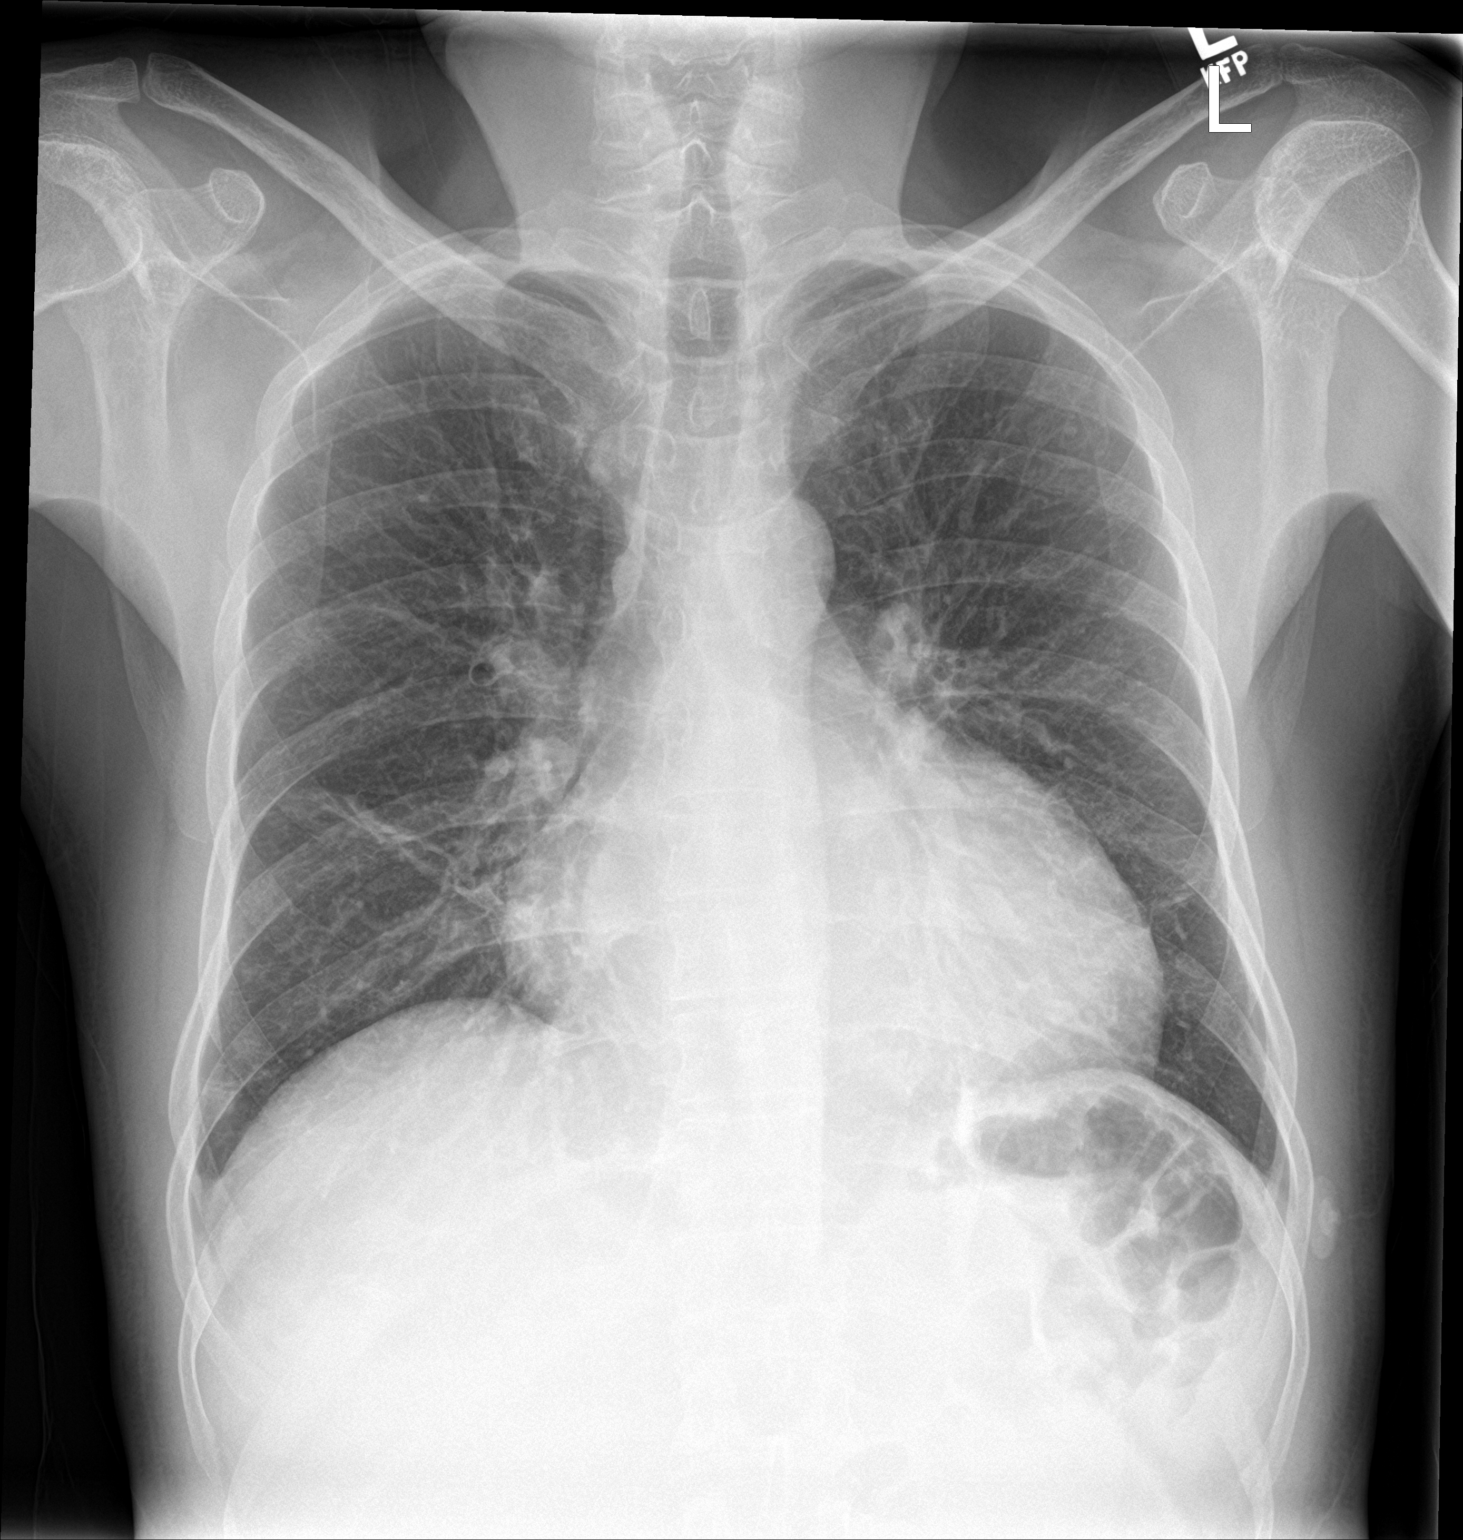

[chest lat]
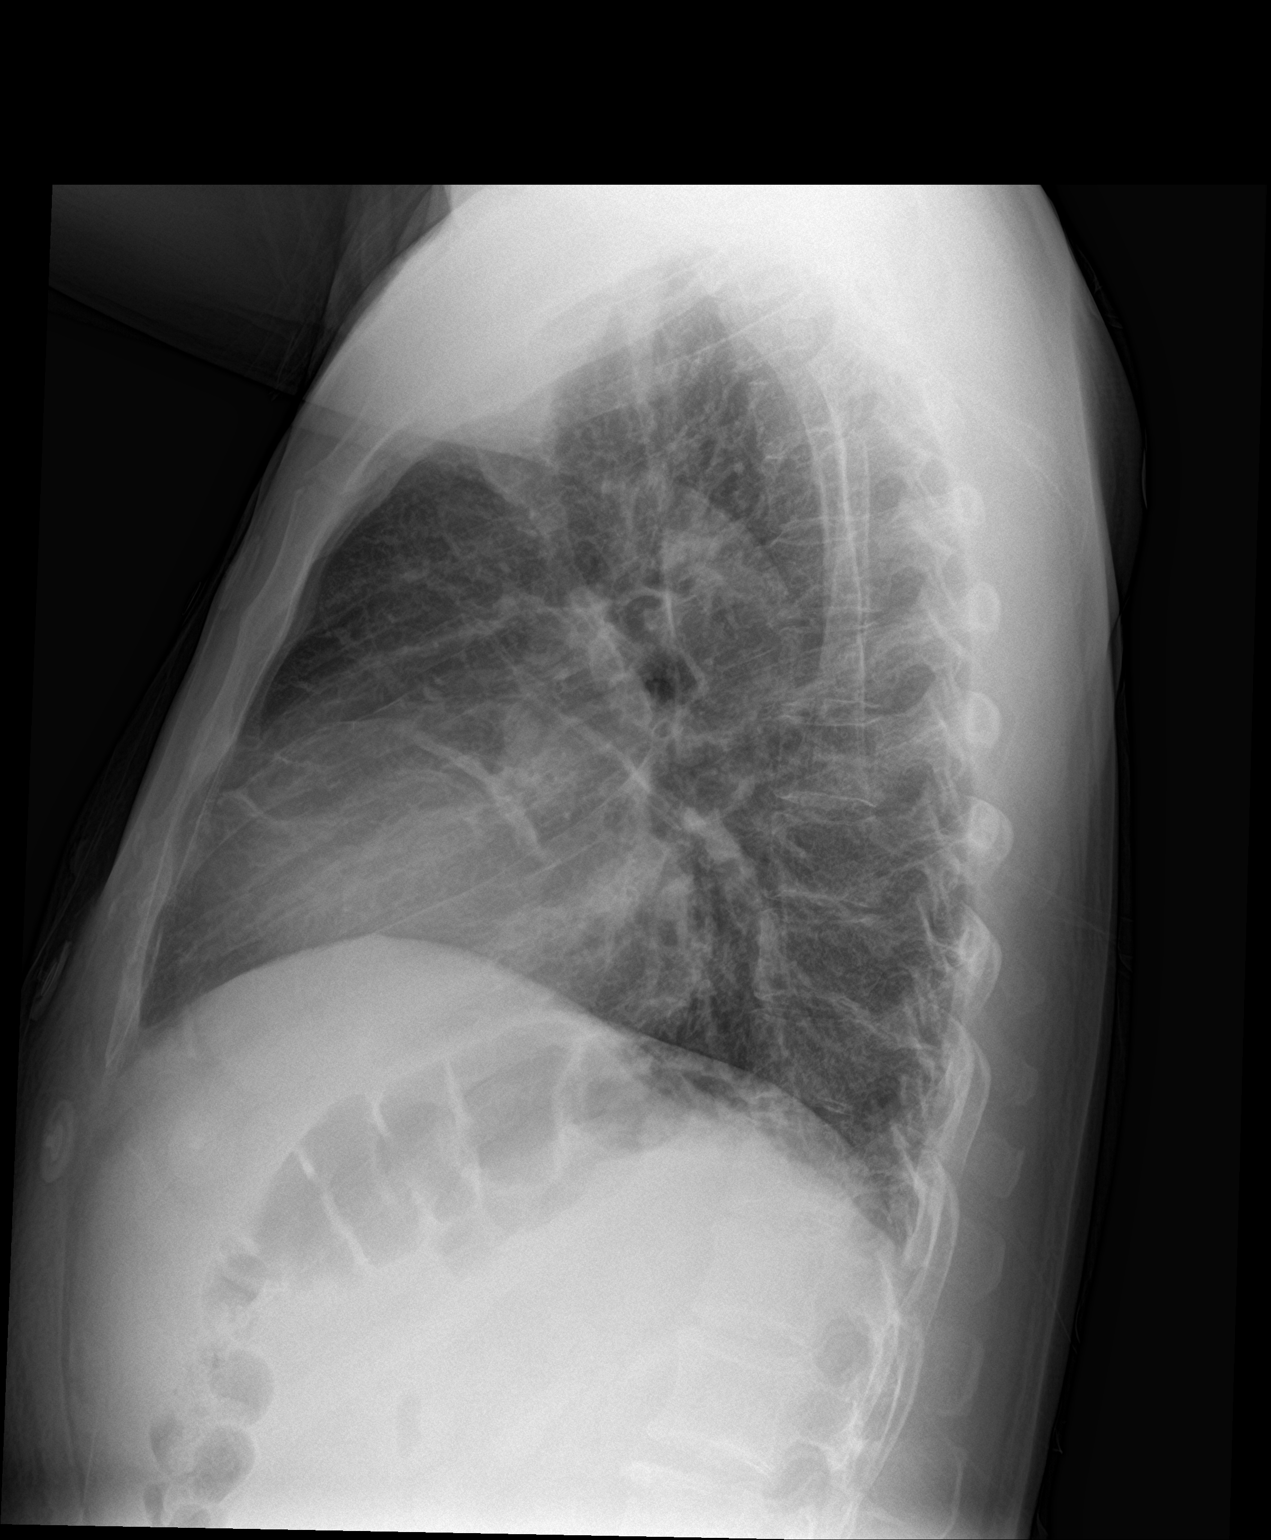

[2 of 2 positions shown; findings below may reference images not displayed]

FINDINGS: Cardiomegaly is noted without overt pulmonary edema. No pneumonic
consolidation, effusion or pneumothorax. Streaky atelectasis and/or
scarring is noted in the right middle lobe and left lung base. The
aorta is not aneurysmal. No acute nor suspicious osseous
abnormality.
IMPRESSION: Cardiomegaly without acute pulmonary abnormality. Streaky
atelectasis and/or scarring is seen in the right middle lobe and
left lung base.

## 2018-11-30 MED ORDER — CHLORHEXIDINE GLUCONATE CLOTH 2 % EX PADS: 6.0000 | MEDICATED_PAD | Freq: Every day | CUTANEOUS | Status: DC

## 2018-11-30 MED ORDER — INSULIN GLARGINE 100 UNIT/ML ~~LOC~~ SOLN
14.0000 [IU] | Freq: Every day | SUBCUTANEOUS | Status: DC
  Administered 2018-11-30 – 2018-12-01 (×2): 14 [IU] via SUBCUTANEOUS
  Filled 2018-11-30 (×3): qty 0.14

## 2018-11-30 NOTE — Progress Notes (Addendum)
Family Medicine Teaching Service Daily Progress Note Intern Pager: [phone]  Patient name: Blake Santana Medical record number: [id number] Date of birth: [birth year] Age: 48 y.o. Gender: male  Primary Care Provider: Mack Hook, MD Consultants: CCM, ID Code Status:   Code Status: Full Code   Pt Overview and Major Events to Date:  Blake Santana is a 48 y.o. male presented with 4 days of SOB and chest pain. PMH is significant for  has a past medical history of AOCD, HFpEF, ESRD on HD, DMT2 HLD, HTN, CAD  4/21 - Admission. Worsening respiratory status. Pulmonary/CCM consulted. Patient intubated. SARS-CoV2 Positive (1 of 2). OETT. R radial arterial line. R IJ CVL placed. Quantiferon Gold +.  4/25 - Extubated.  4/27 - Stable on 3L. BP stable without pressors. Tolerating PO. Transferred back to FPTS 1. ID consulted. 4/28 - Improved respiratory status, only requiring 1L O2.  Remains weak and outpatient HD location is uncertain due to COVID+ and Quant Gold + status  Antibiotic Course Vancomycin (4/21-4/23) Cefepime (4/21-4/24)  Assessment and Plan:  #Acute hypoxic respiratory failure #ARDS #COVID-19 Respiratory status improving.  Status post intubation and extubation. S/p Tocluzimab X 1, Vancomycin and Cefepime.  Desaturated to 80's off of 1 L, so continues to need supplemental O2.  No O2 requirement at home.    Continuous pulse ox, goal sats: 92-96%  Oxygen therapy as needed, wean as tolerated  WOAT  Patient will go to [place] HD location due to COVID+ status, will need to wear a mask at HD and in public  Would like to get patient off of supplemental O2 before discharging home  #Positive QuantiFERON gold.   Sputum AFB from 4/24 is negative x 3.  Dr. Baxter Flattery with ID spoke with health department on 4/28, who said that patient is not infectious and no further workup is needed, other than following his cultures, which are negative.  F/u w/ outpatient dialysis for  receiving HD, Quant Gold result should not affect dialysis location given his negative AFB smear results  #Oral thrush. Likely due to steroid use and critical illness.  Involves the bilateral buccal mucosa. - continue Nystatin rinse four times daily for at least 7 days  #ESRD on HD.  Outpatient schedule TTS.  HD is being done in his room due to his COVID+ status.  Consult nephrology, appreciate recommendations  Daily weights  Strict I's and O's  #DMT2 with neuropathy Hemoglobin A1c 7.1 on admission.  Patient take glipizide at home. Glucose was in upper 100's on 4/28 evening but is 233 on 4/29 am.  Cont to hold glipizide  SSI + TID CBG checks with meals  Increase Lantus to 14 units  #HLD  Continue home rosuvastatin  #HFpEF EF 55 to 55% on 08/2018  Daily weights  Strict I's and O's  Restart metoprolol 25 mg BID  #HTN Patient hypertensive with BP 151/81 on 4/29.  Home medications include amlodipine and metoprolol.  Consult nephrology, appreciate recommendations  Continue amlodipine to 10 mg daily  Continue metoprolol 25 mg BID  #AOCD #Iron deficiency anemia  Hemoglobin stable at 10.2. BL ~11.  Monitor hemoglobin  FEN/GI: Renal/Carb modified PPx: Heparin per pharmacy  Disposition: progressive pending improved respiratory status, organization of outpatient HD due to COVID+ status  Subjective:  Evaluated by attending.   Objective: Temp:  [98 F (36.7 C)-98.2 F (36.8 C)] 98.2 F (36.8 C) (04/29 0008) Pulse Rate:  [72] 72 (04/29 0008) Resp:  [16] 16 (04/29 0008) BP: (151-162)/(81) 151/81 (  04/29 0008) SpO2:  [96 %-98 %] 98 % (04/29 0008) Physical Exam: Evaluated by attending.   Laboratory: Recent Labs  Lab 11/28/18 1016 11/29/18 0627 11/30/18 0641  WBC 9.2 10.7* 11.3*  HGB 10.8* 10.5* 10.2*  HCT 31.5* 31.0* 30.1*  PLT 128* 144* 174   Recent Labs  Lab 11/24/18 0432  11/28/18 0841 11/28/18 1016 11/30/18 0641  NA 134*   < > 131* 132*  132*  K 4.6   < > 4.5 3.1* 4.4  CL 100   < > 93* 95* 89*  CO2 24   < > 21* 23 26  BUN 42*   < > 157* 66* 126*  CREATININE 3.96*   < > 8.83* 3.97* 9.56*  CALCIUM 8.5*   < > 9.5 8.4* 9.5  PROT 6.6  --   --   --   --   BILITOT 0.8  --   --   --   --   ALKPHOS 61  --   --   --   --   ALT 12  --   --   --   --   AST 16  --   --   --   --   GLUCOSE 213*   < > 159* 178* 233*   < > = values in this interval not displayed.      Imaging/Diagnostic Tests: Dg Abd 1 View  Result Date: 11/23/2018 CLINICAL DATA:  OG tube placement. EXAM: ABDOMEN - 1 VIEW COMPARISON:  None. FINDINGS: Enteric tube in the stomach. The bowel gas pattern is normal. No radio-opaque calculi or other significant radiographic abnormality are seen. No acute osseous abnormality. IMPRESSION: 1. Enteric tube in the stomach. Electronically Signed   By: Titus Dubin M.D.   On: 11/23/2018 12:25   Dg Chest Port 1 View  Result Date: 11/23/2018 CLINICAL DATA:  Post intubation, OG tube placement EXAM: PORTABLE CHEST 1 VIEW COMPARISON:  11/22/2018 FINDINGS: Right Vas-Cath, endotracheal tube and NG tube remain in place, unchanged. Bilateral airspace disease again noted, worsening in the right lung since prior study. Heart is borderline in size. No acute bony abnormality. IMPRESSION: Support devices are stable. Bilateral airspace disease, worsening in the right lung since prior study. Electronically Signed   By: Rolm Baptise M.D.   On: 11/23/2018 01:26   Portable Chest X-ray  Result Date: 11/22/2018 CLINICAL DATA:  ET tube, OG tube placement EXAM: PORTABLE CHEST 1 VIEW COMPARISON:  11/22/2018 FINDINGS: Endotracheal tube is 2 cm above the carina. Right Vas-Cath tip is in the SVC. No pneumothorax. OG tube enters the stomach, tip not visualized. Cardiomegaly with bilateral airspace opacities, similar to prior study. Possible small effusions bilaterally. No acute bony abnormality. IMPRESSION: Endotracheal tube 2 cm above the carina. OG tube  is in the stomach. Right Vas-Cath tip in the SVC. Bilateral airspace disease is similar to prior study. Mild cardiomegaly. Electronically Signed   By: Rolm Baptise M.D.   On: 11/22/2018 23:12   Dg Chest Portable 1 View  Result Date: 11/22/2018 CLINICAL DATA:  Fever, chest pain, shortness of breath EXAM: PORTABLE CHEST 1 VIEW COMPARISON:  09/06/2018 FINDINGS: Bilateral airspace disease noted. Heart is borderline in size. No visible effusions. No acute bony abnormality. IMPRESSION: Borderline cardiomegaly with bilateral perihilar and lower lobe airspace opacities. Findings could reflect edema/CHF or infection. Electronically Signed   By: Rolm Baptise M.D.   On: 11/22/2018 03:12    Kathrene Alu, MD 11/30/2018, 7:59 AM PGY-2, [place]  [place] Intern pager: [phone], text pages welcome

## 2018-11-30 NOTE — TOC Progression Note (Signed)
Transition of Care [facility]) - Progression Note    Patient Details  Name: Blake Santana MRN: [id number] Date of Birth: [birth year]  [facility]) CM/SW Contact  Eileen Stanford, LCSW Phone Number: 11/30/2018, 3:23 PM  Clinical Narrative:  CSW utilized interpreter [place] and spoke with pt via telephone. Readmission risk screening completed. Pt was concerned regarding medication prices. Per Pt medical staff said they would know which meds he would need at d/c, tomorrow. CSW will follow up. Pharmacy of choice is [facility] on [facility] [address].     Expected Discharge Plan: Home/Self Care Barriers to Discharge: Continued Medical Work up  Expected Discharge Plan and Services Expected Discharge Plan: Home/Self Care In-house Referral: NA Discharge Planning Services: Medication Assistance, [facility](per pt's wife pt already set up with [facility]. ) Post Acute Care Choice: NA Living arrangements for the past 2 months: Single Family Home Expected Discharge Date: 11/25/18               DME Arranged: N/A DME Agency: NA       HH Arranged: NA           Social Determinants of Health (SDOH) Interventions    Readmission Risk Interventions Readmission Risk Prevention Plan 11/30/2018  Transportation Screening Complete  PCP or Specialist Appt within 3-5 Days Complete  HRI or Home Care Consult Complete  Palliative Care Screening Not Applicable  Medication Review (RN Care Manager) Complete  Some recent data might be hidden

## 2018-11-30 NOTE — Progress Notes (Signed)
Renal Navigator spoke with Dr. Para Skeans regarding patient's Positive QuantiFERON gold. She states that ID/Dr. Baxter Flattery has spoken with Health Department on 4/28, who states patient is not infectious and no further workup is needed, other than following his cultures, which are negative. She reports that patient's Percival Spanish result should not affect treating at OP HD clinic given his negative AFB smear results.  Dr. Shan Levans states plan to wean patient off of O2 today to prepare him for discharge tomorrow. Renal Navigator informed Dr. Shan Levans that patient will treat at [facility] on 3rd shift and will need to report to the clinic at 5:45pm. He will need to remain in his car until the entire clinic vacates and then will be called to enter. Clinic staff will start his treatment as soon as possible once every leaves from 2nd shift treatments. He will treat on a MWF schedule while at [facility].  Renal Navigator spoke with Dr. Schertz/Nephrology to ensure he is aware of above. He reports that patient was not scheduled for inpt HD today in order to get him back on his TTS HD schedule. He will need to have inpt HD tomorrow prior to discharge and then can start at [facility] on Friday.  Renal Navigator has notified OP HD clinics, both [facility] and patient's home clinic [facility] Managers and Director of Operations of patient's discharge plan to assist with smooth transition from hospital to OP HD clinic.   [facility]  [address], [place]

## 2018-11-30 NOTE — Progress Notes (Signed)
Family Medicine Teaching Service Daily Progress Note Intern Pager: [phone]  Patient name: Blake Santana Medical record number: [id number] Date of birth: [birth year] Age: 48 y.o. Gender: male  Primary Care Provider: Mack Hook, MD Consultants: CCM, ID Code Status:   Code Status: Full Code   Pt Overview and Major Events to Date:  4/21 - Admitted to [place]. Worsening respiratory status. Pulmonary/CCM consulted. Patient intubated. SARS-CoV2 Positive (1 of 2). OETT. R radial arterial line. R IJ CVL placed. Quantiferon Gold +.  4/25 - Extubated.  4/27 - Stable on 3L. BP stable without pressors. Tolerating PO. Transferred back to FPTS 1. ID consulted. 4/28 - Improved respiratory status, only requiring 1L O2.  Remains weak and outpatient HD location is uncertain due to COVID+ and Quant Gold + status 4/29: Placed at [place] [facility] center   Antibiotic Course Vancomycin (4/21-4/23) Cefepime (4/21-4/24)  Assessment and Plan: Blake Santana is a 48 y.o. male presented with 4 days of SOB and chest pain. PMH is significant for  has a past medical history of AOCD, HFpEF, ESRD on HD, DMT2 HLD, HTN, CAD  Acute hypoxic respiratory failure  ARDS   COVID-19: Respiratory status improving. On RA overnight without any SOB or desaturations. Per patient, ambulated well this AM without any SOB.  Speaking in full sentences during phone encounter. Status post intubation and extubation. S/p Tocluzimab X 1, Vancomycin and Cefepime.  - Continuous pulse ox, goal sats: 92-96% - Ambulate with pulse ox - Patient will go to [place] HD location due to COVID+ status, will need to wear a mask at HD and in public  #Positive QuantiFERON gold.   Sputum AFB from 4/24 is negative x 1. Unable to obtain remaining two sputum cultures overnight due difficulty obtaining adequate sputum. - Per Dr. Baxter Flattery and ID after speaking with health department on 4/28, other than following up sputum cultures, patient is  not infectious and no further workup is needed - F/u w/ outpatient dialysis for receiving HD, Quant Gold result should not affect dialysis location given his negative AFB smear result - Attempt to obtain remaining 2 sputum cultures  #Oral thrush. Likely due to steroid use and critical illness.  Involves the bilateral buccal mucosa. - continue Nystatin rinse four times daily for at least 7 days  #ESRD on HD.  Outpatient schedule TTS.  HD is being done in his room due to his COVID+ status. HD occurring today. - Nephro following, appreciate recommendations - Daily weights - Strict I's and O's  #DMT2 with neuropathy Hemoglobin A1c 7.1 on admission.  Home meds: Glipizide. CBG's overnight 123-238  - Cont to hold glipizide - SSI + TID CBG checks with meals - Continue Lantus 14 units  #HLD - Continue home rosuvastatin  #HFpEF EF 55 to 55% on 08/2018 - Daily weights - Strict I's and O's - Continue metoprolol 25 mg BID  #HTN BP overnight 142-154 SBP. Home meds include: amlodipine and metoprolol. - Nephrology consulted, appreciate recommendations - Continue amlodipine 10 mg daily - Continue metoprolol 25 mg BID  #AOCD  Iron deficiency anemia - Hemoglobin stable at 9.5 BL ~11. - Monitor hemoglobin  FEN/GI: Renal/Carb modified PPx: Heparin per pharmacy  Disposition: Possible discharge after HD  Subjective:  Patient notes doing well per night.  Denies any chest pain shortness of breath.  Said he was able to walk around in his room go the bathroom, and brush his teeth without any shortness of breath.  Tolerating p.o. Notes he is about to get  HD.  Discussed new HD given he was COVID positive.  Objective: Temp:  [98 F (36.7 C)-98.4 F (36.9 C)] 98.3 F (36.8 C) (04/30 0753) Pulse Rate:  [68-74] 70 (04/30 0900) Resp:  [18] 18 (04/30 0753) BP: (116-172)/(63-88) 134/74 (04/30 0900) SpO2:  [93 %-95 %] 95 % (04/30 0753) Weight:  [63.3 kg] 63.3 kg (04/30 0753) Physical  Exam: Evaluated by attending.   Laboratory: Recent Labs  Lab 11/29/18 0627 11/30/18 0641 12/01/18 0743  WBC 10.7* 11.3* 9.5  HGB 10.5* 10.2* 9.5*  HCT 31.0* 30.1* 27.8*  PLT 144* 174 185   Recent Labs  Lab 11/28/18 0841 11/28/18 1016 11/30/18 0641  NA 131* 132* 132*  K 4.5 3.1* 4.4  CL 93* 95* 89*  CO2 21* 23 26  BUN 157* 66* 126*  CREATININE 8.83* 3.97* 9.56*  CALCIUM 9.5 8.4* 9.5  GLUCOSE 159* 178* 233*      Imaging/Diagnostic Tests: Dg Abd 1 View  Result Date: 11/23/2018 CLINICAL DATA:  OG tube placement. EXAM: ABDOMEN - 1 VIEW COMPARISON:  None. FINDINGS: Enteric tube in the stomach. The bowel gas pattern is normal. No radio-opaque calculi or other significant radiographic abnormality are seen. No acute osseous abnormality. IMPRESSION: 1. Enteric tube in the stomach. Electronically Signed   By: Titus Dubin M.D.   On: 11/23/2018 12:25   Dg Chest Port 1 View  Result Date: 11/23/2018 CLINICAL DATA:  Post intubation, OG tube placement EXAM: PORTABLE CHEST 1 VIEW COMPARISON:  11/22/2018 FINDINGS: Right Vas-Cath, endotracheal tube and NG tube remain in place, unchanged. Bilateral airspace disease again noted, worsening in the right lung since prior study. Heart is borderline in size. No acute bony abnormality. IMPRESSION: Support devices are stable. Bilateral airspace disease, worsening in the right lung since prior study. Electronically Signed   By: Rolm Baptise M.D.   On: 11/23/2018 01:26   Portable Chest X-ray  Result Date: 11/22/2018 CLINICAL DATA:  ET tube, OG tube placement EXAM: PORTABLE CHEST 1 VIEW COMPARISON:  11/22/2018 FINDINGS: Endotracheal tube is 2 cm above the carina. Right Vas-Cath tip is in the SVC. No pneumothorax. OG tube enters the stomach, tip not visualized. Cardiomegaly with bilateral airspace opacities, similar to prior study. Possible small effusions bilaterally. No acute bony abnormality. IMPRESSION: Endotracheal tube 2 cm above the carina. OG  tube is in the stomach. Right Vas-Cath tip in the SVC. Bilateral airspace disease is similar to prior study. Mild cardiomegaly. Electronically Signed   By: Rolm Baptise M.D.   On: 11/22/2018 23:12   Dg Chest Portable 1 View  Result Date: 11/22/2018 CLINICAL DATA:  Fever, chest pain, shortness of breath EXAM: PORTABLE CHEST 1 VIEW COMPARISON:  09/06/2018 FINDINGS: Bilateral airspace disease noted. Heart is borderline in size. No visible effusions. No acute bony abnormality. IMPRESSION: Borderline cardiomegaly with bilateral perihilar and lower lobe airspace opacities. Findings could reflect edema/CHF or infection. Electronically Signed   By: Rolm Baptise M.D.   On: 11/22/2018 03:12    Danna Hefty, DO 12/01/2018, 9:04 AM PGY-1, [place] Intern pager: [phone], text pages welcome

## 2018-11-30 NOTE — Progress Notes (Signed)
Patient ID: Blake Santana, male   DOB: June 08, 1971, 48 y.o.   MRN: [id number] S: no new updates  O:BP (!) 154/78 (BP Location: Right Arm)   Pulse 80   Temp 97.7 F (36.5 C) (Oral)   Resp 16   Ht 5\' 2"  (1.575 m)   Wt 60.1 kg   SpO2 94%   BMI 24.23 kg/m   Intake/Output Summary (Last 24 hours) at 11/30/2018 1234 Last data filed at 11/30/2018 1000 Gross per 24 hour  Intake 360 ml  Output 1 ml  Net 359 ml   Intake/Output: I/O last 3 completed shifts: In: 120 [P.O.:120] Out: 1 [Stool:1]  Intake/Output this shift:  Total I/O In: 240 [P.O.:240] Out: -  Weight change:   Patient not examined directly given COVID-19 + status  Home meds: - amlodipine 5 qd/ metoprolol 25 bid - rosuvastatin 20 / aspirin 81 - gabapentin 400 TTS - glipizide 5 bid  - prn's/ vitamins/ supplements  [place] TTS 3h 40min   64.5kg   2/2 bath  Hep 2000  AVG  Hectorol 2 mcg IV 3x week during HD Venofer 50 mg IV q week Velphoro 500 mg 2 tabs PO TID with meals   Assessment/ Plan:  1. Covid-19 infection with sepsis- improved 2. ESRD- sp CRRT 4/21- 4/24. Plan HD tomorrow am and then should be dc'd home Thursday to start OP HD on Friday (MWF). He will be dialyzed at [facility] on 3rd shift which is for COVID + dialysis patients only. 3. Volume - 4kg under prior dry, no ^vol by my exam on 4/27 4. Anemia:Hgb up to 10.5 5. CKD-MBD:stable 6. Nutrition:npo for now 7. Hypertension: on home meds norvasc/ metop 8. DM- per primary 9. Quantiferon-TB Gold positive- latent vs active infection question, ID consult pending  [facility] 11/30/2018, 12:34 PM     Recent Labs  Lab 11/24/18 0432  11/25/18 1600 11/26/18 0310 11/26/18 0313 11/26/18 1523 11/27/18 0319 11/28/18 0841 11/28/18 1016 11/30/18 0641  NA 134*   < > 136 137 135 135 135 131* 132* 132*  K 4.6   < > 3.9 3.1* 3.0* 4.3 4.6 4.5 3.1* 4.4  CL 100   < > 99  --  98 97* 97* 93* 95* 89*  CO2 24   < > 26  --  24 23 24   21* 23 26  GLUCOSE 213*   < > 291*  --  284* 324* 133* 159* 178* 233*  BUN 42*   < > 44*  --  48* 94* 116* 157* 66* 126*  CREATININE 3.96*   < > 2.12*  --  1.91* 3.24* 4.87* 8.83* 3.97* 9.56*  ALBUMIN 2.6*   < > 3.0*  --  3.0* 3.1* 3.0* 2.9* 3.0* 3.2*  CALCIUM 8.5*   < > 9.0  --  9.0 9.2 9.5 9.5 8.4* 9.5  PHOS 3.7   < > 2.6  --  2.2* 3.3 3.2 3.7 1.6* 5.2*  AST 16  --   --   --   --   --   --   --   --   --   ALT 12  --   --   --   --   --   --   --   --   --    < > = values in this interval not displayed.   Liver Function Tests: Recent Labs  Lab 11/24/18 0432  11/28/18 0841 11/28/18 1016 11/30/18 0641  AST  16  --   --   --   --   ALT 12  --   --   --   --   ALKPHOS 61  --   --   --   --   BILITOT 0.8  --   --   --   --   PROT 6.6  --   --   --   --   ALBUMIN 2.6*   < > 2.9* 3.0* 3.2*   < > = values in this interval not displayed.   No results for input(s): LIPASE, AMYLASE in the last 168 hours. No results for input(s): AMMONIA in the last 168 hours. CBC: Recent Labs  Lab 11/27/18 0319 11/28/18 0641 11/28/18 1016 11/29/18 0627 11/30/18 0641  WBC 11.9* 11.5* 9.2 10.7* 11.3*  HGB 10.9* 10.6* 10.8* 10.5* 10.2*  HCT 33.6* 31.4* 31.5* 31.0* 30.1*  MCV 94.1 92.1 89.2 90.4 89.3  PLT 125* 136* 128* 144* 174   Cardiac Enzymes: Recent Labs  Lab 11/23/18 1711 11/28/18 1016  TROPONINI 0.15* 0.07*   CBG: Recent Labs  Lab 11/29/18 0945 11/29/18 1306 11/29/18 1733 11/29/18 2151 11/30/18 0932  GLUCAP 217* 212* 197* 167* 238*    Iron Studies: No results for input(s): IRON, TIBC, TRANSFERRIN, FERRITIN in the last 72 hours. Studies/Results: No results found. Marland Kitchen amLODipine  10 mg Oral Daily  . aspirin EC  81 mg Oral Daily  . feeding supplement (NEPRO CARB STEADY)  237 mL Oral BID BM  . gabapentin  300 mg Oral Q T,Th,Sa-HD  . heparin injection (subcutaneous)  5,000 Units Subcutaneous Q8H  . insulin aspart  0-9 Units Subcutaneous TID WC  . insulin glargine  14 Units  Subcutaneous Daily  . metoprolol tartrate  25 mg Oral BID WC  . multivitamin  1 tablet Oral QHS  . nystatin  5 mL Mouth/Throat QID  . rosuvastatin  20 mg Oral Q24H  . senna-docusate  1 tablet Oral BID  . sucroferric oxyhydroxide  1,000 mg Oral TID WC    BMET    Component Value Date/Time   NA 132 (L) 11/30/2018 0641   NA 144 08/26/2018 0951   K 4.4 11/30/2018 0641   CL 89 (L) 11/30/2018 0641   CO2 26 11/30/2018 0641   GLUCOSE 233 (H) 11/30/2018 0641   BUN 126 (H) 11/30/2018 0641   BUN 28 (H) 08/26/2018 0951   CREATININE 9.56 (H) 11/30/2018 0641   CALCIUM 9.5 11/30/2018 0641   GFRNONAA 6 (L) 11/30/2018 0641   GFRAA 7 (L) 11/30/2018 0641   CBC    Component Value Date/Time   WBC 11.3 (H) 11/30/2018 0641   RBC 3.37 (L) 11/30/2018 0641   HGB 10.2 (L) 11/30/2018 0641   HGB 8.4 (L) 01/14/2017 1046   HCT 30.1 (L) 11/30/2018 0641   HCT 26.0 (L) 01/14/2017 1046   PLT 174 11/30/2018 0641   PLT 236 01/14/2017 1046   MCV 89.3 11/30/2018 0641   MCV 88 01/14/2017 1046   MCH 30.3 11/30/2018 0641   MCHC 33.9 11/30/2018 0641   RDW 13.8 11/30/2018 0641   RDW 14.1 01/14/2017 1046   LYMPHSABS 1.3 11/23/2018 0530   LYMPHSABS 1.8 01/14/2017 1046   MONOABS 0.3 11/23/2018 0530   EOSABS 0.1 11/23/2018 0530   EOSABS 0.7 (H) 01/14/2017 1046   BASOSABS 0.0 11/23/2018 0530   BASOSABS 0.0 01/14/2017 1046

## 2018-11-30 NOTE — Progress Notes (Signed)
Physical Therapy Treatment Patient Details Name: Blake Santana MRN: [id number] DOB: Jul 06, 1971 Today's Date: 11/30/2018    History of Present Illness Blake Santana is a 48 y.o. male presented with 4 days of SOB and chest pain. PMH is significant for  has a past medical history of AOCD, HFpEF, ESRD on HD, DMT2 HLD, HTN, CAD. Tested covid + on 4/21.    PT Comments    Patient ambulating 52' today with mild desat to 88%, returns within seconds to 95% with rest, no dyspnea noted.  Reports he feels weak. Supervision level for gait.      Follow Up Recommendations  No PT follow up     Equipment Recommendations  None recommended by PT    Recommendations for Other Services       Precautions / Restrictions Precautions Precautions: Other (comment) Precaution Comments: COVID +, TB +. Air/Con pre Restrictions Weight Bearing Restrictions: No    Mobility  Bed Mobility Overal bed mobility: Modified Independent             General bed mobility comments: Increased time  Transfers Overall transfer level: Needs assistance Equipment used: None Transfers: Sit to/from Stand Sit to Stand: Supervision;Min guard         General transfer comment: Supervision-Min Guard A for safety  Ambulation/Gait Ambulation/Gait assistance: Min guard;Min assist Gait Distance (Feet): 75 Feet Assistive device: None Gait Pattern/deviations: Staggering right;Staggering left Gait velocity: decreased   General Gait Details: supervision level for ambulation, desat to 88% on RA with 75' quickly returned to 94% on RA   Stairs             Wheelchair Mobility    Modified Rankin (Stroke Patients Only)       Balance Overall balance assessment: Needs assistance Sitting-balance support: No upper extremity supported;Feet supported Sitting balance-Leahy Scale: Good     Standing balance support: No upper extremity supported;During functional activity Standing balance-Leahy Scale:  Fair Standing balance comment: Requiring increased assistance with fatigue                            Cognition Arousal/Alertness: Awake/alert Behavior During Therapy: WFL for tasks assessed/performed Overall Cognitive Status: Within Functional Limits for tasks assessed                                 General Comments: Pt following cues and attending to ADLs. Awareness of deconditioning and functional limited      Exercises      General Comments        Pertinent Vitals/Pain Faces Pain Scale: Hurts a little bit Pain Location: around his mouth Pain Descriptors / Indicators: Discomfort    Home Living                      Prior Function            PT Goals (current goals can now be found in the care plan section) Acute Rehab PT Goals Patient Stated Goal: "Go home" PT Goal Formulation: With patient Time For Goal Achievement: 12/13/18 Potential to Achieve Goals: Good    Frequency    Min 3X/week      PT Plan      Co-evaluation PT/OT/SLP Co-Evaluation/Treatment: Yes            AM-PAC PT "6 Clicks" Mobility   Outcome Measure  Help needed turning from  your back to your side while in a flat bed without using bedrails?: None Help needed moving from lying on your back to sitting on the side of a flat bed without using bedrails?: None Help needed moving to and from a bed to a chair (including a wheelchair)?: None Help needed standing up from a chair using your arms (e.g., wheelchair or bedside chair)?: None Help needed to walk in hospital room?: A Little Help needed climbing 3-5 steps with a railing? : A Little 6 Click Score: 22    End of Session Equipment Utilized During Treatment: Gait belt Activity Tolerance: Patient tolerated treatment well Patient left: in chair;with call bell/phone within reach;with chair alarm set Nurse Communication: Mobility status PT Visit Diagnosis: Unsteadiness on feet (R26.81);Muscle weakness  (generalized) (M62.81)     Time: 9292-4462 PT Time Calculation (min) (ACUTE ONLY): 25 min  Charges:  $Gait Training: 8-22 mins $Therapeutic Activity: 8-22 mins                     Reinaldo Berber, PT, DPT Acute Rehabilitation Services Pager: [phone] Office: [phone]     Reinaldo Berber 11/30/2018, 10:14 AM

## 2018-12-01 LAB — CBC
HCT: 27.8 % — ABNORMAL LOW (ref 39.0–52.0)
Hemoglobin: 9.5 g/dL — ABNORMAL LOW (ref 13.0–17.0)
MCH: 30.5 pg (ref 26.0–34.0)
MCHC: 34.2 g/dL (ref 30.0–36.0)
MCV: 89.4 fL (ref 80.0–100.0)
Platelets: 185 10*3/uL (ref 150–400)
RBC: 3.11 MIL/uL — ABNORMAL LOW (ref 4.22–5.81)
RDW: 13.9 % (ref 11.5–15.5)
WBC: 9.5 10*3/uL (ref 4.0–10.5)
nRBC: 0 % (ref 0.0–0.2)

## 2018-12-01 LAB — RENAL FUNCTION PANEL
Albumin: 3.2 g/dL — ABNORMAL LOW (ref 3.5–5.0)
Anion gap: 18 — ABNORMAL HIGH (ref 5–15)
BUN: 156 mg/dL — ABNORMAL HIGH (ref 6–20)
CO2: 26 mmol/L (ref 22–32)
Calcium: 9.1 mg/dL (ref 8.9–10.3)
Chloride: 86 mmol/L — ABNORMAL LOW (ref 98–111)
Creatinine, Ser: 12.33 mg/dL — ABNORMAL HIGH (ref 0.61–1.24)
GFR calc Af Amer: 5 mL/min — ABNORMAL LOW (ref >60)
GFR calc non Af Amer: 4 mL/min — ABNORMAL LOW (ref >60)
Glucose, Bld: 138 mg/dL — ABNORMAL HIGH (ref 70–99)
Phosphorus: 5.8 mg/dL — ABNORMAL HIGH (ref 2.5–4.6)
Potassium: 4.3 mmol/L (ref 3.5–5.1)
Sodium: 130 mmol/L — ABNORMAL LOW (ref 135–145)

## 2018-12-01 LAB — GLUCOSE, CAPILLARY
Glucose-Capillary: 166 mg/dL — ABNORMAL HIGH (ref 70–99)
Glucose-Capillary: 200 mg/dL — ABNORMAL HIGH (ref 70–99)

## 2018-12-01 MED ORDER — CHLORHEXIDINE GLUCONATE CLOTH 2 % EX PADS: 6.0000 | MEDICATED_PAD | Freq: Every day | CUTANEOUS | Status: DC

## 2018-12-01 MED ORDER — NYSTATIN 100000 UNIT/ML MT SUSP: 5.0000 mL | Freq: Four times a day (QID) | OROMUCOSAL | 0 refills | Status: AC

## 2018-12-01 MED ORDER — SUCROFERRIC OXYHYDROXIDE 500 MG PO CHEW: 1000.0000 mg | CHEWABLE_TABLET | Freq: Three times a day (TID) | ORAL | 0 refills | Status: DC

## 2018-12-01 MED ORDER — HEPARIN SODIUM (PORCINE) 1000 UNIT/ML IJ SOLN
INTRAMUSCULAR | Status: AC
  Administered 2018-12-01: 2000 [IU] via INTRAVENOUS_CENTRAL
  Filled 2018-12-01: qty 1

## 2018-12-01 MED ORDER — AMLODIPINE BESYLATE 10 MG PO TABS: 10.0000 mg | ORAL_TABLET | Freq: Every day | ORAL | 0 refills | Status: DC

## 2018-12-01 MED ORDER — HEPARIN SODIUM (PORCINE) 1000 UNIT/ML DIALYSIS
2000.0000 [IU] | Freq: Once | INTRAMUSCULAR | Status: AC
  Administered 2018-12-01: 08:00:00 2000 [IU] via INTRAVENOUS_CENTRAL

## 2018-12-01 MED FILL — AMLODIPINE BESYLATE 10 MG T: 10 | 30 days supply | Qty: 30 | Fill #0

## 2018-12-01 MED FILL — NYSTATIN 100,000 UNITS/ML S: 100000 | 7 days supply | Qty: 60 | Fill #0

## 2018-12-01 NOTE — Progress Notes (Signed)
Renal Navigator notified OP HD clinic/[place] clinic of patient discharge home on O2 today to assist with smooth transition from hospital to OP HD clinic.  Alphonzo Cruise Renal Navigator [phone]

## 2018-12-01 NOTE — TOC Transition Note (Addendum)
Transition of Care [facility]) - CM/SW Discharge Note   Patient Details  Name: Blake Santana MRN: [id number] Date of Birth: March 29, 1971  Transition of Care [facility]) CM/SW Contact:  Zenon Mayo, RN Phone Number: 12/01/2018, 3:49 PM   Clinical Narrative:     Patient for discharge today, will have home oxygen with [employer], they will bring up to patient's room prior to dc.  TOC pharmacy is filling medications for patient, Match letter done to assist with medication prices.   Final next level of care: Home/Self Care Barriers to Discharge: No Barriers Identified   Patient Goals and CMS Choice Patient states their goals for this hospitalization and ongoing recovery are:: " to afford meds" CMS Medicare.gov Compare Post Acute Care list provided to:: Other (Comment Required)(not at time as dispositon needs still being determined. ) Choice offered to / list presented to : NA  Discharge Placement                       Discharge Plan and Services In-house Referral: NA Discharge Planning Services: CM Consult Post Acute Care Choice: Durable Medical Equipment          DME Arranged: Oxygen DME Agency: [employer] Date DME Agency Contacted: 12/01/18 Time DME Agency Contacted: [phone] Representative spoke with at DME Agency: zack HH Arranged: NA          Social Determinants of Health ([place]) Interventions     Readmission Risk Interventions Readmission Risk Prevention Plan 12/01/2018 11/30/2018  Transportation Screening - Complete  PCP or Specialist Appt within 3-5 Days Complete Complete  HRI or [place] - Complete  Palliative Care Screening - Not Applicable  Medication Review (RN Care Manager) - Complete  Some recent data might be hidden

## 2018-12-01 NOTE — Discharge Instructions (Signed)
You were admitted to [facility] for fevers, chills, cough, and shortness of breath and found to have COVID 19.  You initially required ICU medical care however you recovered very well.  You were also found to have a positive TB test.  We have spoken to infectious disease and they health department and they recommend wearing a mask in public and at hemodialysis.  You had labs completed to further test for TB, and we will continue to follow those labs.  It will be important for you to follow-up with your PCP.  We have also found at hemodialysis center that will be safe for you to attend after recovering from [place] 19.  The address is listed below.  Your next hemodialysis is scheduled for May 1st. Thank you so much for allowing [place] to take care of you.  Hemodialysis Center: [facility] [address] [place], [place] [address] [phone]

## 2018-12-01 NOTE — Progress Notes (Signed)
SATURATION QUALIFICATIONS: (This note is used to comply with regulatory documentation for home oxygen)  Patient Saturations on Room Air at Rest = 88%  Patient Saturations on Room Air while Ambulating = 84%  Patient Saturations on 2 Liters of oxygen while Ambulating = 94%  Please briefly explain why patient needs home oxygen: patient desaturates on room air

## 2018-12-01 NOTE — Progress Notes (Addendum)
Patient ID: Blake Santana, male   DOB: 12/16/1970, 48 y.o.   MRN: [id number] S: no new updates  O:BP (!) 159/79 (BP Location: Right Arm)   Pulse 73   Temp 98.1 F (36.7 C) (Oral)   Resp 18   Ht 5\' 2"  (1.575 m)   Wt 63.3 kg   SpO2 94%   BMI 25.52 kg/m   Intake/Output Summary (Last 24 hours) at 12/01/2018 1511 Last data filed at 12/01/2018 1105 Gross per 24 hour  Intake 120 ml  Output 1000 ml  Net -880 ml   Intake/Output: I/O last 3 completed shifts: In: 70 [P.O.:960] Out: 1 [Stool:1]  Intake/Output this shift:  Total I/O In: -  Out: 1000 [Other:1000] Weight change:   Patient not examined directly given COVID-19 + status  Home meds: - amlodipine 5 qd/ metoprolol 25 bid - rosuvastatin 20 / aspirin 81 - gabapentin 400 TTS - glipizide 5 bid  - prn's/ vitamins/ supplements  [place] TTS 3h 26min   64.5kg   2/2 bath  Hep 2000  AVG  Hectorol 2 mcg IV 3x week during HD Venofer 50 mg IV q week Velphoro 500 mg 2 tabs PO TID with meals   Assessment/ Plan:  1. Covid-19 infection with sepsis- improved 2. ESRD- sp CRRT 4/21- 4/24. Plan HD today and dc home if stable from primary team standpoint. Is set up for HD tomorrow night (5/1) on MWF schedule at [facility] 3rd shift.  3. Volume - stable , under dry wt, UF to 60- 61 kg as new drywt 4. Anemia:Hgb up to 10.5 5. Hypertension: on home meds norvasc/ metop 6. DM- per primary 7. Quantiferon-TB Gold positive- ok for OP HD per ID w/ negative smear(s)  [facility] 12/01/2018, 3:11 PM     Recent Labs  Lab 11/26/18 0313 11/26/18 1523 11/27/18 0319 11/28/18 0841 11/28/18 1016 11/30/18 0641 12/01/18 0744  NA 135 135 135 131* 132* 132* 130*  K 3.0* 4.3 4.6 4.5 3.1* 4.4 4.3  CL 98 97* 97* 93* 95* 89* 86*  CO2 24 23 24  21* 23 26 26   GLUCOSE 284* 324* 133* 159* 178* 233* 138*  BUN 48* 94* 116* 157* 66* 126* 156*  CREATININE 1.91* 3.24* 4.87* 8.83* 3.97* 9.56* 12.33*  ALBUMIN 3.0* 3.1* 3.0*  2.9* 3.0* 3.2* 3.2*  CALCIUM 9.0 9.2 9.5 9.5 8.4* 9.5 9.1  PHOS 2.2* 3.3 3.2 3.7 1.6* 5.2* 5.8*   Liver Function Tests: Recent Labs  Lab 11/28/18 1016 11/30/18 0641 12/01/18 0744  ALBUMIN 3.0* 3.2* 3.2*   No results for input(s): LIPASE, AMYLASE in the last 168 hours. No results for input(s): AMMONIA in the last 168 hours. CBC: Recent Labs  Lab 11/28/18 0641 11/28/18 1016 11/29/18 0627 11/30/18 0641 12/01/18 0743  WBC 11.5* 9.2 10.7* 11.3* 9.5  HGB 10.6* 10.8* 10.5* 10.2* 9.5*  HCT 31.4* 31.5* 31.0* 30.1* 27.8*  MCV 92.1 89.2 90.4 89.3 89.4  PLT 136* 128* 144* 174 185   Cardiac Enzymes: Recent Labs  Lab 11/28/18 1016  TROPONINI 0.07*   CBG: Recent Labs  Lab 11/30/18 1255 11/30/18 1911 11/30/18 2143 12/01/18 0647 12/01/18 1136  GLUCAP 238* 171* 123* 166* 200*    Iron Studies: No results for input(s): IRON, TIBC, TRANSFERRIN, FERRITIN in the last 72 hours. Studies/Results: No results found. Marland Kitchen amLODipine  10 mg Oral Daily  . aspirin EC  81 mg Oral Daily  . Chlorhexidine Gluconate Cloth  6 each Topical Q0600  . feeding supplement (  NEPRO CARB STEADY)  237 mL Oral BID BM  . gabapentin  300 mg Oral Q T,Th,Sa-HD  . heparin injection (subcutaneous)  5,000 Units Subcutaneous Q8H  . insulin aspart  0-9 Units Subcutaneous TID WC  . insulin glargine  14 Units Subcutaneous Daily  . metoprolol tartrate  25 mg Oral BID WC  . multivitamin  1 tablet Oral QHS  . nystatin  5 mL Mouth/Throat QID  . rosuvastatin  20 mg Oral Q24H  . senna-docusate  1 tablet Oral BID  . sucroferric oxyhydroxide  1,000 mg Oral TID WC    BMET    Component Value Date/Time   NA 130 (L) 12/01/2018 0744   NA 144 08/26/2018 0951   K 4.3 12/01/2018 0744   CL 86 (L) 12/01/2018 0744   CO2 26 12/01/2018 0744   GLUCOSE 138 (H) 12/01/2018 0744   BUN 156 (H) 12/01/2018 0744   BUN 28 (H) 08/26/2018 0951   CREATININE 12.33 (H) 12/01/2018 0744   CALCIUM 9.1 12/01/2018 0744   GFRNONAA 4 (L)  12/01/2018 0744   GFRAA 5 (L) 12/01/2018 0744   CBC    Component Value Date/Time   WBC 9.5 12/01/2018 0743   RBC 3.11 (L) 12/01/2018 0743   HGB 9.5 (L) 12/01/2018 0743   HGB 8.4 (L) 01/14/2017 1046   HCT 27.8 (L) 12/01/2018 0743   HCT 26.0 (L) 01/14/2017 1046   PLT 185 12/01/2018 0743   PLT 236 01/14/2017 1046   MCV 89.4 12/01/2018 0743   MCV 88 01/14/2017 1046   MCH 30.5 12/01/2018 0743   MCHC 34.2 12/01/2018 0743   RDW 13.9 12/01/2018 0743   RDW 14.1 01/14/2017 1046   LYMPHSABS 1.3 11/23/2018 0530   LYMPHSABS 1.8 01/14/2017 1046   MONOABS 0.3 11/23/2018 0530   EOSABS 0.1 11/23/2018 0530   EOSABS 0.7 (H) 01/14/2017 1046   BASOSABS 0.0 11/23/2018 0530   BASOSABS 0.0 01/14/2017 1046

## 2018-12-01 NOTE — Progress Notes (Addendum)
Patient called out to nurses station, RN went to check on patient and he stated that he felt SOB. Checked O2 was 88% on room air, applied 2L [place] and recovered to 94%.  RN paged doctor.   1440: Dr. Annie Main page stated not to discharge him yet and try weaning him back down to room air as tolerated.

## 2018-12-06 ENCOUNTER — Encounter: Payer: Self-pay | Admitting: Internal Medicine

## 2018-12-07 ENCOUNTER — Inpatient Hospital Stay: Payer: Self-pay | Admitting: Primary Care

## 2018-12-08 ENCOUNTER — Encounter: Payer: Self-pay | Admitting: Primary Care

## 2018-12-08 ENCOUNTER — Other Ambulatory Visit: Payer: Self-pay

## 2018-12-08 ENCOUNTER — Ambulatory Visit: Payer: Self-pay | Attending: Primary Care | Admitting: Primary Care

## 2018-12-08 DIAGNOSIS — U071 COVID-19: Secondary | ICD-10-CM

## 2018-12-08 DIAGNOSIS — R05 Cough: Secondary | ICD-10-CM

## 2018-12-08 DIAGNOSIS — Z09 Encounter for follow-up examination after completed treatment for conditions other than malignant neoplasm: Secondary | ICD-10-CM

## 2018-12-08 DIAGNOSIS — J029 Acute pharyngitis, unspecified: Secondary | ICD-10-CM

## 2018-12-08 DIAGNOSIS — J9601 Acute respiratory failure with hypoxia: Secondary | ICD-10-CM

## 2018-12-08 NOTE — Progress Notes (Signed)
Patient verified DOB Patient has taken medication and has eaten today. Patient complains of pain in his gums and tongue and mouth. Pain is scaled at a 4/5. Pain is described as burning. Cough with sore throat from receiving O2 in the hospital. CBG: 132

## 2018-12-08 NOTE — Progress Notes (Signed)
Virtual Visit via Telephone Note  I connected with Blake Santana on 12/08/18 at 10:30 AM EDT by telephone and verified that I am speaking with the correct person using two identifiers.   I discussed the limitations, risks, security and privacy concerns of performing an evaluation and management service by telephone and the availability of in person appointments. I also discussed with the patient that there may be a patient responsible charge related to this service. The patient expressed understanding and agreed to proceed.   History of Present Illness:  Blake Santana is a 48 y.o. male presented to the ED for  4 days of SOB and chest pain. PMH is significant for  has a past medical history of AOCD, HFpEF, ESRD on HD, DMT2 HLD, HTN, CAD. Due to language barrier, an interpreter was on the telephone call.  He has a primary care doctor Dr. Amil Amen which he wants to cont seeing. This is only a ED f/u.  Observations/Objective: BP (!) 199/93   Pulse 92   Temp 99.4 F (37.4 C) (Rectal)   Resp (!) 21   Ht 5\' 2"  (1.575 m)   Wt 65.3 kg Comment: from Jan 2020 records  SpO2 97%   BMI 26.34 kg/m  Review of Systems  Constitutional: Negative.   HENT: Positive for sore throat.   Eyes: Negative.   Respiratory: Positive for cough.   Cardiovascular: Negative.   Gastrointestinal: Negative.   Genitourinary: Negative.   Musculoskeletal: Negative.   Skin: Negative.   Neurological: Negative.   Psychiatric/Behavioral: Negative.     Assessment and Plan: Hospital discharge follow-up  Blake Santana was seen today for hospitalization follow-up. Acute hypoxemic respiratory failure secondary to COVID19 Blake Santana went to ED after multiple days of fever, chills, cough, shortness of breath. He was admitted  desaturating  to 80s requiring 4 L oxygen on admission .  Chest x-ray on admission showing bilateral opacities concerning for pneumonia. On 4/27, ID was consulted to help organize his outpatient HD  location due to his COVID+ and QuantGold+ status.    Follow up with PCP       Follow Up Instructions:    I discussed the assessment and treatment plan with the patient. The patient was provided an opportunity to ask questions and all were answered. The patient agreed with the plan and demonstrated an understanding of the instructions.   The patient was advised to call back or seek an in-person evaluation if the symptoms worsen or if the condition fails to improve as anticipated.  I provided 15 minutes of non-face-to-face time during this encounter. Reviewed hospital records   Kerin Perna, NP

## 2018-12-17 ENCOUNTER — Other Ambulatory Visit: Payer: Self-pay | Admitting: Internal Medicine

## 2018-12-26 LAB — FUNGUS CULTURE RESULT

## 2018-12-26 LAB — FUNGUS CULTURE WITH STAIN

## 2018-12-26 LAB — FUNGAL ORGANISM REFLEX

## 2018-12-29 ENCOUNTER — Telehealth: Payer: Self-pay | Admitting: *Deleted

## 2018-12-29 NOTE — Telephone Encounter (Signed)
I attempted to call in regards to donating his plasma since he is recovered from the COVID-19.   The line disconnects when the answering machine picks up. Unable to contact him or leave message.

## 2019-01-10 LAB — ACID FAST CULTURE WITH REFLEXED SENSITIVITIES (MYCOBACTERIA): Acid Fast Culture: NEGATIVE

## 2019-03-07 IMAGING — US US ABDOMEN COMPLETE
1 series · 14 of 25 positions shown · non-contrast
Comparison: None available

CLINICAL DATA: Acute renal failure

EXAM:
ABDOMEN ULTRASOUND COMPLETE

[Series 1: us abdomen complete · 0.19mm/px · 14 of 67 slices shown]
[im 1/67]
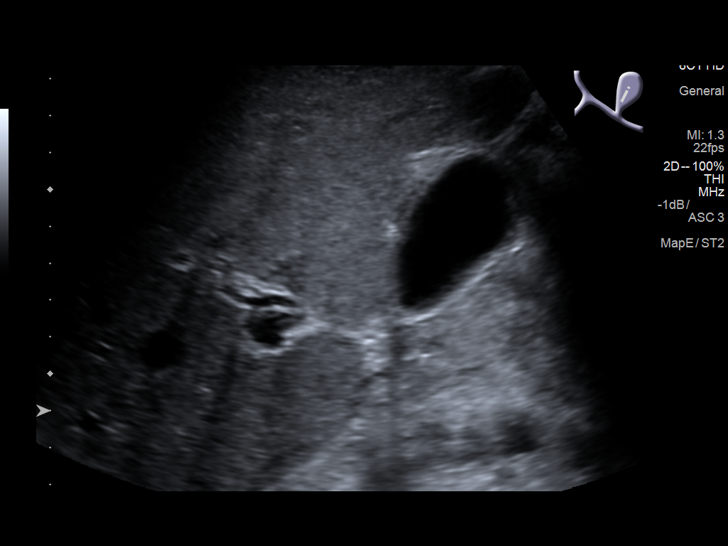
[im 6/67]
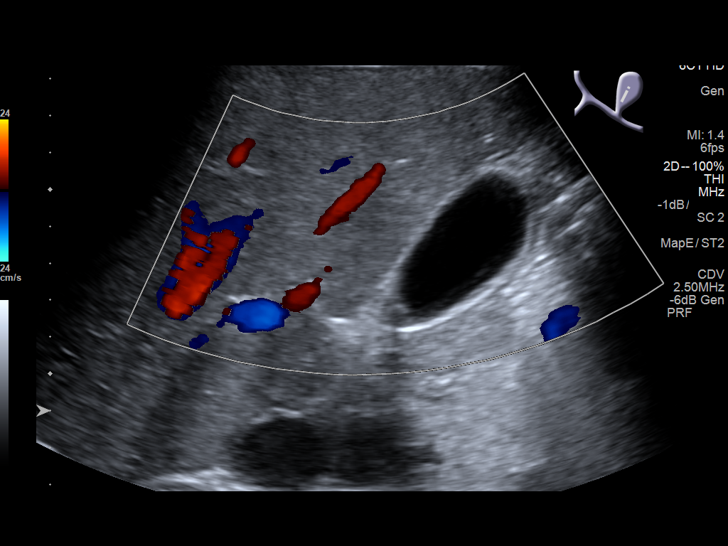
[im 12/67]
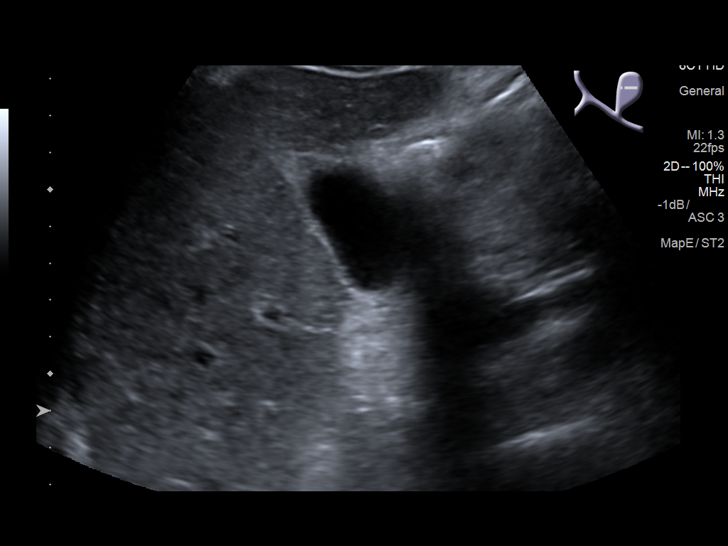
[im 17/67]
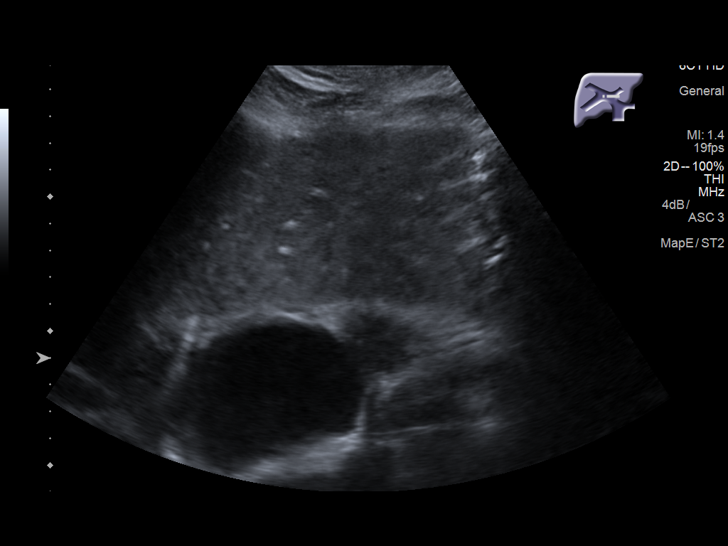
[im 23/67]
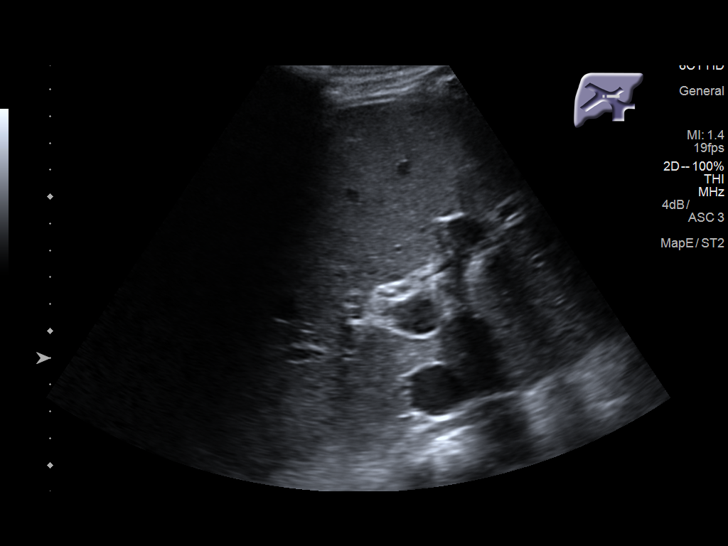
[im 25/67]
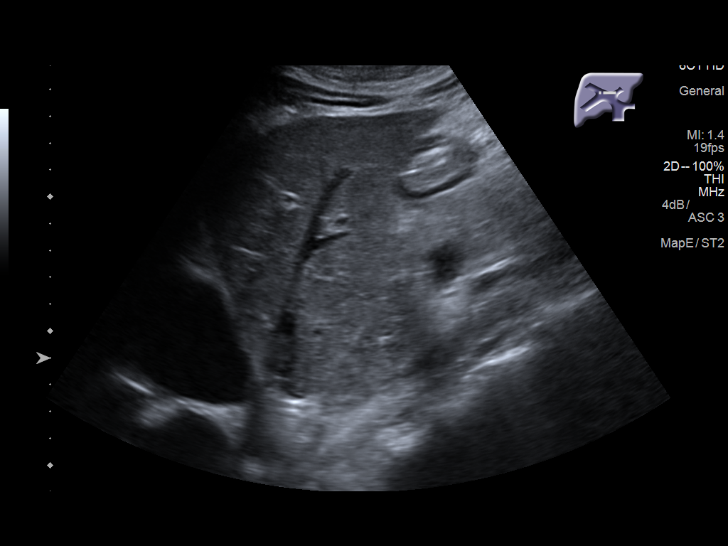
[im 31/67]
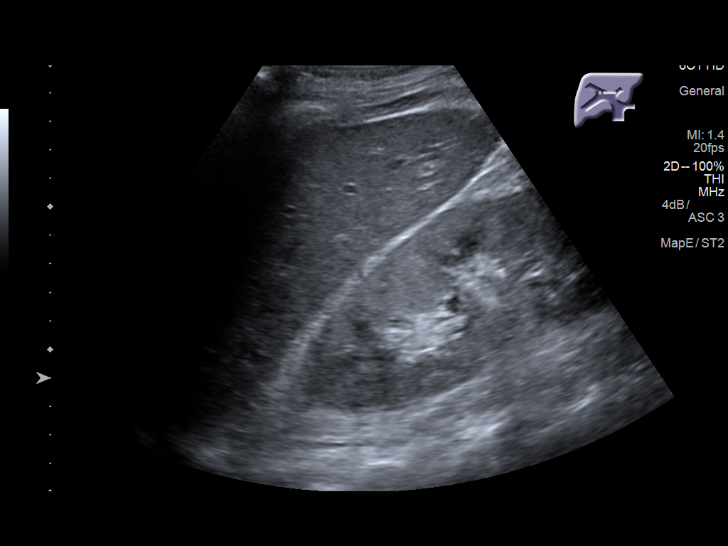
[im 36/67]
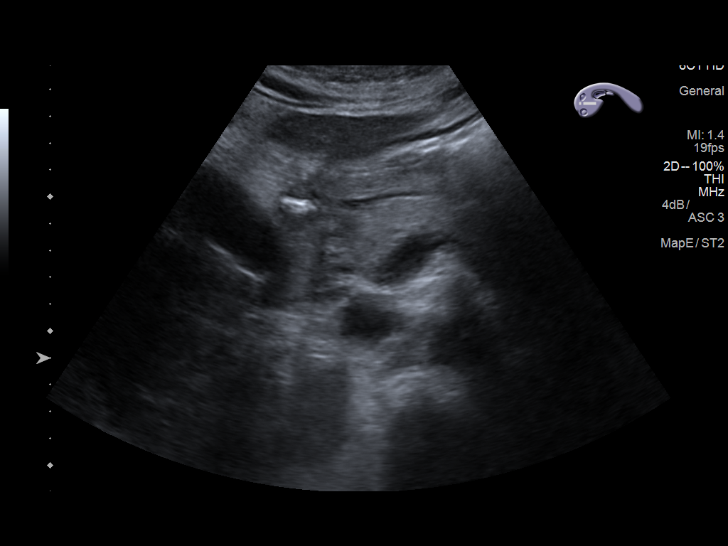
[im 42/67]
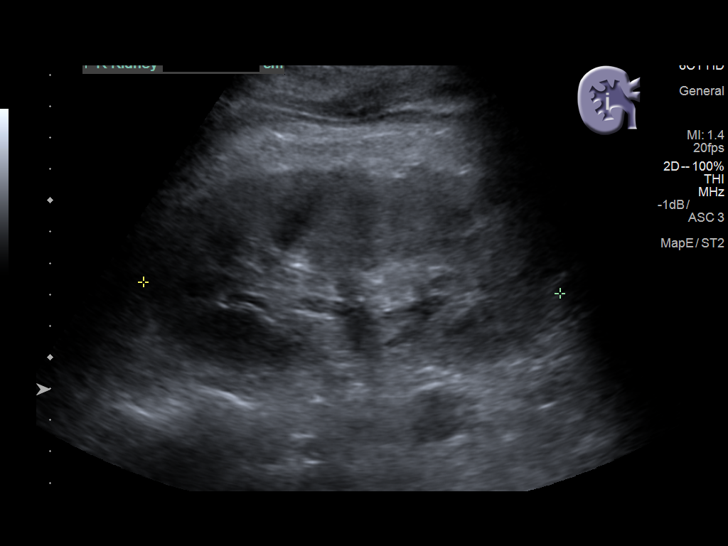
[im 45/67]
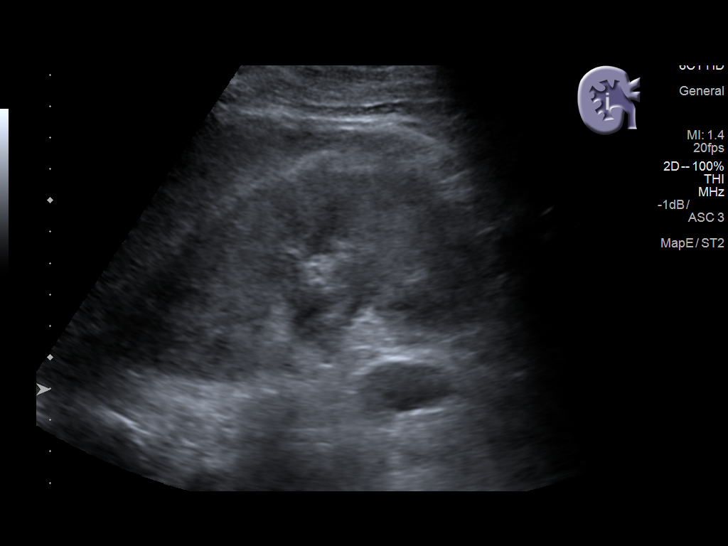
[im 50/67]
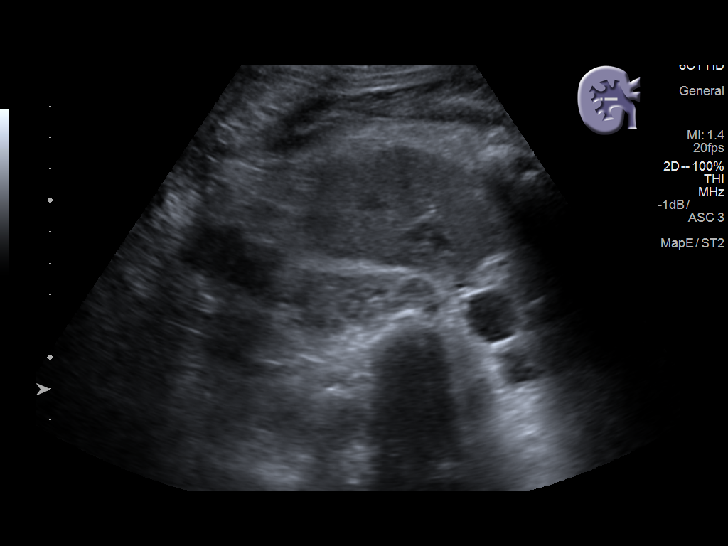
[im 56/67]
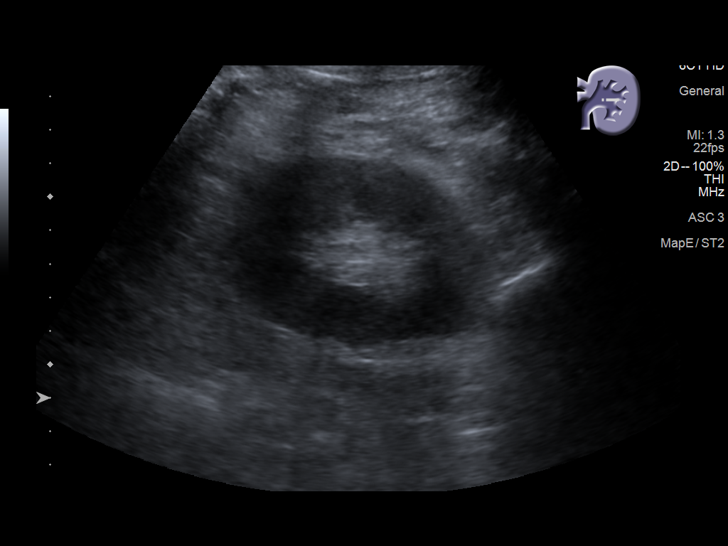
[im 61/67]
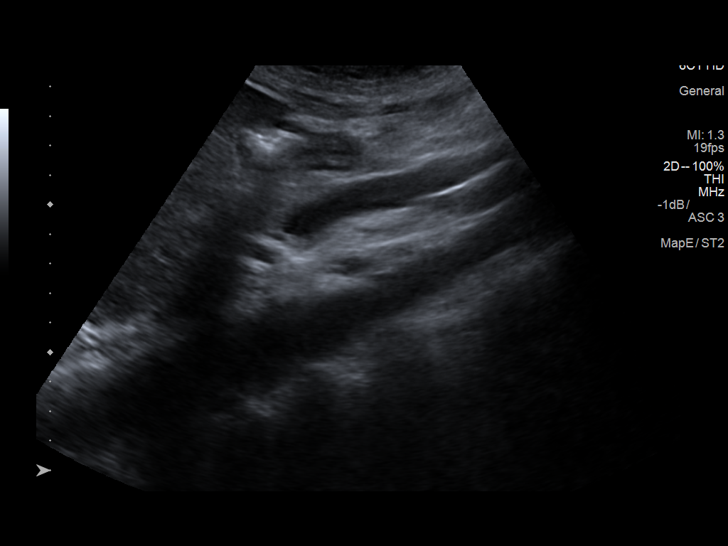
[im 67/67]
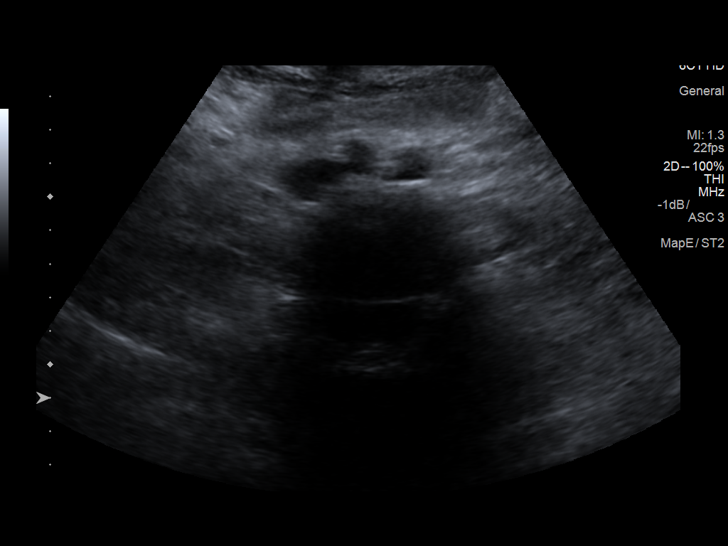

[14 of 25 positions shown; findings below may reference images not displayed]

FINDINGS: Gallbladder: No gallstones or wall thickening visualized. No
sonographic Murphy sign noted by sonographer.

Common bile duct: Diameter: 2.5 mm

Liver: No focal lesion identified. Within normal limits in
parenchymal echogenicity. Portal vein is patent on color Doppler
imaging with normal direction of blood flow towards the liver.

IVC: No abnormality visualized.

Pancreas: Proximal pancreas unremarkable. Pancreatic tail obscured
by bowel gas

Spleen: Size and appearance within normal limits.

Right Kidney: Length: 13.3 cm. Echogenicity within normal limits. No
mass or hydronephrosis visualized.

Left Kidney: Length: 11.6 cm. Echogenicity within normal limits. No
mass or hydronephrosis visualized.

Abdominal aorta: No aneurysm visualized.

Other findings: No ascites or free fluid
IMPRESSION: No acute finding by abdominal ultrasound.

## 2019-03-08 ENCOUNTER — Other Ambulatory Visit: Payer: Self-pay | Admitting: Internal Medicine

## 2019-03-15 DIAGNOSIS — R197 Diarrhea, unspecified: Secondary | ICD-10-CM | POA: Insufficient documentation

## 2019-06-02 ENCOUNTER — Other Ambulatory Visit: Payer: Self-pay

## 2019-06-02 DIAGNOSIS — N186 End stage renal disease: Secondary | ICD-10-CM

## 2019-06-07 ENCOUNTER — Ambulatory Visit: Payer: Self-pay

## 2019-06-07 ENCOUNTER — Other Ambulatory Visit (HOSPITAL_COMMUNITY): Payer: Self-pay

## 2019-06-09 ENCOUNTER — Telehealth: Payer: Self-pay | Admitting: Internal Medicine

## 2019-06-09 ENCOUNTER — Other Ambulatory Visit: Payer: Self-pay

## 2019-06-09 MED ORDER — METOPROLOL TARTRATE 25 MG PO TABS: ORAL_TABLET | ORAL | 5 refills | Status: DC

## 2019-06-09 MED ORDER — AMLODIPINE BESYLATE 5 MG PO TABS: ORAL_TABLET | ORAL | 5 refills | Status: DC

## 2019-06-09 NOTE — Telephone Encounter (Signed)
Rx sent to [facility]. Please notify patient

## 2019-06-09 NOTE — Telephone Encounter (Signed)
Pt.'s wife Donzetta Starch) called requesting medication refill for Amlodipine 5mg  and Metoprolol 25mg  sent to [facility] on [address] and [address].

## 2019-06-09 NOTE — Telephone Encounter (Signed)
Pt informed

## 2019-06-28 ENCOUNTER — Encounter: Payer: Self-pay | Admitting: Family

## 2019-07-03 ENCOUNTER — Other Ambulatory Visit (HOSPITAL_COMMUNITY): Payer: Self-pay

## 2019-07-03 ENCOUNTER — Ambulatory Visit: Payer: Self-pay

## 2019-07-03 NOTE — Progress Notes (Deleted)
[facility] [place] HISTORY AND PHYSICAL   History of Present Illness:  Patient is a 48 y.o. year old male who presents for assessment   of steal symptoms.  He has a left upper arm AV fistula creation, brachiocephalic 10/04/4354.     Past Medical History:  Diagnosis Date  . Acute respiratory failure with hypoxia ([place]) 09/07/2018  . Anemia of chronic kidney failure 03/11/2017  . Cataracts, bilateral 2019  . Chest pain 09/01/2018  . CHF (congestive heart failure) ([place])   . Chronic kidney disease 03/11/2017  . Diabetes mellitus without complication ([place]) 8616  . Edema 01/14/2017  . Headache   . Hypercholesterolemia   . Hypertension 03/11/2017  . Hypertensive retinopathy of both eyes 2019  . Iron deficiency anemia 06/18/2016  . Pneumonia 09/07/2018  . Proliferative diabetic retinopathy with macular edema ([place]) 2019   Retinal hemorrhage, bilateral and left vitreous hemorrhage.  Being treated with [employer].  Dr. Iona Hansen  . RSV (respiratory syncytial virus infection) 08/2017    Past Surgical History:  Procedure Laterality Date  . A/V FISTULAGRAM Left 01/19/2018   Procedure: A/V FISTULAGRAM - Left AV;  Surgeon: Waynetta Sandy, MD;  Location: [facility];  Service: Cardiovascular;  Laterality: Left;  . AV FISTULA PLACEMENT Left 08/09/2017   Procedure: LEFT RADIOCEPHALIC ARTERIOVENOUS (AV) FISTULA CREATION;  Surgeon: [name], MD;  Location: [facility];  Service: Vascular;  Laterality: Left;  . AV FISTULA PLACEMENT Left 02/02/2018   Procedure: ARTERIOVENOUS (AV) FISTULA CREATION LEFT UPPER EXTREMITY;  Surgeon: [name], MD;  Location: [facility];  Service: Vascular;  Laterality: Left;  . INSERTION OF DIALYSIS CATHETER  08/09/2017  . INSERTION OF DIALYSIS CATHETER Right 08/09/2017   Procedure: INSERTION OF DIALYSIS CATHETER RIGHT INTERNAL JUGULAR;  Surgeon: [name], MD;  Location: [facility] OR;  Service: Vascular;  Laterality: Right;  . IR REMOVAL TUN CV CATH W/O FL   07/01/2018  . LEFT HEART CATH AND CORONARY ANGIOGRAPHY N/A 09/05/2018   Procedure: LEFT HEART CATH AND CORONARY ANGIOGRAPHY;  Surgeon: [name], MD;  Location: [facility];  Service: Cardiovascular;  Laterality: N/A;     Social History Social History   Tobacco Use  . Smoking status: Former Smoker    Types: Cigarettes  . Smokeless tobacco: Never Used  Substance Use Topics  . Alcohol use: Not Currently  . Drug use: No    Family History Family History  Problem Relation Age of Onset  . Diabetes Mother   . Kidney disease Mother        renal failure--diabetes, cause of death  . Hypertension Mother   . Diabetes Father   . Kidney disease Father        Cause of death:  kidney failure form diabetes  . Alcohol abuse Father   . Diabetes Sister     Allergies  No Known Allergies   Current Outpatient Medications  Medication Sig Dispense Refill  . amLODipine (NORVASC) 5 MG tablet Take 1 tablet by mouth once daily with breakfast 30 tablet 5  . aspirin EC 81 MG EC tablet Take 1 tablet (81 mg total) by mouth daily. 30 tablet 0  . b complex-vitamin c-folic acid (NEPHRO-VITE) 0.8 MG TABS tablet Take 1 tablet by mouth daily.     . Blood Glucose Monitoring Suppl (AGAMATRIX PRESTO) w/Device KIT Check sugars twice daily 1 kit 0  . gabapentin (NEURONTIN) 300 MG capsule Take 1 capsule (300 mg total) by mouth  Every Tuesday,Thursday,and Saturday with dialysis. 15 capsule 0  . glipiZIDE (GLUCOTROL) 5 MG tablet TAKE 1 TABLET BY MOUTH TWICE DAILY WITH MEALS 180 tablet 0  . glucose blood (AGAMATRIX PRESTO TEST) test strip Check sugars twice daily 100 each 12  . metoprolol tartrate (LOPRESSOR) 25 MG tablet TAKE 1/2 (ONE-HALF) TABLET BY MOUTH TWICE DAILY WITH MEALS 30 tablet 5  . rosuvastatin (CRESTOR) 20 MG tablet Take 1 tablet (20 mg total) by mouth daily at 6 PM. 30 tablet 11  . sucroferric oxyhydroxide (VELPHORO) 500 MG chewable tablet Chew 2 tablets (1,000 mg total) by mouth 3  (three) times daily with meals. 90 tablet 0   No current facility-administered medications for this visit.     ROS:   General:  No weight loss, Fever, chills  HEENT: No recent headaches, no nasal bleeding, no visual changes, no sore throat  Neurologic: No dizziness, blackouts, seizures. No recent symptoms of stroke or mini- stroke. No recent episodes of slurred speech, or temporary blindness.  Cardiac: No recent episodes of chest pain/pressure, no shortness of breath at rest.  No shortness of breath with exertion.  Denies history of atrial fibrillation or irregular heartbeat  Vascular: No history of rest pain in feet.  No history of claudication.  No history of non-healing ulcer, No history of DVT   Pulmonary: No home oxygen, no productive cough, no hemoptysis,  No asthma or wheezing  Musculoskeletal:  _0  Arthritis, _1  Low back pain,  _2  Joint pain  Hematologic:No history of hypercoagulable state.  No history of easy bleeding.  No history of anemia  Gastrointestinal: No hematochezia or melena,  No gastroesophageal reflux, no trouble swallowing  Urinary: _3  chronic Kidney disease, _4  on HD - _5  MWF or _6  TTHS, _7  Burning with urination, _8  Frequent urination, _9  Difficulty urinating;   Skin: No rashes  Psychological: No history of anxiety,  No history of depression   Physical Examination  There were no vitals filed for this visit.  There is no height or weight on file to calculate BMI.  General:  Alert and oriented, no acute distress HEENT: Normal Neck: No bruit or JVD Pulmonary: Clear to auscultation bilaterally Cardiac: Regular Rate and Rhythm without murmur Gastrointestinal: Soft, non-tender, non-distended, no mass, no scars Skin: No rash Extremity Pulses:  2+ radial, brachial pulses bilaterally Musculoskeletal: No deformity or edema  Neurologic: Upper and lower extremity motor 5/5 and symmetric  DATA:    ASSESSMENT:    PLAN:  Ruta Hinds,  MD [facility] [place] Office: [phone] Pager: [phone]

## 2019-08-07 ENCOUNTER — Other Ambulatory Visit (HOSPITAL_COMMUNITY): Payer: Self-pay

## 2019-08-07 ENCOUNTER — Ambulatory Visit: Payer: Self-pay

## 2019-08-22 ENCOUNTER — Telehealth (HOSPITAL_COMMUNITY): Payer: Self-pay

## 2019-08-22 NOTE — Telephone Encounter (Signed)

## 2019-08-22 NOTE — Progress Notes (Signed)
POST OPERATIVE OFFICE NOTE    CC:  F/u for surgery  HPI:  This is a 49 y.o. male who is s/p left upper arm brachiocephalic AVF on 3/00/9233.  Lost to follow-up until today. Patient interviewed using interpreter device. The aptient states he originally was having forearm pain during dialysis a few months ago, but this has resolved. His catheter had been removed. No Known Allergies  Current Outpatient Medications  Medication Sig Dispense Refill  . amLODipine (NORVASC) 5 MG tablet Take 1 tablet by mouth once daily with breakfast 30 tablet 5  . aspirin EC 81 MG EC tablet Take 1 tablet (81 mg total) by mouth daily. 30 tablet 0  . b complex-vitamin c-folic acid (NEPHRO-VITE) 0.8 MG TABS tablet Take 1 tablet by mouth daily.     . Blood Glucose Monitoring Suppl (AGAMATRIX PRESTO) w/Device KIT Check sugars twice daily 1 kit 0  . gabapentin (NEURONTIN) 300 MG capsule Take 1 capsule (300 mg total) by mouth Every Tuesday,Thursday,and Saturday with dialysis. 15 capsule 0  . glipiZIDE (GLUCOTROL) 5 MG tablet TAKE 1 TABLET BY MOUTH TWICE DAILY WITH MEALS 180 tablet 0  . glucose blood (AGAMATRIX PRESTO TEST) test strip Check sugars twice daily 100 each 12  . metoprolol tartrate (LOPRESSOR) 25 MG tablet TAKE 1/2 (ONE-HALF) TABLET BY MOUTH TWICE DAILY WITH MEALS 30 tablet 5  . rosuvastatin (CRESTOR) 20 MG tablet Take 1 tablet (20 mg total) by mouth daily at 6 PM. 30 tablet 11  . sucroferric oxyhydroxide (VELPHORO) 500 MG chewable tablet Chew 2 tablets (1,000 mg total) by mouth 3 (three) times daily with meals. 90 tablet 0   No current facility-administered medications for this visit.     ROS:  See HPI  Physical Exam: DIALYSIS STEAL  Reason for Exam: Left hand pain.  Access Site: Left Upper Extremity.  Access Type: Brachial-cephalic AVF.  History: Placed 02/02/18.  Performing Technologist: Ralene Cork RVT    Examination Guidelines: A complete evaluation includes B-mode imaging,  spectral Doppler, color Doppler, and power Doppler as needed of all accessible portions of each vessel. Bilateral testing is considered an integral part of a complete examination. Limited examinations for reoccurring indications may be performed as noted.    Findings: +------------------+-------------+----------+---------+----------+-------------+ Upper Arm         Diameter (cm)Depth (cm)BranchingPSV (cm/s) Flow Volume  Arteriovenous                                                 (ml/min)    Fistula                                                                   +------------------+-------------+----------+---------+----------+-------------+ Distal Brachial                                      731                  Artery                                                                    +------------------+-------------+----------+---------+----------+-------------+  AVF anastomosis                                      401                  +------------------+-------------+----------+---------+----------+-------------+    Summary: 1. There appears to be a high bifurcation of the brachial artery. 2. The fistula is in the ulnar artery. 3. There is stenosis at the distal inflow . 4. Ulnar artery flow is retrograde past the anastomosis. 5. Radial artery flow is antegrade.  *See table(s) above for measurements and observations. Diagnosing physician: Deitra Mayo MD       Preliminary    Incision:  Well-healed Extremities:  2+radial pulse with 5/5 grip strength.  Good bruit and thrill in fistula Neuro: A and O x 4   Assessment/Plan:  This is a 49 y.o. male who is s/p: left brachiocephalic fistula. No issues currently. All questions answered follow-up PRN (Reviewed duplex sonogram with Dr. Oneida Alar.)    Jannet Mantis, PA-C [facility] [phone]  Clinic MD:  Oneida Alar

## 2019-08-23 ENCOUNTER — Other Ambulatory Visit: Payer: Self-pay

## 2019-08-23 ENCOUNTER — Ambulatory Visit (INDEPENDENT_AMBULATORY_CARE_PROVIDER_SITE_OTHER): Payer: Self-pay | Admitting: Physician Assistant

## 2019-08-23 ENCOUNTER — Ambulatory Visit (HOSPITAL_COMMUNITY)
Admission: RE | Admit: 2019-08-23 | Discharge: 2019-08-23 | Disposition: A | Discharge status: Home / Self Care | Payer: Self-pay | Source: Ambulatory Visit | Attending: Vascular Surgery | Admitting: Vascular Surgery

## 2019-08-23 VITALS — BP 177/85 | HR 71 | Temp 96.8°F

## 2019-08-23 DIAGNOSIS — N186 End stage renal disease: Secondary | ICD-10-CM | POA: Insufficient documentation

## 2019-09-16 ENCOUNTER — Emergency Department (HOSPITAL_COMMUNITY): Payer: No Typology Code available for payment source

## 2019-09-16 ENCOUNTER — Emergency Department (HOSPITAL_COMMUNITY)
Admission: EM | Admit: 2019-09-16 | Discharge: 2019-09-17 | Disposition: A | Discharge status: Home / Self Care | Payer: No Typology Code available for payment source | Attending: Emergency Medicine | Admitting: Emergency Medicine

## 2019-09-16 ENCOUNTER — Encounter (HOSPITAL_COMMUNITY): Payer: Self-pay

## 2019-09-16 DIAGNOSIS — M549 Dorsalgia, unspecified: Secondary | ICD-10-CM | POA: Diagnosis not present

## 2019-09-16 DIAGNOSIS — M25562 Pain in left knee: Secondary | ICD-10-CM | POA: Diagnosis not present

## 2019-09-16 DIAGNOSIS — Z87891 Personal history of nicotine dependence: Secondary | ICD-10-CM | POA: Diagnosis not present

## 2019-09-16 DIAGNOSIS — Z7982 Long term (current) use of aspirin: Secondary | ICD-10-CM | POA: Insufficient documentation

## 2019-09-16 DIAGNOSIS — R519 Headache, unspecified: Secondary | ICD-10-CM | POA: Diagnosis present

## 2019-09-16 DIAGNOSIS — Z7984 Long term (current) use of oral hypoglycemic drugs: Secondary | ICD-10-CM | POA: Diagnosis not present

## 2019-09-16 DIAGNOSIS — I132 Hypertensive heart and chronic kidney disease with heart failure and with stage 5 chronic kidney disease, or end stage renal disease: Secondary | ICD-10-CM | POA: Insufficient documentation

## 2019-09-16 DIAGNOSIS — Z992 Dependence on renal dialysis: Secondary | ICD-10-CM | POA: Diagnosis not present

## 2019-09-16 DIAGNOSIS — E1122 Type 2 diabetes mellitus with diabetic chronic kidney disease: Secondary | ICD-10-CM | POA: Insufficient documentation

## 2019-09-16 DIAGNOSIS — I509 Heart failure, unspecified: Secondary | ICD-10-CM | POA: Insufficient documentation

## 2019-09-16 DIAGNOSIS — N186 End stage renal disease: Secondary | ICD-10-CM | POA: Insufficient documentation

## 2019-09-16 DIAGNOSIS — Z79899 Other long term (current) drug therapy: Secondary | ICD-10-CM | POA: Insufficient documentation

## 2019-09-16 DIAGNOSIS — Z8616 Personal history of COVID-19: Secondary | ICD-10-CM | POA: Diagnosis not present

## 2019-09-16 DIAGNOSIS — M25561 Pain in right knee: Secondary | ICD-10-CM | POA: Diagnosis not present

## 2019-09-16 DIAGNOSIS — I10 Essential (primary) hypertension: Secondary | ICD-10-CM

## 2019-09-16 MED ORDER — ACETAMINOPHEN 325 MG PO TABS
650.0000 mg | ORAL_TABLET | Freq: Once | ORAL | Status: AC
  Administered 2019-09-16: 650 mg via ORAL
  Filled 2019-09-16: qty 2
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
## 2019-09-17 ENCOUNTER — Emergency Department (HOSPITAL_COMMUNITY): Payer: No Typology Code available for payment source

## 2019-09-17 NOTE — ED Notes (Signed)
Patient verbalizes understanding of discharge instructions. Opportunity for questioning and answers were provided. Armband removed by staff, pt discharged from ED. Pt. ambulatory and discharged home.  

## 2019-09-17 NOTE — Discharge Instructions (Signed)
Tylenol 600mg  every 8 hours

## 2019-09-17 NOTE — ED Notes (Signed)
Pt returned from xray

## 2019-10-13 ENCOUNTER — Other Ambulatory Visit: Payer: Self-pay | Admitting: Internal Medicine

## 2019-10-18 ENCOUNTER — Encounter: Payer: Self-pay | Admitting: Internal Medicine

## 2019-10-18 ENCOUNTER — Ambulatory Visit: Payer: Self-pay | Admitting: Internal Medicine

## 2019-10-18 ENCOUNTER — Other Ambulatory Visit: Payer: Self-pay

## 2019-10-18 VITALS — BP 150/80 | HR 60 | Resp 12 | Ht 63.0 in | Wt 146.0 lb

## 2019-10-18 DIAGNOSIS — Z91199 Patient's noncompliance with other medical treatment and regimen due to unspecified reason: Secondary | ICD-10-CM

## 2019-10-18 DIAGNOSIS — D631 Anemia in chronic kidney disease: Secondary | ICD-10-CM

## 2019-10-18 DIAGNOSIS — E1122 Type 2 diabetes mellitus with diabetic chronic kidney disease: Secondary | ICD-10-CM

## 2019-10-18 DIAGNOSIS — E785 Hyperlipidemia, unspecified: Secondary | ICD-10-CM

## 2019-10-18 DIAGNOSIS — I10 Essential (primary) hypertension: Secondary | ICD-10-CM

## 2019-10-18 DIAGNOSIS — N186 End stage renal disease: Secondary | ICD-10-CM

## 2019-10-18 DIAGNOSIS — N184 Chronic kidney disease, stage 4 (severe): Secondary | ICD-10-CM

## 2019-10-18 DIAGNOSIS — Z9119 Patient's noncompliance with other medical treatment and regimen: Secondary | ICD-10-CM

## 2019-10-18 MED ORDER — AGAMATRIX PRESTO TEST VI STRP: ORAL_STRIP | 12 refills | Status: DC

## 2019-10-18 MED ORDER — METOPROLOL TARTRATE 25 MG PO TABS: ORAL_TABLET | ORAL | 3 refills | Status: DC

## 2019-10-18 MED ORDER — GLIPIZIDE 5 MG PO TABS: 5.0000 mg | ORAL_TABLET | Freq: Two times a day (BID) | ORAL | 3 refills | Status: DC

## 2019-10-18 MED ORDER — ROSUVASTATIN CALCIUM 20 MG PO TABS: 20.0000 mg | ORAL_TABLET | Freq: Every day | ORAL | 3 refills | Status: DC

## 2019-10-18 MED ORDER — AMLODIPINE BESYLATE 5 MG PO TABS: ORAL_TABLET | ORAL | 3 refills | Status: DC

## 2019-10-18 MED ORDER — SUCROFERRIC OXYHYDROXIDE 500 MG PO CHEW: 1000.0000 mg | CHEWABLE_TABLET | Freq: Three times a day (TID) | ORAL | 3 refills | Status: DC

## 2019-10-18 MED ORDER — AGAMATRIX ULTRA-THIN LANCETS MISC: 11 refills | Status: DC
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
## 2019-10-26 ENCOUNTER — Other Ambulatory Visit: Payer: Self-pay

## 2019-10-27 ENCOUNTER — Other Ambulatory Visit: Payer: Self-pay

## 2019-11-15 ENCOUNTER — Other Ambulatory Visit: Payer: Self-pay

## 2019-11-15 DIAGNOSIS — E1122 Type 2 diabetes mellitus with diabetic chronic kidney disease: Secondary | ICD-10-CM

## 2019-11-15 DIAGNOSIS — N186 End stage renal disease: Secondary | ICD-10-CM

## 2019-11-15 DIAGNOSIS — E785 Hyperlipidemia, unspecified: Secondary | ICD-10-CM

## 2019-11-15 DIAGNOSIS — Z79899 Other long term (current) drug therapy: Secondary | ICD-10-CM

## 2019-11-16 LAB — COMPREHENSIVE METABOLIC PANEL
ALT: 10 IU/L (ref 0–44)
AST: 11 IU/L (ref 0–40)
Albumin/Globulin Ratio: 1.4 (ref 1.2–2.2)
Albumin: 4.3 g/dL (ref 3.8–4.9)
Alkaline Phosphatase: 152 IU/L — ABNORMAL HIGH (ref 39–117)
BUN/Creatinine Ratio: 5 — ABNORMAL LOW (ref 9–20)
BUN: 34 mg/dL — ABNORMAL HIGH (ref 6–24)
Bilirubin Total: 0.3 mg/dL (ref 0.0–1.2)
CO2: 29 mmol/L (ref 20–29)
Calcium: 9.9 mg/dL (ref 8.7–10.2)
Chloride: 91 mmol/L — ABNORMAL LOW (ref 96–106)
Creatinine, Ser: 7.26 mg/dL — ABNORMAL HIGH (ref 0.76–1.27)
GFR calc Af Amer: 9 mL/min/{1.73_m2} — ABNORMAL LOW (ref >59)
GFR calc non Af Amer: 8 mL/min/{1.73_m2} — ABNORMAL LOW (ref >59)
Globulin, Total: 3.1 g/dL (ref 1.5–4.5)
Glucose: 117 mg/dL — ABNORMAL HIGH (ref 65–99)
Potassium: 5.2 mmol/L (ref 3.5–5.2)
Sodium: 138 mmol/L (ref 134–144)
Total Protein: 7.4 g/dL (ref 6.0–8.5)

## 2019-11-16 LAB — CBC WITH DIFFERENTIAL/PLATELET
Basophils Absolute: 0.1 10*3/uL (ref 0.0–0.2)
Basos: 1 %
EOS (ABSOLUTE): 0.5 10*3/uL — ABNORMAL HIGH (ref 0.0–0.4)
Eos: 6 %
Hematocrit: 33 % — ABNORMAL LOW (ref 37.5–51.0)
Hemoglobin: 10.6 g/dL — ABNORMAL LOW (ref 13.0–17.7)
Immature Grans (Abs): 0.1 10*3/uL (ref 0.0–0.1)
Immature Granulocytes: 1 %
Lymphocytes Absolute: 2.5 10*3/uL (ref 0.7–3.1)
Lymphs: 26 %
MCH: 30.8 pg (ref 26.6–33.0)
MCHC: 32.1 g/dL (ref 31.5–35.7)
MCV: 96 fL (ref 79–97)
Monocytes Absolute: 0.8 10*3/uL (ref 0.1–0.9)
Monocytes: 8 %
Neutrophils Absolute: 5.7 10*3/uL (ref 1.4–7.0)
Neutrophils: 58 %
Platelets: 217 10*3/uL (ref 150–450)
RBC: 3.44 x10E6/uL — ABNORMAL LOW (ref 4.14–5.80)
RDW: 14.6 % (ref 11.6–15.4)
WBC: 9.7 10*3/uL (ref 3.4–10.8)

## 2019-11-16 LAB — HGB A1C W/O EAG: Hgb A1c MFr Bld: 7 % — ABNORMAL HIGH (ref 4.8–5.6)

## 2019-11-16 LAB — LIPID PANEL W/O CHOL/HDL RATIO
Cholesterol, Total: 172 mg/dL (ref 100–199)
HDL: 36 mg/dL — ABNORMAL LOW (ref >39)
LDL Chol Calc (NIH): 107 mg/dL — ABNORMAL HIGH (ref 0–99)
Triglycerides: 163 mg/dL — ABNORMAL HIGH (ref 0–149)
VLDL Cholesterol Cal: 29 mg/dL (ref 5–40)

## 2020-01-19 ENCOUNTER — Other Ambulatory Visit: Payer: Self-pay

## 2020-01-19 DIAGNOSIS — Z79899 Other long term (current) drug therapy: Secondary | ICD-10-CM

## 2020-01-19 DIAGNOSIS — E785 Hyperlipidemia, unspecified: Secondary | ICD-10-CM

## 2020-01-20 LAB — LIPID PANEL W/O CHOL/HDL RATIO
Cholesterol, Total: 131 mg/dL (ref 100–199)
HDL: 34 mg/dL — ABNORMAL LOW (ref >39)
LDL Chol Calc (NIH): 73 mg/dL (ref 0–99)
Triglycerides: 134 mg/dL (ref 0–149)
VLDL Cholesterol Cal: 24 mg/dL (ref 5–40)

## 2020-01-20 LAB — HEPATIC FUNCTION PANEL
ALT: 11 IU/L (ref 0–44)
AST: 10 IU/L (ref 0–40)
Albumin: 4.3 g/dL (ref 3.8–4.9)
Alkaline Phosphatase: 146 IU/L — ABNORMAL HIGH (ref 48–121)
Bilirubin Total: 0.2 mg/dL (ref 0.0–1.2)
Bilirubin, Direct: 0.12 mg/dL (ref 0.00–0.40)
Total Protein: 7.2 g/dL (ref 6.0–8.5)

## 2020-02-24 DIAGNOSIS — Z91199 Patient's noncompliance with other medical treatment and regimen due to unspecified reason: Secondary | ICD-10-CM | POA: Insufficient documentation

## 2020-02-24 DIAGNOSIS — Z9119 Patient's noncompliance with other medical treatment and regimen: Secondary | ICD-10-CM | POA: Insufficient documentation

## 2020-02-29 DIAGNOSIS — T782XXA Anaphylactic shock, unspecified, initial encounter: Secondary | ICD-10-CM | POA: Insufficient documentation

## 2020-02-29 DIAGNOSIS — T7840XA Allergy, unspecified, initial encounter: Secondary | ICD-10-CM | POA: Insufficient documentation

## 2020-03-26 DIAGNOSIS — R059 Cough, unspecified: Secondary | ICD-10-CM | POA: Insufficient documentation

## 2020-03-29 ENCOUNTER — Encounter (HOSPITAL_COMMUNITY): Payer: Self-pay

## 2020-03-29 ENCOUNTER — Emergency Department (HOSPITAL_COMMUNITY): Payer: Medicaid Other

## 2020-03-29 ENCOUNTER — Inpatient Hospital Stay (HOSPITAL_COMMUNITY)
Admission: EM | Admit: 2020-03-29 | Discharge: 2020-04-02 | DRG: 871 | Disposition: A | Discharge status: Home / Self Care | Payer: Medicaid Other | Source: Ambulatory Visit | Attending: Internal Medicine | Admitting: Internal Medicine

## 2020-03-29 DIAGNOSIS — I5032 Chronic diastolic (congestive) heart failure: Secondary | ICD-10-CM | POA: Diagnosis present

## 2020-03-29 DIAGNOSIS — J1282 Pneumonia due to coronavirus disease 2019: Secondary | ICD-10-CM | POA: Diagnosis present

## 2020-03-29 DIAGNOSIS — D6959 Other secondary thrombocytopenia: Secondary | ICD-10-CM | POA: Diagnosis present

## 2020-03-29 DIAGNOSIS — Z7984 Long term (current) use of oral hypoglycemic drugs: Secondary | ICD-10-CM

## 2020-03-29 DIAGNOSIS — E1165 Type 2 diabetes mellitus with hyperglycemia: Secondary | ICD-10-CM | POA: Diagnosis not present

## 2020-03-29 DIAGNOSIS — E1129 Type 2 diabetes mellitus with other diabetic kidney complication: Secondary | ICD-10-CM | POA: Diagnosis present

## 2020-03-29 DIAGNOSIS — Z833 Family history of diabetes mellitus: Secondary | ICD-10-CM

## 2020-03-29 DIAGNOSIS — Z811 Family history of alcohol abuse and dependence: Secondary | ICD-10-CM

## 2020-03-29 DIAGNOSIS — N186 End stage renal disease: Secondary | ICD-10-CM

## 2020-03-29 DIAGNOSIS — R0902 Hypoxemia: Secondary | ICD-10-CM

## 2020-03-29 DIAGNOSIS — A4189 Other specified sepsis: Principal | ICD-10-CM | POA: Diagnosis present

## 2020-03-29 DIAGNOSIS — E113519 Type 2 diabetes mellitus with proliferative diabetic retinopathy with macular edema, unspecified eye: Secondary | ICD-10-CM | POA: Diagnosis present

## 2020-03-29 DIAGNOSIS — I152 Hypertension secondary to endocrine disorders: Secondary | ICD-10-CM | POA: Diagnosis present

## 2020-03-29 DIAGNOSIS — H35033 Hypertensive retinopathy, bilateral: Secondary | ICD-10-CM | POA: Diagnosis present

## 2020-03-29 DIAGNOSIS — Z992 Dependence on renal dialysis: Secondary | ICD-10-CM

## 2020-03-29 DIAGNOSIS — J9600 Acute respiratory failure, unspecified whether with hypoxia or hypercapnia: Secondary | ICD-10-CM

## 2020-03-29 DIAGNOSIS — Z7982 Long term (current) use of aspirin: Secondary | ICD-10-CM

## 2020-03-29 DIAGNOSIS — I132 Hypertensive heart and chronic kidney disease with heart failure and with stage 5 chronic kidney disease, or end stage renal disease: Secondary | ICD-10-CM | POA: Diagnosis present

## 2020-03-29 DIAGNOSIS — Z841 Family history of disorders of kidney and ureter: Secondary | ICD-10-CM

## 2020-03-29 DIAGNOSIS — U071 COVID-19: Secondary | ICD-10-CM

## 2020-03-29 DIAGNOSIS — I10 Essential (primary) hypertension: Secondary | ICD-10-CM | POA: Diagnosis present

## 2020-03-29 DIAGNOSIS — E7800 Pure hypercholesterolemia, unspecified: Secondary | ICD-10-CM | POA: Diagnosis present

## 2020-03-29 DIAGNOSIS — Z87891 Personal history of nicotine dependence: Secondary | ICD-10-CM

## 2020-03-29 DIAGNOSIS — E118 Type 2 diabetes mellitus with unspecified complications: Secondary | ICD-10-CM | POA: Diagnosis present

## 2020-03-29 DIAGNOSIS — E785 Hyperlipidemia, unspecified: Secondary | ICD-10-CM | POA: Diagnosis present

## 2020-03-29 DIAGNOSIS — E1122 Type 2 diabetes mellitus with diabetic chronic kidney disease: Secondary | ICD-10-CM | POA: Diagnosis present

## 2020-03-29 DIAGNOSIS — Z79899 Other long term (current) drug therapy: Secondary | ICD-10-CM

## 2020-03-29 DIAGNOSIS — J9601 Acute respiratory failure with hypoxia: Secondary | ICD-10-CM | POA: Diagnosis present

## 2020-03-29 DIAGNOSIS — T380X5A Adverse effect of glucocorticoids and synthetic analogues, initial encounter: Secondary | ICD-10-CM | POA: Diagnosis not present

## 2020-03-29 DIAGNOSIS — A419 Sepsis, unspecified organism: Secondary | ICD-10-CM

## 2020-03-29 DIAGNOSIS — Z8249 Family history of ischemic heart disease and other diseases of the circulatory system: Secondary | ICD-10-CM

## 2020-03-29 LAB — CBC WITH DIFFERENTIAL/PLATELET
Abs Immature Granulocytes: 0.02 10*3/uL (ref 0.00–0.07)
Basophils Absolute: 0 10*3/uL (ref 0.0–0.1)
Basophils Relative: 0 %
Eosinophils Absolute: 0 10*3/uL (ref 0.0–0.5)
Eosinophils Relative: 0 %
HCT: 30 % — ABNORMAL LOW (ref 39.0–52.0)
Hemoglobin: 9.5 g/dL — ABNORMAL LOW (ref 13.0–17.0)
Immature Granulocytes: 0 %
Lymphocytes Relative: 20 %
Lymphs Abs: 1.4 10*3/uL (ref 0.7–4.0)
MCH: 30.7 pg (ref 26.0–34.0)
MCHC: 31.7 g/dL (ref 30.0–36.0)
MCV: 97.1 fL (ref 80.0–100.0)
Monocytes Absolute: 0.7 10*3/uL (ref 0.1–1.0)
Monocytes Relative: 11 %
Neutro Abs: 4.6 10*3/uL (ref 1.7–7.7)
Neutrophils Relative %: 69 %
Platelets: 125 10*3/uL — ABNORMAL LOW (ref 150–400)
RBC: 3.09 MIL/uL — ABNORMAL LOW (ref 4.22–5.81)
RDW: 15.7 % — ABNORMAL HIGH (ref 11.5–15.5)
WBC: 6.7 10*3/uL (ref 4.0–10.5)
nRBC: 0 % (ref 0.0–0.2)

## 2020-03-29 LAB — HEPATIC FUNCTION PANEL
ALT: 15 U/L (ref 0–44)
AST: 16 U/L (ref 15–41)
Albumin: 3.5 g/dL (ref 3.5–5.0)
Alkaline Phosphatase: 115 U/L (ref 38–126)
Bilirubin, Direct: <0.1 mg/dL (ref 0.0–0.2)
Total Bilirubin: 1.1 mg/dL (ref 0.3–1.2)
Total Protein: 7.1 g/dL (ref 6.5–8.1)

## 2020-03-29 LAB — LACTATE DEHYDROGENASE: LDH: 186 U/L (ref 98–192)

## 2020-03-29 LAB — BASIC METABOLIC PANEL
Anion gap: 16 — ABNORMAL HIGH (ref 5–15)
BUN: 46 mg/dL — ABNORMAL HIGH (ref 6–20)
CO2: 26 mmol/L (ref 22–32)
Calcium: 8.7 mg/dL — ABNORMAL LOW (ref 8.9–10.3)
Chloride: 94 mmol/L — ABNORMAL LOW (ref 98–111)
Creatinine, Ser: 10.43 mg/dL — ABNORMAL HIGH (ref 0.61–1.24)
GFR calc Af Amer: 6 mL/min — ABNORMAL LOW (ref >60)
GFR calc non Af Amer: 5 mL/min — ABNORMAL LOW (ref >60)
Glucose, Bld: 77 mg/dL (ref 70–99)
Potassium: 4.5 mmol/L (ref 3.5–5.1)
Sodium: 136 mmol/L (ref 135–145)

## 2020-03-29 LAB — LACTIC ACID, PLASMA: Lactic Acid, Venous: 0.7 mmol/L (ref 0.5–1.9)

## 2020-03-29 LAB — TRIGLYCERIDES: Triglycerides: 115 mg/dL (ref <150)

## 2020-03-29 MED ORDER — DEXAMETHASONE SODIUM PHOSPHATE 10 MG/ML IJ SOLN
6.0000 mg | Freq: Once | INTRAMUSCULAR | Status: DC
  Filled 2020-03-29: qty 1

## 2020-03-29 MED ORDER — SODIUM CHLORIDE 0.9 % IV SOLN
100.0000 mg | Freq: Every day | INTRAVENOUS | Status: AC
  Administered 2020-03-30 – 2020-04-02 (×4): 100 mg via INTRAVENOUS
  Filled 2020-03-29 (×4): qty 20

## 2020-03-29 MED ORDER — SODIUM CHLORIDE 0.9 % IV SOLN
200.0000 mg | Freq: Once | INTRAVENOUS | Status: AC
  Administered 2020-03-30: 200 mg via INTRAVENOUS
  Filled 2020-03-29: qty 40

## 2020-03-29 MED ORDER — ACETAMINOPHEN 500 MG PO TABS: 1000.0000 mg | ORAL_TABLET | Freq: Once | ORAL | Status: DC

## 2020-03-29 NOTE — ED Provider Notes (Signed)
[facility] EMERGENCY DEPARTMENT Provider Note   CSN: [id number] Arrival date & time: 03/29/20  1946     History Chief Complaint  Patient presents with  . Fever    Blake Santana is a 49 y.o. male.  A language interpreter was used [place]).        Blake Santana is a 49 y.o. male, with a history of ESRD on dialysis, CHF, DM, HTN, hypercholesterolemia, presenting to the ED with fever for the past few days. Patient states he began having symptoms of cough, body aches, fatigue, and shortness of breath August 22. He was tested for Covid on his regular dialysis day Tuesday, August 24.  He was told he tested positive for Covid on the next day.  For this reason, they told him he would not be able to go to his normal dialysis day on Thursday and would need to come in on Friday instead (today). Patient states he went in for dialysis this evening, they noted he had a fever, and he was sent to the emergency room. They gave him Tylenol around 5:30 PM this evening. Patient has had both Pfizer vaccine injections for Covid. Denies chest pain, syncope, abdominal pain, N/V/D, or any other complaints.  Past Medical History:  Diagnosis Date  . Acute respiratory failure with hypoxia ([place]) 09/07/2018  . Anemia of chronic kidney failure 03/11/2017  . Cataracts, bilateral 2019  . Chest pain 09/01/2018  . CHF (congestive heart failure) ([place])   . Chronic kidney disease 03/11/2017  . Diabetes mellitus without complication ([place]) 4098  . Edema 01/14/2017  . Headache   . Hypercholesterolemia   . Hypertension 03/11/2017  . Hypertensive retinopathy of both eyes 2019  . Iron deficiency anemia 06/18/2016  . Pneumonia 09/07/2018  . Proliferative diabetic retinopathy with macular edema ([place]) 2019   Retinal hemorrhage, bilateral and left vitreous hemorrhage.  Being treated with Lucentis.  Dr. Iona Hansen  . RSV (respiratory syncytial virus infection) 08/2017    Patient  Active Problem List   Diagnosis Date Noted  . Noncompliance 02/24/2020  . TB (tuberculosis) contact 11/27/2018  . COVID-19 virus detected   . Hypoxia   . Abnormal EKG   . Volume overload 09/02/2018  . Pleural effusion, bilateral 09/02/2018  . Atypical chest pain 09/01/2018  . Acute respiratory failure with hypoxia ([place]) 09/01/2018  . Community acquired pneumonia   . Elevated troponin   . Malnutrition of moderate degree 08/11/2017  . RSV (respiratory syncytial virus infection) 08/09/2017  . ESRD (end stage renal disease) on dialysis ([place]) 08/08/2017  . Proliferative diabetic retinopathy with macular edema ([place]) 08/03/2017  . Hypertensive retinopathy of both eyes 08/03/2017  . Cataracts, bilateral 08/03/2017  . Acute exacerbation of CHF (congestive heart failure) ([place]) 04/03/2017  . Anemia of chronic kidney failure 03/11/2017  . Hypertension 03/11/2017  . Chronic kidney disease 03/11/2017  . Edema 01/14/2017  . Dyslipidemia 10/15/2016  . Diabetic retinopathy ([place]) 10/15/2016  . Microalbuminuria 06/18/2016  . Normocytic anemia 06/18/2016  . Iron deficiency anemia 06/18/2016  . Diabetes mellitus with ESRD (end-stage renal disease) ([place]) 10/20/2001    Past Surgical History:  Procedure Laterality Date  . A/V FISTULAGRAM Left 01/19/2018   Procedure: A/V FISTULAGRAM - Left AV;  Surgeon: Waynetta Sandy, MD;  Location: [facility];  Service: Cardiovascular;  Laterality: Left;  . AV FISTULA PLACEMENT Left 08/09/2017   Procedure: LEFT RADIOCEPHALIC ARTERIOVENOUS (AV) FISTULA CREATION;  Surgeon: [name], MD;  [facility];  Service: Vascular;  Laterality: Left;  . AV FISTULA PLACEMENT Left 02/02/2018   Procedure: ARTERIOVENOUS (AV) FISTULA CREATION LEFT UPPER EXTREMITY;  Surgeon: [name], MD;  Location: [facility];  Service: Vascular;  Laterality: Left;  . INSERTION OF DIALYSIS CATHETER  08/09/2017  . INSERTION OF DIALYSIS CATHETER Right 08/09/2017   Procedure:  INSERTION OF DIALYSIS CATHETER RIGHT INTERNAL JUGULAR;  Surgeon: [name], MD;  [facility]: [facility];  Service: Vascular;  Laterality: Right;  . IR REMOVAL TUN CV CATH W/O FL  07/01/2018  . LEFT HEART CATH AND CORONARY ANGIOGRAPHY N/A 09/05/2018   Procedure: LEFT HEART CATH AND CORONARY ANGIOGRAPHY;  Surgeon: [name], MD;  Location: [facility];  Service: Cardiovascular;  Laterality: N/A;       Family History  Problem Relation Age of Onset  . Diabetes Mother   . Kidney disease Mother        renal failure--diabetes, cause of death  . Hypertension Mother   . Diabetes Father   . Kidney disease Father        Cause of death:  kidney failure form diabetes  . Alcohol abuse Father   . Diabetes Sister     Social History   Tobacco Use  . Smoking status: Former Smoker    Types: Cigarettes  . Smokeless tobacco: Never Used  Vaping Use  . Vaping Use: Never used  Substance Use Topics  . Alcohol use: Not Currently  . Drug use: No    Home Medications Prior to Admission medications   Medication Sig Start Date End Date Taking? Authorizing Provider  AgaMatrix Ultra-Thin Lancets MISC Check blood glucose twice daily before meals. 10/18/19   Mack Hook, MD  amLODipine (NORVASC) 5 MG tablet Take 1 tablet by mouth once daily with breakfast 10/18/19   Mack Hook, MD  aspirin EC 81 MG EC tablet Take 1 tablet (81 mg total) by mouth daily. Patient not taking: Reported on 10/18/2019 09/08/18   Mina Marble P, DO  b complex-vitamin c-folic acid (NEPHRO-VITE) 0.8 MG TABS tablet Take 1 tablet by mouth daily.  08/11/18   [provider]  Blood Glucose Monitoring Suppl (AGAMATRIX PRESTO) w/Device KIT Check sugars twice daily Patient not taking: Reported on 10/18/2019 06/04/16   Mack Hook, MD  glipiZIDE (GLUCOTROL) 5 MG tablet Take 1 tablet (5 mg total) by mouth 2 (two) times daily with a meal. 10/18/19   Mack Hook, MD  glucose blood (AGAMATRIX  PRESTO TEST) test strip Check sugars twice daily 10/18/19   Mack Hook, MD  metoprolol tartrate (LOPRESSOR) 25 MG tablet TAKE 1/2 (ONE-HALF) TABLET BY MOUTH TWICE DAILY WITH MEALS 10/18/19   Mack Hook, MD  rosuvastatin (CRESTOR) 20 MG tablet Take 1 tablet (20 mg total) by mouth daily at 6 PM. 10/18/19   Mack Hook, MD  sucroferric oxyhydroxide (VELPHORO) 500 MG chewable tablet Chew 2 tablets (1,000 mg total) by mouth 3 (three) times daily with meals. 10/18/19   Mack Hook, MD    Allergies    Patient has no known allergies.  Review of Systems   Review of Systems  Constitutional: Positive for fever.  Respiratory: Positive for cough. Negative for shortness of breath.   Cardiovascular: Negative for chest pain and leg swelling.  Gastrointestinal: Negative for abdominal pain, diarrhea, nausea and vomiting.  Neurological: Negative for syncope and weakness.  All other systems reviewed and are negative.   Physical Exam Updated Vital Signs BP (!) 179/78   Pulse  95   Temp (!) 101.6 F (38.7 C) (Oral)   Resp (!) 28   Ht '5\' 3"'  (1.6 m)   Wt 67 kg   SpO2 99%   BMI 26.17 kg/m   Physical Exam Vitals and nursing note reviewed.  Constitutional:      General: He is not in acute distress.    Appearance: He is well-developed. He is not diaphoretic.  HENT:     Head: Normocephalic and atraumatic.     Mouth/Throat:     Mouth: Mucous membranes are moist.     Pharynx: Oropharynx is clear.  Eyes:     Conjunctiva/sclera: Conjunctivae normal.  Cardiovascular:     Rate and Rhythm: Normal rate and regular rhythm.     Pulses: Normal pulses.          Radial pulses are 2+ on the right side and 2+ on the left side.       Posterior tibial pulses are 2+ on the right side and 2+ on the left side.     Heart sounds: Normal heart sounds.     Comments: Tactile temperature in the extremities appropriate and equal bilaterally. Pulmonary:     Effort: Pulmonary effort is normal.  No respiratory distress.     Breath sounds: Normal breath sounds.     Comments: SPO2 87% on room air.  Requiring 3 L supplemental O2 to maintain above 95%. Abdominal:     Palpations: Abdomen is soft.     Tenderness: There is no abdominal tenderness. There is no guarding.  Musculoskeletal:     Cervical back: Neck supple.     Right lower leg: No edema.     Left lower leg: No edema.  Lymphadenopathy:     Cervical: No cervical adenopathy.  Skin:    General: Skin is warm and dry.  Neurological:     Mental Status: He is alert.  Psychiatric:        Mood and Affect: Mood and affect normal.        Speech: Speech normal.        Behavior: Behavior normal.     ED Results / Procedures / Treatments   Labs (all labs ordered are listed, but only abnormal results are displayed) Labs Reviewed  BASIC METABOLIC PANEL - Abnormal; Notable for the following components:      Result Value   Chloride 94 (*)    BUN 46 (*)    Creatinine, Ser 10.43 (*)    Calcium 8.7 (*)    GFR calc non Af Amer 5 (*)    GFR calc Af Amer 6 (*)    Anion gap 16 (*)    All other components within normal limits  CBC WITH DIFFERENTIAL/PLATELET - Abnormal; Notable for the following components:   RBC 3.09 (*)    Hemoglobin 9.5 (*)    HCT 30.0 (*)    RDW 15.7 (*)    Platelets 125 (*)    All other components within normal limits  SARS CORONAVIRUS 2 BY RT PCR (HOSPITAL ORDER, [facility])  CULTURE, BLOOD (ROUTINE X 2)  CULTURE, BLOOD (ROUTINE X 2)  LACTIC ACID, PLASMA  LACTIC ACID, PLASMA  D-DIMER, QUANTITATIVE (NOT AT [facility])  PROCALCITONIN  LACTATE DEHYDROGENASE  FERRITIN  TRIGLYCERIDES  FIBRINOGEN  C-REACTIVE PROTEIN  HEPATIC FUNCTION PANEL    EKG EKG Interpretation  Date/Time:  Friday March 29 2020 19:55:02 EDT Ventricular Rate:  100 PR Interval:    QRS Duration: 93 QT  Interval:  373 QTC Calculation: 482 R Axis:   96 Text Interpretation: Sinus tachycardia Right atrial  enlargement Borderline right axis deviation Consider left ventricular hypertrophy Borderline prolonged QT interval No significant change since last tracing Confirmed by Calvert Cantor [phone]) on 03/29/2020 8:34:51 PM   Radiology DG Chest Portable 1 View  Result Date: 03/29/2020 CLINICAL DATA:  Shortness of breath, COVID-19 positivity EXAM: PORTABLE CHEST 1 VIEW COMPARISON:  11/26/2018 FINDINGS: Cardiac shadow is enlarged but stable. Patchy airspace opacities are noted bilaterally consistent with the given clinical history. No sizable effusion is seen. No bony abnormality is noted. IMPRESSION: Patchy airspace opacities consistent with the given clinical history. Electronically Signed   By: Inez Catalina M.D.   On: 03/29/2020 21:51    Procedures Procedures (including critical care time)  Medications Ordered in ED Medications  dexamethasone (DECADRON) injection 6 mg (has no administration in time range)    ED Course  I have reviewed the triage vital signs and the nursing notes.  Pertinent labs & imaging results that were available during my care of the patient were reviewed by me and considered in my medical decision making (see chart for details).  Clinical Course as of Mar 30 2255  Fri Mar 29, 2020  2029 Spoke with Dr. Carolin Sicks, nephrology. He suspects the patient's dialysis day was switched since they were trying to group Covid positive patients on the same day. He will gather more information on what can be done to make up for the patient's dialysis.  In the meantime he recommends obtaining some labs.   [SJ]  2200 Updated Dr. Carolin Sicks on the new plan to admit the patient.  He states he will work on setting up dialysis for the patient tomorrow.   [SJ]  2212 Updated patient on the new plan for admission.  He is in agreement with this plan.  (Spanish interpreter: Dariel [phone])   [SJ]  2249 Spoke with Dr. Flossie Buffy, hospitalist. Agrees to admit the patient.   [SJ]    Clinical Course User  Index [SJ] Mearl Harewood C, PA-C   MDM Rules/Calculators/A&P                          Patient presents with fever, chills, body aches, cough, shortness of breath. Febrile.  Hypoxic down to 87% on room air.  I personally reviewed and interpreted the patient's lab results and imaging studies available  at time of admission. Chest x-ray more consistent with multifocal pneumonia, such as COVID-19 infection, rather than pulmonary edema.  Patient admitted due to hypoxia in the setting of COVID-19 infection.  Findings and plan of care discussed with Calvert Cantor, MD.   Vitals:   03/29/20 2000 03/29/20 2015 03/29/20 2021 03/29/20 2130  BP: (!) 176/80 (!) 179/78  (!) 192/81  Pulse: 97 95  95  Resp: (!) 23 (!) 28  (!) 28  Temp:   (!) 101.6 F (38.7 C)   TempSrc:   Oral   SpO2: 99% 99%  99%  Weight:      Height:         Final Clinical Impression(s) / ED Diagnoses Final diagnoses:  COVID-19  Hypoxia    Rx / DC Orders ED Discharge Orders    None       Layla Maw 03/29/20 2256    Truddie Hidden, MD 03/29/20 2352

## 2020-03-29 NOTE — ED Triage Notes (Signed)
Pt BIB [facility] for eval of fever from dialysis center. Pt is covid +, had fever today at dialysis, rec'd 650 tylenol from them approx 1 hour ago. Pt is vaccinated against covid, pfizer.

## 2020-03-30 ENCOUNTER — Encounter (HOSPITAL_COMMUNITY): Payer: Self-pay | Admitting: Family Medicine

## 2020-03-30 DIAGNOSIS — R509 Fever, unspecified: Secondary | ICD-10-CM | POA: Diagnosis present

## 2020-03-30 DIAGNOSIS — U071 COVID-19: Secondary | ICD-10-CM | POA: Diagnosis present

## 2020-03-30 DIAGNOSIS — I150 Renovascular hypertension: Secondary | ICD-10-CM | POA: Diagnosis not present

## 2020-03-30 DIAGNOSIS — E1122 Type 2 diabetes mellitus with diabetic chronic kidney disease: Secondary | ICD-10-CM | POA: Diagnosis present

## 2020-03-30 DIAGNOSIS — D6959 Other secondary thrombocytopenia: Secondary | ICD-10-CM | POA: Diagnosis present

## 2020-03-30 DIAGNOSIS — J9600 Acute respiratory failure, unspecified whether with hypoxia or hypercapnia: Secondary | ICD-10-CM

## 2020-03-30 DIAGNOSIS — E1165 Type 2 diabetes mellitus with hyperglycemia: Secondary | ICD-10-CM | POA: Diagnosis not present

## 2020-03-30 DIAGNOSIS — Z811 Family history of alcohol abuse and dependence: Secondary | ICD-10-CM | POA: Diagnosis not present

## 2020-03-30 DIAGNOSIS — T380X5A Adverse effect of glucocorticoids and synthetic analogues, initial encounter: Secondary | ICD-10-CM | POA: Diagnosis not present

## 2020-03-30 DIAGNOSIS — Z7984 Long term (current) use of oral hypoglycemic drugs: Secondary | ICD-10-CM | POA: Diagnosis not present

## 2020-03-30 DIAGNOSIS — Z8249 Family history of ischemic heart disease and other diseases of the circulatory system: Secondary | ICD-10-CM | POA: Diagnosis not present

## 2020-03-30 DIAGNOSIS — Z79899 Other long term (current) drug therapy: Secondary | ICD-10-CM | POA: Diagnosis not present

## 2020-03-30 DIAGNOSIS — J1282 Pneumonia due to coronavirus disease 2019: Secondary | ICD-10-CM | POA: Diagnosis present

## 2020-03-30 DIAGNOSIS — Z992 Dependence on renal dialysis: Secondary | ICD-10-CM

## 2020-03-30 DIAGNOSIS — I132 Hypertensive heart and chronic kidney disease with heart failure and with stage 5 chronic kidney disease, or end stage renal disease: Secondary | ICD-10-CM | POA: Diagnosis present

## 2020-03-30 DIAGNOSIS — Z841 Family history of disorders of kidney and ureter: Secondary | ICD-10-CM | POA: Diagnosis not present

## 2020-03-30 DIAGNOSIS — N186 End stage renal disease: Secondary | ICD-10-CM

## 2020-03-30 DIAGNOSIS — H35033 Hypertensive retinopathy, bilateral: Secondary | ICD-10-CM | POA: Diagnosis present

## 2020-03-30 DIAGNOSIS — E113519 Type 2 diabetes mellitus with proliferative diabetic retinopathy with macular edema, unspecified eye: Secondary | ICD-10-CM | POA: Diagnosis present

## 2020-03-30 DIAGNOSIS — Z7982 Long term (current) use of aspirin: Secondary | ICD-10-CM | POA: Diagnosis not present

## 2020-03-30 DIAGNOSIS — A419 Sepsis, unspecified organism: Secondary | ICD-10-CM

## 2020-03-30 DIAGNOSIS — Z87891 Personal history of nicotine dependence: Secondary | ICD-10-CM | POA: Diagnosis not present

## 2020-03-30 DIAGNOSIS — I5032 Chronic diastolic (congestive) heart failure: Secondary | ICD-10-CM | POA: Diagnosis present

## 2020-03-30 DIAGNOSIS — J9601 Acute respiratory failure with hypoxia: Secondary | ICD-10-CM | POA: Diagnosis present

## 2020-03-30 DIAGNOSIS — Z833 Family history of diabetes mellitus: Secondary | ICD-10-CM | POA: Diagnosis not present

## 2020-03-30 DIAGNOSIS — E785 Hyperlipidemia, unspecified: Secondary | ICD-10-CM | POA: Diagnosis present

## 2020-03-30 DIAGNOSIS — A4189 Other specified sepsis: Secondary | ICD-10-CM | POA: Diagnosis present

## 2020-03-30 DIAGNOSIS — R6520 Severe sepsis without septic shock: Secondary | ICD-10-CM

## 2020-03-30 DIAGNOSIS — E7800 Pure hypercholesterolemia, unspecified: Secondary | ICD-10-CM | POA: Diagnosis present

## 2020-03-30 LAB — ABO/RH: ABO/RH(D): O POS

## 2020-03-30 LAB — C-REACTIVE PROTEIN: CRP: 3.3 mg/dL — ABNORMAL HIGH (ref <1.0)

## 2020-03-30 LAB — LACTIC ACID, PLASMA: Lactic Acid, Venous: 0.9 mmol/L (ref 0.5–1.9)

## 2020-03-30 LAB — D-DIMER, QUANTITATIVE: D-Dimer, Quant: 0.67 ug/mL-FEU — ABNORMAL HIGH (ref 0.00–0.50)

## 2020-03-30 LAB — PROCALCITONIN: Procalcitonin: 0.68 ng/mL

## 2020-03-30 LAB — CBG MONITORING, ED
Glucose-Capillary: 251 mg/dL — ABNORMAL HIGH (ref 70–99)
Glucose-Capillary: 376 mg/dL — ABNORMAL HIGH (ref 70–99)

## 2020-03-30 LAB — HIV ANTIBODY (ROUTINE TESTING W REFLEX): HIV Screen 4th Generation wRfx: NONREACTIVE

## 2020-03-30 LAB — GLUCOSE, CAPILLARY
Glucose-Capillary: 189 mg/dL — ABNORMAL HIGH (ref 70–99)
Glucose-Capillary: 241 mg/dL — ABNORMAL HIGH (ref 70–99)

## 2020-03-30 LAB — FERRITIN: Ferritin: 1855 ng/mL — ABNORMAL HIGH (ref 24–336)

## 2020-03-30 LAB — PHOSPHORUS: Phosphorus: 4.5 mg/dL (ref 2.5–4.6)

## 2020-03-30 LAB — SARS CORONAVIRUS 2 BY RT PCR (HOSPITAL ORDER, PERFORMED IN ~~LOC~~ HOSPITAL LAB): SARS Coronavirus 2: POSITIVE — AB

## 2020-03-30 LAB — FIBRINOGEN: Fibrinogen: 491 mg/dL — ABNORMAL HIGH (ref 210–475)

## 2020-03-30 MED ORDER — METOPROLOL TARTRATE 12.5 MG HALF TABLET
12.5000 mg | ORAL_TABLET | Freq: Two times a day (BID) | ORAL | Status: DC
  Administered 2020-03-30 – 2020-04-02 (×7): 12.5 mg via ORAL
  Filled 2020-03-30 (×7): qty 1

## 2020-03-30 MED ORDER — AMLODIPINE BESYLATE 5 MG PO TABS
5.0000 mg | ORAL_TABLET | Freq: Every day | ORAL | Status: DC
  Administered 2020-03-30 – 2020-04-02 (×4): 5 mg via ORAL
  Filled 2020-03-30 (×4): qty 1

## 2020-03-30 MED ORDER — HEPARIN SODIUM (PORCINE) 5000 UNIT/ML IJ SOLN
5000.0000 [IU] | Freq: Three times a day (TID) | INTRAMUSCULAR | Status: DC
  Administered 2020-03-30 – 2020-04-02 (×9): 5000 [IU] via SUBCUTANEOUS
  Filled 2020-03-30 (×9): qty 1

## 2020-03-30 MED ORDER — POLYETHYLENE GLYCOL 3350 17 G PO PACK: 17.0000 g | PACK | Freq: Every day | ORAL | Status: DC | PRN

## 2020-03-30 MED ORDER — HYDRALAZINE HCL 20 MG/ML IJ SOLN
5.0000 mg | Freq: Four times a day (QID) | INTRAMUSCULAR | Status: DC | PRN
  Administered 2020-04-02: 5 mg via INTRAVENOUS
  Filled 2020-03-30 (×3): qty 1

## 2020-03-30 MED ORDER — ENOXAPARIN SODIUM 40 MG/0.4ML ~~LOC~~ SOLN: 40.0000 mg | SUBCUTANEOUS | Status: DC

## 2020-03-30 MED ORDER — CHLORHEXIDINE GLUCONATE CLOTH 2 % EX PADS
6.0000 | MEDICATED_PAD | Freq: Every day | CUTANEOUS | Status: DC
  Administered 2020-03-31 – 2020-04-01 (×2): 6 via TOPICAL

## 2020-03-30 MED ORDER — DEXAMETHASONE SODIUM PHOSPHATE 10 MG/ML IJ SOLN
6.0000 mg | Freq: Every day | INTRAMUSCULAR | Status: DC
  Administered 2020-03-30: 6 mg via INTRAVENOUS

## 2020-03-30 MED ORDER — BENZONATATE 100 MG PO CAPS
200.0000 mg | ORAL_CAPSULE | Freq: Three times a day (TID) | ORAL | Status: DC
  Administered 2020-03-30 – 2020-04-02 (×9): 200 mg via ORAL
  Filled 2020-03-30 (×9): qty 2

## 2020-03-30 MED ORDER — ROSUVASTATIN CALCIUM 20 MG PO TABS
20.0000 mg | ORAL_TABLET | Freq: Every day | ORAL | Status: DC
  Administered 2020-03-30 – 2020-03-31 (×2): 20 mg via ORAL
  Filled 2020-03-30 (×2): qty 1

## 2020-03-30 MED ORDER — METHYLPREDNISOLONE SODIUM SUCC 40 MG IJ SOLR
40.0000 mg | Freq: Two times a day (BID) | INTRAMUSCULAR | Status: DC
  Administered 2020-03-30 – 2020-04-01 (×6): 40 mg via INTRAVENOUS
  Filled 2020-03-30 (×6): qty 1

## 2020-03-30 MED ORDER — ACETAMINOPHEN 325 MG PO TABS
650.0000 mg | ORAL_TABLET | ORAL | Status: DC | PRN
  Administered 2020-03-30 (×2): 650 mg via ORAL
  Filled 2020-03-30 (×2): qty 2

## 2020-03-30 MED ORDER — HEPARIN SODIUM (PORCINE) 1000 UNIT/ML DIALYSIS
2000.0000 [IU] | Freq: Once | INTRAMUSCULAR | Status: DC
  Filled 2020-03-30: qty 2

## 2020-03-30 MED ORDER — HYDROCOD POLST-CPM POLST ER 10-8 MG/5ML PO SUER
5.0000 mL | Freq: Two times a day (BID) | ORAL | Status: DC | PRN
  Administered 2020-03-30: 5 mL via ORAL
  Filled 2020-03-30: qty 5

## 2020-03-30 MED ORDER — DOXERCALCIFEROL 4 MCG/2ML IV SOLN
3.0000 ug | INTRAVENOUS | Status: DC
  Administered 2020-04-01: 3 ug via INTRAVENOUS
  Filled 2020-03-30 (×2): qty 2

## 2020-03-30 MED ORDER — INSULIN GLARGINE 100 UNIT/ML ~~LOC~~ SOLN
8.0000 [IU] | Freq: Every day | SUBCUTANEOUS | Status: DC
  Administered 2020-03-30 – 2020-04-02 (×4): 8 [IU] via SUBCUTANEOUS
  Filled 2020-03-30 (×6): qty 0.08

## 2020-03-30 MED ORDER — ALBUTEROL SULFATE HFA 108 (90 BASE) MCG/ACT IN AERS
2.0000 | INHALATION_SPRAY | RESPIRATORY_TRACT | Status: DC | PRN
  Filled 2020-03-30: qty 6.7

## 2020-03-30 MED ORDER — ONDANSETRON HCL 4 MG/2ML IJ SOLN: 4.0000 mg | Freq: Four times a day (QID) | INTRAMUSCULAR | Status: DC | PRN

## 2020-03-30 MED ORDER — INSULIN ASPART 100 UNIT/ML ~~LOC~~ SOLN
0.0000 [IU] | Freq: Three times a day (TID) | SUBCUTANEOUS | Status: DC
  Administered 2020-03-30: 5 [IU] via SUBCUTANEOUS
  Administered 2020-03-30: 9 [IU] via SUBCUTANEOUS

## 2020-03-30 MED ORDER — INSULIN ASPART 100 UNIT/ML ~~LOC~~ SOLN
0.0000 [IU] | Freq: Three times a day (TID) | SUBCUTANEOUS | Status: DC
  Administered 2020-03-30: 4 [IU] via SUBCUTANEOUS
  Administered 2020-03-31: 7 [IU] via SUBCUTANEOUS
  Administered 2020-03-31: 20 [IU] via SUBCUTANEOUS
  Administered 2020-03-31: 4 [IU] via SUBCUTANEOUS
  Administered 2020-04-01: 7 [IU] via SUBCUTANEOUS
  Administered 2020-04-01 – 2020-04-02 (×2): 15 [IU] via SUBCUTANEOUS
  Administered 2020-04-02: 20 [IU] via SUBCUTANEOUS

## 2020-03-30 NOTE — Consult Note (Signed)
Renal Service Consult Note [facility] 03/30/2020 [place] Requesting Physician: Nena Alexander   Reason for Consult:  ESRD pt w/ COVID+ pna HPI: The patient is a 49 y.o. year-old w/ hx of DM w/ retinopathy, HTN, HL, ESRD on HD (bx 2018+ severe DM + HTN changes), CHF presented to ED yest w/ fevers sent from dialysis center. He tested + at his HD center around 8/23 for COVID.  Was vaccinated w/ Pfizer x 2 in April. Last HD was 8/24. In ED temp 101.6, RR ^, BP's up. Placed on 4L [place]. CXR patchy infiltrates c/w COVID pna.  Pt admitted. Asked to see for esrd.   Pt c/o some fevers, cough , diarrhea, mild SOB.  No CP or abd pain. No recent HD problems or access issues. Does not miss HD. Usually he comes to HD around "sesenta" which is 70kg and leaves around 66-67kg.   Pt is from [place], lives in [place] w/ his wife and one child.  He has been in [place] about 20 yrs. occ etoh, no tob, no drugs.    ROS  denies CP  no joint pain   no HA  no blurry vision  no rash  no nausea/ vomiting   Past Medical History  Past Medical History:  Diagnosis Date  . Acute respiratory failure with hypoxia ([place]) 09/07/2018  . Anemia of chronic kidney failure 03/11/2017  . Cataracts, bilateral 2019  . Chest pain 09/01/2018  . CHF (congestive heart failure) ([place])   . Chronic kidney disease 03/11/2017  . Diabetes mellitus without complication ([place]) 2761  . Edema 01/14/2017  . Headache   . Hypercholesterolemia   . Hypertension 03/11/2017  . Hypertensive retinopathy of both eyes 2019  . Iron deficiency anemia 06/18/2016  . Pneumonia 09/07/2018  . Proliferative diabetic retinopathy with macular edema ([place]) 2019   Retinal hemorrhage, bilateral and left vitreous hemorrhage.  Being treated with Lucentis.  Dr. Iona Hansen  . RSV (respiratory syncytial virus infection) 08/2017   Past Surgical History  Past Surgical History:  Procedure Laterality Date  . A/V FISTULAGRAM Left 01/19/2018    Procedure: A/V FISTULAGRAM - Left AV;  Surgeon: Waynetta Sandy, MD;  Location: [facility];  Service: Cardiovascular;  Laterality: Left;  . AV FISTULA PLACEMENT Left 08/09/2017   Procedure: LEFT RADIOCEPHALIC ARTERIOVENOUS (AV) FISTULA CREATION;  Surgeon: [name], MD;  Location: [facility] OR;  Service: Vascular;  Laterality: Left;  . AV FISTULA PLACEMENT Left 02/02/2018   Procedure: ARTERIOVENOUS (AV) FISTULA CREATION LEFT UPPER EXTREMITY;  Surgeon: [name], MD;  Location: [facility];  Service: Vascular;  Laterality: Left;  . INSERTION OF DIALYSIS CATHETER  08/09/2017  . INSERTION OF DIALYSIS CATHETER Right 08/09/2017   Procedure: INSERTION OF DIALYSIS CATHETER RIGHT INTERNAL JUGULAR;  Surgeon: [name], MD;  [facility];  Service: Vascular;  Laterality: Right;  . IR REMOVAL TUN CV CATH W/O FL  07/01/2018  . LEFT HEART CATH AND CORONARY ANGIOGRAPHY N/A 09/05/2018   Procedure: LEFT HEART CATH AND CORONARY ANGIOGRAPHY;  Surgeon: [name], MD;  Location: [facility];  Service: Cardiovascular;  Laterality: N/A;   Family History  Family History  Problem Relation Age of Onset  . Diabetes Mother   . Kidney disease Mother        renal failure--diabetes, cause of death  . Hypertension Mother   . Diabetes Father   . Kidney disease Father  Cause of death:  kidney failure form diabetes  . Alcohol abuse Father   . Diabetes Sister    Social History  reports that he has quit smoking. His smoking use included cigarettes. He has never used smokeless tobacco. He reports previous alcohol use. He reports that he does not use drugs. Allergies No Known Allergies Home medications Prior to Admission medications   Medication Sig Start Date End Date Taking? Authorizing Provider  AgaMatrix Ultra-Thin Lancets MISC Check blood glucose twice daily before meals. 10/18/19   Mack Hook, MD  amLODipine (NORVASC) 5 MG tablet Take 1 tablet by mouth once daily with  breakfast 10/18/19   Mack Hook, MD  aspirin EC 81 MG EC tablet Take 1 tablet (81 mg total) by mouth daily. Patient not taking: Reported on 10/18/2019 09/08/18   Mina Marble P, DO  b complex-vitamin c-folic acid (NEPHRO-VITE) 0.8 MG TABS tablet Take 1 tablet by mouth daily.  08/11/18   [provider]  Blood Glucose Monitoring Suppl (AGAMATRIX PRESTO) w/Device KIT Check sugars twice daily Patient not taking: Reported on 10/18/2019 06/04/16   Mack Hook, MD  glipiZIDE (GLUCOTROL) 5 MG tablet Take 1 tablet (5 mg total) by mouth 2 (two) times daily with a meal. 10/18/19   Mack Hook, MD  glucose blood (AGAMATRIX PRESTO TEST) test strip Check sugars twice daily 10/18/19   Mack Hook, MD  metoprolol tartrate (LOPRESSOR) 25 MG tablet TAKE 1/2 (ONE-HALF) TABLET BY MOUTH TWICE DAILY WITH MEALS 10/18/19   Mack Hook, MD  rosuvastatin (CRESTOR) 20 MG tablet Take 1 tablet (20 mg total) by mouth daily at 6 PM. 10/18/19   Mack Hook, MD  sucroferric oxyhydroxide (VELPHORO) 500 MG chewable tablet Chew 2 tablets (1,000 mg total) by mouth 3 (three) times daily with meals. 10/18/19   Mack Hook, MD     Vitals:   03/30/20 0445 03/30/20 0655 03/30/20 0715 03/30/20 0730  BP: (!) 176/83  (!) 192/92 (!) 155/78  Pulse:   75 63  Resp: (!) 21  14 (!) 23  Temp:  99 F (37.2 C)    TempSrc:  Oral    SpO2:   95% 96%  Weight:      Height:       Exam Gen alert, no distress, nasal O2 No rash, cyanosis or gangrene Sclera anicteric, throat clear  No jvd or bruits Chest soft rales bilat bases 1/3 up, no wheezing RRR no MRG Abd soft ntnd no mass or ascites +bs GU normal male MS no joint effusions or deformity Ext no L edema, no wounds or ulcers  Neuro is alert, Ox 3 , nf LUA AVF+bruit    Home meds:  - asa 81/ norvasc 5/ metoprolol 12.5 bid/ crestor 20 hs  - glipizide 44m bid  - velphoro 1 gm tid ac  - prn's/ vitamins/ supplements     OP HD:  [facility]  3h 448m  400/800  66.5kg  2/2 bath  Hep 2000    - last HD 8/27 1.5 h came off at 67.5kg, before that was 8/24  - good compliance  - hect 3  - no esa / fe  CXR 8/27 - IMPRESSION:Patchy airspace opacities consistent with the given clinical history.  Labs 8/27 yest > Na 136  K 4.5  BUN 46  Cr 10.4  Ca 8.7  Alb 3.5  LFT's ok,  CRP 3.3  LDH/ TG/ lactic/ PC wnl    WBC 6K  Hb 9.5  plt 125  Assessment/ Plan: 1. COVID +pna - getting IV remdesivir and solumedrol.  CXR+ infiltrates. No sig vol excess. Per pmd 2. ESRD - on HD TTS.  Plan HD later today.  3. HTN/ vol - cont home BP meds, UF 2-3 L is his usual. No sig vol overload on exam.  4. DM on oral agents at home 5. Anemia ckd - not on esa at op center, monitor 6. MBD ckd - Ca 8.7, check phos      Kelly Splinter  MD 03/30/2020, 7:36 AM  Recent Labs  Lab 03/29/20 2135  WBC 6.7  HGB 9.5*   Recent Labs  Lab 03/29/20 2135  K 4.5  BUN 46*  CREATININE 10.43*  CALCIUM 8.7*

## 2020-03-30 NOTE — Progress Notes (Signed)
PROGRESS NOTE                                                                                                                                                                                                             Patient Demographics:    Blake Santana, is a 49 y.o. male, DOB - 1970/11/19, WJX:[id number]  Outpatient Primary MD for the patient is Mack Hook, MD   Admit date - 03/29/2020   LOS - 0  Chief Complaint  Patient presents with  . Fever       Brief Narrative: Patient is a 49 y.o. male with PMHx of ESRD on HD TTS, DM-2, HTN-presenting with cough, body aches, fatigue and gradually worsening shortness of breath since 8/22-he apparently was tested positive for COVID-19 on 8/24.  He was evaluated in the emergency room and found to have acute hypoxic respiratory failure secondary COVID-19 pneumonia.  COVID-19 vaccinated status: Vaccinated Visual merchandiser doses-April 2021)  Significant Events: 8/27>> Admit to [facility] for hypoxia due to COVID-19 pneumonia  Significant studies: 8/27>>Chest x-ray: Patchy airspace opacities consistent with COVID-19 pneumonia  COVID-19 medications: Steroids: 8/27>> Remdesivir: 8/27>>  Antibiotics: None  Microbiology data: 8/27 >>blood culture: No growth  Procedures: None  Consults: Nephrology  DVT prophylaxis: heparin injection 5,000 Units Start: 03/30/20 1400    Subjective:    Gar Ponto today feels better-he was titrated down to 2 L of oxygen this morning when I was in the room.  Some cough but appears very comfortable at rest.   Assessment  & Plan :   Acute Hypoxic Resp Failure due to breakthrough COVID-19 infection with pneumonia: Appears to have mild disease-fully vaccinated this past April.  Continue steroids/remdesivir.  If hypoxia worsens-we will need to start Actemra.  Do not think patient had sepsis physiology-sepsis ruled out  Fever:  afebrile O2 requirements:  SpO2: 94 % O2 Flow Rate (L/min): 2 L/min   COVID-19 Labs: Recent Labs    03/29/20 2300  DDIMER 0.67*  FERRITIN 1,855*  LDH 186  CRP 3.3*       Component Value Date/Time   BNP 2,559.3 (H) 11/22/2018 1018    Recent Labs  Lab 03/29/20 2300  PROCALCITON 0.68    Lab Results  Component Value Date   SARSCOV2NAA POSITIVE (A) 03/29/2020   SARSCOV2NAA POSITIVE (A) 11/22/2018   SARSCOV2NAA  NEGATIVE 11/22/2018     Prone/Incentive Spirometry: encouraged incentive spirometry use 3-4/hour.  Thrombocytopenia: Mild-likely secondary to COVID-19-follow.  Anemia likely secondary to ESRD: Defer to nephrology-no indication to transfuse at this point  ESRD: On HD TTS-nephrology following and directing care  HTN: BP on the high side-continue amlodipine, metoprolol-monitor for another 24 hours before adjusting.  Should improve with HD.  HLD: Continue statin  DM-2: Hyperglycemia secondary to steroids-change SSI to resistant scale-add 8 units of Lantus.  Follow with A1c and CBGs.  Optimize accordingly   Recent Labs    03/30/20 0826 03/30/20 1149  GLUCAP 251* 376*     ABG:    Component Value Date/Time   PHART 7.383 11/26/2018 0310   PCO2ART 45.1 11/26/2018 0310   PO2ART 87.0 11/26/2018 0310   HCO3 26.9 11/26/2018 0310   TCO2 28 11/26/2018 0310   O2SAT 97.0 11/26/2018 0310    Vent Settings: N/A  Condition - Guarded  Family Communication  : Called spouse-[phone]-no response.  Code Status :  Full Code  Diet :  Diet Order            Diet renal with fluid restriction Fluid restriction: 1200 mL Fluid; Room service appropriate? Yes; Fluid consistency: Thin  Diet effective now                  Disposition Plan  :   Status is: Inpatient  Remains inpatient appropriate because:Inpatient level of care appropriate due to severity of illness   Dispo: The patient is from: Home              Anticipated d/c is to: Home               Anticipated d/c date is: > 3 days              Patient currently is not medically stable to d/c.  Barriers to discharge: Hypoxia requiring O2 supplementation/complete 5 days of IV Remdesivir  Antimicorbials  :    Anti-infectives (From admission, onward)   Start     Dose/Rate Route Frequency Ordered Stop   03/30/20 1000  remdesivir 100 mg in sodium chloride 0.9 % 100 mL IVPB       "Followed by" Linked Group Details   100 mg 200 mL/hr over 30 Minutes Intravenous Daily 03/29/20 2244 04/03/20 0959   03/29/20 2330  remdesivir 200 mg in sodium chloride 0.9% 250 mL IVPB       "Followed by" Linked Group Details   200 mg 580 mL/hr over 30 Minutes Intravenous Once 03/29/20 2244 03/30/20 0158      Inpatient Medications  Scheduled Meds: . amLODipine  5 mg Oral Daily  . benzonatate  200 mg Oral TID  . Chlorhexidine Gluconate Cloth  6 each Topical Q0600  . doxercalciferol  3 mcg Intravenous Q T,Th,Sa-HD  . heparin  2,000 Units Dialysis Once in dialysis  . heparin injection (subcutaneous)  5,000 Units Subcutaneous Q8H  . insulin aspart  0-9 Units Subcutaneous TID WC  . methylPREDNISolone (SOLU-MEDROL) injection  40 mg Intravenous Q12H  . metoprolol tartrate  12.5 mg Oral BID  . rosuvastatin  20 mg Oral q1800   Continuous Infusions: . remdesivir 100 mg in NS 100 mL Stopped (03/30/20 1255)   PRN Meds:.acetaminophen, albuterol, chlorpheniramine-HYDROcodone, hydrALAZINE, ondansetron (ZOFRAN) IV, polyethylene glycol   Time Spent in minutes  25  See all Orders from today for further details   Oren Binet M.D on 03/30/2020 at 3:18 PM  To page go to [email or web address] - use universal password  [facility] -  Office  [phone]    Objective:   Vitals:   03/30/20 0730 03/30/20 1100 03/30/20 1130 03/30/20 1300  BP: (!) 155/78 (!) 188/91 (!) 194/89 (!) 179/93  Pulse: 63 80 84 72  Resp: (!) 23 (!) 22 (!) 21 (!) 26  Temp:  98.4 F (36.9 C)    TempSrc:  Oral    SpO2: 96% 93% 97%  94%  Weight:      Height:        Wt Readings from Last 3 Encounters:  03/29/20 67 kg  10/18/19 66.2 kg  12/01/18 63.3 kg    No intake or output data in the 24 hours ending 03/30/20 1518   Physical Exam Gen Exam:Alert awake-not in any distress HEENT:atraumatic, normocephalic Chest: B/L clear to auscultation anteriorly CVS:S1S2 regular Abdomen:soft non tender, non distended Extremities:no edema Neurology: Non focal Skin: no rash   Data Review:    CBC Recent Labs  Lab 03/29/20 2135  WBC 6.7  HGB 9.5*  HCT 30.0*  PLT 125*  MCV 97.1  MCH 30.7  MCHC 31.7  RDW 15.7*  LYMPHSABS 1.4  MONOABS 0.7  EOSABS 0.0  BASOSABS 0.0    Chemistries  Recent Labs  Lab 03/29/20 2135 03/29/20 2300  NA 136  --   K 4.5  --   CL 94*  --   CO2 26  --   GLUCOSE 77  --   BUN 46*  --   CREATININE 10.43*  --   CALCIUM 8.7*  --   AST  --  16  ALT  --  15  ALKPHOS  --  115  BILITOT  --  1.1   ------------------------------------------------------------------------------------------------------------------ Recent Labs    03/29/20 2300  TRIG 115    Lab Results  Component Value Date   HGBA1C 7.0 (H) 11/15/2019   ------------------------------------------------------------------------------------------------------------------ No results for input(s): TSH, T4TOTAL, T3FREE, THYROIDAB in the last 72 hours.  Invalid input(s): FREET3 ------------------------------------------------------------------------------------------------------------------ Recent Labs    03/29/20 2300  FERRITIN 1,855*    Coagulation profile No results for input(s): INR, PROTIME in the last 168 hours.  Recent Labs    03/29/20 2300  DDIMER 0.67*    Cardiac Enzymes No results for input(s): CKMB, TROPONINI, MYOGLOBIN in the last 168 hours.  Invalid input(s): CK ------------------------------------------------------------------------------------------------------------------    Component Value  Date/Time   BNP 2,559.3 (H) 11/22/2018 1018    Micro Results Recent Results (from the past 240 hour(s))  SARS Coronavirus 2 by RT PCR (hospital order, performed in [facility] hospital lab) Nasopharyngeal Nasopharyngeal Swab     Status: Abnormal   Collection Time: 03/29/20 10:44 PM   Specimen: Nasopharyngeal Swab  Result Value Ref Range Status   SARS Coronavirus 2 POSITIVE (A) NEGATIVE Final    Comment: RESULT CALLED TO, READ BACK BY AND VERIFIED WITH: B OLDHAM RN 03/30/20 0015 JDW (NOTE) SARS-CoV-2 target nucleic acids are DETECTED  SARS-CoV-2 RNA is generally detectable in upper respiratory specimens  during the acute phase of infection.  Positive results are indicative  of the presence of the identified virus, but do not rule out bacterial infection or co-infection with other pathogens not detected by the test.  Clinical correlation with patient history and  other diagnostic information is necessary to determine patient infection status.  The expected result is negative.  Fact Sheet for Patients:   StrictlyIdeas.no   Fact Sheet for Healthcare Providers:   BankingDealers.co.za  This test is not yet approved or cleared by the [place] and  has been authorized for detection and/or diagnosis of SARS-CoV-2 by FDA under an Emergency Use Authorization (EUA).  This EUA will remain in effect (meaning this test can  be used) for the duration of  the COVID-19 declaration under Section 564(b)(1) of the Act, 21 U.S.C. section 360-bbb-3(b)(1), unless the authorization is terminated or revoked sooner.  Performed at [facility], [place] [address], [place], [place] [address]   Blood Culture (routine x 2)     Status: None (Preliminary result)   Collection Time: 03/29/20 10:50 PM   Specimen: BLOOD RIGHT FOREARM  Result Value Ref Range Status   Specimen Description BLOOD RIGHT FOREARM  Final   Special Requests   Final     BOTTLES DRAWN AEROBIC AND ANAEROBIC Blood Culture adequate volume   Culture   Final    NO GROWTH < 12 HOURS Performed at [facility], [place] [address], [place], [place] [address]    Report Status PENDING  Incomplete    Radiology Reports DG Chest Portable 1 View  Result Date: 03/29/2020 CLINICAL DATA:  Shortness of breath, COVID-19 positivity EXAM: PORTABLE CHEST 1 VIEW COMPARISON:  11/26/2018 FINDINGS: Cardiac shadow is enlarged but stable. Patchy airspace opacities are noted bilaterally consistent with the given clinical history. No sizable effusion is seen. No bony abnormality is noted. IMPRESSION: Patchy airspace opacities consistent with the given clinical history. Electronically Signed   By: Inez Catalina M.D.   On: 03/29/2020 21:51

## 2020-03-30 NOTE — ED Notes (Signed)
Lunch provided.

## 2020-03-30 NOTE — H&P (Signed)
History and Physical    Blake Santana CWC:[id number] DOB: 08-Oct-1970 DOA: 03/29/2020  PCP: Mack Hook, MD  Patient coming from: Home  I have personally briefly reviewed patient's old medical records in [place]  Chief Complaint: fever  HPI: Blake Santana is a 49 y.o. 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  Patient started to have symptoms of coughing productive of sputum and some mild shortness of breath about 5 days ago.  He then got tested for Covid at his dialysis center and was found to be positive. Had 2 doses of COVID vaccine back in April. His dialysis day was somehow moved to today but he was noted to have a fever he was sent to ED instead.  Last dialysis today was Tuesday. He denies any nausea, vomiting or diarrhea.  No chest pain but has noticed some left-sided scapular pain.  ED Course: He was febrile up to 101.6, tachypneic and hypertensive up to 202/88 and placed on 4 L via nasal cannula. CBC with no leukocytosis, stable normocytic anemia with hemoglobin 9.5. Mild thrombocytopenia with platelet of 125. Creatinine of 10.43 with unclear baseline. Anion gap of 16 which is somewhat chronic for him.  Chest x-ray showing patchy airspace opacity consistent with Covid infection.  ED PA has discussed case with nephrology and patient will be taken for dialysis in the morning.  Review of Systems:  Constitutional: No Weight Change, + Fever ENT/Mouth: No sore throat, No Rhinorrhea Eyes: No Eye Pain, No Vision Changes Cardiovascular: No Chest Pain, + SOB Respiratory: + Cough, + Sputum, No Wheezing, no Dyspnea  Gastrointestinal: No Nausea, No Vomiting, No Diarrhea, No Constipation, No Pain Genitourinary: no Urinary Incontinence Musculoskeletal: No Arthralgias, No Myalgias Skin: No Skin Lesions, No  Pruritus, Neuro: no Weakness, No Numbness Psych: No Anxiety/Panic, No Depression, no decrease appetite Heme/Lymph: No Bruising, No Bleeding Past Medical History:  Diagnosis Date  . Acute respiratory failure with hypoxia ([place]) 09/07/2018  . Anemia of chronic kidney failure 03/11/2017  . Cataracts, bilateral 2019  . Chest pain 09/01/2018  . CHF (congestive heart failure) ([place])   . Chronic kidney disease 03/11/2017  . Diabetes mellitus without complication ([place]) 7616  . Edema 01/14/2017  . Headache   . Hypercholesterolemia   . Hypertension 03/11/2017  . Hypertensive retinopathy of both eyes 2019  . Iron deficiency anemia 06/18/2016  . Pneumonia 09/07/2018  . Proliferative diabetic retinopathy with macular edema ([place]) 2019   Retinal hemorrhage, bilateral and left vitreous hemorrhage.  Being treated with Lucentis.  Dr. Iona Hansen  . RSV (respiratory syncytial virus infection) 08/2017    Past Surgical History:  Procedure Laterality Date  . A/V FISTULAGRAM Left 01/19/2018   Procedure: A/V FISTULAGRAM - Left AV;  Surgeon: Waynetta Sandy, MD;  Location: [facility];  Service: Cardiovascular;  Laterality: Left;  . AV FISTULA PLACEMENT Left 08/09/2017   Procedure: LEFT RADIOCEPHALIC ARTERIOVENOUS (AV) FISTULA CREATION;  Surgeon: [name], MD;  Location: [facility] OR;  Service: Vascular;  Laterality: Left;  . AV FISTULA PLACEMENT Left 02/02/2018   Procedure: ARTERIOVENOUS (AV) FISTULA CREATION LEFT UPPER EXTREMITY;  Surgeon: [name], MD;  Location: [facility];  Service: Vascular;  Laterality: Left;  . INSERTION OF DIALYSIS CATHETER  08/09/2017  . INSERTION OF DIALYSIS CATHETER Right 08/09/2017   Procedure: INSERTION OF DIALYSIS CATHETER RIGHT INTERNAL JUGULAR;  Surgeon:  [name], MD;  Location: [facility] OR;  Service: Vascular;  Laterality: Right;  . IR REMOVAL TUN CV CATH W/O FL  07/01/2018  . LEFT HEART CATH AND CORONARY ANGIOGRAPHY N/A 09/05/2018   Procedure: LEFT HEART CATH AND CORONARY  ANGIOGRAPHY;  Surgeon: [name], MD;  Location: [place] [facility];  Service: Cardiovascular;  Laterality: N/A;     reports that he has quit smoking. His smoking use included cigarettes. He has never used smokeless tobacco. He reports previous alcohol use. He reports that he does not use drugs. Social History  No Known Allergies  Family History  Problem Relation Age of Onset  . Diabetes Mother   . Kidney disease Mother        renal failure--diabetes, cause of death  . Hypertension Mother   . Diabetes Father   . Kidney disease Father        Cause of death:  kidney failure form diabetes  . Alcohol abuse Father   . Diabetes Sister      Prior to Admission medications   Medication Sig Start Date End Date Taking? Authorizing Provider  AgaMatrix Ultra-Thin Lancets MISC Check blood glucose twice daily before meals. 10/18/19   Mack Hook, MD  amLODipine (NORVASC) 5 MG tablet Take 1 tablet by mouth once daily with breakfast 10/18/19   Mack Hook, MD  aspirin EC 81 MG EC tablet Take 1 tablet (81 mg total) by mouth daily. Patient not taking: Reported on 10/18/2019 09/08/18   Mina Marble P, DO  b complex-vitamin c-folic acid (NEPHRO-VITE) 0.8 MG TABS tablet Take 1 tablet by mouth daily.  08/11/18   [provider]  Blood Glucose Monitoring Suppl (AGAMATRIX PRESTO) w/Device KIT Check sugars twice daily Patient not taking: Reported on 10/18/2019 06/04/16   Mack Hook, MD  glipiZIDE (GLUCOTROL) 5 MG tablet Take 1 tablet (5 mg total) by mouth 2 (two) times daily with a meal. 10/18/19   Mack Hook, MD  glucose blood (AGAMATRIX PRESTO TEST) test strip Check sugars twice daily 10/18/19   Mack Hook, MD  metoprolol tartrate (LOPRESSOR) 25 MG tablet TAKE 1/2 (ONE-HALF) TABLET BY MOUTH TWICE DAILY WITH MEALS 10/18/19   Mack Hook, MD  rosuvastatin (CRESTOR) 20 MG tablet Take 1 tablet (20 mg total) by mouth daily at 6 PM. 10/18/19    Mack Hook, MD  sucroferric oxyhydroxide (VELPHORO) 500 MG chewable tablet Chew 2 tablets (1,000 mg total) by mouth 3 (three) times daily with meals. 10/18/19   Mack Hook, MD    Physical Exam: Vitals:   03/29/20 2245 03/29/20 2300 03/29/20 2355 03/29/20 2356  BP: (!) 202/81 (!) 206/92 (!) 202/88   Pulse: 99 100  95  Resp: (!) 21 (!) 29  20  Temp:      TempSrc:      SpO2: 94% 91%  96%  Weight:      Height:        Constitutional: NAD, calm, comfortable, nontoxic male laying at 30 degree incline in bed Vitals:   03/29/20 2245 03/29/20 2300 03/29/20 2355 03/29/20 2356  BP: (!) 202/81 (!) 206/92 (!) 202/88   Pulse: 99 100  95  Resp: (!) 21 (!) 29  20  Temp:      TempSrc:      SpO2: 94% 91%  96%  Weight:      Height:       Eyes: PERRL, lids and conjunctivae normal ENMT: Mucous membranes are moist.  Neck: normal, supple Respiratory: clear to  auscultation bilaterally, no wheezing, no crackles. Normal respiratory effort on 4 L via nasal cannula Cardiovascular: Regular rate and rhythm, no murmurs / rubs / gallops. No extremity edema. 2+ pedal pulses. Left upper extremity AV fistula with palpable thrill  abdomen: no tenderness, no masses palpated.  Bowel sounds positive.  Musculoskeletal: no clubbing / cyanosis. No joint deformity upper and lower extremities. Good ROM, no contractures. Normal muscle tone. Mild pain with palpation of left paraspinal upper thoracic musculature medial to the left scapula. Skin: no rashes, lesions, ulcers. No induration Neurologic: CN 2-12 grossly intact. Sensation intact,  Strength 5/5 in all 4.  Psychiatric: Normal judgment and insight. Alert and oriented x 3. Normal mood.    Labs on Admission: I have personally reviewed following labs and imaging studies  CBC: Recent Labs  Lab 03/29/20 2135  WBC 6.7  NEUTROABS 4.6  HGB 9.5*  HCT 30.0*  MCV 97.1  PLT 453*   Basic Metabolic Panel: Recent Labs  Lab 03/29/20 2135  NA 136   K 4.5  CL 94*  CO2 26  GLUCOSE 77  BUN 46*  CREATININE 10.43*  CALCIUM 8.7*   GFR: Estimated Creatinine Clearance: 6.7 mL/min (A) (by C-G formula based on SCr of 10.43 mg/dL (H)). Liver Function Tests: Recent Labs  Lab 03/29/20 2300  AST 16  ALT 15  ALKPHOS 115  BILITOT 1.1  PROT 7.1  ALBUMIN 3.5   No results for input(s): LIPASE, AMYLASE in the last 168 hours. No results for input(s): AMMONIA in the last 168 hours. Coagulation Profile: No results for input(s): INR, PROTIME in the last 168 hours. Cardiac Enzymes: No results for input(s): CKTOTAL, CKMB, CKMBINDEX, TROPONINI in the last 168 hours. BNP (last 3 results) No results for input(s): PROBNP in the last 8760 hours. HbA1C: No results for input(s): HGBA1C in the last 72 hours. CBG: No results for input(s): GLUCAP in the last 168 hours. Lipid Profile: Recent Labs    03/29/20 2300  TRIG 115   Thyroid Function Tests: No results for input(s): TSH, T4TOTAL, FREET4, T3FREE, THYROIDAB in the last 72 hours. Anemia Panel: No results for input(s): VITAMINB12, FOLATE, FERRITIN, TIBC, IRON, RETICCTPCT in the last 72 hours. Urine analysis:    Component Value Date/Time   COLORURINE YELLOW 08/08/2017 0354   APPEARANCEUR CLOUDY (A) 08/08/2017 0354   APPEARANCEUR Clear 04/01/2017 1107   LABSPEC 1.015 08/08/2017 0354   PHURINE 5.0 08/08/2017 0354   GLUCOSEU 150 (A) 08/08/2017 0354   HGBUR MODERATE (A) 08/08/2017 0354   BILIRUBINUR NEGATIVE 08/08/2017 0354   BILIRUBINUR neg 04/01/2017 1246   BILIRUBINUR Negative 04/01/2017 1107   KETONESUR NEGATIVE 08/08/2017 0354   PROTEINUR >=300 (A) 08/08/2017 0354   UROBILINOGEN 0.2 04/01/2017 1246   NITRITE NEGATIVE 08/08/2017 0354   LEUKOCYTESUR NEGATIVE 08/08/2017 0354   LEUKOCYTESUR Negative 04/01/2017 1107    Radiological Exams on Admission: DG Chest Portable 1 View  Result Date: 03/29/2020 CLINICAL DATA:  Shortness of breath, COVID-19 positivity EXAM: PORTABLE CHEST 1  VIEW COMPARISON:  11/26/2018 FINDINGS: Cardiac shadow is enlarged but stable. Patchy airspace opacities are noted bilaterally consistent with the given clinical history. No sizable effusion is seen. No bony abnormality is noted. IMPRESSION: Patchy airspace opacities consistent with the given clinical history. Electronically Signed   By: Inez Catalina M.D.   On: 03/29/2020 21:51      Assessment/Plan Acute hypoxic respiratory failure secondary to COVID pneumonia Admit On 4L  Maintain O2 > 92%  IV Decadron  Remdesivir Monitor inflammatory  markers  Sepsis secondary to Covid pneumonia Patient was febrile, tachypneic with Covid pneumonia on admission  Hypertension Continue home antihypertensives PRN hydralazine for blood pressure systolic greater than 570  ESRD on HD Tues/Thurs/Sat Last dialysis on Tues, creatining of 10.43.  Nephrology consulted and will take for dialysis in the morning  Type 2 diabetes with retinopathy/nephropathy Hemoglobin A1c of 7 back in April BG is controlled here  Anemia of chronic disease Hemoglobin stable  DVT prophylaxis:.Heparin subcu  code Status: Full Family Communication: Plan discussed with patient at bedside  disposition Plan: Home with at least 2 midnight stays  Consults called: Nephrology Admission status: inpatient  Status is: Inpatient  Remains inpatient appropriate because:Inpatient level of care appropriate due to severity of illness   Dispo: The patient is from: Home              Anticipated d/c is to: Home              Anticipated d/c date is: > 3 days              Patient currently is not medically stable to d/c.         Orene Desanctis DO [facility]   If 7PM-7AM, please contact night-coverage [email or web address]   03/30/2020, 12:01 AM

## 2020-03-30 NOTE — ED Notes (Signed)
Pt provided bedside commode 

## 2020-03-30 NOTE — ED Notes (Signed)
Pt resting and communicating well. No complaints at this time.

## 2020-03-31 DIAGNOSIS — J9600 Acute respiratory failure, unspecified whether with hypoxia or hypercapnia: Secondary | ICD-10-CM | POA: Diagnosis not present

## 2020-03-31 DIAGNOSIS — U071 COVID-19: Secondary | ICD-10-CM | POA: Diagnosis not present

## 2020-03-31 LAB — COMPREHENSIVE METABOLIC PANEL
ALT: 14 U/L (ref 0–44)
AST: 17 U/L (ref 15–41)
Albumin: 3.1 g/dL — ABNORMAL LOW (ref 3.5–5.0)
Alkaline Phosphatase: 113 U/L (ref 38–126)
Anion gap: 21 — ABNORMAL HIGH (ref 5–15)
BUN: 91 mg/dL — ABNORMAL HIGH (ref 6–20)
CO2: 20 mmol/L — ABNORMAL LOW (ref 22–32)
Calcium: 9 mg/dL (ref 8.9–10.3)
Chloride: 92 mmol/L — ABNORMAL LOW (ref 98–111)
Creatinine, Ser: 13.97 mg/dL — ABNORMAL HIGH (ref 0.61–1.24)
GFR calc Af Amer: 4 mL/min — ABNORMAL LOW (ref >60)
GFR calc non Af Amer: 4 mL/min — ABNORMAL LOW (ref >60)
Glucose, Bld: 250 mg/dL — ABNORMAL HIGH (ref 70–99)
Potassium: 6.1 mmol/L — ABNORMAL HIGH (ref 3.5–5.1)
Sodium: 133 mmol/L — ABNORMAL LOW (ref 135–145)
Total Bilirubin: 0.7 mg/dL (ref 0.3–1.2)
Total Protein: 7 g/dL (ref 6.5–8.1)

## 2020-03-31 LAB — CBC
HCT: 30.2 % — ABNORMAL LOW (ref 39.0–52.0)
Hemoglobin: 9.7 g/dL — ABNORMAL LOW (ref 13.0–17.0)
MCH: 30.3 pg (ref 26.0–34.0)
MCHC: 32.1 g/dL (ref 30.0–36.0)
MCV: 94.4 fL (ref 80.0–100.0)
Platelets: 127 10*3/uL — ABNORMAL LOW (ref 150–400)
RBC: 3.2 MIL/uL — ABNORMAL LOW (ref 4.22–5.81)
RDW: 15.4 % (ref 11.5–15.5)
WBC: 7.3 10*3/uL (ref 4.0–10.5)
nRBC: 0 % (ref 0.0–0.2)

## 2020-03-31 LAB — GLUCOSE, CAPILLARY
Glucose-Capillary: 206 mg/dL — ABNORMAL HIGH (ref 70–99)
Glucose-Capillary: 375 mg/dL — ABNORMAL HIGH (ref 70–99)
Glucose-Capillary: 167 mg/dL — ABNORMAL HIGH (ref 70–99)
Glucose-Capillary: 329 mg/dL — ABNORMAL HIGH (ref 70–99)

## 2020-03-31 LAB — HEMOGLOBIN A1C
Hgb A1c MFr Bld: 7.4 % — ABNORMAL HIGH (ref 4.8–5.6)
Mean Plasma Glucose: 165.68 mg/dL

## 2020-03-31 LAB — C-REACTIVE PROTEIN: CRP: 8.5 mg/dL — ABNORMAL HIGH (ref <1.0)

## 2020-03-31 LAB — D-DIMER, QUANTITATIVE: D-Dimer, Quant: 0.83 ug/mL-FEU — ABNORMAL HIGH (ref 0.00–0.50)

## 2020-03-31 LAB — FERRITIN: Ferritin: 3038 ng/mL — ABNORMAL HIGH (ref 24–336)

## 2020-03-31 MED ORDER — HEPARIN SODIUM (PORCINE) 1000 UNIT/ML IJ SOLN
INTRAMUSCULAR | Status: AC
  Filled 2020-03-31: qty 2

## 2020-03-31 NOTE — Progress Notes (Signed)
   03/31/20 0550  Hand-Off documentation  Handoff Given Given to shift RN/LPN  Report given to (Full Name) Loralie Champagne, RN  Handoff Received Received from shift RN/LPN  Report received from (Full Name) Neola Worrall, RN  Vital Signs  Temp 98.4 F (36.9 C)  Temp Source Oral  Pulse Rate 71  Pulse Rate Source Monitor  Resp 18  BP 136/80  BP Location Left Arm  BP Method Automatic  Patient Position (if appropriate) Lying  Oxygen Therapy  SpO2 100 %  O2 Device Nasal Cannula  O2 Flow Rate (L/min) 2 L/min  Pain Assessment  Pain Scale 0-10  Pain Score 0  Post-Hemodialysis Assessment  Rinseback Volume (mL) 250 mL  KECN 261 V  Dialyzer Clearance Lightly streaked  Duration of HD Treatment -hour(s) 3.5 hour(s)  Hemodialysis Intake (mL) 500 mL  UF Total -Machine (mL) 2744 mL  Net UF (mL) 2244 mL  Tolerated HD Treatment Yes  Post-Hemodialysis Comments tx achieved as expected, well tolerated, a/ox4, verbally responsive.  Denies pain  AVG/AVF Arterial Site Held (minutes) 10 minutes  AVG/AVF Venous Site Held (minutes) 10 minutes  Education / Care Plan  Dialysis Education Provided Yes  Note  Observations pt stable

## 2020-03-31 NOTE — Progress Notes (Signed)
  [facility] Progress Note  Subjective: seen in room, no c/o today, no issues w/ HD yesterday. SOB slightly better.  No new c/o.   Vitals:   03/31/20 0500 03/31/20 0530 03/31/20 0550 03/31/20 0828  BP: 97/64 (!) 90/59 136/80 121/65  Pulse: 61 62 71 75  Resp: 17 18 18 18   Temp:   98.4 F (36.9 C) 98.1 F (36.7 C)  TempSrc:   Oral   SpO2:   100%   Weight:      Height:        Exam: Gen alert, no distress, nasal O2 No jvd or bruits Chest soft rales at base, o/w clear RRR no MRG Abd soft ntnd no mass or ascites +bs Ext no L edema Neuro is alert, Ox 3 , nf LUA AVF+bruit    Home meds:  - asa 81/ norvasc 5/ metoprolol 12.5 bid/ crestor 20 hs  - glipizide 5mg  bid  - velphoro 1 gm tid ac  - prn's/ vitamins/ supplements     [facility]  3h 71min  400/800  66.5kg  2/2 bath  Hep 2000    - last HD 8/27 1.5 h came off at 67.5kg, before that was 8/24  - good compliance  - hect 3  - no esa / fe  CXR 8/27 - IMPRESSION:Patchy airspace opacities consistent with the given clinical history.  Labs 8/27 yest > Na 136  K 4.5  BUN 46  Cr 10.4  Ca 8.7  Alb 3.5  LFT's ok,  CRP 3.3  LDH/ TG/ lactic/ PC wnl    WBC 6K  Hb 9.5  plt 125   Assessment/ Plan: 1. COVID +pna - getting IV remdesivir and solumedrol.  CXR+ infiltrates. No sig vol excess. Per pmd 2. ESRD - on HD TTS.  Had HD yest pm. Next HD 8/31.   3. HTN/ vol - cont home BP meds. 2.2 L off on HD yest w/ one BP drop to the 90's. BP's okay today.  4. DM on oral agents at home 5. Anemia ckd - not on esa at op center, monitor 6. MBD ckd - Ca 8.7, check phos   Blake Santana 03/31/2020, 3:24 PM   Recent Labs  Lab 03/29/20 2135 03/30/20 1500 03/31/20 0204  K 4.5  --  6.1*  BUN 46*  --  91*  CREATININE 10.43*  --  13.97*  CALCIUM 8.7*  --  9.0  PHOS  --  4.5  --   HGB 9.5*  --  9.7*   Inpatient medications: . amLODipine  5 mg Oral Daily  . benzonatate  200 mg Oral TID  . Chlorhexidine  Gluconate Cloth  6 each Topical Q0600  . doxercalciferol  3 mcg Intravenous Q T,Th,Sa-HD  . heparin  2,000 Units Dialysis Once in dialysis  . heparin injection (subcutaneous)  5,000 Units Subcutaneous Q8H  . insulin aspart  0-20 Units Subcutaneous TID WC  . insulin glargine  8 Units Subcutaneous Daily  . methylPREDNISolone (SOLU-MEDROL) injection  40 mg Intravenous Q12H  . metoprolol tartrate  12.5 mg Oral BID  . rosuvastatin  20 mg Oral q1800   . remdesivir 100 mg in NS 100 mL 100 mg (03/31/20 0827)   acetaminophen, albuterol, chlorpheniramine-HYDROcodone, hydrALAZINE, ondansetron (ZOFRAN) IV, polyethylene glycol

## 2020-03-31 NOTE — Progress Notes (Signed)
PROGRESS NOTE                                                                                                                                                                                                             Patient Demographics:    Blake Santana, is a 49 y.o. male, DOB - 05-May-1971, OVF:[id number]  Outpatient Primary MD for the patient is Mack Hook, MD   Admit date - 03/29/2020   LOS - 1  Chief Complaint  Patient presents with  . Fever       Brief Narrative: Patient is a 49 y.o. male with PMHx of ESRD on HD TTS, DM-2, HTN-presenting with cough, body aches, fatigue and gradually worsening shortness of breath since 8/22-he apparently was tested positive for COVID-19 on 8/24.  He was evaluated in the emergency room and found to have acute hypoxic respiratory failure secondary COVID-19 pneumonia.  COVID-19 vaccinated status: Vaccinated Visual merchandiser doses-April 2021)  Significant Events: 8/27>> Admit to [facility] for hypoxia due to COVID-19 pneumonia  Significant studies: 8/27>>Chest x-ray: Patchy airspace opacities consistent with COVID-19 pneumonia  COVID-19 medications: Steroids: 8/27>> Remdesivir: 8/27>>  Antibiotics: None  Microbiology data: 8/27 >>blood culture: No growth  Procedures: None  Consults: Nephrology  DVT prophylaxis: heparin injection 5,000 Units Start: 03/30/20 1400    Subjective:   Patient in bed, appears comfortable, denies any headache, no fever, no chest pain or pressure, no shortness of breath , no abdominal pain. No focal weakness.    Assessment  & Plan :   Acute Hypoxic Resp Failure due to breakthrough COVID-19 infection with pneumonia: Appears to have mild disease-fully vaccinated this past April.  Continue steroids/remdesivir.  If hypoxia worsens-we will need to start Actemra.  Do not think patient had sepsis physiology, sepsis ruled out  Fever:  afebrile O2 requirements:  SpO2: 100 % O2 Flow Rate (L/min): 2 L/min FiO2 (%): (!) 2 %   COVID-19 Labs: Recent Labs    03/29/20 2300 03/31/20 0204  DDIMER 0.67* 0.83*  FERRITIN 1,855*  --   LDH 186  --   CRP 3.3*  --        Component Value Date/Time   BNP 2,559.3 (H) 11/22/2018 1018    Recent Labs  Lab 03/29/20 2300  PROCALCITON 0.68    Lab Results  Component Value Date  SARSCOV2NAA POSITIVE (A) 03/29/2020   SARSCOV2NAA POSITIVE (A) 11/22/2018   Ronald NEGATIVE 11/22/2018     Prone/Incentive Spirometry: encouraged incentive spirometry use 3-4/hour.  Thrombocytopenia: Mild-likely secondary to COVID-19-follow.  Anemia likely secondary to ESRD: Defer to nephrology-no indication to transfuse at this point  ESRD: On HD TTS-nephrology following and directing care  HTN: BP on the high side-continue amlodipine, metoprolol-monitor for another 24 hours before adjusting.  Should improve with HD.  HLD: Continue statin  DM-2: Hyperglycemia secondary to steroids-change SSI to resistant scale-add 8 units of Lantus.  Follow with A1c and CBGs.  Optimize accordingly   Recent Labs    03/30/20 2152 03/31/20 0818 03/31/20 1222  GLUCAP 241* 206* 375*     Condition - Guarded  Family Communication  : Called spouse-[phone]-no response.  Code Status :  Full Code  Diet :  Diet Order            Diet renal with fluid restriction Fluid restriction: 1200 mL Fluid; Room service appropriate? Yes; Fluid consistency: Thin  Diet effective now                  Disposition Plan  :   Status is: Inpatient  Remains inpatient appropriate because:Inpatient level of care appropriate due to severity of illness   Dispo: The patient is from: Home              Anticipated d/c is to: Home              Anticipated d/c date is: > 3 days              Patient currently is not medically stable to d/c.  Barriers to discharge: Hypoxia requiring O2 supplementation/complete 5  days of IV Remdesivir  Antimicorbials  :    Anti-infectives (From admission, onward)   Start     Dose/Rate Route Frequency Ordered Stop   03/30/20 1000  remdesivir 100 mg in sodium chloride 0.9 % 100 mL IVPB       "Followed by" Linked Group Details   100 mg 200 mL/hr over 30 Minutes Intravenous Daily 03/29/20 2244 04/03/20 0959   03/29/20 2330  remdesivir 200 mg in sodium chloride 0.9% 250 mL IVPB       "Followed by" Linked Group Details   200 mg 580 mL/hr over 30 Minutes Intravenous Once 03/29/20 2244 03/30/20 0158      Inpatient Medications  Scheduled Meds: . amLODipine  5 mg Oral Daily  . benzonatate  200 mg Oral TID  . Chlorhexidine Gluconate Cloth  6 each Topical Q0600  . doxercalciferol  3 mcg Intravenous Q T,Th,Sa-HD  . heparin  2,000 Units Dialysis Once in dialysis  . heparin injection (subcutaneous)  5,000 Units Subcutaneous Q8H  . heparin sodium (porcine)      . insulin aspart  0-20 Units Subcutaneous TID WC  . insulin glargine  8 Units Subcutaneous Daily  . methylPREDNISolone (SOLU-MEDROL) injection  40 mg Intravenous Q12H  . metoprolol tartrate  12.5 mg Oral BID  . rosuvastatin  20 mg Oral q1800   Continuous Infusions: . remdesivir 100 mg in NS 100 mL 100 mg (03/31/20 0827)   PRN Meds:.acetaminophen, albuterol, chlorpheniramine-HYDROcodone, hydrALAZINE, ondansetron (ZOFRAN) IV, polyethylene glycol   Time Spent in minutes  25  See all Orders from today for further details   Lala Lund M.D on 03/31/2020 at 12:31 PM  To page go to [email or web address] - use universal password  [facility] -  Office  [phone]    Objective:   Vitals:   03/31/20 0500 03/31/20 0530 03/31/20 0550 03/31/20 0828  BP: 97/64 (!) 90/59 136/80 121/65  Pulse: 61 62 71 75  Resp: 17 18 18 18   Temp:   98.4 F (36.9 C) 98.1 F (36.7 C)  TempSrc:   Oral   SpO2:   100%   Weight:      Height:        Wt Readings from Last 3 Encounters:  03/30/20 66.9 kg  10/18/19 66.2  kg  12/01/18 63.3 kg     Intake/Output Summary (Last 24 hours) at 03/31/2020 1231 Last data filed at 03/31/2020 0550 Gross per 24 hour  Intake 100 ml  Output 2244 ml  Net -2144 ml     Physical Exam  Awake Alert, No new F.N deficits, Normal affect [place].AT,PERRAL Supple Neck,No JVD, No cervical lymphadenopathy appriciated.  Symmetrical Chest wall movement, Good air movement bilaterally, CTAB RRR,No Gallops, Rubs or new Murmurs, No Parasternal Heave +ve B.Sounds, Abd Soft, No tenderness, No organomegaly appriciated, No rebound - guarding or rigidity. No Cyanosis, Clubbing or edema, No new Rash or bruise    Data Review:    CBC Recent Labs  Lab 03/29/20 2135 03/31/20 0204  WBC 6.7 7.3  HGB 9.5* 9.7*  HCT 30.0* 30.2*  PLT 125* 127*  MCV 97.1 94.4  MCH 30.7 30.3  MCHC 31.7 32.1  RDW 15.7* 15.4  LYMPHSABS 1.4  --   MONOABS 0.7  --   EOSABS 0.0  --   BASOSABS 0.0  --     Chemistries  Recent Labs  Lab 03/29/20 2135 03/29/20 2300 03/31/20 0204  NA 136  --  133*  K 4.5  --  6.1*  CL 94*  --  92*  CO2 26  --  20*  GLUCOSE 77  --  250*  BUN 46*  --  91*  CREATININE 10.43*  --  13.97*  CALCIUM 8.7*  --  9.0  AST  --  16 17  ALT  --  15 14  ALKPHOS  --  115 113  BILITOT  --  1.1 0.7   ------------------------------------------------------------------------------------------------------------------ Recent Labs    03/29/20 2300  TRIG 115    Lab Results  Component Value Date   HGBA1C 7.4 (H) 03/31/2020   ------------------------------------------------------------------------------------------------------------------ No results for input(s): TSH, T4TOTAL, T3FREE, THYROIDAB in the last 72 hours.  Invalid input(s): FREET3 ------------------------------------------------------------------------------------------------------------------ Recent Labs    03/29/20 2300  FERRITIN 1,855*    Coagulation profile No results for input(s): INR, PROTIME in the last  168 hours.  Recent Labs    03/29/20 2300 03/31/20 0204  DDIMER 0.67* 0.83*    Cardiac Enzymes No results for input(s): CKMB, TROPONINI, MYOGLOBIN in the last 168 hours.  Invalid input(s): CK ------------------------------------------------------------------------------------------------------------------    Component Value Date/Time   BNP 2,559.3 (H) 11/22/2018 1018    Micro Results Recent Results (from the past 240 hour(s))  SARS Coronavirus 2 by RT PCR (hospital order, performed in [facility] hospital lab) Nasopharyngeal Nasopharyngeal Swab     Status: Abnormal   Collection Time: 03/29/20 10:44 PM   Specimen: Nasopharyngeal Swab  Result Value Ref Range Status   SARS Coronavirus 2 POSITIVE (A) NEGATIVE Final    Comment: RESULT CALLED TO, READ BACK BY AND VERIFIED WITH: B OLDHAM RN 03/30/20 0015 JDW (NOTE) SARS-CoV-2 target nucleic acids are DETECTED  SARS-CoV-2 RNA is generally detectable in upper respiratory specimens  during the acute phase of infection.  Positive results are indicative  of the presence of the identified virus, but do not rule out bacterial infection or co-infection with other pathogens not detected by the test.  Clinical correlation with patient history and  other diagnostic information is necessary to determine patient infection status.  The expected result is negative.  Fact Sheet for Patients:   StrictlyIdeas.no   Fact Sheet for Healthcare Providers:   BankingDealers.co.za    This test is not yet approved or cleared by the [place] FDA and  has been authorized for detection and/or diagnosis of SARS-CoV-2 by FDA under an Emergency Use Authorization (EUA).  This EUA will remain in effect (meaning this test can  be used) for the duration of  the COVID-19 declaration under Section 564(b)(1) of the Act, 21 U.S.C. section 360-bbb-3(b)(1), unless the authorization is terminated or revoked  sooner.  Performed at [facility], [place] [address], [place], [place] [address]   Blood Culture (routine x 2)     Status: None (Preliminary result)   Collection Time: 03/29/20 10:50 PM   Specimen: BLOOD RIGHT FOREARM  Result Value Ref Range Status   Specimen Description BLOOD RIGHT FOREARM  Final   Special Requests   Final    BOTTLES DRAWN AEROBIC AND ANAEROBIC Blood Culture adequate volume   Culture   Final    NO GROWTH < 12 HOURS Performed at [facility], [place] [address], [place], [place] [address]    Report Status PENDING  Incomplete    Radiology Reports DG Chest Portable 1 View  Result Date: 03/29/2020 CLINICAL DATA:  Shortness of breath, COVID-19 positivity EXAM: PORTABLE CHEST 1 VIEW COMPARISON:  11/26/2018 FINDINGS: Cardiac shadow is enlarged but stable. Patchy airspace opacities are noted bilaterally consistent with the given clinical history. No sizable effusion is seen. No bony abnormality is noted. IMPRESSION: Patchy airspace opacities consistent with the given clinical history. Electronically Signed   By: Inez Catalina M.D.   On: 03/29/2020 21:51

## 2020-03-31 NOTE — Evaluation (Signed)
Physical Therapy Evaluation and Discharge Patient Details Name: Blake Santana MRN: [id number] DOB: 11-19-1970 Today's Date: 03/31/2020   History of Present Illness  Pt is a 49 y.o. M with significant PMH of ESRD on HD TTS, DM2, HTN who presents with acute hypoxic respiratory failure secondary to COVID-19 pneumonia.   Clinical Impression  Patient evaluated by Physical Therapy with no further acute PT needs identified. Pt ambulating 800 feet with no assistive device without physical difficulty. HR stable in 70's, pt on 2L O2. Unfortunately, could not obtain oxygen saturation due to pt not being on pulse oximetry and no dynamap being present with pulse oximeter. Subjectively, pt reporting 0 on Borg Dyspnea Scale at rest, and 2 with mobility. Education provided regarding ambulating 3x/day, prone positioning (when sleeping if possible), and endurance conservation techniques. All education has been completed and the patient has no further questions. No follow-up Physical Therapy or equipment needs. PT is signing off. Thank you for this referral.     Follow Up Recommendations No PT follow up    Equipment Recommendations  None recommended by PT    Recommendations for Other Services       Precautions / Restrictions Precautions Precautions: None Restrictions Weight Bearing Restrictions: No      Mobility  Bed Mobility Overal bed mobility: Independent                Transfers Overall transfer level: Independent Equipment used: None                Ambulation/Gait Ambulation/Gait assistance: Independent Gait Distance (Feet): 800 Feet Assistive device: None Gait Pattern/deviations: WFL(Within Functional Limits)        Stairs            Wheelchair Mobility    Modified Rankin (Stroke Patients Only)       Balance Overall balance assessment: No apparent balance deficits (not formally assessed)                                            Pertinent Vitals/Pain Pain Assessment: No/denies pain    Home Living Family/patient expects to be discharged to:: Private residence Living Arrangements: Spouse/significant other;Children Available Help at Discharge: Family (21, 28, 71 y.o. 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, newspaper        Extremity/Trunk Assessment   Upper Extremity Assessment Upper Extremity Assessment: Overall WFL for tasks assessed    Lower Extremity Assessment Lower Extremity Assessment: Overall WFL for tasks assessed    Cervical / Trunk Assessment Cervical / Trunk Assessment: Normal  Communication   Communication: Prefers language other than English (Spanish)  Cognition Arousal/Alertness: Awake/alert Behavior During Therapy: WFL for tasks assessed/performed Overall Cognitive Status: Within Functional Limits for tasks assessed                                        General Comments      Exercises     Assessment/Plan    PT Assessment Patent does not need any further PT services  PT Problem  List         PT Treatment Interventions      PT Goals (Current goals can be found in the Care Plan section)  Acute Rehab PT Goals Patient Stated Goal: take a shower PT Goal Formulation: All assessment and education complete, DC therapy    Frequency     Barriers to discharge        Co-evaluation               AM-PAC PT "6 Clicks" Mobility  Outcome Measure Help needed turning from your back to your side while in a flat bed without using bedrails?: None Help needed moving from lying on your back to sitting on the side of a flat bed without using bedrails?: None Help needed moving to and from a bed to a chair (including a wheelchair)?: None Help needed standing up from a chair  using your arms (e.g., wheelchair or bedside chair)?: None Help needed to walk in hospital room?: None Help needed climbing 3-5 steps with a railing? : None 6 Click Score: 24    End of Session   Activity Tolerance: Patient tolerated treatment well Patient left: with call bell/phone within reach;Other (comment) (seated EOB) Nurse Communication: Mobility status PT Visit Diagnosis: Difficulty in walking, not elsewhere classified (R26.2)    Time: 2542-7062 PT Time Calculation (min) (ACUTE ONLY): 22 min   Charges:   PT Evaluation $PT Eval Moderate Complexity: [place], PT, DPT Acute Rehabilitation Services Pager [phone] Office [phone]   Deno Etienne 03/31/2020, 1:37 PM

## 2020-04-01 DIAGNOSIS — U071 COVID-19: Secondary | ICD-10-CM | POA: Diagnosis not present

## 2020-04-01 DIAGNOSIS — J9600 Acute respiratory failure, unspecified whether with hypoxia or hypercapnia: Secondary | ICD-10-CM | POA: Diagnosis not present

## 2020-04-01 LAB — COMPREHENSIVE METABOLIC PANEL
ALT: 15 U/L (ref 0–44)
AST: 20 U/L (ref 15–41)
Albumin: 3 g/dL — ABNORMAL LOW (ref 3.5–5.0)
Alkaline Phosphatase: 99 U/L (ref 38–126)
Anion gap: 18 — ABNORMAL HIGH (ref 5–15)
BUN: 83 mg/dL — ABNORMAL HIGH (ref 6–20)
CO2: 24 mmol/L (ref 22–32)
Calcium: 8.5 mg/dL — ABNORMAL LOW (ref 8.9–10.3)
Chloride: 91 mmol/L — ABNORMAL LOW (ref 98–111)
Creatinine, Ser: 9.77 mg/dL — ABNORMAL HIGH (ref 0.61–1.24)
GFR calc Af Amer: 6 mL/min — ABNORMAL LOW (ref >60)
GFR calc non Af Amer: 6 mL/min — ABNORMAL LOW (ref >60)
Glucose, Bld: 306 mg/dL — ABNORMAL HIGH (ref 70–99)
Potassium: 5.9 mmol/L — ABNORMAL HIGH (ref 3.5–5.1)
Sodium: 133 mmol/L — ABNORMAL LOW (ref 135–145)
Total Bilirubin: 0.5 mg/dL (ref 0.3–1.2)
Total Protein: 6.8 g/dL (ref 6.5–8.1)

## 2020-04-01 LAB — CBC
HCT: 29.5 % — ABNORMAL LOW (ref 39.0–52.0)
Hemoglobin: 9.8 g/dL — ABNORMAL LOW (ref 13.0–17.0)
MCH: 30.9 pg (ref 26.0–34.0)
MCHC: 33.2 g/dL (ref 30.0–36.0)
MCV: 93.1 fL (ref 80.0–100.0)
Platelets: 138 10*3/uL — ABNORMAL LOW (ref 150–400)
RBC: 3.17 MIL/uL — ABNORMAL LOW (ref 4.22–5.81)
RDW: 15.4 % (ref 11.5–15.5)
WBC: 8.4 10*3/uL (ref 4.0–10.5)
nRBC: 0 % (ref 0.0–0.2)

## 2020-04-01 LAB — D-DIMER, QUANTITATIVE: D-Dimer, Quant: 0.55 ug/mL-FEU — ABNORMAL HIGH (ref 0.00–0.50)

## 2020-04-01 LAB — C-REACTIVE PROTEIN: CRP: 6.1 mg/dL — ABNORMAL HIGH (ref <1.0)

## 2020-04-01 LAB — GLUCOSE, CAPILLARY
Glucose-Capillary: 236 mg/dL — ABNORMAL HIGH (ref 70–99)
Glucose-Capillary: 343 mg/dL — ABNORMAL HIGH (ref 70–99)
Glucose-Capillary: 279 mg/dL — ABNORMAL HIGH (ref 70–99)

## 2020-04-01 LAB — FERRITIN: Ferritin: 3120 ng/mL — ABNORMAL HIGH (ref 24–336)

## 2020-04-01 MED ORDER — CHLORHEXIDINE GLUCONATE CLOTH 2 % EX PADS
6.0000 | MEDICATED_PAD | Freq: Every day | CUTANEOUS | Status: DC
  Administered 2020-04-01 – 2020-04-02 (×2): 6 via TOPICAL

## 2020-04-01 MED ORDER — DOXERCALCIFEROL 4 MCG/2ML IV SOLN
INTRAVENOUS | Status: AC
  Filled 2020-04-01: qty 2

## 2020-04-01 MED ORDER — HEPARIN SODIUM (PORCINE) 1000 UNIT/ML IJ SOLN
INTRAMUSCULAR | Status: AC
  Filled 2020-04-01: qty 2

## 2020-04-01 MED ORDER — SODIUM ZIRCONIUM CYCLOSILICATE 10 G PO PACK
10.0000 g | PACK | Freq: Once | ORAL | Status: AC
  Administered 2020-04-01: 10 g via ORAL
  Filled 2020-04-01: qty 1

## 2020-04-01 NOTE — Progress Notes (Addendum)
Renal Navigator notified [facility] of patient's COVID positive status and asked for confirmation about isolation plan for discharge.  Renal Navigator will follow closely. Update: Patient will treat at [place] [facility] on a MWF 3rd shift, 5:30pm. Nephrologist updated.  Alphonzo Cruise, [place] Renal Navigator [phone]

## 2020-04-01 NOTE — Progress Notes (Signed)
Inpatient Diabetes Program Recommendations  AACE/ADA: New Consensus Statement on Inpatient Glycemic Control (2015)  Target Ranges:  Prepandial:   less than 140 mg/dL      Peak postprandial:   less than 180 mg/dL (1-2 hours)      Critically ill patients:  140 - 180 mg/dL   Lab Results  Component Value Date   GLUCAP 343 (H) 04/01/2020   HGBA1C 7.4 (H) 03/31/2020    Review of Glycemic Control Results for Blake Santana, Blake Santana (MRN [id number]) as of 04/01/2020 14:09  Ref. Range 03/31/2020 16:26 03/31/2020 21:24 04/01/2020 08:23 04/01/2020 11:54  Glucose-Capillary Latest Ref Range: 70 - 99 mg/dL 167 (H) 329 (H) 236 (H) 343 (H)   Diabetes history: Type 2 DM Outpatient Diabetes medications: Glipizide 5 mg BID Current orders for Inpatient glycemic control: Lantus 8 units QD, Novolog 0-20 units TID Solumedrol 40 mg BID  Inpatient Diabetes Program Recommendations:    In the setting of steroids and covid, may want to consider increasing basal insulin and adding Tradjenta 5 mg QD.   Thanks, Bronson Curb, MSN, RNC-OB Diabetes Coordinator [phone] (8a-5p)

## 2020-04-01 NOTE — Progress Notes (Signed)
[facility] Progress Note  Subjective:   No overnight events  No acute c/o  For HD tomorrow  K 5.9 this AM  On RA  Remains on remdesivir  Vitals:   03/31/20 0828 03/31/20 1627 03/31/20 2131 04/01/20 0819  BP: 121/65 (!) 142/72 (!) 154/77 (!) 157/83  Pulse: 75 73 79 69  Resp: 18 18 18 20   Temp: 98.1 F (36.7 C) 98.3 F (36.8 C) 98.5 F (36.9 C) 98.2 F (36.8 C)  TempSrc:   Oral   SpO2:  96% 92% 97%  Weight:      Height:        Exam: Gen alert, no distress,  No jvd or bruits Chest soft rales at base, o/w clear RRR no MRG Abd soft ntnd no mass or ascites +bs Ext no L edema Neuro is alert, Ox 3 , nf LUA AVF+bruit    Home meds:  - asa 81/ norvasc 5/ metoprolol 12.5 bid/ crestor 20 hs  - glipizide 5mg  bid  - velphoro 1 gm tid ac  - prn's/ vitamins/ supplements     [facility]  3h 45min  400/800  66.5kg  2/2 bath  Hep 2000    - last HD 8/27 1.5 h came off at 67.5kg, before that was 8/24  - good compliance  - hect 3  - no esa / fe  CXR 8/27 - IMPRESSION:Patchy airspace opacities consistent with the given clinical history.  Labs 8/27 yest > Na 136  K 4.5  BUN 46  Cr 10.4  Ca 8.7  Alb 3.5  LFT's ok,  CRP 3.3  LDH/ TG/ lactic/ PC wnl    WBC 6K  Hb 9.5  plt 125   Assessment/ Plan: 1. COVID +pna - getting IV remdesivir and solumedrol.  CXR+ infiltrates. No sig vol excess. Per pmd 2. ESRD - on HD TTS.  Had HD yest pm. Next HD 8/31.  2K 2-3L UF.  Lokelma today form mild ^K 3. HTN/ vol - cont home BP meds, CTM 4. DM on oral agents at home 5. Anemia ckd - not on esa at op center, monitor 6. MBD ckd - Ca 8.7, P Ok   Rob Schertz 04/01/2020, 10:07 AM   Recent Labs  Lab 03/29/20 2135 03/30/20 1500 03/31/20 0204 04/01/20 0310  K   < >  --  6.1* 5.9*  BUN   < >  --  91* 83*  CREATININE   < >  --  13.97* 9.77*  CALCIUM   < >  --  9.0 8.5*  PHOS  --  4.5  --   --   HGB   < >  --  9.7* 9.8*   < > = values in this interval  not displayed.   Inpatient medications: . amLODipine  5 mg Oral Daily  . benzonatate  200 mg Oral TID  . Chlorhexidine Gluconate Cloth  6 each Topical Q0600  . Chlorhexidine Gluconate Cloth  6 each Topical Q0600  . doxercalciferol  3 mcg Intravenous Q T,Th,Sa-HD  . heparin  2,000 Units Dialysis Once in dialysis  . heparin injection (subcutaneous)  5,000 Units Subcutaneous Q8H  . insulin aspart  0-20 Units Subcutaneous TID WC  . insulin glargine  8 Units Subcutaneous Daily  . methylPREDNISolone (SOLU-MEDROL) injection  40 mg Intravenous Q12H  . metoprolol tartrate  12.5 mg Oral BID  . rosuvastatin  20 mg Oral q1800  . sodium zirconium cyclosilicate  10 g Oral Once   .  remdesivir 100 mg in NS 100 mL 100 mg (04/01/20 0924)   acetaminophen, albuterol, chlorpheniramine-HYDROcodone, hydrALAZINE, ondansetron (ZOFRAN) IV, polyethylene glycol

## 2020-04-01 NOTE — Progress Notes (Signed)
PROGRESS NOTE                                                                                                                                                                                                             Patient Demographics:    Blake Santana, is a 49 y.o. male, DOB - 03-21-1971, GUY:[id number]  Outpatient Primary MD for the patient is Mack Hook, MD   Admit date - 03/29/2020   LOS - 2  Chief Complaint  Patient presents with  . Fever       Brief Narrative: Patient is a 49 y.o. male with PMHx of ESRD on HD TTS, DM-2, HTN-presenting with cough, body aches, fatigue and gradually worsening shortness of breath since 8/22-he apparently was tested positive for COVID-19 on 8/24.  He was evaluated in the emergency room and found to have acute hypoxic respiratory failure secondary COVID-19 pneumonia.  COVID-19 vaccinated status: Vaccinated Visual merchandiser doses-April 2021)  Significant Events: 8/27>> Admit to [facility] for hypoxia due to COVID-19 pneumonia  Significant studies: 8/27>>Chest x-ray: Patchy airspace opacities consistent with COVID-19 pneumonia  COVID-19 medications: Steroids: 8/27>> Remdesivir: 8/27>>  Antibiotics: None  Microbiology data: 8/27 >>blood culture: No growth  Procedures: None  Consults: Nephrology  DVT prophylaxis: heparin injection 5,000 Units Start: 03/30/20 1400    Subjective:   Patient in bed, appears comfortable, denies any headache, no fever, no chest pain or pressure, no shortness of breath , no abdominal pain. No focal weakness.   Assessment  & Plan :   Acute Hypoxic Resp Failure due to breakthrough COVID-19 infection with pneumonia: Appears to have mild disease-fully vaccinated this past April.  Continue steroids/remdesivir.  If hypoxia worsens-we will need to start Actemra.  Do not think patient had sepsis physiology, sepsis ruled out  Fever:  afebrile O2 requirements:  SpO2: 97 % O2 Flow Rate (L/min): 2 L/min FiO2 (%): (!) 2 %   COVID-19 Labs: Recent Labs    03/29/20 2300 03/31/20 0204 03/31/20 1213 04/01/20 0310  DDIMER 0.67* 0.83*  --  0.55*  FERRITIN 1,855*  --  3,038* 3,120*  LDH 186  --   --   --   CRP 3.3*  --  8.5* 6.1*       Component Value Date/Time   BNP 2,559.3 (H) 11/22/2018 1018    Recent Labs  Lab 03/29/20 2300  PROCALCITON 0.68    Lab Results  Component Value Date   SARSCOV2NAA POSITIVE (A) 03/29/2020   SARSCOV2NAA POSITIVE (A) 11/22/2018   [place] NEGATIVE 11/22/2018     Prone/Incentive Spirometry: encouraged incentive spirometry use 3-4/hour.  Thrombocytopenia: Mild-likely secondary to COVID-19-follow.  Anemia likely secondary to ESRD: Defer to nephrology-no indication to transfuse at this point  ESRD: On HD TTS-nephrology following and directing care  HTN: BP on the high side-continue amlodipine, metoprolol-monitor for another 24 hours before adjusting.  Should improve with HD.  HLD: Continue statin  DM-2: Hyperglycemia secondary to steroids-change SSI to resistant scale-add 8 units of Lantus.  Follow with A1c and CBGs.  Optimize accordingly   Recent Labs    03/31/20 1626 03/31/20 2124 04/01/20 0823  GLUCAP 167* 329* 236*     Condition - Guarded  Family Communication  : Called [phone]-no response.  Code Status :  Full Code  Diet :  Diet Order            Diet renal with fluid restriction Fluid restriction: 1200 mL Fluid; Room service appropriate? Yes; Fluid consistency: Thin  Diet effective now                  Disposition Plan  :   Status is: Inpatient  Remains inpatient appropriate because:Inpatient level of care appropriate due to severity of illness   Dispo: The patient is from: Home              Anticipated d/c is to: Home              Anticipated d/c date is: > 3 days              Patient currently is not medically stable to  d/c.  Barriers to discharge: Hypoxia requiring O2 supplementation/complete 5 days of IV Remdesivir  Antimicorbials  :    Anti-infectives (From admission, onward)   Start     Dose/Rate Route Frequency Ordered Stop   03/30/20 1000  remdesivir 100 mg in sodium chloride 0.9 % 100 mL IVPB       "Followed by" Linked Group Details   100 mg 200 mL/hr over 30 Minutes Intravenous Daily 03/29/20 2244 04/03/20 0959   03/29/20 2330  remdesivir 200 mg in sodium chloride 0.9% 250 mL IVPB       "Followed by" Linked Group Details   200 mg 580 mL/hr over 30 Minutes Intravenous Once 03/29/20 2244 03/30/20 0158      Inpatient Medications  Scheduled Meds: . amLODipine  5 mg Oral Daily  . benzonatate  200 mg Oral TID  . Chlorhexidine Gluconate Cloth  6 each Topical Q0600  . Chlorhexidine Gluconate Cloth  6 each Topical Q0600  . doxercalciferol  3 mcg Intravenous Q T,Th,Sa-HD  . heparin  2,000 Units Dialysis Once in dialysis  . heparin injection (subcutaneous)  5,000 Units Subcutaneous Q8H  . insulin aspart  0-20 Units Subcutaneous TID WC  . insulin glargine  8 Units Subcutaneous Daily  . methylPREDNISolone (SOLU-MEDROL) injection  40 mg Intravenous Q12H  . metoprolol tartrate  12.5 mg Oral BID  . rosuvastatin  20 mg Oral q1800   Continuous Infusions: . remdesivir 100 mg in NS 100 mL 100 mg (04/01/20 0924)   PRN Meds:.acetaminophen, albuterol, chlorpheniramine-HYDROcodone, hydrALAZINE, ondansetron (ZOFRAN) IV, polyethylene glycol   Time Spent in minutes  25  See all Orders from today for further details   Lala Lund M.D on 04/01/2020 at  11:40 AM  To page go to [email or web address] - use universal password  [facility] -  Office  [phone]    Objective:   Vitals:   03/31/20 0828 03/31/20 1627 03/31/20 2131 04/01/20 0819  BP: 121/65 (!) 142/72 (!) 154/77 (!) 157/83  Pulse: 75 73 79 69  Resp: 18 18 18 20   Temp: 98.1 F (36.7 C) 98.3 F (36.8 C) 98.5 F (36.9 C) 98.2 F (36.8  C)  TempSrc:   Oral   SpO2:  96% 92% 97%  Weight:      Height:        Wt Readings from Last 3 Encounters:  03/30/20 66.9 kg  10/18/19 66.2 kg  12/01/18 63.3 kg    No intake or output data in the 24 hours ending 04/01/20 1140   Physical Exam  Awake Alert, No new F.N deficits, Normal affect [place].AT,PERRAL Supple Neck,No JVD, No cervical lymphadenopathy appriciated.  Symmetrical Chest wall movement, Good air movement bilaterally, CTAB RRR,No Gallops, Rubs or new Murmurs, No Parasternal Heave +ve B.Sounds, Abd Soft, No tenderness, No organomegaly appriciated, No rebound - guarding or rigidity. No Cyanosis, Clubbing or edema, No new Rash or bruise     Data Review:    CBC Recent Labs  Lab 03/29/20 2135 03/31/20 0204 04/01/20 0310  WBC 6.7 7.3 8.4  HGB 9.5* 9.7* 9.8*  HCT 30.0* 30.2* 29.5*  PLT 125* 127* 138*  MCV 97.1 94.4 93.1  MCH 30.7 30.3 30.9  MCHC 31.7 32.1 33.2  RDW 15.7* 15.4 15.4  LYMPHSABS 1.4  --   --   MONOABS 0.7  --   --   EOSABS 0.0  --   --   BASOSABS 0.0  --   --     Chemistries  Recent Labs  Lab 03/29/20 2135 03/29/20 2300 03/31/20 0204 04/01/20 0310  NA 136  --  133* 133*  K 4.5  --  6.1* 5.9*  CL 94*  --  92* 91*  CO2 26  --  20* 24  GLUCOSE 77  --  250* 306*  BUN 46*  --  91* 83*  CREATININE 10.43*  --  13.97* 9.77*  CALCIUM 8.7*  --  9.0 8.5*  AST  --  16 17 20   ALT  --  15 14 15   ALKPHOS  --  115 113 99  BILITOT  --  1.1 0.7 0.5   ------------------------------------------------------------------------------------------------------------------ Recent Labs    03/29/20 2300  TRIG 115    Lab Results  Component Value Date   HGBA1C 7.4 (H) 03/31/2020   ------------------------------------------------------------------------------------------------------------------ No results for input(s): TSH, T4TOTAL, T3FREE, THYROIDAB in the last 72 hours.  Invalid input(s):  FREET3 ------------------------------------------------------------------------------------------------------------------ Recent Labs    03/31/20 1213 04/01/20 0310  FERRITIN 3,038* 3,120*    Coagulation profile No results for input(s): INR, PROTIME in the last 168 hours.  Recent Labs    03/31/20 0204 04/01/20 0310  DDIMER 0.83* 0.55*    Cardiac Enzymes No results for input(s): CKMB, TROPONINI, MYOGLOBIN in the last 168 hours.  Invalid input(s): CK ------------------------------------------------------------------------------------------------------------------    Component Value Date/Time   BNP 2,559.3 (H) 11/22/2018 1018    Micro Results Recent Results (from the past 240 hour(s))  SARS Coronavirus 2 by RT PCR (hospital order, performed in [facility] hospital lab) Nasopharyngeal Nasopharyngeal Swab     Status: Abnormal   Collection Time: 03/29/20 10:44 PM   Specimen: Nasopharyngeal Swab  Result Value Ref Range Status   SARS Coronavirus 2 POSITIVE (A)  NEGATIVE Final    Comment: RESULT CALLED TO, READ BACK BY AND VERIFIED WITH: B OLDHAM RN 03/30/20 0015 JDW (NOTE) SARS-CoV-2 target nucleic acids are DETECTED  SARS-CoV-2 RNA is generally detectable in upper respiratory specimens  during the acute phase of infection.  Positive results are indicative  of the presence of the identified virus, but do not rule out bacterial infection or co-infection with other pathogens not detected by the test.  Clinical correlation with patient history and  other diagnostic information is necessary to determine patient infection status.  The expected result is negative.  Fact Sheet for Patients:   StrictlyIdeas.no   Fact Sheet for Healthcare Providers:   BankingDealers.co.za    This test is not yet approved or cleared by the [place] FDA and  has been authorized for detection and/or diagnosis of SARS-CoV-2 by FDA under an Emergency  Use Authorization (EUA).  This EUA will remain in effect (meaning this test can  be used) for the duration of  the COVID-19 declaration under Section 564(b)(1) of the Act, 21 U.S.C. section 360-bbb-3(b)(1), unless the authorization is terminated or revoked sooner.  Performed at [facility], [place] [address] [place], [place] [address]   Blood Culture (routine x 2)     Status: None (Preliminary result)   Collection Time: 03/29/20 10:50 PM   Specimen: BLOOD RIGHT FOREARM  Result Value Ref Range Status   Specimen Description BLOOD RIGHT FOREARM  Final   Special Requests   Final    BOTTLES DRAWN AEROBIC AND ANAEROBIC Blood Culture adequate volume   Culture   Final    NO GROWTH 3 DAYS Performed at [facility], [place] [address], [place], [place] [address]    Report Status PENDING  Incomplete    Radiology Reports DG Chest Portable 1 View  Result Date: 03/29/2020 CLINICAL DATA:  Shortness of breath, COVID-19 positivity EXAM: PORTABLE CHEST 1 VIEW COMPARISON:  11/26/2018 FINDINGS: Cardiac shadow is enlarged but stable. Patchy airspace opacities are noted bilaterally consistent with the given clinical history. No sizable effusion is seen. No bony abnormality is noted. IMPRESSION: Patchy airspace opacities consistent with the given clinical history. Electronically Signed   By: Inez Catalina M.D.   On: 03/29/2020 21:51

## 2020-04-02 DIAGNOSIS — J9600 Acute respiratory failure, unspecified whether with hypoxia or hypercapnia: Secondary | ICD-10-CM | POA: Diagnosis not present

## 2020-04-02 DIAGNOSIS — U071 COVID-19: Secondary | ICD-10-CM | POA: Diagnosis not present

## 2020-04-02 LAB — CBC
HCT: 32.4 % — ABNORMAL LOW (ref 39.0–52.0)
Hemoglobin: 10.6 g/dL — ABNORMAL LOW (ref 13.0–17.0)
MCH: 30.3 pg (ref 26.0–34.0)
MCHC: 32.7 g/dL (ref 30.0–36.0)
MCV: 92.6 fL (ref 80.0–100.0)
Platelets: 139 10*3/uL — ABNORMAL LOW (ref 150–400)
RBC: 3.5 MIL/uL — ABNORMAL LOW (ref 4.22–5.81)
RDW: 15.5 % (ref 11.5–15.5)
WBC: 7 10*3/uL (ref 4.0–10.5)
nRBC: 0 % (ref 0.0–0.2)

## 2020-04-02 LAB — C-REACTIVE PROTEIN: CRP: 5.8 mg/dL — ABNORMAL HIGH (ref <1.0)

## 2020-04-02 LAB — COMPREHENSIVE METABOLIC PANEL
ALT: 17 U/L (ref 0–44)
AST: 22 U/L (ref 15–41)
Albumin: 3.1 g/dL — ABNORMAL LOW (ref 3.5–5.0)
Alkaline Phosphatase: 100 U/L (ref 38–126)
Anion gap: 17 — ABNORMAL HIGH (ref 5–15)
BUN: 51 mg/dL — ABNORMAL HIGH (ref 6–20)
CO2: 26 mmol/L (ref 22–32)
Calcium: 8.9 mg/dL (ref 8.9–10.3)
Chloride: 89 mmol/L — ABNORMAL LOW (ref 98–111)
Creatinine, Ser: 6.34 mg/dL — ABNORMAL HIGH (ref 0.61–1.24)
GFR calc Af Amer: 11 mL/min — ABNORMAL LOW (ref >60)
GFR calc non Af Amer: 9 mL/min — ABNORMAL LOW (ref >60)
Glucose, Bld: 435 mg/dL — ABNORMAL HIGH (ref 70–99)
Potassium: 4.7 mmol/L (ref 3.5–5.1)
Sodium: 132 mmol/L — ABNORMAL LOW (ref 135–145)
Total Bilirubin: 0.6 mg/dL (ref 0.3–1.2)
Total Protein: 7.3 g/dL (ref 6.5–8.1)

## 2020-04-02 LAB — GLUCOSE, CAPILLARY
Glucose-Capillary: 353 mg/dL — ABNORMAL HIGH (ref 70–99)
Glucose-Capillary: 310 mg/dL — ABNORMAL HIGH (ref 70–99)

## 2020-04-02 LAB — FERRITIN: Ferritin: 2990 ng/mL — ABNORMAL HIGH (ref 24–336)

## 2020-04-02 LAB — D-DIMER, QUANTITATIVE: D-Dimer, Quant: 0.54 ug/mL-FEU — ABNORMAL HIGH (ref 0.00–0.50)

## 2020-04-02 MED ORDER — METHYLPREDNISOLONE SODIUM SUCC 40 MG IJ SOLR
20.0000 mg | Freq: Every day | INTRAMUSCULAR | Status: DC
  Administered 2020-04-02: 20 mg via INTRAVENOUS
  Filled 2020-04-02: qty 1

## 2020-04-02 NOTE — Progress Notes (Signed)
Patient discharged to home. Verbalizes understanding of all discharge instructions and follow up MD visits. D/c instructions reviewed with stratus interpreter.

## 2020-04-02 NOTE — Discharge Summary (Signed)
Blake Santana MLY:[id number] DOB: 1970-12-16 DOA: 03/29/2020  PCP: Mack Hook, MD  Admit date: 03/29/2020  Discharge date: 04/02/2020  Admitted From: Home   Disposition:  Home   Recommendations for Outpatient Follow-up:   Follow up with PCP in 1-2 weeks  PCP Please obtain BMP/CBC, 2 view CXR in 1week,  (see Discharge instructions)   PCP Please follow up on the following pending results:    Home Health: None   Equipment/Devices: None  Consultations: Renal Discharge Condition: Stable    CODE STATUS: Full    Diet Recommendation: Renal diet - Low Carb,  1.5lit/day fluid restriction    Chief Complaint  Patient presents with  . Fever     Brief history of present illness from the day of admission and additional interim summary    Patient is a 49 y.o. male with PMHx of ESRD on HD TTS, DM-2, HTN-presenting with cough, body aches, fatigue and gradually worsening shortness of breath since 8/22-he apparently was tested positive for COVID-19 on 8/24.  He was evaluated in the emergency room and found to have acute hypoxic respiratory failure secondary COVID-19 pneumonia.  COVID-19 vaccinated status: Vaccinated Therapist, music x2 doses-April 2021)  Significant Events: 8/27>> Admit to [facility] for hypoxia due to COVID-19 pneumonia  Significant studies: 8/27>>Chest x-ray: Patchy airspace opacities consistent with COVID-19 pneumonia  COVID-19 medications: Steroids: 8/27>> Remdesivir: 8/27>>                                                                 Hospital Course   Mild Acute Hypoxic Resp Failure due to breakthrough COVID-19 infection with pneumonia: Appears to have mild disease-fully vaccinated this past April. Was treated with Remdesivir and Steroids, stable and symptom free on RA for 2 days, DC home  after his last dose today.  Thrombocytopenia: Mild-likely secondary to COVID-19-follow.  Lab Results  Component Value Date   PLT 139 (L) 04/02/2020    Anemia likely secondary to ESRD: Defer to nephrology-no indication to transfuse at this point  ESRD: On HD TTS-nephrology following and directing care, DW Dr Joelyn Oms.  HTN: BP on the high side-continue amlodipine, metoprolol- PCP to monitor and adjust.  HLD: Continue statin  DM-2: continue Home Rx  Lab Results  Component Value Date   HGBA1C 7.4 (H) 03/31/2020     Discharge diagnosis     Principal Problem:   Acute respiratory failure due to COVID-19 [facility]) Active Problems:   Diabetes mellitus with ESRD (end-stage renal disease) ([place])   Hypertension   ESRD (end stage renal disease) on dialysis ([place])   Pneumonia due to COVID-19 virus   Sepsis [facility])    Discharge instructions    Discharge Instructions    Discharge instructions   Complete by: As directed    Follow with Primary MD Mack Hook, MD  in 7 days   Get CBC, CMP, 2 view Chest X ray -  checked next visit within 1 week by Primary MD   Activity: As tolerated with Full fall precautions use walker/cane & assistance as needed  Disposition Home    Diet: Renal diet - Low Carb,  1.5lit/day fluid restriction  Special Instructions: If you have smoked or chewed Tobacco  in the last 2 yrs please stop smoking, stop any regular Alcohol  and or any Recreational drug use.  On your next visit with your primary care physician please Get Medicines reviewed and adjusted.  Please request your Prim.MD to go over all Hospital Tests and Procedure/Radiological results at the follow up, please get all Hospital records sent to your Prim MD by signing hospital release before you go home.  If you experience worsening of your admission symptoms, develop shortness of breath, life threatening emergency, suicidal or homicidal thoughts you must seek medical attention immediately  by calling 911 or calling your MD immediately  if symptoms less severe.  You Must read complete instructions/literature along with all the possible adverse reactions/side effects for all the Medicines you take and that have been prescribed to you. Take any new Medicines after you have completely understood and accpet all the possible adverse reactions/side effects.   Increase activity slowly   Complete by: As directed    MyChart COVID-19 home monitoring program   Complete by: Apr 02, 2020    Is the patient willing to use the [place] for home monitoring?: Yes   No wound care   Complete by: As directed    Temperature monitoring   Complete by: Apr 02, 2020    After how many days would you like to receive a notification of this patient's flowsheet entries?: 1      Discharge Medications   Allergies as of 04/02/2020   No Known Allergies     Medication List    TAKE these medications   AgaMatrix Presto Test test strip Generic drug: glucose blood Check sugars twice daily   AgaMatrix Presto w/Device Kit Check sugars twice daily   AgaMatrix Ultra-Thin Lancets Misc Check blood glucose twice daily before meals.   amLODipine 5 MG tablet Commonly known as: NORVASC Take 1 tablet by mouth once daily with breakfast   aspirin 81 MG EC tablet Take 1 tablet (81 mg total) by mouth daily.   b complex-vitamin c-folic acid 0.8 MG Tabs tablet Take 1 tablet by mouth daily.   glipiZIDE 5 MG tablet Commonly known as: GLUCOTROL Take 1 tablet (5 mg total) by mouth 2 (two) times daily with a meal.   metoprolol tartrate 25 MG tablet Commonly known as: LOPRESSOR TAKE 1/2 (ONE-HALF) TABLET BY MOUTH TWICE DAILY WITH MEALS What changed:   how much to take  how to take this  when to take this   rosuvastatin 20 MG tablet Commonly known as: CRESTOR Take 1 tablet (20 mg total) by mouth daily at 6 PM.   sucroferric oxyhydroxide 500 MG chewable tablet Commonly known as: VELPHORO Chew  2 tablets (1,000 mg total) by mouth 3 (three) times daily with meals.        Follow-up Information    Mack Hook, MD. Schedule an appointment as soon as possible for a visit in 1 week(s).   Specialty: Internal Medicine Contact information: [place] [address] [phone]        Sueanne Margarita, MD. Schedule an appointment as soon as possible  for a visit in 1 week(s).   Specialty: Cardiology Contact information: [address] [place] [address] [phone]               Major procedures and Radiology Reports - PLEASE review detailed and final reports thoroughly  -       DG Chest Portable 1 View  Result Date: 03/29/2020 CLINICAL DATA:  Shortness of breath, COVID-19 positivity EXAM: PORTABLE CHEST 1 VIEW COMPARISON:  11/26/2018 FINDINGS: Cardiac shadow is enlarged but stable. Patchy airspace opacities are noted bilaterally consistent with the given clinical history. No sizable effusion is seen. No bony abnormality is noted. IMPRESSION: Patchy airspace opacities consistent with the given clinical history. Electronically Signed   By: Inez Catalina M.D.   On: 03/29/2020 21:51    Micro Results     Recent Results (from the past 240 hour(s))  SARS Coronavirus 2 by RT PCR (hospital order, performed in [facility] hospital lab) Nasopharyngeal Nasopharyngeal Swab     Status: Abnormal   Collection Time: 03/29/20 10:44 PM   Specimen: Nasopharyngeal Swab  Result Value Ref Range Status   SARS Coronavirus 2 POSITIVE (A) NEGATIVE Final    Comment: RESULT CALLED TO, READ BACK BY AND VERIFIED WITH: B OLDHAM RN 03/30/20 0015 JDW (NOTE) SARS-CoV-2 target nucleic acids are DETECTED  SARS-CoV-2 RNA is generally detectable in upper respiratory specimens  during the acute phase of infection.  Positive results are indicative  of the presence of the identified virus, but do not rule out bacterial infection or co-infection with other pathogens  not detected by the test.  Clinical correlation with patient history and  other diagnostic information is necessary to determine patient infection status.  The expected result is negative.  Fact Sheet for Patients:   StrictlyIdeas.no   Fact Sheet for Healthcare Providers:   BankingDealers.co.za    This test is not yet approved or cleared by the [place] FDA and  has been authorized for detection and/or diagnosis of SARS-CoV-2 by FDA under an Emergency Use Authorization (EUA).  This EUA will remain in effect (meaning this test can  be used) for the duration of  the COVID-19 declaration under Section 564(b)(1) of the Act, 21 U.S.C. section 360-bbb-3(b)(1), unless the authorization is terminated or revoked sooner.  Performed at [facility], [place] [address] [place], [place] [address]   Blood Culture (routine x 2)     Status: None (Preliminary result)   Collection Time: 03/29/20 10:50 PM   Specimen: BLOOD RIGHT FOREARM  Result Value Ref Range Status   Specimen Description BLOOD RIGHT FOREARM  Final   Special Requests   Final    BOTTLES DRAWN AEROBIC AND ANAEROBIC Blood Culture adequate volume   Culture   Final    NO GROWTH 4 DAYS Performed at [facility], [place] [address] [place], [place] [address]    Report Status PENDING  Incomplete    Today   Subjective    Jhayden Demuro today has no headache,no chest abdominal pain,no new weakness tingling or numbness, feels much better wants to go home today.    Objective   Blood pressure 180/90, pulse 77, temperature 97.9 F (36.6 C), resp. rate 20, height '5\' 3"'  (1.6 m), weight 67.2 kg, SpO2 93 %.   Intake/Output Summary (Last 24 hours) at 04/02/2020 0901 Last data filed at 04/01/2020 2010 Gross per 24 hour  Intake --  Output 2000 ml  Net -2000 ml  Exam  Awake Alert, No new F.N deficits, Normal affect [place].AT,PERRAL Supple Neck,No JVD, No cervical  lymphadenopathy appriciated.  Symmetrical Chest wall movement, Good air movement bilaterally, CTAB RRR,No Gallops,Rubs or new Murmurs, No Parasternal Heave +ve B.Sounds, Abd Soft, Non tender, No organomegaly appriciated, No rebound -guarding or rigidity. No Cyanosis, Clubbing or edema, No new Rash or bruise   Data Review   CBC w Diff:  Lab Results  Component Value Date   WBC 7.0 04/02/2020   HGB 10.6 (L) 04/02/2020   HGB 10.6 (L) 11/15/2019   HCT 32.4 (L) 04/02/2020   HCT 33.0 (L) 11/15/2019   PLT 139 (L) 04/02/2020   PLT 217 11/15/2019   LYMPHOPCT 20 03/29/2020   MONOPCT 11 03/29/2020   EOSPCT 0 03/29/2020   BASOPCT 0 03/29/2020    CMP:  Lab Results  Component Value Date   NA 132 (L) 04/02/2020   NA 138 11/15/2019   K 4.7 04/02/2020   CL 89 (L) 04/02/2020   CO2 26 04/02/2020   BUN 51 (H) 04/02/2020   BUN 34 (H) 11/15/2019   CREATININE 6.34 (H) 04/02/2020   PROT 7.3 04/02/2020   PROT 7.2 01/19/2020   ALBUMIN 3.1 (L) 04/02/2020   ALBUMIN 4.3 01/19/2020   BILITOT 0.6 04/02/2020   BILITOT 0.2 01/19/2020   ALKPHOS 100 04/02/2020   AST 22 04/02/2020   ALT 17 04/02/2020  .   Total Time in preparing paper work, data evaluation and todays exam - 17 minutes  Lala Lund M.D on 04/02/2020 at 9:01 AM  [facility]   Office  [phone]

## 2020-04-02 NOTE — Progress Notes (Signed)
BP meds given early per MD request.

## 2020-04-02 NOTE — Progress Notes (Signed)
Renal Navigator spoke with patient by phone with assistance from Spanish Interpreter/[name] to inform patient of COVID isolation shift schedule. He states understanding and no questions/concerns. He was informed that he should start this schedule tomorrow, Wednesday, 04/03/20. Clinic informed.  Alphonzo Cruise, [place] Renal Navigator [phone]

## 2020-04-02 NOTE — Discharge Instructions (Signed)
Follow with Primary MD Mack Hook, MD in 7 days   Get CBC, CMP, 2 view Chest X ray -  checked next visit within 1 week by Primary MD   Activity: As tolerated with Full fall precautions use walker/cane & assistance as needed  Disposition Home    Diet: Renal diet - Low Carb,  1.5lit/day fluid restriction  Special Instructions: If you have smoked or chewed Tobacco  in the last 2 yrs please stop smoking, stop any regular Alcohol  and or any Recreational drug use.  On your next visit with your primary care physician please Get Medicines reviewed and adjusted.  Please request your Prim.MD to go over all Hospital Tests and Procedure/Radiological results at the follow up, please get all Hospital records sent to your Prim MD by signing hospital release before you go home.  If you experience worsening of your admission symptoms, develop shortness of breath, life threatening emergency, suicidal or homicidal thoughts you must seek medical attention immediately by calling 911 or calling your MD immediately  if symptoms less severe.  You Must read complete instructions/literature along with all the possible adverse reactions/side effects for all the Medicines you take and that have been prescribed to you. Take any new Medicines after you have completely understood and accpet all the possible adverse reactions/side effects.      Person Under Monitoring Name: Blake Santana  Location: [facility] [address]   Infection Prevention Recommendations for Individuals Confirmed to have, or Being Evaluated for, 2019 Novel Coronavirus (COVID-19) Infection Who Receive Care at Home  Individuals who are confirmed to have, or are being evaluated for, COVID-19 should follow the prevention steps below until a healthcare provider or local or state health department says they can return to normal activities.  Stay home except to get medical care You should restrict activities  outside your home, except for getting medical care. Do not go to work, school, or public areas, and do not use public transportation or taxis.  Call ahead before visiting your doctor Before your medical appointment, call the healthcare provider and tell them that you have, or are being evaluated for, COVID-19 infection. This will help the healthcare provider's office take steps to keep other people from getting infected. Ask your healthcare provider to call the local or state health department.  Monitor your symptoms Seek prompt medical attention if your illness is worsening (e.g., difficulty breathing). Before going to your medical appointment, call the healthcare provider and tell them that you have, or are being evaluated for, COVID-19 infection. Ask your healthcare provider to call the local or state health department.  Wear a facemask You should wear a facemask that covers your nose and mouth when you are in the same room with other people and when you visit a healthcare provider. People who live with or visit you should also wear a facemask while they are in the same room with you.  Separate yourself from other people in your home As much as possible, you should stay in a different room from other people in your home. Also, you should use a separate bathroom, if available.  Avoid sharing household items You should not share dishes, drinking glasses, cups, eating utensils, towels, bedding, or other items with other people in your home. After using these items, you should wash them thoroughly with soap and water.  Cover your coughs and sneezes Cover your mouth and nose with a tissue when you cough or sneeze, or you  can cough or sneeze into your sleeve. Throw used tissues in a lined trash can, and immediately wash your hands with soap and water for at least 20 seconds or use an alcohol-based hand rub.  Wash your [facility] your hands often and thoroughly with soap and water for at  least 20 seconds. You can use an alcohol-based hand sanitizer if soap and water are not available and if your hands are not visibly dirty. Avoid touching your eyes, nose, and mouth with unwashed hands.   Prevention Steps for Caregivers and Household Members of Individuals Confirmed to have, or Being Evaluated for, COVID-19 Infection Being Cared for in the Home  If you live with, or provide care at home for, a person confirmed to have, or being evaluated for, COVID-19 infection please follow these guidelines to prevent infection:  Follow healthcare provider's instructions Make sure that you understand and can help the patient follow any healthcare provider instructions for all care.  Provide for the patient's basic needs You should help the patient with basic needs in the home and provide support for getting groceries, prescriptions, and other personal needs.  Monitor the patient's symptoms If they are getting sicker, call his or her medical provider and tell them that the patient has, or is being evaluated for, COVID-19 infection. This will help the healthcare provider's office take steps to keep other people from getting infected. Ask the healthcare provider to call the local or state health department.  Limit the number of people who have contact with the patient  If possible, have only one caregiver for the patient.  Other household members should stay in another home or place of residence. If this is not possible, they should stay  in another room, or be separated from the patient as much as possible. Use a separate bathroom, if available.  Restrict visitors who do not have an essential need to be in the home.  Keep older adults, very young children, and other sick people away from the patient Keep older adults, very young children, and those who have compromised immune systems or chronic health conditions away from the patient. This includes people with chronic heart, lung, or  kidney conditions, diabetes, and cancer.  Ensure good ventilation Make sure that shared spaces in the home have good air flow, such as from an air conditioner or an opened window, weather permitting.  Wash your hands often  Wash your hands often and thoroughly with soap and water for at least 20 seconds. You can use an alcohol based hand sanitizer if soap and water are not available and if your hands are not visibly dirty.  Avoid touching your eyes, nose, and mouth with unwashed hands.  Use disposable paper towels to dry your hands. If not available, use dedicated cloth towels and replace them when they become wet.  Wear a facemask and gloves  Wear a disposable facemask at all times in the room and gloves when you touch or have contact with the patient's blood, body fluids, and/or secretions or excretions, such as sweat, saliva, sputum, nasal mucus, vomit, urine, or feces.  Ensure the mask fits over your nose and mouth tightly, and do not touch it during use.  Throw out disposable facemasks and gloves after using them. Do not reuse.  Wash your hands immediately after removing your facemask and gloves.  If your personal clothing becomes contaminated, carefully remove clothing and launder. Wash your hands after handling contaminated clothing.  Place all used disposable facemasks, gloves,  and other waste in a lined container before disposing them with other household waste.  Remove gloves and wash your hands immediately after handling these items.  Do not share dishes, glasses, or other household items with the patient  Avoid sharing household items. You should not share dishes, drinking glasses, cups, eating utensils, towels, bedding, or other items with a patient who is confirmed to have, or being evaluated for, COVID-19 infection.  After the person uses these items, you should wash them thoroughly with soap and water.  Wash laundry thoroughly  Immediately remove and wash clothes  or bedding that have blood, body fluids, and/or secretions or excretions, such as sweat, saliva, sputum, nasal mucus, vomit, urine, or feces, on them.  Wear gloves when handling laundry from the patient.  Read and follow directions on labels of laundry or clothing items and detergent. In general, wash and dry with the warmest temperatures recommended on the label.  Clean all areas the individual has used often  Clean all touchable surfaces, such as counters, tabletops, doorknobs, bathroom fixtures, toilets, phones, keyboards, tablets, and bedside tables, every day. Also, clean any surfaces that may have blood, body fluids, and/or secretions or excretions on them.  Wear gloves when cleaning surfaces the patient has come in contact with.  Use a diluted bleach solution (e.g., dilute bleach with 1 part bleach and 10 parts water) or a household disinfectant with a label that says EPA-registered for coronaviruses. To make a bleach solution at home, add 1 tablespoon of bleach to 1 quart (4 cups) of water. For a larger supply, add  cup of bleach to 1 gallon (16 cups) of water.  Read labels of cleaning products and follow recommendations provided on product labels. Labels contain instructions for safe and effective use of the cleaning product including precautions you should take when applying the product, such as wearing gloves or eye protection and making sure you have good ventilation during use of the product.  Remove gloves and wash hands immediately after cleaning.  Monitor yourself for signs and symptoms of illness Caregivers and household members are considered close contacts, should monitor their health, and will be asked to limit movement outside of the home to the extent possible. Follow the monitoring steps for close contacts listed on the symptom monitoring form.   ? If you have additional questions, contact your local health department or call the epidemiologist on call at  [phone] (available 24/7). ? This guidance is subject to change. For the most up-to-date guidance from [facility], please refer to their website: [email or web address]

## 2020-04-03 LAB — CULTURE, BLOOD (ROUTINE X 2)
Culture: NO GROWTH
Special Requests: ADEQUATE

## 2020-04-05 DIAGNOSIS — Z8616 Personal history of COVID-19: Secondary | ICD-10-CM | POA: Insufficient documentation

## 2020-04-17 ENCOUNTER — Ambulatory Visit: Payer: Self-pay | Admitting: Internal Medicine

## 2020-04-18 ENCOUNTER — Ambulatory Visit: Payer: Self-pay | Admitting: Internal Medicine

## 2020-06-21 ENCOUNTER — Other Ambulatory Visit (HOSPITAL_COMMUNITY): Payer: Self-pay | Admitting: Nephrology

## 2020-06-21 DIAGNOSIS — N186 End stage renal disease: Secondary | ICD-10-CM

## 2020-06-24 ENCOUNTER — Other Ambulatory Visit: Payer: Self-pay | Admitting: Radiology

## 2020-06-25 ENCOUNTER — Ambulatory Visit (HOSPITAL_COMMUNITY): Admission: RE | Admit: 2020-06-25 | Payer: Self-pay | Source: Ambulatory Visit

## 2020-06-25 ENCOUNTER — Encounter (HOSPITAL_COMMUNITY): Payer: Self-pay

## 2020-07-09 ENCOUNTER — Other Ambulatory Visit: Payer: Self-pay | Admitting: Student

## 2020-07-10 ENCOUNTER — Encounter (HOSPITAL_COMMUNITY): Payer: Self-pay

## 2020-07-10 ENCOUNTER — Ambulatory Visit (HOSPITAL_COMMUNITY)
Admission: RE | Admit: 2020-07-10 | Discharge: 2020-07-10 | Disposition: A | Discharge status: Home / Self Care | Payer: Self-pay | Source: Ambulatory Visit | Attending: Nephrology | Admitting: Nephrology

## 2020-07-10 ENCOUNTER — Other Ambulatory Visit: Payer: Self-pay

## 2020-07-10 DIAGNOSIS — Z7984 Long term (current) use of oral hypoglycemic drugs: Secondary | ICD-10-CM | POA: Insufficient documentation

## 2020-07-10 DIAGNOSIS — Z7982 Long term (current) use of aspirin: Secondary | ICD-10-CM | POA: Insufficient documentation

## 2020-07-10 DIAGNOSIS — Z79899 Other long term (current) drug therapy: Secondary | ICD-10-CM | POA: Insufficient documentation

## 2020-07-10 DIAGNOSIS — N186 End stage renal disease: Secondary | ICD-10-CM | POA: Insufficient documentation

## 2020-07-10 DIAGNOSIS — Z992 Dependence on renal dialysis: Secondary | ICD-10-CM | POA: Insufficient documentation

## 2020-07-10 DIAGNOSIS — Z87891 Personal history of nicotine dependence: Secondary | ICD-10-CM | POA: Insufficient documentation

## 2020-07-10 HISTORY — PX: IR DIALY SHUNT INTRO NEEDLE/INTRACATH INITIAL W/IMG LEFT: IMG6102

## 2020-07-10 LAB — GLUCOSE, CAPILLARY: Glucose-Capillary: 134 mg/dL — ABNORMAL HIGH (ref 70–99)

## 2020-07-10 LAB — POTASSIUM: Potassium: 4.5 mmol/L (ref 3.5–5.1)

## 2020-07-10 MED ORDER — IOHEXOL 300 MG/ML  SOLN
100.0000 mL | Freq: Once | INTRAMUSCULAR | Status: AC | PRN
  Administered 2020-07-10: 35 mL

## 2020-07-10 MED ORDER — SODIUM CHLORIDE 0.9 % IV SOLN: INTRAVENOUS | Status: DC

## 2020-07-10 NOTE — H&P (Signed)
Chief Complaint: Patient was seen in consultation today for left upper extremity dialysis fistula evaluation/intervention at the request of [place]  Referring Physician(s): Rivereno  Supervising Physician: Aletta Edouard  Patient Status: [facility] - Out-pt  History of Present Illness: Blake Santana is a 49 y.o. male   ESRD Initial upper extremity low left arm fistula placed 08/2017 Malfunction per pt New upper left arm fistula placed 01/2018 Has used this fistula successfully for 2 yrs. No interventions that he recalls Recent pain with dialysis Decreased access flows per Dr Hollie Salk  Last dialysis yesterday--- complete  Scheduled today for left upper arm fistulogram with possible intervention    Past Medical History:  Diagnosis Date  . Acute respiratory failure with hypoxia ([place]) 09/07/2018  . Anemia of chronic kidney failure 03/11/2017  . Cataracts, bilateral 2019  . Chest pain 09/01/2018  . CHF (congestive heart failure) ([place])   . Chronic kidney disease 03/11/2017  . Diabetes mellitus without complication ([place]) 1779  . Edema 01/14/2017  . Headache   . Hypercholesterolemia   . Hypertension 03/11/2017  . Hypertensive retinopathy of both eyes 2019  . Iron deficiency anemia 06/18/2016  . Pneumonia 09/07/2018  . Proliferative diabetic retinopathy with macular edema ([place]) 2019   Retinal hemorrhage, bilateral and left vitreous hemorrhage.  Being treated with Lucentis.  Dr. Iona Hansen  . RSV (respiratory syncytial virus infection) 08/2017    Past Surgical History:  Procedure Laterality Date  . A/V FISTULAGRAM Left 01/19/2018   Procedure: A/V FISTULAGRAM - Left AV;  Surgeon: Waynetta Sandy, MD;  Location: [facility];  Service: Cardiovascular;  Laterality: Left;  . AV FISTULA PLACEMENT Left 08/09/2017   Procedure: LEFT RADIOCEPHALIC ARTERIOVENOUS (AV) FISTULA CREATION;  Surgeon: [name], MD;  Location: [facility] OR;  Service: Vascular;   Laterality: Left;  . AV FISTULA PLACEMENT Left 02/02/2018   Procedure: ARTERIOVENOUS (AV) FISTULA CREATION LEFT UPPER EXTREMITY;  Surgeon: [name], MD;  Location: [facility];  Service: Vascular;  Laterality: Left;  . INSERTION OF DIALYSIS CATHETER  08/09/2017  . INSERTION OF DIALYSIS CATHETER Right 08/09/2017   Procedure: INSERTION OF DIALYSIS CATHETER RIGHT INTERNAL JUGULAR;  Surgeon: [name], MD;  Location: [facility] OR;  Service: Vascular;  Laterality: Right;  . IR REMOVAL TUN CV CATH W/O FL  07/01/2018  . LEFT HEART CATH AND CORONARY ANGIOGRAPHY N/A 09/05/2018   Procedure: LEFT HEART CATH AND CORONARY ANGIOGRAPHY;  Surgeon: [name], MD;  Location: [facility] [facility];  Service: Cardiovascular;  Laterality: N/A;    Allergies: Patient has no known allergies.  Medications: Prior to Admission medications   Medication Sig Start Date End Date Taking? Authorizing Provider  amLODipine (NORVASC) 5 MG tablet Take 1 tablet by mouth once daily with breakfast Patient taking differently: Take 5 mg by mouth daily.  10/18/19  Yes Mack Hook, MD  b complex-vitamin c-folic acid (NEPHRO-VITE) 0.8 MG TABS tablet Take 1 tablet by mouth Every Tuesday,Thursday,and Saturday with dialysis.  08/11/18  Yes [provider]  glipiZIDE (GLUCOTROL) 5 MG tablet Take 1 tablet (5 mg total) by mouth 2 (two) times daily with a meal. 10/18/19  Yes Mack Hook, MD  lanthanum (FOSRENOL) 1000 MG chewable tablet Chew 1,000 mg by mouth 3 (three) times daily with meals.   Yes [provider]  metoprolol tartrate (LOPRESSOR) 25 MG tablet TAKE 1/2 (ONE-HALF) TABLET BY MOUTH TWICE DAILY WITH MEALS Patient taking differently: Take 12.5 mg by mouth 2 (two) times daily.  10/18/19  Yes Mack Hook, MD  rosuvastatin (CRESTOR) 20 MG tablet Take 1 tablet (20 mg total) by mouth daily at 6 PM. 10/18/19  Yes Mack Hook, MD  acetaminophen (TYLENOL) 500 MG tablet Take 1,000 mg by mouth every 6  (six) hours as needed for moderate pain or headache.    [provider]  AgaMatrix Ultra-Thin Lancets MISC Check blood glucose twice daily before meals. Patient not taking: Reported on 03/30/2020 10/18/19   Mack Hook, MD  aspirin EC 81 MG EC tablet Take 1 tablet (81 mg total) by mouth daily. Patient not taking: Reported on 10/18/2019 09/08/18   Mina Marble P, DO  Blood Glucose Monitoring Suppl [facility] PRESTO) w/Device KIT Check sugars twice daily 06/04/16   Mack Hook, MD  glucose blood (AGAMATRIX PRESTO TEST) test strip Check sugars twice daily 10/18/19   Mack Hook, MD  loperamide (IMODIUM A-D) 2 MG tablet Take 2 mg by mouth 4 (four) times daily as needed for diarrhea or loose stools.    [provider]  sucroferric oxyhydroxide (VELPHORO) 500 MG chewable tablet Chew 2 tablets (1,000 mg total) by mouth 3 (three) times daily with meals. Patient not taking: Reported on 06/24/2020 10/18/19   Mack Hook, MD     Family History  Problem Relation Age of Onset  . Diabetes Mother   . Kidney disease Mother        renal failure--diabetes, cause of death  . Hypertension Mother   . Diabetes Father   . Kidney disease Father        Cause of death:  kidney failure form diabetes  . Alcohol abuse Father   . Diabetes Sister     Social History   Socioeconomic History  . Marital status: Married    Spouse name: Blake Santana  . Number of children: 1  . Years of education: 32  . Highest education level: Not on file  Occupational History  . Occupation: Painter  Tobacco Use  . Smoking status: Former Smoker    Types: Cigarettes  . Smokeless tobacco: Never Used  Vaping Use  . Vaping Use: Never used  Substance and Sexual Activity  . Alcohol use: Not Currently  . Drug use: No  . Sexual activity: Not on file  Other Topics Concern  . Not on file  Social History Narrative   Originally from [place]   Came to [employer]. In [place] with his  girlfriend and their son.   Social Determinants of Health   Financial Resource Strain:   . Difficulty of Paying Living Expenses: Not on file  Food Insecurity:   . Worried About Charity fundraiser in the Last Year: Not on file  . Ran Out of Food in the Last Year: Not on file  Transportation Needs:   . Lack of Transportation (Medical): Not on file  . Lack of Transportation (Non-Medical): Not on file  Physical Activity:   . Days of Exercise per Week: Not on file  . Minutes of Exercise per Session: Not on file  Stress:   . Feeling of Stress : Not on file  Social Connections:   . Frequency of Communication with Friends and Family: Not on file  . Frequency of Social Gatherings with Friends and Family: Not on file  . Attends Religious Services: Not on file  . Active Member of Clubs or Organizations: Not on file  . Attends Archivist Meetings: Not on file  . Marital Status: Not on file  Review of Systems: A 12 point ROS discussed and pertinent positives are indicated in the HPI above.  All other systems are negative.  Review of Systems  Constitutional: Negative for activity change, fatigue and fever.  Respiratory: Negative for cough and shortness of breath.   Cardiovascular: Negative for chest pain.  Gastrointestinal: Negative for abdominal pain.  Psychiatric/Behavioral: Negative for behavioral problems and confusion.    Vital Signs: BP (!) 170/83   Pulse 69   Temp 99 F (37.2 C) (Oral)   Ht '5\' 3"'  (1.6 m)   Wt 141 lb 1.5 oz (64 kg)   SpO2 98%   BMI 24.99 kg/m   Physical Exam Vitals reviewed.  Constitutional:      Comments: Spanish interpreter at bedside  HENT:     Mouth/Throat:     Mouth: Mucous membranes are moist.  Cardiovascular:     Rate and Rhythm: Normal rate and regular rhythm.     Heart sounds: Normal heart sounds.  Pulmonary:     Effort: Pulmonary effort is normal.     Breath sounds: Normal breath sounds.  Abdominal:     Palpations: Abdomen  is soft.  Musculoskeletal:        General: Normal range of motion.     Comments: Left upper arm fistula Good thrill No pulse  Skin:    General: Skin is warm.  Neurological:     Mental Status: He is alert and oriented to person, place, and time.  Psychiatric:        Behavior: Behavior normal.     Imaging: No results found.  Labs:  CBC: Recent Labs    03/29/20 2135 03/31/20 0204 04/01/20 0310 04/02/20 0431  WBC 6.7 7.3 8.4 7.0  HGB 9.5* 9.7* 9.8* 10.6*  HCT 30.0* 30.2* 29.5* 32.4*  PLT 125* 127* 138* 139*    COAGS: No results for input(s): INR, APTT in the last 8760 hours.  BMP: Recent Labs    03/29/20 2135 03/31/20 0204 04/01/20 0310 04/02/20 0431  NA 136 133* 133* 132*  K 4.5 6.1* 5.9* 4.7  CL 94* 92* 91* 89*  CO2 26 20* 24 26  GLUCOSE 77 250* 306* 435*  BUN 46* 91* 83* 51*  CALCIUM 8.7* 9.0 8.5* 8.9  CREATININE 10.43* 13.97* 9.77* 6.34*  GFRNONAA 5* 4* 6* 9*  GFRAA 6* 4* 6* 11*    LIVER FUNCTION TESTS: Recent Labs    03/29/20 2300 03/31/20 0204 04/01/20 0310 04/02/20 0431  BILITOT 1.1 0.7 0.5 0.6  AST '16 17 20 22  ' ALT '15 14 15 17  ' ALKPHOS 115 113 99 100  PROT 7.1 7.0 6.8 7.3  ALBUMIN 3.5 3.1* 3.0* 3.1*    TUMOR MARKERS: No results for input(s): AFPTM, CEA, CA199, CHROMGRNA in the last 8760 hours.  Assessment and Plan:  ESRD Left upper arm fistula painful last few sessions Decreased access flow per MD Scheduled for fistula evaluation and possible intervention in IR Pt is aware of procedure benefits and risks Including but not limited to infection; bleeding; vessel damage; damage to surrounding structures. Agreeable to proceed Consent signed with use of interpreter at bedside   Thank you for this interesting consult.  I greatly enjoyed meeting Huzaifa Viney Acevedo-Ocejo and look forward to participating in their care.  A copy of this report was sent to the requesting provider on this date.  Electronically Signed: Lavonia Drafts,  PA-C 07/10/2020, 9:07 AM   I spent a total of  30 Minutes   in  face to face in clinical consultation, greater than 50% of which was counseling/coordinating care for left upper arm fistula evaluation

## 2020-07-10 NOTE — Sedation Documentation (Signed)
No intervention needed per Dr. Laural Roes.  Patient will be discharged from IR nurse station.  Interpreter at bedside with Laural Roes and RN for patient.

## 2020-07-22 IMAGING — DX ABDOMEN - 1 VIEW
1 series · 1 of 1 positions shown · non-contrast
Comparison: None.

CLINICAL DATA: OG tube placement.

EXAM:
ABDOMEN - 1 VIEW

[abdomen kub]
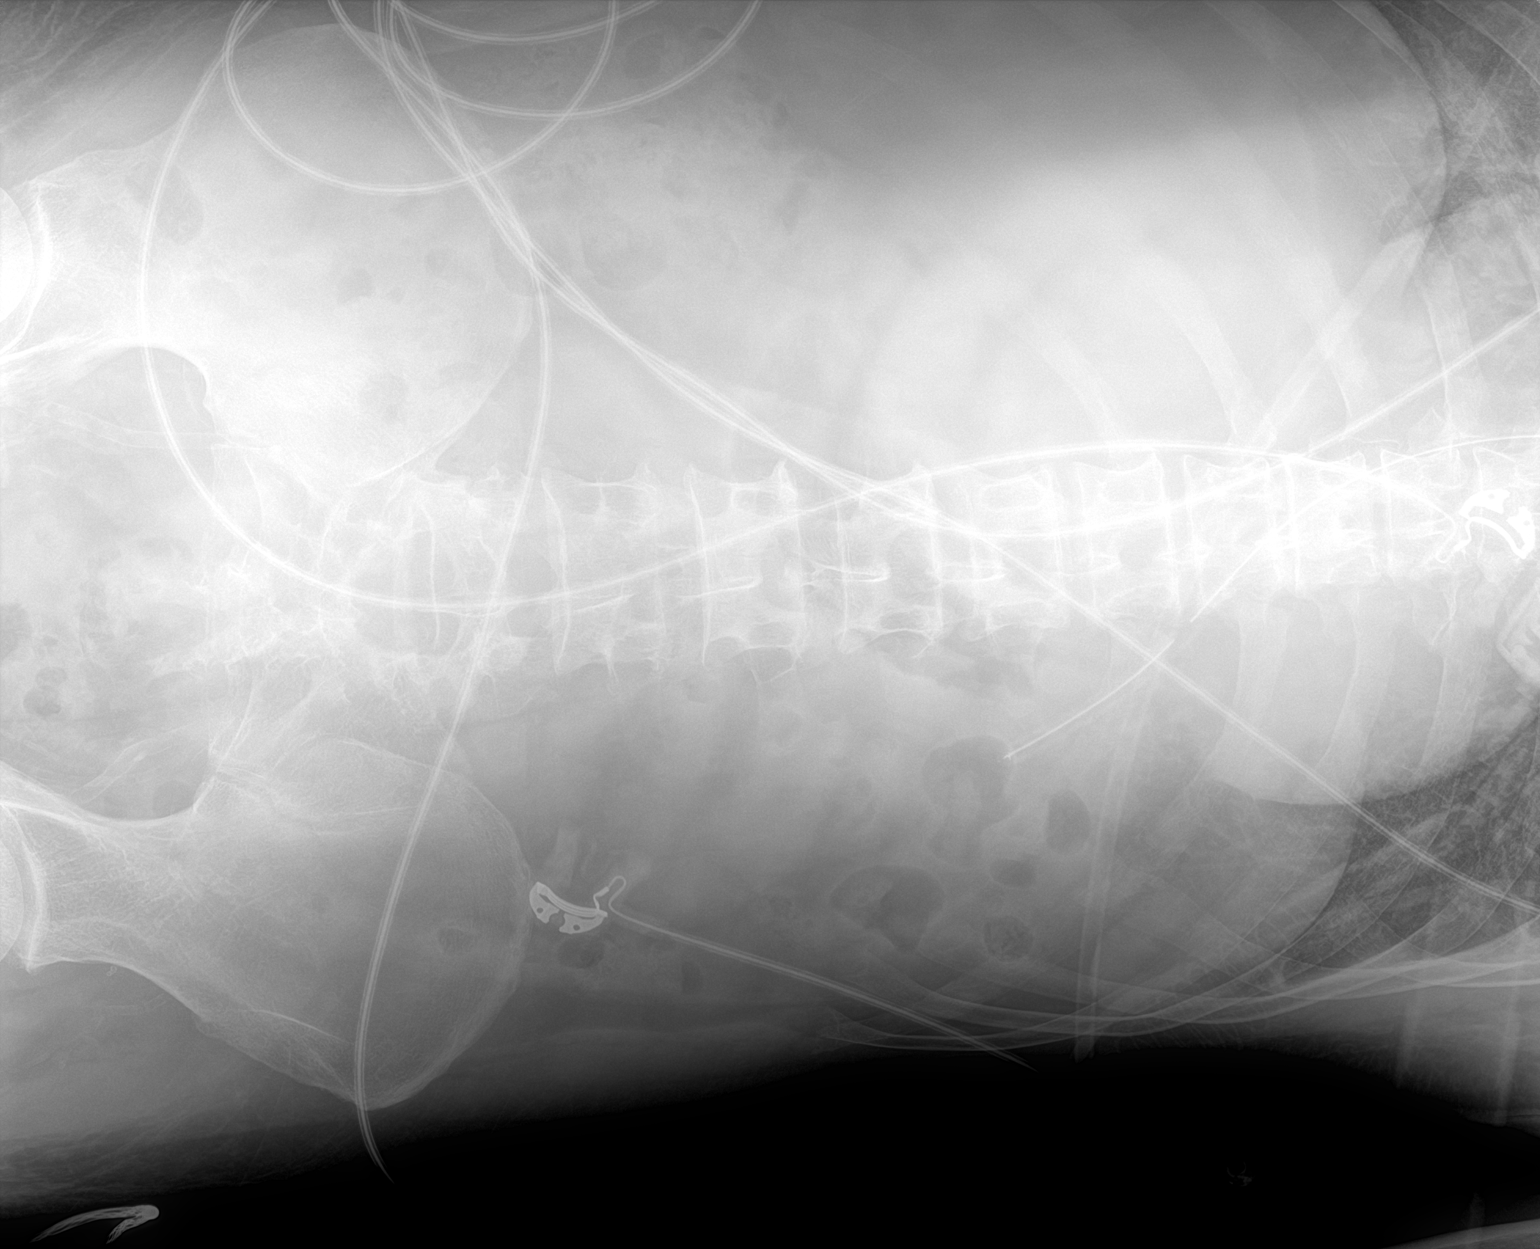

[1 of 1 positions shown; findings below may reference images not displayed]

FINDINGS: Enteric tube in the stomach. The bowel gas pattern is normal. No
radio-opaque calculi or other significant radiographic abnormality
are seen. No acute osseous abnormality.
IMPRESSION: 1. Enteric tube in the stomach.

## 2020-08-03 DIAGNOSIS — U071 COVID-19: Secondary | ICD-10-CM

## 2020-08-03 HISTORY — DX: COVID-19: U07.1

## 2020-11-20 ENCOUNTER — Encounter: Payer: Self-pay | Admitting: Family

## 2020-11-20 ENCOUNTER — Ambulatory Visit (INDEPENDENT_AMBULATORY_CARE_PROVIDER_SITE_OTHER): Payer: Self-pay | Admitting: Family

## 2020-11-20 ENCOUNTER — Other Ambulatory Visit: Payer: Self-pay

## 2020-11-20 VITALS — BP 208/97 | HR 75 | Ht 64.45 in | Wt 141.8 lb

## 2020-11-20 DIAGNOSIS — Z7689 Persons encountering health services in other specified circumstances: Secondary | ICD-10-CM

## 2020-11-20 DIAGNOSIS — I10 Essential (primary) hypertension: Secondary | ICD-10-CM

## 2020-11-20 DIAGNOSIS — Z789 Other specified health status: Secondary | ICD-10-CM

## 2020-11-20 DIAGNOSIS — D235 Other benign neoplasm of skin of trunk: Secondary | ICD-10-CM

## 2020-11-20 NOTE — Progress Notes (Signed)
Establish care mass in middle of back-hard  Has one on left side that does not grow similar to one on back

## 2020-11-20 NOTE — Patient Instructions (Addendum)
Regrese para el examen fsico anual, laboratorios y mantenimiento de [employer]	[place]. Llegue en ayunas, lo que significa no tener por lo menos 8 horas antes de la cita. Puede tomar solo agua o caf negro. Tome los medicamentos programados con normalidad.  Remisin a [place] por quiste de espalda.  Regrese en 2 semanas para control de presin arterial. Gracias por elegir Primary Care at [facility] su hogar mdico!  Blake Santana fue visto hoy por [name], NP.  El proveedor de atencin primaria de [place] Santana es Reader, NP. Para obtener la mejor atencin posible, debe tratar de ver a Durene Fruits, NP cada vez que vengas a la clnica.  Esperamos verte de nuevo pronto!  Si tiene [employer] su visita de hoy, por favor llmenos al [phone]  O no dude en comunicarse con su proveedor a [employer].  Plan de alimentacin DASH DASH Eating Plan DASH es la sigla en ingls de "Enfoques Alimentarios para Detener la Hipertensin". El plan de alimentacin DASH ha demostrado:  Bajar la presin arterial elevada (hipertensin).  Reducir el riesgo de diabetes tipo 2, enfermedad cardaca y accidente cerebrovascular.  Ayudar a perder peso. Consejos para seguir Photographer las etiquetas de los alimentos  Verifique la cantidad de sal (sodio) por porcin en las etiquetas de los alimentos. Elija alimentos con menos del 5 por ciento del valor diario de sodio. Generalmente, los alimentos con menos de 300 miligramos (mg) de sodio por porcin se encuadran dentro de este plan alimentario.  Para encontrar cereales integrales, busque la palabra "integral" como primera palabra en la lista de ingredientes. Al ir de compras  Compre productos en los que en su etiqueta diga: "bajo contenido de sodio" o "sin agregado de sal".  Compre alimentos frescos. Evite los alimentos enlatados y comidas precocidas o congeladas. Al cocinar  Evite  agregar sal cuando cocine. Use hierbas o aderezos sin sal, en lugar de sal de mesa o sal marina. Consulte al mdico o farmacutico antes de usar sustitutos de la sal.  No fra los alimentos. A la hora de cocinar los alimentos opte por hornearlos, hervirlos, grillarlos, asarlos al horno y asarlos a Administrator, arts.  Cocine con aceites cardiosaludables, como oliva, canola, aguacate, soja o girasol. Planificacin de las comidas  Consuma una dieta equilibrada, que incluya lo siguiente: ? 4o ms porciones de frutas y 4 o ms porciones de Set designer. Trate de que medio plato de cada comida sea de frutas y verduras. ? De 6 a 8porciones de cereales [employer]. ? Menos de 6 onzas (170g) de carne, aves o pescado Games developer. Una porcin de 3 onzas (85g) de carne tiene casi el mismo tamao que un mazo de cartas. Un huevo equivale a 1 onza (28g). ? De 2 a 3 porciones de productos lcteos descremados por da. Una porcin es 1taza (272ml). ? 1 porcin de frutos secos, semillas o frijoles 5 veces por semana. ? De 2 a 3 porciones de grasas cardiosaludables. Las grasas saludables llamadas cidos grasos omega-3 se encuentran en alimentos como las nueces, las semillas de [place], las leches fortificadas y [place]. Estas grasas tambin se encuentran en los pescados de agua fra, como la sardina, el salmn y la caballa.  Limite la cantidad que consume de: ? Alimentos enlatados o envasados. ? Alimentos con alto contenido de grasa trans, como algunos alimentos fritos. ? Alimentos con alto contenido de grasa saturada, [place]  carne con grasa. ? Postres y otros dulces, bebidas azucaradas y otros alimentos con azcar agregada. ? Productos lcteos enteros.  No le agregue sal a los alimentos antes de probarlos.  No coma ms de 4 yemas de huevo por semana.  Trate de comer al menos 2 comidas vegetarianas por semana.  Consuma ms comida casera y menos de restaurante, de bares y comida rpida.    Estilo de vida  Cuando coma en un restaurante, pida que preparen su comida con menos sal o, en lo posible, sin nada de sal.  Si bebe alcohol: ? Limite la cantidad que bebe:  De 0 a 1 medida por da para las mujeres que no estn embarazadas.  De 0 a 2 medidas por da para los hombres. ? Est atento a la cantidad de alcohol que hay en las bebidas que toma. En los [place], una medida equivale a una botella de cerveza de 12oz (370ml), un vaso de vino de 5oz (121ml) o un vaso de una bebida alcohlica de alta graduacin de 1oz (58ml). Informacin general  Evite ingerir ms de 2300 mg de sal por da. Si tiene hipertensin, es posible que necesite reducir la ingesta de sodio a 1,500 mg por da.  Trabaje con su mdico para mantener un peso saludable o perder [employer]. Pregntele cul es el peso recomendado para usted.  Realice al menos 30 minutos de ejercicio que haga que se acelere su corazn (ejercicio Arboriculturist) la [employer] de la [place]. Estas actividades pueden incluir caminar, nadar o andar en bicicleta.  Trabaje con su mdico o nutricionista para ajustar su plan alimentario a sus necesidades calricas personales. Qu alimentos debo comer? Frutas Todas las frutas frescas, congeladas o disecadas. Frutas enlatadas en jugo natural (sin agregado de azcar). Verduras Verduras frescas o congeladas (crudas, al vapor, asadas o grilladas). Jugos de tomate y verduras con bajo contenido de sodio o reducidos en sodio. Salsa y pasta de tomate con bajo contenido de sodio o reducidas en sodio. Verduras enlatadas con bajo contenido de sodio o reducidas en sodio. Granos Pan de salvado o integral. Pasta de salvado o integral. Arroz integral. Avena. Quinua. Trigo burgol. Cereales integrales y con bajo contenido de sodio. Pan pita. Galletitas de [place] con bajo contenido de [place] y [place]. Tortillas de [place] integral. Carnes y otras protenas Pollo o pavo sin piel. Carne de pollo o de [place]. Cerdo desgrasado. Pescado y [employer]. Claras de huevo. Porotos, guisantes o lentejas secos. Frutos secos, mantequilla de frutos secos y semillas sin sal. Frijoles enlatados sin sal. Cortes de carne vacuna magra, desgrasada. Carne precocida o curada magra y baja en sodio, como embutidos o panes de carne. Lcteos Leche descremada (1%) o descremada. Quesos reducidos en grasa, con bajo contenido de grasa o descremados. Queso blanco o ricota sin grasa, con bajo contenido de [place]. Yogur semidescremado o descremado. Queso con bajo contenido de [place] y [place]. Grasas y [employer] untables que no contengan grasas trans. Aceite vegetal. Lubertha Basque y aderezos para ensaladas livianos, reducidos en grasa o con bajo contenido de grasas (reducidos en sodio). Aceite de canola, crtamo, oliva, aguacate, soja y [place]. [place]. Alios y condimentos Hierbas. Especias. Mezclas de condimentos sin sal. Otros alimentos Palomitas de maz y pretzels sin sal. Dulces con bajo contenido de grasas. Es posible que los productos que se enumeran ms [place] no constituyan una lista completa de los alimentos y las bebidas que puede tomar. Consulte a un nutricionista para obtener ms informacin. Qu alimentos  debo evitar? Lambert Mody Fruta enlatada en almbar liviano o espeso. Frutas cocidas en aceite. Frutas con salsa de crema o mantequilla. Verduras Verduras con crema o fritas. Verduras en [place]. Verduras enlatadas regulares (que no sean con bajo contenido de sodio o reducidas en sodio). Pasta y salsa de tomates enlatadas regulares (que no sean con bajo contenido de sodio o reducidas en sodio). Jugos de tomate y verduras regulares (que no sean con bajo contenido de sodio o reducidos en sodio). Pepinillos. Aceitunas. Granos Productos de panificacin hechos con grasa, como medialunas, magdalenas y algunos panes. Comidas con arroz o pasta seca listas para usar. Carnes y otras protenas Cortes de carne con alto  contenido de Lobbyist. Costillas. Carne frita. [place]. [place], salame y otras carnes precocidas o curadas, como embutidos o panes de carne. Grasa de la espalda del cerdo (panceta). [place]. Frutos secos y semillas con sal. Frijoles enlatados con agregado de sal. Pescado enlatado o ahumado. Huevos enteros o yemas. Pollo o pavo con piel. Lcteos Leche entera o al 2%, crema y mitad leche y mitad crema. Queso crema entero o con toda su grasa. Yogur entero o endulzado. Quesos con toda su grasa. Sustitutos de cremas no lcteas. Coberturas batidas. Quesos para untar y quesos procesados. Grasas y [employer]. Margarina en barra. [place]. Lardo. Mantequilla clarificada. Grasa de panceta. Aceites tropicales como aceite de coco, palmiste o palma. Alios y condimentos Sal de cebolla, sal de ajo, sal condimentada, sal de mesa y sal marina. Salsa [place]. Salsa trtara. Salsa barbacoa. Salsa teriyaki. Salsa de soja, incluso la que tiene contenido reducido de [place]. Salsa de carne. Salsas en lata y envasadas. Salsa de pescado. Salsa de [place]. Salsa rosada. Rbanos picantes comprados en tiendas. [place]. [place]. Saborizantes y tiernizantes para carne. Caldo en cubitos. Salsas picantes. Adobos preelaborados o envasados. Aderezos para tacos preelaborados o envasados. Salsas de pepinillos. Aderezos comunes para ensalada. Otros alimentos Palomitas de maz y pretzels con sal. Es posible que los productos que se enumeran ms arriba no constituyan una lista completa de los alimentos y las bebidas que Nurse, adult. Consulte a un nutricionista para obtener ms informacin. Dnde buscar ms informacin  National Heart, Lung, and Blood Institute ([place], los Pulmones y Herbalist): https://wilson-eaton.com/  American Heart Association (Asociacin Estadounidense del Corazn): www.heart.org  Academy of Nutrition and Dietetics (Academia de Nutricin y Information systems manager):  www.eatright.[place] ([place]): www.kidney.org Resumen  El plan de alimentacin DASH ha demostrado bajar la presin arterial elevada (hipertensin). Tambin puede reducir UnitedHealth de diabetes tipo 2, enfermedad cardaca y accidente cerebrovascular.  Cuando siga el plan de alimentacin DASH, trate de comer ms frutas frescas y verduras, cereales integrales, carnes magras, lcteos descremados y grasas cardiosaludables.  Con el plan de alimentacin DASH, deber limitar el consumo de sal (sodio) a 2,300 mg por da. Si tiene hipertensin, es posible que necesite reducir la ingesta de sodio a 1,500 mg por da.  Trabaje con su mdico o nutricionista para ajustar su plan alimentario a sus necesidades calricas personales. Esta informacin no tiene Marine scientist el consejo del mdico. Asegrese de hacerle al mdico cualquier pregunta que tenga. Document Revised: 08/24/2019 Document Reviewed: 08/24/2019 Elsevier Patient Education  2021 [employer].

## 2020-11-20 NOTE — Progress Notes (Addendum)
Subjective:    Blake Santana - 50 y.o. male MRN [id number]  Date of birth: 02/02/1971  HPI  Blake Santana is to establish care. Patient has a PMH significant for hypertension, acute exacerbation of congestive heart failure, community acquired pneumonia, diabetes mellitus with ERSD, iron deficiency anemia, dyslipidemia, and bilateral cataracts.   Current issues and/or concerns: 1. BACK CYST: Duration: months Location: middle lower  Pain:  Only with touching or when pressure applied Redness:  no Warmth:  no Oozing:  no Pus:  no Treatments attempted:Attempted to pop by squeezing.  Fevers:  no Nausea/vomiting:  no  Comments: Also has one of similar presentation on left upper abdomen for several years. Has not grown in size.  2. HYPERTENSION: Visit 10/18/2019 at [facility] per MD note:  Undergoing dialysis 3 times weekly for 4 hours.  His blood pressure can drop precipitously, so Nephrology has not increased his bp meds. Continues on Metoprolol 12.5 mg twice daily and amlodipine 5 mg daily.      11/20/2020:  Currently taking: see medication list Have you taken your blood pressure medication today: []  Yes [x]  No  Med Adherence: [x]  Yes    []  No Medication side effects: []  Yes    [x]  No Adherence with salt restriction (low-salt diet): [x]  Yes    []  No Exercise: Yes []  No [x]   Home Monitoring?: [x]  Yes    []  No Monitoring Frequency: []  Yes    [x]  No usually checking when feeling bad. Dialysis checking on Tuesday, Thursday, Saturday Home BP results range: []  Yes    [x]  No Smoking []  Yes [x]  No SOB? []  Yes    [x]  No Chest Pain?: []  Yes    [x]  No, does have intermittent left shoudler pain Leg swelling?: []  Yes    [x]  No, dialysis helps keep this stable Headaches?: []  Yes    [x]  No Dizziness? []  Yes    [x]  No   ROS per HPI    Health Maintenance:  Health Maintenance Due  Topic Date Due  . COLONOSCOPY (Pts 45-63yrs Insurance coverage will  need to be confirmed)  Never done  . FOOT EXAM  07/15/2018  . OPHTHALMOLOGY EXAM  06/14/2019  . COVID-19 Vaccine (3 - Pfizer risk 4-dose series) 12/05/2019  . HEMOGLOBIN A1C  09/30/2020    Past Medical History: Patient Active Problem List   Diagnosis Date Noted  . Pneumonia due to COVID-19 virus 03/30/2020  . Acute respiratory failure due to COVID-19 ([place]) 03/30/2020  . Sepsis ([place]) 03/30/2020  . Noncompliance 02/24/2020  . TB (tuberculosis) contact 11/27/2018  . COVID-19 virus detected   . Hypoxia   . Abnormal EKG   . Volume overload 09/02/2018  . Pleural effusion, bilateral 09/02/2018  . Atypical chest pain 09/01/2018  . Acute respiratory failure with hypoxia ([place]) 09/01/2018  . Community acquired pneumonia   . Elevated troponin   . Malnutrition of moderate degree 08/11/2017  . RSV (respiratory syncytial virus infection) 08/09/2017  . ESRD (end stage renal disease) on dialysis ([place]) 08/08/2017  . Proliferative diabetic retinopathy with macular edema ([place]) 08/03/2017  . Hypertensive retinopathy of both eyes 08/03/2017  . Cataracts, bilateral 08/03/2017  . Acute exacerbation of CHF (congestive heart failure) ([place]) 04/03/2017  . Anemia of chronic kidney failure 03/11/2017  . Hypertension 03/11/2017  . Chronic kidney disease 03/11/2017  . Edema 01/14/2017  . Dyslipidemia 10/15/2016  . Diabetic retinopathy ([place]) 10/15/2016  . Microalbuminuria 06/18/2016  . Normocytic anemia 06/18/2016  .  Iron deficiency anemia 06/18/2016  . Diabetes mellitus with ESRD (end-stage renal disease) ([place]) 10/20/2001    Social History   reports that he has quit smoking. His smoking use included cigarettes. He has never used smokeless tobacco. He reports previous alcohol use. He reports that he does not use drugs.   Family History  family history includes Alcohol abuse in his father; Diabetes in his father, mother, and sister; Hypertension in his mother; Kidney disease in his father and mother.    Medications: reviewed and updated   Objective:   Physical Exam BP (!) 208/97 (BP Location: Left Arm, Patient Position: Sitting)   Pulse 75   Ht 5' 4.45" (1.637 m)   Wt 141 lb 12.8 oz (64.3 kg)   SpO2 96%   BMI 24.00 kg/m  Physical Exam HENT:     Head: Normocephalic and atraumatic.  Eyes:     Extraocular Movements: Extraocular movements intact.     Conjunctiva/sclera: Conjunctivae normal.     Pupils: Pupils are equal, round, and reactive to light.  Cardiovascular:     Rate and Rhythm: Normal rate and regular rhythm.     Pulses: Normal pulses.     Heart sounds: Normal heart sounds.  Pulmonary:     Effort: Pulmonary effort is normal.     Breath sounds: Normal breath sounds.  Musculoskeletal:     Cervical back: Normal range of motion and neck supple.       Back:  Skin:      Neurological:     General: No focal deficit present.     Mental Status: He is alert and oriented to person, place, and time.  Psychiatric:        Mood and Affect: Mood normal.        Behavior: Behavior normal.        Assessment & Plan:  1. Encounter to establish care: - Patient presents today to establish care.  - Return for annual physical examination, labs, and health maintenance. Arrive fasting meaning having no for at least 8 hours prior to appointment. You may have only water or black coffee. Please take scheduled medications as normal.  2. Dermoid cyst of skin of back: -  Present at midline lower back for months.  - Also has one of similar presentation on left upper abdomen for several years. Has not grown in size. - Referral to General Surgery for further evaluation and management. - Ambulatory referral to General Surgery  3. Essential hypertension: - Blood pressure not at goal during today's visit. Patient asymptomatic without chest pressure, chest pain, palpitations, shortness of breath, and worst headache of life. - Patient has not taken blood pressure medications for today as of  yet. Assures that he is returning home soon to take medications following office visit. - Continue Amlodipine and Metoprolol as prescribed.  - Follow-up in 1 to 2 weeks for blood pressure check. Write down your blood pressure readings each day and bring those results along with your home blood pressure monitor to your appointment.   4. Language barrier: - [place] Interpreter, Eddie, participated during today's visit.    Patient was given clear instructions to go to Emergency Department or return to medical center if symptoms don't improve, worsen, or new problems develop.The patient verbalized understanding.  I discussed the assessment and treatment plan with the patient. The patient was provided an opportunity to ask questions and all were answered. The patient agreed with the plan and demonstrated an understanding of the instructions.  The patient was advised to call back or seek an in-person evaluation if the symptoms worsen or if the condition fails to improve as anticipated.    Durene Fruits, NP 11/20/2020, 12:17 PM Primary Care at [facility]

## 2020-11-29 ENCOUNTER — Ambulatory Visit: Payer: No Typology Code available for payment source | Admitting: Surgery

## 2020-12-03 ENCOUNTER — Encounter: Payer: Self-pay | Admitting: *Deleted

## 2020-12-13 ENCOUNTER — Ambulatory Visit: Payer: No Typology Code available for payment source | Admitting: Surgery

## 2020-12-18 ENCOUNTER — Encounter: Payer: Self-pay | Admitting: Surgery

## 2020-12-18 ENCOUNTER — Ambulatory Visit: Payer: Self-pay | Admitting: Surgery

## 2020-12-18 ENCOUNTER — Other Ambulatory Visit: Payer: Self-pay

## 2020-12-18 VITALS — BP 152/84 | HR 74 | Temp 98.3°F | Ht 64.5 in | Wt 143.0 lb

## 2020-12-18 DIAGNOSIS — D171 Benign lipomatous neoplasm of skin and subcutaneous tissue of trunk: Secondary | ICD-10-CM

## 2020-12-18 NOTE — Progress Notes (Signed)
12/18/2020  Reason for Visit:  Back cyst  Referring Provider:  Durene Fruits, NP  History of Present Illness: Blake Santana is a 50 y.o. male presenting for evaluation of a back cyst.  The patient reports that he's had this for a few months.  It is in the lower back, at the midline overlying the spinous process.  The patient feels that it's grown a bit in size.  He also reports that it's sore when he touches it and also tender when he lies back on it.  Denies any drainage or redness of the skin.  His wife has tried to squeeze it thinking it contained fluid, but nothing would come out.  Denies any fevers, chills, chest pain, shortness of breath, nausea, vomiting.   Of note, he reports having another mass like that in the left anterior lower chest.  This one is asymptomatic.  Past Medical History: Past Medical History:  Diagnosis Date  . Acute respiratory failure with hypoxia ([place]) 09/07/2018  . Anemia of chronic kidney failure 03/11/2017  . Cataracts, bilateral 2019  . Chest pain 09/01/2018  . CHF (congestive heart failure) ([place])   . Chronic kidney disease 03/11/2017  . Diabetes mellitus without complication ([place]) 1610  . Edema 01/14/2017  . Headache   . Hypercholesterolemia   . Hypertension 03/11/2017  . Hypertensive retinopathy of both eyes 2019  . Iron deficiency anemia 06/18/2016  . Pneumonia 09/07/2018  . Proliferative diabetic retinopathy with macular edema ([place]) 2019   Retinal hemorrhage, bilateral and left vitreous hemorrhage.  Being treated with Lucentis.  Dr. Iona Hansen  . RSV (respiratory syncytial virus infection) 08/2017     Past Surgical History: Past Surgical History:  Procedure Laterality Date  . A/V FISTULAGRAM Left 01/19/2018   Procedure: A/V FISTULAGRAM - Left AV;  Surgeon: Waynetta Sandy, MD;  Location: [facility];  Service: Cardiovascular;  Laterality: Left;  . AV FISTULA PLACEMENT Left 08/09/2017   Procedure: LEFT RADIOCEPHALIC ARTERIOVENOUS  (AV) FISTULA CREATION;  Surgeon: [name], MD;  Location: [facility] OR;  Service: Vascular;  Laterality: Left;  . AV FISTULA PLACEMENT Left 02/02/2018   Procedure: ARTERIOVENOUS (AV) FISTULA CREATION LEFT UPPER EXTREMITY;  Surgeon: [name], MD;  Location: [facility];  Service: Vascular;  Laterality: Left;  . INSERTION OF DIALYSIS CATHETER  08/09/2017  . INSERTION OF DIALYSIS CATHETER Right 08/09/2017   Procedure: INSERTION OF DIALYSIS CATHETER RIGHT INTERNAL JUGULAR;  Surgeon: [name], MD;  [facility];  Service: Vascular;  Laterality: Right;  . IR DIALY SHUNT INTRO [place] W/IMG LEFT Left 07/10/2020  . IR REMOVAL TUN CV CATH W/O FL  07/01/2018  . LEFT HEART CATH AND CORONARY ANGIOGRAPHY N/A 09/05/2018   Procedure: LEFT HEART CATH AND CORONARY ANGIOGRAPHY;  Surgeon: [name], MD;  Location: [facility];  Service: Cardiovascular;  Laterality: N/A;    Home Medications: Prior to Admission medications   Medication Sig Start Date End Date Taking? Authorizing Provider  acetaminophen (TYLENOL) 500 MG tablet Take 1,000 mg by mouth every 6 (six) hours as needed for moderate pain or headache.   Yes [provider]  AgaMatrix Ultra-Thin Lancets MISC Check blood glucose twice daily before meals. 10/18/19  Yes Mack Hook, MD  amLODipine (NORVASC) 5 MG tablet Take 1 tablet by mouth once daily with breakfast Patient taking differently: Take 5 mg by mouth daily. 10/18/19  Yes Mack Hook, MD  aspirin EC 81 MG EC tablet Take 1 tablet (  81 mg total) by mouth daily. 09/08/18  Yes Mullis, Kiersten P, DO  b complex-vitamin c-folic acid (NEPHRO-VITE) 0.8 MG TABS tablet Take 1 tablet by mouth Every Tuesday,Thursday,and Saturday with dialysis.  08/11/18  Yes [provider]  Blood Glucose Monitoring Suppl (AGAMATRIX PRESTO) w/Device KIT Check sugars twice daily 06/04/16  Yes Mack Hook, MD  glipiZIDE (GLUCOTROL) 5 MG tablet Take 1 tablet (5 mg  total) by mouth 2 (two) times daily with a meal. 10/18/19  Yes Mack Hook, MD  glucose blood (AGAMATRIX PRESTO TEST) test strip Check sugars twice daily 10/18/19  Yes Mack Hook, MD  lanthanum (FOSRENOL) 1000 MG chewable tablet Chew 1,000 mg by mouth 3 (three) times daily with meals.   Yes [provider]  loperamide (IMODIUM A-D) 2 MG tablet Take 2 mg by mouth 4 (four) times daily as needed for diarrhea or loose stools.   Yes [provider]  metoprolol tartrate (LOPRESSOR) 25 MG tablet TAKE 1/2 (ONE-HALF) TABLET BY MOUTH TWICE DAILY WITH MEALS Patient taking differently: Take 12.5 mg by mouth 2 (two) times daily. 10/18/19  Yes Mack Hook, MD  rosuvastatin (CRESTOR) 20 MG tablet Take 1 tablet (20 mg total) by mouth daily at 6 PM. 10/18/19  Yes Mack Hook, MD  sucroferric oxyhydroxide (VELPHORO) 500 MG chewable tablet Chew 2 tablets (1,000 mg total) by mouth 3 (three) times daily with meals. 10/18/19  Yes Mack Hook, MD    Allergies: No Known Allergies  Social History:  reports that he has quit smoking. His smoking use included cigarettes. He has never used smokeless tobacco. He reports previous alcohol use. He reports that he does not use drugs.   Family History: Family History  Problem Relation Age of Onset  . Diabetes Mother   . Kidney disease Mother        renal failure--diabetes, cause of death  . Hypertension Mother   . Diabetes Father   . Kidney disease Father        Cause of death:  kidney failure form diabetes  . Alcohol abuse Father   . Diabetes Sister     Review of Systems: Review of Systems  Constitutional: Negative for chills and fever.  HENT: Negative for hearing loss.   Respiratory: Negative for shortness of breath.   Cardiovascular: Negative for chest pain.  Gastrointestinal: Negative for abdominal pain, nausea and vomiting.  Genitourinary: Negative for dysuria.  Musculoskeletal: Negative for myalgias.   Skin:       Mass on his lower mid back and left lower chest.  Neurological: Negative for dizziness.  Psychiatric/Behavioral: Negative for depression.    Physical Exam BP (!) 152/84   Pulse 74   Temp 98.3 F (36.8 C)   Ht 5' 4.5" (1.638 m)   Wt 143 lb (64.9 kg)   SpO2 95%   BMI 24.17 kg/m  CONSTITUTIONAL: No acute distress HEENT:  Normocephalic, atraumatic, extraocular motion intact. NECK: Trachea is midline, and there is no jugular venous distension.  RESPIRATORY:  Lungs are clear, and breath sounds are equal bilaterally. Normal respiratory effort without pathologic use of accessory muscles. CARDIOVASCULAR: Heart is regular without murmurs, gallops, or rubs. GI: The abdomen is soft, non-distended, non-tender.  MUSCULOSKELETAL:  Normal muscle strength and tone in all four extremities.  No peripheral edema or cyanosis. SKIN: Patient has a 1.5 cm mass in the lower back at midline, which is soft, mobile, and with discomfort to palpation.  No skin pore or exit pore.  He also has the  same in the left anterior chest wall, though without discomfort to palpation or motion.  NEUROLOGIC:  Motor and sensation is grossly normal.  Cranial nerves are grossly intact. PSYCH:  Alert and oriented to person, place and time. Affect is normal.  Laboratory Analysis: No results found for this or any previous visit (from the past 24 hour(s)).  Imaging: No results found.  Assessment and Plan: This is a 50 y.o. male with back and anterior chest wall mass.  Discussed with the patient that these are actually lipomas, not cysts.  They are mobile, soft, without any skin opening/pore, and deeper compared to where a cyst would be.  Discussed with him that the lipoma is in the subcutaneous space, but it is not affecting the spine/bones, or other tissues.  We can offer him excision of the back mass in the office.  He prefers only the back mass as the other one is asymptomatic.  Discussed with him the risks of  bleeding, infection, and injury to surrounding structures.  He's willing to proceed.  We'll schedule him for an office appointment next week for excision.  Face-to-face time spent with the patient and care providers was 60 minutes, with more than 50% of the time spent counseling, educating, and coordinating care of the patient.     Melvyn Neth, [facility]

## 2020-12-18 NOTE — Patient Instructions (Signed)
Escisin de lesiones en la piel Excision of Skin Lesions La escisin de una lesin en la piel consiste en extirpar una parte de piel mediante pequeos cortes (incisiones) en la piel. A travs de [place], se retira la lesin por completo. Este procedimiento se realiza con frecuencia para tratar o prevenir un cncer o una infeccin. Tambin puede hacerse para mejorar el aspecto esttico. El procedimiento se puede realizar para extirpar lo siguiente:  Tumores cancerosos (malignos), como el carcinoma basocelular, el carcinoma espinocelular o el melanoma.  Tumores no cancerosos (benignos), como un quiste o un lipoma.  Crecimientos celulares, tales como los lunares o los papilomas cutneos, que pueden ser extirpados por motivos estticos. Pueden utilizarse diversas tcnicas de escisin o ciruga dependiendo de su afeccin, del lugar de la lesin y de su salud general. Informe al mdico acerca de lo siguiente:  Cualquier alergia que tenga.  Todos los [employer], incluidos vitaminas, hierbas, gotas oftlmicas, cremas y medicamentos de venta libre.  Cualquier problema previo que usted o algn miembro de su familia haya tenido con los anestsicos.  Cualquier trastorno de la sangre que tenga.  Cirugas a las que se haya sometido.  Cualquier afeccin que tenga o haya tenido.  Si est embarazada o podra estarlo. Cules son los riesgos? En general, se trata de un procedimiento seguro. Sin embargo, pueden ocurrir complicaciones, por ejemplo:  Sangrado.  Infeccin.  Formacin de cicatrices.  Recurrencia del quiste, lipoma o cncer.  Cambios en el aspecto o la sensibilidad de la piel, tales como cambio de color o hinchazn.  Reaccin a la anestesia.  Reaccin alrgica a los ungentos o materiales quirrgicos.  [employer] nervios, los vasos sanguneos, los msculos u otras estructuras.  Dolor continuo. Qu ocurre antes del procedimiento? Medicamentos Consulte al  mdico sobre:  Quarry manager o suspender los medicamentos que toma habitualmente. Esto es muy importante si toma medicamentos para la diabetes o anticoagulantes.  Tomar medicamentos como aspirina e ibuprofeno. Estos medicamentos pueden tener un efecto anticoagulante en la [place]. No tome estos medicamentos a menos que el mdico se lo indique.  Tomar medicamentos de [employer], vitaminas, hierbas y suplementos. Indicaciones generales  Posiblemente le indiquen que deje de fumar.  Pueden hacerle un examen o anlisis.  Pregntele al mdico qu medidas se tomarn para ayudar a prevenir una infeccin. Estas pueden incluir: ? Rasurar el vello del lugar de la ciruga. ? Lavar la piel con un jabn antisptico. ? Suministrar antibiticos. Qu ocurre durante el procedimiento?  Le administrarn un medicamento para adormecer la zona (anestesia local).  El mdico extirpar las lesiones mediante una de las siguientes tcnicas de escisin. ? Escisin [employer]. Este procedimiento puede hacerse para tratar un tumor canceroso o una lesin o un quiste no canceroso.  Se utiliza un bistur pequeo o tijeras para cortar con cuidado la zona que rodea y que est debajo de la lesin hasta extraerla por completo.  Si se produce sangrado, se detiene con un dispositivo que aplica calor (electrocauterizacin).  Los bordes de la herida se pueden unir cosindolos con puntos (suturas).  Le colocarn una venda (vendaje).  Se enviarn muestras a un laboratorio para su anlisis. ? Escisin de un quiste.  Le harn una incisin en el quiste.  El quiste se extraer entero a travs de la incisin.  La incisin puede cerrarse con suturas. ? Escisin por rasurado. Esto se puede hacer para extirpar un lunar o un papiloma cutneo.  Se utiliza una navaja pequea o un  instrumento tipo asa que se calienta elctricamente para cortar la lesin.  En general, se deja que la herida cicatrice sola, sin  suturas. ? Escisin en sacabocados. Esto puede hacerse para extraer un lunar o una cicatriz o para hacer una biopsia de la lesin.  Se utiliza un pequeo instrumento similar a un cortador de galletas o un sacabocados para cortar un crculo de piel.  Los bordes externos de la piel se unen con suturas.  Es posible que la [place] se enve a un laboratorio para su anlisis. ? Ciruga microgrfica de [place]. Generalmente se realiza para Lawyer de piel. Este tipo de escisin se Forensic scientist en la cara y las [place]. Este procedimiento es mnimamente invasivo y garantiza el mejor resultado esttico.  Se utiliza un bistur o un instrumento tipo asa para extraer capas de la lesin hasta que se haya extirpado todo el tejido anormal o canceroso.  La herida se puede suturar dependiendo de su tamao.  El tejido se analiza con un microscopio de inmediato. Cada una de las tcnicas puede variar segn el mdico y el hospital. Al final de cualquiera de estos procedimientos, se aplica un ungento con antibitico, segn sea necesario.   Qu ocurre despus del procedimiento?  Retome sus actividades normales como se lo haya indicado el mdico. Pregntele al mdico qu actividades son seguras para usted.  Es su responsabilidad retirar [employer] del procedimiento. Pregntele al mdico o consulte en el departamento que Multimedia programmer procedimiento cundo [employer].  Hable con el mdico acerca de los [employer], las opciones de tratamiento y la posible necesidad de Optometrist ms [employer].  Concurra a todas las visitas de seguimiento como se lo haya indicado el mdico. Esto es importante. Resumen  La escisin de una lesin en la piel consiste en extirpar una parte de piel mediante pequeos cortes (incisiones) en la piel. Este procedimiento se realiza con frecuencia para tratar o prevenir un cncer o una infeccin, o se puede realizar para Programmer, multimedia.  Pueden utilizarse diversas tcnicas de escisin o ciruga dependiendo de su afeccin, del lugar de la lesin y de su salud general.  Despus del procedimiento, hable con el mdico acerca de los [place] de los [place], las opciones de tratamiento y la posible necesidad de Optometrist ms [employer].  Concurra a todas las visitas de seguimiento como se lo haya indicado el mdico. Esto es importante. Esta informacin no tiene Marine scientist el consejo del mdico. Asegrese de hacerle al mdico cualquier pregunta que tenga. Document Revised: 03/03/2018 Document Reviewed: 03/03/2018 Elsevier Patient Education  2021 [employer].

## 2020-12-19 ENCOUNTER — Encounter: Payer: Self-pay | Admitting: Surgery

## 2020-12-27 ENCOUNTER — Ambulatory Visit: Payer: Self-pay | Admitting: Surgery

## 2021-01-14 NOTE — Progress Notes (Signed)
Patient ID: Blake Santana, male    DOB: 1971/06/30  MRN: [id number]  CC: Annual Physical Exam  Subjective: Blake Santana is a 50 y.o. male who presents for annual physical exam.   His concerns today include:   HYPERTENSION FOLLOW-UP: 11/20/2020: - Continue Amlodipine and Metoprolol as prescribed. - Follow-up in 1 to 2 weeks for blood pressure check.  01/15/2021: Currently taking: see medication list Have you taken your blood pressure medication today: '[]'  Yes '[x]'  No  Med Adherence: '[x]'  Yes    '[]'  No Medication side effects: '[]'  Yes    '[x]'  No Adherence with salt restriction (low-salt diet): '[x]'  Yes    '[]'  No Home Monitoring?: '[]'  Yes    '[x]'  No, reports blood pressure taken at every appointment for dialysis [facility]) and told it is normal  SOB? '[]'  Yes    '[x]'  No Chest Pain?: '[]'  Yes    '[x]'  No  2. DIABETES TYPE 2 FOLLOW-UP: Visit 10/18/2019 at [facility] per MD note: Has had good control in recent years. Continue Glipizide  01/15/2021: Reports on last week [facility] told him to increase Glipizide from 10 mg once daily to 10 mg twice daily. Doing well on this regimen. He is monitoring the food he eats. Reports his diabetic eye exam is up-to-date.   3. DYSLIPIDEMIA FOLLOW-UP: Visit 10/18/2019 at [facility] per MD note: Crestor. Follow up for fasting labs in 1 week:  FLP  01/15/2021: Doing well on Crestor.   Patient Active Problem List   Diagnosis Date Noted   Pneumonia due to COVID-19 virus 03/30/2020   Acute respiratory failure due to COVID-19 ([place]) 03/30/2020   Sepsis ([place]) 03/30/2020   Noncompliance 02/24/2020   TB (tuberculosis) contact 11/27/2018   COVID-19 virus detected    Hypoxia    Abnormal EKG    Volume overload 09/02/2018   Pleural effusion, bilateral 09/02/2018   Atypical chest pain 09/01/2018   Acute respiratory failure with hypoxia ([place]) 09/01/2018   Community acquired pneumonia     Elevated troponin    Malnutrition of moderate degree 08/11/2017   RSV (respiratory syncytial virus infection) 08/09/2017   ESRD (end stage renal disease) on dialysis ([place]) 08/08/2017   Proliferative diabetic retinopathy with macular edema ([place]) 08/03/2017   Hypertensive retinopathy of both eyes 08/03/2017   Cataracts, bilateral 08/03/2017   Acute exacerbation of CHF (congestive heart failure) ([place]) 04/03/2017   Anemia of chronic kidney failure 03/11/2017   Hypertension 03/11/2017   Chronic kidney disease 03/11/2017   Edema 01/14/2017   Dyslipidemia 10/15/2016   Diabetic retinopathy ([place]) 10/15/2016   Microalbuminuria 06/18/2016   Normocytic anemia 06/18/2016   Iron deficiency anemia 06/18/2016   Diabetes mellitus with ESRD (end-stage renal disease) ([place]) 10/20/2001     Current Outpatient Medications on File Prior to Visit  Medication Sig Dispense Refill   acetaminophen (TYLENOL) 500 MG tablet Take 1,000 mg by mouth every 6 (six) hours as needed for moderate pain or headache.     AgaMatrix Ultra-Thin Lancets MISC Check blood glucose twice daily before meals. 100 each 11   amLODipine (NORVASC) 5 MG tablet Take 1 tablet by mouth once daily with breakfast (Patient taking differently: Take 5 mg by mouth daily.) 90 tablet 3   aspirin EC 81 MG EC tablet Take 1 tablet (81 mg total) by mouth daily. 30 tablet 0   b complex-vitamin c-folic acid (NEPHRO-VITE) 0.8 MG TABS tablet Take 1 tablet by mouth Every Tuesday,Thursday,and  Saturday with dialysis.      Blood Glucose Monitoring Suppl (AGAMATRIX PRESTO) w/Device KIT Check sugars twice daily 1 kit 0   glipiZIDE (GLUCOTROL) 5 MG tablet Take 1 tablet (5 mg total) by mouth 2 (two) times daily with a meal. 180 tablet 3   glucose blood (AGAMATRIX PRESTO TEST) test strip Check sugars twice daily 100 each 12   lanthanum (FOSRENOL) 1000 MG chewable tablet Chew 1,000 mg by mouth 3 (three) times daily with meals.     loperamide (IMODIUM A-D) 2 MG tablet  Take 2 mg by mouth 4 (four) times daily as needed for diarrhea or loose stools.     metoprolol tartrate (LOPRESSOR) 25 MG tablet TAKE 1/2 (ONE-HALF) TABLET BY MOUTH TWICE DAILY WITH MEALS (Patient taking differently: Take 12.5 mg by mouth 2 (two) times daily.) 90 tablet 3   rosuvastatin (CRESTOR) 20 MG tablet Take 1 tablet (20 mg total) by mouth daily at 6 PM. 90 tablet 3   sucroferric oxyhydroxide (VELPHORO) 500 MG chewable tablet Chew 2 tablets (1,000 mg total) by mouth 3 (three) times daily with meals. 540 tablet 3   No current facility-administered medications on file prior to visit.    No Known Allergies  Social History   Socioeconomic History   Marital status: Married    Spouse name: [name]   Number of children: 1   Years of education: 9   Highest education level: Not on file  Occupational History   Occupation: Painter  Tobacco Use   Smoking status: Former    Pack years: 0.00    Types: Cigarettes   Smokeless tobacco: Never  Vaping Use   Vaping Use: Never used  Substance and Sexual Activity   Alcohol use: Not Currently   Drug use: No   Sexual activity: Not on file  Other Topics Concern   Not on file  Social History Narrative   Originally from [place]   Came to [place] [employer]. In [place] with his girlfriend and their son.   Social Determinants of Health   Financial Resource Strain: Not on file  Food Insecurity: Not on file  Transportation Needs: Not on file  Physical Activity: Not on file  Stress: Not on file  Social Connections: Not on file  Intimate Partner Violence: Not on file    Family History  Problem Relation Age of Onset   Diabetes Mother    Kidney disease Mother        renal failure--diabetes, cause of death   Hypertension Mother    Diabetes Father    Kidney disease Father        Cause of death:  kidney failure form diabetes   Alcohol abuse Father    Diabetes Sister     Past Surgical History:  Procedure Laterality Date   A/V FISTULAGRAM  Left 01/19/2018   Procedure: A/V FISTULAGRAM - Left AV;  Surgeon: Waynetta Sandy, MD;  Location: [place] [facility];  Service: Cardiovascular;  Laterality: Left;   AV FISTULA PLACEMENT Left 08/09/2017   Procedure: LEFT RADIOCEPHALIC ARTERIOVENOUS (AV) FISTULA CREATION;  Surgeon: [name], MD;  [facility];  Service: Vascular;  Laterality: Left;   AV FISTULA PLACEMENT Left 02/02/2018   Procedure: ARTERIOVENOUS (AV) FISTULA CREATION LEFT UPPER EXTREMITY;  Surgeon: [name], MD;  Location: [facility];  Service: Vascular;  Laterality: Left;   INSERTION OF DIALYSIS CATHETER  08/09/2017   INSERTION OF DIALYSIS CATHETER Right 08/09/2017   Procedure: INSERTION OF DIALYSIS  CATHETER RIGHT INTERNAL JUGULAR;  Surgeon: [name], MD;  Location: [facility] OR;  Service: Vascular;  Laterality: Right;   IR DIALY SHUNT INTRO NEEDLE/INTRACATH INITIAL W/IMG LEFT Left 07/10/2020   IR REMOVAL TUN CV CATH W/O FL  07/01/2018   LEFT HEART CATH AND CORONARY ANGIOGRAPHY N/A 09/05/2018   Procedure: LEFT HEART CATH AND CORONARY ANGIOGRAPHY;  Surgeon: [name], MD;  Location: [place] [facility];  Service: Cardiovascular;  Laterality: N/A;    ROS: Review of Systems Negative except as stated above  PHYSICAL EXAM: BP (!) 150/98 (BP Location: Left Arm, Patient Position: Sitting, Cuff Size: Normal)   Pulse 71   Temp 98.1 F (36.7 C)   Resp 15   Ht 5' 4.49" (1.638 m)   Wt 140 lb 12.8 oz (63.9 kg)   SpO2 96%   BMI 23.80 kg/m   Physical Exam General appearance - alert, well appearing, and in no distress and oriented to person, place, and time Mental status - alert, oriented to person, place, and time, normal mood, behavior, speech, dress, motor activity, and thought processes Eyes - pupils equal and reactive, extraocular eye movements intact Ears - bilateral TM's and external ear canals normal Neck - supple, no significant adenopathy Lymphatics - no palpable lymphadenopathy, no  hepatosplenomegaly Chest - clear to auscultation, no wheezes, rales or rhonchi, symmetric air entry, no tachypnea, retractions or cyanosis Heart - normal rate, regular rhythm, normal S1, S2, no murmurs, rubs, clicks or gallops Abdomen - soft, nontender, nondistended, no masses or organomegaly GU Male - patient declined exam Neurological - alert, oriented, normal speech, no focal findings or movement disorder noted, neck supple without rigidity, cranial nerves II through XII intact, motor and sensory grossly normal bilaterally, normal muscle tone, no tremors, strength 5/5 Musculoskeletal - no joint tenderness, deformity or swelling, no muscular tenderness noted, left upper arm fistula thrill and bruit normal Extremities - peripheral pulses normal, no pedal edema, no clubbing or cyanosis Skin - normal coloration and turgor, no rashes, no suspicious skin lesions noted  ASSESSMENT AND PLAN: 1. Annual physical exam: - Counseled on 150 minutes of exercise per week as tolerated, healthy eating (including decreased daily intake of saturated fats, cholesterol, added sugars, sodium), STI prevention, and routine healthcare maintenance.  2. Screening for metabolic disorder: - CMP to check kidney function, liver function, and electrolyte balance.  - Comprehensive metabolic panel  3. Thyroid disorder screen: - TSH to check thyroid function.  - TSH  4. Essential hypertension: - Blood pressure not at goal during today's visit. Patient asymptomatic without chest pressure, chest pain, palpitations, shortness of breath, and worst headache of life. - Patient reports blood pressures are always normal at three times weekly dialysis appointments at the [place] office.  - Continue Amlodipine and Metoprolol as prescribed.  - Counseled on blood pressure goal of less than 130/80, low-sodium, DASH diet, medication compliance, 150 minutes of moderate intensity exercise per week as tolerated. Discussed  medication compliance, adverse effects. - Follow-up with primary provider in 3 months or sooner if needed.   5. Diabetes mellitus with ESRD (end-stage renal disease) ([place]): - Hemoglobin A1c at goal at 6.7%, goal < 7%. This is improved from hemoglobin A1c of 7.4% on 03/31/2020. Next hemoglobin A1c due September 2022.  - Continue Glipizide as prescribed.  - Discussed the importance of healthy eating habits, low-carbohydrate diet, low-sugar diet, regular aerobic exercise (at least 150 minutes a week as tolerated) and medication compliance to achieve or maintain  control of diabetes. - Follow-up with primary provider in 3 months or sooner if needed.  - POCT glycosylated hemoglobin (Hb A1C) - glipiZIDE (GLUCOTROL) 10 MG tablet; Take 1 tablet (10 mg total) by mouth 2 (two) times daily before a meal.  Dispense: 60 tablet; Refill: 2  6. Diabetic eye exam [facility]): - Patient reports he is up-to-date.   7. Anemia of chronic renal failure, stage 4 (severe) ([place]): - CBC to screen for blood count.  - CBC  8. Dyslipidemia: -Practice low-fat heart healthy diet and at least 150 minutes of moderate intensity exercise weekly as tolerated.  - Continue Rosuvastatin as prescribed.  - Lipid panel to screen for high cholesterol.  - Lipid panel - rosuvastatin (CRESTOR) 20 MG tablet; Take 1 tablet (20 mg total) by mouth daily at 6 PM.  Dispense: 90 tablet; Refill: 0  9. Colon cancer screening: - Fecal occult blood to screen for possible blood in stool which may be related to colon cancer.  - Fecal occult blood, imunochemical([facility])  10. Language barrier: - [employer] participated during today's visit. Interpreter Name: Delorise Shiner, ID#: [id number].   Patient was given the opportunity to ask questions.  Patient verbalized understanding of the plan and was able to repeat key elements of the plan. Patient was given clear instructions to go to Emergency Department or return to medical center if symptoms  don't improve, worsen, or new problems develop.The patient verbalized understanding.   Orders Placed This Encounter  Procedures   Fecal occult blood, imunochemical([facility]/Sunquest)   CBC   Comprehensive metabolic panel   Lipid panel   TSH   Hemoglobin A1c   POCT glycosylated hemoglobin (Hb A1C)     Requested Prescriptions    No prescriptions requested or ordered in this encounter    Return in about 3 months (around 04/17/2021) for Follow-Up hypertension, diabetes, cholesterol .  [name], NP

## 2021-01-15 ENCOUNTER — Encounter: Payer: Self-pay | Admitting: Family

## 2021-01-15 ENCOUNTER — Other Ambulatory Visit: Payer: Self-pay

## 2021-01-15 ENCOUNTER — Ambulatory Visit (INDEPENDENT_AMBULATORY_CARE_PROVIDER_SITE_OTHER): Payer: Self-pay | Admitting: Family

## 2021-01-15 VITALS — BP 150/98 | HR 71 | Temp 98.1°F | Resp 15 | Ht 64.49 in | Wt 140.8 lb

## 2021-01-15 DIAGNOSIS — N184 Chronic kidney disease, stage 4 (severe): Secondary | ICD-10-CM

## 2021-01-15 DIAGNOSIS — Z0100 Encounter for examination of eyes and vision without abnormal findings: Secondary | ICD-10-CM

## 2021-01-15 DIAGNOSIS — N186 End stage renal disease: Secondary | ICD-10-CM

## 2021-01-15 DIAGNOSIS — Z1329 Encounter for screening for other suspected endocrine disorder: Secondary | ICD-10-CM

## 2021-01-15 DIAGNOSIS — E785 Hyperlipidemia, unspecified: Secondary | ICD-10-CM

## 2021-01-15 DIAGNOSIS — Z1211 Encounter for screening for malignant neoplasm of colon: Secondary | ICD-10-CM

## 2021-01-15 DIAGNOSIS — I10 Essential (primary) hypertension: Secondary | ICD-10-CM

## 2021-01-15 DIAGNOSIS — Z0000 Encounter for general adult medical examination without abnormal findings: Secondary | ICD-10-CM

## 2021-01-15 DIAGNOSIS — D631 Anemia in chronic kidney disease: Secondary | ICD-10-CM

## 2021-01-15 DIAGNOSIS — E1122 Type 2 diabetes mellitus with diabetic chronic kidney disease: Secondary | ICD-10-CM

## 2021-01-15 DIAGNOSIS — Z789 Other specified health status: Secondary | ICD-10-CM

## 2021-01-15 DIAGNOSIS — E119 Type 2 diabetes mellitus without complications: Secondary | ICD-10-CM

## 2021-01-15 DIAGNOSIS — Z13228 Encounter for screening for other metabolic disorders: Secondary | ICD-10-CM

## 2021-01-15 LAB — POCT GLYCOSYLATED HEMOGLOBIN (HGB A1C): Hemoglobin A1C: 6.7 % — AB (ref 4.0–5.6)

## 2021-01-15 NOTE — Progress Notes (Signed)
Pt presents for annual physical exam, pt has not taken BP meds

## 2021-01-16 LAB — COMPREHENSIVE METABOLIC PANEL
ALT: 18 IU/L (ref 0–44)
AST: 11 IU/L (ref 0–40)
Albumin/Globulin Ratio: 1.4 (ref 1.2–2.2)
Albumin: 4.4 g/dL (ref 3.8–4.9)
Alkaline Phosphatase: 114 IU/L (ref 44–121)
BUN/Creatinine Ratio: 6 — ABNORMAL LOW (ref 9–20)
BUN: 44 mg/dL — ABNORMAL HIGH (ref 6–24)
Bilirubin Total: 0.3 mg/dL (ref 0.0–1.2)
CO2: 26 mmol/L (ref 20–29)
Calcium: 10.2 mg/dL (ref 8.7–10.2)
Chloride: 94 mmol/L — ABNORMAL LOW (ref 96–106)
Creatinine, Ser: 7.25 mg/dL — ABNORMAL HIGH (ref 0.76–1.27)
Globulin, Total: 3.2 g/dL (ref 1.5–4.5)
Glucose: 114 mg/dL — ABNORMAL HIGH (ref 65–99)
Potassium: 5.3 mmol/L — ABNORMAL HIGH (ref 3.5–5.2)
Sodium: 142 mmol/L (ref 134–144)
Total Protein: 7.6 g/dL (ref 6.0–8.5)
eGFR: 8 mL/min/{1.73_m2} — ABNORMAL LOW (ref >59)

## 2021-01-16 LAB — CBC
Hematocrit: 36 % — ABNORMAL LOW (ref 37.5–51.0)
Hemoglobin: 12 g/dL — ABNORMAL LOW (ref 13.0–17.7)
MCH: 32 pg (ref 26.6–33.0)
MCHC: 33.3 g/dL (ref 31.5–35.7)
MCV: 96 fL (ref 79–97)
Platelets: 201 10*3/uL (ref 150–450)
RBC: 3.75 x10E6/uL — ABNORMAL LOW (ref 4.14–5.80)
RDW: 14.2 % (ref 11.6–15.4)
WBC: 9.2 10*3/uL (ref 3.4–10.8)

## 2021-01-16 LAB — LIPID PANEL
Chol/HDL Ratio: 4.4 ratio (ref 0.0–5.0)
Cholesterol, Total: 180 mg/dL (ref 100–199)
HDL: 41 mg/dL (ref >39)
LDL Chol Calc (NIH): 117 mg/dL — ABNORMAL HIGH (ref 0–99)
Triglycerides: 124 mg/dL (ref 0–149)
VLDL Cholesterol Cal: 22 mg/dL (ref 5–40)

## 2021-01-16 LAB — HEMOGLOBIN A1C
Est. average glucose Bld gHb Est-mCnc: 146 mg/dL
Hgb A1c MFr Bld: 6.7 % — ABNORMAL HIGH (ref 4.8–5.6)

## 2021-01-16 LAB — TSH: TSH: 1.49 u[IU]/mL (ref 0.450–4.500)

## 2021-01-16 MED ORDER — ROSUVASTATIN CALCIUM 20 MG PO TABS: 20.0000 mg | ORAL_TABLET | Freq: Every day | ORAL | 0 refills | Status: DC

## 2021-01-16 MED ORDER — GLIPIZIDE 10 MG PO TABS: 10.0000 mg | ORAL_TABLET | Freq: Two times a day (BID) | ORAL | 2 refills | Status: DC

## 2021-01-16 NOTE — Progress Notes (Signed)
Diabetes discussed in office.   Thyroid function normal.   Anemia related to chronic kidney failure but improved since 9 months ago. Keep all appointments with [facility] for management of end-stage renal disease.   Cholesterol higher than expected. High cholesterol may increase risk of heart attack and/or stroke. Consider eating more fruits, vegetables, and lean baked meats such as chicken or fish. Moderate intensity exercise at least 150 minutes as tolerated per week may help as well.   Continue Rosuvastatin for high cholesterol. Encouraged to recheck in 3 to 6 months.   The following is for provider reference only: The 10-year ASCVD risk score is: 12.9%   Values used to calculate the score:     Age: 50 years     Sex: Male     Is Non-Hispanic African American: No     Diabetic: Yes     Tobacco smoker: No     Systolic Blood Pressure: 867 mmHg     Is BP treated: Yes     HDL Cholesterol: 41 mg/dL     Total Cholesterol: 180 mg/dL

## 2021-01-20 ENCOUNTER — Ambulatory Visit: Payer: Self-pay | Admitting: Podiatry

## 2021-03-12 ENCOUNTER — Ambulatory Visit: Payer: Self-pay | Admitting: Podiatry

## 2021-04-09 ENCOUNTER — Other Ambulatory Visit (HOSPITAL_COMMUNITY): Payer: Self-pay | Admitting: Nephrology

## 2021-04-09 DIAGNOSIS — N186 End stage renal disease: Secondary | ICD-10-CM

## 2021-04-11 ENCOUNTER — Other Ambulatory Visit (HOSPITAL_COMMUNITY): Payer: Self-pay | Admitting: Physician Assistant

## 2021-04-14 ENCOUNTER — Other Ambulatory Visit: Payer: Self-pay

## 2021-04-14 ENCOUNTER — Ambulatory Visit (HOSPITAL_COMMUNITY)
Admission: RE | Admit: 2021-04-14 | Discharge: 2021-04-14 | Disposition: A | Discharge status: Home / Self Care | Payer: Self-pay | Source: Ambulatory Visit | Attending: Nephrology | Admitting: Nephrology

## 2021-04-14 DIAGNOSIS — E1122 Type 2 diabetes mellitus with diabetic chronic kidney disease: Secondary | ICD-10-CM | POA: Insufficient documentation

## 2021-04-14 DIAGNOSIS — I509 Heart failure, unspecified: Secondary | ICD-10-CM | POA: Insufficient documentation

## 2021-04-14 DIAGNOSIS — Z992 Dependence on renal dialysis: Secondary | ICD-10-CM | POA: Insufficient documentation

## 2021-04-14 DIAGNOSIS — Z833 Family history of diabetes mellitus: Secondary | ICD-10-CM | POA: Insufficient documentation

## 2021-04-14 DIAGNOSIS — T82510A Breakdown (mechanical) of surgically created arteriovenous fistula, initial encounter: Secondary | ICD-10-CM | POA: Insufficient documentation

## 2021-04-14 DIAGNOSIS — N186 End stage renal disease: Secondary | ICD-10-CM | POA: Insufficient documentation

## 2021-04-14 DIAGNOSIS — Z7984 Long term (current) use of oral hypoglycemic drugs: Secondary | ICD-10-CM | POA: Insufficient documentation

## 2021-04-14 DIAGNOSIS — I132 Hypertensive heart and chronic kidney disease with heart failure and with stage 5 chronic kidney disease, or end stage renal disease: Secondary | ICD-10-CM | POA: Insufficient documentation

## 2021-04-14 DIAGNOSIS — Y841 Kidney dialysis as the cause of abnormal reaction of the patient, or of later complication, without mention of misadventure at the time of the procedure: Secondary | ICD-10-CM | POA: Insufficient documentation

## 2021-04-14 DIAGNOSIS — Z7982 Long term (current) use of aspirin: Secondary | ICD-10-CM | POA: Insufficient documentation

## 2021-04-14 DIAGNOSIS — Z87891 Personal history of nicotine dependence: Secondary | ICD-10-CM | POA: Insufficient documentation

## 2021-04-14 DIAGNOSIS — Z8249 Family history of ischemic heart disease and other diseases of the circulatory system: Secondary | ICD-10-CM | POA: Insufficient documentation

## 2021-04-14 DIAGNOSIS — Z79899 Other long term (current) drug therapy: Secondary | ICD-10-CM | POA: Insufficient documentation

## 2021-04-14 HISTORY — PX: IR DIALY SHUNT INTRO NEEDLE/INTRACATH INITIAL W/IMG LEFT: IMG6102

## 2021-04-14 LAB — GLUCOSE, CAPILLARY: Glucose-Capillary: 99 mg/dL (ref 70–99)

## 2021-04-14 MED ORDER — IOHEXOL 350 MG/ML SOLN
100.0000 mL | Freq: Once | INTRAVENOUS | Status: AC | PRN
  Administered 2021-04-14: 50 mL via INTRAVENOUS

## 2021-04-14 MED ORDER — SODIUM CHLORIDE 0.9 % IV SOLN: INTRAVENOUS | Status: DC

## 2021-04-14 NOTE — Sedation Documentation (Addendum)
No intervention needed per Dr. Kathlene Cote. No sedation given. Patient will discharge from the nurses station with interpreter at bedside.

## 2021-04-14 NOTE — H&P (Signed)
Chief Complaint: Patient was seen in consultation today for fistulogram; malfunctioning fistula  Referring Physician(s): [place]  Supervising Physician: Aletta Edouard  Patient Status: [facility] - Out-pt  History of Present Illness: Blake Santana is a 50 y.o. male with a medical history significant for CHF, DM, HTN, and ESRD on dialysis. He has been having flow issues with his left upper arm AVF and the nephrology team has asked for IR to evaluate this patient for an image-guided fistulogram with possible intervention, possible placement of tunneled dialysis catheter.   Past Medical History:  Diagnosis Date   Acute respiratory failure with hypoxia ([place]) 09/07/2018   Anemia of chronic kidney failure 03/11/2017   Cataracts, bilateral 2019   Chest pain 09/01/2018   CHF (congestive heart failure) ([place])    Chronic kidney disease 03/11/2017   Diabetes mellitus without complication ([place]) 0092   Edema 01/14/2017   Headache    Hypercholesterolemia    Hypertension 03/11/2017   Hypertensive retinopathy of both eyes 2019   Iron deficiency anemia 06/18/2016   Pneumonia 09/07/2018   Proliferative diabetic retinopathy with macular edema ([place]) 2019   Retinal hemorrhage, bilateral and left vitreous hemorrhage.  Being treated with [facility].  Dr. Iona Hansen   RSV (respiratory syncytial virus infection) 08/2017    Past Surgical History:  Procedure Laterality Date   A/V FISTULAGRAM Left 01/19/2018   Procedure: A/V FISTULAGRAM - Left AV;  Surgeon: Waynetta Sandy, MD;  Location: [place] [facility];  Service: Cardiovascular;  Laterality: Left;   AV FISTULA PLACEMENT Left 08/09/2017   Procedure: LEFT RADIOCEPHALIC ARTERIOVENOUS (AV) FISTULA CREATION;  Surgeon: [name], MD;  Location[facility];  Service: Vascular;  Laterality: Left;   AV FISTULA PLACEMENT Left 02/02/2018   Procedure: ARTERIOVENOUS (AV) FISTULA CREATION LEFT UPPER EXTREMITY;  Surgeon: [name], MD;  Location: [place];  Service: Vascular;  Laterality: Left;   INSERTION OF DIALYSIS CATHETER  08/09/2017   INSERTION OF DIALYSIS CATHETER Right 08/09/2017   Procedure: INSERTION OF DIALYSIS CATHETER RIGHT INTERNAL JUGULAR;  Surgeon: [name], MD;  Location[facility];  Service: Vascular;  Laterality: Right;   IR DIALY SHUNT INTRO NEEDLE/INTRACATH INITIAL W/IMG LEFT Left 07/10/2020   IR REMOVAL TUN CV CATH W/O FL  07/01/2018   LEFT HEART CATH AND CORONARY ANGIOGRAPHY N/A 09/05/2018   Procedure: LEFT HEART CATH AND CORONARY ANGIOGRAPHY;  Surgeon: [name], MD;  Location: [facility] [facility];  Service: Cardiovascular;  Laterality: N/A;    Allergies: Patient has no known allergies.  Medications: Prior to Admission medications   Medication Sig Start Date End Date Taking? Authorizing Provider  acetaminophen (TYLENOL) 500 MG tablet Take 1,000 mg by mouth every 6 (six) hours as needed for moderate pain or headache.   Yes [provider]  amLODipine (NORVASC) 5 MG tablet Take 1 tablet by mouth once daily with breakfast Patient taking differently: Take 5 mg by mouth daily. 10/18/19  Yes Mack Hook, MD  b complex-vitamin c-folic acid (NEPHRO-VITE) 0.8 MG TABS tablet Take 1 tablet by mouth Every Tuesday,Thursday,and Saturday with dialysis.  08/11/18  Yes [provider]  cinacalcet (SENSIPAR) 60 MG tablet Take 60 mg by mouth daily.   Yes [provider]  glipiZIDE (GLUCOTROL) 10 MG tablet Take 1 tablet (10 mg total) by mouth 2 (two) times daily before a meal. 01/16/21 04/16/21 Yes Minette Brine, Amy J, NP  guaiFENesin (MUCINEX) 600 MG 12 hr tablet Take 600 mg by mouth 2 (two) times  daily as needed for cough.   Yes [provider]  lanthanum (FOSRENOL) 1000 MG chewable tablet Chew 2,000 mg by mouth 3 (three) times daily with meals.   Yes [provider]  lidocaine-prilocaine (EMLA) cream Apply 1 application topically 3 (three) times a week. Use at dialysis 02/17/21  Yes  [provider]  loperamide (IMODIUM A-D) 2 MG tablet Take 2 mg by mouth 4 (four) times daily as needed for diarrhea or loose stools.   Yes [provider]  metoprolol tartrate (LOPRESSOR) 25 MG tablet TAKE 1/2 (ONE-HALF) TABLET BY MOUTH TWICE DAILY WITH MEALS Patient taking differently: Take 12.5 mg by mouth 2 (two) times daily. 10/18/19  Yes Mack Hook, MD  rosuvastatin (CRESTOR) 20 MG tablet Take 1 tablet (20 mg total) by mouth daily at 6 PM. 01/16/21 04/16/21 Yes [name], NP  AgaMatrix Ultra-Thin Lancets MISC Check blood glucose twice daily before meals. 10/18/19   Mack Hook, MD  aspirin EC 81 MG EC tablet Take 1 tablet (81 mg total) by mouth daily. Patient not taking: Reported on 04/11/2021 09/08/18   Mina Marble P, DO  Blood Glucose Monitoring Suppl [facility] PRESTO) w/Device KIT Check sugars twice daily 06/04/16   Mack Hook, MD  glucose blood (AGAMATRIX PRESTO TEST) test strip Check sugars twice daily 10/18/19   Mack Hook, MD  guaifenesin (ROBITUSSIN) 100 MG/5ML syrup Take 200 mg by mouth 3 (three) times daily as needed for cough.    [provider]  sucroferric oxyhydroxide (VELPHORO) 500 MG chewable tablet Chew 2 tablets (1,000 mg total) by mouth 3 (three) times daily with meals. Patient not taking: Reported on 04/11/2021 10/18/19   Mack Hook, MD     Family History  Problem Relation Age of Onset   Diabetes Mother    Kidney disease Mother        renal failure--diabetes, cause of death   Hypertension Mother    Diabetes Father    Kidney disease Father        Cause of death:  kidney failure form diabetes   Alcohol abuse Father    Diabetes Sister     Social History   Socioeconomic History   Marital status: Married    Spouse name: [name]   Number of children: 1   Years of education: 9   Highest education level: Not on file  Occupational History   Occupation: Curator  Tobacco Use   Smoking  status: Former    Types: Cigarettes   Smokeless tobacco: Never  Vaping Use   Vaping Use: Never used  Substance and Sexual Activity   Alcohol use: Not Currently   Drug use: No   Sexual activity: Not on file  Other Topics Concern   Not on file  Social History Narrative   Originally from [place]   Came to [employer]. In [place] with his girlfriend and their son.   Social Determinants of Health   Financial Resource Strain: Not on file  Food Insecurity: Not on file  Transportation Needs: Not on file  Physical Activity: Not on file  Stress: Not on file  Social Connections: Not on file    Review of Systems: A 12 point ROS discussed and pertinent positives are indicated in the HPI above.  All other systems are negative.  Review of Systems  Constitutional:  Negative for appetite change and fatigue.  Respiratory:  Negative for cough and shortness of breath.   Cardiovascular:  Negative for chest pain and leg  swelling.  Gastrointestinal:  Negative for abdominal pain, diarrhea, nausea and vomiting.  Neurological:  Negative for dizziness and headaches.   Vital Signs: BP (!) 200/96   Pulse 70   Temp 98.4 F (36.9 C) (Oral)   Resp 18   Ht '5\' 7"'  (1.702 m)   Wt 138 lb 14.2 oz (63 kg)   SpO2 99%   BMI 21.75 kg/m   Physical Exam Constitutional:      General: He is not in acute distress. HENT:     Mouth/Throat:     Mouth: Mucous membranes are moist.     Pharynx: Oropharynx is clear.  Cardiovascular:     Rate and Rhythm: Normal rate and regular rhythm.     Comments: Left upper arm AVF, positive thrill and bruit.  Pulmonary:     Effort: Pulmonary effort is normal.     Breath sounds: Normal breath sounds.  Abdominal:     General: Bowel sounds are normal.     Palpations: Abdomen is soft.  Skin:    General: Skin is warm and dry.  Neurological:     Mental Status: He is alert and oriented to person, place, and time.    Imaging: No results found.  Labs:  CBC: Recent Labs     01/15/21 1223  WBC 9.2  HGB 12.0*  HCT 36.0*  PLT 201    COAGS: No results for input(s): INR, APTT in the last 8760 hours.  BMP: Recent Labs    07/10/20 0813 01/15/21 1223  NA  --  142  K 4.5 5.3*  CL  --  94*  CO2  --  26  GLUCOSE  --  114*  BUN  --  44*  CALCIUM  --  10.2  CREATININE  --  7.25*    LIVER FUNCTION TESTS: Recent Labs    01/15/21 1223  BILITOT 0.3  AST 11  ALT 18  ALKPHOS 114  PROT 7.6  ALBUMIN 4.4    TUMOR MARKERS: No results for input(s): AFPTM, CEA, CA199, CHROMGRNA in the last 8760 hours.  Assessment and Plan:  ESRD; malfunctioning AVF: Blake Santana, 50 year old male, presents today to the [facility] department for an image-guided fistulogram with possible intervention, possible placement of a tunneled dialysis catheter. A Spanish-speaking interpreter was present.   Risks and benefits discussed with the patient including, but not limited to bleeding, infection, vascular injury, pulmonary embolism, need for tunneled HD catheter placement or even death.  All of the patient's questions were answered, patient is agreeable to proceed. He has been NPO.   Consent signed and in chart.   Thank you for this interesting consult.  I greatly enjoyed meeting Blake Santana and look forward to participating in their care.  A copy of this report was sent to the requesting provider on this date.  Electronically Signed: Soyla Dryer, AGACNP-BC [phone] 04/14/2021, 8:24 AM   I spent a total of  30 Minutes   in face to face in clinical consultation, greater than 50% of which was counseling/coordinating care for fistulogram with possible intervention.

## 2021-04-15 DIAGNOSIS — L299 Pruritus, unspecified: Secondary | ICD-10-CM | POA: Insufficient documentation

## 2021-04-17 NOTE — Progress Notes (Signed)
Erroneous encounter

## 2021-04-18 ENCOUNTER — Other Ambulatory Visit: Payer: Self-pay

## 2021-04-18 ENCOUNTER — Ambulatory Visit: Payer: Self-pay | Admitting: Family

## 2021-04-21 ENCOUNTER — Encounter: Payer: Self-pay | Admitting: Family

## 2021-04-21 DIAGNOSIS — E1122 Type 2 diabetes mellitus with diabetic chronic kidney disease: Secondary | ICD-10-CM

## 2021-04-21 DIAGNOSIS — E785 Hyperlipidemia, unspecified: Secondary | ICD-10-CM

## 2021-04-21 DIAGNOSIS — I10 Essential (primary) hypertension: Secondary | ICD-10-CM

## 2021-04-21 DIAGNOSIS — Z789 Other specified health status: Secondary | ICD-10-CM

## 2021-06-02 ENCOUNTER — Other Ambulatory Visit: Payer: Self-pay

## 2021-06-02 ENCOUNTER — Emergency Department (HOSPITAL_BASED_OUTPATIENT_CLINIC_OR_DEPARTMENT_OTHER): Payer: Self-pay

## 2021-06-02 ENCOUNTER — Emergency Department (HOSPITAL_COMMUNITY)
Admission: EM | Admit: 2021-06-02 | Discharge: 2021-06-03 | Disposition: A | Discharge status: Home / Self Care | Payer: Self-pay | Attending: Emergency Medicine | Admitting: Emergency Medicine

## 2021-06-02 ENCOUNTER — Encounter (HOSPITAL_COMMUNITY): Payer: Self-pay | Admitting: *Deleted

## 2021-06-02 DIAGNOSIS — M79672 Pain in left foot: Secondary | ICD-10-CM | POA: Insufficient documentation

## 2021-06-02 DIAGNOSIS — M79662 Pain in left lower leg: Secondary | ICD-10-CM | POA: Insufficient documentation

## 2021-06-02 DIAGNOSIS — Z5321 Procedure and treatment not carried out due to patient leaving prior to being seen by health care provider: Secondary | ICD-10-CM | POA: Insufficient documentation

## 2021-06-02 LAB — CBC WITH DIFFERENTIAL/PLATELET
Abs Immature Granulocytes: 0.03 10*3/uL (ref 0.00–0.07)
Basophils Absolute: 0.1 10*3/uL (ref 0.0–0.1)
Basophils Relative: 1 %
Eosinophils Absolute: 0.5 10*3/uL (ref 0.0–0.5)
Eosinophils Relative: 6 %
HCT: 36.6 % — ABNORMAL LOW (ref 39.0–52.0)
Hemoglobin: 11.6 g/dL — ABNORMAL LOW (ref 13.0–17.0)
Immature Granulocytes: 0 %
Lymphocytes Relative: 20 %
Lymphs Abs: 1.7 10*3/uL (ref 0.7–4.0)
MCH: 31.9 pg (ref 26.0–34.0)
MCHC: 31.7 g/dL (ref 30.0–36.0)
MCV: 100.5 fL — ABNORMAL HIGH (ref 80.0–100.0)
Monocytes Absolute: 0.6 10*3/uL (ref 0.1–1.0)
Monocytes Relative: 7 %
Neutro Abs: 5.8 10*3/uL (ref 1.7–7.7)
Neutrophils Relative %: 66 %
Platelets: 150 10*3/uL (ref 150–400)
RBC: 3.64 MIL/uL — ABNORMAL LOW (ref 4.22–5.81)
RDW: 13.3 % (ref 11.5–15.5)
WBC: 8.6 10*3/uL (ref 4.0–10.5)
nRBC: 0 % (ref 0.0–0.2)

## 2021-06-02 LAB — BASIC METABOLIC PANEL
Anion gap: 13 (ref 5–15)
BUN: 58 mg/dL — ABNORMAL HIGH (ref 6–20)
CO2: 30 mmol/L (ref 22–32)
Calcium: 9.6 mg/dL (ref 8.9–10.3)
Chloride: 98 mmol/L (ref 98–111)
Creatinine, Ser: 10.55 mg/dL — ABNORMAL HIGH (ref 0.61–1.24)
GFR, Estimated: 5 mL/min — ABNORMAL LOW (ref >60)
Glucose, Bld: 159 mg/dL — ABNORMAL HIGH (ref 70–99)
Potassium: 5.4 mmol/L — ABNORMAL HIGH (ref 3.5–5.1)
Sodium: 141 mmol/L (ref 135–145)

## 2021-06-02 NOTE — Progress Notes (Signed)
LLE venous duplex has been completed.  Preliminary results given to [employer], PA via secure messenger.   Results can be found under chart review under CV PROC. 06/02/2021 5:02 PM Brenee Gajda RVT, RDMS

## 2021-06-02 NOTE — ED Triage Notes (Signed)
C/o pain in the bottom of his foot onset 3 days ago

## 2021-06-02 NOTE — ED Provider Notes (Signed)
Emergency Medicine Provider Triage Evaluation Note  Blake Santana , a 50 y.o. male  was evaluated in triage.  Pt complains of left foot pain x3 days.  No injury.  Patient is complaining of pain on the bottom of his left foot as well as his left calf.  He states that he was walking in the morning, noticed more pain later in the afternoon that is persisted.  His pain is made worse with all movement of his left foot and weightbearing.  He has not been taking anything for pain.  No history of blood clots.  States that he is fairly sedentary at home.  He is a dialysis patient, reports intermittent shortness of breath, but no worsening since his foot pain started.  Spanish interpreter used during interview  Review of Systems  Positive: Left foot and calf pain Negative: Fevers, chills  Physical Exam  BP (!) 185/82 (BP Location: Right Arm)   Pulse 63   Temp 98.3 F (36.8 C) (Oral)   Resp 18   Ht 5\' 3"  (1.6 m)   Wt 64 kg   SpO2 100%   BMI 24.99 kg/m  Gen:   Awake, no distress   Resp:  Normal effort  MSK:   Moves extremities without difficulty  Other:  Tenderness to palpation of palmar aspect of left foot and left calf, pulses equal and normal in bilateral lower extremities  Medical Decision Making  Medically screening exam initiated at 3:44 PM.  Appropriate orders placed.  Blake Santana was informed that the remainder of the evaluation will be completed by another provider, this initial triage assessment does not replace that evaluation, and the importance of remaining in the ED until their evaluation is complete.     Kateri Plummer, PA-C 06/02/21 1546    Lorelle Gibbs, DO 06/03/21 0725

## 2021-06-03 NOTE — ED Notes (Signed)
Pt came to me stating hat he is leaving.

## 2021-06-04 ENCOUNTER — Emergency Department (HOSPITAL_COMMUNITY): Payer: Self-pay

## 2021-06-04 ENCOUNTER — Emergency Department (HOSPITAL_COMMUNITY)
Admission: EM | Admit: 2021-06-04 | Discharge: 2021-06-04 | Disposition: A | Discharge status: Home / Self Care | Payer: Self-pay | Attending: Emergency Medicine | Admitting: Emergency Medicine

## 2021-06-04 ENCOUNTER — Encounter (HOSPITAL_COMMUNITY): Payer: Self-pay | Admitting: Emergency Medicine

## 2021-06-04 DIAGNOSIS — Z79899 Other long term (current) drug therapy: Secondary | ICD-10-CM | POA: Insufficient documentation

## 2021-06-04 DIAGNOSIS — Z7982 Long term (current) use of aspirin: Secondary | ICD-10-CM | POA: Insufficient documentation

## 2021-06-04 DIAGNOSIS — Z992 Dependence on renal dialysis: Secondary | ICD-10-CM | POA: Insufficient documentation

## 2021-06-04 DIAGNOSIS — I739 Peripheral vascular disease, unspecified: Secondary | ICD-10-CM | POA: Insufficient documentation

## 2021-06-04 DIAGNOSIS — M109 Gout, unspecified: Secondary | ICD-10-CM | POA: Insufficient documentation

## 2021-06-04 DIAGNOSIS — I132 Hypertensive heart and chronic kidney disease with heart failure and with stage 5 chronic kidney disease, or end stage renal disease: Secondary | ICD-10-CM | POA: Insufficient documentation

## 2021-06-04 DIAGNOSIS — N186 End stage renal disease: Secondary | ICD-10-CM | POA: Insufficient documentation

## 2021-06-04 DIAGNOSIS — E1122 Type 2 diabetes mellitus with diabetic chronic kidney disease: Secondary | ICD-10-CM | POA: Insufficient documentation

## 2021-06-04 DIAGNOSIS — Z7984 Long term (current) use of oral hypoglycemic drugs: Secondary | ICD-10-CM | POA: Insufficient documentation

## 2021-06-04 DIAGNOSIS — Z87891 Personal history of nicotine dependence: Secondary | ICD-10-CM | POA: Insufficient documentation

## 2021-06-04 DIAGNOSIS — I509 Heart failure, unspecified: Secondary | ICD-10-CM | POA: Insufficient documentation

## 2021-06-04 DIAGNOSIS — Z8616 Personal history of COVID-19: Secondary | ICD-10-CM | POA: Insufficient documentation

## 2021-06-04 MED ORDER — COLCHICINE 0.6 MG PO TABS
1.2000 mg | ORAL_TABLET | Freq: Once | ORAL | Status: AC
  Administered 2021-06-04: 1.2 mg via ORAL
  Filled 2021-06-04: qty 2

## 2021-06-04 MED ORDER — COLCHICINE 0.6 MG PO TABS: 0.6000 mg | ORAL_TABLET | Freq: Every day | ORAL | 0 refills | Status: DC

## 2021-06-04 MED ORDER — HYDROCODONE-ACETAMINOPHEN 5-325 MG PO TABS: 1.0000 | ORAL_TABLET | Freq: Four times a day (QID) | ORAL | 0 refills | Status: AC | PRN

## 2021-06-04 NOTE — ED Notes (Signed)
Not answering for vitals

## 2021-06-04 NOTE — ED Notes (Signed)
Pt verbalized understanding of d/c instructions, meds and followup care. Denies questions. VSS, no distress noted. Steady gait to exit with all belongings.  ?

## 2021-06-04 NOTE — ED Provider Notes (Signed)
[facility] EMERGENCY DEPARTMENT Provider Note   CSN: [id number] Arrival date & time: 06/04/21  0177     History Chief Complaint  Patient presents with   Foot Pain    Blake Santana is a 50 y.o. male with a past medical history of congestive heart failure, diabetes, chronic kidney disease and hypertension presenting today with a complaint of left leg pain.  On Friday he began to have the pain in his calf down to his foot and it got worse over the weekend.  On Monday he was seen and had an ultrasound however nobody told him the results of this.  Today he returns with continued pain that is not successfully being treated with Tylenol.  Has not yet seen his primary care provider.  Also has not taken hypertensive medications today while waiting in the emergency department.   Foot Pain Pertinent negatives include no chest pain and no shortness of breath.      Past Medical History:  Diagnosis Date   Acute respiratory failure with hypoxia ([place]) 09/07/2018   Anemia of chronic kidney failure 03/11/2017   Cataracts, bilateral 2019   Chest pain 09/01/2018   CHF (congestive heart failure) ([place])    Chronic kidney disease 03/11/2017   Diabetes mellitus without complication ([place]) 9390   Edema 01/14/2017   Headache    Hypercholesterolemia    Hypertension 03/11/2017   Hypertensive retinopathy of both eyes 2019   Iron deficiency anemia 06/18/2016   Pneumonia 09/07/2018   Proliferative diabetic retinopathy with macular edema ([place]) 2019   Retinal hemorrhage, bilateral and left vitreous hemorrhage.  Being treated with Lucentis.  Dr. Iona Hansen   RSV (respiratory syncytial virus infection) 08/2017    Patient Active Problem List   Diagnosis Date Noted   Pneumonia due to COVID-19 virus 03/30/2020   Acute respiratory failure due to COVID-19 ([place]) 03/30/2020   Sepsis ([place]) 03/30/2020   Noncompliance 02/24/2020   TB (tuberculosis) contact 11/27/2018   COVID-19 virus detected     Hypoxia    Abnormal EKG    Volume overload 09/02/2018   Pleural effusion, bilateral 09/02/2018   Atypical chest pain 09/01/2018   Acute respiratory failure with hypoxia ([place]) 09/01/2018   Community acquired pneumonia    Elevated troponin    Malnutrition of moderate degree 08/11/2017   RSV (respiratory syncytial virus infection) 08/09/2017   ESRD (end stage renal disease) on dialysis ([place]) 08/08/2017   Proliferative diabetic retinopathy with macular edema ([place]) 08/03/2017   Hypertensive retinopathy of both eyes 08/03/2017   Cataracts, bilateral 08/03/2017   Acute exacerbation of CHF (congestive heart failure) ([place]) 04/03/2017   Anemia of chronic kidney failure 03/11/2017   Hypertension 03/11/2017   Chronic kidney disease 03/11/2017   Edema 01/14/2017   Dyslipidemia 10/15/2016   Diabetic retinopathy ([place]) 10/15/2016   Microalbuminuria 06/18/2016   Normocytic anemia 06/18/2016   Iron deficiency anemia 06/18/2016   Diabetes mellitus with ESRD (end-stage renal disease) ([place]) 10/20/2001    Past Surgical History:  Procedure Laterality Date   A/V FISTULAGRAM Left 01/19/2018   Procedure: A/V FISTULAGRAM - Left AV;  Surgeon: Waynetta Sandy, MD;  Location: [facility];  Service: Cardiovascular;  Laterality: Left;   AV FISTULA PLACEMENT Left 08/09/2017   Procedure: LEFT RADIOCEPHALIC ARTERIOVENOUS (AV) FISTULA CREATION;  Surgeon: [name], MD;  Location: [facility];  Service: Vascular;  Laterality: Left;   AV FISTULA PLACEMENT Left 02/02/2018   Procedure: ARTERIOVENOUS (AV) FISTULA CREATION LEFT UPPER EXTREMITY;  Surgeon: [name], MD;  Location: [facility];  Service: Vascular;  Laterality: Left;   INSERTION OF DIALYSIS CATHETER  08/09/2017   INSERTION OF DIALYSIS CATHETER Right 08/09/2017   Procedure: INSERTION OF DIALYSIS CATHETER RIGHT INTERNAL JUGULAR;  Surgeon: [name], MD;  Location: [facility];  Service: Vascular;  Laterality: Right;   IR DIALY SHUNT INTRO  NEEDLE/INTRACATH INITIAL W/IMG LEFT Left 07/10/2020   IR DIALY SHUNT INTRO NEEDLE/INTRACATH INITIAL W/IMG LEFT Left 04/14/2021   IR REMOVAL TUN CV CATH W/O FL  07/01/2018   LEFT HEART CATH AND CORONARY ANGIOGRAPHY N/A 09/05/2018   Procedure: LEFT HEART CATH AND CORONARY ANGIOGRAPHY;  Surgeon: [name], MD;  Location: [facility];  Service: Cardiovascular;  Laterality: N/A;       Family History  Problem Relation Age of Onset   Diabetes Mother    Kidney disease Mother        renal failure--diabetes, cause of death   Hypertension Mother    Diabetes Father    Kidney disease Father        Cause of death:  kidney failure form diabetes   Alcohol abuse Father    Diabetes Sister     Social History   Tobacco Use   Smoking status: Former    Types: Cigarettes   Smokeless tobacco: Never  Vaping Use   Vaping Use: Never used  Substance Use Topics   Alcohol use: Not Currently   Drug use: No    Home Medications Prior to Admission medications   Medication Sig Start Date End Date Taking? Authorizing Provider  acetaminophen (TYLENOL) 500 MG tablet Take 1,000 mg by mouth every 6 (six) hours as needed for moderate pain or headache.    [provider]  AgaMatrix Ultra-Thin Lancets MISC Check blood glucose twice daily before meals. 10/18/19   Mack Hook, MD  amLODipine (NORVASC) 5 MG tablet Take 1 tablet by mouth once daily with breakfast Patient taking differently: Take 5 mg by mouth daily. 10/18/19   Mack Hook, MD  aspirin EC 81 MG EC tablet Take 1 tablet (81 mg total) by mouth daily. Patient not taking: Reported on 04/11/2021 09/08/18   Mina Marble P, DO  b complex-vitamin c-folic acid (NEPHRO-VITE) 0.8 MG TABS tablet Take 1 tablet by mouth Every Tuesday,Thursday,and Saturday with dialysis.  08/11/18   [provider]  Blood Glucose Monitoring Suppl (AGAMATRIX PRESTO) w/Device KIT Check sugars twice daily 06/04/16   Mack Hook, MD   cinacalcet (SENSIPAR) 60 MG tablet Take 60 mg by mouth daily.    [provider]  glipiZIDE (GLUCOTROL) 10 MG tablet Take 1 tablet (10 mg total) by mouth 2 (two) times daily before a meal. 01/16/21 04/16/21  [name], NP  glucose blood (AGAMATRIX PRESTO TEST) test strip Check sugars twice daily 10/18/19   Mack Hook, MD  guaiFENesin (MUCINEX) 600 MG 12 hr tablet Take 600 mg by mouth 2 (two) times daily as needed for cough.    [provider]  guaifenesin (ROBITUSSIN) 100 MG/5ML syrup Take 200 mg by mouth 3 (three) times daily as needed for cough.    [provider]  lanthanum (FOSRENOL) 1000 MG chewable tablet Chew 2,000 mg by mouth 3 (three) times daily with meals.    [provider]  lidocaine-prilocaine (EMLA) cream Apply 1 application topically 3 (three) times a week. Use at dialysis 02/17/21   [provider]  loperamide (IMODIUM A-D) 2 MG tablet Take 2 mg by  mouth 4 (four) times daily as needed for diarrhea or loose stools.    [provider]  metoprolol tartrate (LOPRESSOR) 25 MG tablet TAKE 1/2 (ONE-HALF) TABLET BY MOUTH TWICE DAILY WITH MEALS Patient taking differently: Take 12.5 mg by mouth 2 (two) times daily. 10/18/19   Mack Hook, MD  rosuvastatin (CRESTOR) 20 MG tablet Take 1 tablet (20 mg total) by mouth daily at 6 PM. 01/16/21 04/16/21  [name], NP  sucroferric oxyhydroxide (VELPHORO) 500 MG chewable tablet Chew 2 tablets (1,000 mg total) by mouth 3 (three) times daily with meals. Patient not taking: Reported on 04/11/2021 10/18/19   Mack Hook, MD    Allergies    Patient has no known allergies.  Review of Systems   Review of Systems  Constitutional:  Negative for chills and fever.  Respiratory:  Negative for cough and shortness of breath.   Cardiovascular:  Negative for chest pain, palpitations and leg swelling.  Gastrointestinal:  Negative for diarrhea, nausea and vomiting.   Musculoskeletal:  Positive for arthralgias. Negative for myalgias.  Skin:  Negative for wound.  All other systems reviewed and are negative.  Physical Exam Updated Vital Signs BP (!) 205/100 (BP Location: Right Arm)   Pulse 73   Temp 98.6 F (37 C) (Oral)   Resp 18   SpO2 97%   Physical Exam Vitals and nursing note reviewed.  Constitutional:      Appearance: Normal appearance.  HENT:     Head: Normocephalic and atraumatic.  Eyes:     General: No scleral icterus.    Conjunctiva/sclera: Conjunctivae normal.  Pulmonary:     Effort: Pulmonary effort is normal. No respiratory distress.  Musculoskeletal:        General: Tenderness present. No swelling. Normal range of motion.     Comments: Patient with tenderness to palpation of calcaneus and anterior shin.  Neurovascularly intact.  Able to wiggle toes.  Full range of motion at the ankle.  No signs of bruising or varicose veins.  Skin:    General: Skin is warm and dry.     Capillary Refill: Capillary refill takes less than 2 seconds.     Findings: No bruising or rash.  Neurological:     General: No focal deficit present.     Mental Status: He is alert.  Psychiatric:        Mood and Affect: Mood normal.        Behavior: Behavior normal.    ED Results / Procedures / Treatments   Labs (all labs ordered are listed, but only abnormal results are displayed) Labs Reviewed - No data to display  EKG None  Radiology DG Foot Complete Left  Result Date: 06/04/2021 CLINICAL DATA:  Plantar hindfoot pain with weight-bearing for several days. EXAM: LEFT FOOT - COMPLETE 3+ VIEW COMPARISON:  03/20/2018 FINDINGS: There is no evidence of acute fracture or dislocation. No evidence of acute osteolysis or periostitis. Old fracture deformity of the calcaneus is unchanged in appearance. Generalized osteopenia is again noted. Marginal erosion is again seen involving proximal phalanx of the great toe at the 1st MCP joint which is stable, and  suspicious for gout. No other sites of arthropathy identified. Extensive peripheral vascular calcification is again seen. IMPRESSION: No acute findings. Stable appearance of marginal erosion at the 1st MCP joint, suspicious for gout. Osteopenia and old calcaneal fracture deformity. Extensive peripheral vascular calcification. Electronically Signed   By: Marlaine Hind M.D.   On: 06/04/2021 10:47   VAS  [place] LOWER EXTREMITY VENOUS (DVT) (7a-7p)  Result Date: 06/03/2021  Lower Venous DVT Study Patient Name:  RAEFORD BRANDENBURG ACEVEDO-OCEJO  Date of Exam:   06/02/2021 Medical Rec #: [id number]                 Accession #:    [id number] Date of Birth: 12/06/1970                 Patient Gender: M Patient Age:   25 years Exam Location:  [facility] Procedure:      VAS [place] LOWER EXTREMITY VENOUS (DVT) Referring Phys: Audrie Gallus ROEMHILDT --------------------------------------------------------------------------------  Indications: LLE calf and foot pain.  Limitations: Calcific shadowing. Comparison Study: No previous exams Performing Technologist: Jody Hill RVT, RDMS  Examination Guidelines: A complete evaluation includes B-mode imaging, spectral Doppler, color Doppler, and power Doppler as needed of all accessible portions of each vessel. Bilateral testing is considered an integral part of a complete examination. Limited examinations for reoccurring indications may be performed as noted. The reflux portion of the exam is performed with the patient in reverse Trendelenburg.  +-----+---------------+---------+-----------+----------+--------------+ RIGHTCompressibilityPhasicitySpontaneityPropertiesThrombus Aging +-----+---------------+---------+-----------+----------+--------------+ CFV  Full           Yes      Yes                                 +-----+---------------+---------+-----------+----------+--------------+   +---------+---------------+---------+-----------+----------+--------------+ LEFT      CompressibilityPhasicitySpontaneityPropertiesThrombus Aging +---------+---------------+---------+-----------+----------+--------------+ CFV      Full           Yes      Yes                                 +---------+---------------+---------+-----------+----------+--------------+ SFJ      Full                                                        +---------+---------------+---------+-----------+----------+--------------+ FV Prox  Full           Yes      Yes                                 +---------+---------------+---------+-----------+----------+--------------+ FV Mid   Full           Yes      Yes                                 +---------+---------------+---------+-----------+----------+--------------+ FV DistalFull           Yes      Yes                                 +---------+---------------+---------+-----------+----------+--------------+ PFV      Full                                                        +---------+---------------+---------+-----------+----------+--------------+  POP      Full           Yes      Yes                                 +---------+---------------+---------+-----------+----------+--------------+ PTV      Full                                                        +---------+---------------+---------+-----------+----------+--------------+ PERO     Full                                                        +---------+---------------+---------+-----------+----------+--------------+    Summary: RIGHT: - No evidence of common femoral vein obstruction.  LEFT: - There is no evidence of deep vein thrombosis in the lower extremity. - There is no evidence of superficial venous thrombosis.  - No cystic structure found in the popliteal fossa.  *See table(s) above for measurements and observations. Electronically signed by Deitra Mayo MD on 06/03/2021 at 2:02:41 PM.    Final     Procedures Procedures    Medications Ordered in ED Medications - No data to display  ED Course  I have reviewed the triage vital signs and the nursing notes.  Pertinent labs & imaging results that were available during my care of the patient were reviewed by me and considered in my medical decision making (see chart for details).    MDM Rules/Calculators/A&P Patient with later by me, he was in no acute distress.  We used the iPad interpreter to communicate.  His blood pressure was noted to be elevated however he states that he has not taken his medications for his hypertension some setting in the emergency department.  He denies any headache, chest pain or other malignant results of hypertension.  He had obtained a DVT study yesterday but was not told the results.  I shared that he did not have a blood clot however his x-ray showed some evidence of gout and peripheral artery disease.  We discussed this in Spanish and information about these diagnoses were also attached to his discharge papers.  He is agreeable to following up with his primary care provider.  We discussed that I would put him on a medication for gout flares.  I am unable to put him on steroids due to his diabetes, so he was given a dose of colchicine in the department today.  We will take another small dose later when he is discharged.  Also sent with Vicodin for pain control over the next couple of days until he can get in with his primary care provider.  He is agreeable to this plan, ambulatory and stable for discharge at this time.  Final Clinical Impression(s) / ED Diagnoses Final diagnoses:  Acute gout of left foot, unspecified cause  Peripheral artery disease ([place])    Rx / DC Orders Results and diagnoses were explained to the patient. Return precautions discussed in full. Patient had no additional questions and expressed complete understanding.     Rhae Hammock, PA-C 06/04/21 1710  Sherwood Gambler, MD 06/09/21 [phone]

## 2021-06-04 NOTE — Discharge Instructions (Addendum)
Su ultrasonido de [employer] parece normal. Su radiografa de hoy muestra enfermedad arterial perifrica y Associate Professor. Debe hacer un seguimiento con su proveedor de atencin primaria sobre esto. Te he dado un medicamento para la gota hoy. Te he enviado una dosis ms para que la tomes en una hora, cuando llegues a casa. El analgsico es una sustancia controlada con posibilidad de adiccin. Tmelo solo si es necesario para Conservation officer, historic buildings intenso. No tome tylenol con l porque el medicamento tiene tylenol incorporado.  Fue Engineer, drilling conocerte y espero que te sientas mejor.

## 2021-06-04 NOTE — ED Provider Notes (Signed)
Emergency Medicine Provider Triage Evaluation Note  Blake Santana , a 50 y.o. male  was evaluated in triage.  Pt complains of left foot pain.  States that on Friday he was cutting the grass and doing work without pain or difficulty.  States that after that he started having left foot pain meds predominantly on the plantar surface.  Able to bear weight with limping gait.  He also endorses left calf pain.  Denies shortness of breath or swelling to the leg.  Appears that he was here on 06/02/21 for same.  Had DVT study which was negative.  Does have history of diabetes on dialysis.  Has any fevers.  Review of Systems  Positive: Foot pain Negative: Fever  Physical Exam  BP (!) 187/87 (BP Location: Right Arm)   Pulse 83   Temp 98.1 F (36.7 C)   Resp 18   SpO2 95%  Gen:   Awake, no distress   Resp:  Normal effort  MSK:   Moves extremities without difficulty.  Limping gait using cane. Other:  Bilateral lower extremities strength 5/5.  DP pulses 2+ bilaterally.  No appreciable swelling of the left calf or foot.  There is no redness or obvious wounds to the left foot.  Tenderness to palpation of the left plantar surface of the left foot and calcaneus.  Medical Decision Making  Medically screening exam initiated at 10:08 AM.  Appropriate orders placed.  Blake Santana was informed that the remainder of the evaluation will be completed by another provider, this initial triage assessment does not replace that evaluation, and the importance of remaining in the ED until their evaluation is complete.     Mickie Hillier, PA-C 06/04/21 1012    Blanchie Dessert, MD 06/09/21 1410

## 2021-06-04 NOTE — ED Triage Notes (Signed)
Patient complains of left foot and left calc pain that started several days ago, history of diabetes. Denies shortness of breath. Patient alert, oriented, ambulatory, and in no apparent distress at this time. Seen for same 06/02/2021.

## 2021-06-09 ENCOUNTER — Ambulatory Visit (INDEPENDENT_AMBULATORY_CARE_PROVIDER_SITE_OTHER): Payer: Self-pay | Admitting: Nurse Practitioner

## 2021-06-09 ENCOUNTER — Other Ambulatory Visit: Payer: Self-pay

## 2021-06-09 VITALS — HR 89

## 2021-06-09 DIAGNOSIS — M25572 Pain in left ankle and joints of left foot: Secondary | ICD-10-CM

## 2021-06-09 DIAGNOSIS — M10072 Idiopathic gout, left ankle and foot: Secondary | ICD-10-CM

## 2021-06-09 DIAGNOSIS — I739 Peripheral vascular disease, unspecified: Secondary | ICD-10-CM

## 2021-06-09 MED ORDER — PREDNISONE 10 MG PO TABS: 10.0000 mg | ORAL_TABLET | Freq: Every day | ORAL | 0 refills | Status: DC

## 2021-06-09 MED ORDER — AGAMATRIX ULTRA-THIN LANCETS MISC: 11 refills | Status: AC

## 2021-06-09 MED ORDER — AGAMATRIX PRESTO TEST VI STRP: ORAL_STRIP | 12 refills | Status: DC

## 2021-06-09 NOTE — Progress Notes (Signed)
_0  ID: Blake Santana, male    DOB: [birth year], 50 y.o.   MRN: [id number]  Chief Complaint  Patient presents with   Follow-up    Referring provider: [name], NP  HPI  Patient presents today for ED follow-up.  Spanish interpreter was used for this visit.  He was seen in the ED on 06/04/2021 and diagnosed with gout in the left foot.  He was prescribed colchicine.  He states that he has had some improvement with this medication.  He states that his pain is mainly on the plantar side of his foot near his heel.  He does have some pain in his left calf as well.  He did have a ultrasound done that showed no DVT, but did reveal PAD.  We will place a referral to vascular for this.  We discussed that this sounds more like [place] fasciitis than gout. Denies f/c/s, n/v/d, hemoptysis, PND, chest pain or edema.      No Known Allergies  Immunization History  Administered Date(s) Administered   Hepatitis B, adult 10/05/2017, 11/02/2017, 12/04/2017, 04/02/2018   Influenza Whole 07/02/2006   Influenza, Quadrivalent, Recombinant, Inj, Pf 04/27/2019   Influenza,inj,Quad PF,6+ Mos 04/23/2017   Influenza-Unspecified 04/23/2017, 05/10/2018   PFIZER(Purple Top)SARS-COV-2 Vaccination 10/14/2019   Pneumococcal Conjugate-13 10/05/2017   Pneumococcal Polysaccharide-23 01/06/2005, 05/05/2018   Td 01/06/2005   Tdap 01/15/2017    Past Medical History:  Diagnosis Date   Acute respiratory failure with hypoxia ([place]) 09/07/2018   Anemia of chronic kidney failure 03/11/2017   Cataracts, bilateral 2019   Chest pain 09/01/2018   CHF (congestive heart failure) ([place])    Chronic kidney disease 03/11/2017   Diabetes mellitus without complication ([place]) 1740   Edema 01/14/2017   Headache    Hypercholesterolemia    Hypertension 03/11/2017   Hypertensive retinopathy of both eyes 2019   Iron deficiency anemia 06/18/2016   Pneumonia 09/07/2018   Proliferative diabetic retinopathy with macular edema  ([place]) 2019   Retinal hemorrhage, bilateral and left vitreous hemorrhage.  Being treated with Lucentis.  Dr. Iona Hansen   RSV (respiratory syncytial virus infection) 08/2017    Tobacco History: Social History   Tobacco Use  Smoking Status Former   Types: Cigarettes  Smokeless Tobacco Never   Counseling given: Yes   Outpatient Encounter Medications as of 06/09/2021  Medication Sig   predniSONE (DELTASONE) 10 MG tablet Take 1 tablet (10 mg total) by mouth daily with breakfast for 5 days.   acetaminophen (TYLENOL) 500 MG tablet Take 1,000 mg by mouth every 6 (six) hours as needed for moderate pain or headache.   AgaMatrix Ultra-Thin Lancets MISC Check blood glucose twice daily before meals.   amLODipine (NORVASC) 5 MG tablet Take 1 tablet by mouth once daily with breakfast (Patient taking differently: Take 5 mg by mouth daily.)   aspirin EC 81 MG EC tablet Take 1 tablet (81 mg total) by mouth daily. (Patient not taking: Reported on 04/11/2021)   b complex-vitamin c-folic acid (NEPHRO-VITE) 0.8 MG TABS tablet Take 1 tablet by mouth Every Tuesday,Thursday,and Saturday with dialysis.    Blood Glucose Monitoring Suppl (AGAMATRIX PRESTO) w/Device KIT Check sugars twice daily   cinacalcet (SENSIPAR) 60 MG tablet Take 60 mg by mouth daily.   colchicine 0.6 MG tablet Take 1 tablet (0.6 mg total) by mouth daily for 1 day.   glipiZIDE (GLUCOTROL) 10 MG tablet Take 1 tablet (10 mg total) by mouth 2 (two) times daily before a meal.   glucose  blood (AGAMATRIX PRESTO TEST) test strip Check sugars twice daily   guaiFENesin (MUCINEX) 600 MG 12 hr tablet Take 600 mg by mouth 2 (two) times daily as needed for cough.   guaifenesin (ROBITUSSIN) 100 MG/5ML syrup Take 200 mg by mouth 3 (three) times daily as needed for cough.   lanthanum (FOSRENOL) 1000 MG chewable tablet Chew 2,000 mg by mouth 3 (three) times daily with meals.   lidocaine-prilocaine (EMLA) cream Apply 1 application topically 3 (three) times a week.  Use at dialysis   loperamide (IMODIUM A-D) 2 MG tablet Take 2 mg by mouth 4 (four) times daily as needed for diarrhea or loose stools.   metoprolol tartrate (LOPRESSOR) 25 MG tablet TAKE 1/2 (ONE-HALF) TABLET BY MOUTH TWICE DAILY WITH MEALS (Patient taking differently: Take 12.5 mg by mouth 2 (two) times daily.)   rosuvastatin (CRESTOR) 20 MG tablet Take 1 tablet (20 mg total) by mouth daily at 6 PM.   sucroferric oxyhydroxide (VELPHORO) 500 MG chewable tablet Chew 2 tablets (1,000 mg total) by mouth 3 (three) times daily with meals. (Patient not taking: Reported on 04/11/2021)   [DISCONTINUED] AgaMatrix Ultra-Thin Lancets MISC Check blood glucose twice daily before meals.   [DISCONTINUED] glucose blood (AGAMATRIX PRESTO TEST) test strip Check sugars twice daily   No facility-administered encounter medications on file as of 06/09/2021.     Review of Systems  Review of Systems  Constitutional: Negative.   HENT: Negative.    Cardiovascular: Negative.   Gastrointestinal: Negative.   Musculoskeletal:        Foot pain  Allergic/Immunologic: Negative.   Neurological: Negative.   Psychiatric/Behavioral: Negative.        Physical Exam  Pulse 89   SpO2 98%   Wt Readings from Last 5 Encounters:  06/02/21 141 lb 1.5 oz (64 kg)  04/14/21 138 lb 14.2 oz (63 kg)  01/15/21 140 lb 12.8 oz (63.9 kg)  12/18/20 143 lb (64.9 kg)  11/20/20 141 lb 12.8 oz (64.3 kg)     Physical Exam Musculoskeletal:     Left foot: Normal range of motion. No deformity or bunion.       Feet:  Feet:     Left foot:     Skin integrity: No skin breakdown, erythema or warmth.     Comments: Tenderness to area noted    Lab Results:  CBC    Component Value Date/Time   WBC 8.6 06/02/2021 1545   RBC 3.64 (L) 06/02/2021 1545   HGB 11.6 (L) 06/02/2021 1545   HGB 12.0 (L) 01/15/2021 1223   HCT 36.6 (L) 06/02/2021 1545   HCT 36.0 (L) 01/15/2021 1223   PLT 150 06/02/2021 1545   PLT 201 01/15/2021 1223   MCV  100.5 (H) 06/02/2021 1545   MCV 96 01/15/2021 1223   MCH 31.9 06/02/2021 1545   MCHC 31.7 06/02/2021 1545   RDW 13.3 06/02/2021 1545   RDW 14.2 01/15/2021 1223   LYMPHSABS 1.7 06/02/2021 1545   LYMPHSABS 2.5 11/15/2019 1014   MONOABS 0.6 06/02/2021 1545   EOSABS 0.5 06/02/2021 1545   EOSABS 0.5 (H) 11/15/2019 1014   BASOSABS 0.1 06/02/2021 1545   BASOSABS 0.1 11/15/2019 1014    BMET    Component Value Date/Time   NA 141 06/02/2021 1545   NA 142 01/15/2021 1223   K 5.4 (H) 06/02/2021 1545   CL 98 06/02/2021 1545   CO2 30 06/02/2021 1545   GLUCOSE 159 (H) 06/02/2021 1545   BUN 58 (H) 06/02/2021  1545   BUN 44 (H) 01/15/2021 1223   CREATININE 10.55 (H) 06/02/2021 1545   CALCIUM 9.6 06/02/2021 1545   GFRNONAA 5 (L) 06/02/2021 1545   GFRAA 11 (L) 04/02/2020 0431    BNP    Component Value Date/Time   BNP 2,559.3 (H) 11/22/2018 1018    ProBNP No results found for: PROBNP  Imaging: DG Foot Complete Left  Result Date: 06/04/2021 CLINICAL DATA:  Plantar hindfoot pain with weight-bearing for several days. EXAM: LEFT FOOT - COMPLETE 3+ VIEW COMPARISON:  03/20/2018 FINDINGS: There is no evidence of acute fracture or dislocation. No evidence of acute osteolysis or periostitis. Old fracture deformity of the calcaneus is unchanged in appearance. Generalized osteopenia is again noted. Marginal erosion is again seen involving proximal phalanx of the great toe at the 1st MCP joint which is stable, and suspicious for gout. No other sites of arthropathy identified. Extensive peripheral vascular calcification is again seen. IMPRESSION: No acute findings. Stable appearance of marginal erosion at the 1st MCP joint, suspicious for gout. Osteopenia and old calcaneal fracture deformity. Extensive peripheral vascular calcification. Electronically Signed   By: Marlaine Hind M.D.   On: 06/04/2021 10:47   VAS [place] LOWER EXTREMITY VENOUS (DVT) (7a-7p)  Result Date: 06/03/2021  Lower Venous DVT Study  Patient Name:  Blake Santana  Date of Exam:   06/02/2021 Medical Rec #: [id number]                 Accession #:    [id number] Date of Birth: [birth year]                 Patient Gender: M Patient Age:   32 years Exam Location:  [facility] Procedure:      VAS [place] LOWER EXTREMITY VENOUS (DVT) Referring Phys: Audrie Gallus ROEMHILDT --------------------------------------------------------------------------------  Indications: LLE calf and foot pain.  Limitations: Calcific shadowing. Comparison Study: No previous exams Performing Technologist: Jody Hill RVT, RDMS  Examination Guidelines: A complete evaluation includes B-mode imaging, spectral Doppler, color Doppler, and power Doppler as needed of all accessible portions of each vessel. Bilateral testing is considered an integral part of a complete examination. Limited examinations for reoccurring indications may be performed as noted. The reflux portion of the exam is performed with the patient in reverse Trendelenburg.  +-----+---------------+---------+-----------+----------+--------------+ RIGHTCompressibilityPhasicitySpontaneityPropertiesThrombus Aging +-----+---------------+---------+-----------+----------+--------------+ CFV  Full           Yes      Yes                                 +-----+---------------+---------+-----------+----------+--------------+   +---------+---------------+---------+-----------+----------+--------------+ LEFT     CompressibilityPhasicitySpontaneityPropertiesThrombus Aging +---------+---------------+---------+-----------+----------+--------------+ CFV      Full           Yes      Yes                                 +---------+---------------+---------+-----------+----------+--------------+ SFJ      Full                                                        +---------+---------------+---------+-----------+----------+--------------+ FV Prox  Full           Yes  Yes                                  +---------+---------------+---------+-----------+----------+--------------+ FV Mid   Full           Yes      Yes                                 +---------+---------------+---------+-----------+----------+--------------+ FV DistalFull           Yes      Yes                                 +---------+---------------+---------+-----------+----------+--------------+ PFV      Full                                                        +---------+---------------+---------+-----------+----------+--------------+ POP      Full           Yes      Yes                                 +---------+---------------+---------+-----------+----------+--------------+ PTV      Full                                                        +---------+---------------+---------+-----------+----------+--------------+ PERO     Full                                                        +---------+---------------+---------+-----------+----------+--------------+    Summary: RIGHT: - No evidence of common femoral vein obstruction.  LEFT: - There is no evidence of deep vein thrombosis in the lower extremity. - There is no evidence of superficial venous thrombosis.  - No cystic structure found in the popliteal fossa.  *See table(s) above for measurements and observations. Electronically signed by Deitra Mayo MD on 06/03/2021 at 2:02:41 PM.    Final      Assessment & Plan:   Pain of joint of left ankle and foot Plantar fasciitis Gout:  Will refer to podiatry - diagnosed with gout in ED - also concerned for plantar fasciitis  Will order low dose prednisone - please monitor blood sugar closely - will refill lancets and strips   PAD:  Will place referral to vascular   Follow up:  Follow up with PCP as scheduled     Fenton Foy, NP 06/10/2021

## 2021-06-09 NOTE — Patient Instructions (Addendum)
Plantar fasciitis Gout:  Will refer to podiatry - diagnosed with gout in ED - also concerned for plantar fasciitis  Will order low dose prednisone - please monitor blood sugar closely - will refill lancets and strips   PAD:  Will place referral to vascular   Follow up:  Follow up with PCP as scheduled

## 2021-06-10 ENCOUNTER — Encounter: Payer: Self-pay | Admitting: Nurse Practitioner

## 2021-06-10 DIAGNOSIS — M25572 Pain in left ankle and joints of left foot: Secondary | ICD-10-CM | POA: Insufficient documentation

## 2021-06-10 NOTE — Assessment & Plan Note (Signed)
Plantar fasciitis Gout:  Will refer to podiatry - diagnosed with gout in ED - also concerned for plantar fasciitis  Will order low dose prednisone - please monitor blood sugar closely - will refill lancets and strips   PAD:  Will place referral to vascular   Follow up:  Follow up with PCP as scheduled

## 2021-06-16 ENCOUNTER — Ambulatory Visit: Payer: Self-pay

## 2021-06-16 ENCOUNTER — Encounter (INDEPENDENT_AMBULATORY_CARE_PROVIDER_SITE_OTHER): Payer: Self-pay

## 2021-06-16 MED ORDER — PREDNISONE 10 MG PO TABS: 10.0000 mg | ORAL_TABLET | Freq: Every day | ORAL | 0 refills | Status: AC

## 2021-06-16 NOTE — Addendum Note (Signed)
Addended by: Fenton Foy on: 06/16/2021 11:12 AM   Modules accepted: Orders

## 2021-06-20 ENCOUNTER — Ambulatory Visit: Payer: Self-pay | Attending: Family

## 2021-06-20 ENCOUNTER — Other Ambulatory Visit: Payer: Self-pay

## 2021-06-22 ENCOUNTER — Other Ambulatory Visit: Payer: Self-pay

## 2021-06-22 DIAGNOSIS — I739 Peripheral vascular disease, unspecified: Secondary | ICD-10-CM

## 2021-07-25 ENCOUNTER — Ambulatory Visit: Payer: Self-pay

## 2021-07-25 ENCOUNTER — Encounter (HOSPITAL_COMMUNITY): Payer: Self-pay

## 2021-09-30 ENCOUNTER — Encounter (HOSPITAL_COMMUNITY): Payer: Self-pay | Admitting: Emergency Medicine

## 2021-09-30 ENCOUNTER — Emergency Department (HOSPITAL_COMMUNITY)
Admission: EM | Admit: 2021-09-30 | Discharge: 2021-09-30 | Disposition: A | Discharge status: Home / Self Care | Payer: Self-pay | Attending: Emergency Medicine | Admitting: Emergency Medicine

## 2021-09-30 ENCOUNTER — Emergency Department (HOSPITAL_COMMUNITY): Payer: Self-pay

## 2021-09-30 DIAGNOSIS — M79672 Pain in left foot: Secondary | ICD-10-CM | POA: Insufficient documentation

## 2021-09-30 DIAGNOSIS — X58XXXA Exposure to other specified factors, initial encounter: Secondary | ICD-10-CM | POA: Insufficient documentation

## 2021-09-30 DIAGNOSIS — N189 Chronic kidney disease, unspecified: Secondary | ICD-10-CM | POA: Insufficient documentation

## 2021-09-30 DIAGNOSIS — M79671 Pain in right foot: Secondary | ICD-10-CM

## 2021-09-30 DIAGNOSIS — E1122 Type 2 diabetes mellitus with diabetic chronic kidney disease: Secondary | ICD-10-CM | POA: Insufficient documentation

## 2021-09-30 DIAGNOSIS — R52 Pain, unspecified: Secondary | ICD-10-CM

## 2021-09-30 DIAGNOSIS — I509 Heart failure, unspecified: Secondary | ICD-10-CM | POA: Insufficient documentation

## 2021-09-30 DIAGNOSIS — I130 Hypertensive heart and chronic kidney disease with heart failure and stage 1 through stage 4 chronic kidney disease, or unspecified chronic kidney disease: Secondary | ICD-10-CM | POA: Insufficient documentation

## 2021-09-30 DIAGNOSIS — S92511A Displaced fracture of proximal phalanx of right lesser toe(s), initial encounter for closed fracture: Secondary | ICD-10-CM | POA: Insufficient documentation

## 2021-09-30 DIAGNOSIS — S92531A Displaced fracture of distal phalanx of right lesser toe(s), initial encounter for closed fracture: Secondary | ICD-10-CM

## 2021-09-30 NOTE — ED Triage Notes (Signed)
Patient here for evaluation of right foot pain that started four days ago. Patient on T,TH,Sat dialysis, last treatment today. Patient is alert, oriented, and in no apparent distress at this time.

## 2021-09-30 NOTE — Progress Notes (Signed)
Orthopedic Tech Progress Note Patient Details:  Blake Santana 05/30/1971 [id number]  Ortho Devices Type of Ortho Device: Postop shoe/boot Ortho Device/Splint Location: RLE Ortho Device/Splint Interventions: Ordered, Application, Adjustment   Post Interventions Patient Tolerated: Well Instructions Provided: Care of [place] 09/30/2021, 4:22 PM

## 2021-09-30 NOTE — Discharge Instructions (Signed)
Please call the foot doctor listed to schedule a follow-up appointment in the coming week.  Llame al mdico de los pies indicado para programar una cita de seguimiento en la prxima semana.

## 2021-09-30 NOTE — ED Provider Notes (Signed)
Emergency Department Provider Note   I have reviewed the triage vital signs and the nursing notes.   HISTORY  Chief Complaint Foot Pain  Video Spanish interpreter used for this encounter.   HPI Blake Santana is a 51 y.o. male presents to the ED with 4 days of foot pain. He is having pain in the bilateral heels but is having most of his pain in the right lateral and distal foot. Denies injury. No laceration or bleeding. No rash. No fever.     Past Medical History:  Diagnosis Date   Acute respiratory failure with hypoxia ([place]) 09/07/2018   Anemia of chronic kidney failure 03/11/2017   Cataracts, bilateral 2019   Chest pain 09/01/2018   CHF (congestive heart failure) ([place])    Chronic kidney disease 03/11/2017   Diabetes mellitus without complication ([place]) 9924   Edema 01/14/2017   Headache    Hypercholesterolemia    Hypertension 03/11/2017   Hypertensive retinopathy of both eyes 2019   Iron deficiency anemia 06/18/2016   Pneumonia 09/07/2018   Proliferative diabetic retinopathy with macular edema ([place]) 2019   Retinal hemorrhage, bilateral and left vitreous hemorrhage.  Being treated with Lucentis.  Dr. Iona Hansen   RSV (respiratory syncytial virus infection) 08/2017    Review of Systems  Constitutional: No fever/chills Musculoskeletal: Right foot pain.  Skin: Negative for rash. Neurological: Negative for numbness.   ____________________________________________   PHYSICAL EXAM:  VITAL SIGNS: ED Triage Vitals [09/30/21 1322]  Enc Vitals Group     BP 140/77     Pulse Rate 74     Resp 14     Temp 98.8 F (37.1 C)     Temp Source Oral     SpO2 95 %   Constitutional: Alert and oriented. Well appearing and in no acute distress. Eyes: Conjunctivae are normal. Head: Atraumatic. Nose: No congestion/rhinnorhea. Mouth/Throat: Mucous membranes are moist.  Neck: No stridor.   Cardiovascular: Good peripheral circulation. 2+ DP pulses.  Respiratory: Normal  respiratory effort.   Gastrointestinal: No distention.  Musculoskeletal: No knee or ankle swelling/redness. Tenderness over the right little toe. No deformity. No rash or open wound. Soft compartments.  Neurologic:  Normal speech and language. Skin:  Skin is warm, dry and intact. No rash noted  ____________________________________________  RADIOLOGY  DG Foot Complete Left  Result Date: 09/30/2021 CLINICAL DATA:  Foot pain. EXAM: LEFT FOOT - COMPLETE 3+ VIEW COMPARISON:  None. FINDINGS: Small periarticular erosion along the medial border of the proximal phalanx first digit. No change from prior. Remote fracture of superior aspect of the calcaneus. No acute findings foot. Vascular calcifications noted. IMPRESSION: 1.  No fracture or dislocation. 2. Stable erosion at base of the proximal phalanx first digit. Electronically Signed   By: Suzy Bouchard M.D.   On: 09/30/2021 14:32   DG Foot Complete Right  Result Date: 09/30/2021 CLINICAL DATA:  Bilateral foot pain. EXAM: RIGHT FOOT COMPLETE - 3+ VIEW COMPARISON:  03/20/2018 FINDINGS: Diffuse vascular calcifications. Mildly displaced and comminuted fracture along the base of the little toe proximal phalanx. Fracture may extend to the MTP joint. No other fractures. IMPRESSION: Mildly displaced fracture in the little toe proximal phalanx. Difficult to exclude involving of the MTP joint. Diffuse vascular calcifications. Electronically Signed   By: Markus Daft M.D.   On: 09/30/2021 14:33    ____________________________________________   PROCEDURES  Procedure(s) performed:   Procedures  None ____________________________________________   INITIAL IMPRESSION / ASSESSMENT AND PLAN / ED COURSE  Pertinent labs & imaging results that were available during my care of the patient were reviewed by me and considered in my medical decision making (see chart for details).   This patient is Presenting for Evaluation of foot pain, which does require a  range of treatment options, and is a complaint that involves a high risk of morbidity and mortality.  The Differential Diagnoses include fracture, dislocation, cellulitis, limb ischemia, osteomyelitis.   Radiologic Tests Ordered, included x-ray of the bilateral feet. I independently interpreted the images and agree with radiology interpretation.   Medical Decision Making: Summary:  Patient presents to the ED with bilateral foot pain but mostly right little toe pain. Proximal phalanx fracture noted. Plan for post-op shoe and podiatry follow up. Contact info provided in AVS.    Reevaluation with update and discussion with patient using video interpreter. AVS info provided in Spanish.   Disposition: discharge  ____________________________________________  FINAL CLINICAL IMPRESSION(S) / ED DIAGNOSES  Final diagnoses:  Foot pain, right  Closed displaced fracture of distal phalanx of lesser toe of right foot, initial encounter      Note:  This document was prepared using Dragon voice recognition software and may include unintentional dictation errors.  Nanda Quinton, MD, [facility] Emergency Medicine    [place]tario Pettengill, [facility], MD 10/02/21 1046

## 2021-09-30 NOTE — ED Provider Triage Note (Signed)
Emergency Medicine Provider Triage Evaluation Note  Blake Santana , a 51 y.o. male  was evaluated in triage.  Pt complains of left foot pain localized to the plantar aspect of the foot near the calcaneus over the last 4 days.  Pain is worse when he gets up in the morning takes his first step.  Mildly better throughout the day but still painful.  No history of trauma or injury to the foot.  Review of Systems  Positive:  Negative: See above   Physical Exam  BP 140/77 (BP Location: Right Arm)    Pulse 74    Temp 98.8 F (37.1 C) (Oral)    Resp 14    SpO2 95%  Gen:   Awake, no distress   Resp:  Normal effort  MSK:   Moves extremities without difficulty  Other:  Point tenderness just anterior to the calcaneus at the plantar fascial insertion.  Medical Decision Making  Medically screening exam initiated at 1:50 PM.  Appropriate orders placed.  Blake Santana was informed that the remainder of the evaluation will be completed by another provider, this initial triage assessment does not replace that evaluation, and the importance of remaining in the ED until their evaluation is complete.  Suspicious for plantar fasciitis.   Myna Bright [place], [place] 09/30/21 1352

## 2021-11-17 ENCOUNTER — Inpatient Hospital Stay (HOSPITAL_COMMUNITY)
Admission: EM | Admit: 2021-11-17 | Discharge: 2021-11-27 | DRG: 492 | Disposition: A | Discharge status: Home Health | Payer: Medicaid Other | Attending: Internal Medicine | Admitting: Internal Medicine

## 2021-11-17 ENCOUNTER — Inpatient Hospital Stay (HOSPITAL_COMMUNITY): Payer: Medicaid Other

## 2021-11-17 ENCOUNTER — Emergency Department (HOSPITAL_COMMUNITY): Payer: Medicaid Other

## 2021-11-17 DIAGNOSIS — E1165 Type 2 diabetes mellitus with hyperglycemia: Secondary | ICD-10-CM | POA: Diagnosis present

## 2021-11-17 DIAGNOSIS — R748 Abnormal levels of other serum enzymes: Secondary | ICD-10-CM | POA: Diagnosis present

## 2021-11-17 DIAGNOSIS — D72828 Other elevated white blood cell count: Secondary | ICD-10-CM | POA: Diagnosis present

## 2021-11-17 DIAGNOSIS — E11311 Type 2 diabetes mellitus with unspecified diabetic retinopathy with macular edema: Secondary | ICD-10-CM | POA: Diagnosis present

## 2021-11-17 DIAGNOSIS — J9811 Atelectasis: Secondary | ICD-10-CM | POA: Diagnosis present

## 2021-11-17 DIAGNOSIS — S32592A Other specified fracture of left pubis, initial encounter for closed fracture: Secondary | ICD-10-CM | POA: Diagnosis present

## 2021-11-17 DIAGNOSIS — M79661 Pain in right lower leg: Secondary | ICD-10-CM | POA: Diagnosis present

## 2021-11-17 DIAGNOSIS — S52502A Unspecified fracture of the lower end of left radius, initial encounter for closed fracture: Secondary | ICD-10-CM | POA: Diagnosis present

## 2021-11-17 DIAGNOSIS — S82832A Other fracture of upper and lower end of left fibula, initial encounter for closed fracture: Secondary | ICD-10-CM | POA: Diagnosis present

## 2021-11-17 DIAGNOSIS — E785 Hyperlipidemia, unspecified: Secondary | ICD-10-CM | POA: Diagnosis present

## 2021-11-17 DIAGNOSIS — D631 Anemia in chronic kidney disease: Secondary | ICD-10-CM | POA: Diagnosis present

## 2021-11-17 DIAGNOSIS — S82872A Displaced pilon fracture of left tibia, initial encounter for closed fracture: Secondary | ICD-10-CM | POA: Diagnosis present

## 2021-11-17 DIAGNOSIS — S42402A Unspecified fracture of lower end of left humerus, initial encounter for closed fracture: Secondary | ICD-10-CM | POA: Diagnosis present

## 2021-11-17 DIAGNOSIS — E7800 Pure hypercholesterolemia, unspecified: Secondary | ICD-10-CM | POA: Diagnosis present

## 2021-11-17 DIAGNOSIS — I132 Hypertensive heart and chronic kidney disease with heart failure and with stage 5 chronic kidney disease, or end stage renal disease: Secondary | ICD-10-CM | POA: Diagnosis present

## 2021-11-17 DIAGNOSIS — S92511A Displaced fracture of proximal phalanx of right lesser toe(s), initial encounter for closed fracture: Secondary | ICD-10-CM | POA: Diagnosis present

## 2021-11-17 DIAGNOSIS — Y92007 Garden or yard of unspecified non-institutional (private) residence as the place of occurrence of the external cause: Secondary | ICD-10-CM

## 2021-11-17 DIAGNOSIS — S52612A Displaced fracture of left ulna styloid process, initial encounter for closed fracture: Secondary | ICD-10-CM | POA: Diagnosis present

## 2021-11-17 DIAGNOSIS — S82892A Other fracture of left lower leg, initial encounter for closed fracture: Secondary | ICD-10-CM

## 2021-11-17 DIAGNOSIS — E1129 Type 2 diabetes mellitus with other diabetic kidney complication: Secondary | ICD-10-CM | POA: Diagnosis present

## 2021-11-17 DIAGNOSIS — Z992 Dependence on renal dialysis: Secondary | ICD-10-CM

## 2021-11-17 DIAGNOSIS — S52572A Other intraarticular fracture of lower end of left radius, initial encounter for closed fracture: Secondary | ICD-10-CM | POA: Diagnosis present

## 2021-11-17 DIAGNOSIS — I959 Hypotension, unspecified: Secondary | ICD-10-CM | POA: Diagnosis present

## 2021-11-17 DIAGNOSIS — S50811A Abrasion of right forearm, initial encounter: Secondary | ICD-10-CM | POA: Diagnosis present

## 2021-11-17 DIAGNOSIS — E871 Hypo-osmolality and hyponatremia: Secondary | ICD-10-CM | POA: Diagnosis not present

## 2021-11-17 DIAGNOSIS — N186 End stage renal disease: Secondary | ICD-10-CM | POA: Diagnosis present

## 2021-11-17 DIAGNOSIS — E11649 Type 2 diabetes mellitus with hypoglycemia without coma: Secondary | ICD-10-CM | POA: Diagnosis not present

## 2021-11-17 DIAGNOSIS — Z20822 Contact with and (suspected) exposure to covid-19: Secondary | ICD-10-CM | POA: Diagnosis present

## 2021-11-17 DIAGNOSIS — W19XXXA Unspecified fall, initial encounter: Principal | ICD-10-CM

## 2021-11-17 DIAGNOSIS — Z7984 Long term (current) use of oral hypoglycemic drugs: Secondary | ICD-10-CM

## 2021-11-17 DIAGNOSIS — E1140 Type 2 diabetes mellitus with diabetic neuropathy, unspecified: Secondary | ICD-10-CM | POA: Diagnosis present

## 2021-11-17 DIAGNOSIS — S53125A Posterior dislocation of left ulnohumeral joint, initial encounter: Secondary | ICD-10-CM | POA: Diagnosis present

## 2021-11-17 DIAGNOSIS — Z833 Family history of diabetes mellitus: Secondary | ICD-10-CM

## 2021-11-17 DIAGNOSIS — Z7982 Long term (current) use of aspirin: Secondary | ICD-10-CM

## 2021-11-17 DIAGNOSIS — D62 Acute posthemorrhagic anemia: Secondary | ICD-10-CM | POA: Diagnosis not present

## 2021-11-17 DIAGNOSIS — N189 Chronic kidney disease, unspecified: Secondary | ICD-10-CM | POA: Diagnosis present

## 2021-11-17 DIAGNOSIS — E1122 Type 2 diabetes mellitus with diabetic chronic kidney disease: Secondary | ICD-10-CM | POA: Diagnosis present

## 2021-11-17 DIAGNOSIS — Z8249 Family history of ischemic heart disease and other diseases of the circulatory system: Secondary | ICD-10-CM

## 2021-11-17 DIAGNOSIS — E1159 Type 2 diabetes mellitus with other circulatory complications: Secondary | ICD-10-CM | POA: Diagnosis present

## 2021-11-17 DIAGNOSIS — S82141A Displaced bicondylar fracture of right tibia, initial encounter for closed fracture: Principal | ICD-10-CM | POA: Diagnosis present

## 2021-11-17 DIAGNOSIS — S82101A Unspecified fracture of upper end of right tibia, initial encounter for closed fracture: Secondary | ICD-10-CM | POA: Diagnosis present

## 2021-11-17 DIAGNOSIS — Z87891 Personal history of nicotine dependence: Secondary | ICD-10-CM

## 2021-11-17 DIAGNOSIS — S53105A Unspecified dislocation of left ulnohumeral joint, initial encounter: Secondary | ICD-10-CM

## 2021-11-17 DIAGNOSIS — Z23 Encounter for immunization: Secondary | ICD-10-CM

## 2021-11-17 DIAGNOSIS — Z79899 Other long term (current) drug therapy: Secondary | ICD-10-CM

## 2021-11-17 DIAGNOSIS — S82302A Unspecified fracture of lower end of left tibia, initial encounter for closed fracture: Secondary | ICD-10-CM | POA: Diagnosis present

## 2021-11-17 DIAGNOSIS — W11XXXA Fall on and from ladder, initial encounter: Secondary | ICD-10-CM | POA: Diagnosis present

## 2021-11-17 DIAGNOSIS — W208XXA Other cause of strike by thrown, projected or falling object, initial encounter: Secondary | ICD-10-CM | POA: Diagnosis present

## 2021-11-17 DIAGNOSIS — W1789XA Other fall from one level to another, initial encounter: Secondary | ICD-10-CM

## 2021-11-17 DIAGNOSIS — E1169 Type 2 diabetes mellitus with other specified complication: Secondary | ICD-10-CM | POA: Diagnosis present

## 2021-11-17 DIAGNOSIS — N2581 Secondary hyperparathyroidism of renal origin: Secondary | ICD-10-CM | POA: Diagnosis present

## 2021-11-17 DIAGNOSIS — E118 Type 2 diabetes mellitus with unspecified complications: Secondary | ICD-10-CM | POA: Diagnosis present

## 2021-11-17 DIAGNOSIS — I509 Heart failure, unspecified: Secondary | ICD-10-CM | POA: Diagnosis present

## 2021-11-17 DIAGNOSIS — I152 Hypertension secondary to endocrine disorders: Secondary | ICD-10-CM | POA: Diagnosis present

## 2021-11-17 LAB — COMPREHENSIVE METABOLIC PANEL
ALT: 16 U/L (ref 0–44)
AST: 19 U/L (ref 15–41)
Albumin: 3.6 g/dL (ref 3.5–5.0)
Alkaline Phosphatase: 289 U/L — ABNORMAL HIGH (ref 38–126)
Anion gap: 19 — ABNORMAL HIGH (ref 5–15)
BUN: 75 mg/dL — ABNORMAL HIGH (ref 6–20)
CO2: 24 mmol/L (ref 22–32)
Calcium: 9.2 mg/dL (ref 8.9–10.3)
Chloride: 93 mmol/L — ABNORMAL LOW (ref 98–111)
Creatinine, Ser: 10.78 mg/dL — ABNORMAL HIGH (ref 0.61–1.24)
GFR, Estimated: 5 mL/min — ABNORMAL LOW (ref >60)
Glucose, Bld: 229 mg/dL — ABNORMAL HIGH (ref 70–99)
Potassium: 4.8 mmol/L (ref 3.5–5.1)
Sodium: 136 mmol/L (ref 135–145)
Total Bilirubin: 0.7 mg/dL (ref 0.3–1.2)
Total Protein: 7.3 g/dL (ref 6.5–8.1)

## 2021-11-17 LAB — I-STAT CHEM 8, ED
BUN: 75 mg/dL — ABNORMAL HIGH (ref 6–20)
Calcium, Ion: 1.05 mmol/L — ABNORMAL LOW (ref 1.15–1.40)
Chloride: 98 mmol/L (ref 98–111)
Creatinine, Ser: 11.9 mg/dL — ABNORMAL HIGH (ref 0.61–1.24)
Glucose, Bld: 229 mg/dL — ABNORMAL HIGH (ref 70–99)
HCT: 33 % — ABNORMAL LOW (ref 39.0–52.0)
Hemoglobin: 11.2 g/dL — ABNORMAL LOW (ref 13.0–17.0)
Potassium: 4.7 mmol/L (ref 3.5–5.1)
Sodium: 135 mmol/L (ref 135–145)
TCO2: 28 mmol/L (ref 22–32)

## 2021-11-17 LAB — RESP PANEL BY RT-PCR (FLU A&B, COVID) ARPGX2
Influenza A by PCR: NEGATIVE
Influenza B by PCR: NEGATIVE
SARS Coronavirus 2 by RT PCR: NEGATIVE

## 2021-11-17 LAB — CBC
HCT: 32.5 % — ABNORMAL LOW (ref 39.0–52.0)
Hemoglobin: 10.7 g/dL — ABNORMAL LOW (ref 13.0–17.0)
MCH: 31.4 pg (ref 26.0–34.0)
MCHC: 32.9 g/dL (ref 30.0–36.0)
MCV: 95.3 fL (ref 80.0–100.0)
Platelets: 172 10*3/uL (ref 150–400)
RBC: 3.41 MIL/uL — ABNORMAL LOW (ref 4.22–5.81)
RDW: 14.1 % (ref 11.5–15.5)
WBC: 12.2 10*3/uL — ABNORMAL HIGH (ref 4.0–10.5)
nRBC: 0 % (ref 0.0–0.2)

## 2021-11-17 LAB — PROTIME-INR
INR: 1.1 (ref 0.8–1.2)
Prothrombin Time: 14.1 seconds (ref 11.4–15.2)

## 2021-11-17 LAB — SAMPLE TO BLOOD BANK

## 2021-11-17 LAB — ETHANOL: Alcohol, Ethyl (B): <10 mg/dL (ref <10)

## 2021-11-17 LAB — CBG MONITORING, ED: Glucose-Capillary: 229 mg/dL — ABNORMAL HIGH (ref 70–99)

## 2021-11-17 LAB — LACTIC ACID, PLASMA: Lactic Acid, Venous: 1.2 mmol/L (ref 0.5–1.9)

## 2021-11-17 LAB — TROPONIN I (HIGH SENSITIVITY): Troponin I (High Sensitivity): 30 ng/L — ABNORMAL HIGH (ref <18)

## 2021-11-17 MED ORDER — IOHEXOL 300 MG/ML  SOLN
100.0000 mL | Freq: Once | INTRAMUSCULAR | Status: AC | PRN
  Administered 2021-11-17: 100 mL via INTRAVENOUS

## 2021-11-17 MED ORDER — FENTANYL CITRATE PF 50 MCG/ML IJ SOSY
50.0000 ug | PREFILLED_SYRINGE | INTRAMUSCULAR | Status: AC | PRN
  Administered 2021-11-17 (×2): 50 ug via INTRAVENOUS
  Filled 2021-11-17 (×2): qty 1

## 2021-11-17 MED ORDER — HYDROMORPHONE HCL 1 MG/ML IJ SOLN
1.0000 mg | INTRAMUSCULAR | Status: DC | PRN
  Administered 2021-11-18 – 2021-11-20 (×9): 1 mg via INTRAVENOUS
  Filled 2021-11-17 (×10): qty 1

## 2021-11-17 MED ORDER — FENTANYL CITRATE PF 50 MCG/ML IJ SOSY
50.0000 ug | PREFILLED_SYRINGE | Freq: Once | INTRAMUSCULAR | Status: AC
  Administered 2021-11-17: 50 ug via INTRAVENOUS
  Filled 2021-11-17: qty 1

## 2021-11-17 MED ORDER — ONDANSETRON HCL 4 MG/2ML IJ SOLN
INTRAMUSCULAR | Status: AC
  Filled 2021-11-17: qty 2

## 2021-11-17 MED ORDER — INSULIN ASPART 100 UNIT/ML IJ SOLN
0.0000 [IU] | INTRAMUSCULAR | Status: DC
  Administered 2021-11-18 (×2): 1 [IU] via SUBCUTANEOUS
  Administered 2021-11-18: 2 [IU] via SUBCUTANEOUS
  Administered 2021-11-19 (×3): 1 [IU] via SUBCUTANEOUS
  Administered 2021-11-19: 2 [IU] via SUBCUTANEOUS
  Administered 2021-11-20: 1 [IU] via SUBCUTANEOUS
  Administered 2021-11-21: 2 [IU] via SUBCUTANEOUS
  Administered 2021-11-21 (×2): 1 [IU] via SUBCUTANEOUS
  Administered 2021-11-21: 3 [IU] via SUBCUTANEOUS
  Administered 2021-11-21: 4 [IU] via SUBCUTANEOUS
  Administered 2021-11-21 – 2021-11-22 (×2): 2 [IU] via SUBCUTANEOUS
  Administered 2021-11-22 – 2021-11-23 (×3): 1 [IU] via SUBCUTANEOUS

## 2021-11-17 MED ORDER — ONDANSETRON HCL 4 MG/2ML IJ SOLN
4.0000 mg | Freq: Once | INTRAMUSCULAR | Status: AC
  Administered 2021-11-17: 4 mg via INTRAVENOUS
  Filled 2021-11-17: qty 2

## 2021-11-17 MED ORDER — HYDROCODONE-ACETAMINOPHEN 5-325 MG PO TABS
1.0000 | ORAL_TABLET | Freq: Four times a day (QID) | ORAL | Status: DC | PRN
  Administered 2021-11-18: 2 via ORAL
  Filled 2021-11-17: qty 2

## 2021-11-17 MED ORDER — PROPOFOL 10 MG/ML IV BOLUS
1.0000 mg/kg | Freq: Once | INTRAVENOUS | Status: AC
  Administered 2021-11-17: 64 mg via INTRAVENOUS
  Filled 2021-11-17: qty 20

## 2021-11-17 MED ORDER — PROCHLORPERAZINE EDISYLATE 10 MG/2ML IJ SOLN: 10.0000 mg | Freq: Four times a day (QID) | INTRAMUSCULAR | Status: DC | PRN

## 2021-11-17 MED ORDER — LACTATED RINGERS IV BOLUS
1000.0000 mL | Freq: Once | INTRAVENOUS | Status: AC
Start: 2021-11-17 — End: 2021-11-18
  Administered 2021-11-17: 1000 mL via INTRAVENOUS

## 2021-11-17 MED ORDER — TETANUS-DIPHTH-ACELL PERTUSSIS 5-2.5-18.5 LF-MCG/0.5 IM SUSY
0.5000 mL | PREFILLED_SYRINGE | Freq: Once | INTRAMUSCULAR | Status: AC
  Administered 2021-11-17: 0.5 mL via INTRAMUSCULAR
  Filled 2021-11-17: qty 0.5

## 2021-11-17 MED ORDER — SENNOSIDES-DOCUSATE SODIUM 8.6-50 MG PO TABS: 1.0000 | ORAL_TABLET | Freq: Every evening | ORAL | Status: DC | PRN

## 2021-11-17 NOTE — Assessment & Plan Note (Signed)
Hold statin for now. ?

## 2021-11-17 NOTE — Assessment & Plan Note (Signed)
No emergent need for HD on admission.  Nephrology notified for routine dialysis needs while in hospital. ?

## 2021-11-17 NOTE — Assessment & Plan Note (Signed)
Place on very sensitive SSI every 4 hours and adjust as needed. ?

## 2021-11-17 NOTE — ED Notes (Signed)
Patient transported to X-ray at bedside 

## 2021-11-17 NOTE — H&P (Signed)
?History and Physical  ? ? Blake Santana YYT:[id number] DOB: [birth year] DOA: 11/17/2021 ? ?PCP: [name], NP  ?Patient coming from: Home via EMS ? ?I have personally briefly reviewed patient's old medical records in [place] ? ?Chief Complaint: Fall from 20 foot height ? ?HPI: ?Blake Santana is a Spanish-speaking 51 y.o. male with medical history significant for ESRD on TTS HD, T2DM with diabetic retinopathy, anemia of ESRD, HTN, HLD, PAD who presented to the ED for trauma evaluation after he fell from a 20 foot height.  Remote video interpreter is used to facilitate communication. ? ?Patient states that he was cutting tree branches while standing on a ladder at his house when a branch hit him in the chest and he fell about 20 feet.  EMS were called and per ED documentation he had notable deformity to his left shoulder and left ankle.  He was placed on c-collar and given 100 mcg of fentanyl then brought to the ED for further evaluation. ? ?Patient reports continued uncontrolled pain.  He states that his usual dialysis days are Tuesday, Thursday, Saturday. ? ?ED Course  Labs/Imaging on admission: I have personally reviewed following labs and imaging studies. ? ?Initial vitals showed BP 222/118, pulse 85, RR 18, temp 97.8 ?F, SPO2 100% on 3 L O2 via [place]. ? ?Labs show sodium 136, potassium 4.8, bicarb 24, BUN 75, creatinine 10.78, serum glucose 229, AST 19, ALT 16, alk phos 289, total bilirubin 0.7, WBC 12.2, hemoglobin 10.7, platelets 172,000, serum ethanol <10, lactic acid 1.2, INR 1.1, troponin 30. ? ?SARS-CoV-2 and influenza PCR negative. ? ?Extensive traumatic imaging notable for: ?Acute comminuted left inferior pubic ramus fracture ?Acute comminuted fracture of the left distal fibula and tibia with extension to the medial malleolus; possible left calcaneal fracture ?Acute comminuted fracture left distal radius ?Mildly displaced fracture of the base of proximal phalanx of right  fifth toe ?Possible proximal right tibial fracture ?Posterior dislocation of the left elbow ?Bilateral lung atelectasis ?Negative for acute fracture or traumatic subluxation of the C-spine ?Negative for acute intracranial abnormality ? ?Patient was given 1 L LR.  Left elbow dislocation was reduced under sedation in the ED, follow-up x-ray shows anatomic alignment on single view.  EDP discussed with trauma surgery and orthopedics.  Medical admission was requested.  Orthopedics will see in consultation for possible surgical intervention.  The hospitalist service was consulted to admit for further evaluation and management. ? ?Review of Systems: All systems reviewed and are negative except as documented in history of present illness above. ? ? ?Past Medical History:  ?Diagnosis Date  ? Acute respiratory failure with hypoxia ([place]) 09/07/2018  ? Anemia of chronic kidney failure 03/11/2017  ? Cataracts, bilateral 2019  ? Chest pain 09/01/2018  ? CHF (congestive heart failure) ([place])   ? Chronic kidney disease 03/11/2017  ? Diabetes mellitus without complication ([place]) 2751  ? Edema 01/14/2017  ? Headache   ? Hypercholesterolemia   ? Hypertension 03/11/2017  ? Hypertensive retinopathy of both eyes 2019  ? Iron deficiency anemia 06/18/2016  ? Pneumonia 09/07/2018  ? Proliferative diabetic retinopathy with macular edema ([employer]) 2019  ? Retinal hemorrhage, bilateral and left vitreous hemorrhage.  Being treated with Lucentis.  Dr. Iona Hansen  ? RSV (respiratory syncytial virus infection) 08/2017  ? ? ?Past Surgical History:  ?Procedure Laterality Date  ? A/V FISTULAGRAM Left 01/19/2018  ? Procedure: A/V FISTULAGRAM - Left AV;  Surgeon: Waynetta Sandy, MD;  Location: [facility];  Service: Cardiovascular;  Laterality: Left;  ? AV FISTULA PLACEMENT Left 08/09/2017  ? Procedure: LEFT RADIOCEPHALIC ARTERIOVENOUS (AV) FISTULA CREATION;  Surgeon: [name], MD;  [facility];  Service: Vascular;  Laterality: Left;  ? AV  FISTULA PLACEMENT Left 02/02/2018  ? Procedure: ARTERIOVENOUS (AV) FISTULA CREATION LEFT UPPER EXTREMITY;  Surgeon: [name], MD;  Location: [facility];  Service: Vascular;  Laterality: Left;  ? INSERTION OF DIALYSIS CATHETER  08/09/2017  ? INSERTION OF DIALYSIS CATHETER Right 08/09/2017  ? Procedure: INSERTION OF DIALYSIS CATHETER RIGHT INTERNAL JUGULAR;  Surgeon: [name], MD;  [facility];  Service: Vascular;  Laterality: Right;  ? IR DIALY SHUNT INTRO NEEDLE/INTRACATH INITIAL W/IMG LEFT Left 07/10/2020  ? IR DIALY SHUNT INTRO NEEDLE/INTRACATH INITIAL W/IMG LEFT Left 04/14/2021  ? IR REMOVAL TUN CV CATH W/O FL  07/01/2018  ? LEFT HEART CATH AND CORONARY ANGIOGRAPHY N/A 09/05/2018  ? Procedure: LEFT HEART CATH AND CORONARY ANGIOGRAPHY;  Surgeon: [name], MD;  Location: [facility];  Service: Cardiovascular;  Laterality: N/A;  ? ? ?Social History: ? reports that he has quit smoking. His smoking use included cigarettes. He has never used smokeless tobacco. He reports that he does not currently use alcohol. He reports that he does not use drugs. ? ?No Known Allergies ? ?Family History  ?Problem Relation Age of Onset  ? Diabetes Mother   ? Kidney disease Mother   ?     renal failure--diabetes, cause of death  ? Hypertension Mother   ? Diabetes Father   ? Kidney disease Father   ?     Cause of death:  kidney failure form diabetes  ? Alcohol abuse Father   ? Diabetes Sister   ? ? ? ?Prior to Admission medications   ?Medication Sig Start Date End Date Taking? Authorizing Provider  ?acetaminophen (TYLENOL) 500 MG tablet Take 1,000 mg by mouth every 6 (six) hours as needed for moderate pain or headache.    [provider]  ?AgaMatrix Ultra-Thin Lancets MISC Check blood glucose twice daily before meals. 06/09/21   Fenton Foy, NP  ?amLODipine (NORVASC) 5 MG tablet Take 1 tablet by mouth once daily with breakfast ?Patient taking differently: Take 5 mg by mouth daily. 10/18/19   Mack Hook, MD  ?aspirin EC 81 MG EC tablet Take 1 tablet (81 mg total) by mouth daily. ?Patient not taking: Reported on 04/11/2021 09/08/18   Mina Marble P, DO  ?b complex-vitamin c-folic acid (NEPHRO-VITE) 0.8 MG TABS tablet Take 1 tablet by mouth Every Tuesday,Thursday,and Saturday with dialysis.  08/11/18   [provider]  ?Blood Glucose Monitoring Suppl (AGAMATRIX PRESTO) w/Device KIT Check sugars twice daily 06/04/16   Mack Hook, MD  ?cinacalcet (SENSIPAR) 60 MG tablet Take 60 mg by mouth daily.    [provider]  ?colchicine 0.6 MG tablet Take 1 tablet (0.6 mg total) by mouth daily for 1 day. 06/04/21 06/05/21  Redwine, Madison A, PA-C  ?glipiZIDE (GLUCOTROL) 10 MG tablet Take 1 tablet (10 mg total) by mouth 2 (two) times daily before a meal. 01/16/21 04/16/21  [name], NP  ?glucose blood (AGAMATRIX PRESTO TEST) test strip Check sugars twice daily 06/09/21   Fenton Foy, NP  ?guaiFENesin (MUCINEX) 600 MG 12 hr tablet Take 600 mg by mouth 2 (two) times daily as needed for cough.    [provider]  ?guaifenesin (ROBITUSSIN) 100 MG/5ML syrup Take 200  mg by mouth 3 (three) times daily as needed for cough.    [provider]  ?lanthanum (FOSRENOL) 1000 MG chewable tablet Chew 2,000 mg by mouth 3 (three) times daily with meals.    [provider]  ?lidocaine-prilocaine (EMLA) cream Apply 1 application topically 3 (three) times a week. Use at dialysis 02/17/21   [provider]  ?loperamide (IMODIUM A-D) 2 MG tablet Take 2 mg by mouth 4 (four) times daily as needed for diarrhea or loose stools.    [provider]  ?metoprolol tartrate (LOPRESSOR) 25 MG tablet TAKE 1/2 (ONE-HALF) TABLET BY MOUTH TWICE DAILY WITH MEALS ?Patient taking differently: Take 12.5 mg by mouth 2 (two) times daily. 10/18/19   Mack Hook, MD  ?rosuvastatin (CRESTOR) 20 MG tablet Take 1 tablet (20 mg total) by mouth daily at 6 PM. 01/16/21 04/16/21  [name], NP  ?sucroferric oxyhydroxide (VELPHORO) 500 MG chewable tablet Chew 2 tablets (1,000 mg total) by mouth 3 (three) times daily with meals. ?Patient not taking: Reported on 04/11/2021 10/18/19   Ella Bodo

## 2021-11-17 NOTE — ED Notes (Signed)
Xr remains at bedside  ?

## 2021-11-17 NOTE — Assessment & Plan Note (Signed)
Hemoglobin stable at 10.7.  Continue to monitor. ?

## 2021-11-17 NOTE — Progress Notes (Signed)
Orthopedic Tech Progress Note ?Patient Details:  ?Thornell Mule Acevedo-Ocejo ?1971/06/01 ?[id number] ? ?Level 2 trauma, not needed at this second  ? ?Patient ID: Izell Labat, male   DOB: Sep 24, 1970, 51 y.o.   MRN: [id number] ? ?Janit Pagan ?11/17/2021, 5:12 PM ? ?

## 2021-11-17 NOTE — ED Notes (Signed)
Trauma Response Nurse Documentation ? ? ?Blake Santana is a 51 y.o. male arriving to [facility] ED via EMS ? ?On  no antithombotic . Trauma was activated as a Level 2 by ED Charge RN based on the following trauma criteria Falls > 20 ft. with adults, >10 ft. with children (<15). Trauma team at the bedside on patient arrival. Patient cleared for CT by Dr. [name]. Patient to CT with team. GCS 15. ? ?History  ? Past Medical History:  ?Diagnosis Date  ? Acute respiratory failure with hypoxia ([place]) 09/07/2018  ? Anemia of chronic kidney failure 03/11/2017  ? Cataracts, bilateral 2019  ? Chest pain 09/01/2018  ? CHF (congestive heart failure) ([place])   ? Chronic kidney disease 03/11/2017  ? Diabetes mellitus without complication ([place]) 5681  ? Edema 01/14/2017  ? Headache   ? Hypercholesterolemia   ? Hypertension 03/11/2017  ? Hypertensive retinopathy of both eyes 2019  ? Iron deficiency anemia 06/18/2016  ? Pneumonia 09/07/2018  ? Proliferative diabetic retinopathy with macular edema ([place]) 2019  ? Retinal hemorrhage, bilateral and left vitreous hemorrhage.  Being treated with Lucentis.  Dr. Iona Hansen  ? RSV (respiratory syncytial virus infection) 08/2017  ?  ? Past Surgical History:  ?Procedure Laterality Date  ? A/V FISTULAGRAM Left 01/19/2018  ? Procedure: A/V FISTULAGRAM - Left AV;  Surgeon: Waynetta Sandy, MD;  Location: [facility];  Service: Cardiovascular;  Laterality: Left;  ? AV FISTULA PLACEMENT Left 08/09/2017  ? Procedure: LEFT RADIOCEPHALIC ARTERIOVENOUS (AV) FISTULA CREATION;  Surgeon: [name], MD;  [facility];  Service: Vascular;  Laterality: Left;  ? AV FISTULA PLACEMENT Left 02/02/2018  ? Procedure: ARTERIOVENOUS (AV) FISTULA CREATION LEFT UPPER EXTREMITY;  Surgeon: [name], MD;  Location: [facility];  Service: Vascular;  Laterality: Left;  ? INSERTION OF DIALYSIS CATHETER  08/09/2017  ? INSERTION OF DIALYSIS CATHETER Right 08/09/2017  ? Procedure: INSERTION OF DIALYSIS CATHETER RIGHT  INTERNAL JUGULAR;  Surgeon: [name], MD;  [facility];  Service: Vascular;  Laterality: Right;  ? IR DIALY SHUNT INTRO NEEDLE/INTRACATH INITIAL W/IMG LEFT Left 07/10/2020  ? IR DIALY SHUNT INTRO NEEDLE/INTRACATH INITIAL W/IMG LEFT Left 04/14/2021  ? IR REMOVAL TUN CV CATH W/O FL  07/01/2018  ? LEFT HEART CATH AND CORONARY ANGIOGRAPHY N/A 09/05/2018  ? Procedure: LEFT HEART CATH AND CORONARY ANGIOGRAPHY;  Surgeon: [name], MD;  Location: [facility];  Service: Cardiovascular;  Laterality: N/A;  ?  ? ? ? ?Initial Focused Assessment (If applicable, or please see trauma documentation): ?- GCS 15 ?- PERRLA ?- Manual BP 222/118 ?- C-collar in place ?- 18G PIV to R AC ?- fistula to L arm ?- L shoulder pain - splint in place ?- L leg pain - splint in place ?- R ankle pain ?- pt actively vomiting ? ?CT's Completed:   ?CT Head, CT C-Spine, CT Chest w/ contrast, and CT abdomen/pelvis w/ contrast  ? ?Interventions:  ?- 2nd PIV established to R [facility] beside the other one ?- trauma labs ?- suctioned mouth due to vomit ?- '4mg'$  zofran given ?- fentanyl given ?- CXR ?- Pelvic XR ?- CT pan scan ?- Multiple extremity Xrays ?- pt logrolled ? ?Plan for disposition:  ?Other Awaiting scan results ? ?Consults completed:  ?none at 1745. ? ?Event Summary: ?Pt arrives to ED BIB [facility] as a Level 2 Fall. Per EMS pt was on a ladder cutting branches and was hit by  a branch on the chest and fell 20 feet. Deformity to left shoulder and left ankle. EMS gave 135mg of fentanyl. Abrasion to RT FA. Pt wearing C-Collar. ? ?MTP Summary (If applicable): n/a ? ?Bedside handoff with ED RN AHennie Duos   ? ?WDulcy FannyW  ?Trauma Response RN ? ?Please call TRN at [phone] further assistance. ? ? ?

## 2021-11-17 NOTE — Assessment & Plan Note (Addendum)
Acute comminuted fracture of the left distal fibula and tibia with extension to the medial malleolus; possible left calcaneal fracture ?Acute comminuted fracture left distal radius ?Mildly displaced fracture of the base of proximal phalanx of right fifth toe ?Possible proximal right tibial fracture ?Posterior dislocation of the left elbow, s/p reduction in the ED ?Traumatic fall from 20 foot height ?Orthopedics consulted, they have ordered follow-up CT right knee and left ankle. ?-Keep n.p.o., hold chemical VTE prophylaxis for now ?-Continue analgesics as needed ?-Further care per orthopedics ?

## 2021-11-17 NOTE — ED Triage Notes (Signed)
Pt arrives to ED BIB [facility] as a Level 2 Fall. Per EMS pt was on a ladder cutting branches and was hit by a branch on the chest and fell 20 feet. Deformity to left shoulder and left ankle. EMS gave 120mg of fentanyl. Abrasion to RT FA. Pt wearing C-Collar. ?

## 2021-11-17 NOTE — Progress Notes (Signed)
Orthopedic Tech Progress Note ?Patient Details:  ?Blake Santana ?04-Dec-1970 ?[id number] ? ?Ortho Devices ?Type of Ortho Device: Post (long arm) splint, Post (short leg) splint, Stirrup splint, Arm sling ?Ortho Device/Splint Location: lue post long arm splint, lle posterior short leg with stirrup. ?Ortho Device/Splint Interventions: Ordered, Application, Adjustment ? I assisted with reduction then applied splint post reduction at drs request. ?Post Interventions ?Patient Tolerated: Well ?Instructions Provided: Care of device, Adjustment of device ? ?Karolee Stamps ?11/17/2021, 10:20 PM ? ?

## 2021-11-17 NOTE — Progress Notes (Signed)
Radiographic studies reviewed.  Patient may have a diet tonight.  Will make NPO after midnight.  Will see patient for full consult in the morning.  Appreciate admission to medicine.   ? ?N. Eduard Roux, MD ?Marga Hoots ?10:26 PM ? ? ? ?

## 2021-11-17 NOTE — Assessment & Plan Note (Signed)
BP elevated in setting of uncontrolled pain from multiple skeletal fractures.  Continue amlodipine, Lopressor, analgesics as needed. ?

## 2021-11-17 NOTE — ED Provider Notes (Signed)
?[name] ?Provider Note ? ? ?CSN: [id number] ?Arrival date & time: 11/17/21  1634 ? ?  ? ?History ? ?Chief Complaint  ?Patient presents with  ? Trauma  ?  Fall 20 Foot Ladder   ? ? ?Blake Santana is a 51 y.o. male. ? ?HPI ? ?51 year old male with medical history significant for DM 2, ESRD on dialysis Tuesday, Thursday, Saturday, HLD, diabetic retinopathy, CHF presenting to the emergency department after a fall as a level 2 trauma.  This was provided by EMS who states that the patient was on a ladder cutting branches when he was hit by a branch on the chest and subsequently felt 20 feet.  The patient landed on the right side of his body.  He sustained an injury to his left elbow, possible injury to his left shoulder, left wrist, left knee, left ankle.  He also complains of pain in his right ankle, right foot and right knee.  He also complained of pelvic pain.  C-collar was placed by EMS and the patient was brought to the emergency department where he arrived GCS 15, ABC intact. ? ?Home Medications ?Prior to Admission medications   ?Medication Sig Start Date End Date Taking? Authorizing Provider  ?acetaminophen (TYLENOL) 500 MG tablet Take 1,000 mg by mouth every 6 (six) hours as needed for moderate pain or headache.    [provider]  ?AgaMatrix Ultra-Thin Lancets MISC Check blood glucose twice daily before meals. 06/09/21   Fenton Foy, NP  ?amLODipine (NORVASC) 5 MG tablet Take 1 tablet by mouth once daily with breakfast ?Patient taking differently: Take 5 mg by mouth daily. 10/18/19   Mack Hook, MD  ?aspirin EC 81 MG EC tablet Take 1 tablet (81 mg total) by mouth daily. ?Patient not taking: Reported on 04/11/2021 09/08/18   Mina Marble P, DO  ?b complex-vitamin c-folic acid (NEPHRO-VITE) 0.8 MG TABS tablet Take 1 tablet by mouth Every Tuesday,Thursday,and Saturday with dialysis.  08/11/18   [provider]  ?Blood Glucose Monitoring  Suppl (AGAMATRIX PRESTO) w/Device KIT Check sugars twice daily 06/04/16   Mack Hook, MD  ?cinacalcet (SENSIPAR) 60 MG tablet Take 60 mg by mouth daily.    [provider]  ?colchicine 0.6 MG tablet Take 1 tablet (0.6 mg total) by mouth daily for 1 day. 06/04/21 06/05/21  Redwine, Madison A, PA-C  ?glipiZIDE (GLUCOTROL) 10 MG tablet Take 1 tablet (10 mg total) by mouth 2 (two) times daily before a meal. 01/16/21 04/16/21  [name], NP  ?glucose blood (AGAMATRIX PRESTO TEST) test strip Check sugars twice daily 06/09/21   Fenton Foy, NP  ?guaiFENesin (MUCINEX) 600 MG 12 hr tablet Take 600 mg by mouth 2 (two) times daily as needed for cough.    [provider]  ?guaifenesin (ROBITUSSIN) 100 MG/5ML syrup Take 200 mg by mouth 3 (three) times daily as needed for cough.    [provider]  ?lanthanum (FOSRENOL) 1000 MG chewable tablet Chew 2,000 mg by mouth 3 (three) times daily with meals.    [provider]  ?lidocaine-prilocaine (EMLA) cream Apply 1 application topically 3 (three) times a week. Use at dialysis 02/17/21   [provider]  ?loperamide (IMODIUM A-D) 2 MG tablet Take 2 mg by mouth 4 (four) times daily as needed for diarrhea or loose stools.    [provider]  ?metoprolol tartrate (LOPRESSOR) 25 MG tablet TAKE 1/2 (ONE-HALF) TABLET BY MOUTH TWICE DAILY WITH MEALS ?  Patient taking differently: Take 12.5 mg by mouth 2 (two) times daily. 10/18/19   Mack Hook, MD  ?rosuvastatin (CRESTOR) 20 MG tablet Take 1 tablet (20 mg total) by mouth daily at 6 PM. 01/16/21 04/16/21  [name], NP  ?sucroferric oxyhydroxide (VELPHORO) 500 MG chewable tablet Chew 2 tablets (1,000 mg total) by mouth 3 (three) times daily with meals. ?Patient not taking: Reported on 04/11/2021 10/18/19   Mack Hook, MD  ?   ? ?Allergies    ?Patient has no known allergies.   ? ?Review of Systems   ?Review of Systems  ?Unable to perform ROS: Acuity of condition   ? ?Physical Exam ?Updated Vital Signs ?BP (!) 208/96   Pulse 92   Temp 97.8 ?F (36.6 ?C) (Temporal)   Resp 15   Ht _0  (1.676 m)   Wt 64 kg   SpO2 100%   BMI 22.77 kg/m?  ?Physical Exam ?Vitals and nursing note reviewed.  ?Constitutional:   ?   Appearance: He is well-developed.  ?   Comments: GCS 15, ABC intact  ?HENT:  ?   Head: Normocephalic.  ?Eyes:  ?   Conjunctiva/sclera: Conjunctivae normal.  ?Neck:  ?   Comments: No midline tenderness to palpation of the cervical spine. ROM intact. ?Cardiovascular:  ?   Rate and Rhythm: Normal rate and regular rhythm.  ?   Heart sounds: No murmur heard. ?Pulmonary:  ?   Effort: Pulmonary effort is normal. No respiratory distress.  ?   Breath sounds: Normal breath sounds.  ?Chest:  ?   Comments: Chest wall stable and non-tender to AP and lateral compression. Clavicles stable and non-tender to AP compression ?Abdominal:  ?   Palpations: Abdomen is soft.  ?   Tenderness: There is no abdominal tenderness.  ?   Comments: Pelvis stable to lateral compression, tender to palpation on the left  ?Musculoskeletal:  ?   Cervical back: Neck supple.  ?   Comments: No midline tenderness to palpation of the thoracic or lumbar spine.  Multiple signs of multiple extremity injuries.  Left wrist with mild deformity, tenderness to palpation bilaterally, 2+ DP pulses with intact motor function along the median, ulnar, radial nerve distributions.  Deformity and tenderness of the left elbow.  Pain along the left shoulder.  Patient in sling placed by EMS.  Tenderness palpation of the bilateral knees with pain at any attempts at range of motion.  Distal DP pulses intact, deformity and tenderness to palpation of the left ankle bilaterally.  ?Skin: ?   General: Skin is warm and dry.  ?   Comments: Forearm abrasion  ?Neurological:  ?   Mental Status: He is alert.  ?   Comments: CN II-XII grossly intact. Moving all four extremities spontaneously and sensation grossly intact.  ? ? ?ED Results /  Procedures / Treatments   ?Labs ?(all labs ordered are listed, but only abnormal results are displayed) ?Labs Reviewed  ?COMPREHENSIVE METABOLIC PANEL - Abnormal; Notable for the following components:  ?    Result Value  ? Chloride 93 (*)   ? Glucose, Bld 229 (*)   ? BUN 75 (*)   ? Creatinine, Ser 10.78 (*)   ? Alkaline Phosphatase 289 (*)   ? GFR, Estimated 5 (*)   ? Anion gap 19 (*)   ? All other components within normal limits  ?CBC - Abnormal; Notable for the following components:  ? WBC 12.2 (*)   ? RBC 3.41 (*)   ?  Hemoglobin 10.7 (*)   ? HCT 32.5 (*)   ? All other components within normal limits  ?I-STAT CHEM 8, ED - Abnormal; Notable for the following components:  ? BUN 75 (*)   ? Creatinine, Ser 11.90 (*)   ? Glucose, Bld 229 (*)   ? Calcium, Ion 1.05 (*)   ? Hemoglobin 11.2 (*)   ? HCT 33.0 (*)   ? All other components within normal limits  ?TROPONIN I (HIGH SENSITIVITY) - Abnormal; Notable for the following components:  ? Troponin I (High Sensitivity) 30 (*)   ? All other components within normal limits  ?RESP PANEL BY RT-PCR (FLU A&B, COVID) ARPGX2  ?RESP PANEL BY RT-PCR (FLU A&B, COVID) ARPGX2  ?ETHANOL  ?LACTIC ACID, PLASMA  ?PROTIME-INR  ?URINALYSIS, ROUTINE W REFLEX MICROSCOPIC  ?URINALYSIS, ROUTINE W REFLEX MICROSCOPIC  ?LACTIC ACID, PLASMA  ?HIV ANTIBODY (ROUTINE TESTING W REFLEX)  ?CBC  ?BASIC METABOLIC PANEL  ?HEMOGLOBIN A1C  ?I-STAT CHEM 8, ED  ?SAMPLE TO BLOOD BANK  ?TYPE AND SCREEN  ? ? ?EKG ?EKG Interpretation ? ?Date/Time:  Monday November 17 2021 20:39:16 EDT ?Ventricular Rate:  83 ?PR Interval:  147 ?QRS Duration: 94 ?QT Interval:  417 ?QTC Calculation: 490 ?R Axis:   106 ?Text Interpretation: Sinus rhythm Right atrial enlargement Abnormal R-wave progression, late transition Consider left ventricular hypertrophy Nonspecific ST segment changes present Borderline prolonged QT interval No significant change since last tracing Reconfirmed by Regan Lemming ([id number]) on 11/17/2021 11:00:08  PM ? ?Radiology ?DG Elbow 2 Views Left ? ?Result Date: 11/17/2021 ?CLINICAL DATA:  Left elbow dislocation.  Post reduction imaging. EXAM: LEFT ELBOW - 2 VIEW COMPARISON:  8:28 p.m., 5:52 p.m. FINDINGS: Single-view front

## 2021-11-17 NOTE — ED Provider Notes (Signed)
?  Procedures ?Reduction of dislocation ? ?Date/Time: 11/17/2021 8:34 PM ?Performed by: [name], MD ?Authorized by: Regan Lemming, MD  ?Consent: Written consent obtained. ?Consent given by: patient ?Patient understanding: patient states understanding of the procedure being performed ?Patient consent: the patient's understanding of the procedure matches consent given ?Procedure consent: procedure consent matches procedure scheduled ?Patient identity confirmed: arm band ?Time out: Immediately prior to procedure a "time out" was called to verify the correct patient, procedure, equipment, support staff and site/side marked as required. ? ?Sedation: ?Patient sedated: yes ?Sedatives: propofol ?Analgesia: fentanyl ? ?Patient tolerance: patient tolerated the procedure well with no immediate complications ?Comments: Location details: Left elbow ?Reduction Procedure: gentle flexion/supination followed by extension ?Results: After procedure, the patient was able to move the arm with improved range of motion.  Patient continued to be neurovascularly intact before and after procedure.  Post reduction imaging in appropriate spot. ?Complication: Tolerated well without complication.  ? ? ?  ?  ?[name], MD ?11/17/21 2037 ? ?  ?Regan Lemming, MD ?11/17/21 2320 ? ?

## 2021-11-17 NOTE — Hospital Course (Signed)
Blake Santana is a Spanish-speaking 51 y.o. male with medical history significant for ESRD on TTS HD, T2DM with diabetic retinopathy, anemia of ESRD, HTN, HLD, PAD who is admitted with multiple skeletal fractures after fall from 20 foot height.  Orthopedics consulting for further management. ?

## 2021-11-18 ENCOUNTER — Encounter (HOSPITAL_COMMUNITY): Payer: Self-pay | Admitting: Internal Medicine

## 2021-11-18 ENCOUNTER — Inpatient Hospital Stay (HOSPITAL_COMMUNITY): Payer: Medicaid Other

## 2021-11-18 ENCOUNTER — Other Ambulatory Visit: Payer: Self-pay

## 2021-11-18 DIAGNOSIS — S92511A Displaced fracture of proximal phalanx of right lesser toe(s), initial encounter for closed fracture: Secondary | ICD-10-CM

## 2021-11-18 DIAGNOSIS — S82892A Other fracture of left lower leg, initial encounter for closed fracture: Secondary | ICD-10-CM

## 2021-11-18 DIAGNOSIS — W19XXXA Unspecified fall, initial encounter: Secondary | ICD-10-CM

## 2021-11-18 LAB — CBC
HCT: 26.1 % — ABNORMAL LOW (ref 39.0–52.0)
Hemoglobin: 8.4 g/dL — ABNORMAL LOW (ref 13.0–17.0)
MCH: 31.3 pg (ref 26.0–34.0)
MCHC: 32.2 g/dL (ref 30.0–36.0)
MCV: 97.4 fL (ref 80.0–100.0)
Platelets: 154 10*3/uL (ref 150–400)
RBC: 2.68 MIL/uL — ABNORMAL LOW (ref 4.22–5.81)
RDW: 14.5 % (ref 11.5–15.5)
WBC: 10.7 10*3/uL — ABNORMAL HIGH (ref 4.0–10.5)
nRBC: 0 % (ref 0.0–0.2)

## 2021-11-18 LAB — HEPATITIS B SURFACE ANTIGEN: Hepatitis B Surface Ag: NONREACTIVE

## 2021-11-18 LAB — BASIC METABOLIC PANEL
Anion gap: 18 — ABNORMAL HIGH (ref 5–15)
BUN: 83 mg/dL — ABNORMAL HIGH (ref 6–20)
CO2: 23 mmol/L (ref 22–32)
Calcium: 8.6 mg/dL — ABNORMAL LOW (ref 8.9–10.3)
Chloride: 93 mmol/L — ABNORMAL LOW (ref 98–111)
Creatinine, Ser: 11.33 mg/dL — ABNORMAL HIGH (ref 0.61–1.24)
GFR, Estimated: 5 mL/min — ABNORMAL LOW (ref >60)
Glucose, Bld: 174 mg/dL — ABNORMAL HIGH (ref 70–99)
Potassium: 5.6 mmol/L — ABNORMAL HIGH (ref 3.5–5.1)
Sodium: 134 mmol/L — ABNORMAL LOW (ref 135–145)

## 2021-11-18 LAB — PHOSPHORUS: Phosphorus: 6.3 mg/dL — ABNORMAL HIGH (ref 2.5–4.6)

## 2021-11-18 LAB — HEPATITIS B SURFACE ANTIBODY,QUALITATIVE: Hep B S Ab: REACTIVE — AB

## 2021-11-18 LAB — CBG MONITORING, ED
Glucose-Capillary: 174 mg/dL — ABNORMAL HIGH (ref 70–99)
Glucose-Capillary: 164 mg/dL — ABNORMAL HIGH (ref 70–99)
Glucose-Capillary: 135 mg/dL — ABNORMAL HIGH (ref 70–99)
Glucose-Capillary: 140 mg/dL — ABNORMAL HIGH (ref 70–99)

## 2021-11-18 LAB — LACTIC ACID, PLASMA: Lactic Acid, Venous: 1.1 mmol/L (ref 0.5–1.9)

## 2021-11-18 LAB — HEMOGLOBIN A1C
Hgb A1c MFr Bld: 7 % — ABNORMAL HIGH (ref 4.8–5.6)
Mean Plasma Glucose: 154.2 mg/dL

## 2021-11-18 LAB — GLUCOSE, CAPILLARY: Glucose-Capillary: 154 mg/dL — ABNORMAL HIGH (ref 70–99)

## 2021-11-18 LAB — HIV ANTIBODY (ROUTINE TESTING W REFLEX): HIV Screen 4th Generation wRfx: NONREACTIVE

## 2021-11-18 MED ORDER — OXYCODONE HCL 5 MG PO TABS
5.0000 mg | ORAL_TABLET | ORAL | Status: DC | PRN
Start: 2021-11-18 — End: 2021-11-20
  Administered 2021-11-18 – 2021-11-20 (×5): 5 mg via ORAL
  Filled 2021-11-18 (×5): qty 1

## 2021-11-18 MED ORDER — DOXERCALCIFEROL 4 MCG/2ML IV SOLN
1.0000 ug | INTRAVENOUS | Status: DC
  Administered 2021-11-18 – 2021-11-27 (×5): 1 ug via INTRAVENOUS
  Filled 2021-11-18 (×10): qty 2

## 2021-11-18 MED ORDER — CHLORHEXIDINE GLUCONATE CLOTH 2 % EX PADS
6.0000 | MEDICATED_PAD | Freq: Every day | CUTANEOUS | Status: DC
  Administered 2021-11-19 – 2021-11-24 (×4): 6 via TOPICAL

## 2021-11-18 NOTE — TOC CAGE-AID Note (Signed)
Transition of Care (TOC) - CAGE-AID Screening ? ? ?Patient Details  ?Name: Blake Santana ?MRN: [id number] ?Date of Birth: Oct 08, 1970 ? ?Transition of Care (TOC) CM/SW Contact:    ?Keylen Uzelac C Tarpley-Carter, LCSWA ?Phone Number: ?11/18/2021, 1:41 PM ? ? ?Clinical Narrative: ?Pt participated in [place].  Pt stated he does not use substance or ETOH.  Pt was not offered resources, due to no usage of substance or ETOH.   ? ?Passenger transport manager, MSW, LCSW-A ?Pronouns:  She/Her/Hers ?[facility] ?Clinical Social Worker ?Direct Number:  [phone] ?[email or web address] ? ?CAGE-AID Screening: ?  ? ?Have You Ever Felt You Ought to Cut Down on Your Drinking or Drug Use?: No ?Have People Annoyed You By Critizing Your Drinking Or Drug Use?: No ?Have You Felt Bad Or Guilty About Your Drinking Or Drug Use?: No ?Have You Ever Had a Drink or Used Drugs First Thing In The Morning to Steady Your Nerves or to Get Rid of a Hangover?: No ?CAGE-AID Score: 0 ? ?Substance Abuse Education Offered: No ? ?  ? ? ? ? ? ? ?

## 2021-11-18 NOTE — ED Notes (Signed)
Leg immobilizer ?

## 2021-11-18 NOTE — ED Notes (Signed)
Patient transported to dialysis

## 2021-11-18 NOTE — ED Notes (Signed)
CBG 164  

## 2021-11-18 NOTE — ED Notes (Signed)
Patient remains in dialysis.

## 2021-11-18 NOTE — ED Notes (Signed)
Patient's belonging left at bedside given to family ?

## 2021-11-18 NOTE — Consult Note (Signed)
? ?ORTHOPAEDIC CONSULTATION ? ?REQUESTING PHYSICIAN: Barb Merino, MD ? ?Chief Complaint: Fall from ladder 20 ft ? ?HPI: ?Blake Santana is a 51 y.o. male who was level 2 trauma.  Fell 20 ft from ladder while cutting tree branches.  Denies LOC.  Multiple orthopedic injuries found during work up.  Trauma work up negative.  ESRD, DM, HTN, diabetic retinopathy. ? ?Past Medical History:  ?Diagnosis Date  ? Acute respiratory failure with hypoxia ([place]) 09/07/2018  ? Anemia of chronic kidney failure 03/11/2017  ? Cataracts, bilateral 2019  ? Chest pain 09/01/2018  ? CHF (congestive heart failure) ([place])   ? Chronic kidney disease 03/11/2017  ? Diabetes mellitus without complication ([place]) 1062  ? Edema 01/14/2017  ? Headache   ? Hypercholesterolemia   ? Hypertension 03/11/2017  ? Hypertensive retinopathy of both eyes 2019  ? Iron deficiency anemia 06/18/2016  ? Pneumonia 09/07/2018  ? Proliferative diabetic retinopathy with macular edema ([place]) 2019  ? Retinal hemorrhage, bilateral and left vitreous hemorrhage.  Being treated with Lucentis.  Dr. Iona Hansen  ? RSV (respiratory syncytial virus infection) 08/2017  ? ?Past Surgical History:  ?Procedure Laterality Date  ? A/V FISTULAGRAM Left 01/19/2018  ? Procedure: A/V FISTULAGRAM - Left AV;  Surgeon: Waynetta Sandy, MD;  Location: [facility];  Service: Cardiovascular;  Laterality: Left;  ? AV FISTULA PLACEMENT Left 08/09/2017  ? Procedure: LEFT RADIOCEPHALIC ARTERIOVENOUS (AV) FISTULA CREATION;  Surgeon: [name], MD;  [facility];  Service: Vascular;  Laterality: Left;  ? AV FISTULA PLACEMENT Left 02/02/2018  ? Procedure: ARTERIOVENOUS (AV) FISTULA CREATION LEFT UPPER EXTREMITY;  Surgeon: [name], MD;  Location: [facility];  Service: Vascular;  Laterality: Left;  ? INSERTION OF DIALYSIS CATHETER  08/09/2017  ? INSERTION OF DIALYSIS CATHETER Right 08/09/2017  ? Procedure: INSERTION OF DIALYSIS CATHETER RIGHT INTERNAL JUGULAR;  Surgeon: [name], MD;   [facility];  Service: Vascular;  Laterality: Right;  ? IR DIALY SHUNT INTRO NEEDLE/INTRACATH INITIAL W/IMG LEFT Left 07/10/2020  ? IR DIALY SHUNT INTRO NEEDLE/INTRACATH INITIAL W/IMG LEFT Left 04/14/2021  ? IR REMOVAL TUN CV CATH W/O FL  07/01/2018  ? LEFT HEART CATH AND CORONARY ANGIOGRAPHY N/A 09/05/2018  ? Procedure: LEFT HEART CATH AND CORONARY ANGIOGRAPHY;  Surgeon: [name], MD;  Location: [facility];  Service: Cardiovascular;  Laterality: N/A;  ? ?Social History  ? ?Socioeconomic History  ? Marital status: Married  ?  Spouse name: [name]  ? Number of children: 1  ? Years of education: 22  ? Highest education level: Not on file  ?Occupational History  ? Occupation: [employer]  ?Tobacco Use  ? Smoking status: Former  ?  Types: Cigarettes  ? Smokeless tobacco: Never  ?Vaping Use  ? Vaping Use: Never used  ?Substance and Sexual Activity  ? Alcohol use: Not Currently  ? Drug use: No  ? Sexual activity: Not on file  ?Other Topics Concern  ? Not on file  ?Social History Narrative  ? Originally from [place]  ? Came to [place] In 1999  ? Lives with his girlfriend and their son.  ? ?Social Determinants of Health  ? ?Financial Resource Strain: Not on file  ?Food Insecurity: Not on file  ?Transportation Needs: Not on file  ?Physical Activity: Not on file  ?Stress: Not on file  ?Social Connections: Not on file  ? ?Family History  ?Problem Relation Age of Onset  ? Diabetes Mother   ?  Kidney disease Mother   ?     renal failure--diabetes, cause of death  ? Hypertension Mother   ? Diabetes Father   ? Kidney disease Father   ?     Cause of death:  kidney failure form diabetes  ? Alcohol abuse Father   ? Diabetes Sister   ? ?- negative except otherwise stated in the family history section ?No Known Allergies ?Prior to Admission medications   ?Medication Sig Start Date End Date Taking? Authorizing Provider  ?acetaminophen (TYLENOL) 500 MG tablet Take 1,000 mg by mouth every 6 (six) hours as needed for  moderate pain or headache.    [provider]  ?AgaMatrix Ultra-Thin Lancets MISC Check blood glucose twice daily before meals. 06/09/21   Fenton Foy, NP  ?amLODipine (NORVASC) 5 MG tablet Take 1 tablet by mouth once daily with breakfast ?Patient taking differently: Take 5 mg by mouth daily. 10/18/19   Mack Hook, MD  ?aspirin EC 81 MG EC tablet Take 1 tablet (81 mg total) by mouth daily. ?Patient not taking: Reported on 04/11/2021 09/08/18   Mina Marble P, DO  ?b complex-vitamin c-folic acid (NEPHRO-VITE) 0.8 MG TABS tablet Take 1 tablet by mouth Every Tuesday,Thursday,and Saturday with dialysis.  08/11/18   [provider]  ?Blood Glucose Monitoring Suppl (AGAMATRIX PRESTO) w/Device KIT Check sugars twice daily 06/04/16   Mack Hook, MD  ?cinacalcet (SENSIPAR) 60 MG tablet Take 60 mg by mouth daily.    [provider]  ?colchicine 0.6 MG tablet Take 1 tablet (0.6 mg total) by mouth daily for 1 day. 06/04/21 06/05/21  Redwine, Madison A, PA-C  ?glipiZIDE (GLUCOTROL) 10 MG tablet Take 1 tablet (10 mg total) by mouth 2 (two) times daily before a meal. 01/16/21 04/16/21  [name], NP  ?glucose blood (AGAMATRIX PRESTO TEST) test strip Check sugars twice daily 06/09/21   Fenton Foy, NP  ?guaiFENesin (MUCINEX) 600 MG 12 hr tablet Take 600 mg by mouth 2 (two) times daily as needed for cough.    [provider]  ?guaifenesin (ROBITUSSIN) 100 MG/5ML syrup Take 200 mg by mouth 3 (three) times daily as needed for cough.    [provider]  ?lanthanum (FOSRENOL) 1000 MG chewable tablet Chew 2,000 mg by mouth 3 (three) times daily with meals.    [provider]  ?lidocaine-prilocaine (EMLA) cream Apply 1 application topically 3 (three) times a week. Use at dialysis 02/17/21   [provider]  ?loperamide (IMODIUM A-D) 2 MG tablet Take 2 mg by mouth 4 (four) times daily as needed for diarrhea or loose stools.    [provider]   ?metoprolol tartrate (LOPRESSOR) 25 MG tablet TAKE 1/2 (ONE-HALF) TABLET BY MOUTH TWICE DAILY WITH MEALS ?Patient taking differently: Take 12.5 mg by mouth 2 (two) times daily. 10/18/19   Mack Hook, MD  ?rosuvastatin (CRESTOR) 20 MG tablet Take 1 tablet (20 mg total) by mouth daily at 6 PM. 01/16/21 04/16/21  [name], NP  ?sucroferric oxyhydroxide (VELPHORO) 500 MG chewable tablet Chew 2 tablets (1,000 mg total) by mouth 3 (three) times daily with meals. ?Patient not taking: Reported on 04/11/2021 10/18/19   Mack Hook, MD  ? ?DG Elbow 2 Views Left ? ?Result Date: 11/17/2021 ?CLINICAL DATA:  Left elbow dislocation.  Post reduction imaging. EXAM: LEFT ELBOW - 2 VIEW COMPARISON:  8:28 p.m., 5:52 p.m. FINDINGS: Single-view frontal radiograph of the left elbow demonstrates normal alignment of the ulnohumeral and radiocapitellar articulation  on this single view examination. Extensive surrounding soft tissue swelling is noted. Vascular calcifications are noted. IMPRESSION: Normal alignment on this single view examination. Normal alignment when correlated with prior radiograph of 8:28 p.m. Electronically Signed   By: Fidela Salisbury M.D.   On: 11/17/2021 23:01  ? ?DG Elbow 2 Views Left ? ?Result Date: 11/17/2021 ?CLINICAL DATA:  Left elbow dislocation status post reduction EXAM: LEFT ELBOW - 2 VIEW COMPARISON:  Same day radiographs FINDINGS: Single lateral radiograph of the elbow obtained status post reduction. Alignment appears anatomic on single view. Known discrete fracture. Diffuse soft tissue swelling. Overlying splint. IMPRESSION: Interval reduction of elbow dislocation with anatomic alignment on single view. Electronically Signed   By: Davina Poke D.O.   On: 11/17/2021 20:35  ? ?DG Elbow 2 Views Left ? ?Result Date: 11/17/2021 ?CLINICAL DATA:  Fall, left elbow deformity EXAM: LEFT ELBOW - 2 VIEW COMPARISON:  None. FINDINGS: Posterior dislocation of the radial head and proximal ulna at the left  elbow joint with surrounding soft tissue swelling. No discrete fracture. No focal osseous lesions. Surgical clips anterior to the left elbow. IMPRESSION: Posterior dislocation of the radial head and proximal

## 2021-11-18 NOTE — Consult Note (Signed)
Renal Service ?Consult Note ?[facility] ? ?Blake Santana ?11/18/2021 ?Robert D Schertz, MD ?Requesting Physician: Dr. K. Ghimire ? ?Reason for Consult: ESRD pt sp fall from 20 ft ?HPI: The patient is a 51 y.o. year-old w/ hx of anemia, CHF, DM2, HL, HTN, ESRD on HD who fell from about 20 ft height yesterday and suffered multiple traumatic injuries. Pt was cutting tree branches when a branch fell on him knocking him off the ladder. In ED BP was high 220/ 118, HR 85, RR 18. K 4.8 Cr 10 BUN 75. Hb 10 WBC 12K.  Imaging showed multiple fractures and L elbow dislocation. Dislocation was reduced in the ED. Orthopedics is consulting. Asked to see for ESRD.   ? ?Pt seen in the ED.  On HD for about 4 years, no recent HD issues. Access is in the L upper arm. No SOB or chest pain.  ? ?ROS - denies CP, no joint pain, no HA, no blurry vision, no rash, no diarrhea, no nausea/ vomiting ? ? ?Past Medical History  ?Past Medical History:  ?Diagnosis Date  ? Acute respiratory failure with hypoxia ([facility]) 09/07/2018  ? Anemia of chronic kidney failure 03/11/2017  ? Cataracts, bilateral 2019  ? Chest pain 09/01/2018  ? CHF (congestive heart failure) ([facility])   ? Chronic kidney disease 03/11/2017  ? Diabetes mellitus without complication ([facility]) 1999  ? Edema 01/14/2017  ? Headache   ? Hypercholesterolemia   ? Hypertension 03/11/2017  ? Hypertensive retinopathy of both eyes 2019  ? Iron deficiency anemia 06/18/2016  ? Pneumonia 09/07/2018  ? Proliferative diabetic retinopathy with macular edema ([facility]) 2019  ? Retinal hemorrhage, bilateral and left vitreous hemorrhage.  Being treated with Lucentis.  Dr. Haines  ? RSV (respiratory syncytial virus infection) 08/2017  ? ?Past Surgical History  ?Past Surgical History:  ?Procedure Laterality Date  ? A/V FISTULAGRAM Left 01/19/2018  ? Procedure: A/V FISTULAGRAM - Left AV;  Surgeon: Cain, Brandon Christopher, MD;  [facility];  Service: Cardiovascular;  Laterality: Left;   ? AV FISTULA PLACEMENT Left 08/09/2017  ? Procedure: LEFT RADIOCEPHALIC ARTERIOVENOUS (AV) FISTULA CREATION;  Surgeon: Early, Todd F, MD;  [facility];  Service: Vascular;  Laterality: Left;  ? AV FISTULA PLACEMENT Left 02/02/2018  ? Procedure: ARTERIOVENOUS (AV) FISTULA CREATION LEFT UPPER EXTREMITY;  Surgeon: Early, Todd F, MD;  [facility];  Service: Vascular;  Laterality: Left;  ? INSERTION OF DIALYSIS CATHETER  08/09/2017  ? INSERTION OF DIALYSIS CATHETER Right 08/09/2017  ? Procedure: INSERTION OF DIALYSIS CATHETER RIGHT INTERNAL JUGULAR;  Surgeon: Early, Todd F, MD;  [facility];  Service: Vascular;  Laterality: Right;  ? IR DIALY SHUNT INTRO NEEDLE/INTRACATH INITIAL W/IMG LEFT Left 07/10/2020  ? IR DIALY SHUNT INTRO NEEDLE/INTRACATH INITIAL W/IMG LEFT Left 04/14/2021  ? IR REMOVAL TUN CV CATH W/O FL  07/01/2018  ? LEFT HEART CATH AND CORONARY ANGIOGRAPHY N/A 09/05/2018  ? Procedure: LEFT HEART CATH AND CORONARY ANGIOGRAPHY;  Surgeon: Varanasi, Jayadeep S, MD;  [facility];  Service: Cardiovascular;  Laterality: N/A;  ? ?Family History  ?Family History  ?Problem Relation Age of Onset  ? Diabetes Mother   ? Kidney disease Mother   ?     renal failure--diabetes, cause of death  ? Hypertension Mother   ? Diabetes Father   ? Kidney disease Father   ?     Cause of death:  kidney failure form diabetes  ? Alcohol abuse   Father   ? Diabetes Sister   ? ?Social History  reports that he has quit smoking. His smoking use included cigarettes. He has never used smokeless tobacco. He reports that he does not currently use alcohol. He reports that he does not use drugs. ?Allergies No Known Allergies ?Home medications ?Prior to Admission medications   ?Medication Sig Start Date End Date Taking? Authorizing Provider  ?acetaminophen (TYLENOL) 500 MG tablet Take 1,000 mg by mouth every 6 (six) hours as needed for moderate pain or headache.    [provider]  ?AgaMatrix Ultra-Thin Lancets MISC Check  blood glucose twice daily before meals. 06/09/21   Nichols, Tonya S, NP  ?amLODipine (NORVASC) 5 MG tablet Take 1 tablet by mouth once daily with breakfast ?Patient taking differently: Take 5 mg by mouth daily. 10/18/19   Mulberry, Elizabeth, MD  ?aspirin EC 81 MG EC tablet Take 1 tablet (81 mg total) by mouth daily. ?Patient not taking: Reported on 04/11/2021 09/08/18   Mullis, Kiersten P, DO  ?b complex-vitamin c-folic acid (NEPHRO-VITE) 0.8 MG TABS tablet Take 1 tablet by mouth Every Tuesday,Thursday,and Saturday with dialysis.  08/11/18   [provider]  ?Blood Glucose Monitoring Suppl (AGAMATRIX PRESTO) w/Device KIT Check sugars twice daily 06/04/16   Mulberry, Elizabeth, MD  ?cinacalcet (SENSIPAR) 60 MG tablet Take 60 mg by mouth daily.    [provider]  ?colchicine 0.6 MG tablet Take 1 tablet (0.6 mg total) by mouth daily for 1 day. 06/04/21 06/05/21  Redwine, Madison A, PA-C  ?glipiZIDE (GLUCOTROL) 10 MG tablet Take 1 tablet (10 mg total) by mouth 2 (two) times daily before a meal. 01/16/21 04/16/21  Stephens, Amy J, NP  ?glucose blood (AGAMATRIX PRESTO TEST) test strip Check sugars twice daily 06/09/21   Nichols, Tonya S, NP  ?guaiFENesin (MUCINEX) 600 MG 12 hr tablet Take 600 mg by mouth 2 (two) times daily as needed for cough.    [provider]  ?guaifenesin (ROBITUSSIN) 100 MG/5ML syrup Take 200 mg by mouth 3 (three) times daily as needed for cough.    [provider]  ?lanthanum (FOSRENOL) 1000 MG chewable tablet Chew 2,000 mg by mouth 3 (three) times daily with meals.    [provider]  ?lidocaine-prilocaine (EMLA) cream Apply 1 application topically 3 (three) times a week. Use at dialysis 02/17/21   [provider]  ?loperamide (IMODIUM A-D) 2 MG tablet Take 2 mg by mouth 4 (four) times daily as needed for diarrhea or loose stools.    [provider]  ?metoprolol tartrate (LOPRESSOR) 25 MG tablet TAKE 1/2 (ONE-HALF) TABLET BY MOUTH TWICE DAILY WITH  MEALS ?Patient taking differently: Take 12.5 mg by mouth 2 (two) times daily. 10/18/19   Mulberry, Elizabeth, MD  ?rosuvastatin (CRESTOR) 20 MG tablet Take 1 tablet (20 mg total) by mouth daily at 6 PM. 01/16/21 04/16/21  Stephens, Amy J, NP  ?sucroferric oxyhydroxide (VELPHORO) 500 MG chewable tablet Chew 2 tablets (1,000 mg total) by mouth 3 (three) times daily with meals. ?Patient not taking: Reported on 04/11/2021 10/18/19   Mulberry, Elizabeth, MD  ? ? ? ?Vitals:  ? 11/18/21 0015 11/18/21 0100 11/18/21 0144 11/18/21 0434  ?BP: (!) 170/85 (!) 169/81  127/70  ?Pulse: 86 79  76  ?Resp: 20 15  20  ?Temp:    99 ?F (37.2 ?C)  ?TempSrc:    Oral  ?SpO2: 100% (!) 63% 100% 100%  ?Weight:      ?Height:      ? ?  Exam ?Gen alert, no distress ?No rash, cyanosis or gangrene ?Sclera anicteric, throat clear  ?No jvd or bruits ?Chest clear bilat to bases, no rales/ wheezing ?RRR no RG ?Abd soft ntnd no mass or ascites +bs ?GU normal male ?Ext no LE or UE edema ?Neuro is alert, Ox 3 , nf ?   LUA AVF +t/b ? ? ? Home meds include - norvasc 5, asa, senispar 60 qd, glipizide, fosrenol 2012m tid ac, lopressor 12.5 bid, crestor, prns/ vits/ supps ? ? [place]   63.5kg   400/800   2/2 bath  LUA AVF   ? - last Hb 11 on 4/11 ? - hectorol 1 ug tiw IV ? - no esa/ Fe ? ? ?Assessment/ Plan: ?Fall/ multiple traumatic injuries - hemodynamically stable and should be okay for HD today.  ?ESRD - on HD TTS, last HD Sat. Plan is for HD today. K+ a bit high, use low K+ bath.  ?HTN/ vol - BP's high in ED 165/75. No edema on exam, clear lungs. Plan 3 L UF goal w/ Hd today.  ?Anemia esrd - not on op ESA or Fe, last Hb 11. Acute drop Hb here, transfuse prn Hb < 7.   ?MBD ckd - CCa in range, will add on phos. Cont fosrenol as binder, cont vdra IV w/ HD.  ?  ? ? ? ?RoKelly SplinterMD ?11/18/2021, 9:00 AM ?Recent Labs  ?Lab 11/17/21 ?1634 11/17/21 ?1707 11/18/21 ?0421  ?HGB 10.7* 11.2* 8.4*  ?ALBUMIN 3.6  --   --   ?CALCIUM 9.2  --  8.6*   ?CREATININE 10.78* 11.90* 11.33*  ?K 4.8 4.7 5.6*  ? ? ?

## 2021-11-18 NOTE — Progress Notes (Signed)
Patient arrived to unit via stretcher from dialysis. Transferred to bed by sliding patient.  A/O, c/o pain 10/10.  2L oxygen continued at this time. Family at bedside.  ?

## 2021-11-18 NOTE — Progress Notes (Signed)
?  11/18/21 1552  ?Vitals  ?BP (!) 149/84  ?Pulse Rate 100  ?Resp (!) 22  ?During Hemodialysis Assessment  ?HD Safety Checks Performed Yes  ?Intra-Hemodialysis Comments See progress note  ?Post-Hemodialysis Assessment  ?Rinseback Volume (mL) 250 mL  ?Dialyzer Clearance Lightly streaked  ?Duration of HD Treatment -hour(s) 2.03 hour(s)  ?Hemodialysis Intake (mL) 500 mL  ?UF Total -Machine (mL) 1674 mL  ?Net UF (mL) 1174 mL  ?Tolerated HD Treatment No (Comment)  ?Post-Hemodialysis Comments Shortly after arrival to HD unit pt expressed severe pain, 29m dilaudid administered. After 2 hours pt slow to respond and feeling cold and clammy BP soft 99 systolic, blood glucose 1521  Provider at bedside. Blood rinsed back and pt improved toward baseline.  Pt remained in HD 2 hours post tx d/t no room being available.. At this time he is  alert and oriented, easily aroused, not in acute pain or distress, baseline has been met. Net 1.1 L removed. No other concerns present.  ?AVG/AVF Arterial Site Held (minutes) 7 minutes  ?AVG/AVF Venous Site Held (minutes) 7 minutes  ? ? ?

## 2021-11-18 NOTE — Progress Notes (Signed)
?PROGRESS NOTE ? ? ? ?Blake Santana  XQJ:[id number] DOB: 11-13-1970 DOA: 11/17/2021 ?PCP: [name], NP  ? ? ?Brief Narrative:  ?51 year old gentleman, Spanish only speaking with medical history of ESRD on TTS dialysis, type 2 diabetes with diabetic retinopathy, hypertension and hyperlipidemia presented to the ER with whole body pain after falling from 20 feet ladder while cutting tree branches.  Patient in the ER with uncontrolled pain, multiple skeletal injury as below. ? ? ?Assessment & Plan: ?  ?Polytrauma with following: ?Right side, ?Right tibial platue fracture ?Mildly displaced fracture of the base of proximal phalanx of right fifth toe ? ?Left side, ?Acute comminuted left inferior pubic ramus fracture. ?Acute comminuted fracture of the left distal fibula and tibia with extension to the medial malleolus; possible left calcaneal fracture ?Acute comminuted fracture left distal radius ?Posterior fracture dislocation of the left elbow ? ?Patient currently neurologically and hemodynamically stable.  Followed by trauma surgery.  Anticipate multiple fracture fixations.  Dialysis patient.  Can use adequate pain medications with Dilaudid and oxycodone.  Keeping n.p.o.  To further management as per trauma. ? ?ESRD on hemodialysis: Due for dialysis today.  He has left AC AV fistula.  His left elbow dislocation is reduced and is anatomical.  He does have some posterior component fracture.  Patient currently on above arm back support. ?There is no other dialysis access. ?We will ask Ortho technician to remove the compression bandage and allow for dialysis access.  Seen by nephrology. ?Discussed with nephrology, apparently can stay elbow flexed during cannulation, also will keep on back slab, expose anterior aspect and do hemodialysis. ? ?Essential hypertension: Blood pressure stable.  Started on home medication regimen with amlodipine, Lopressor. ? ?DVT prophylaxis: SCDs Start: 11/17/21 2158 ? ? ?Code  Status: Full code ?Family Communication: None ?Disposition Plan: Status is: Inpatient ?Remains inpatient appropriate because: Polytrauma, severe multiple fracture dislocations. ?  ? ? ?Consultants:  ?Trauma ?Nephrology ? ?Procedures:  ?None ? ?Antimicrobials:  ?None ? ? ?Subjective: ?Patient seen and examined.  Video interpreter was used.  Patient with pain everywhere, more so on pelvis.  He tells me that he cannot move his legs mostly the left leg.  Was worried about the outcome. ? ?I tried to explain to him about all the injuries he have.  Blood pressures are stable now. ?CT scan of the left elbow with reduced dislocation, however with some posterior competent fractures. ?He does not have any other dialysis access, he will need dialysis before going to surgery hence we will access left AC AV fistula.  ? ?Objective: ?Vitals:  ? 11/18/21 0100 11/18/21 0144 11/18/21 0434 11/18/21 0903  ?BP: (!) 169/81  127/70 (!) 165/74  ?Pulse: 79  76 85  ?Resp: '15  20 18  '$ ?Temp:   99 ?F (37.2 ?C) 98.1 ?F (36.7 ?C)  ?TempSrc:   Oral Oral  ?SpO2: (!) 63% 100% 100% 94%  ?Weight:      ?Height:      ? ? ?Intake/Output Summary (Last 24 hours) at 11/18/2021 1312 ?Last data filed at 11/18/2021 0001 ?Gross per 24 hour  ?Intake 1000 ml  ?Output 0 ml  ?Net 1000 ml  ? ?Filed Weights  ? 11/17/21 1950 11/17/21 2128  ?Weight: 64 kg 64 kg  ? ? ?Examination: ? ?General exam: Appears anxious .  Patient is in moderate distress with pain. ?It hurts to examine him. ?Respiratory system: Clear to auscultation. Respiratory effort normal.  No added sounds. ?Cardiovascular system: S1 & S2 heard,  RRR.  ?Gastrointestinal system: Abdomen is nondistended, soft and nontender. No organomegaly or masses felt. Normal bowel sounds heard. ?Central nervous system: Alert and oriented. No focal neurological deficits.  Unable to move lower extremities with pain. ?Left arm on posterior splint, painful swelling of the left fingers. ?Pelvic compression painful. ?Both lower  extremities are very painful, right knee is swollen and painful.  ? ? ?Data Reviewed: I have personally reviewed following labs and imaging studies ? ?CBC: ?Recent Labs  ?Lab 11/17/21 ?1634 11/17/21 ?1707 11/18/21 ?0421  ?WBC 12.2*  --  10.7*  ?HGB 10.7* 11.2* 8.4*  ?HCT 32.5* 33.0* 26.1*  ?MCV 95.3  --  97.4  ?PLT 172  --  154  ? ?Basic Metabolic Panel: ?Recent Labs  ?Lab 11/17/21 ?1634 11/17/21 ?1707 11/18/21 ?0421  ?NA 136 135 134*  ?K 4.8 4.7 5.6*  ?CL 93* 98 93*  ?CO2 24  --  23  ?GLUCOSE 229* 229* 174*  ?BUN 75* 75* 83*  ?CREATININE 10.78* 11.90* 11.33*  ?CALCIUM 9.2  --  8.6*  ? ?GFR: ?Estimated Creatinine Clearance: 6.8 mL/min (A) (by C-G formula based on SCr of 11.33 mg/dL (H)). ?Liver Function Tests: ?Recent Labs  ?Lab 11/17/21 ?1634  ?AST 19  ?ALT 16  ?ALKPHOS 289*  ?BILITOT 0.7  ?PROT 7.3  ?ALBUMIN 3.6  ? ?No results for input(s): LIPASE, AMYLASE in the last 168 hours. ?No results for input(s): AMMONIA in the last 168 hours. ?Coagulation Profile: ?Recent Labs  ?Lab 11/17/21 ?1634  ?INR 1.1  ? ?Cardiac Enzymes: ?No results for input(s): CKTOTAL, CKMB, CKMBINDEX, TROPONINI in the last 168 hours. ?BNP (last 3 results) ?No results for input(s): PROBNP in the last 8760 hours. ?HbA1C: ?Recent Labs  ?  11/18/21 ?0421  ?HGBA1C 7.0*  ? ?CBG: ?Recent Labs  ?Lab 11/17/21 ?2357 11/18/21 ?0102 11/18/21 ?7253 11/18/21 ?1145  ?GLUCAP 229* 174* 164* 135*  ? ?Lipid Profile: ?No results for input(s): CHOL, HDL, LDLCALC, TRIG, CHOLHDL, LDLDIRECT in the last 72 hours. ?Thyroid Function Tests: ?No results for input(s): TSH, T4TOTAL, FREET4, T3FREE, THYROIDAB in the last 72 hours. ?Anemia Panel: ?No results for input(s): VITAMINB12, FOLATE, FERRITIN, TIBC, IRON, RETICCTPCT in the last 72 hours. ?Sepsis Labs: ?Recent Labs  ?Lab 11/17/21 ?1634 11/18/21 ?0421  ?LATICACIDVEN 1.2 1.1  ? ? ?Recent Results (from the past 240 hour(s))  ?Resp Panel by RT-PCR (Flu A&B, Covid) Nasopharyngeal Swab     Status: None  ? Collection Time:  11/17/21  4:34 PM  ? Specimen: Nasopharyngeal Swab; Nasopharyngeal(NP) swabs in vial transport medium  ?Result Value Ref Range Status  ? SARS Coronavirus 2 by RT PCR NEGATIVE NEGATIVE Final  ?  Comment: (NOTE) ?SARS-CoV-2 target nucleic acids are NOT DETECTED. ? ?The SARS-CoV-2 RNA is generally detectable in upper respiratory ?specimens during the acute phase of infection. The lowest ?concentration of SARS-CoV-2 viral copies this assay can detect is ?138 copies/mL. A negative result does not preclude SARS-Cov-2 ?infection and should not be used as the sole basis for treatment or ?other patient management decisions. A negative result may occur with  ?improper specimen collection/handling, submission of specimen other ?than nasopharyngeal swab, presence of viral mutation(s) within the ?areas targeted by this assay, and inadequate number of viral ?copies(<138 copies/mL). A negative result must be combined with ?clinical observations, patient history, and epidemiological ?information. The expected result is Negative. ? ?Fact Sheet for Patients:  ?EntrepreneurPulse.com.au ? ?Fact Sheet for Healthcare Providers:  ?IncredibleEmployment.be ? ?This test is no t yet approved or cleared by  the [employer] and  ?has been authorized for detection and/or diagnosis of SARS-CoV-2 by ?FDA under an Emergency Use Authorization (EUA). This EUA will remain  ?in effect (meaning this test can be used) for the duration of the ?COVID-19 declaration under Section 564(b)(1) of the Act, 21 ?U.S.C.section 360bbb-3(b)(1), unless the authorization is terminated  ?or revoked sooner.  ? ? ?  ? Influenza A by PCR NEGATIVE NEGATIVE Final  ? Influenza B by PCR NEGATIVE NEGATIVE Final  ?  Comment: (NOTE) ?The Xpert Xpress SARS-CoV-2/FLU/RSV plus assay is intended as an aid ?in the diagnosis of influenza from Nasopharyngeal swab specimens and ?should not be used as a sole basis for treatment. Nasal washings  and ?aspirates are unacceptable for Xpert Xpress SARS-CoV-2/FLU/RSV ?testing. ? ?Fact Sheet for Patients: ?EntrepreneurPulse.com.au ? ?Fact Sheet for Healthcare Providers: ?[employer]Runner.is

## 2021-11-18 NOTE — Progress Notes (Signed)
Called over to assess the patient while on dialysis - lethargic, poorly responsive, clammy. Hypotensive but with normal RR, oxygenation. Normal rhythm on telemetry, BS normal. UF off, and NS bolus given - with minimal improvement. Elected to end HD early and return his blood. BP improved and he was more responsive. He is still quite drowsy, but has been up all night and had dilaudid recently (1336p). He was able to answer some basic questions. Since BP was improved any remained without resp distress, did not give narcan. Will have dialysis RN pass give this info to floor RN for ongoing monitoring. ? ?Veneta Penton, PA-C ?[facility] ?Pager [phone] ? ?

## 2021-11-19 LAB — TYPE AND SCREEN
ABO/RH(D): O POS
Antibody Screen: NEGATIVE
Unit division: 0

## 2021-11-19 LAB — SURGICAL PCR SCREEN
MRSA, PCR: NEGATIVE
Staphylococcus aureus: NEGATIVE

## 2021-11-19 LAB — HEPATITIS B SURFACE ANTIBODY, QUANTITATIVE: Hep B S AB Quant (Post): 10.2 m[IU]/mL (ref >9.9)

## 2021-11-19 LAB — CBC WITH DIFFERENTIAL/PLATELET
Abs Immature Granulocytes: 0.03 10*3/uL (ref 0.00–0.07)
Basophils Absolute: 0.1 10*3/uL (ref 0.0–0.1)
Basophils Relative: 1 %
Eosinophils Absolute: 0 10*3/uL (ref 0.0–0.5)
Eosinophils Relative: 0 %
HCT: 24.3 % — ABNORMAL LOW (ref 39.0–52.0)
Hemoglobin: 7.8 g/dL — ABNORMAL LOW (ref 13.0–17.0)
Immature Granulocytes: 0 %
Lymphocytes Relative: 16 %
Lymphs Abs: 1.6 10*3/uL (ref 0.7–4.0)
MCH: 31.7 pg (ref 26.0–34.0)
MCHC: 32.1 g/dL (ref 30.0–36.0)
MCV: 98.8 fL (ref 80.0–100.0)
Monocytes Absolute: 1.1 10*3/uL — ABNORMAL HIGH (ref 0.1–1.0)
Monocytes Relative: 10 %
Neutro Abs: 7.3 10*3/uL (ref 1.7–7.7)
Neutrophils Relative %: 73 %
Platelets: 138 10*3/uL — ABNORMAL LOW (ref 150–400)
RBC: 2.46 MIL/uL — ABNORMAL LOW (ref 4.22–5.81)
RDW: 14.6 % (ref 11.5–15.5)
WBC: 10.1 10*3/uL (ref 4.0–10.5)
nRBC: 0 % (ref 0.0–0.2)

## 2021-11-19 LAB — BASIC METABOLIC PANEL
Anion gap: 19 — ABNORMAL HIGH (ref 5–15)
BUN: 71 mg/dL — ABNORMAL HIGH (ref 6–20)
CO2: 22 mmol/L (ref 22–32)
Calcium: 9.4 mg/dL (ref 8.9–10.3)
Chloride: 95 mmol/L — ABNORMAL LOW (ref 98–111)
Creatinine, Ser: 11.31 mg/dL — ABNORMAL HIGH (ref 0.61–1.24)
GFR, Estimated: 5 mL/min — ABNORMAL LOW (ref >60)
Glucose, Bld: 180 mg/dL — ABNORMAL HIGH (ref 70–99)
Potassium: 5.3 mmol/L — ABNORMAL HIGH (ref 3.5–5.1)
Sodium: 136 mmol/L (ref 135–145)

## 2021-11-19 LAB — GLUCOSE, CAPILLARY
Glucose-Capillary: 138 mg/dL — ABNORMAL HIGH (ref 70–99)
Glucose-Capillary: 147 mg/dL — ABNORMAL HIGH (ref 70–99)
Glucose-Capillary: 156 mg/dL — ABNORMAL HIGH (ref 70–99)
Glucose-Capillary: 167 mg/dL — ABNORMAL HIGH (ref 70–99)
Glucose-Capillary: 171 mg/dL — ABNORMAL HIGH (ref 70–99)
Glucose-Capillary: 178 mg/dL — ABNORMAL HIGH (ref 70–99)
Glucose-Capillary: 182 mg/dL — ABNORMAL HIGH (ref 70–99)
Glucose-Capillary: 211 mg/dL — ABNORMAL HIGH (ref 70–99)

## 2021-11-19 LAB — BPAM RBC
Blood Product Expiration Date: 202304202359
Unit Type and Rh: 9500

## 2021-11-19 LAB — PREPARE RBC (CROSSMATCH)

## 2021-11-19 MED ORDER — SODIUM CHLORIDE 0.9 % IV BOLUS: 500.0000 mL | Freq: Once | INTRAVENOUS | Status: DC | PRN

## 2021-11-19 MED ORDER — SODIUM CHLORIDE 0.9 % IV BOLUS: 500.0000 mL | Freq: Once | INTRAVENOUS | Status: DC

## 2021-11-19 MED ORDER — SODIUM ZIRCONIUM CYCLOSILICATE 10 G PO PACK
10.0000 g | PACK | ORAL | Status: AC
  Administered 2021-11-19: 10 g via ORAL
  Filled 2021-11-19: qty 1

## 2021-11-19 MED ORDER — CINACALCET HCL 30 MG PO TABS
60.0000 mg | ORAL_TABLET | Freq: Every day | ORAL | Status: DC
  Administered 2021-11-19 – 2021-11-26 (×7): 60 mg via ORAL
  Filled 2021-11-19 (×9): qty 2

## 2021-11-19 MED ORDER — SODIUM CHLORIDE 0.9% IV SOLUTION: Freq: Once | INTRAVENOUS | Status: DC

## 2021-11-19 MED ORDER — DARBEPOETIN ALFA 60 MCG/0.3ML IJ SOSY
60.0000 ug | PREFILLED_SYRINGE | INTRAMUSCULAR | Status: DC
  Administered 2021-11-20: 60 ug via INTRAVENOUS
  Filled 2021-11-19 (×4): qty 0.3

## 2021-11-19 MED ORDER — LANTHANUM CARBONATE 500 MG PO CHEW
2000.0000 mg | CHEWABLE_TABLET | Freq: Three times a day (TID) | ORAL | Status: DC
  Administered 2021-11-19 – 2021-11-26 (×16): 2000 mg via ORAL
  Filled 2021-11-19 (×26): qty 4

## 2021-11-19 MED ORDER — PROSOURCE PLUS PO LIQD
30.0000 mL | Freq: Two times a day (BID) | ORAL | Status: DC
  Administered 2021-11-21 – 2021-11-27 (×11): 30 mL via ORAL
  Filled 2021-11-19 (×12): qty 30

## 2021-11-19 NOTE — Plan of Care (Signed)
Added

## 2021-11-19 NOTE — Progress Notes (Signed)
?PROGRESS NOTE ? ? ? ?Blake Santana  MPN:[id number] DOB: 10/10/1970 DOA: 11/17/2021 ?PCP: [name], NP  ? ? ?Brief Narrative:  ?51 year old gentleman, Spanish only speaking with medical history of ESRD on TTS dialysis, type 2 diabetes with diabetic retinopathy, hypertension and hyperlipidemia presented to the ER with whole body pain after falling from 20 feet ladder while cutting tree branches.  Patient in the ER with uncontrolled pain, multiple skeletal injury as below. ?Patient seen by trauma, admitted to medical service for dialysis needs.  With multitrauma, needing multiple operative interventions. ? ? ?Assessment & Plan: ?  ?Polytrauma with following: ?Right side, ?Right tibial platue fracture ?Mildly displaced fracture of the base of proximal phalanx of right fifth toe ? ?Left side, ?Acute comminuted left inferior pubic ramus fracture. ?Acute comminuted fracture of the left distal fibula and tibia with extension to the medial malleolus; possible left calcaneal fracture ?Acute comminuted fracture left distal radius ?Posterior fracture dislocation of the left elbow ? ?Patient currently neurologically and hemodynamically stable.  Followed by trauma surgery.  Anticipate multiple fracture fixations.  Dialysis patient.  Can use adequate pain medications with Dilaudid and oxycodone.  ?Timing of surgery as per trauma. ? ?Anemia of acute blood loss: Anticipated from multiple long bone fractures and pelvic fracture. ?Baseline hemoglobin 11-7.8 today. ?Episodes of hypotension after high dose of pain medications.  Intradialytic hypotension anticipated. ?will receive 1 unit of PRBC with hemodialysis tomorrow morning before going to surgery. ? ?ESRD on hemodialysis: Dialysis 4/18.  He has left AC AV fistula.  His left elbow dislocation is reduced and is anatomical.  He does have some posterior component fracture. ?Using AV fistula after removing the posterior cast.   ? ?Essential hypertension: Blood pressure  stable.  Intermittent drop in blood pressures.  Discontinue all antihypertensives.  We will give 500 mL of fluid today as his systolic blood pressure 00/86 after pain medication.  ? ?Multiple discussions with nephrology, anesthesia about patient's readiness to surgery. ?-Polytrauma patient with severe pain and immobility, he needs operative fixation for his fractures as soon as possible to improve long-term outcome, rehab potentials as well as preventing from complications related to pain medications, bedbound status. ?-Weighing risk versus benefits, patient will benefit with operative fixation as soon as possible.  Would recommend patient go to surgery. ?Anesthesia recommended another session of hemodialysis before taking him to surgery.  Ordered 1 unit of PRBC to get along with dialysis. ?Will allow regular diet, dialysis tomorrow followed by surgery. ? ?DVT prophylaxis: SCDs Start: 11/17/21 2158 ? ? ?Code Status: Full code ?Family Communication: None ?Disposition Plan: Status is: Inpatient ?Remains inpatient appropriate because: Polytrauma, severe multiple fracture dislocations. ?  ? ? ?Consultants:  ?Trauma ?Nephrology ? ?Procedures:  ?None ? ?Antimicrobials:  ?None ? ? ?Subjective: ? ?Patient is sleepy after just receiving Dilaudid injections.  He told me he was hurting too much.  Other than pain no other overnight events.  He is on room air. ?Received hemodialysis as per nephrology yesterday.  Patient is consented for blood transfusion if needed. ? ?Objective: ?Vitals:  ? 11/18/21 1833 11/18/21 2019 11/19/21 0400 11/19/21 0848  ?BP: (!) 173/84 115/62 138/65 128/63  ?Pulse: 95 84 93   ?Resp: '17 15 15 16  '$ ?Temp: 98.2 ?F (36.8 ?C) 98.2 ?F (36.8 ?C) 98.3 ?F (36.8 ?C) 98.9 ?F (37.2 ?C)  ?TempSrc: Oral Oral Oral   ?SpO2: 100% 99% (!) 85%   ?Weight:      ?Height:      ? ? ?  Intake/Output Summary (Last 24 hours) at 11/19/2021 1410 ?Last data filed at 11/19/2021 0300 ?Gross per 24 hour  ?Intake --  ?Output 1174 ml  ?Net  -1174 ml  ? ?Filed Weights  ? 11/17/21 1950 11/17/21 2128 11/18/21 1313  ?Weight: 64 kg 64 kg 64 kg  ? ? ?Examination: ? ?General exam: Appears anxious .  Appropriately in distress with pain.  Sedated after Dilaudid.  ?It hurts to examine him. ?Respiratory system: No added sounds. ?Cardiovascular system: S1 & S2 heard, RRR.  ?Gastrointestinal system: Abdomen is nondistended, soft and nontender. No organomegaly or masses felt. Normal bowel sounds heard.  Does have tenderness on suprapubic, pelvic exam. ?Central nervous system: Alert and oriented. No focal neurological deficits.  Unable to move lower extremities with pain. ?Left arm on posterior splint, painful swelling of the left fingers. ?Pelvic compression painful. ?Both lower extremities are very painful, right knee is swollen and painful.  ? ? ?Data Reviewed: I have personally reviewed following labs and imaging studies ? ?CBC: ?Recent Labs  ?Lab 11/17/21 ?1634 11/17/21 ?1707 11/18/21 ?0421 11/19/21 ?1250  ?WBC 12.2*  --  10.7* 10.1  ?NEUTROABS  --   --   --  7.3  ?HGB 10.7* 11.2* 8.4* 7.8*  ?HCT 32.5* 33.0* 26.1* 24.3*  ?MCV 95.3  --  97.4 98.8  ?PLT 172  --  154 138*  ? ?Basic Metabolic Panel: ?Recent Labs  ?Lab 11/17/21 ?1634 11/17/21 ?1707 11/18/21 ?0421 11/19/21 ?1250  ?NA 136 135 134* 136  ?K 4.8 4.7 5.6* 5.3*  ?CL 93* 98 93* 95*  ?CO2 24  --  23 22  ?GLUCOSE 229* 229* 174* 180*  ?BUN 75* 75* 83* 71*  ?CREATININE 10.78* 11.90* 11.33* 11.31*  ?CALCIUM 9.2  --  8.6* 9.4  ?PHOS  --   --  6.3*  --   ? ?GFR: ?Estimated Creatinine Clearance: 6.8 mL/min (A) (by C-G formula based on SCr of 11.31 mg/dL (H)). ?Liver Function Tests: ?Recent Labs  ?Lab 11/17/21 ?1634  ?AST 19  ?ALT 16  ?ALKPHOS 289*  ?BILITOT 0.7  ?PROT 7.3  ?ALBUMIN 3.6  ? ?No results for input(s): LIPASE, AMYLASE in the last 168 hours. ?No results for input(s): AMMONIA in the last 168 hours. ?Coagulation Profile: ?Recent Labs  ?Lab 11/17/21 ?1634  ?INR 1.1  ? ?Cardiac Enzymes: ?No results for  input(s): CKTOTAL, CKMB, CKMBINDEX, TROPONINI in the last 168 hours. ?BNP (last 3 results) ?No results for input(s): PROBNP in the last 8760 hours. ?HbA1C: ?Recent Labs  ?  11/18/21 ?0421  ?HGBA1C 7.0*  ? ?CBG: ?Recent Labs  ?Lab 11/19/21 ?0008 11/19/21 ?3500 11/19/21 ?9381 11/19/21 ?1147 11/19/21 ?1407  ?GLUCAP 167* 156* 178* 138* 182*  ? ?Lipid Profile: ?No results for input(s): CHOL, HDL, LDLCALC, TRIG, CHOLHDL, LDLDIRECT in the last 72 hours. ?Thyroid Function Tests: ?No results for input(s): TSH, T4TOTAL, FREET4, T3FREE, THYROIDAB in the last 72 hours. ?Anemia Panel: ?No results for input(s): VITAMINB12, FOLATE, FERRITIN, TIBC, IRON, RETICCTPCT in the last 72 hours. ?Sepsis Labs: ?Recent Labs  ?Lab 11/17/21 ?1634 11/18/21 ?0421  ?LATICACIDVEN 1.2 1.1  ? ? ?Recent Results (from the past 240 hour(s))  ?Resp Panel by RT-PCR (Flu A&B, Covid) Nasopharyngeal Swab     Status: None  ? Collection Time: 11/17/21  4:34 PM  ? Specimen: Nasopharyngeal Swab; Nasopharyngeal(NP) swabs in vial transport medium  ?Result Value Ref Range Status  ? SARS Coronavirus 2 by RT PCR NEGATIVE NEGATIVE Final  ?  Comment: (NOTE) ?SARS-CoV-2 target nucleic acids  are NOT DETECTED. ? ?The SARS-CoV-2 RNA is generally detectable in upper respiratory ?specimens during the acute phase of infection. The lowest ?concentration of SARS-CoV-2 viral copies this assay can detect is ?138 copies/mL. A negative result does not preclude SARS-Cov-2 ?infection and should not be used as the sole basis for treatment or ?other patient management decisions. A negative result may occur with  ?improper specimen collection/handling, submission of specimen other ?than nasopharyngeal swab, presence of viral mutation(s) within the ?areas targeted by this assay, and inadequate number of viral ?copies(<138 copies/mL). A negative result must be combined with ?clinical observations, patient history, and epidemiological ?information. The expected result is Negative. ? ?Fact  Sheet for Patients:  ?EntrepreneurPulse.com.au ? ?Fact Sheet for Healthcare Providers:  ?IncredibleEmployment.be ? ?This test is no t yet approved or cleared by the [place]

## 2021-11-19 NOTE — H&P (View-Only) (Signed)
Reason for Consult:Polytrauma ?Referring Physician: Eduard Santana ?Time called: 0730 ?Time at bedside: 0920 ? ? ?Blake Santana is an 51 y.o. male.  ?HPI: Blake Santana was up on a ladder doing some work at home when he fell. He fell about 20 feet. He was brought to the ED as a level 2 trauma activation. Workup showed multiple fractures and orthopedic surgery was consulted. Due to the complex nature and numerous fxs orthopedic trauma consultation was requested.  ? ?Past Medical History:  ?Diagnosis Date  ? Acute respiratory failure with hypoxia ([place]) 09/07/2018  ? Anemia of chronic kidney failure 03/11/2017  ? Cataracts, bilateral 2019  ? Chest pain 09/01/2018  ? CHF (congestive heart failure) ([place])   ? Chronic kidney disease 03/11/2017  ? Diabetes mellitus without complication ([place]) 2458  ? Edema 01/14/2017  ? Headache   ? Hypercholesterolemia   ? Hypertension 03/11/2017  ? Hypertensive retinopathy of both eyes 2019  ? Iron deficiency anemia 06/18/2016  ? Pneumonia 09/07/2018  ? Proliferative diabetic retinopathy with macular edema ([place]) 2019  ? Retinal hemorrhage, bilateral and left vitreous hemorrhage.  Being treated with Lucentis.  Dr. Iona Santana  ? RSV (respiratory syncytial virus infection) 08/2017  ? ? ?Past Surgical History:  ?Procedure Laterality Date  ? A/V FISTULAGRAM Left 01/19/2018  ? Procedure: A/V FISTULAGRAM - Left AV;  Surgeon: Blake Sandy, MD;  Location: [facility];  Service: Cardiovascular;  Laterality: Left;  ? AV FISTULA PLACEMENT Left 08/09/2017  ? Procedure: LEFT RADIOCEPHALIC ARTERIOVENOUS (AV) FISTULA CREATION;  Surgeon: Blake Posner, MD;  [facility];  Service: Vascular;  Laterality: Left;  ? AV FISTULA PLACEMENT Left 02/02/2018  ? Procedure: ARTERIOVENOUS (AV) FISTULA CREATION LEFT UPPER EXTREMITY;  Surgeon: Blake Posner, MD;  Location: [facility];  Service: Vascular;  Laterality: Left;  ? INSERTION OF DIALYSIS CATHETER  08/09/2017  ? INSERTION OF DIALYSIS CATHETER Right 08/09/2017  ?  Procedure: INSERTION OF DIALYSIS CATHETER RIGHT INTERNAL JUGULAR;  Surgeon: Blake Posner, MD;  [facility];  Service: Vascular;  Laterality: Right;  ? IR DIALY SHUNT INTRO NEEDLE/INTRACATH INITIAL W/IMG LEFT Left 07/10/2020  ? IR DIALY SHUNT INTRO NEEDLE/INTRACATH INITIAL W/IMG LEFT Left 04/14/2021  ? IR REMOVAL TUN CV CATH W/O FL  07/01/2018  ? LEFT HEART CATH AND CORONARY ANGIOGRAPHY N/A 09/05/2018  ? Procedure: LEFT HEART CATH AND CORONARY ANGIOGRAPHY;  Surgeon: Blake Booze, MD;  Location: [facility];  Service: Cardiovascular;  Laterality: N/A;  ? ? ?Family History  ?Problem Relation Age of Onset  ? Diabetes Mother   ? Kidney disease Mother   ?     renal failure--diabetes, cause of death  ? Hypertension Mother   ? Diabetes Father   ? Kidney disease Father   ?     Cause of death:  kidney failure form diabetes  ? Alcohol abuse Father   ? Diabetes Sister   ? ? ?Social History:  reports that he has quit smoking. His smoking use included cigarettes. He has never used smokeless tobacco. He reports that he does not currently use alcohol. He reports that he does not use drugs. ? ?Allergies: No Known Allergies ? ?Medications: I have reviewed the patient's current medications. ? ?Results for orders placed or performed during the hospital encounter of 11/17/21 (from the past 48 hour(s))  ?Comprehensive metabolic panel     Status: Abnormal  ? Collection Time: 11/17/21  4:34 PM  ?Result Value Ref Range  ? Sodium 136  135 - 145 mmol/L  ? Potassium 4.8 3.5 - 5.1 mmol/L  ? Chloride 93 (L) 98 - 111 mmol/L  ? CO2 24 22 - 32 mmol/L  ? Glucose, Bld 229 (H) 70 - 99 mg/dL  ?  Comment: Glucose reference range applies only to samples taken after fasting for at least 8 hours.  ? BUN 75 (H) 6 - 20 mg/dL  ? Creatinine, Ser 10.78 (H) 0.61 - 1.24 mg/dL  ? Calcium 9.2 8.9 - 10.3 mg/dL  ? Total Protein 7.3 6.5 - 8.1 g/dL  ? Albumin 3.6 3.5 - 5.0 g/dL  ? AST 19 15 - 41 U/L  ? ALT 16 0 - 44 U/L  ? Alkaline Phosphatase 289 (H) 38  - 126 U/L  ? Total Bilirubin 0.7 0.3 - 1.2 mg/dL  ? GFR, Estimated 5 (L) >60 mL/min  ?  Comment: (NOTE) ?Calculated using the CKD-EPI Creatinine Equation (2021) ?  ? Anion gap 19 (H) 5 - 15  ?  Comment: Performed at [facility], [place] [address], [place], [place] [address]  ?CBC     Status: Abnormal  ? Collection Time: 11/17/21  4:34 PM  ?Result Value Ref Range  ? WBC 12.2 (H) 4.0 - 10.5 K/uL  ? RBC 3.41 (L) 4.22 - 5.81 MIL/uL  ? Hemoglobin 10.7 (L) 13.0 - 17.0 g/dL  ? HCT 32.5 (L) 39.0 - 52.0 %  ? MCV 95.3 80.0 - 100.0 fL  ? MCH 31.4 26.0 - 34.0 pg  ? MCHC 32.9 30.0 - 36.0 g/dL  ? RDW 14.1 11.5 - 15.5 %  ? Platelets 172 150 - 400 K/uL  ? nRBC 0.0 0.0 - 0.2 %  ?  Comment: Performed at [facility], [place] [address], [place], [place] [address]  ?Ethanol     Status: None  ? Collection Time: 11/17/21  4:34 PM  ?Result Value Ref Range  ? Alcohol, Ethyl (B) <10 <10 mg/dL  ?  Comment: (NOTE) ?Lowest detectable limit for serum alcohol is 10 mg/dL. ? ?For medical purposes only. ?Performed at [facility], [place] [address], [place], [place] ?[address] ?  ?Lactic acid, plasma     Status: None  ? Collection Time: 11/17/21  4:34 PM  ?Result Value Ref Range  ? Lactic Acid, Venous 1.2 0.5 - 1.9 mmol/L  ?  Comment: Performed at [facility], [place] [address] [place], [place] [address]  ?Protime-INR     Status: None  ? Collection Time: 11/17/21  4:34 PM  ?Result Value Ref Range  ? Prothrombin Time 14.1 11.4 - 15.2 seconds  ? INR 1.1 0.8 - 1.2  ?  Comment: (NOTE) ?INR goal varies based on device and disease states. ?Performed at [facility], [place] [address] [place], [place] ?[address] ?  ?Resp Panel by RT-PCR (Flu A&B, Covid) Nasopharyngeal Swab     Status: None  ? Collection Time: 11/17/21  4:34 PM  ? Specimen: Nasopharyngeal Swab; Nasopharyngeal(NP) swabs in vial transport medium  ?Result Value Ref Range  ? SARS Coronavirus 2 by RT PCR NEGATIVE NEGATIVE  ?  Comment: (NOTE) ?SARS-CoV-2 target nucleic acids  are NOT DETECTED. ? ?The SARS-CoV-2 RNA is generally detectable in upper respiratory ?specimens during the acute phase of infection. The lowest ?concentration of SARS-CoV-2 viral copies this assay can detect is ?138 copies/mL. A negative result does not preclude SARS-Cov-2 ?infection and should not be used as the sole basis for treatment or ?other patient  management decisions. A negative result may occur with  ?improper specimen collection/handling, submission of specimen other ?than nasopharyngeal swab, presence of viral mutation(s) within the ?areas targeted by this assay, and inadequate number of viral ?copies(<138 copies/mL). A negative result must be combined with ?clinical observations, patient history, and epidemiological ?information. The expected result is Negative. ? ?Fact Sheet for Patients:  ?EntrepreneurPulse.com.au ? ?Fact Sheet for Healthcare Providers:  ?IncredibleEmployment.be ? ?This test is no t yet approved or cleared by the [place] FDA and  ?has been authorized for detection and/or diagnosis of SARS-CoV-2 by ?FDA under an Emergency Use Authorization (EUA). This EUA will remain  ?in effect (meaning this test can be used) for the duration of the ?COVID-19 declaration under Section 564(b)(1) of the Act, 21 ?U.S.C.section 360bbb-3(b)(1), unless the authorization is terminated  ?or revoked sooner.  ? ? ?  ? Influenza A by PCR NEGATIVE NEGATIVE  ? Influenza B by PCR NEGATIVE NEGATIVE  ?  Comment: (NOTE) ?The Xpert Xpress SARS-CoV-2/FLU/RSV plus assay is intended as an aid ?in the diagnosis of influenza from Nasopharyngeal swab specimens and ?should not be used as a sole basis for treatment. Nasal washings and ?aspirates are unacceptable for Xpert Xpress SARS-CoV-2/FLU/RSV ?testing. ? ?Fact Sheet for Patients: ?EntrepreneurPulse.com.au ? ?Fact Sheet for Healthcare Providers: ?IncredibleEmployment.be ? ?This test is not yet  approved or cleared by the [place] FDA and ?has been authorized for detection and/or diagnosis of SARS-CoV-2 by ?FDA under an Emergency Use Authorization (EUA). This EUA will remain ?in effect (mean

## 2021-11-19 NOTE — Progress Notes (Signed)
Spoke with Dr. Lissa Hoard, switching pt order of Dr. Doreatha Martin. Dr. Lissa Hoard wants to speak with medicine team prior to pt having surgery d/t pt condition/lab results. Dr. Lissa Hoard made Dr. Doreatha Martin aware. OR made aware.  ?

## 2021-11-19 NOTE — Anesthesia Preprocedure Evaluation (Addendum)
Anesthesia Evaluation  ?Patient identified by MRN, date of birth, ID band ?Patient awake ? ? ? ?Reviewed: ?Allergy & Precautions, NPO status , Patient's Chart, lab work & pertinent test results ? ?Airway ?Mallampati: II ? ?TM Distance: >3 FB ?Neck ROM: Full ? ? ? Dental ? ?(+) Loose,  ?  ?Pulmonary ?former smoker,  ?  ?Pulmonary exam normal ? ? ? ? ? ? ? Cardiovascular ?hypertension, Pt. on medications and Pt. on home beta blockers ?+CHF  ?Normal cardiovascular exam ? ?Echo 2020 ??1. The left ventricle has low normal systolic function of 63-84%. The cavity size is normal. There is moderate left ventricular wall thickness. Echo evidence of Indeterminate diastolic filling patterns.  ??2. Normal left atrial size.  ??3. Normal right atrial size.  ??4. Trivial pericardial effusion, as described above.  ??5. The mitral valve Thickened. There is mild thickening. Regurgitation is mild by color flow Doppler.  ??6. Normal tricuspid valve.  ??7. Tricuspid regurgitation is mild.  ??8. The aortic valve tricuspid. There is mild thickening of the aortic valve.  ??9. No atrial level shunt detected by color flow Doppler.  ?  ?Neuro/Psych ? Headaches,   ? GI/Hepatic ?  ?Endo/Other  ?diabetes, Oral Hypoglycemic Agents ? Renal/GU ?ESRF and DialysisRenal disease  ? ?  ?Musculoskeletal ? ? Abdominal ?  ?Peds ? Hematology ? ?(+) Blood dyscrasia, anemia , Thrombocytopenia    ?Anesthesia Other Findings ?left elbow, tibial plateau, and left ankle fractures ? Reproductive/Obstetrics ? ?  ? ? ? ? ? ? ? ? ? ? ? ? ? ?  ?  ? ? ? ? ? ? ?Anesthesia Physical ?Anesthesia Plan ? ?ASA: 3 ? ?Anesthesia Plan: General  ? ?Post-op Pain Management:   ? ?Induction: Intravenous ? ?PONV Risk Score and Plan: 3 and Ondansetron, Dexamethasone, Treatment may vary due to age or medical condition and Midazolam ? ?Airway Management Planned: Oral ETT ? ?Additional Equipment:  ? ?Intra-op Plan:  ? ?Post-operative Plan: Extubation in  OR ? ?Informed Consent: I have reviewed the patients History and Physical, chart, labs and discussed the procedure including the risks, benefits and alternatives for the proposed anesthesia with the patient or authorized representative who has indicated his/her understanding and acceptance.  ? ? ? ?Dental advisory given and Interpreter used for interveiw ? ?Plan Discussed with: CRNA ? ?Anesthesia Plan Comments: ([place])  ? ? ? ? ?Anesthesia Quick Evaluation ? ?

## 2021-11-19 NOTE — Consult Note (Signed)
Reason for Consult:Polytrauma ?Referring Physician: Eduard Roux ?Time called: 0730 ?Time at bedside: 0920 ? ? ?Blake Santana is an 51 y.o. male.  ?HPI: Blake Santana was up on a ladder doing some work at home when he fell. He fell about 20 feet. He was brought to the ED as a level 2 trauma activation. Workup showed multiple fractures and orthopedic surgery was consulted. Due to the complex nature and numerous fxs orthopedic trauma consultation was requested.  ? ?Past Medical History:  ?Diagnosis Date  ? Acute respiratory failure with hypoxia ([place]) 09/07/2018  ? Anemia of chronic kidney failure 03/11/2017  ? Cataracts, bilateral 2019  ? Chest pain 09/01/2018  ? CHF (congestive heart failure) ([place])   ? Chronic kidney disease 03/11/2017  ? Diabetes mellitus without complication ([place]) 1638  ? Edema 01/14/2017  ? Headache   ? Hypercholesterolemia   ? Hypertension 03/11/2017  ? Hypertensive retinopathy of both eyes 2019  ? Iron deficiency anemia 06/18/2016  ? Pneumonia 09/07/2018  ? Proliferative diabetic retinopathy with macular edema ([place]) 2019  ? Retinal hemorrhage, bilateral and left vitreous hemorrhage.  Being treated with Lucentis.  Dr. Iona Hansen  ? RSV (respiratory syncytial virus infection) 08/2017  ? ? ?Past Surgical History:  ?Procedure Laterality Date  ? A/V FISTULAGRAM Left 01/19/2018  ? Procedure: A/V FISTULAGRAM - Left AV;  Surgeon: Waynetta Sandy, MD;  Location: [facility];  Service: Cardiovascular;  Laterality: Left;  ? AV FISTULA PLACEMENT Left 08/09/2017  ? Procedure: LEFT RADIOCEPHALIC ARTERIOVENOUS (AV) FISTULA CREATION;  Surgeon: [name], MD;  [facility];  Service: Vascular;  Laterality: Left;  ? AV FISTULA PLACEMENT Left 02/02/2018  ? Procedure: ARTERIOVENOUS (AV) FISTULA CREATION LEFT UPPER EXTREMITY;  Surgeon: [name], MD;  Location: [facility];  Service: Vascular;  Laterality: Left;  ? INSERTION OF DIALYSIS CATHETER  08/09/2017  ? INSERTION OF DIALYSIS CATHETER Right 08/09/2017  ?  Procedure: INSERTION OF DIALYSIS CATHETER RIGHT INTERNAL JUGULAR;  Surgeon: [name], MD;  [facility];  Service: Vascular;  Laterality: Right;  ? IR DIALY SHUNT INTRO NEEDLE/INTRACATH INITIAL W/IMG LEFT Left 07/10/2020  ? IR DIALY SHUNT INTRO NEEDLE/INTRACATH INITIAL W/IMG LEFT Left 04/14/2021  ? IR REMOVAL TUN CV CATH W/O FL  07/01/2018  ? LEFT HEART CATH AND CORONARY ANGIOGRAPHY N/A 09/05/2018  ? Procedure: LEFT HEART CATH AND CORONARY ANGIOGRAPHY;  Surgeon: [name], MD;  Location: [facility];  Service: Cardiovascular;  Laterality: N/A;  ? ? ?Family History  ?Problem Relation Age of Onset  ? Diabetes Mother   ? Kidney disease Mother   ?     renal failure--diabetes, cause of death  ? Hypertension Mother   ? Diabetes Father   ? Kidney disease Father   ?     Cause of death:  kidney failure form diabetes  ? Alcohol abuse Father   ? Diabetes Sister   ? ? ?Social History:  reports that he has quit smoking. His smoking use included cigarettes. He has never used smokeless tobacco. He reports that he does not currently use alcohol. He reports that he does not use drugs. ? ?Allergies: No Known Allergies ? ?Medications: I have reviewed the patient's current medications. ? ?Results for orders placed or performed during the hospital encounter of 11/17/21 (from the past 48 hour(s))  ?Comprehensive metabolic panel     Status: Abnormal  ? Collection Time: 11/17/21  4:34 PM  ?Result Value Ref Range  ? Sodium 136  135 - 145 mmol/L  ? Potassium 4.8 3.5 - 5.1 mmol/L  ? Chloride 93 (L) 98 - 111 mmol/L  ? CO2 24 22 - 32 mmol/L  ? Glucose, Bld 229 (H) 70 - 99 mg/dL  ?  Comment: Glucose reference range applies only to samples taken after fasting for at least 8 hours.  ? BUN 75 (H) 6 - 20 mg/dL  ? Creatinine, Ser 10.78 (H) 0.61 - 1.24 mg/dL  ? Calcium 9.2 8.9 - 10.3 mg/dL  ? Total Protein 7.3 6.5 - 8.1 g/dL  ? Albumin 3.6 3.5 - 5.0 g/dL  ? AST 19 15 - 41 U/L  ? ALT 16 0 - 44 U/L  ? Alkaline Phosphatase 289 (H) 38  - 126 U/L  ? Total Bilirubin 0.7 0.3 - 1.2 mg/dL  ? GFR, Estimated 5 (L) >60 mL/min  ?  Comment: (NOTE) ?Calculated using the CKD-EPI Creatinine Equation (2021) ?  ? Anion gap 19 (H) 5 - 15  ?  Comment: Performed at [facility], [place] [address] [place], [place] [address]  ?CBC     Status: Abnormal  ? Collection Time: 11/17/21  4:34 PM  ?Result Value Ref Range  ? WBC 12.2 (H) 4.0 - 10.5 K/uL  ? RBC 3.41 (L) 4.22 - 5.81 MIL/uL  ? Hemoglobin 10.7 (L) 13.0 - 17.0 g/dL  ? HCT 32.5 (L) 39.0 - 52.0 %  ? MCV 95.3 80.0 - 100.0 fL  ? MCH 31.4 26.0 - 34.0 pg  ? MCHC 32.9 30.0 - 36.0 g/dL  ? RDW 14.1 11.5 - 15.5 %  ? Platelets 172 150 - 400 K/uL  ? nRBC 0.0 0.0 - 0.2 %  ?  Comment: Performed at [facility], [place] [address] [place], [place] [address]  ?Ethanol     Status: None  ? Collection Time: 11/17/21  4:34 PM  ?Result Value Ref Range  ? Alcohol, Ethyl (B) <10 <10 mg/dL  ?  Comment: (NOTE) ?Lowest detectable limit for serum alcohol is 10 mg/dL. ? ?For medical purposes only. ?Performed at [facility], [place] [address], [place], [place] ?[address] ?  ?Lactic acid, plasma     Status: None  ? Collection Time: 11/17/21  4:34 PM  ?Result Value Ref Range  ? Lactic Acid, Venous 1.2 0.5 - 1.9 mmol/L  ?  Comment: Performed at [facility], [place] [address] [place], [place] [address]  ?Protime-INR     Status: None  ? Collection Time: 11/17/21  4:34 PM  ?Result Value Ref Range  ? Prothrombin Time 14.1 11.4 - 15.2 seconds  ? INR 1.1 0.8 - 1.2  ?  Comment: (NOTE) ?INR goal varies based on device and disease states. ?Performed at [facility], [place] [address] [place], [place] ?[address] ?  ?Resp Panel by RT-PCR (Flu A&B, Covid) Nasopharyngeal Swab     Status: None  ? Collection Time: 11/17/21  4:34 PM  ? Specimen: Nasopharyngeal Swab; Nasopharyngeal(NP) swabs in vial transport medium  ?Result Value Ref Range  ? SARS Coronavirus 2 by RT PCR NEGATIVE NEGATIVE  ?  Comment: (NOTE) ?SARS-CoV-2 target nucleic acids  are NOT DETECTED. ? ?The SARS-CoV-2 RNA is generally detectable in upper respiratory ?specimens during the acute phase of infection. The lowest ?concentration of SARS-CoV-2 viral copies this assay can detect is ?138 copies/mL. A negative result does not preclude SARS-Cov-2 ?infection and should not be used as the sole basis for treatment or ?other patient  management decisions. A negative result may occur with  ?improper specimen collection/handling, submission of specimen other ?than nasopharyngeal swab, presence of viral mutation(s) within the ?areas targeted by this assay, and inadequate number of viral ?copies(<138 copies/mL). A negative result must be combined with ?clinical observations, patient history, and epidemiological ?information. The expected result is Negative. ? ?Fact Sheet for Patients:  ?EntrepreneurPulse.com.au ? ?Fact Sheet for Healthcare Providers:  ?IncredibleEmployment.be ? ?This test is no t yet approved or cleared by the [place] FDA and  ?has been authorized for detection and/or diagnosis of SARS-CoV-2 by ?FDA under an Emergency Use Authorization (EUA). This EUA will remain  ?in effect (meaning this test can be used) for the duration of the ?COVID-19 declaration under Section 564(b)(1) of the Act, 21 ?U.S.C.section 360bbb-3(b)(1), unless the authorization is terminated  ?or revoked sooner.  ? ? ?  ? Influenza A by PCR NEGATIVE NEGATIVE  ? Influenza B by PCR NEGATIVE NEGATIVE  ?  Comment: (NOTE) ?The Xpert Xpress SARS-CoV-2/FLU/RSV plus assay is intended as an aid ?in the diagnosis of influenza from Nasopharyngeal swab specimens and ?should not be used as a sole basis for treatment. Nasal washings and ?aspirates are unacceptable for Xpert Xpress SARS-CoV-2/FLU/RSV ?testing. ? ?Fact Sheet for Patients: ?EntrepreneurPulse.com.au ? ?Fact Sheet for Healthcare Providers: ?IncredibleEmployment.be ? ?This test is not yet  approved or cleared by the [place] FDA and ?has been authorized for detection and/or diagnosis of SARS-CoV-2 by ?FDA under an Emergency Use Authorization (EUA). This EUA will remain ?in effect (mean

## 2021-11-19 NOTE — Plan of Care (Signed)

## 2021-11-19 NOTE — Progress Notes (Signed)
?[facility] ?Progress Note  ? ?Subjective:  Seen in room. Ongoing ankle pain. No CP/dyspnea. HD ended early yesterday with hypotension/drowsiness. Able to speak appropriately in Spanish today. To OR today for ORIF of L distal radial Fx, L ankle Fx, R tibial plateau Fx, per notes. ? ?Objective ?Vitals:  ? 11/18/21 1833 11/18/21 2019 11/19/21 0400 11/19/21 0848  ?BP: (!) 173/84 115/62 138/65 128/63  ?Pulse: 95 84 93   ?Resp: '17 15 15 16  '$ ?Temp: 98.2 ?F (36.8 ?C) 98.2 ?F (36.8 ?C) 98.3 ?F (36.8 ?C) 98.9 ?F (37.2 ?C)  ?TempSrc: Oral Oral Oral   ?SpO2: 100% 99% (!) 85%   ?Weight:      ?Height:      ? ?Physical Exam ?General: Injured man, NAD. In C-collar, room air. ?Heart: RRR; no murmur ?Lungs: CTA anteriorly ?Abdomen: soft, non-tender ?Extremities: No LE edema, BLE/LUE in cast and/or slings ?Dialysis Access: L AVF + thrill ? ?Additional Objective ?Labs: ?Basic Metabolic Panel: ?Recent Labs  ?Lab 11/17/21 ?1634 11/17/21 ?1707 11/18/21 ?0421  ?NA 136 135 134*  ?K 4.8 4.7 5.6*  ?CL 93* 98 93*  ?CO2 24  --  23  ?GLUCOSE 229* 229* 174*  ?BUN 75* 75* 83*  ?CREATININE 10.78* 11.90* 11.33*  ?CALCIUM 9.2  --  8.6*  ?PHOS  --   --  6.3*  ? ?Liver Function Tests: ?Recent Labs  ?Lab 11/17/21 ?1634  ?AST 19  ?ALT 16  ?ALKPHOS 289*  ?BILITOT 0.7  ?PROT 7.3  ?ALBUMIN 3.6  ? ?CBC: ?Recent Labs  ?Lab 11/17/21 ?1634 11/17/21 ?1707 11/18/21 ?0421  ?WBC 12.2*  --  10.7*  ?HGB 10.7* 11.2* 8.4*  ?HCT 32.5* 33.0* 26.1*  ?MCV 95.3  --  97.4  ?PLT 172  --  154  ? ?Studies/Results: ?DG Elbow 2 Views Left ? ?Result Date: 11/17/2021 ?CLINICAL DATA:  Left elbow dislocation.  Post reduction imaging. EXAM: LEFT ELBOW - 2 VIEW COMPARISON:  8:28 p.m., 5:52 p.m. FINDINGS: Single-view frontal radiograph of the left elbow demonstrates normal alignment of the ulnohumeral and radiocapitellar articulation on this single view examination. Extensive surrounding soft tissue swelling is noted. Vascular calcifications are noted. IMPRESSION:  Normal alignment on this single view examination. Normal alignment when correlated with prior radiograph of 8:28 p.m. Electronically Signed   By: Fidela Salisbury M.D.   On: 11/17/2021 23:01  ? ?DG Elbow 2 Views Left ? ?Result Date: 11/17/2021 ?CLINICAL DATA:  Left elbow dislocation status post reduction EXAM: LEFT ELBOW - 2 VIEW COMPARISON:  Same day radiographs FINDINGS: Single lateral radiograph of the elbow obtained status post reduction. Alignment appears anatomic on single view. Known discrete fracture. Diffuse soft tissue swelling. Overlying splint. IMPRESSION: Interval reduction of elbow dislocation with anatomic alignment on single view. Electronically Signed   By: Davina Poke D.O.   On: 11/17/2021 20:35  ? ?DG Elbow 2 Views Left ? ?Result Date: 11/17/2021 ?CLINICAL DATA:  Fall, left elbow deformity EXAM: LEFT ELBOW - 2 VIEW COMPARISON:  None. FINDINGS: Posterior dislocation of the radial head and proximal ulna at the left elbow joint with surrounding soft tissue swelling. No discrete fracture. No focal osseous lesions. Surgical clips anterior to the left elbow. IMPRESSION: Posterior dislocation of the radial head and proximal ulna at the left elbow joint. No discrete fracture on these views. Post reduction radiographs advised. Electronically Signed   By: Ilona Sorrel M.D.   On: 11/17/2021 18:35  ? ?DG Forearm Left ? ?Result Date: 11/17/2021 ?CLINICAL DATA:  Fall  EXAM: LEFT FOREARM - 2 VIEW COMPARISON:  None. FINDINGS: Comminuted nondisplaced intra-articular mildly impacted left distal radius fracture. No additional fracture. No dislocation at the left wrist. Posterior dislocation of the radial head and proximal ulna at the left elbow. No suspicious focal osseous lesions. Soft tissue swelling at the left wrist and left elbow. Vascular calcifications throughout the soft tissues. Surgical clip in the radial distal left forearm soft tissues. IMPRESSION: 1. Comminuted nondisplaced intra-articular mildly  impacted left distal radius fracture. 2. Posterior dislocation of the radial head and proximal ulna at the left elbow. Electronically Signed   By: Ilona Sorrel M.D.   On: 11/17/2021 18:38  ? ?DG Wrist Complete Left ? ?Result Date: 11/17/2021 ?CLINICAL DATA:  Fall EXAM: LEFT WRIST - COMPLETE 3+ VIEW COMPARISON:  None. FINDINGS: Comminuted nondisplaced mildly impacted intra-articular left distal radius fracture. Nondisplaced acute left ulnar styloid fracture. No left wrist dislocation. No suspicious focal osseous lesions. Diffuse mild left wrist soft tissue swelling. Vascular calcifications throughout the soft tissues. Surgical clip in the radial distal left forearm soft tissues. IMPRESSION: 1. Comminuted nondisplaced mildly impacted intra-articular left distal radius fracture. 2. Nondisplaced acute left ulnar styloid fracture. Electronically Signed   By: Ilona Sorrel M.D.   On: 11/17/2021 18:39  ? ?DG Ankle Complete Right ? ?Result Date: 11/17/2021 ?CLINICAL DATA:  Fall EXAM: RIGHT ANKLE - COMPLETE 3+ VIEW COMPARISON:  None. FINDINGS: No right ankle fracture or subluxation. No suspicious focal osseous lesions. Vascular calcifications throughout the soft tissues. No radiopaque foreign bodies. Apparent fracture at the base of the proximal phalanx in the right fifth toe on the lateral view, incompletely characterized on these radiographs. IMPRESSION: 1. No right ankle fracture or subluxation. 2. Apparent fracture at the base of the proximal phalanx in the right fifth toe on the lateral view, incompletely characterized on these radiographs. Suggest dedicated right foot or right fifth toe radiographs. Electronically Signed   By: Ilona Sorrel M.D.   On: 11/17/2021 18:41  ? ?CT HEAD WO CONTRAST ? ?Result Date: 11/17/2021 ?CLINICAL DATA:  Head trauma, moderate-severe EXAM: CT HEAD WITHOUT CONTRAST TECHNIQUE: Contiguous axial images were obtained from the base of the skull through the vertex without intravenous contrast.  RADIATION DOSE REDUCTION: This exam was performed according to the departmental dose-optimization program which includes automated exposure control, adjustment of the mA and/or kV according to patient size and/or use of iterative reconstruction technique. COMPARISON:  09/16/2019 FINDINGS: Brain: No evidence of acute infarction, hemorrhage, hydrocephalus, extra-axial collection or mass lesion/mass effect. Vascular: Atherosclerotic calcifications involving the large vessels of the skull base. No unexpected hyperdense vessel. Skull: Normal. Negative for fracture or focal lesion. Sinuses/Orbits: Mild right maxillary sinus mucosal thickening. Other: None. IMPRESSION: No acute intracranial abnormality. Electronically Signed   By: Davina Poke D.O.   On: 11/17/2021 17:38  ? ?CT CERVICAL SPINE WO CONTRAST ? ?Result Date: 11/17/2021 ?CLINICAL DATA:  Trauma.  Fall from ladder. EXAM: CT CERVICAL SPINE WITHOUT CONTRAST TECHNIQUE: Multidetector CT imaging of the cervical spine was performed without intravenous contrast. Multiplanar CT image reconstructions were also generated. RADIATION DOSE REDUCTION: This exam was performed according to the departmental dose-optimization program which includes automated exposure control, adjustment of the mA and/or kV according to patient size and/or use of iterative reconstruction technique. COMPARISON:  Cervical spine CT 09/16/2019. FINDINGS: Alignment: Normal. Skull base and vertebrae: No acute fracture. No primary bone lesion or focal pathologic process. Soft tissues and spinal canal: No prevertebral fluid or swelling. No visible canal hematoma.  Disc levels: There is new moderate endplate abnormality at C6-C7 with sclerosis. Minimal disc space height loss. No significant central canal or neural foraminal stenosis at any level. Upper chest: Negative. Other: None. IMPRESSION: 1. No acute fracture or traumatic subluxation of the cervical spine. 2. New moderate endplate irregularity at  C6-C7 may be degenerative in nature. Correlate clinically for infection. Electronically Signed   By: Ronney Asters M.D.   On: 11/17/2021 17:36  ? ?CT Knee Right Wo Contrast ? ?Result Date: 11/17/2021 ?CLINICAL DATA:  Fall fr

## 2021-11-20 ENCOUNTER — Encounter (HOSPITAL_COMMUNITY)
Admission: EM | Disposition: A | Discharge status: Home Health | Payer: Self-pay | Source: Home / Self Care | Attending: Internal Medicine

## 2021-11-20 ENCOUNTER — Inpatient Hospital Stay (HOSPITAL_COMMUNITY): Payer: Medicaid Other

## 2021-11-20 ENCOUNTER — Inpatient Hospital Stay (HOSPITAL_COMMUNITY): Payer: Medicaid Other | Admitting: Anesthesiology

## 2021-11-20 ENCOUNTER — Encounter (HOSPITAL_COMMUNITY): Payer: Self-pay | Admitting: Internal Medicine

## 2021-11-20 ENCOUNTER — Other Ambulatory Visit: Payer: Self-pay

## 2021-11-20 DIAGNOSIS — S82141A Displaced bicondylar fracture of right tibia, initial encounter for closed fracture: Secondary | ICD-10-CM

## 2021-11-20 DIAGNOSIS — S53105A Unspecified dislocation of left ulnohumeral joint, initial encounter: Secondary | ICD-10-CM

## 2021-11-20 DIAGNOSIS — S82872A Displaced pilon fracture of left tibia, initial encounter for closed fracture: Secondary | ICD-10-CM

## 2021-11-20 DIAGNOSIS — S52572A Other intraarticular fracture of lower end of left radius, initial encounter for closed fracture: Secondary | ICD-10-CM

## 2021-11-20 HISTORY — PX: ORIF ANKLE FRACTURE: SHX5408

## 2021-11-20 HISTORY — PX: ORIF TIBIA PLATEAU: SHX2132

## 2021-11-20 HISTORY — PX: OPEN REDUCTION INTERNAL FIXATION (ORIF) DISTAL RADIAL FRACTURE: SHX5989

## 2021-11-20 LAB — RENAL FUNCTION PANEL
Albumin: 3.1 g/dL — ABNORMAL LOW (ref 3.5–5.0)
Anion gap: 20 — ABNORMAL HIGH (ref 5–15)
BUN: 94 mg/dL — ABNORMAL HIGH (ref 6–20)
CO2: 23 mmol/L (ref 22–32)
Calcium: 9.4 mg/dL (ref 8.9–10.3)
Chloride: 94 mmol/L — ABNORMAL LOW (ref 98–111)
Creatinine, Ser: 13.24 mg/dL — ABNORMAL HIGH (ref 0.61–1.24)
GFR, Estimated: 4 mL/min — ABNORMAL LOW (ref >60)
Glucose, Bld: 145 mg/dL — ABNORMAL HIGH (ref 70–99)
Phosphorus: 9.1 mg/dL — ABNORMAL HIGH (ref 2.5–4.6)
Potassium: 5.8 mmol/L — ABNORMAL HIGH (ref 3.5–5.1)
Sodium: 137 mmol/L (ref 135–145)

## 2021-11-20 LAB — GLUCOSE, CAPILLARY
Glucose-Capillary: 157 mg/dL — ABNORMAL HIGH (ref 70–99)
Glucose-Capillary: 145 mg/dL — ABNORMAL HIGH (ref 70–99)
Glucose-Capillary: 94 mg/dL (ref 70–99)
Glucose-Capillary: 108 mg/dL — ABNORMAL HIGH (ref 70–99)
Glucose-Capillary: 124 mg/dL — ABNORMAL HIGH (ref 70–99)
Glucose-Capillary: 164 mg/dL — ABNORMAL HIGH (ref 70–99)

## 2021-11-20 LAB — CBC WITH DIFFERENTIAL/PLATELET
Abs Immature Granulocytes: 0.06 10*3/uL (ref 0.00–0.07)
Basophils Absolute: 0 10*3/uL (ref 0.0–0.1)
Basophils Relative: 0 %
Eosinophils Absolute: 0.1 10*3/uL (ref 0.0–0.5)
Eosinophils Relative: 1 %
HCT: 25.4 % — ABNORMAL LOW (ref 39.0–52.0)
Hemoglobin: 8.2 g/dL — ABNORMAL LOW (ref 13.0–17.0)
Immature Granulocytes: 1 %
Lymphocytes Relative: 15 %
Lymphs Abs: 1.4 10*3/uL (ref 0.7–4.0)
MCH: 30.7 pg (ref 26.0–34.0)
MCHC: 32.3 g/dL (ref 30.0–36.0)
MCV: 95.1 fL (ref 80.0–100.0)
Monocytes Absolute: 1 10*3/uL (ref 0.1–1.0)
Monocytes Relative: 10 %
Neutro Abs: 6.8 10*3/uL (ref 1.7–7.7)
Neutrophils Relative %: 73 %
Platelets: 145 10*3/uL — ABNORMAL LOW (ref 150–400)
RBC: 2.67 MIL/uL — ABNORMAL LOW (ref 4.22–5.81)
RDW: 15.3 % (ref 11.5–15.5)
WBC: 9.3 10*3/uL (ref 4.0–10.5)
nRBC: 0 % (ref 0.0–0.2)

## 2021-11-20 LAB — POCT I-STAT, CHEM 8
BUN: 26 mg/dL — ABNORMAL HIGH (ref 6–20)
Calcium, Ion: 1.16 mmol/L (ref 1.15–1.40)
Chloride: 96 mmol/L — ABNORMAL LOW (ref 98–111)
Creatinine, Ser: 5.8 mg/dL — ABNORMAL HIGH (ref 0.61–1.24)
Glucose, Bld: 110 mg/dL — ABNORMAL HIGH (ref 70–99)
HCT: 33 % — ABNORMAL LOW (ref 39.0–52.0)
Hemoglobin: 11.2 g/dL — ABNORMAL LOW (ref 13.0–17.0)
Potassium: 3.9 mmol/L (ref 3.5–5.1)
Sodium: 134 mmol/L — ABNORMAL LOW (ref 135–145)
TCO2: 26 mmol/L (ref 22–32)

## 2021-11-20 LAB — HEMOGLOBIN AND HEMATOCRIT, BLOOD
HCT: 25.7 % — ABNORMAL LOW (ref 39.0–52.0)
Hemoglobin: 8.5 g/dL — ABNORMAL LOW (ref 13.0–17.0)

## 2021-11-20 LAB — PREPARE RBC (CROSSMATCH)

## 2021-11-20 LAB — VITAMIN D 25 HYDROXY (VIT D DEFICIENCY, FRACTURES): Vit D, 25-Hydroxy: 21.04 ng/mL — ABNORMAL LOW (ref 30–100)

## 2021-11-20 SURGERY — OPEN REDUCTION INTERNAL FIXATION (ORIF) TIBIAL PLATEAU
Anesthesia: General | Site: Ankle | Laterality: Right

## 2021-11-20 MED ORDER — ORAL CARE MOUTH RINSE: 15.0000 mL | Freq: Once | OROMUCOSAL | Status: AC

## 2021-11-20 MED ORDER — CEFAZOLIN SODIUM-DEXTROSE 2-4 GM/100ML-% IV SOLN: 2.0000 g | Freq: Three times a day (TID) | INTRAVENOUS | Status: DC

## 2021-11-20 MED ORDER — LABETALOL HCL 5 MG/ML IV SOLN
INTRAVENOUS | Status: AC
  Administered 2021-11-20: 5 mg via INTRAVENOUS
  Filled 2021-11-20: qty 4

## 2021-11-20 MED ORDER — INSULIN ASPART 100 UNIT/ML IJ SOLN: 0.0000 [IU] | INTRAMUSCULAR | Status: DC | PRN

## 2021-11-20 MED ORDER — HYDROMORPHONE HCL 1 MG/ML IJ SOLN
INTRAMUSCULAR | Status: AC
  Filled 2021-11-20: qty 0.5

## 2021-11-20 MED ORDER — METOPROLOL TARTRATE 12.5 MG HALF TABLET
ORAL_TABLET | ORAL | Status: AC
  Administered 2021-11-20: 12.5 mg via ORAL
  Filled 2021-11-20: qty 1

## 2021-11-20 MED ORDER — VANCOMYCIN HCL 1000 MG IV SOLR
INTRAVENOUS | Status: AC
  Filled 2021-11-20: qty 60

## 2021-11-20 MED ORDER — DIPHENHYDRAMINE HCL 12.5 MG/5ML PO ELIX
12.5000 mg | ORAL_SOLUTION | ORAL | Status: DC | PRN
  Administered 2021-11-24: 25 mg via ORAL
  Filled 2021-11-20: qty 10

## 2021-11-20 MED ORDER — METHOCARBAMOL 500 MG PO TABS
500.0000 mg | ORAL_TABLET | Freq: Four times a day (QID) | ORAL | Status: DC | PRN
  Administered 2021-11-20 – 2021-11-26 (×7): 500 mg via ORAL
  Filled 2021-11-20 (×7): qty 1

## 2021-11-20 MED ORDER — HYDROMORPHONE HCL 1 MG/ML IJ SOLN
INTRAMUSCULAR | Status: DC | PRN
  Administered 2021-11-20 (×2): .5 mg via INTRAVENOUS

## 2021-11-20 MED ORDER — METOPROLOL TARTRATE 12.5 MG HALF TABLET: 12.5000 mg | ORAL_TABLET | Freq: Once | ORAL | Status: AC

## 2021-11-20 MED ORDER — DOCUSATE SODIUM 100 MG PO CAPS
100.0000 mg | ORAL_CAPSULE | Freq: Two times a day (BID) | ORAL | Status: DC
  Administered 2021-11-20 – 2021-11-27 (×13): 100 mg via ORAL
  Filled 2021-11-20 (×13): qty 1

## 2021-11-20 MED ORDER — CISATRACURIUM BESYLATE 20 MG/10ML IV SOLN
INTRAVENOUS | Status: AC
  Filled 2021-11-20: qty 10

## 2021-11-20 MED ORDER — LABETALOL HCL 5 MG/ML IV SOLN
5.0000 mg | INTRAVENOUS | Status: DC | PRN
  Administered 2021-11-20: 5 mg via INTRAVENOUS

## 2021-11-20 MED ORDER — FENTANYL CITRATE (PF) 250 MCG/5ML IJ SOLN
INTRAMUSCULAR | Status: AC
  Filled 2021-11-20: qty 5

## 2021-11-20 MED ORDER — VANCOMYCIN HCL 1000 MG IV SOLR
INTRAVENOUS | Status: AC
  Filled 2021-11-20: qty 20

## 2021-11-20 MED ORDER — NEOSTIGMINE METHYLSULFATE 10 MG/10ML IV SOLN
INTRAVENOUS | Status: DC | PRN
  Administered 2021-11-20: 5 mg via INTRAVENOUS

## 2021-11-20 MED ORDER — PROPOFOL 10 MG/ML IV BOLUS
INTRAVENOUS | Status: AC
  Filled 2021-11-20: qty 20

## 2021-11-20 MED ORDER — FENTANYL CITRATE (PF) 100 MCG/2ML IJ SOLN: 25.0000 ug | INTRAMUSCULAR | Status: DC | PRN

## 2021-11-20 MED ORDER — CEFAZOLIN SODIUM-DEXTROSE 2-3 GM-%(50ML) IV SOLR
INTRAVENOUS | Status: DC | PRN
  Administered 2021-11-20: 2 g via INTRAVENOUS

## 2021-11-20 MED ORDER — HYDROMORPHONE HCL 1 MG/ML IJ SOLN
0.5000 mg | INTRAMUSCULAR | Status: DC | PRN
  Administered 2021-11-20 – 2021-11-26 (×3): 1 mg via INTRAVENOUS
  Filled 2021-11-20 (×3): qty 1

## 2021-11-20 MED ORDER — POLYETHYLENE GLYCOL 3350 17 G PO PACK
17.0000 g | PACK | Freq: Every day | ORAL | Status: DC | PRN
  Administered 2021-11-21 – 2021-11-26 (×3): 17 g via ORAL
  Filled 2021-11-20 (×3): qty 1

## 2021-11-20 MED ORDER — ACETAMINOPHEN 500 MG PO TABS
1000.0000 mg | ORAL_TABLET | Freq: Four times a day (QID) | ORAL | Status: AC
  Administered 2021-11-21 (×4): 1000 mg via ORAL
  Filled 2021-11-20 (×4): qty 2

## 2021-11-20 MED ORDER — METHOCARBAMOL 1000 MG/10ML IJ SOLN
500.0000 mg | Freq: Four times a day (QID) | INTRAVENOUS | Status: DC | PRN
  Filled 2021-11-20 (×4): qty 5

## 2021-11-20 MED ORDER — VANCOMYCIN HCL 1000 MG IV SOLR
INTRAVENOUS | Status: DC | PRN
  Administered 2021-11-20: 1000 mg

## 2021-11-20 MED ORDER — PROPOFOL 10 MG/ML IV BOLUS
INTRAVENOUS | Status: DC | PRN
  Administered 2021-11-20: 200 mg via INTRAVENOUS

## 2021-11-20 MED ORDER — HEPARIN SODIUM (PORCINE) 5000 UNIT/ML IJ SOLN
5000.0000 [IU] | Freq: Three times a day (TID) | INTRAMUSCULAR | Status: DC
  Administered 2021-11-20 – 2021-11-27 (×20): 5000 [IU] via SUBCUTANEOUS
  Filled 2021-11-20 (×20): qty 1

## 2021-11-20 MED ORDER — DEXAMETHASONE SODIUM PHOSPHATE 10 MG/ML IJ SOLN
INTRAMUSCULAR | Status: DC | PRN
  Administered 2021-11-20: 5 mg via INTRAVENOUS

## 2021-11-20 MED ORDER — OXYCODONE HCL 5 MG PO TABS
5.0000 mg | ORAL_TABLET | ORAL | Status: DC | PRN
  Administered 2021-11-21: 10 mg via ORAL
  Administered 2021-11-25: 5 mg via ORAL
  Filled 2021-11-20 (×3): qty 2

## 2021-11-20 MED ORDER — SODIUM CHLORIDE 0.9% IV SOLUTION: Freq: Once | INTRAVENOUS | Status: DC

## 2021-11-20 MED ORDER — FENTANYL CITRATE (PF) 250 MCG/5ML IJ SOLN
INTRAMUSCULAR | Status: DC | PRN
  Administered 2021-11-20 (×2): 50 ug via INTRAVENOUS
  Administered 2021-11-20: 150 ug via INTRAVENOUS

## 2021-11-20 MED ORDER — CHLORHEXIDINE GLUCONATE 0.12 % MT SOLN
15.0000 mL | Freq: Once | OROMUCOSAL | Status: AC
  Administered 2021-11-20: 15 mL via OROMUCOSAL
  Filled 2021-11-20: qty 15

## 2021-11-20 MED ORDER — GLYCOPYRROLATE PF 0.2 MG/ML IJ SOSY
PREFILLED_SYRINGE | INTRAMUSCULAR | Status: DC | PRN
  Administered 2021-11-20: .8 mg via INTRAVENOUS

## 2021-11-20 MED ORDER — ACETAMINOPHEN 10 MG/ML IV SOLN
INTRAVENOUS | Status: AC
  Filled 2021-11-20: qty 100

## 2021-11-20 MED ORDER — PROCHLORPERAZINE EDISYLATE 10 MG/2ML IJ SOLN
INTRAMUSCULAR | Status: AC
  Filled 2021-11-20: qty 2

## 2021-11-20 MED ORDER — ONDANSETRON HCL 4 MG/2ML IJ SOLN
INTRAMUSCULAR | Status: DC | PRN
  Administered 2021-11-20: 4 mg via INTRAVENOUS

## 2021-11-20 MED ORDER — PROCHLORPERAZINE EDISYLATE 10 MG/2ML IJ SOLN
10.0000 mg | Freq: Four times a day (QID) | INTRAMUSCULAR | Status: DC | PRN
  Administered 2021-11-20: 10 mg via INTRAVENOUS

## 2021-11-20 MED ORDER — OXYCODONE HCL 5 MG/5ML PO SOLN
5.0000 mg | Freq: Once | ORAL | Status: AC | PRN
  Administered 2021-11-20: 5 mg via ORAL

## 2021-11-20 MED ORDER — PHENYLEPHRINE HCL-NACL 20-0.9 MG/250ML-% IV SOLN
INTRAVENOUS | Status: DC | PRN
  Administered 2021-11-20: 40 ug/min via INTRAVENOUS

## 2021-11-20 MED ORDER — ROCURONIUM BROMIDE 10 MG/ML (PF) SYRINGE
PREFILLED_SYRINGE | INTRAVENOUS | Status: AC
  Filled 2021-11-20: qty 10

## 2021-11-20 MED ORDER — MIDAZOLAM HCL 2 MG/2ML IJ SOLN
INTRAMUSCULAR | Status: DC | PRN
  Administered 2021-11-20: 2 mg via INTRAVENOUS

## 2021-11-20 MED ORDER — ACETAMINOPHEN 10 MG/ML IV SOLN
1000.0000 mg | Freq: Once | INTRAVENOUS | Status: DC | PRN
  Administered 2021-11-20: 1000 mg via INTRAVENOUS

## 2021-11-20 MED ORDER — OXYCODONE HCL 5 MG PO TABS: 5.0000 mg | ORAL_TABLET | Freq: Once | ORAL | Status: AC | PRN

## 2021-11-20 MED ORDER — SODIUM CHLORIDE 0.9 % IV SOLN: INTRAVENOUS | Status: DC

## 2021-11-20 MED ORDER — MIDAZOLAM HCL 2 MG/2ML IJ SOLN
INTRAMUSCULAR | Status: AC
  Filled 2021-11-20: qty 2

## 2021-11-20 MED ORDER — OXYCODONE HCL 5 MG/5ML PO SOLN
ORAL | Status: AC
  Filled 2021-11-20: qty 5

## 2021-11-20 MED ORDER — LIDOCAINE 2% (20 MG/ML) 5 ML SYRINGE
INTRAMUSCULAR | Status: AC
  Filled 2021-11-20: qty 5

## 2021-11-20 MED ORDER — OXYCODONE HCL 5 MG PO TABS
10.0000 mg | ORAL_TABLET | ORAL | Status: DC | PRN
  Administered 2021-11-21 – 2021-11-22 (×6): 15 mg via ORAL
  Administered 2021-11-23: 10 mg via ORAL
  Administered 2021-11-23 (×4): 15 mg via ORAL
  Administered 2021-11-24 (×2): 10 mg via ORAL
  Administered 2021-11-25: 15 mg via ORAL
  Administered 2021-11-25: 10 mg via ORAL
  Administered 2021-11-25 – 2021-11-26 (×2): 15 mg via ORAL
  Administered 2021-11-26: 10 mg via ORAL
  Administered 2021-11-26: 15 mg via ORAL
  Filled 2021-11-20 (×10): qty 3
  Filled 2021-11-20: qty 2
  Filled 2021-11-20: qty 3
  Filled 2021-11-20 (×3): qty 2
  Filled 2021-11-20 (×3): qty 3

## 2021-11-20 MED ORDER — CISATRACURIUM BESYLATE (PF) 10 MG/5ML IV SOLN
INTRAVENOUS | Status: DC | PRN
  Administered 2021-11-20: 20 mg via INTRAVENOUS

## 2021-11-20 MED ORDER — 0.9 % SODIUM CHLORIDE (POUR BTL) OPTIME
TOPICAL | Status: DC | PRN
  Administered 2021-11-20: 1000 mL

## 2021-11-20 MED ORDER — LIDOCAINE 2% (20 MG/ML) 5 ML SYRINGE
INTRAMUSCULAR | Status: DC | PRN
  Administered 2021-11-20: 60 mg via INTRAVENOUS

## 2021-11-20 SURGICAL SUPPLY — 117 items
APL PRP STRL LF DISP 70% ISPRP (MISCELLANEOUS) ×9
BAG COUNTER SPONGE SURGICOUNT (BAG) ×4 IMPLANT
BAG SPNG CNTER NS LX DISP (BAG) ×3
BANDAGE ESMARK 6X9 LF (GAUZE/BANDAGES/DRESSINGS) ×3 IMPLANT
BIT DRILL 2.2 SS TIBIAL (BIT) ×1 IMPLANT
BIT DRILL CALIBR QC 2.8X250 (BIT) ×1 IMPLANT
BIT DRILL QC SFS 2.5X170 (BIT) ×1 IMPLANT
BLADE CLIPPER SURG (BLADE) ×1 IMPLANT
BLADE SURG 15 STRL LF DISP TIS (BLADE) ×3 IMPLANT
BLADE SURG 15 STRL SS (BLADE) ×4
BNDG CMPR 9X6 STRL LF SNTH (GAUZE/BANDAGES/DRESSINGS) ×3
BNDG CMPR MED 15X6 ELC VLCR LF (GAUZE/BANDAGES/DRESSINGS) ×6
BNDG COHESIVE 4X5 TAN STRL (GAUZE/BANDAGES/DRESSINGS) ×4 IMPLANT
BNDG ELASTIC 3X5.8 VLCR STR LF (GAUZE/BANDAGES/DRESSINGS) ×1 IMPLANT
BNDG ELASTIC 4X5.8 VLCR STR LF (GAUZE/BANDAGES/DRESSINGS) ×7 IMPLANT
BNDG ELASTIC 6X15 VLCR STRL LF (GAUZE/BANDAGES/DRESSINGS) ×2 IMPLANT
BNDG ELASTIC 6X5.8 VLCR STR LF (GAUZE/BANDAGES/DRESSINGS) ×5 IMPLANT
BNDG ESMARK 6X9 LF (GAUZE/BANDAGES/DRESSINGS) ×4
BNDG GAUZE ELAST 4 BULKY (GAUZE/BANDAGES/DRESSINGS) ×5 IMPLANT
BRUSH SCRUB EZ PLAIN DRY (MISCELLANEOUS) ×11 IMPLANT
CANISTER SUCT 3000ML PPV (MISCELLANEOUS) ×5 IMPLANT
CHLORAPREP W/TINT 26 (MISCELLANEOUS) ×9 IMPLANT
COVER SURGICAL LIGHT HANDLE (MISCELLANEOUS) ×5 IMPLANT
CUFF TOURN SGL QUICK 34 (TOURNIQUET CUFF) ×4
CUFF TRNQT CYL 34X4.125X (TOURNIQUET CUFF) ×4 IMPLANT
DRAPE C-ARM 42X72 X-RAY (DRAPES) ×5 IMPLANT
DRAPE C-ARMOR (DRAPES) ×4 IMPLANT
DRAPE ORTHO SPLIT 77X108 STRL (DRAPES) ×8
DRAPE SURG ORHT 6 SPLT 77X108 (DRAPES) ×6 IMPLANT
DRAPE U-SHAPE 47X51 STRL (DRAPES) ×4 IMPLANT
DRESSING MEPILEX FLEX 4X4 (GAUZE/BANDAGES/DRESSINGS) IMPLANT
DRSG ADAPTIC 3X8 NADH LF (GAUZE/BANDAGES/DRESSINGS) IMPLANT
DRSG MEPILEX BORDER 4X8 (GAUZE/BANDAGES/DRESSINGS) ×1 IMPLANT
DRSG MEPILEX FLEX 4X4 (GAUZE/BANDAGES/DRESSINGS) ×4
DRSG MEPITEL 4X7.2 (GAUZE/BANDAGES/DRESSINGS) ×2 IMPLANT
DRSG PAD ABDOMINAL 8X10 ST (GAUZE/BANDAGES/DRESSINGS) ×10 IMPLANT
ELECT REM PT RETURN 9FT ADLT (ELECTROSURGICAL) ×4
ELECTRODE REM PT RTRN 9FT ADLT (ELECTROSURGICAL) ×4 IMPLANT
GAUZE SPONGE 4X4 12PLY STRL (GAUZE/BANDAGES/DRESSINGS) ×5 IMPLANT
GAUZE SPONGE 4X4 12PLY STRL LF (GAUZE/BANDAGES/DRESSINGS) ×1 IMPLANT
GLOVE BIO SURGEON STRL SZ 6.5 (GLOVE) ×12 IMPLANT
GLOVE BIO SURGEON STRL SZ7.5 (GLOVE) ×16 IMPLANT
GLOVE BIOGEL PI IND STRL 6.5 (GLOVE) ×3 IMPLANT
GLOVE BIOGEL PI IND STRL 7.5 (GLOVE) ×4 IMPLANT
GLOVE BIOGEL PI INDICATOR 6.5 (GLOVE) ×1
GLOVE BIOGEL PI INDICATOR 7.5 (GLOVE) ×1
GOWN STRL REUS W/ TWL LRG LVL3 (GOWN DISPOSABLE) ×6 IMPLANT
GOWN STRL REUS W/TWL LRG LVL3 (GOWN DISPOSABLE) ×8
IMMOBILIZER KNEE 22 UNIV (SOFTGOODS) ×4 IMPLANT
K-WIRE 1.6 (WIRE) ×4
K-WIRE 1.6X150 (WIRE) ×4
K-WIRE FX5X1.6XNS BN SS (WIRE) ×3
KIT BASIN OR (CUSTOM PROCEDURE TRAY) ×4 IMPLANT
KIT TURNOVER KIT B (KITS) ×5 IMPLANT
KWIRE 1.6X150 (WIRE) IMPLANT
KWIRE FX5X1.6XNS BN SS (WIRE) IMPLANT
MANIFOLD NEPTUNE II (INSTRUMENTS) ×4 IMPLANT
NDL HYPO 21X1.5 SAFETY (NEEDLE) IMPLANT
NDL HYPO 25GX1X1/2 BEV (NEEDLE) ×3 IMPLANT
NDL SUT 6 .5 CRC .975X.05 MAYO (NEEDLE) ×3 IMPLANT
NEEDLE HYPO 21X1.5 SAFETY (NEEDLE) IMPLANT
NEEDLE HYPO 25GX1X1/2 BEV (NEEDLE) ×4 IMPLANT
NEEDLE MAYO TAPER (NEEDLE)
NS IRRIG 1000ML POUR BTL (IV SOLUTION) ×5 IMPLANT
PACK TOTAL JOINT (CUSTOM PROCEDURE TRAY) ×5 IMPLANT
PAD ARMBOARD 7.5X6 YLW CONV (MISCELLANEOUS) ×10 IMPLANT
PAD CAST 3X4 CTTN HI CHSV (CAST SUPPLIES) IMPLANT
PAD CAST 4YDX4 CTTN HI CHSV (CAST SUPPLIES) ×3 IMPLANT
PADDING CAST COTTON 3X4 STRL (CAST SUPPLIES) ×4
PADDING CAST COTTON 4X4 STRL (CAST SUPPLIES) ×12
PADDING CAST COTTON 6X4 STRL (CAST SUPPLIES) ×6 IMPLANT
PLATE NARROW DVR LEFT (Plate) ×1 IMPLANT
PLATE PROX 3.5 VA LCP LG 4H RT (Plate) ×1 IMPLANT
SCREW ACE CAN 4.0 42M (Screw) ×1 IMPLANT
SCREW ACE CAN 4.0 46M (Screw) ×1 IMPLANT
SCREW CORTEX 3.5 38MM (Screw) ×1 IMPLANT
SCREW CORTEX 3.5 45MM (Screw) ×1 IMPLANT
SCREW CORTEX 3.5 70MM SELF TAP (Screw) ×1 IMPLANT
SCREW CORTEX 3.5X75MM (Screw) ×1 IMPLANT
SCREW LOCK 14X2.7X 3 LD TPR (Screw) IMPLANT
SCREW LOCK 16X2.7X 3 LD TPR (Screw) IMPLANT
SCREW LOCK 20X2.7X 3 LD TPR (Screw) IMPLANT
SCREW LOCK 22X2.7X 3 LD TPR (Screw) IMPLANT
SCREW LOCK CORT ST 3.5X38 (Screw) IMPLANT
SCREW LOCKING 2.7X14 (Screw) ×4 IMPLANT
SCREW LOCKING 2.7X16 (Screw) ×12 IMPLANT
SCREW LOCKING 2.7X20MM (Screw) ×4 IMPLANT
SCREW LOCKING 2.7X22MM (Screw) ×16 IMPLANT
SCREW LOCKING VA 3.5X75MM (Screw) ×3 IMPLANT
SLING ARM FOAM STRAP LRG (SOFTGOODS) ×1 IMPLANT
SPLINT PLASTER CAST XFAST 5X30 (CAST SUPPLIES) IMPLANT
SPLINT PLASTER XFAST SET 5X30 (CAST SUPPLIES) ×2
SPONGE T-LAP 18X18 ~~LOC~~+RFID (SPONGE) IMPLANT
STAPLER VISISTAT 35W (STAPLE) ×4 IMPLANT
STRIP CLOSURE SKIN 1/2X4 (GAUZE/BANDAGES/DRESSINGS) ×1 IMPLANT
SUCTION FRAZIER HANDLE 10FR (MISCELLANEOUS) ×8
SUCTION TUBE FRAZIER 10FR DISP (MISCELLANEOUS) ×3 IMPLANT
SUT ETHILON 2 0 FS 18 (SUTURE) ×4 IMPLANT
SUT ETHILON 3 0 PS 1 (SUTURE) ×8 IMPLANT
SUT FIBERWIRE #2 38 T-5 BLUE (SUTURE)
SUT MNCRL AB 4-0 PS2 18 (SUTURE) ×1 IMPLANT
SUT PROLENE 0 CT (SUTURE) IMPLANT
SUT VIC AB 0 CT1 27 (SUTURE) ×4
SUT VIC AB 0 CT1 27XBRD ANBCTR (SUTURE) ×3 IMPLANT
SUT VIC AB 1 CT1 18XCR BRD 8 (SUTURE) IMPLANT
SUT VIC AB 1 CT1 27 (SUTURE) ×4
SUT VIC AB 1 CT1 27XBRD ANBCTR (SUTURE) ×3 IMPLANT
SUT VIC AB 1 CT1 8-18 (SUTURE) ×4
SUT VIC AB 2-0 CT1 27 (SUTURE) ×8
SUT VIC AB 2-0 CT1 TAPERPNT 27 (SUTURE) ×8 IMPLANT
SUTURE FIBERWR #2 38 T-5 BLUE (SUTURE) IMPLANT
SYR CONTROL 10ML LL (SYRINGE) ×5 IMPLANT
TOWEL GREEN STERILE (TOWEL DISPOSABLE) ×8 IMPLANT
TOWEL GREEN STERILE FF (TOWEL DISPOSABLE) ×5 IMPLANT
TRAY FOLEY MTR SLVR 16FR STAT (SET/KITS/TRAYS/PACK) IMPLANT
UNDERPAD 30X36 HEAVY ABSORB (UNDERPADS AND DIAPERS) ×6 IMPLANT
WATER STERILE IRR 1000ML POUR (IV SOLUTION) ×6 IMPLANT

## 2021-11-20 NOTE — Anesthesia Procedure Notes (Signed)
Arterial Line Insertion ?Start/End4/20/2023 2:25 PM, 11/20/2021 2:35 PM ?Performed by: Rande Brunt, CRNA ? Patient location: OR. ?Preanesthetic checklist: patient identified, IV checked, site marked, risks and benefits discussed, surgical consent, monitors and equipment checked, pre-op evaluation, timeout performed and anesthesia consent ?Lidocaine 1% used for infiltration ?Right, radial was placed ?Catheter size: 20 G ?Hand hygiene performed  and maximum sterile barriers used  ? ?Attempts: 2 ?Procedure performed without using ultrasound guided technique. ?Following insertion, dressing applied and Biopatch. ?Post procedure assessment: normal and unchanged ? ?Patient tolerated the procedure well with no immediate complications. ? ? ?

## 2021-11-20 NOTE — Progress Notes (Signed)
ANTICOAGULATION CONSULT NOTE ? ?Pharmacy Consult for heparin ?Indication: VTE prophylaxis ? ? ?Labs: ?Recent Labs  ?  11/18/21 ?0421 11/19/21 ?1250 11/20/21 ?6314 11/20/21 ?9702 11/20/21 ?1418  ?HGB 8.4* 7.8* 8.5* 8.2* 11.2*  ?HCT 26.1* 24.3* 25.7* 25.4* 33.0*  ?PLT 154 138*  --  145*  --   ?CREATININE 11.33* 11.31*  --  13.24* 5.80*  ? ? ?Assessment: ?25 yom with hx ESRD on HD presenting with polytrauma after falling off ladder, s/p ORIF 4/20. Pharmacy consulted to start heparin for VTE prophylaxis. Patient is not on anticoagulation PTA. Currently just on SCDs. Hg up to 11.8 s/p PRBC transfusion today. Plt stable 145. No active bleed issues reported. ? ?Goal of Therapy:  ?VTE prevention ?Monitor platelets by anticoagulation protocol: Yes ?  ?Plan:  ?Heparin 5000 units SQ q8h ?Monitor CBC, s/sx bleeding ? ? ?Arturo Morton, PharmD, BCPS ?Clinical Pharmacist ?11/20/2021 9:13 PM ? ?

## 2021-11-20 NOTE — Progress Notes (Signed)
?  [facility] ?Progress Note  ? ?Subjective:  Seen on HD - 1L UFG and tolerating. Looks better today, denies CP/dyspnea. Surgery postponed from yesterday to today. S/p 1U PRBCs overnight. ? ?Objective ?Vitals:  ? 11/20/21 0832 11/20/21 0900 11/20/21 0930 11/20/21 1000  ?BP: (!) 154/77 (!) 143/74 (!) 98/46 117/64  ?Pulse:  77 74 78  ?Resp: '12 16 11 12  '$ ?Temp: 98.8 ?F (37.1 ?C)     ?TempSrc:      ?SpO2:      ?Weight:      ?Height:      ? ?Physical Exam ?General: Injured, but well appearing man, NAD. Room air. ?Heart: RRR; no murmur ?Lungs: CTA anteriorly ?Abdomen: soft, non-tender ?Extremities: No LE edema, 1+ LUE edema. B ankles and L wrist in soft casts ?Dialysis Access: L AVF + thrill ? ?Additional Objective ?Labs: ?Basic Metabolic Panel: ?Recent Labs  ?Lab 11/18/21 ?0421 11/19/21 ?1250 11/20/21 ?0843  ?NA 134* 136 137  ?K 5.6* 5.3* 5.8*  ?CL 93* 95* 94*  ?CO2 '23 22 23  '$ ?GLUCOSE 174* 180* 145*  ?BUN 83* 71* 94*  ?CREATININE 11.33* 11.31* 13.24*  ?CALCIUM 8.6* 9.4 9.4  ?PHOS 6.3*  --  9.1*  ? ?Liver Function Tests: ?Recent Labs  ?Lab 11/17/21 ?1634 11/20/21 ?0843  ?AST 19  --   ?ALT 16  --   ?ALKPHOS 289*  --   ?BILITOT 0.7  --   ?PROT 7.3  --   ?ALBUMIN 3.6 3.1*  ? ?CBC: ?Recent Labs  ?Lab 11/17/21 ?1634 11/17/21 ?1707 11/18/21 ?0421 11/19/21 ?1250 11/20/21 ?2683 11/20/21 ?0843  ?WBC 12.2*  --  10.7* 10.1  --  9.3  ?NEUTROABS  --   --   --  7.3  --  6.8  ?HGB 10.7*   < > 8.4* 7.8* 8.5* 8.2*  ?HCT 32.5*   < > 26.1* 24.3* 25.7* 25.4*  ?MCV 95.3  --  97.4 98.8  --  95.1  ?PLT 172  --  154 138*  --  145*  ? < > = values in this interval not displayed.  ? ?Medications: ? sodium chloride    ? ? (feeding supplement) PROSource Plus  30 mL Oral BID BM  ? sodium chloride   Intravenous Once  ? Chlorhexidine Gluconate Cloth  6 each Topical Q0600  ? cinacalcet  60 mg Oral Q supper  ? darbepoetin (ARANESP) injection - DIALYSIS  60 mcg Intravenous Q Thu-HD  ? doxercalciferol  1 mcg Intravenous Q T,Th,Sa-HD  ?  insulin aspart  0-6 Units Subcutaneous Q4H  ? lanthanum  2,000 mg Oral TID WC  ? ? ?Dialysis Orders: ?TTS at [facility] ?3:45hr, 400/800, EDW 63.5kg, 2K/2Ca, LUE AVF, no heparin ?- No ESA/iron, last Hgb 11 ?- Hectoral 81mg IV q HD ?  ?Assessment/Plan: ?1. Fall from tree/multiple traumatic limb fractures: Ortho following, for surgery today per notes: ORIF to R tibial, L ankle, L wrist. ?2. ESRD: Continue HD per usual TTS schedule - HD now, 1L UFG. ?3. HTN/volume: BP stable, no edema on exam. Not getting home amlod/metop - restart if BP rises. ?4. Anemia of ESRD: Hgb 8.2, 1U PRBCs overnight, will give another since going for large surgery today and also will give Aranesp with HD. ?5. Secondary hyperparathyroidism: Ca ok, Phos high - resume home binders (fosrenol) + sensipar. ?6. Nutrition: Continue supplement for wound healing. ?7. T2DM ?8. HL  ? ?KVeneta Penton PA-C ?11/20/2021, 10:12 AM  ?[facility] ? ? ? ?

## 2021-11-20 NOTE — Anesthesia Postprocedure Evaluation (Signed)
Anesthesia Post Note ? ?Patient: Blake Santana ? ?Procedure(s) Performed: OPEN REDUCTION INTERNAL FIXATION (ORIF) TIBIAL PLATEAU (Right) ?OPEN REDUCTION INTERNAL FIXATION (ORIF) ANKLE PILON FRACTURE (Left: Ankle) ?POSSIBLE OPEN REDUCTION INTERNAL FIXATION (ORIF) DISTAL RADIAL FRACTURE (Left) ? ?  ? ?Patient location during evaluation: PACU ?Anesthesia Type: General ?Level of consciousness: awake and alert ?Pain management: pain level controlled ?Vital Signs Assessment: post-procedure vital signs reviewed and stable ?Respiratory status: spontaneous breathing, nonlabored ventilation, respiratory function stable and patient connected to nasal cannula oxygen ?Cardiovascular status: blood pressure returned to baseline and stable ?Postop Assessment: no apparent nausea or vomiting ?Anesthetic complications: no ? ? ?No notable events documented. ? ?Last Vitals:  ?Vitals:  ? 11/20/21 1830 11/20/21 1845  ?BP: (!) 203/89 (!) 208/89  ?Pulse: 86 81  ?Resp: 10 12  ?Temp:    ?SpO2: 94% 99%  ?  ?Last Pain:  ?Vitals:  ? 11/20/21 1845  ?TempSrc:   ?PainSc: Asleep  ? ? ?  ?  ?  ?  ?  ?  ? ?Tolono S ? ? ? ? ?

## 2021-11-20 NOTE — Transfer of Care (Signed)
Immediate Anesthesia Transfer of Care Note ? ?Patient: Blake Santana ? ?Procedure(s) Performed: OPEN REDUCTION INTERNAL FIXATION (ORIF) TIBIAL PLATEAU (Right) ?OPEN REDUCTION INTERNAL FIXATION (ORIF) ANKLE PILON FRACTURE (Left: Ankle) ?POSSIBLE OPEN REDUCTION INTERNAL FIXATION (ORIF) DISTAL RADIAL FRACTURE (Left) ? ?Patient Location: PACU ? ?Anesthesia Type:General ? ?Level of Consciousness: awake, drowsy and patient cooperative ? ?Airway & Oxygen Therapy: Patient Spontanous Breathing and Patient connected to face mask oxygen ? ?Post-op Assessment: Report given to RN, Post -op Vital signs reviewed and stable and Patient moving all extremities X 4 ? ?Post vital signs: Reviewed and stable ? ?Last Vitals:  ?Vitals Value Taken Time  ?BP 165/113 11/20/21 1813  ?Temp    ?Pulse 86 11/20/21 1818  ?Resp 16 11/20/21 1818  ?SpO2 98 % 11/20/21 1818  ?Vitals shown include unvalidated device data. ? ?Last Pain:  ?Vitals:  ? 11/20/21 1236  ?TempSrc: Oral  ?PainSc: 6   ?   ? ?Patients Stated Pain Goal: 1 (11/20/21 1236) ? ?Complications: No notable events documented. ?

## 2021-11-20 NOTE — Progress Notes (Signed)
PHARMACY NOTE:  ANTIMICROBIAL RENAL DOSAGE ADJUSTMENT ? ?Current antimicrobial regimen includes a mismatch between antimicrobial dosage and estimated renal function.  As per policy approved by the Pharmacy & Therapeutics and Medical Executive Committees, the antimicrobial dosage will be adjusted accordingly. ? ?Current antimicrobial dosage:  cefazolin 2g IV q8 x2 ? ?Indication: post op prophylaxis ? ?Renal Function: ? ?Estimated Creatinine Clearance: 13.1 mL/min (A) (by C-G formula based on SCr of 5.8 mg/dL (H)). ?'[x]'$      On intermittent HD, scheduled: ?'[]'$      On CRRT ?   ?Antimicrobial dosage has been changed to:  No need for post op abx due to ESRD ? ?Additional comments: ? ? ?Onnie Boer, PharmD, BCIDP, AAHIVP, CPP ?Infectious Disease Pharmacist ?11/20/2021 9:12 PM ? ? ? ? ?  ? ?

## 2021-11-20 NOTE — Progress Notes (Signed)
?PROGRESS NOTE ? ? ? ?Blake Santana  ZCH:[id number] DOB: 1971/01/08 DOA: 11/17/2021 ?PCP: [name], NP  ? ? ?Brief Narrative:  ?51 year old gentleman, Spanish only speaking with medical history of ESRD on TTS dialysis, type 2 diabetes with diabetic retinopathy, hypertension and hyperlipidemia presented to the ER with whole body pain after falling from 20 feet ladder while cutting tree branches.  Patient in the ER with uncontrolled pain, multiple skeletal injury as below. ?Patient seen by trauma, admitted to medical service for dialysis needs.  With multitrauma, needing multiple operative interventions. ? ? ?Assessment & Plan: ?  ?Polytrauma ?Right tibial platue fracture ?Mildly displaced fracture of the base of proximal phalanx of right fifth toe ?Acute comminuted left inferior pubic ramus fracture. ?Acute comminuted fracture of the left distal fibula and tibia with extension to the medial malleolus; Acute comminuted fracture left distal radius ?Posterior fracture dislocation of the left elbow ? ?Patient currently neurologically and hemodynamically stable.  Followed by trauma surgery.  Anticipate multiple fracture fixations today. ?Patient is a scheduled for ORIF left forearm, left distal tibia-fibula, right tibial plateau.  Stable to go for surgery.  ?Can use adequate pain medications with Dilaudid and oxycodone.  ? ?Anemia of acute blood loss: Anticipated from multiple long bone fractures and pelvic fracture. ?Baseline hemoglobin 11-7.8 ?Transfused 2 units of PRBC with good clinical response.  ? ?ESRD on hemodialysis: Dialysis 4/18 and 4/18. He has left AC AV fistula.  ?Accessed with removal of back support.  ? ?Essential hypertension: Blood pressure stable.  Off antihypertensives with risk of dropping blood pressures. ? ? ? ?DVT prophylaxis: SCDs Start: 11/17/21 2158 ? ? ?Code Status: Full code ?Family Communication: None ?Disposition Plan: Status is: Inpatient ?Remains inpatient appropriate  because: Polytrauma, severe multiple fracture dislocations. ?  ? ? ?Consultants:  ?Trauma ?Nephrology ? ?Procedures:  ?None ? ?Antimicrobials:  ?None ? ? ?Subjective: ? ?Patient seen in the dialysis unit.  Receiving dialysis.  Tells me that his pain is controlled now after a dose of Dilaudid at 8 AM in the morning.  Receiving second unit of PRBC.  About to complete hemodialysis.  Looking forward to go to surgery. ? ?Objective: ?Vitals:  ? 11/20/21 1100 11/20/21 1130 11/20/21 1204 11/20/21 1236  ?BP: 139/76 (!) 132/103 (!) 150/77 (!) 176/81  ?Pulse:    98  ?Resp: '11 13 16 14  '$ ?Temp: (!) 97.5 ?F (36.4 ?C) (!) 97.5 ?F (36.4 ?C) 97.7 ?F (36.5 ?C) 98.6 ?F (37 ?C)  ?TempSrc: Tympanic   Oral  ?SpO2:    98%  ?Weight:   63 kg   ?Height:      ? ? ?Intake/Output Summary (Last 24 hours) at 11/20/2021 1304 ?Last data filed at 11/20/2021 1204 ?Gross per 24 hour  ?Intake 321.4 ml  ?Output 1000 ml  ?Net -678.6 ml  ? ?Filed Weights  ? 11/20/21 0745 11/20/21 0825 11/20/21 1204  ?Weight: 64 kg 64.3 kg 63 kg  ? ? ?Examination: ? ?General exam: Appears comfortable on dialysis.  Multiple painful points as below. ?Respiratory system: No added sounds. ?Cardiovascular system: S1 & S2 heard, RRR.  ?Gastrointestinal system: Abdomen is nondistended, soft and nontender. No organomegaly or masses felt. Normal bowel sounds heard.  ?Central nervous system: Alert and oriented. No focal neurological deficits.  Unable to move lower extremities with pain. ?Left arm on posterior splint, painful swelling of the left fingers. ?Pelvic compression painful. ?Both lower extremities are painful, right knee is swollen and painful.  ? ? ?Data Reviewed: I  have personally reviewed following labs and imaging studies ? ?CBC: ?Recent Labs  ?Lab 11/17/21 ?1634 11/17/21 ?1707 11/18/21 ?0421 11/19/21 ?1250 11/20/21 ?5859 11/20/21 ?0843  ?WBC 12.2*  --  10.7* 10.1  --  9.3  ?NEUTROABS  --   --   --  7.3  --  6.8  ?HGB 10.7* 11.2* 8.4* 7.8* 8.5* 8.2*  ?HCT 32.5* 33.0*  26.1* 24.3* 25.7* 25.4*  ?MCV 95.3  --  97.4 98.8  --  95.1  ?PLT 172  --  154 138*  --  145*  ? ?Basic Metabolic Panel: ?Recent Labs  ?Lab 11/17/21 ?1634 11/17/21 ?1707 11/18/21 ?0421 11/19/21 ?1250 11/20/21 ?0843  ?NA 136 135 134* 136 137  ?K 4.8 4.7 5.6* 5.3* 5.8*  ?CL 93* 98 93* 95* 94*  ?CO2 24  --  '23 22 23  '$ ?GLUCOSE 229* 229* 174* 180* 145*  ?BUN 75* 75* 83* 71* 94*  ?CREATININE 10.78* 11.90* 11.33* 11.31* 13.24*  ?CALCIUM 9.2  --  8.6* 9.4 9.4  ?PHOS  --   --  6.3*  --  9.1*  ? ?GFR: ?Estimated Creatinine Clearance: 5.7 mL/min (A) (by C-G formula based on SCr of 13.24 mg/dL (H)). ?Liver Function Tests: ?Recent Labs  ?Lab 11/17/21 ?1634 11/20/21 ?0843  ?AST 19  --   ?ALT 16  --   ?ALKPHOS 289*  --   ?BILITOT 0.7  --   ?PROT 7.3  --   ?ALBUMIN 3.6 3.1*  ? ?No results for input(s): LIPASE, AMYLASE in the last 168 hours. ?No results for input(s): AMMONIA in the last 168 hours. ?Coagulation Profile: ?Recent Labs  ?Lab 11/17/21 ?1634  ?INR 1.1  ? ?Cardiac Enzymes: ?No results for input(s): CKTOTAL, CKMB, CKMBINDEX, TROPONINI in the last 168 hours. ?BNP (last 3 results) ?No results for input(s): PROBNP in the last 8760 hours. ?HbA1C: ?Recent Labs  ?  11/18/21 ?0421  ?HGBA1C 7.0*  ? ?CBG: ?Recent Labs  ?Lab 11/19/21 ?2122 11/19/21 ?2358 11/20/21 ?0434 11/20/21 ?0757 11/20/21 ?1232  ?GLUCAP 171* 147* 157* 145* 94  ? ?Lipid Profile: ?No results for input(s): CHOL, HDL, LDLCALC, TRIG, CHOLHDL, LDLDIRECT in the last 72 hours. ?Thyroid Function Tests: ?No results for input(s): TSH, T4TOTAL, FREET4, T3FREE, THYROIDAB in the last 72 hours. ?Anemia Panel: ?No results for input(s): VITAMINB12, FOLATE, FERRITIN, TIBC, IRON, RETICCTPCT in the last 72 hours. ?Sepsis Labs: ?Recent Labs  ?Lab 11/17/21 ?1634 11/18/21 ?0421  ?LATICACIDVEN 1.2 1.1  ? ? ?Recent Results (from the past 240 hour(s))  ?Resp Panel by RT-PCR (Flu A&B, Covid) Nasopharyngeal Swab     Status: None  ? Collection Time: 11/17/21  4:34 PM  ? Specimen:  Nasopharyngeal Swab; Nasopharyngeal(NP) swabs in vial transport medium  ?Result Value Ref Range Status  ? SARS Coronavirus 2 by RT PCR NEGATIVE NEGATIVE Final  ?  Comment: (NOTE) ?SARS-CoV-2 target nucleic acids are NOT DETECTED. ? ?The SARS-CoV-2 RNA is generally detectable in upper respiratory ?specimens during the acute phase of infection. The lowest ?concentration of SARS-CoV-2 viral copies this assay can detect is ?138 copies/mL. A negative result does not preclude SARS-Cov-2 ?infection and should not be used as the sole basis for treatment or ?other patient management decisions. A negative result may occur with  ?improper specimen collection/handling, submission of specimen other ?than nasopharyngeal swab, presence of viral mutation(s) within the ?areas targeted by this assay, and inadequate number of viral ?copies(<138 copies/mL). A negative result must be combined with ?clinical observations, patient history, and epidemiological ?information. The expected result  is Negative. ? ?Fact Sheet for Patients:  ?EntrepreneurPulse.com.au ? ?Fact Sheet for Healthcare Providers:  ?IncredibleEmployment.be ? ?This test is no t yet approved or cleared by the [place] FDA and  ?has been authorized for detection and/or diagnosis of SARS-CoV-2 by ?FDA under an Emergency Use Authorization (EUA). This EUA will remain  ?in effect (meaning this test can be used) for the duration of the ?COVID-19 declaration under Section 564(b)(1) of the Act, 21 ?U.S.C.section 360bbb-3(b)(1), unless the authorization is terminated  ?or revoked sooner.  ? ? ?  ? Influenza A by PCR NEGATIVE NEGATIVE Final  ? Influenza B by PCR NEGATIVE NEGATIVE Final  ?  Comment: (NOTE) ?The Xpert Xpress SARS-CoV-2/FLU/RSV plus assay is intended as an aid ?in the diagnosis of influenza from Nasopharyngeal swab specimens and ?should not be used as a sole basis for treatment. Nasal washings and ?aspirates are unacceptable for  Xpert Xpress SARS-CoV-2/FLU/RSV ?testing. ? ?Fact Sheet for Patients: ?EntrepreneurPulse.com.au ? ?Fact Sheet for Healthcare Providers: ?IncredibleEmployment.be ? ?This test is not yet

## 2021-11-20 NOTE — Progress Notes (Signed)
Pt receives out-pt HD at [facility] on TTS. Pt arrives at 6:00 for 6:15 chair time. Will assist as needed.  ? ?Melven Sartorius ?Renal Navigator ?[phone] ?

## 2021-11-20 NOTE — Interval H&P Note (Signed)
History and Physical Interval Note: ? ?11/20/2021 ?2:35 PM ? ?Blake Santana  has presented today for surgery, with the diagnosis of left elbow, tibial plateau, and left ankle fractures.  The various methods of treatment have been discussed with the patient and family. After consideration of risks, benefits and other options for treatment, the patient has consented to  Procedure(s): ?OPEN REDUCTION INTERNAL FIXATION (ORIF) TIBIAL PLATEAU (Right) ?OPEN REDUCTION INTERNAL FIXATION (ORIF) ANKLE PILON FRACTURE (Left) ?POSSIBLE OPEN REDUCTION INTERNAL FIXATION (ORIF) DISTAL RADIAL FRACTURE (Left) as a surgical intervention.  The patient's history has been reviewed, patient examined, no change in status, stable for surgery.  I have reviewed the patient's chart and labs.  Questions were answered to the patient's satisfaction.   ? ? ?Lennette Bihari P Nakema Fake ? ? ?

## 2021-11-20 NOTE — Anesthesia Procedure Notes (Signed)
Procedure Name: Intubation ?Date/Time: 11/20/2021 3:52 PM ?Performed by: Rande Brunt, CRNA ?Pre-anesthesia Checklist: Patient identified, Emergency Drugs available, Suction available and Patient being monitored ?Patient Re-evaluated:Patient Re-evaluated prior to induction ?Oxygen Delivery Method: Circle System Utilized ?Preoxygenation: Pre-oxygenation with 100% oxygen ?Induction Type: IV induction ?Ventilation: Mask ventilation without difficulty ?Laryngoscope Size: Sabra Heck and 3 ?Grade View: Grade I ?Tube type: Oral ?Tube size: 7.5 mm ?Number of attempts: 1 ?Airway Equipment and Method: Stylet and Oral airway ?Placement Confirmation: ETT inserted through vocal cords under direct vision, positive ETCO2 and breath sounds checked- equal and bilateral ?Secured at: 22 cm ?Tube secured with: Tape ?Dental Injury: Teeth and Oropharynx as per pre-operative assessment  ?Comments: A. Bellami Farrelly CRNA attempted to intubate with MAC 4 with grade 2 view but patient began coughing and more paralytic given IV. Theotis Barrio CRNA x1 with MIller 3 with grade 1 view ? ? ? ? ?

## 2021-11-20 NOTE — Op Note (Signed)
Orthopaedic Surgery Operative Note (CSN: [id number] ) ?Date of Surgery: 11/20/2021  ?Admit Date: 11/17/2021  ? ?Diagnoses: ?Pre-Op Diagnoses: ?Right lateral tibial plateau fracture ?Left pilon fracture ?Left intra-articular distal radius fracture ?Left elbow dislocation  ? ?Post-Op Diagnosis: ?Same ? ?Procedures: ?CPT 42353-IRWE reduction internal fixation of left pilon fracture ?CPT 27535-Open reduction internal fixation of right lateral tibial plateau fracture ?CPT 25608-Open reduction internal fixation of left intra-articular distal radius fracture ?CPT 24605-Closed treatment of left elbow dislocation ? ?Surgeons : ?Primary: [name], MD ? ?Assistant: Patrecia Pace, PA-C ? ?Location: OR 3  ? ?Anesthesia:General  ? ?Antibiotics: Ancef 2g preop with 1 gm vancomycin powder placed in tibial plateau incision  ? ?Tourniquet time: ?Total Tourniquet Time Documented: ?Thigh (Right) - 45 minutes ?Total: Thigh (Right) - 45 minutes ? ?Estimated Blood Loss: 25 mL ? ?Complications:None  ? ?Specimens:None  ? ?Implants: ?Implant Name Type Inv. Item Serial No. Manufacturer Lot No. LRB No. Used Action  ?SCREW CORTEX 3.5 70MM SELF TAP - [id number] Screw SCREW CORTEX 3.5 70MM SELF TAP  [employer]  Right 1 Implanted  ?SCREW CORTEX 3.5X75MM - [id number] Screw SCREW CORTEX 3.5X75MM  [employer]PU[employer]TH[employer]  Right 1 Implanted  ?SCREW CORTEX 3.5 45MM - [id number] Screw SCREW CORTEX 3.5 45MM  DE[employer]TH[employer]  Right 1 Implanted  ?SCREW CORTEX 3.5 38MM - [id number] Screw SCREW CORTEX 3.5 38MM  [employer]  Right 1 Implanted  ?SCREW LOCKING VA 3.5X75MM - [id number] Screw SCREW LOCKING VA 3.5X75MM  [employer]  Right 3 Implanted  ?PLATE PROX 3.5 VA LCP LG 4H RT - [id number] Plate PLATE PROX 3.5 VA LCP LG 4H RT  [employer]  Right 1 Implanted  ?SCREW ACE CAN 4.0 65M - [id number] Screw SCREW ACE CAN 4.0 65M  ZIMMER [employer](ORTH,TRAU,BIO,SG)  Left 1 Implanted  ?SCREW ACE CAN 4.0 22M - [id number] Screw SCREW ACE CAN 4.0 22M   [employer](ORTH,TRAU,BIO,SG)  Left 1 Implanted  ?PLATE NARROW DVR LEFT - [id number] Plate PLATE NARROW DVR LEFT  [employer](ORTH,TRAU,BIO,SG)  Left 1 Implanted  ?SCREW LOCKING 2.7X16 - [id number] Screw SCREW LOCKING 2.7X16  [employer]IMMER [employer](ORTH,TRAU,BIO,SG)  Left 3 Implanted  ?SCREW LOCKING 2.7X22MM - [id number] Screw SCREW LOCKING 2.7X22MM  ZIMMER [employer]ON(ORTH,TRAU,BIO,SG)  Left 4 Implanted  ?SCREW LOCKING 2.7X20MM - [id number] Screw SCREW LOCKING 2.7X20MM  ZIMMER [employer](ORTH,TRAU,BIO,SG)  Left 1 Implanted  ?SCREW LOCKING 2.7X14 - [id number] Screw SCREW LOCKING 2.7X14  ZIMMER [employer]ON(ORTH,TRAU,BIO,SG)  Left 1 Implanted  ?  ? ?Indications for Surgery: ?51 year old male with multiple medical comorbidities including diabetes and end-stage renal disease on dialysis that fell 20 feet from a ladder sustaining multiple orthopedic injuries including a right lateral tibial plateau fracture, a left intra-articular pilon fracture, a left intra-articular distal radius fracture, and a left closed elbow dislocation.  Due to the unstable nature of his injuries I recommend proceeding with open reduction internal fixation of his right tibial plateau.  I also discussed the possibility of open reduction internal fixation of his left pilon although he has at creased risk with his medical comorbidities for wound healing problems.  I also discussed open reduction internal fixation of his left distal radius with evaluation of his left elbow in the operating room.  Risk and benefits were discussed.  Risks included but not limited to bleeding, infection, malunion, nonunion, hardware failure, hardware irritation, nerve and blood vessel injury, DVT, even the possibility anesthetic complications.  He agreed to proceed with surgery and consent was obtained. ? ?Operative Findings: ?1.  Open reduction internal fixation  of right lateral tibial plateau fracture using Synthes VA 3.5 mm proximal tibial locking plate ?2.  Open reduction and percutaneous  fixation of intra-articular distal tibia and fibula fracture on the left using Zimmer [employer] 4.0 mm cannulated screws ?3.  Open reduction internal fixation of intra-articular distal radius fracture using Zimmer [employer] DVR volar distal radius plate ?4.  Evaluation of the left elbow under anesthesia showing a concentric reduction without any instability of the elbow. ? ?Procedure: ?The patient was identified in the preoperative holding area. Consent was confirmed with the patient and their family and all questions were answered. The operative extremity was marked after confirmation with the patient. he was then brought back to the operating room by our anesthesia colleagues.  He was placed under general anesthetic and carefully transferred over to a radiolucent flat top table.  His bilateral lower extremities were then prepped and draped in usual sterile fashion.  The left upper extremity was prepped and draped in usual sterile fashion.  A timeout was performed to verify the patient, the procedure, and the extremities.  Preoperative antibiotics were dosed. ? ?I for started out with the right lower extremity.  The hip and knee were flexed over a triangle.  Fluoroscopic imaging showed the unstable nature of his injury.  A lateral parapatellar approach was made and carried down through skin and subcutaneous tissue.  I released the IT band off of the lateral condyle to expose the bone.  I then performed a submeniscal arthrotomy to visualize the joint.  There is no meniscus tear visualized.  I was able to visualize the split and traced this back as posterior as I could go and it was not significant amount of step-off but there was some step-off that could feel with the Valora Corporal very posterior in the joint.  I entered the split with a Cobb elevator and tried to elevate the posterior joint however the bone quality was significantly poor felt that it would cause more damage by trying to access this.  I felt with the anterior  joint being well reduced and the meniscus intact that this few millimeters of step-off will be inconsequential to his outcome. ? ?A clamp was used to reduce the articular surface and then I placed 3.5 mm independent screw to hold the condyle in place while I remove the clamp.  I then placed a Synthes VA proximal tibial locking plate.  I placed a nonlocking screw proximally to bring the plate flush to bone.  I then placed two 3.5 millimeter screws into the tibial shaft to bring the distal portion of the plate flush to bone.  I then placed 3 locking screws to hold the articular surface laterally.  Final fluoroscopic imaging was obtained.  The incision was copiously irrigated.  The free needles were used to tag the capsule back down to the plate.  A gram of vancomycin powder was placed into the incision.  A layered closure of 0 Vicryl, 2-0 Vicryl and 3-0 nylon was used to close the skin.  I then turned my attention to the left lower extremity. ? ?The patient had some blistering where the splint had been applied to his left lower extremity.  He also has diabetes with neuropathy as well as end-stage renal disease with dialysis and peripheral vascular disease.  These comorbidities would increase a full open reduction and internal fixation with plate and screws to be at high risk for wound healing problems and potential infection.  However due to his high level of  activity and his significant joint displacement I felt that percutaneous fixation with a mini open reduction would be possible.  I made a small 1 cm incision medial to the anterior tibialis tendon and carried that down to bone.  I then used a freer elevator to go underneath the neurovascular bundle and the tendons to manipulate the impacted anterior articular surface.  I was able to manipulate the fragment back into reduction and then I percutaneously placed 1.6 mm K wires lateral and medial to the neurovascular bundle.  I confirmed reduction with fluoroscopy  both AP and lateral. ? ?Once I had percutaneous fixation I then used Zimmer [employer]omet 4.0 mm cannulated screws and measured the K wires and cut down on these and then placed 4.0 mm cannulated screws to hold the redu

## 2021-11-21 ENCOUNTER — Encounter (HOSPITAL_COMMUNITY): Payer: Self-pay | Admitting: Student

## 2021-11-21 DIAGNOSIS — S53125D Posterior dislocation of left ulnohumeral joint, subsequent encounter: Secondary | ICD-10-CM

## 2021-11-21 DIAGNOSIS — E118 Type 2 diabetes mellitus with unspecified complications: Secondary | ICD-10-CM

## 2021-11-21 DIAGNOSIS — N185 Chronic kidney disease, stage 5: Secondary | ICD-10-CM

## 2021-11-21 DIAGNOSIS — D631 Anemia in chronic kidney disease: Secondary | ICD-10-CM

## 2021-11-21 LAB — TYPE AND SCREEN
ABO/RH(D): O POS
Antibody Screen: NEGATIVE
Unit division: 0
Unit division: 0

## 2021-11-21 LAB — BPAM RBC
Blood Product Expiration Date: 202305242359
Blood Product Expiration Date: 202305252359
ISSUE DATE / TIME: 202304191613
ISSUE DATE / TIME: 202304201037
Unit Type and Rh: 5100
Unit Type and Rh: 5100

## 2021-11-21 LAB — GLUCOSE, CAPILLARY
Glucose-Capillary: 185 mg/dL — ABNORMAL HIGH (ref 70–99)
Glucose-Capillary: 195 mg/dL — ABNORMAL HIGH (ref 70–99)
Glucose-Capillary: 242 mg/dL — ABNORMAL HIGH (ref 70–99)
Glucose-Capillary: 244 mg/dL — ABNORMAL HIGH (ref 70–99)
Glucose-Capillary: 253 mg/dL — ABNORMAL HIGH (ref 70–99)
Glucose-Capillary: 324 mg/dL — ABNORMAL HIGH (ref 70–99)

## 2021-11-21 MED ORDER — AMLODIPINE BESYLATE 10 MG PO TABS
10.0000 mg | ORAL_TABLET | Freq: Every day | ORAL | Status: DC
  Administered 2021-11-21 – 2021-11-27 (×7): 10 mg via ORAL
  Filled 2021-11-21 (×7): qty 1

## 2021-11-21 MED ORDER — CHLORHEXIDINE GLUCONATE CLOTH 2 % EX PADS
6.0000 | MEDICATED_PAD | Freq: Every day | CUTANEOUS | Status: DC
  Administered 2021-11-22 – 2021-11-24 (×3): 6 via TOPICAL

## 2021-11-21 MED ORDER — HYDRALAZINE HCL 25 MG PO TABS: 25.0000 mg | ORAL_TABLET | Freq: Three times a day (TID) | ORAL | Status: DC | PRN

## 2021-11-21 MED ORDER — INSULIN ASPART 100 UNIT/ML IJ SOLN
2.0000 [IU] | Freq: Three times a day (TID) | INTRAMUSCULAR | Status: DC
Start: 2021-11-22 — End: 2021-11-25
  Administered 2021-11-22 – 2021-11-24 (×6): 2 [IU] via SUBCUTANEOUS

## 2021-11-21 MED ORDER — VITAMIN D 25 MCG (1000 UNIT) PO TABS
2000.0000 [IU] | ORAL_TABLET | Freq: Every day | ORAL | Status: DC
  Administered 2021-11-21 – 2021-11-27 (×7): 2000 [IU] via ORAL
  Filled 2021-11-21 (×7): qty 2

## 2021-11-21 NOTE — Progress Notes (Signed)
?[facility] ?Progress Note  ? ?Subjective:   Patient seen and examined at bedside with remote-interpreter.  Reports pain especially in his extremities but other wise feeling ok. Denies CP, SOB, n/v/d and fatigue.  Dialysis went well yesterday.  ? ?Objective ?Vitals:  ? 11/20/21 2121 11/21/21 0000 11/21/21 0425 11/21/21 0904  ?BP: (!) 192/83 (!) 153/56 (!) 119/97 (!) 147/69  ?Pulse: 91 69 66 95  ?Resp: '15  19 20  '$ ?Temp: 99.5 ?F (37.5 ?C)     ?TempSrc: Oral     ?SpO2: 100% 100% 100% (!) 80%  ?Weight:      ?Height:      ? ?Physical Exam ?General:injured, well appearing male in NAD  ?Heart:RRR, no mrg ?Lungs:CTAB, nml WOB ?Abdomen:soft, NTND, +BS ?Extremities:b/l LE and L arm dressed, no edema ?Dialysis Access: LU AVF +b/t - under LUE dressing  ? ?Filed Weights  ? 11/20/21 0745 11/20/21 0825 11/20/21 1204  ?Weight: 64 kg 64.3 kg 63 kg  ? ? ?Intake/Output Summary (Last 24 hours) at 11/21/2021 1108 ?Last data filed at 11/21/2021 0400 ?Gross per 24 hour  ?Intake 1227 ml  ?Output 1000 ml  ?Net 227 ml  ? ? ?Additional Objective ?Labs: ?Basic Metabolic Panel: ?Recent Labs  ?Lab 11/18/21 ?0421 11/19/21 ?1250 11/20/21 ?9390 11/20/21 ?1418  ?NA 134* 136 137 134*  ?K 5.6* 5.3* 5.8* 3.9  ?CL 93* 95* 94* 96*  ?CO2 '23 22 23  '$ --   ?GLUCOSE 174* 180* 145* 110*  ?BUN 83* 71* 94* 26*  ?CREATININE 11.33* 11.31* 13.24* 5.80*  ?CALCIUM 8.6* 9.4 9.4  --   ?PHOS 6.3*  --  9.1*  --   ? ?Liver Function Tests: ?Recent Labs  ?Lab 11/17/21 ?1634 11/20/21 ?0843  ?AST 19  --   ?ALT 16  --   ?ALKPHOS 289*  --   ?BILITOT 0.7  --   ?PROT 7.3  --   ?ALBUMIN 3.6 3.1*  ? ?CBC: ?Recent Labs  ?Lab 11/17/21 ?1634 11/17/21 ?1707 11/18/21 ?0421 11/19/21 ?1250 11/20/21 ?3009 11/20/21 ?2330 11/20/21 ?1418  ?WBC 12.2*  --  10.7* 10.1  --  9.3  --   ?NEUTROABS  --   --   --  7.3  --  6.8  --   ?HGB 10.7*   < > 8.4* 7.8* 8.5* 8.2* 11.2*  ?HCT 32.5*   < > 26.1* 24.3* 25.7* 25.4* 33.0*  ?MCV 95.3  --  97.4 98.8  --  95.1  --   ?PLT 172  --  154 138*   --  145*  --   ? < > = values in this interval not displayed.  ? ?CBG: ?Recent Labs  ?Lab 11/20/21 ?1817 11/20/21 ?2036 11/21/21 ?0007 11/21/21 ?0408 11/21/21 ?0830  ?GLUCAP 124* 164* 244* 253* 185*  ? ?Studies/Results: ?DG Wrist 2 Views Left ? ?Result Date: 11/20/2021 ?CLINICAL DATA:  Left wrist, right knee and left ankle post ORIF films. EXAM: LEFT WRIST - 2 VIEW; LEFT ANKLE COMPLETE - 3+ VIEW; PORTABLE RIGHT KNEE - 1-2 VIEW COMPARISON:  Preoperative x-rays of the left wrist, right knee and left ankle dated 11/17/2021. FINDINGS: Left wrist: AP, lateral and oblique views in a cast are provided. There has been ventral ORIF plate fixation based against the ventral distal radial shaft and metaphysis, with multiple short securing screws. Post ORIF fracture alignment approximates anatomic. No new fracture has become apparent. A slightly laterally displaced ulnar styloid fracture is again shown and unchanged. There are extensive vascular calcifications in the forearm,  wrist and hand, including with calcifications extending into the digital arteries. Generalized soft tissue swelling appears similar. Right knee: Routine four views are provided. There is an air-fluid level in the suprapatellar bursal space. There is scattered linear subcutaneous soft tissue gas laterally and a lateral sideplate ORIF with threaded securing screws against the lateral aspect of the proximal tibial metaphysis and diametaphysis. Post ORIF fracture alignment approximates anatomic. Joint spaces are maintained. No new fracture has become apparent. There are extensive vascular calcifications. Left ankle: AP, lateral and oblique views are provided in plaster casting. Two threaded screws enter obliquely from anteriorly into the distal tibial metaphysis, with near anatomic post fracture fixation alignment but with again noted mild widening of the lateral mortise and mild narrowing of the medial mortise. Lateral malleolar fracture with mild lateral  displacement and angulation continues to be seen and did not undergo ORIF. No new fracture has become apparent. Generalized soft tissue swelling appears similar. Extensive vascular calcifications. IMPRESSION: Postoperative changes of left wrist, right knee and left ankle as above. No acute hardware complication is seen. Lateral malleolar fracture did not undergo ORIF. Extensive vascular calcifications. Electronically Signed   By: Telford Nab M.D.   On: 11/20/2021 20:54  ? ?DG Wrist Complete Left ? ?Result Date: 11/20/2021 ?CLINICAL DATA:  ORIF distal radial fracture EXAM: LEFT WRIST - COMPLETE 3+ VIEW COMPARISON:  11/17/2021 FINDINGS: Three fluoroscopic images are obtained during the performance of the procedure and are provided for interpretation only. Images demonstrate plate and screw fixation of a distal left radial fracture. Fluoroscopy time: 2 minutes 42 seconds 1.98 mGy Please note that fluoroscopy time and dose are for the combined radius, tibial, and ankle fluoroscopy. IMPRESSION: Expected intraoperative appearance distal left radial fracture fixation. Electronically Signed   By: Merilyn Baba M.D.   On: 11/20/2021 18:04  ? ?DG Ankle Complete Left ? ?Result Date: 11/20/2021 ?CLINICAL DATA:  Left wrist, right knee and left ankle post ORIF films. EXAM: LEFT WRIST - 2 VIEW; LEFT ANKLE COMPLETE - 3+ VIEW; PORTABLE RIGHT KNEE - 1-2 VIEW COMPARISON:  Preoperative x-rays of the left wrist, right knee and left ankle dated 11/17/2021. FINDINGS: Left wrist: AP, lateral and oblique views in a cast are provided. There has been ventral ORIF plate fixation based against the ventral distal radial shaft and metaphysis, with multiple short securing screws. Post ORIF fracture alignment approximates anatomic. No new fracture has become apparent. A slightly laterally displaced ulnar styloid fracture is again shown and unchanged. There are extensive vascular calcifications in the forearm, wrist and hand, including with  calcifications extending into the digital arteries. Generalized soft tissue swelling appears similar. Right knee: Routine four views are provided. There is an air-fluid level in the suprapatellar bursal space. There is scattered linear subcutaneous soft tissue gas laterally and a lateral sideplate ORIF with threaded securing screws against the lateral aspect of the proximal tibial metaphysis and diametaphysis. Post ORIF fracture alignment approximates anatomic. Joint spaces are maintained. No new fracture has become apparent. There are extensive vascular calcifications. Left ankle: AP, lateral and oblique views are provided in plaster casting. Two threaded screws enter obliquely from anteriorly into the distal tibial metaphysis, with near anatomic post fracture fixation alignment but with again noted mild widening of the lateral mortise and mild narrowing of the medial mortise. Lateral malleolar fracture with mild lateral displacement and angulation continues to be seen and did not undergo ORIF. No new fracture has become apparent. Generalized soft tissue swelling appears similar. Extensive vascular  calcifications. IMPRESSION: Postoperative changes of left wrist, right knee and left ankle as above. No acute hardware complication is seen. Lateral malleolar fracture did not undergo ORIF. Extensive vascular calcifications. Electronically Signed   By: Telford Nab M.D.   On: 11/20/2021 20:54  ? ?DG Ankle Complete Left ? ?Result Date: 11/20/2021 ?CLINICAL DATA:  Fluoroscopy for open reduction internal fixation of left ankle fracture EXAM: LEFT ANKLE COMPLETE - 3+ VIEW COMPARISON:  CT left ankle 11/17/2021 FINDINGS: Images were performed intraoperatively without the presence of a radiologist. Patient is undergoing screw fixation of the distal tibial fracture. Distal fibular metaphyseal fracture is again noted. Total fluoroscopy images: 8 Please see intraoperative findings for further detail. IMPRESSION: Fluoroscopy for  ORIF of the distal tibia. Electronically Signed   By: Yvonne Kendall M.D.   On: 11/20/2021 18:06  ? ?DG Knee Complete 4 Views Right ? ?Result Date: 11/20/2021 ?CLINICAL DATA:  ORIF right tibial plateau fracture EXAM

## 2021-11-21 NOTE — Progress Notes (Signed)
? ? ? [facility] ?                                                                            ? ? ?Josyah Achor, is a 51 y.o. male, DOB - 06/02/1971, UDJ:[id number] ?Admit date - 11/17/2021    ?Outpatient Primary MD for the patient is [name], NP ? ?LOS - 4  days ? ? ? ?Brief summary  ? ?Kashden Deboy Acevedo-Ocejo is a Spanish-speaking 51 y.o. male with medical history significant for ESRD on TTS HD, T2DM with diabetic retinopathy, anemia of ESRD, HTN, HLD, PAD who is admitted with multiple skeletal fractures after fall from 20 foot height.  Orthopedics consulting for further management. ? ? ?Assessment & Plan  ? ? ?Assessment and Plan: ?* Closed fracture of inferior pubic ramus, left,  ?Acute comminuted fracture of the left distal fibula and tibia with extension to the medial malleolus; possible left calcaneal fracture ?Acute comminuted fracture left distal radius ?Mildly displaced fracture of the base of proximal phalanx of right fifth toe ?Possible proximal right tibial fracture ?Posterior dislocation of the left elbow, s/p reduction in the ED ?Traumatic fall from 20 foot height ?Orthopedics consulted, s/p ORIF of the right tibial plateau,,ORIF of the left ankle , ORIF of the left distal radial fracture . ?He should be non weight bearing on the left upper extremity left lower extremity and right lower extremity for at least 6 weeks.  ?Pain control with tylenol, oxycodone  and dilaudid along with Robaxin.  ?Therapy eval recommending CIR.  ?Recommend outpatient follow up with orthopedics in 2 weeks.  ? ?ESRD on hemodialysis TTS ([place]) ?Nephrology on board for HD.  ? ?Type 2 diabetes mellitus with complication, without long-term current use of insulin ([place]) ?CBG (last 3)  ?Recent Labs  ?  11/21/21 ?0830 11/21/21 ?1136 11/21/21 ?1604  ?GLUCAP 185* 324* 242*  ? ?Hemoglobin A1c is 7. Continue with SSI.  ?Will start him on novolog [facility].  ? ?Hypertension associated with diabetes ([place]) ?BP elevated  in setting of uncontrolled pain from multiple skeletal fractures.  Continue amlodipine, Lopressor, analgesics as needed. ? ?Anemia of chronic kidney failure ?Anemia of blood loss from the surgery.  ?S/p 2 units of prbc transfusion.  ?Repeat hemoglobin is around 11.2, recheck CBC in am.  ? ? ?Hyperlipidemia associated with type 2 diabetes mellitus ([place]) ?Hold statin for now. ? ? ?Estimated body mass index is 22.42 kg/m? as calculated from the following: ?  Height as of this encounter: '5\' 6"'$  (1.676 m). ?  Weight as of this encounter: 63 kg. ? ?Code Status: full code.  ?DVT Prophylaxis:  heparin injection 5,000 Units Start: 11/20/21 2215 ?SCDs Start: 11/20/21 2108 ?SCDs Start: 11/17/21 2158 ? ? ?Level of Care: Level of care: Med-Surg ?Family Communication: none at bedside.  ? ?Disposition Plan:     Remains inpatient appropriate:  CIR  ? ?Procedures:  ?ORIF TO R tibial.  ? ?Consultants:   ?Orthopedics ?Nephrology.  ? ?Antimicrobials:  ? ?Anti-infectives (From admission, onward)  ? ? Start     Dose/Rate Route Frequency Ordered Stop  ? 11/20/21 2200  ceFAZolin (ANCEF) IVPB 2g/100 mL premix  Status:  Discontinued       ?  2 g ?200 mL/hr over 30 Minutes Intravenous Every 8 hours 11/20/21 2107 11/20/21 2110  ? 11/20/21 1638  vancomycin (VANCOCIN) powder  Status:  Discontinued       ?   As needed 11/20/21 1639 11/20/21 1808  ? ?  ? ? ? ?Medications ? ?Scheduled Meds: ? (feeding supplement) PROSource Plus  30 mL Oral BID BM  ? sodium chloride   Intravenous Once  ? sodium chloride   Intravenous Once  ? acetaminophen  1,000 mg Oral Q6H  ? Chlorhexidine Gluconate Cloth  6 each Topical Q0600  ? [START ON 11/22/2021] Chlorhexidine Gluconate Cloth  6 each Topical Q0600  ? cholecalciferol  2,000 Units Oral Daily  ? cinacalcet  60 mg Oral Q supper  ? darbepoetin (ARANESP) injection - DIALYSIS  60 mcg Intravenous Q Thu-HD  ? docusate sodium  100 mg Oral BID  ? doxercalciferol  1 mcg Intravenous Q T,Th,Sa-HD  ? heparin injection  (subcutaneous)  5,000 Units Subcutaneous Q8H  ? insulin aspart  0-6 Units Subcutaneous Q4H  ? lanthanum  2,000 mg Oral TID WC  ? ?Continuous Infusions: ? methocarbamol (ROBAXIN) IV    ? sodium chloride    ? ?PRN Meds:.diphenhydrAMINE, hydrALAZINE, HYDROmorphone (DILAUDID) injection, methocarbamol **OR** methocarbamol (ROBAXIN) IV, oxyCODONE, oxyCODONE, polyethylene glycol, senna-docusate, sodium chloride ? ? ? ?Subjective:  ? ?Morey Acevedo-Ocejo was seen and examined today.  Pt reports pain not well controlled.  ? ?Objective:  ? ?Vitals:  ? 11/21/21 0000 11/21/21 0425 11/21/21 0904 11/21/21 1407  ?BP: (!) 153/56 (!) 119/97 (!) 147/69 (!) 168/75  ?Pulse: 69 66 95 70  ?Resp:  '19 20 16  '$ ?Temp:      ?TempSrc:      ?SpO2: 100% 100% (!) 80% 100%  ?Weight:      ?Height:      ? ? ?Intake/Output Summary (Last 24 hours) at 11/21/2021 1437 ?Last data filed at 11/21/2021 1400 ?Gross per 24 hour  ?Intake 1512 ml  ?Output --  ?Net 1512 ml  ? ?Filed Weights  ? 11/20/21 0745 11/20/21 0825 11/20/21 1204  ?Weight: 64 kg 64.3 kg 63 kg  ? ? ? ?Exam ?General exam: Appears calm and comfortable  ?Respiratory system: Clear to auscultation. Respiratory effort normal. ?Cardiovascular system: S1 & S2 heard, RRR. No JVD, No pedal edema. ?Gastrointestinal system: Abdomen is nondistended, soft and nontender. Normal bowel sounds heard. ?Central nervous system: Alert and oriented. No focal neurological deficits. ?Extremities: right lower extremity in bandages. Left upper extremity bandaged.  ?Skin: No rashes, lesions or ulcers ?Psychiatry: Mood & affect appropriate.  ? ? ?Data Reviewed:  I have personally reviewed following labs and imaging studies ? ? ?CBC ?Lab Results  ?Component Value Date  ? WBC 9.3 11/20/2021  ? RBC 2.67 (L) 11/20/2021  ? HGB 11.2 (L) 11/20/2021  ? HCT 33.0 (L) 11/20/2021  ? MCV 95.1 11/20/2021  ? MCH 30.7 11/20/2021  ? PLT 145 (L) 11/20/2021  ? MCHC 32.3 11/20/2021  ? RDW 15.3 11/20/2021  ? LYMPHSABS 1.4 11/20/2021  ? MONOABS  1.0 11/20/2021  ? EOSABS 0.1 11/20/2021  ? BASOSABS 0.0 11/20/2021  ? ? ? ?Last metabolic panel ?Lab Results  ?Component Value Date  ? NA 134 (L) 11/20/2021  ? K 3.9 11/20/2021  ? CL 96 (L) 11/20/2021  ? CO2 23 11/20/2021  ? BUN 26 (H) 11/20/2021  ? CREATININE 5.80 (H) 11/20/2021  ? GLUCOSE 110 (H) 11/20/2021  ? GFRNONAA 4 (L) 11/20/2021  ? GFRAA 11 (L) 04/02/2020  ?  CALCIUM 9.4 11/20/2021  ? PHOS 9.1 (H) 11/20/2021  ? PROT 7.3 11/17/2021  ? ALBUMIN 3.1 (L) 11/20/2021  ? LABGLOB 3.2 01/15/2021  ? AGRATIO 1.4 01/15/2021  ? BILITOT 0.7 11/17/2021  ? ALKPHOS 289 (H) 11/17/2021  ? AST 19 11/17/2021  ? ALT 16 11/17/2021  ? ANIONGAP 20 (H) 11/20/2021  ? ? ?CBG (last 3)  ?Recent Labs  ?  11/21/21 ?0408 11/21/21 ?0830 11/21/21 ?1136  ?GLUCAP 253* 185* 324*  ?  ? ? ?Coagulation Profile: ?Recent Labs  ?Lab 11/17/21 ?1634  ?INR 1.1  ? ? ? ?Radiology Studies: ?DG Wrist 2 Views Left ? ?Result Date: 11/20/2021 ?CLINICAL DATA:  Left wrist, right knee and left ankle post ORIF films. EXAM: LEFT WRIST - 2 VIEW; LEFT ANKLE COMPLETE - 3+ VIEW; PORTABLE RIGHT KNEE - 1-2 VIEW COMPARISON:  Preoperative x-rays of the left wrist, right knee and left ankle dated 11/17/2021. FINDINGS: Left wrist: AP, lateral and oblique views in a cast are provided. There has been ventral ORIF plate fixation based against the ventral distal radial shaft and metaphysis, with multiple short securing screws. Post ORIF fracture alignment approximates anatomic. No new fracture has become apparent. A slightly laterally displaced ulnar styloid fracture is again shown and unchanged. There are extensive vascular calcifications in the forearm, wrist and hand, including with calcifications extending into the digital arteries. Generalized soft tissue swelling appears similar. Right knee: Routine four views are provided. There is an air-fluid level in the suprapatellar bursal space. There is scattered linear subcutaneous soft tissue gas laterally and a lateral sideplate  ORIF with threaded securing screws against the lateral aspect of the proximal tibial metaphysis and diametaphysis. Post ORIF fracture alignment approximates anatomic. Joint spaces are maintained. No new fracture

## 2021-11-21 NOTE — Evaluation (Signed)
Physical Therapy Evaluation ?Patient Details ?Name: Blake Santana ?MRN: [id number] ?DOB: 1970-11-29 ?Today's Date: 11/21/2021 ? ?History of Present Illness ? Pt is a 51 y.o. male admitted 11/17/21 as level 2 trauma after ~20 ft fall from ladder while cutting tree branches. Pt sustained L elbow dislocation s/p reduction, L pilon fx, R tibial plateau fx, R 5th toe fx, pubic ramus fx. S/p L pilon ORIF, R tibial ORIF, L distal radius ORIF, closed tx L elbow dislocation. PMH includes HTN, CHF, ESRD (HD TTS), DM, cataracts. ?  ?Clinical Impression ? Pt presents with an overall decrease in functional mobility secondary to above. PTA, pt independent with intermittent use of SPC, does not work, lives with supportive wife. Initiated educ re: precautions, positioning, therex, and importance of mobility. Today, pt required maxA+2 to sit EOB; able to maintain unsupported sitting at supervision-level. Pt limited by significant pain and NWB precautions in 3/4 extremities, along with pubic rami fx. Pt motivated to participate and tolerating pain well. Wife present and supportive; family available for 24/7 assist. Pt is an excellent candidate for intensive CIR-level therapies to maximize functional mobility and independence prior to d/c home. Will follow acutely to address established goals.  ?  ?SpO2 95-100% on RA, HR 70s-80s  ? ?Recommendations for follow up therapy are one component of a multi-disciplinary discharge planning process, led by the attending physician.  Recommendations may be updated based on patient status, additional functional criteria and insurance authorization. ? ?Follow Up Recommendations Acute inpatient rehab (3hours/day) ? ?  ?Assistance Recommended at Discharge Intermittent Supervision/Assistance  ?Patient can return home with the following ? A lot of help with walking and/or transfers;A lot of help with bathing/dressing/bathroom;Assistance with cooking/housework;Assist for transportation;Help with  stairs or ramp for entrance ? ?  ?Equipment Recommendations If home - BSC/3in1;Wheelchair (measurements PT);Wheelchair cushion (measurements PT);Hoyer lift  ?Recommendations for Other Services ?    ?  ?Functional Status Assessment Patient has had a recent decline in their functional status and demonstrates the ability to make significant improvements in function in a reasonable and predictable amount of time.  ? ?  ?Precautions / Restrictions Precautions ?Precautions: Fall ?Required Braces or Orthoses: Splint/Cast ?Splint/Cast: L ankle ?Restrictions ?Weight Bearing Restrictions: Yes ?LUE Weight Bearing: Non weight bearing ?RLE Weight Bearing: Non weight bearing ?LLE Weight Bearing: Non weight bearing ?Other Position/Activity Restrictions: Ortho PA verbal clarification - unrestricted AROM L shoulder, L knee/hip, R ankle/knee/hip; pt NOT to WB through L elbow  ? ?  ? ?Mobility ? Bed Mobility ?Overal bed mobility: Needs Assistance ?Bed Mobility: Supine to Sit, Sit to Supine ?  ?  ?Supine to sit: Max assist, +2 for physical assistance, HOB elevated ?Sit to supine: Max assist, +2 for physical assistance ?  ?General bed mobility comments: pt able to come to sitting on R-side bed with use of RUE on bed rail and maxA+2 for BLE management, trunk elevation and scooting hips to EOB; return to long sitting in bed with maxA+2, pt able to maintain unsupported long sitting, maxA+2 to scoot hips up in bed ?  ? ?Transfers ?  ?  ?  ?  ?  ?  ?  ?  ?  ?General transfer comment: will likely require maximove lift or maxA+2 for anterior-posterior transfer due to WB restrictions ?  ? ?Ambulation/Gait ?  ?  ?  ?  ?  ?  ?  ?  ? ?Stairs ?  ?  ?  ?  ?  ? ?Wheelchair Mobility ?  ? ?  Modified Rankin (Stroke Patients Only) ?  ? ?  ? ?Balance Overall balance assessment: Needs assistance ?Sitting-balance support: No upper extremity supported, Feet unsupported ?Sitting balance-Leahy Scale: Fair ?  ?  ?  ?  ?  ?  ?  ?  ?  ?  ?  ?  ?  ?  ?  ?  ?    ? ? ? ?Pertinent Vitals/Pain Pain Assessment ?Pain Assessment: 0-10 ?Pain Score: 9  ?Pain Location: from pelvis to toes ?Pain Descriptors / Indicators: Discomfort, Grimacing, Guarding, Sore ?Pain Intervention(s): Monitored during session, Premedicated before session, Repositioned  ? ? ?Home Living Family/patient expects to be discharged to:: Private residence ?Living Arrangements: Spouse/significant other ?Available Help at Discharge: Family;Friend(s);Available 24 hours/day ?Type of Home: House ?Home Access: Stairs to enter ?Entrance Stairs-Rails: None ?Entrance Stairs-Number of Steps: 4 ?  ?Home Layout: One level ?Home Equipment: Kasandra Knudsen - single point ?Additional Comments: encouraged pt and wife to start asking around if friends/family can build ramp  ?  ?Prior Function Prior Level of Function : Independent/Modified Independent;Driving ?  ?  ?  ?  ?  ?  ?Mobility Comments: Independent with intermittent use of SPC due to RLE gout. Drives self to HD appts (TTS). Does not work; enjoys yard work, Building services engineer, being on phone ?ADLs Comments: independent with ADL/iADLs ?  ? ? ?Hand Dominance  ? Dominant Hand: Right ? ?  ?Extremity/Trunk Assessment  ? Upper Extremity Assessment ?Upper Extremity Assessment: RUE deficits/detail;LUE deficits/detail ?RUE Deficits / Details: s/p elbow dislocation reduction; functionally >4/5 strength throughout, denies pain with ROM ?LUE Deficits / Details: s/p L distal radius ORIF in cast; able to partially flex/ext fingers, noted swelling; L shoulder with good AROM although painful, gross function at least 3/5 ?  ? ?Lower Extremity Assessment ?Lower Extremity Assessment: RLE deficits/detail;LLE deficits/detail ?RLE Deficits / Details: s/p R pilon ORIF with expected post-op pain and weakness; minimal ankle/knee activation noted, limited by significant pain, able to minimally wiggle toes; suspect seated hip AROM mainly limited by pain from pubic rami fx ?RLE: Unable to fully assess due to  pain;Unable to fully assess due to immobilization ?LLE Deficits / Details: s/p L tibial ORIF with lower leg/ankle cast; able to minimally wiggle toes; L hip and knee grossly >3/5 throughout ?LLE: Unable to fully assess due to pain;Unable to fully assess due to immobilization ?  ? ?   ?Communication  ? Communication: Prefers language other than [place];Interpreter utilized  ?Cognition Arousal/Alertness: Awake/alert ?Behavior During Therapy: [facility] for tasks assessed/performed ?Overall Cognitive Status: Within Functional Limits for tasks assessed ?  ?  ?  ?  ?  ?  ?  ?  ?  ?  ?  ?  ?  ?  ?  ?  ?  ?  ?  ? ?  ?General Comments General comments (skin integrity, edema, etc.): Session performed via in-person Spanish interpreter Spero Geralds). Pt's wife present and supportive. Initiated educ re: precautions, positioning, DVT prevention, edema control, therex/ROM, DME needs, d/c planning. Ortho PA [facility]) present at beginning of session, able to clarify activity restrictions. SpO2 95-100% on RA, HR 70s-80s ? ?  ?Exercises Other Exercises ?Other Exercises: R calf stretch w/ strap, R heel slide w/ strap (partial range, very painful), difficulty performing R ankle pumps or R quad set; L hip/knee flex (knee to chest) and partial SLR ?Other Exercises: Incentive spirometer provided, educ on use  ? ?Assessment/Plan  ?  ?PT Assessment Patient needs continued PT services  ?PT Problem List  Decreased strength;Decreased range of motion;Decreased activity tolerance;Decreased balance;Decreased mobility;Decreased knowledge of use of DME;Decreased knowledge of precautions;Pain ? ?   ?  ?PT Treatment Interventions DME instruction;Gait training;Stair training;Functional mobility training;Therapeutic activities;Therapeutic exercise;Balance training;Patient/family education;Wheelchair mobility training   ? ?PT Goals (Current goals can be found in the Care Plan section)  ?Acute Rehab PT Goals ?Patient Stated Goal: decreased pain, return home ?PT  Goal Formulation: With patient ?Time For Goal Achievement: 12/05/21 ?Potential to Achieve Goals: Good ? ?  ?Frequency Min 5X/week ?  ? ? ?Co-evaluation PT/OT/SLP Co-Evaluation/Treatment: Yes ?Reason for Co-Treatment

## 2021-11-21 NOTE — Progress Notes (Signed)
Orthopedic Tech Progress Note ?Patient Details:  ?Thornell Mule Acevedo-Ocejo ?June 02, 1971 ?[id number] ? ?Ortho Devices ?Type of Ortho Device: Prafo boot/shoe ?Ortho Device/Splint Location: RLE ?Ortho Device/Splint Interventions: Ordered, Application ?  ?Post Interventions ?Patient Tolerated: Well ?Instructions Provided: Care of device ? ?Janit Pagan ?11/21/2021, 2:50 PM ? ?

## 2021-11-21 NOTE — Progress Notes (Signed)
Inpatient Rehab Admissions Coordinator Note:  ? ?Per PT recommendations patient was screened for CIR candidacy by Michel Santee, PT. At this time, pt appears to be a potential candidate for CIR. I will place an order for rehab consult for full assessment, per our protocol.  Please contact me any with questions.. ? ?Shann Medal, PT, DPT ?[phone] ?11/21/21 ?1:36 PM  ?

## 2021-11-21 NOTE — Progress Notes (Signed)
Inpatient Diabetes Program Recommendations ? ?AACE/ADA: New Consensus Statement on Inpatient Glycemic Control (2015) ? ?Target Ranges:  Prepandial:   less than 140 mg/dL ?     Peak postprandial:   less than 180 mg/dL (1-2 hours) ?     Critically ill patients:  140 - 180 mg/dL  ? ?Lab Results  ?Component Value Date  ? GLUCAP 324 (H) 11/21/2021  ? HGBA1C 7.0 (H) 11/18/2021  ? ? ?Review of Glycemic Control ? Latest Reference Range & Units 11/20/21 18:17 11/20/21 20:36 11/21/21 00:07 11/21/21 04:08 11/21/21 08:30 11/21/21 11:36  ?Glucose-Capillary 70 - 99 mg/dL 124 (H) 164 (H) 244 (H) 253 (H) 185 (H) 324 (H)  ? ?Diabetes history: DM 2 ?Outpatient Diabetes medications: Glucotrol 10 mg bid ?Current orders for Inpatient glycemic control:  ?Novolog 0-6 units q 4 hours ?Inpatient Diabetes Program Recommendations:   ? ?Note blood glucose increased after patient received Decadron 5 mg on 11/20/21.  Agree with current orders.  Blood sugars should improve today.   ? ?Thanks,  ?Adah Perl, RN, BC-ADM ?Inpatient Diabetes Coordinator ?Pager [phone]  (8a-5p) ? ? ? ?

## 2021-11-21 NOTE — Progress Notes (Signed)
Orthopaedic Trauma Progress Note ? ?SUBJECTIVE: Doing okay today, overall is sore and pain in the right lower leg and foot.  Also having some discomfort up in the left groin.  About to work with therapies.  No chest pain. No SOB. No nausea/vomiting. No other complaints.  Wife at bedside. ? ?OBJECTIVE:  ?Vitals:  ? 11/21/21 0425 11/21/21 0904  ?BP: (!) 119/97 (!) 147/69  ?Pulse: 66 95  ?Resp: 19 20  ?Temp:    ?SpO2: 100% (!) 80%  ? ? ?General: Sitting up in bed, no acute distress ?Respiratory: No increased work of breathing.  ?Left upper extremity: Long-arm splint in place.  Nontender above splint.  Able to wiggle fingers.  Endorses sensation to light touch over hand.  Fingers warm well perfused ?Left lower extremity: Well-padded, well fitting short leg splint in place.  Nontender above splint.  Endorses sensation to light touch over toes.  Able to wiggle toes on exam.  Toes warm with brisk cap refill  ?RLE: Dressing clean, dry, intact.  Tenderness about the knee as well as down through the foot.  Tolerates gentle ankle range of motion but does endorse some soreness with this.  Able to get knee fully extended.  Sensation intact distally.  Wiggles toes on his own. + DP pulse ? ?IMAGING: Stable post op imaging.  ? ?LABS:  ?Results for orders placed or performed during the hospital encounter of 11/17/21 (from the past 24 hour(s))  ?I-STAT, chem 8     Status: Abnormal  ? Collection Time: 11/20/21  2:18 PM  ?Result Value Ref Range  ? Sodium 134 (L) 135 - 145 mmol/L  ? Potassium 3.9 3.5 - 5.1 mmol/L  ? Chloride 96 (L) 98 - 111 mmol/L  ? BUN 26 (H) 6 - 20 mg/dL  ? Creatinine, Ser 5.80 (H) 0.61 - 1.24 mg/dL  ? Glucose, Bld 110 (H) 70 - 99 mg/dL  ? Calcium, Ion 1.16 1.15 - 1.40 mmol/L  ? TCO2 26 22 - 32 mmol/L  ? Hemoglobin 11.2 (L) 13.0 - 17.0 g/dL  ? HCT 33.0 (L) 39.0 - 52.0 %  ?Glucose, capillary     Status: Abnormal  ? Collection Time: 11/20/21  5:35 PM  ?Result Value Ref Range  ? Glucose-Capillary 108 (H) 70 - 99 mg/dL   ?Glucose, capillary     Status: Abnormal  ? Collection Time: 11/20/21  6:17 PM  ?Result Value Ref Range  ? Glucose-Capillary 124 (H) 70 - 99 mg/dL  ?Glucose, capillary     Status: Abnormal  ? Collection Time: 11/20/21  8:36 PM  ?Result Value Ref Range  ? Glucose-Capillary 164 (H) 70 - 99 mg/dL  ?Glucose, capillary     Status: Abnormal  ? Collection Time: 11/21/21 12:07 AM  ?Result Value Ref Range  ? Glucose-Capillary 244 (H) 70 - 99 mg/dL  ?Glucose, capillary     Status: Abnormal  ? Collection Time: 11/21/21  4:08 AM  ?Result Value Ref Range  ? Glucose-Capillary 253 (H) 70 - 99 mg/dL  ?Glucose, capillary     Status: Abnormal  ? Collection Time: 11/21/21  8:30 AM  ?Result Value Ref Range  ? Glucose-Capillary 185 (H) 70 - 99 mg/dL  ?Glucose, capillary     Status: Abnormal  ? Collection Time: 11/21/21 11:36 AM  ?Result Value Ref Range  ? Glucose-Capillary 324 (H) 70 - 99 mg/dL  ? ? ?ASSESSMENT: Blake Santana is a 51 y.o. male, 1 Day Post-Op s/p ?ORIF RIGHT TIBIAL PLATEAU ?ORIF LEFT ANKLE  PILON FRACTURE ?ORIF LEFT DISTAL RADIAL FRACTURE ?NON-OPERATIVE MANAGEMENT LEFT INFERIOR PUBIC RAMUS FRACTURE ? ?CV/Blood loss: Hemoglobin 11.2 perioperatively yesterday following 1 unit PRBCs.  CBC pending today.  Hemodynamically stable ? ?PLAN: ?Weightbearing:  ?- RUE: WBAT ?- LUE: NWB ?- RLE: NWB ?- LLE: NWB ?ROM:  ?- RUE: Unrestricted ROM ?- LUE: Okay for shoulder motion as tolerated.  Maintain long-arm splint ?- RLE: Unrestricted hip, knee, ankle ROM as tolerated ?- LLE: Maintain short leg splint.  Okay for unrestricted knee and hip ROM as tolerated ?Incisional and dressing care:   ?- LUE: Maintain splint.  Okay to make a window in Ace wrap for hemodialysis access ?- RLE: Plan to remove dressings 11/22/2021 and leave incisions open to air ?- LLE: Maintain short leg splint, keep clean and dry ?Showering: Okay for bed bath.  Hold off on showering for now ?Orthopedic device(s): Splint LUE, LLE ?Pain management:  ?1.  Tylenol 1000 mg q 6 hours scheduled ?2. Robaxin 500 mg q 6 hours PRN ?3. Oxycodone 5-15 mg q 4 hours PRN ?4. Dilaudid 0.5-1 mg q 3 hours PRN ?VTE prophylaxis:  Heparin ?ID: Ancef 2gm post op ?Foley/Lines:  No foley, KVO IVFs ?Impediments to Fracture Healing: Vitamin D level 21, start on D3 supplementation ?Dispo: PT/OT evaluation today, recommending CIR.  Plan to remove RLE dressing tomorrow.  Patient will likely remain nonweightbearing on bilateral lower extremities for at least 6 weeks. ? ?Follow - up plan: 2 weeks ? ? ?Contact information:  Katha Hamming MD, Rushie Nyhan PA-C. After hours and holidays please check [email or web address] for group call information for [facility] ? ? ?Gwinda Passe, PA-C ?([phone] (office) ?[email or web address] ? ? ? ?

## 2021-11-21 NOTE — Evaluation (Signed)
Occupational Therapy Evaluation ?Patient Details ?Name: Blake Santana ?MRN: [id number] ?DOB: Apr 09, 1971 ?Today's Date: 11/21/2021 ? ? ?History of Present Illness 51 yo s/p fall 20' off ladder with polytrauma - R tib plateau fx, prox phalanx fx R fifth toe, comminuted L inf pubic rami fx, L distal tib fib fx . 4/20 ORIF L pilon fx, R tib plateau fx, L distal radius fx and closed treatment of L elbow dislocation. PMH: ESRD on HD T Th Sat, DM, Diabetic retinopahty, HTN  ? ?Clinical Impression ?  ?Blake Santana used to interpret throughout session. PTA pt lives independently at home with his wife and drives himself to HD. Pt with significant functional decline due to deficits listed below. Recommend rehab at [facility] to maximize independence with ADL and functional transfers while following WBS to facilitate safe DC home @ wc level with 24/7 support.  ?   ? ?Recommendations for follow up therapy are one component of a multi-disciplinary discharge planning process, led by the attending physician.  Recommendations may be updated based on patient status, additional functional criteria and insurance authorization.  ? ?Follow Up Recommendations ? Acute inpatient rehab (3hours/day)  ?  ?Assistance Recommended at Discharge Frequent or constant Supervision/Assistance  ?Patient can return home with the following Two people to help with walking and/or transfers;Two people to help with bathing/dressing/bathroom;Assist for transportation;Help with stairs or ramp for entrance ? ?  ?Functional Status Assessment ? Patient has had a recent decline in their functional status and demonstrates the ability to make significant improvements in function in a reasonable and predictable amount of time.  ?Equipment Recommendations ? BSC/3in1;Wheelchair (measurements OT);Wheelchair cushion (measurements OT);Santana bed;Other (comment) Harrel Lemon)  ?  ?Recommendations for Other Services   ? ? ?  ?Precautions / Restrictions Precautions ?Precautions:  Fall ?Required Braces or Orthoses: Splint/Cast ?Splint/Cast: L ankle ?Restrictions ?Weight Bearing Restrictions: Yes ?LUE Weight Bearing: Non weight bearing ?RLE Weight Bearing: Non weight bearing ?LLE Weight Bearing: Non weight bearing ?Other Position/Activity Restrictions: Ortho PA verbal clarification - unrestricted AROM L shoulder, L knee/hip, R ankle/knee/hip; pt NOT to WB through L elbow  ? ?  ? ?Mobility Bed Mobility ?Overal bed mobility: Needs Assistance ?  ?  ?  ?Supine to sit: +2 for physical assistance, Max assist ?Sit to supine: +2 for physical assistance, Max assist ?  ?General bed mobility comments: pt able to come to sitting on R-side bed with use of RUE on bed rail and maxA+2 for BLE management, trunk elevation and scooting hips to EOB; return to long sitting in bed with maxA+2, pt able to maintain unsupported long sitting, maxA+2 to scoot hips up in bed ?  ? ?Transfers ?  ?  ?  ?  ?  ?  ?  ?  ?  ?General transfer comment: will likely require maximove lift or maxA+2 for anterior-posterior transfer due to WB restrictions ?  ? ?  ?Balance Overall balance assessment: Needs assistance ?  ?Sitting balance-Leahy Scale: Fair ?  ?  ?  ?  ?  ?  ?  ?  ?  ?  ?  ?  ?  ?  ?  ?  ?   ? ?ADL either performed or assessed with clinical judgement  ? ?ADL Overall ADL's : Needs assistance/impaired ?  ?  ?Grooming: Minimal assistance ?  ?Upper Body Bathing: Minimal assistance ?  ?Lower Body Bathing: Maximal assistance;Bed level ?  ?Upper Body Dressing : Maximal assistance ?  ?Lower Body Dressing: Maximal assistance;Bed level ?  ?Toilet  Transfer:  (did not attempt; will be +2) ?  ?  ?  ?  ?  ?Functional mobility during ADLs:  (not attempted OOB) ?   ? ? ? ?Vision Baseline Vision/History: 5 Retinopathy ?Additional Comments: will further address given hsx of diabetic retinopathy  ?   ?Perception   ?  ?Praxis   ?  ? ?Pertinent Vitals/Pain Pain Assessment ?Pain Assessment: 0-10 ?Pain Score: 9  ?Pain Location: from pelvis to  toes ?Pain Descriptors / Indicators: Discomfort, Grimacing, Guarding, Sore  ? ? ? ?Hand Dominance Right ?  ?Extremity/Trunk Assessment Upper Extremity Assessment ?Upper Extremity Assessment: RUE deficits/detail;LUE deficits/detail ?RUE Deficits / Details: s/p elbow dislocation reduction; functionally >4/5 strength throughout, denies pain with ROM ?LUE Deficits / Details: s/p L distal radius ORIF in cast; able to partially flex/ext fingers, noted swelling; L shoulder with good AROM although painful, gross function at least 3/5 ?  ?Lower Extremity Assessment ?Lower Extremity Assessment: RLE deficits/detail;LLE deficits/detail ?RLE Deficits / Details: s/p R pilon ORIF with expected post-op pain and weakness; minimal ankle/knee activation noted, limited by significant pain, able to minimally wiggle toes; suspect seated hip AROM mainly limited by pain from pubic rami fx ?RLE: Unable to fully assess due to pain;Unable to fully assess due to immobilization ?LLE Deficits / Details: s/p L tibial ORIF with lower leg/ankle cast; able to minimally wiggle toes; L hip and knee grossly >3/5 throughout ?LLE: Unable to fully assess due to pain;Unable to fully assess due to immobilization ?  ?Cervical / Trunk Assessment ?Cervical / Trunk Assessment: Other exceptions ?  ?Communication Communication ?Communication: Prefers language other than [place];Interpreter utilized ?  ?Cognition Arousal/Alertness: Awake/alert ?Behavior During Therapy: [facility] for tasks assessed/performed ?Overall Cognitive Status: Within Functional Limits for tasks assessed ?  ?  ?  ?  ?  ?  ?  ?  ?  ?  ?  ?  ?  ?  ?  ?  ?  ?  ?  ?General Comments  Session performed via in-person Spanish interpreter Blake Santana). Pt's wife present and supportive. Initiated educ re: precautions, positioning, DVT prevention, edema control, therex/ROM, DME needs, d/c planning. Ortho PA Blake Santana) present at beginning of session, able to clarify activity restrictions. SpO2 95-100% on RA, HR  70s-80s ? ?  ?Exercises Exercises: Other exercises ?Other Exercises ?Other Exercises: incentive spirometer x 5 ?  ?Shoulder Instructions    ? ? ?Home Living Family/patient expects to be discharged to:: Private residence ?Living Arrangements: Spouse/significant other ?Available Help at Discharge: Family;Friend(s);Available 24 hours/day ?Type of Home: House ?Home Access: Stairs to enter ?Entrance Stairs-Number of Steps: 4 ?Entrance Stairs-Rails: None ?Home Layout: One level ?  ?  ?Bathroom Shower/Tub: Tub/shower unit;Curtain ?  ?Bathroom Toilet: Standard ?Bathroom Accessibility: No ?  ?Home Equipment: Kasandra Knudsen - single point ?  ?Additional Comments: encouraged pt and wife to start asking around if friends/family can build ramp ?  ? ?  ?Prior Functioning/Environment Prior Level of Function : Independent/Modified Independent;Driving ?  ?  ?  ?  ?  ?  ?Mobility Comments: Independent with intermittent use of SPC due to RLE gout. Drives self to HD appts (TTS). Does not work; enjoys yard work, Building services engineer, being on phone ?ADLs Comments: independent with ADL/iADLs ?  ? ?  ?  ?OT Problem List: Decreased strength;Decreased range of motion;Decreased activity tolerance;Impaired balance (sitting and/or standing);Decreased knowledge of use of DME or AE;Decreased knowledge of precautions;Pain;Impaired UE functional use;Increased edema ?  ?   ?OT Treatment/Interventions: Self-care/ADL training;Therapeutic exercise;DME and/or  AE instruction;Therapeutic activities;Patient/family education;Balance training  ?  ?OT Goals(Current goals can be found in the care plan section) Acute Rehab OT Goals ?Patient Stated Goal: to be able to go home ?OT Goal Formulation: With patient/family ?Time For Goal Achievement: 12/04/21 ?Potential to Achieve Goals: Good  ?OT Frequency: Min 2X/week ?  ? ?Co-evaluation PT/OT/SLP Co-Evaluation/Treatment: Yes ?Reason for Co-Treatment: Complexity of the patient's impairments (multi-system involvement);For  patient/therapist safety;To address functional/ADL transfers ?PT goals addressed during session: Mobility/safety with mobility;Balance ?OT goals addressed during session: ADL's and self-care ?  ? ?  ?AM-PAC OT "6 Cl

## 2021-11-22 LAB — CBC
HCT: 28.9 % — ABNORMAL LOW (ref 39.0–52.0)
Hemoglobin: 9.7 g/dL — ABNORMAL LOW (ref 13.0–17.0)
MCH: 30.8 pg (ref 26.0–34.0)
MCHC: 33.6 g/dL (ref 30.0–36.0)
MCV: 91.7 fL (ref 80.0–100.0)
Platelets: 163 10*3/uL (ref 150–400)
RBC: 3.15 MIL/uL — ABNORMAL LOW (ref 4.22–5.81)
RDW: 14.6 % (ref 11.5–15.5)
WBC: 9.7 10*3/uL (ref 4.0–10.5)
nRBC: 0.2 % (ref 0.0–0.2)

## 2021-11-22 LAB — BASIC METABOLIC PANEL
Anion gap: 17 — ABNORMAL HIGH (ref 5–15)
BUN: 78 mg/dL — ABNORMAL HIGH (ref 6–20)
CO2: 23 mmol/L (ref 22–32)
Calcium: 9.8 mg/dL (ref 8.9–10.3)
Chloride: 92 mmol/L — ABNORMAL LOW (ref 98–111)
Creatinine, Ser: 9.76 mg/dL — ABNORMAL HIGH (ref 0.61–1.24)
GFR, Estimated: 6 mL/min — ABNORMAL LOW (ref >60)
Glucose, Bld: 193 mg/dL — ABNORMAL HIGH (ref 70–99)
Potassium: 4.2 mmol/L (ref 3.5–5.1)
Sodium: 132 mmol/L — ABNORMAL LOW (ref 135–145)

## 2021-11-22 LAB — GLUCOSE, CAPILLARY
Glucose-Capillary: 128 mg/dL — ABNORMAL HIGH (ref 70–99)
Glucose-Capillary: 146 mg/dL — ABNORMAL HIGH (ref 70–99)
Glucose-Capillary: 167 mg/dL — ABNORMAL HIGH (ref 70–99)
Glucose-Capillary: 196 mg/dL — ABNORMAL HIGH (ref 70–99)
Glucose-Capillary: 213 mg/dL — ABNORMAL HIGH (ref 70–99)

## 2021-11-22 MED ORDER — ROSUVASTATIN CALCIUM 20 MG PO TABS
20.0000 mg | ORAL_TABLET | Freq: Every day | ORAL | Status: DC
  Administered 2021-11-22 – 2021-11-27 (×6): 20 mg via ORAL
  Filled 2021-11-22 (×7): qty 1

## 2021-11-22 MED ORDER — LIDOCAINE HCL (PF) 1 % IJ SOLN: 5.0000 mL | INTRAMUSCULAR | Status: DC | PRN

## 2021-11-22 MED ORDER — ALTEPLASE 2 MG IJ SOLR: 2.0000 mg | Freq: Once | INTRAMUSCULAR | Status: DC | PRN

## 2021-11-22 MED ORDER — GLIPIZIDE 5 MG PO TABS
10.0000 mg | ORAL_TABLET | Freq: Every day | ORAL | Status: DC
  Administered 2021-11-22 – 2021-11-24 (×3): 10 mg via ORAL
  Filled 2021-11-22: qty 2
  Filled 2021-11-22: qty 1
  Filled 2021-11-22: qty 2

## 2021-11-22 MED ORDER — METOPROLOL TARTRATE 12.5 MG HALF TABLET
12.5000 mg | ORAL_TABLET | Freq: Two times a day (BID) | ORAL | Status: DC
  Administered 2021-11-22 – 2021-11-27 (×10): 12.5 mg via ORAL
  Filled 2021-11-22 (×11): qty 1

## 2021-11-22 MED ORDER — HEPARIN SODIUM (PORCINE) 1000 UNIT/ML DIALYSIS: 1000.0000 [IU] | INTRAMUSCULAR | Status: DC | PRN

## 2021-11-22 MED ORDER — SODIUM CHLORIDE 0.9 % IV SOLN: 100.0000 mL | INTRAVENOUS | Status: DC | PRN

## 2021-11-22 MED ORDER — PENTAFLUOROPROP-TETRAFLUOROETH EX AERO: 1.0000 "application " | INHALATION_SPRAY | CUTANEOUS | Status: DC | PRN

## 2021-11-22 MED ORDER — LIDOCAINE-PRILOCAINE 2.5-2.5 % EX CREA: 1.0000 "application " | TOPICAL_CREAM | CUTANEOUS | Status: DC | PRN

## 2021-11-22 NOTE — Progress Notes (Signed)
? ? ? [facility] ?                                                                            ? ? ?[name], is a 51 y.o. male, DOB - [birth year], KVQ:[id number] ?Admit date - 11/17/2021    ?Outpatient Primary MD for the patient is [name], NP ? ?LOS - 5  days ? ? ? ?Brief summary  ? ?Blake Santana is a Spanish-speaking 51 y.o. male with medical history significant for ESRD on TTS HD, T2DM with diabetic retinopathy, anemia of ESRD, HTN, HLD, PAD who is admitted with multiple skeletal fractures after fall from 20 foot height.  Orthopedics consulting for further management. ? ? ?Assessment & Plan  ? ? ?Assessment and Plan: ?Closed fracture of inferior pubic ramus, left,  ?Acute comminuted fracture of the left distal fibula and tibia with extension to the medial malleolus; possible left calcaneal fracture ?Acute comminuted fracture left distal radius ?Mildly displaced fracture of the base of proximal phalanx of right fifth toe ?Possible proximal right tibial fracture ?Posterior dislocation of the left elbow, s/p reduction in the ED ?Traumatic fall from 20 foot height ?Orthopedics consulted, s/p ORIF of the right tibial plateau,,ORIF of the left ankle , ORIF of the left distal radial fracture . ?He should be non weight bearing on the left upper extremity left lower extremity and right lower extremity for at least 6 weeks.  ?Pain control with tylenol, oxycodone  and dilaudid along with Robaxin.  ?Therapy eval recommending CIR.  ?Recommend outpatient follow up with orthopedics in 2 weeks.  ? ?ESRD on hemodialysis TTS ([place]) ?Nephrology on board for HD.  ?Plan for HD today.  ? ?Type 2 diabetes mellitus with complication, without long-term current use of insulin ([place]) ?CBG (last 3)  ?Recent Labs  ?  11/22/21 ?0031 11/22/21 ?0414 11/22/21 ?1210  ?GLUCAP 196* 167* 146*  ? ? ?Hemoglobin A1c is 7. Continue with SSI.  ?Will start him on novolog [facility].  ? ?Hypertension associated with diabetes  ([place]) ?BP elevated in setting of uncontrolled pain from multiple skeletal fractures.  Continue amlodipine, Lopressor, analgesics as needed. ? ?Anemia of chronic kidney failure ?Anemia of blood loss from the surgery.  ?S/p 2 units of prbc transfusion.  ?Hemoglobin around 9.7 today.  ? ? ?Mild hyponatremia:  ?Probably from ESRD.  ? ? ?Hyperlipidemia associated with type 2 diabetes mellitus ([place]) ?Hold statin for now. ? ? ?Estimated body mass index is 25.26 kg/m? as calculated from the following: ?  Height as of this encounter: '5\' 6"'$  (1.676 m). ?  Weight as of this encounter: 71 kg. ? ?Code Status: full code.  ?DVT Prophylaxis:  heparin injection 5,000 Units Start: 11/20/21 2215 ?SCDs Start: 11/20/21 2108 ?SCDs Start: 11/17/21 2158 ? ? ?Level of Care: Level of care: Med-Surg ?Family Communication: none at bedside.  ? ?Disposition Plan:     Remains inpatient appropriate:  CIR  ? ?Procedures:  ?ORIF TO R tibial.  ? ?Consultants:   ?Orthopedics ?Nephrology.  ? ?Antimicrobials:  ? ?Anti-infectives (From admission, onward)  ? ? Start     Dose/Rate Route Frequency Ordered Stop  ? 11/20/21 2200  ceFAZolin (ANCEF) IVPB 2g/100 mL  premix  Status:  Discontinued       ? 2 g ?200 mL/hr over 30 Minutes Intravenous Every 8 hours 11/20/21 2107 11/20/21 2110  ? 11/20/21 1638  vancomycin (VANCOCIN) powder  Status:  Discontinued       ?   As needed 11/20/21 1639 11/20/21 1808  ? ?  ? ? ? ?Medications ? ?Scheduled Meds: ? (feeding supplement) PROSource Plus  30 mL Oral BID BM  ? sodium chloride   Intravenous Once  ? sodium chloride   Intravenous Once  ? amLODipine  10 mg Oral Daily  ? Chlorhexidine Gluconate Cloth  6 each Topical Q0600  ? Chlorhexidine Gluconate Cloth  6 each Topical Q0600  ? cholecalciferol  2,000 Units Oral Daily  ? cinacalcet  60 mg Oral Q supper  ? darbepoetin (ARANESP) injection - DIALYSIS  60 mcg Intravenous Q Thu-HD  ? docusate sodium  100 mg Oral BID  ? doxercalciferol  1 mcg Intravenous Q T,Th,Sa-HD  ? glipiZIDE   10 mg Oral QAC breakfast  ? heparin injection (subcutaneous)  5,000 Units Subcutaneous Q8H  ? insulin aspart  0-6 Units Subcutaneous Q4H  ? insulin aspart  2 Units Subcutaneous TID WC  ? lanthanum  2,000 mg Oral TID WC  ? metoprolol tartrate  12.5 mg Oral BID  ? rosuvastatin  20 mg Oral Daily  ? ?Continuous Infusions: ? methocarbamol (ROBAXIN) IV    ? sodium chloride    ? ?PRN Meds:.diphenhydrAMINE, hydrALAZINE, HYDROmorphone (DILAUDID) injection, methocarbamol **OR** methocarbamol (ROBAXIN) IV, oxyCODONE, oxyCODONE, polyethylene glycol, senna-docusate, sodium chloride ? ? ? ?Subjective:  ? ?Blake Santana was seen and examined today.  No new complaints.  ? ?Objective:  ? ?Vitals:  ? 11/22/21 1030 11/22/21 1100 11/22/21 1130 11/22/21 1213  ?BP: (!) 109/52 126/63 (!) 105/56 138/73  ?Pulse: 69 74 74 80  ?Resp: '18 19 16 16  '$ ?Temp:    98.8 ?F (37.1 ?C)  ?TempSrc:    Oral  ?SpO2:    92%  ?Weight:      ?Height:      ? ? ?Intake/Output Summary (Last 24 hours) at 11/22/2021 1411 ?Last data filed at 11/22/2021 0429 ?Gross per 24 hour  ?Intake 240 ml  ?Output --  ?Net 240 ml  ? ? ?Filed Weights  ? 11/20/21 0825 11/20/21 1204 11/22/21 0730  ?Weight: 64.3 kg 63 kg 71 kg  ? ? ? ?Exam ?General exam: Appears calm and comfortable  ?Respiratory system: Clear to auscultation. Respiratory effort normal. ?Cardiovascular system: S1 & S2 heard, RRR. No JVD,  No pedal edema. ?Gastrointestinal system: Abdomen is nondistended, soft and nontender. Normal bowel sounds heard. ?Central nervous system: Alert and oriented. No focal neurological deficits. ?Extremities: right lower extremity and left UE in case.  ?Skin: No rashes, lesions or ulcers ?Psychiatry:  Mood & affect appropriate.  ? ? ? ?Data Reviewed:  I have personally reviewed following labs and imaging studies ? ? ?CBC ?Lab Results  ?Component Value Date  ? WBC 9.7 11/22/2021  ? RBC 3.15 (L) 11/22/2021  ? HGB 9.7 (L) 11/22/2021  ? HCT 28.9 (L) 11/22/2021  ? MCV 91.7 11/22/2021  ?  MCH 30.8 11/22/2021  ? PLT 163 11/22/2021  ? MCHC 33.6 11/22/2021  ? RDW 14.6 11/22/2021  ? LYMPHSABS 1.4 11/20/2021  ? MONOABS 1.0 11/20/2021  ? EOSABS 0.1 11/20/2021  ? BASOSABS 0.0 11/20/2021  ? ? ? ?Last metabolic panel ?Lab Results  ?Component Value Date  ? NA 132 (L) 11/22/2021  ?  K 4.2 11/22/2021  ? CL 92 (L) 11/22/2021  ? CO2 23 11/22/2021  ? BUN 78 (H) 11/22/2021  ? CREATININE 9.76 (H) 11/22/2021  ? GLUCOSE 193 (H) 11/22/2021  ? GFRNONAA 6 (L) 11/22/2021  ? GFRAA 11 (L) 04/02/2020  ? CALCIUM 9.8 11/22/2021  ? PHOS 9.1 (H) 11/20/2021  ? PROT 7.3 11/17/2021  ? ALBUMIN 3.1 (L) 11/20/2021  ? LABGLOB 3.2 01/15/2021  ? AGRATIO 1.4 01/15/2021  ? BILITOT 0.7 11/17/2021  ? ALKPHOS 289 (H) 11/17/2021  ? AST 19 11/17/2021  ? ALT 16 11/17/2021  ? ANIONGAP 17 (H) 11/22/2021  ? ? ?CBG (last 3)  ?Recent Labs  ?  11/22/21 ?0031 11/22/21 ?0414 11/22/21 ?1210  ?GLUCAP 196* 167* 146*  ? ?  ? ? ?Coagulation Profile: ?Recent Labs  ?Lab 11/17/21 ?1634  ?INR 1.1  ? ? ? ? ?Radiology Studies: ?DG Wrist 2 Views Left ? ?Result Date: 11/20/2021 ?CLINICAL DATA:  Left wrist, right knee and left ankle post ORIF films. EXAM: LEFT WRIST - 2 VIEW; LEFT ANKLE COMPLETE - 3+ VIEW; PORTABLE RIGHT KNEE - 1-2 VIEW COMPARISON:  Preoperative x-rays of the left wrist, right knee and left ankle dated 11/17/2021. FINDINGS: Left wrist: AP, lateral and oblique views in a cast are provided. There has been ventral ORIF plate fixation based against the ventral distal radial shaft and metaphysis, with multiple short securing screws. Post ORIF fracture alignment approximates anatomic. No new fracture has become apparent. A slightly laterally displaced ulnar styloid fracture is again shown and unchanged. There are extensive vascular calcifications in the forearm, wrist and hand, including with calcifications extending into the digital arteries. Generalized soft tissue swelling appears similar. Right knee: Routine four views are provided. There is an air-fluid  level in the suprapatellar bursal space. There is scattered linear subcutaneous soft tissue gas laterally and a lateral sideplate ORIF with threaded securing screws against the lateral aspect of the p

## 2021-11-22 NOTE — Progress Notes (Addendum)
Inpatient Rehab Admissions: ? ?Inpatient Rehab Consult received.  I met with patient at the bedside for rehabilitation assessment and to discuss goals and expectations of an inpatient rehab admission.  Utilized the [employer] [phone]. Pt acknowledged understanding of CIR goals and expectations. Pt interested in pursuing CIR. Pt informed AC that wife will be able to provide 24/7 support after discharge. Pt requested AC contact financial advisor d/t pt being uninsured. Left a message for Niger Ellerbee.  ?When returned to pt's room with CIR booklet in [place], pt's wife Regino Schultze and children were in the room. Pt's son interpreted conversation regarding CIR goals and expectations to pt's wife. She acknowledged understanding and is supportive of pt pursuing CIR. She confirmed that she will be able to provide 24/7 supervision for pt after discharge. Will continue to follow.  ? ?Signed: ?Blake Curry, MS, CCC-SLP ?Admissions Coordinator ?[phone] ? ? ?

## 2021-11-22 NOTE — PMR Pre-admission (Incomplete)
PMR Admission Coordinator Pre-Admission Assessment ? ?Patient: Blake Santana is an 51 y.o., male ?MRN: [id number] ?DOB: 1971-01-26 ?Height: '5\' 6"'$  (167.6 cm) ?Weight: 69 kg ? ?Insurance Information ?HMO:     PPO:      PCP:      IPA:      80/20:      OTHER:  ?PRIMARY: UNINSURED      Policy#:       Subscriber:  ?CM Name:       Phone#:      Fax#:  ?Pre-Cert#:       Employer:  ?Benefits:  Phone #:      Name:  ?Eff. Date:      Deduct:       Out of Pocket Max:       Life Max:  ?CIR:       SNF:  ?Outpatient:      Co-Pay:  ?Home Health:       Co-Pay:  ?DME:      Co-Pay:  ?Providers:  ?SECONDARY:       Policy#:      Phone#:  ? ?Financial Counselor: Niger Ellerbee      Phone#: [phone] ? ?The ?Data Collection Information Summary? for patients in Inpatient Rehabilitation Facilities with attached ?Privacy Act [place] Records? was provided and verbally reviewed with: N/A ? ?Emergency Contact Information ?Contact Information   ? ? Name Relation Home Work Mobile  ? Susa Griffins Other   [phone]  ? [facility]  [phone]   ? [place],Wife Spouse   [phone]  ? Wynelle Link Daughter   [phone]  ? ?  ? ? ?Current Medical History  ?Patient Admitting Diagnosis: s/p fall with polytrauma ?History of Present Illness: Pt is a 51 year old male with medical hx significant for: ESRD on TTS HD, DM II with diabetic retinopathy, anemia of ESRD, HTN, HLE, PAD. Pt presented to hospital on 11/17/21 after a fall. Pt on a ladder cutting branches when he was hit by a branch on the chest and fell 20 feet. CT imaging concerning for inferior pubic ramus fx of left. CT head and cervical spine unremarkable. Extremity films concerning for multiple injuries. Pt found to have an elbow dislocation posteriorly; reduction performed and arm placed in splint. Pt also found to have mildly displaced fx of the fifth proximal phalanx, concern for tibial plateau fx on the right, bimalleolar ankle fx on  the left, possible calcaneal fx, comminuted mildly impacted intra-articular left distal radius fx with an acute left ulnar styloid fx. On 11/20/21, pt underwent ORIF right tibial plateau fx, left ankle pilon fx, and left distal radial fx and closed treatment of left elbow dislocation. Therapy evaluations completed and CIR recommended d/t pt's deficits in functional mobility and inability to complete ADLs independently.  ?  ? ?Patient's medical record from [facility] has been reviewed by the rehabilitation admission coordinator and physician. ? ?Past Medical History  ?Past Medical History:  ?Diagnosis Date  ? Acute respiratory failure with hypoxia ([place]) 09/07/2018  ? Anemia of chronic kidney failure 03/11/2017  ? Cataracts, bilateral 2019  ? Chest pain 09/01/2018  ? CHF (congestive heart failure) ([place])   ? Chronic kidney disease 03/11/2017  ? Diabetes mellitus without complication ([place]) 0973  ? Edema 01/14/2017  ? Headache   ? Hypercholesterolemia   ? Hypertension 03/11/2017  ? Hypertensive retinopathy of both eyes 2019  ? Iron deficiency anemia 06/18/2016  ? Pneumonia 09/07/2018  ? Proliferative diabetic retinopathy with macular edema (  [place]) 2019  ? Retinal hemorrhage, bilateral and left vitreous hemorrhage.  Being treated with Lucent[facility].  Dr. Iona Hansen  ? RSV (respiratory syncytial virus infection) 08/2017  ? ? ?Has the patient had major surgery during 100 days prior to admission? Yes ? ?Family History   ?family history includes Alcohol abuse in his father; Diabetes in his father, mother, and sister; Hypertension in his mother; Kidney disease in his father and mother. ? ?Current Medications ? ?Current Facility-Administered Medications:  ?  (feeding supplement) PROSource Plus liquid 30 mL, 30 mL, Oral, BID BM, McClung, Sarah A, PA-C, 30 mL at 11/22/21 1307 ?  0.9 %  sodium chloride infusion (Manually program via Guardrails IV Fluids), , Intravenous, Once, McClung, Sarah A, PA-C ?  0.9 %  sodium chloride infusion  (Manually program via Guardrails IV Fluids), , Intravenous, Once, McClung, Sarah A, PA-C ?  amLODipine (NORVASC) tablet 10 mg, 10 mg, Oral, Daily, Hosie Poisson, MD, 10 mg at 11/22/21 1308 ?  Chlorhexidine Gluconate Cloth 2 % PADS 6 each, 6 each, Topical, Q0600, Corinne Ports, PA-C, 6 each at 11/21/21 9675 ?  Chlorhexidine Gluconate Cloth 2 % PADS 6 each, 6 each, Topical, Q0600, Penninger, [place], [place], 6 each at 11/22/21 0515 ?  cholecalciferol (VITAMIN D3) tablet 2,000 Units, 2,000 Units, Oral, Daily, Corinne Ports, PA-C, 2,000 Units at 11/22/21 1308 ?  cinacalcet (SENSIPAR) tablet 60 mg, 60 mg, Oral, Q supper, Corinne Ports, PA-C, 60 mg at 11/21/21 1615 ?  Darbepoetin Alfa (ARANESP) injection 60 mcg, 60 mcg, Intravenous, Q Thu-HD, Corinne Ports, PA-C, 60 mcg at 11/20/21 1058 ?  diphenhydrAMINE (BENADRYL) 12.5 MG/5ML elixir 12.5-25 mg, 12.5-25 mg, Oral, Q4H PRN, McClung, Sarah A, PA-C ?  docusate sodium (COLACE) capsule 100 mg, 100 mg, Oral, BID, Corinne Ports, PA-C, 100 mg at 11/22/21 1308 ?  doxercalciferol (HECTOROL) injection 1 mcg, 1 mcg, Intravenous, Q T,Th,Sa-HD, Corinne Ports, PA-C, 1 mcg at 11/22/21 9163 ?  glipiZIDE (GLUCOTROL) tablet 10 mg, 10 mg, Oral, QAC breakfast, Karleen Hampshire, Vijaya, MD, 10 mg at 11/22/21 1317 ?  heparin injection 5,000 Units, 5,000 Units, Subcutaneous, Q8H, von Dohlen, Haley B, RPH, 5,000 Units at 11/22/21 1307 ?  hydrALAZINE (APRESOLINE) tablet 25 mg, 25 mg, Oral, Q8H PRN, Hosie Poisson, MD ?  HYDROmorphone (DILAUDID) injection 0.5-1 mg, 0.5-1 mg, Intravenous, Q3H PRN, Corinne Ports, PA-C, 1 mg at 11/20/21 2251 ?  insulin aspart (novoLOG) injection 0-6 Units, 0-6 Units, Subcutaneous, Q4H, Corinne Ports, PA-C, 1 Units at 11/22/21 8466 ?  insulin aspart (novoLOG) injection 2 Units, 2 Units, Subcutaneous, TID WC, Hosie Poisson, MD, 2 Units at 11/22/21 1309 ?  lanthanum (FOSRENOL) chewable tablet 2,000 mg, 2,000 mg, Oral, TID WC, McClung, Sarah A, PA-C, 2,000 mg at  11/22/21 1307 ?  methocarbamol (ROBAXIN) tablet 500 mg, 500 mg, Oral, Q6H PRN, 500 mg at 11/21/21 1106 **OR** methocarbamol (ROBAXIN) 500 mg in dextrose 5 % 50 mL IVPB, 500 mg, Intravenous, Q6H PRN, McClung, Sarah A, PA-C ?  metoprolol tartrate (LOPRESSOR) tablet 12.5 mg, 12.5 mg, Oral, BID, Hosie Poisson, MD, 12.5 mg at 11/22/21 1308 ?  oxyCODONE (Oxy IR/ROXICODONE) immediate release tablet 10-15 mg, 10-15 mg, Oral, Q4H PRN, Corinne Ports, PA-C, 15 mg at 11/22/21 1312 ?  oxyCODONE (Oxy IR/ROXICODONE) immediate release tablet 5-10 mg, 5-10 mg, Oral, Q4H PRN, Corinne Ports, PA-C, 10 mg at 11/21/21 0403 ?  polyethylene glycol (MIRALAX / GLYCOLAX) packet 17 g, 17 g, Oral, Daily PRN, Rushie Nyhan  A, PA-C, 17 g at 11/22/21 1307 ?  rosuvastatin (CRESTOR) tablet 20 mg, 20 mg, Oral, Daily, Hosie Poisson, MD, 20 mg at 11/22/21 4680 ?  senna-docusate (Senokot-S) tablet 1 tablet, 1 tablet, Oral, QHS PRN, McClung, Sarah A, PA-C ?  sodium chloride 0.9 % bolus 500 mL, 500 mL, Intravenous, Once PRN, Thereasa Solo, Leary Roca, PA-C ? ?Patients Current Diet:  ?Diet Order   ? ?       ?  Diet renal with fluid restriction Fluid restriction: 1200 mL Fluid; Room service appropriate? Yes; Fluid consistency: Thin  Diet effective now       ?  ? ?  ?  ? ?  ? ? ?Precautions / Restrictions ?Precautions ?Precautions: Fall ?Restrictions ?Weight Bearing Restrictions: Yes ?LUE Weight Bearing: Non weight bearing ?RLE Weight Bearing: Non weight bearing ?LLE Weight Bearing: Non weight bearing ?Other Position/Activity Restrictions: Ortho PA verbal clarification - unrestricted AROM L shoulder, L knee/hip, R ankle/knee/hip; pt NOT to WB through L elbow  ? ?Has the patient had 2 or more falls or a fall with injury in the past year? Yes ? ?Prior Activity Level ?Limited Community (1-2x/wk): drives to dialysis 3 days/week ? ?Prior Functional Level ?Self Care: Did the patient need help bathing, dressing, using the toilet or eating? Needed some help ? ?Indoor  Mobility: Did the patient need assistance with walking from room to room (with or without device)? Independent ? ?Stairs: Did the patient need assistance with internal or external stairs (with or without device)

## 2021-11-22 NOTE — Plan of Care (Signed)

## 2021-11-22 NOTE — Progress Notes (Signed)
removed 557ms net fluid. unable to remove more due to hypotension and patient clotted mid tx unable to recapture extra fluid.  pre bp 172/79 post bp 125/63 pre weight 71.0kg post weight 70.0kg bed scales inaccurate pt has multiple cast on legs and arm.  difficulty with cannulation this morning due to cast covering fistula all the way up had to cut cast in middle on left upper arm tin order to place needles for dialysis.  gave hectrol as ordered.  2 bandages to lua avf no bleeding dressing cdi ?

## 2021-11-22 NOTE — Progress Notes (Addendum)
?[facility] ?Progress Note  ? ?Subjective:   Patient seen and examined at bedside in dialysis.  Tolerating dialysis well so far.  Nurse had to upwrap upper LUE dressing and cut portion of cast to expose fistula for cannulation to facilitate dialysis.  L elbow remains stabilized.   Pain a little better today.  Denies CP, SOB, and n/v/d.  Being evaluated for CIR.  ? ?Objective ?Vitals:  ? 11/22/21 0801 11/22/21 0830 11/22/21 0930 11/22/21 1000  ?BP: (!) 172/85 (!) 125/57 122/62 134/62  ?Pulse: 71 71 71 72  ?Resp: (!) 23 (!) 23    ?Temp:      ?TempSrc:      ?SpO2: 92%     ?Weight:      ?Height:      ? ?Physical Exam ?General:injured, well appearing male in NAD ?Heart:RRR, no mrg ?Lungs:CTAB, nml WOB  ?Abdomen:soft, NTND ?Extremities:no LE edema ?Dialysis Access: LU AVF cannulated  ? ?Filed Weights  ? 11/20/21 0825 11/20/21 1204 11/22/21 0730  ?Weight: 64.3 kg 63 kg 71 kg  ? ? ?Intake/Output Summary (Last 24 hours) at 11/22/2021 1009 ?Last data filed at 11/22/2021 0429 ?Gross per 24 hour  ?Intake 590 ml  ?Output --  ?Net 590 ml  ? ? ?Additional Objective ?Labs: ?Basic Metabolic Panel: ?Recent Labs  ?Lab 11/18/21 ?0421 11/19/21 ?1250 11/20/21 ?9518 11/20/21 ?1418 11/22/21 ?0146  ?NA 134* 136 137 134* 132*  ?K 5.6* 5.3* 5.8* 3.9 4.2  ?CL 93* 95* 94* 96* 92*  ?CO2 '23 22 23  '$ --  23  ?GLUCOSE 174* 180* 145* 110* 193*  ?BUN 83* 71* 94* 26* 78*  ?CREATININE 11.33* 11.31* 13.24* 5.80* 9.76*  ?CALCIUM 8.6* 9.4 9.4  --  9.8  ?PHOS 6.3*  --  9.1*  --   --   ? ?Liver Function Tests: ?Recent Labs  ?Lab 11/17/21 ?1634 11/20/21 ?0843  ?AST 19  --   ?ALT 16  --   ?ALKPHOS 289*  --   ?BILITOT 0.7  --   ?PROT 7.3  --   ?ALBUMIN 3.6 3.1*  ? ? ?CBC: ?Recent Labs  ?Lab 11/17/21 ?1634 11/17/21 ?1707 11/18/21 ?0421 11/19/21 ?1250 11/20/21 ?8416 11/20/21 ?6063 11/20/21 ?1418 11/22/21 ?0146  ?WBC 12.2*  --  10.7* 10.1  --  9.3  --  9.7  ?NEUTROABS  --   --   --  7.3  --  6.8  --   --   ?HGB 10.7*   < > 8.4* 7.8*   < > 8.2* 11.2*  9.7*  ?HCT 32.5*   < > 26.1* 24.3*   < > 25.4* 33.0* 28.9*  ?MCV 95.3  --  97.4 98.8  --  95.1  --  91.7  ?PLT 172  --  154 138*  --  145*  --  163  ? < > = values in this interval not displayed.  ? ?CBG: ?Recent Labs  ?Lab 11/21/21 ?1136 11/21/21 ?1604 11/21/21 ?2029 11/22/21 ?0031 11/22/21 ?0414  ?GLUCAP 324* 242* 195* 196* 167*  ? ?Studies/Results: ?DG Wrist 2 Views Left ? ?Result Date: 11/20/2021 ?CLINICAL DATA:  Left wrist, right knee and left ankle post ORIF films. EXAM: LEFT WRIST - 2 VIEW; LEFT ANKLE COMPLETE - 3+ VIEW; PORTABLE RIGHT KNEE - 1-2 VIEW COMPARISON:  Preoperative x-rays of the left wrist, right knee and left ankle dated 11/17/2021. FINDINGS: Left wrist: AP, lateral and oblique views in a cast are provided. There has been ventral ORIF plate fixation based against the ventral  distal radial shaft and metaphysis, with multiple short securing screws. Post ORIF fracture alignment approximates anatomic. No new fracture has become apparent. A slightly laterally displaced ulnar styloid fracture is again shown and unchanged. There are extensive vascular calcifications in the forearm, wrist and hand, including with calcifications extending into the digital arteries. Generalized soft tissue swelling appears similar. Right knee: Routine four views are provided. There is an air-fluid level in the suprapatellar bursal space. There is scattered linear subcutaneous soft tissue gas laterally and a lateral sideplate ORIF with threaded securing screws against the lateral aspect of the proximal tibial metaphysis and diametaphysis. Post ORIF fracture alignment approximates anatomic. Joint spaces are maintained. No new fracture has become apparent. There are extensive vascular calcifications. Left ankle: AP, lateral and oblique views are provided in plaster casting. Two threaded screws enter obliquely from anteriorly into the distal tibial metaphysis, with near anatomic post fracture fixation alignment but with again  noted mild widening of the lateral mortise and mild narrowing of the medial mortise. Lateral malleolar fracture with mild lateral displacement and angulation continues to be seen and did not undergo ORIF. No new fracture has become apparent. Generalized soft tissue swelling appears similar. Extensive vascular calcifications. IMPRESSION: Postoperative changes of left wrist, right knee and left ankle as above. No acute hardware complication is seen. Lateral malleolar fracture did not undergo ORIF. Extensive vascular calcifications. Electronically Signed   By: Telford Nab M.D.   On: 11/20/2021 20:54  ? ?DG Wrist Complete Left ? ?Result Date: 11/20/2021 ?CLINICAL DATA:  ORIF distal radial fracture EXAM: LEFT WRIST - COMPLETE 3+ VIEW COMPARISON:  11/17/2021 FINDINGS: Three fluoroscopic images are obtained during the performance of the procedure and are provided for interpretation only. Images demonstrate plate and screw fixation of a distal left radial fracture. Fluoroscopy time: 2 minutes 42 seconds 1.98 mGy Please note that fluoroscopy time and dose are for the combined radius, tibial, and ankle fluoroscopy. IMPRESSION: Expected intraoperative appearance distal left radial fracture fixation. Electronically Signed   By: Merilyn Baba M.D.   On: 11/20/2021 18:04  ? ?DG Ankle Complete Left ? ?Result Date: 11/20/2021 ?CLINICAL DATA:  Left wrist, right knee and left ankle post ORIF films. EXAM: LEFT WRIST - 2 VIEW; LEFT ANKLE COMPLETE - 3+ VIEW; PORTABLE RIGHT KNEE - 1-2 VIEW COMPARISON:  Preoperative x-rays of the left wrist, right knee and left ankle dated 11/17/2021. FINDINGS: Left wrist: AP, lateral and oblique views in a cast are provided. There has been ventral ORIF plate fixation based against the ventral distal radial shaft and metaphysis, with multiple short securing screws. Post ORIF fracture alignment approximates anatomic. No new fracture has become apparent. A slightly laterally displaced ulnar styloid  fracture is again shown and unchanged. There are extensive vascular calcifications in the forearm, wrist and hand, including with calcifications extending into the digital arteries. Generalized soft tissue swelling appears similar. Right knee: Routine four views are provided. There is an air-fluid level in the suprapatellar bursal space. There is scattered linear subcutaneous soft tissue gas laterally and a lateral sideplate ORIF with threaded securing screws against the lateral aspect of the proximal tibial metaphysis and diametaphysis. Post ORIF fracture alignment approximates anatomic. Joint spaces are maintained. No new fracture has become apparent. There are extensive vascular calcifications. Left ankle: AP, lateral and oblique views are provided in plaster casting. Two threaded screws enter obliquely from anteriorly into the distal tibial metaphysis, with near anatomic post fracture fixation alignment but with again noted mild widening of  the lateral mortise and mild narrowing of the medial mortise. Lateral malleolar fracture with mild lateral displacement and angulation continues to be seen and did not undergo ORIF. No new fracture has become apparent. Generalized soft tissue swelling appears similar. Extensive vascular calcifications. IMPRESSION: Postoperative changes of left wrist, right knee and left ankle as above. No acute hardware complication is seen. Lateral malleolar fracture did not undergo ORIF. Extensive vascular calcifications. Electronically Signed   By: Telford Nab M.D.   On: 11/20/2021 20:54  ? ?DG Ankle Complete Left ? ?Result Date: 11/20/2021 ?CLINICAL DATA:  Fluoroscopy for open reduction internal fixation of left ankle fracture EXAM: LEFT ANKLE COMPLETE - 3+ VIEW COMPARISON:  CT left ankle 11/17/2021 FINDINGS: Images were performed intraoperatively without the presence of a radiologist. Patient is undergoing screw fixation of the distal tibial fracture. Distal fibular metaphyseal  fracture is again noted. Total fluoroscopy images: 8 Please see intraoperative findings for further detail. IMPRESSION: Fluoroscopy for ORIF of the distal tibia. Electronically Signed   By: Yvonne Kendall M.D.   On:

## 2021-11-23 DIAGNOSIS — E1159 Type 2 diabetes mellitus with other circulatory complications: Secondary | ICD-10-CM

## 2021-11-23 DIAGNOSIS — E1169 Type 2 diabetes mellitus with other specified complication: Secondary | ICD-10-CM

## 2021-11-23 DIAGNOSIS — I152 Hypertension secondary to endocrine disorders: Secondary | ICD-10-CM

## 2021-11-23 DIAGNOSIS — E785 Hyperlipidemia, unspecified: Secondary | ICD-10-CM

## 2021-11-23 LAB — GLUCOSE, CAPILLARY
Glucose-Capillary: 114 mg/dL — ABNORMAL HIGH (ref 70–99)
Glucose-Capillary: 124 mg/dL — ABNORMAL HIGH (ref 70–99)
Glucose-Capillary: 125 mg/dL — ABNORMAL HIGH (ref 70–99)
Glucose-Capillary: 136 mg/dL — ABNORMAL HIGH (ref 70–99)
Glucose-Capillary: 146 mg/dL — ABNORMAL HIGH (ref 70–99)
Glucose-Capillary: 180 mg/dL — ABNORMAL HIGH (ref 70–99)

## 2021-11-23 NOTE — Progress Notes (Signed)
?[facility] ?Progress Note  ? ?Subjective:   Patient seen and examined at bedside.   Family present and son acted as interpreter as no one picked up on remote interpreter line.  Clotted near the end of HD yesterday.  Minimal UF due to hypotension.  Looks like he may have possibly infiltrated yesterday when initially trying to stick him with bruising/swelling on under side of his access arm.  Patient denies pain in this area. Denies CP, COB, abdominal pain, and n/v/d.  Possibly going to CIR.  ? ?Objective ?Vitals:  ? 11/22/21 1549 11/22/21 2110 11/23/21 3762 11/23/21 0817  ?BP: (!) 152/69 124/69 (!) 131/53 (!) 128/55  ?Pulse: 76   69  ?Resp: '16 12 16 15  '$ ?Temp: 98.5 ?F (36.9 ?C) 98.2 ?F (36.8 ?C) 98.5 ?F (36.9 ?C) 98.6 ?F (37 ?C)  ?TempSrc: Oral Oral Oral Oral  ?SpO2: 100% 100% 93% 90%  ?Weight:      ?Height:      ? ?Physical Exam ?General:well appearing, injured male in NAD ?Heart:RRR, no mrg ?Lungs:CTAB, nml WOB ?Abdomen:soft, NTND ?Extremities:b/l LE in casts, LUE in cast and bruising swelling noted above cast on underside of arm ?Dialysis Access: LU AVF +b/t  ? ?Filed Weights  ? 11/20/21 1204 11/22/21 0730 11/22/21 1139  ?Weight: 63 kg 71 kg 69 kg  ? ? ?Intake/Output Summary (Last 24 hours) at 11/23/2021 1017 ?Last data filed at 11/22/2021 1139 ?Gross per 24 hour  ?Intake --  ?Output 500 ml  ?Net -500 ml  ? ? ?Additional Objective ?Labs: ?Basic Metabolic Panel: ?Recent Labs  ?Lab 11/18/21 ?0421 11/19/21 ?1250 11/20/21 ?8315 11/20/21 ?1418 11/22/21 ?0146  ?NA 134* 136 137 134* 132*  ?K 5.6* 5.3* 5.8* 3.9 4.2  ?CL 93* 95* 94* 96* 92*  ?CO2 '23 22 23  '$ --  23  ?GLUCOSE 174* 180* 145* 110* 193*  ?BUN 83* 71* 94* 26* 78*  ?CREATININE 11.33* 11.31* 13.24* 5.80* 9.76*  ?CALCIUM 8.6* 9.4 9.4  --  9.8  ?PHOS 6.3*  --  9.1*  --   --   ? ?Liver Function Tests: ?Recent Labs  ?Lab 11/17/21 ?1634 11/20/21 ?0843  ?AST 19  --   ?ALT 16  --   ?ALKPHOS 289*  --   ?BILITOT 0.7  --   ?PROT 7.3  --   ?ALBUMIN 3.6 3.1*   ? ?CBC: ?Recent Labs  ?Lab 11/17/21 ?1634 11/17/21 ?1707 11/18/21 ?0421 11/19/21 ?1250 11/20/21 ?1761 11/20/21 ?6073 11/20/21 ?1418 11/22/21 ?0146  ?WBC 12.2*  --  10.7* 10.1  --  9.3  --  9.7  ?NEUTROABS  --   --   --  7.3  --  6.8  --   --   ?HGB 10.7*   < > 8.4* 7.8*   < > 8.2* 11.2* 9.7*  ?HCT 32.5*   < > 26.1* 24.3*   < > 25.4* 33.0* 28.9*  ?MCV 95.3  --  97.4 98.8  --  95.1  --  91.7  ?PLT 172  --  154 138*  --  145*  --  163  ? < > = values in this interval not displayed.  ? ?CBG: ?Recent Labs  ?Lab 11/22/21 ?1551 11/22/21 ?2106 11/23/21 ?0016 11/23/21 ?7106 11/23/21 ?0833  ?GLUCAP 213* 128* 136* 124* 125*  ? ?Medications: ? methocarbamol (ROBAXIN) IV    ? sodium chloride    ? ? (feeding supplement) PROSource Plus  30 mL Oral BID BM  ? sodium chloride   Intravenous Once  ?  sodium chloride   Intravenous Once  ? amLODipine  10 mg Oral Daily  ? Chlorhexidine Gluconate Cloth  6 each Topical Q0600  ? Chlorhexidine Gluconate Cloth  6 each Topical Q0600  ? cholecalciferol  2,000 Units Oral Daily  ? cinacalcet  60 mg Oral Q supper  ? darbepoetin (ARANESP) injection - DIALYSIS  60 mcg Intravenous Q Thu-HD  ? docusate sodium  100 mg Oral BID  ? doxercalciferol  1 mcg Intravenous Q T,Th,Sa-HD  ? glipiZIDE  10 mg Oral QAC breakfast  ? heparin injection (subcutaneous)  5,000 Units Subcutaneous Q8H  ? insulin aspart  0-6 Units Subcutaneous Q4H  ? insulin aspart  2 Units Subcutaneous TID WC  ? lanthanum  2,000 mg Oral TID WC  ? metoprolol tartrate  12.5 mg Oral BID  ? rosuvastatin  20 mg Oral Daily  ? ? ?Dialysis Orders: ?TTS at [facility] ?3:45hr, 400/800, EDW 63.5kg, 2K/2Ca, LUE AVF, no heparin ?- No ESA/iron, last Hgb 11 ?- Hectoral 40mg IV q HD ?  ?Assessment/Plan: ?1. Fall from tree/multiple traumatic limb fractures: Ortho following, surgery 11/20/21, per notes: closed tx L elbow dislocation and ORIF to R tibial, L ankle, L wrist.   Nurse had to upwrap upper LUE dressing and cut portion of cast to expose fistula for  cannulation to facilitate dialysis.  L elbow/wrist remains stabilized. ?2. ESRD: Continue HD per usual TTS schedule - Next HD on 4/25.  Possible infiltration on Saturday with non painful bruising/swelling on under side of access arm, monitor.  ?3. HTN/volume: BP in goal. Home amlodipine/metoprolol restarted.  Does not appear volume overloaded, UF as tolerated.  ?4. Anemia of ESRD: Hgb 9.7 s/p 2 units pRBC.  Aranesp '60mg'$  qThurs. Follow labs.  ?5. Secondary hyperparathyroidism: Ca ok, Phos high - resume home binders (fosrenol) and sensipar. ?6. Nutrition: Continue supplement for wound healing.  Renal diet w/fluid restrictions.  ?7. T2DM ?8. HL  ?9. Dispo - possible CIR admit ? ?LJen Mow PA-C ?[facility] ?11/23/2021,10:17 AM ? LOS: 6 days  ? ? ?

## 2021-11-23 NOTE — Progress Notes (Signed)
OK per MD to leave PIV out ? ?

## 2021-11-23 NOTE — Plan of Care (Signed)

## 2021-11-23 NOTE — Progress Notes (Signed)
? ? ? [facility] ?                                                                            ? ? ?Mohit Zirbes, is a 51 y.o. male, DOB - 01/11/1971, TMH:[id number] ?Admit date - 11/17/2021    ?Outpatient Primary MD for the patient is [name], NP ? ?LOS - 6  days ? ? ? ?Brief summary  ? ?Dewitt Judice Acevedo-Ocejo is a Spanish-speaking 51 y.o. male with medical history significant for ESRD on TTS HD, T2DM with diabetic retinopathy, anemia of ESRD, HTN, HLD, PAD who is admitted with multiple skeletal fractures after fall from 20 foot height.  Orthopedics consulting for further management. ? ? ?Assessment & Plan  ? ? ?Assessment and Plan: ?Closed fracture of inferior pubic ramus, left,  ?Acute comminuted fracture of the left distal fibula and tibia with extension to the medial malleolus; possible left calcaneal fracture ?Acute comminuted fracture left distal radius ?Mildly displaced fracture of the base of proximal phalanx of right fifth toe ?Possible proximal right tibial fracture ?Posterior dislocation of the left elbow, s/p reduction in the ED ?Traumatic fall from 20 foot height ?Orthopedics consulted, s/p ORIF of the right tibial plateau,,ORIF of the left ankle , ORIF of the left distal radial fracture . ?He should be non weight bearing on the left upper extremity,  left lower extremity and right lower extremity for at least 6 weeks.  ?Pain control with tylenol, oxycodone  and dilaudid along with Robaxin.  ?Therapy eval recommending CIR.  ?Recommend outpatient follow up with orthopedics in 2 weeks.  ?Pt reports his pain is well controlled.  ? ?ESRD on hemodialysis TTS ([place]) ?Nephrology on board for HD.  ? ? ?Type 2 diabetes mellitus with complication, without long-term current use of insulin ([place]) ?CBG (last 3)  ?Recent Labs  ?  11/23/21 ?0343 11/23/21 ?9211 11/23/21 ?1225  ?GLUCAP 124* 125* 180*  ? ? ?Hemoglobin A1c is 7. Continue with SSI.  ?No change in meds.  ? ?Hypertension associated  with diabetes ([place]) ?BP elevated in setting of uncontrolled pain from multiple skeletal fractures.  Continue amlodipine, Lopressor, analgesics as needed. ? ?Anemia of chronic kidney failure ?Anemia of blood loss from the surgery.  ?S/p 2 units of prbc transfusion.  ?Hemoglobin around 9.7 post transfusion, will recheck labs in am.  ? ? ?Mild hyponatremia:  ?Probably from ESRD. Continue to monitor.  ? ? ?Hyperlipidemia associated with type 2 diabetes mellitus ([place]) ?Hold statin for now. ? ? ?Estimated body mass index is 24.55 kg/m? as calculated from the following: ?  Height as of this encounter: '5\' 6"'$  (1.676 m). ?  Weight as of this encounter: 69 kg. ? ?Code Status: full code.  ?DVT Prophylaxis:  heparin injection 5,000 Units Start: 11/20/21 2215 ?SCDs Start: 11/20/21 2108 ?SCDs Start: 11/17/21 2158 ? ? ?Level of Care: Level of care: Med-Surg ?Family Communication: none at bedside.  ? ?Disposition Plan:     Remains inpatient appropriate:  CIR  ? ?Procedures:  ?ORIF TO R tibial.  ? ?Consultants:   ?Orthopedics ?Nephrology.  ? ?Antimicrobials:  ? ?Anti-infectives (From admission, onward)  ? ? Start     Dose/Rate Route Frequency  Ordered Stop  ? 11/20/21 2200  ceFAZolin (ANCEF) IVPB 2g/100 mL premix  Status:  Discontinued       ? 2 g ?200 mL/hr over 30 Minutes Intravenous Every 8 hours 11/20/21 2107 11/20/21 2110  ? 11/20/21 1638  vancomycin (VANCOCIN) powder  Status:  Discontinued       ?   As needed 11/20/21 1639 11/20/21 1808  ? ?  ? ? ? ?Medications ? ?Scheduled Meds: ? (feeding supplement) PROSource Plus  30 mL Oral BID BM  ? sodium chloride   Intravenous Once  ? sodium chloride   Intravenous Once  ? amLODipine  10 mg Oral Daily  ? Chlorhexidine Gluconate Cloth  6 each Topical Q0600  ? Chlorhexidine Gluconate Cloth  6 each Topical Q0600  ? cholecalciferol  2,000 Units Oral Daily  ? cinacalcet  60 mg Oral Q supper  ? darbepoetin (ARANESP) injection - DIALYSIS  60 mcg Intravenous Q Thu-HD  ? docusate sodium  100 mg  Oral BID  ? doxercalciferol  1 mcg Intravenous Q T,Th,Sa-HD  ? glipiZIDE  10 mg Oral QAC breakfast  ? heparin injection (subcutaneous)  5,000 Units Subcutaneous Q8H  ? insulin aspart  0-6 Units Subcutaneous Q4H  ? insulin aspart  2 Units Subcutaneous TID WC  ? lanthanum  2,000 mg Oral TID WC  ? metoprolol tartrate  12.5 mg Oral BID  ? rosuvastatin  20 mg Oral Daily  ? ?Continuous Infusions: ? methocarbamol (ROBAXIN) IV    ? sodium chloride    ? ?PRN Meds:.diphenhydrAMINE, hydrALAZINE, HYDROmorphone (DILAUDID) injection, methocarbamol **OR** methocarbamol (ROBAXIN) IV, oxyCODONE, oxyCODONE, polyethylene glycol, senna-docusate, sodium chloride ? ? ? ?Subjective:  ? ?Brandyn Acevedo-Ocejo was seen and examined today.  Pain well controlled. Hope ful for CIR.  ? ?Objective:  ? ?Vitals:  ? 11/22/21 1549 11/22/21 2110 11/23/21 1025 11/23/21 0817  ?BP: (!) 152/69 124/69 (!) 131/53 (!) 128/55  ?Pulse: 76   69  ?Resp: '16 12 16 15  '$ ?Temp: 98.5 ?F (36.9 ?C) 98.2 ?F (36.8 ?C) 98.5 ?F (36.9 ?C) 98.6 ?F (37 ?C)  ?TempSrc: Oral Oral Oral Oral  ?SpO2: 100% 100% 93% 90%  ?Weight:      ?Height:      ? ?No intake or output data in the 24 hours ending 11/23/21 1413 ? ?Filed Weights  ? 11/20/21 1204 11/22/21 0730 11/22/21 1139  ?Weight: 63 kg 71 kg 69 kg  ? ? ? ?Exam ?General exam: Appears calm and comfortable  ?Respiratory system: Clear to auscultation. Respiratory effort normal. ?Cardiovascular system: S1 & S2 heard, RRR. No JVD, No pedal edema. ?Gastrointestinal system: Abdomen is nondistended, soft and nontender. Normal bowel sounds heard. ?Central nervous system: Alert and oriented. No focal neurological deficits. ?Extremities: RLE in a cast, painful ROM of the extremities.  ?Skin: No rashes, lesions or ulcers ?Psychiatry: Mood & affect appropriate.  ? ? ? ?Data Reviewed:  I have personally reviewed following labs and imaging studies ? ? ?CBC ?Lab Results  ?Component Value Date  ? WBC 9.7 11/22/2021  ? RBC 3.15 (L) 11/22/2021  ? HGB  9.7 (L) 11/22/2021  ? HCT 28.9 (L) 11/22/2021  ? MCV 91.7 11/22/2021  ? MCH 30.8 11/22/2021  ? PLT 163 11/22/2021  ? MCHC 33.6 11/22/2021  ? RDW 14.6 11/22/2021  ? LYMPHSABS 1.4 11/20/2021  ? MONOABS 1.0 11/20/2021  ? EOSABS 0.1 11/20/2021  ? BASOSABS 0.0 11/20/2021  ? ? ? ?Last metabolic panel ?Lab Results  ?Component Value Date  ? NA  132 (L) 11/22/2021  ? K 4.2 11/22/2021  ? CL 92 (L) 11/22/2021  ? CO2 23 11/22/2021  ? BUN 78 (H) 11/22/2021  ? CREATININE 9.76 (H) 11/22/2021  ? GLUCOSE 193 (H) 11/22/2021  ? GFRNONAA 6 (L) 11/22/2021  ? GFRAA 11 (L) 04/02/2020  ? CALCIUM 9.8 11/22/2021  ? PHOS 9.1 (H) 11/20/2021  ? PROT 7.3 11/17/2021  ? ALBUMIN 3.1 (L) 11/20/2021  ? LABGLOB 3.2 01/15/2021  ? AGRATIO 1.4 01/15/2021  ? BILITOT 0.7 11/17/2021  ? ALKPHOS 289 (H) 11/17/2021  ? AST 19 11/17/2021  ? ALT 16 11/17/2021  ? ANIONGAP 17 (H) 11/22/2021  ? ? ?CBG (last 3)  ?Recent Labs  ?  11/23/21 ?0343 11/23/21 ?2409 11/23/21 ?1225  ?GLUCAP 124* 125* 180*  ? ?  ? ? ?Coagulation Profile: ?Recent Labs  ?Lab 11/17/21 ?1634  ?INR 1.1  ? ? ? ? ?Radiology Studies: ?No results found. ? ? ? ? ?Hosie Poisson M.D. ?[facility] ?11/23/2021, 2:13 PM ? ?Available via Epic secure chat 7am-7pm ?After 7 pm, please refer to night coverage provider listed on amion. ? ? ? ?

## 2021-11-24 ENCOUNTER — Encounter (HOSPITAL_COMMUNITY): Payer: Self-pay

## 2021-11-24 LAB — CBC WITH DIFFERENTIAL/PLATELET
Abs Immature Granulocytes: 0.06 10*3/uL (ref 0.00–0.07)
Basophils Absolute: 0 10*3/uL (ref 0.0–0.1)
Basophils Relative: 0 %
Eosinophils Absolute: 0.9 10*3/uL — ABNORMAL HIGH (ref 0.0–0.5)
Eosinophils Relative: 9 %
HCT: 30.6 % — ABNORMAL LOW (ref 39.0–52.0)
Hemoglobin: 10 g/dL — ABNORMAL LOW (ref 13.0–17.0)
Immature Granulocytes: 1 %
Lymphocytes Relative: 14 %
Lymphs Abs: 1.4 10*3/uL (ref 0.7–4.0)
MCH: 31.1 pg (ref 26.0–34.0)
MCHC: 32.7 g/dL (ref 30.0–36.0)
MCV: 95 fL (ref 80.0–100.0)
Monocytes Absolute: 0.7 10*3/uL (ref 0.1–1.0)
Monocytes Relative: 8 %
Neutro Abs: 6.7 10*3/uL (ref 1.7–7.7)
Neutrophils Relative %: 68 %
Platelets: 251 10*3/uL (ref 150–400)
RBC: 3.22 MIL/uL — ABNORMAL LOW (ref 4.22–5.81)
RDW: 14.5 % (ref 11.5–15.5)
WBC: 9.7 10*3/uL (ref 4.0–10.5)
nRBC: 0 % (ref 0.0–0.2)

## 2021-11-24 LAB — RENAL FUNCTION PANEL
Albumin: 2.7 g/dL — ABNORMAL LOW (ref 3.5–5.0)
Anion gap: 17 — ABNORMAL HIGH (ref 5–15)
BUN: 91 mg/dL — ABNORMAL HIGH (ref 6–20)
CO2: 26 mmol/L (ref 22–32)
Calcium: 9.4 mg/dL (ref 8.9–10.3)
Chloride: 88 mmol/L — ABNORMAL LOW (ref 98–111)
Creatinine, Ser: 10.47 mg/dL — ABNORMAL HIGH (ref 0.61–1.24)
GFR, Estimated: 5 mL/min — ABNORMAL LOW (ref >60)
Glucose, Bld: 111 mg/dL — ABNORMAL HIGH (ref 70–99)
Phosphorus: 6.5 mg/dL — ABNORMAL HIGH (ref 2.5–4.6)
Potassium: 4.9 mmol/L (ref 3.5–5.1)
Sodium: 131 mmol/L — ABNORMAL LOW (ref 135–145)

## 2021-11-24 LAB — GLUCOSE, CAPILLARY
Glucose-Capillary: 102 mg/dL — ABNORMAL HIGH (ref 70–99)
Glucose-Capillary: 104 mg/dL — ABNORMAL HIGH (ref 70–99)
Glucose-Capillary: 106 mg/dL — ABNORMAL HIGH (ref 70–99)
Glucose-Capillary: 123 mg/dL — ABNORMAL HIGH (ref 70–99)
Glucose-Capillary: 141 mg/dL — ABNORMAL HIGH (ref 70–99)
Glucose-Capillary: 145 mg/dL — ABNORMAL HIGH (ref 70–99)
Glucose-Capillary: 98 mg/dL (ref 70–99)

## 2021-11-24 MED ORDER — SODIUM CHLORIDE 0.9 % IV SOLN: 100.0000 mL | INTRAVENOUS | Status: DC | PRN

## 2021-11-24 MED ORDER — ORAL CARE MOUTH RINSE
15.0000 mL | Freq: Two times a day (BID) | OROMUCOSAL | Status: DC
  Administered 2021-11-25 – 2021-11-27 (×4): 15 mL via OROMUCOSAL

## 2021-11-24 MED ORDER — CHLORHEXIDINE GLUCONATE CLOTH 2 % EX PADS
6.0000 | MEDICATED_PAD | Freq: Every day | CUTANEOUS | Status: DC
  Administered 2021-11-25 – 2021-11-27 (×3): 6 via TOPICAL

## 2021-11-24 MED ORDER — ALTEPLASE 2 MG IJ SOLR
2.0000 mg | Freq: Once | INTRAMUSCULAR | Status: DC | PRN
  Filled 2021-11-24: qty 2

## 2021-11-24 MED ORDER — LIDOCAINE HCL (PF) 1 % IJ SOLN
5.0000 mL | INTRAMUSCULAR | Status: DC | PRN
  Filled 2021-11-24: qty 5

## 2021-11-24 MED ORDER — PENTAFLUOROPROP-TETRAFLUOROETH EX AERO: 1.0000 "application " | INHALATION_SPRAY | CUTANEOUS | Status: DC | PRN

## 2021-11-24 MED ORDER — HEPARIN SODIUM (PORCINE) 1000 UNIT/ML DIALYSIS
1000.0000 [IU] | INTRAMUSCULAR | Status: DC | PRN
  Filled 2021-11-24: qty 1

## 2021-11-24 MED ORDER — LIDOCAINE-PRILOCAINE 2.5-2.5 % EX CREA
1.0000 "application " | TOPICAL_CREAM | CUTANEOUS | Status: DC | PRN
  Filled 2021-11-24: qty 5

## 2021-11-24 NOTE — Plan of Care (Incomplete)
Oral care bundle initiated per protocol by this RN (not previously implemented).  ?

## 2021-11-24 NOTE — Progress Notes (Signed)
Occupational Therapy Treatment ?Patient Details ?Name: Blake Santana ?MRN: [id number] ?DOB: 1971/05/07 ?Today's Date: 11/24/2021 ? ? ?History of present illness Pt is a 51 y.o. male admitted 11/17/21 as level 2 trauma after ~20 ft fall from ladder while cutting tree branches. Pt sustained L elbow dislocation s/p reduction, L pilon fx, R tibial plateau fx, R 5th toe fx, pubic ramus fx. S/p L pilon ORIF, R tibial ORIF, L distal radius ORIF, closed tx L elbow dislocation. PMH includes HTN, CHF, ESRD (HD TTS), DM, cataracts. ?  ?OT comments ? Pt making slow progress towards goals, primarily limited by NWB in 3/4 extremities. Completes seated grooming task with set up A in chair, completes A > P transfer to chair with max A +2, pt needing BLE's elevated throughout due to pain. Educated pt on continued elevation and ROM of L shoulder/digit movement for edema control, pt verbalized understanding, states he has been doing this. LUE positioned on pillows at end of session. Pt presenting with impairments listed below, will follow acutely. Updating d/c recommendation to [facility] as  ? ?Recommendations for follow up therapy are one component of a multi-disciplinary discharge planning process, led by the attending physician.  Recommendations may be updated based on patient status, additional functional criteria and insurance authorization. ?   ?Follow Up Recommendations ? Home health OT  ?  ?Assistance Recommended at Discharge Frequent or constant Supervision/Assistance  ?Patient can return home with the following ? Two people to help with walking and/or transfers;Two people to help with bathing/dressing/bathroom;Assist for transportation;Help with stairs or ramp for entrance;Assistance with cooking/housework ?  ?Equipment Recommendations ? BSC/3in1;Wheelchair (measurements OT);Wheelchair cushion (measurements OT);Hospital bed;Other (comment) (hoyer lift)  ?  ?Recommendations for Other Services   ? ?  ?Precautions /  Restrictions Precautions ?Precautions: Fall ?Required Braces or Orthoses: Splint/Cast;Sling ?Splint/Cast: LUE sling, L ankle cast ?Restrictions ?Weight Bearing Restrictions: Yes ?LUE Weight Bearing: Non weight bearing ?RLE Weight Bearing: Non weight bearing ?LLE Weight Bearing: Non weight bearing ?Other Position/Activity Restrictions: Ortho PA verbal clarification - unrestricted AROM L shoulder, L knee/hip, R ankle/knee/hip; pt NOT to WB through L elbow  ? ? ?  ? ?Mobility Bed Mobility ?Overal bed mobility: Needs Assistance ?Bed Mobility: Supine to Sit ?  ?  ?Supine to sit: Max assist, +2 for physical assistance, HOB elevated ?Sit to supine: Max assist, +2 for physical assistance ?  ?General bed mobility comments: Able to assist with RUE on bed rail, maxA+2 for BLE management and trunk elevation ?  ? ?Transfers ?Overall transfer level: Needs assistance ?  ?Transfers: Bed to chair/wheelchair/BSC ?  ?  ?  ?  ?Anterior-Posterior transfers: Max assist, +2 physical assistance ?  ?General transfer comment: maxA+2 for BLE management and scooting hips with bed pad for anterior-posterior transfer from bed to recliner ?  ?  ?Balance Overall balance assessment: Needs assistance ?Sitting-balance support: No upper extremity supported, Feet unsupported ?Sitting balance-Leahy Scale: Fair ?Sitting balance - Comments: requesting therapist hold LEs with knees extended due to pain ?  ?  ?  ?  ?  ?  ?  ?  ?  ?  ?  ?  ?  ?  ?  ?   ? ?ADL either performed or assessed with clinical judgement  ? ?ADL Overall ADL's : Needs assistance/impaired ?  ?  ?Grooming: Set up;Wash/dry hands;Wash/dry face ?Grooming Details (indicate cue type and reason): washes face up in chair ?  ?  ?  ?  ?  ?  ?  ?  ?  Toilet Transfer: Anterior/posterior;Maximal assistance;+2 for physical assistance ?Toilet Transfer Details (indicate cue type and reason): simulated to chair ?  ?  ?  ?  ?Functional mobility during ADLs: Maximal assistance;+2 for physical assistance ?   ?  ? ?Extremity/Trunk Assessment Upper Extremity Assessment ?Upper Extremity Assessment: RUE deficits/detail ?RUE Deficits / Details: s/p elbow dislocation reduction; functionally >4/5 strength throughout, denies pain with ROM ?LUE Deficits / Details: s/p L distal radius ORIF in cast; able to partially flex/ext fingers, noted swelling; L shoulder with good AROM although painful, gross function at least 3/5 ?  ?Lower Extremity Assessment ?Lower Extremity Assessment: Defer to PT evaluation ?  ?  ?  ? ?Vision   ?Additional Comments: hx of diabetic retinopathy ?  ?Perception Perception ?Perception: Not tested ?  ?Praxis Praxis ?Praxis: Not tested ?  ? ?Cognition Arousal/Alertness: Awake/alert ?Behavior During Therapy: [facility] for tasks assessed/performed ?Overall Cognitive Status: Within Functional Limits for tasks assessed ?  ?  ?  ?  ?  ?  ?  ?  ?  ?  ?  ?  ?  ?  ?  ?  ?General Comments: via Spanish interpreter Graceila ?  ?  ?   ?Exercises   ? ?  ?Shoulder Instructions   ? ? ?  ?General Comments SpO2 dropping to high 80's on RA during transfer, returned to mid 90's post session up in chair. Educated pt on continuing LUE shoulder ROM as well as moving digits for edema control, LUE elevated at end of session  ? ? ?Pertinent Vitals/ Pain       Pain Assessment ?Pain Assessment: Faces ?Pain Score: 8  ?Pain Location: R hip area ?Pain Descriptors / Indicators: Discomfort, Grimacing, Guarding, Sore ?Pain Intervention(s): Monitored during session, Limited activity within patient's tolerance, Repositioned ? ?Home Living   ?  ?  ?  ?  ?  ?  ?  ?  ?  ?  ?  ?  ?  ?  ?  ?  ?  ?  ? ?  ?Prior Functioning/Environment    ?  ?  ?  ?   ? ?Frequency ? Min 3X/week  ? ? ? ? ?  ?Progress Toward Goals ? ?OT Goals(current goals can now be found in the care plan section) ? Progress towards OT goals: Progressing toward goals ? ?Acute Rehab OT Goals ?Patient Stated Goal: to go home ?OT Goal Formulation: With patient/family ?Time For Goal  Achievement: 12/04/21 ?Potential to Achieve Goals: Good ?ADL Goals ?Pt Will Perform Lower Body Bathing: with caregiver independent in assisting;with min assist;bed level ?Pt Will Perform Lower Body Dressing: bed level;with caregiver independent in assisting;with min assist ?Pt Will Transfer to Toilet: bedside commode;anterior/posterior transfer;with +2 assist;with mod assist ?Additional ADL Goal #1: Bed mobility with mod A in preparation for ADL tasks  ?Plan Discharge plan needs to be updated;Frequency needs to be updated   ? ?Co-evaluation ? ? ? PT/OT/SLP Co-Evaluation/Treatment: Yes ?Reason for Co-Treatment: Complexity of the patient's impairments (multi-system involvement);Necessary to address cognition/behavior during functional activity;To address functional/ADL transfers ?  ?OT goals addressed during session: ADL's and self-care ?  ? ?  ?AM-PAC OT "6 Clicks" Daily Activity     ?Outcome Measure ? ? Help from another person eating meals?: A Little ?Help from another person taking care of personal grooming?: A Little ?Help from another person toileting, which includes using toliet, bedpan, or urinal?: A Lot ?Help from another person bathing (including washing, rinsing, drying)?: A Lot ?Help from another  person to put on and taking off regular upper body clothing?: A Lot ?Help from another person to put on and taking off regular lower body clothing?: A Lot ?6 Click Score: 14 ? ?  ?End of Session   ? ?OT Visit Diagnosis: Other abnormalities of gait and mobility (R26.89);Muscle weakness (generalized) (M62.81);Pain ?  ?Activity Tolerance Patient tolerated treatment well ?  ?Patient Left in chair;with call bell/phone within reach;with chair alarm set ?  ?Nurse Communication Mobility status;Precautions;Weight bearing status ?  ? ?   ? ?Time: 3496-1164 ?OT Time Calculation (min): 32 min ? ?Charges: OT General Charges ?$OT Visit: 1 Visit ?OT Treatments ?$Self Care/Home Management : 8-22 mins ? ?Lynnda Child, OTD,  OTR/L ?Acute Rehab ?[phone] ? ? ?Kaylyn Lim ?11/24/2021, 1:41 PM ?

## 2021-11-24 NOTE — TOC Initial Note (Addendum)
Transition of Care (TOC) - Initial/Assessment Note  ? ? ?Patient Details  ?Name: Blake Santana ?MRN: [id number] ?Date of Birth: 1970-08-27 ? ?Transition of Care (TOC) CM/SW Contact:    ?Bartholomew Crews, RN ?Phone Number: [phone] ?11/24/2021, 3:23 PM ? ?Clinical Narrative:                 ? ?Spoke with patient at the bedside using video interpreter #[id number] to discuss post acute transition. Verified no insurance and no social security number. HD paid for through emergency Medicaid. Lives with wife and son who assist with dialysis transportation. Consent received to speak with his wife - attempted contact using [place] [phone] but phone not in service - left message for sister and number listed for patient has full voicemail.  ? ?Patient not able to fully participate in therapies at this time d/t nonweightbearing status, therefore, not appropriate for CIR at this time.  ? ?Recommended DME includes wheelchair, hoyer lift, and BSC. Private pay cost is [phone].   ? ?Wheelchair transportation will need to be arranged for dialysis transportation.  ? ?Patient [facility].  ? ?TOC following for transition needs.  ? ?UPDATE 1730: [employer] to provide needed DME under charity program.  ? ?Spoke with patient at the bedside using video interpreter Allena Katz [phone] to complete application for dialysis transportation. Patient stated that he has a ramp.  ? ?Confirmed home address and Regino Schultze is wife and phone number is correct.  ? ?Application for [facility] emailed for dialysis transportation, approval pending. Patient will need ambulance transportation home.  ? ?Expected Discharge Plan: [place] ?Barriers to Discharge: Continued Medical Work up ? ? ?Patient Goals and CMS Choice ?Patient states their goals for this hospitalization and ongoing recovery are:: return home with wife and son ?CMS Medicare.gov Compare Post Acute Care list provided to:: Patient ?Choice offered to /  list presented to : Patient ? ?Expected Discharge Plan and Services ?Expected Discharge Plan: [place] ?In-house Referral: Clinical Social Work, Counselling psychologist ?Discharge Planning Services: CM Consult ?Post Acute Care Choice: Home Health, Durable Medical Equipment ?Living arrangements for the past 2 months: [place] ?                ?DME Arranged: 3-N-1, Hospital bed, Wheelchair manual ?DME Agency: [employer] ?Date DME Agency Contacted: 11/24/21 ?Time DME Agency Contacted: [phone] ?Representative spoke with at DME Agency: Delana Meyer ?HH Arranged: PT, OT ?  ?  ?  ?  ? ?Prior Living Arrangements/Services ?Living arrangements for the past 2 months: [place] ?Lives with:: Self, Adult Children, Spouse ?  ?Do you feel safe going back to the place where you live?: Yes      ?Need for Family Participation in Patient Care: Yes (Comment) ?  ?  ?Criminal Activity/Legal Involvement Pertinent to Current Situation/Hospitalization: No - Comment as needed ? ?Activities of Daily Living ?Home Assistive Devices/Equipment: None (uses wife's blood sugar meter to do accuchecks, checks infrequently) ?ADL Screening (condition at time of admission) ?Patient's cognitive ability adequate to safely complete daily activities?: Yes ?Is the patient deaf or have difficulty hearing?: No ?Does the patient have difficulty seeing, even when wearing glasses/contacts?: No ?Does the patient have difficulty concentrating, remembering, or making decisions?: No ?Patient able to express need for assistance with ADLs?: Yes ?Does the patient have difficulty dressing or bathing?: No ?Independently performs ADLs?: Yes (appropriate for developmental age) (at baseline, before accident) ?Communication: Independent ?Dressing (OT): Independent ?Grooming:  Independent ?Feeding: Independent ?Bathing: Independent ?Toileting: Independent ?In/Out Bed: Independent ?Walks in Home: Independent ?Does the patient have difficulty walking  or climbing stairs?: No (at baseline, before accident) ?Weakness of Legs: None ?Weakness of Arms/Hands: None ? ?Permission Sought/Granted ?Permission sought to share information with : Family Supports ?  ?   ?   ?   ?   ? ?Emotional Assessment ?Appearance:: Appears stated age ?Attitude/Demeanor/Rapport: Engaged ?Affect (typically observed): Accepting ?Orientation: : Oriented to Self, Oriented to Place, Oriented to  Time, Oriented to Situation ?Alcohol / Substance Use: Not Applicable ?Psych Involvement: No (comment) ? ?Admission diagnosis:  Fall, initial encounter [W19.XXXA] ?Closed fracture of left ankle, initial encounter [S82.892A] ?Closed displaced fracture of proximal phalanx of lesser toe of right foot, initial encounter [S92.511A] ?Traumatic closed fracture of ulnar styloid with minimal displacement, left, initial encounter [S52.612A] ?Dislocation of left elbow, initial encounter [S53.105A] ?Closed fracture of right tibial plateau, initial encounter [S82.141A] ?Closed fracture of inferior pubic ramus, left, initial encounter ([place]) [S32.592A] ?Closed fracture of distal end of left radius, unspecified fracture morphology, initial encounter [S52.502A] ?Patient Active Problem List  ? Diagnosis Date Noted  ? Closed fracture of inferior pubic ramus, left, initial encounter ([place]) 11/17/2021  ? Fracture of distal end of tibia with fibula, left, closed, initial encounter 11/17/2021  ? Distal radius fracture, left 11/17/2021  ? Posterior dislocation of left elbow 11/17/2021  ? Injury resulting from fall from height 11/17/2021  ? Closed fracture of proximal end of right tibia 11/17/2021  ? Disp fx of proximal phalanx of right lesser toe(s), init 11/17/2021  ? Hyperlipidemia associated with type 2 diabetes mellitus ([place]) 11/17/2021  ? Pain of joint of left ankle and foot 06/10/2021  ? Pneumonia due to COVID-19 virus 03/30/2020  ? Acute respiratory failure due to COVID-19 [facility]) 03/30/2020  ? Sepsis ([place]) 03/30/2020  ?  Noncompliance 02/24/2020  ? TB (tuberculosis) contact 11/27/2018  ? COVID-19 virus detected   ? Hypoxia   ? Abnormal EKG   ? Volume overload 09/02/2018  ? Pleural effusion, bilateral 09/02/2018  ? Atypical chest pain 09/01/2018  ? Acute respiratory failure with hypoxia ([place]) 09/01/2018  ? Community acquired pneumonia   ? Elevated troponin   ? Malnutrition of moderate degree 08/11/2017  ? RSV (respiratory syncytial virus infection) 08/09/2017  ? ESRD on hemodialysis TTS ([place]) 08/08/2017  ? Proliferative diabetic retinopathy with macular edema ([place]) 08/03/2017  ? Hypertensive retinopathy of both eyes 08/03/2017  ? Cataracts, bilateral 08/03/2017  ? Acute exacerbation of CHF (congestive heart failure) ([place]) 04/03/2017  ? Anemia of chronic kidney failure 03/11/2017  ? Hypertension associated with diabetes ([place]) 03/11/2017  ? Chronic kidney disease 03/11/2017  ? Edema 01/14/2017  ? Dyslipidemia 10/15/2016  ? Diabetic retinopathy ([place]) 10/15/2016  ? Microalbuminuria 06/18/2016  ? Normocytic anemia 06/18/2016  ? Iron deficiency anemia 06/18/2016  ? Type 2 diabetes mellitus with complication, without long-term current use of insulin ([place]) 10/20/2001  ? ?PCP:  [name], NP ?Pharmacy:   ?[facility] ?[address] ?[place] [address] ?Phone: [phone] Fax: [phone] ? ? ? ? ?Social Determinants of Health (SDOH) Interventions ?  ? ?Readmission Risk Interventions ?   ? View : No data to display.  ?  ?  ?  ? ? ? ?

## 2021-11-24 NOTE — Progress Notes (Signed)
Inpatient Rehab Admissions Coordinator:  ?Note PT/OT are now recommending [place] therapy. TOC made aware. ? ? ?Gayland Curry, MS, CCC-SLP ?Admissions Coordinator ?[phone] ? ?

## 2021-11-24 NOTE — Progress Notes (Addendum)
Physical Therapy Treatment ?Patient Details ?Name: Blake Santana ?MRN: [id number] ?DOB: [birth year] ?Today's Date: 11/24/2021 ? ? ?History of Present Illness Pt is a 51 y.o. male admitted 11/17/21 as level 2 trauma after ~20 ft fall from ladder while cutting tree branches. Pt sustained L elbow dislocation s/p reduction, L pilon fx, R tibial plateau fx, R 5th toe fx, pubic ramus fx. S/p L pilon ORIF, R tibial ORIF, L distal radius ORIF, closed tx L elbow dislocation. PMH includes HTN, CHF, ESRD (HD TTS), DM, cataracts. ?  ?PT Comments  ? ? Pt progressing with mobility. Today's session focused on OOB transfer training; pt requiring maxA+2 for anterior-posterior transfer to recliner secondary to pain and 3/4 extremities with NWB restrictions. Pt will require maximove for OOB transfers with nursing staff. Reviewed educ re: precautions, positioning, therex, activity recommendations. Increased time discussing d/c plan, as pt's activity progression limited by NWB status -- pt reports understanding that he will need w/c and hoyer lift to mobilize at home with assist from family. Pt reports DME will fit into home and family able to provide necessary assist. Will continue to follow acutely to address establish goals. ? ?SpO2 briefly down to 88% on RA; maintaining 94-96% on RA at end of session ? ?Session performed via in-person Spanish interpreter  ?   ?Recommendations for follow up therapy are one component of a multi-disciplinary discharge planning process, led by the attending physician.  Recommendations may be updated based on patient status, additional functional criteria and insurance authorization. ? ?Follow Up Recommendations ? Home health PT ?  ?  ?Assistance Recommended at Discharge Intermittent Supervision/Assistance  ?Patient can return home with the following A lot of help with walking and/or transfers;A lot of help with bathing/dressing/bathroom;Assistance with cooking/housework;Assist for  transportation;Help with stairs or ramp for entrance ?  ?Equipment Recommendations ? BSC/3in1;Wheelchair;Wheelchair cushion;Hospital bed;Hoyer lift ?  ?Recommendations for Other Services   ? ? ?  ?Precautions / Restrictions Precautions ?Precautions: Fall ?Required Braces or Orthoses: Splint/Cast;Sling ?Splint/Cast: LUE sling, L ankle cast ?Restrictions ?Weight Bearing Restrictions: Yes ?LUE Weight Bearing: Non weight bearing ?RLE Weight Bearing: Non weight bearing ?LLE Weight Bearing: Non weight bearing ?Other Position/Activity Restrictions: Ortho PA verbal clarification - unrestricted AROM L shoulder, L knee/hip, R ankle/knee/hip; pt NOT to WB through L elbow  ?  ? ?Mobility ? Bed Mobility ?Overal bed mobility: Needs Assistance ?Bed Mobility: Supine to Sit ?  ?  ?Supine to sit: Max assist, +2 for physical assistance, HOB elevated ?  ?  ?General bed mobility comments: Able to assist with RUE on bed rail, maxA+2 for BLE management and trunk elevation ?  ? ?Transfers ?Overall transfer level: Needs assistance ?  ?Transfers: Bed to chair/wheelchair/BSC ?  ?  ?  ?  ?Anterior-Posterior transfers: Max assist, +2 physical assistance ?  ?General transfer comment: maxA+2 for BLE management and scooting hips with bed pad for anterior-posterior transfer from bed to recliner ?  ? ?Ambulation/Gait ?  ?  ?  ?  ?  ?  ?  ?  ? ? ?Stairs ?  ?  ?  ?  ?  ? ? ?Wheelchair Mobility ?  ? ?Modified Rankin (Stroke Patients Only) ?  ? ? ?  ?Balance Overall balance assessment: Needs assistance ?Sitting-balance support: No upper extremity supported, Feet unsupported ?Sitting balance-Leahy Scale: Fair ?Sitting balance - Comments: requesting therapist hold LEs with knees extended due to pain ?  ?  ?  ?  ?  ?  ?  ?  ?  ?  ?  ?  ?  ?  ?  ?  ? ?  ?  Cognition Arousal/Alertness: Awake/alert ?Behavior During Therapy: [facility] for tasks assessed/performed ?Overall Cognitive Status: Within Functional Limits for tasks assessed ?  ?  ?  ?  ?  ?  ?  ?  ?  ?  ?  ?   ?  ?  ?  ?  ?General Comments: via Spanish interpreter ?  ?  ? ?  ?Exercises Other Exercises ?Other Exercises: R calf stretch w/ strap, R heel slide w/ strap (partial range, painful), improving ability to perform R ankle pumps; L hip/knee flex (knee to chest) and partial SLR ? ?  ?General Comments General comments (skin integrity, edema, etc.): Session performed via in-person Spanish interpreter Spero Geralds). SpO2 down to 88% on RA briefly, maintaining 94-96% on RA at rest with reliable pleth. Increased time discussing d/c planning - as pt with limited ability to progress transfers secondary to 3/4 extremities NWB - educ on recommendation for return home with w/c and hoyer lift, pt endorses understanding. ?  ?  ? ?Pertinent Vitals/Pain Pain Assessment ?Pain Assessment: 0-10 ?Pain Score: 8  ?Pain Location: R hip area ?Pain Descriptors / Indicators: Discomfort, Grimacing, Guarding, Sore ?Pain Intervention(s): Monitored during session, Limited activity within patient's tolerance, RN gave pain meds during session, Repositioned  ? ? ?Home Living   ?  ?  ?  ?  ?  ?  ?  ?  ?  ?   ?  ?Prior Function    ?  ?  ?   ? ?PT Goals (current goals can now be found in the care plan section) Progress towards PT goals: Progressing toward goals ? ?  ?Frequency ? ? ? Min 5X/week ? ? ? ?  ?PT Plan Current plan remains appropriate  ? ? ?Co-evaluation   ?  ?  ?  ?  ? ?  ?AM-PAC PT "6 Clicks" Mobility   ?Outcome Measure ? Help needed turning from your back to your side while in a flat bed without using bedrails?: Total ?Help needed moving from lying on your back to sitting on the side of a flat bed without using bedrails?: Total ?Help needed moving to and from a bed to a chair (including a wheelchair)?: Total ?Help needed standing up from a chair using your arms (e.g., wheelchair or bedside chair)?: Total ?Help needed to walk in hospital room?: Total ?Help needed climbing 3-5 steps with a railing? : Total ?6 Click Score: 6 ? ?  ?End of  Session   ?Activity Tolerance: Patient tolerated treatment well;Patient limited by pain ?Patient left: in chair;with call bell/phone within reach;with chair alarm set ?Nurse Communication: Mobility status;Need for lift equipment (return to bed with maximove) ?PT Visit Diagnosis: Other abnormalities of gait and mobility (R26.89);Pain ?  ? ? ?Time: 6226-3335 ?PT Time Calculation (min) (ACUTE ONLY): 32 min ? ?Charges:  $Therapeutic Activity: 8-22 mins          ?          ? ?Mabeline Caras, PT, DPT ?Acute Rehabilitation Services  ?Pager [phone] ?Office [phone] ? ?Derry Lory ?11/24/2021, 1:05 PM ? ?

## 2021-11-24 NOTE — Progress Notes (Signed)
? ? ? [facility] ?                                                                            ? ? ?Blake Santana, is a 51 y.o. male, DOB - 1970/11/29, HOZ:[id number] ?Admit date - 11/17/2021    ?Outpatient Primary MD for the patient is [name], NP ? ?LOS - 7  days ? ? ? ?Brief summary  ? ?Blake Santana is a Spanish-speaking 51 y.o. male with medical history significant for ESRD on TTS HD, T2DM with diabetic retinopathy, anemia of ESRD, HTN, HLD, PAD who is admitted with multiple skeletal fractures after fall from 20 foot height.  Orthopedics consulting for further management. ? ? ?Assessment & Plan  ? ? ?Assessment and Plan: ?Closed fracture of inferior pubic ramus, left,  ?Acute comminuted fracture of the left distal fibula and tibia with extension to the medial malleolus; possible left calcaneal fracture ?Acute comminuted fracture left distal radius ?Mildly displaced fracture of the base of proximal phalanx of right fifth toe ?Possible proximal right tibial fracture ?Posterior dislocation of the left elbow, s/p reduction in the ED ?Traumatic fall from 20 foot height ?Orthopedics consulted, s/p ORIF of the right tibial plateau,,ORIF of the left ankle , ORIF of the left distal radial fracture . ?He should be non weight bearing on the left upper extremity,  left lower extremity and right lower extremity for at least 6 weeks.  ?Pain control with tylenol, oxycodone  and dilaudid along with Robaxin.  ?Therapy eval recommending home health today.  ?Recommend outpatient follow up with orthopedics in 2 weeks.  ?Pt reports his pain is well controlled.  ?Will plan for discharge when his equipment is delivered.  ? ?ESRD on hemodialysis TTS ([place]) ?Nephrology on board for HD.  ? ? ?Type 2 diabetes mellitus with complication, without long-term current use of insulin ([place]) ?CBG (last 3)  ?Recent Labs  ?  11/24/21 ?0741 11/24/21 ?1131 11/24/21 ?1602  ?GLUCAP 98 106* 141*  ? ? ?Hemoglobin A1c is 7.  Continue with SSI.  ?No change in meds.  ? ?Hypertension associated with diabetes ([place]) ?BP elevated in setting of uncontrolled pain from multiple skeletal fractures.  Continue amlodipine, Lopressor, analgesics as needed. ? ?Anemia of chronic kidney failure ?Anemia of blood loss from the surgery.  ?S/p 2 units of prbc transfusion.  ?Hemoglobin around 9.7 post transfusion, repeat cbc is pending.  ? ? ?Mild hyponatremia:  ?Probably from ESRD. Continue to monitor.  ? ? ?Hyperlipidemia associated with type 2 diabetes mellitus ([place]) ?Hold statin for now. ? ? ?Estimated body mass index is 24.55 kg/m? as calculated from the following: ?  Height as of this encounter: '5\' 6"'$  (1.676 m). ?  Weight as of this encounter: 69 kg. ? ?Code Status: full code.  ?DVT Prophylaxis:  heparin injection 5,000 Units Start: 11/20/21 2215 ?SCDs Start: 11/20/21 2108 ?SCDs Start: 11/17/21 2158 ? ? ?Level of Care: Level of care: Med-Surg ?Family Communication: none at bedside.  ? ?Disposition Plan:     Remains inpatient appropriate:  Home when equipment is delivered.  ? ?Procedures:  ?ORIF TO R tibial.  ? ?Consultants:   ?Orthopedics ?Nephrology.  ? ?Antimicrobials:  ? ?  Anti-infectives (From admission, onward)  ? ? Start     Dose/Rate Route Frequency Ordered Stop  ? 11/20/21 2200  ceFAZolin (ANCEF) IVPB 2g/100 mL premix  Status:  Discontinued       ? 2 g ?200 mL/hr over 30 Minutes Intravenous Every 8 hours 11/20/21 2107 11/20/21 2110  ? 11/20/21 1638  vancomycin (VANCOCIN) powder  Status:  Discontinued       ?   As needed 11/20/21 1639 11/20/21 1808  ? ?  ? ? ? ?Medications ? ?Scheduled Meds: ? (feeding supplement) PROSource Plus  30 mL Oral BID BM  ? sodium chloride   Intravenous Once  ? sodium chloride   Intravenous Once  ? amLODipine  10 mg Oral Daily  ? Chlorhexidine Gluconate Cloth  6 each Topical Q0600  ? cholecalciferol  2,000 Units Oral Daily  ? cinacalcet  60 mg Oral Q supper  ? darbepoetin (ARANESP) injection - DIALYSIS  60 mcg  Intravenous Q Thu-HD  ? docusate sodium  100 mg Oral BID  ? doxercalciferol  1 mcg Intravenous Q T,Th,Sa-HD  ? glipiZIDE  10 mg Oral QAC breakfast  ? heparin injection (subcutaneous)  5,000 Units Subcutaneous Q8H  ? insulin aspart  0-6 Units Subcutaneous Q4H  ? insulin aspart  2 Units Subcutaneous TID WC  ? lanthanum  2,000 mg Oral TID WC  ? metoprolol tartrate  12.5 mg Oral BID  ? rosuvastatin  20 mg Oral Daily  ? ?Continuous Infusions: ? methocarbamol (ROBAXIN) IV    ? sodium chloride    ? ?PRN Meds:.diphenhydrAMINE, hydrALAZINE, HYDROmorphone (DILAUDID) injection, methocarbamol **OR** methocarbamol (ROBAXIN) IV, oxyCODONE, oxyCODONE, polyethylene glycol, senna-docusate, sodium chloride ? ? ? ?Subjective:  ? ?Blake Santana was seen and examined today. No new complaints.  ?Objective:  ? ?Vitals:  ? 11/24/21 0330 11/24/21 0739 11/24/21 0818 11/24/21 1258  ?BP: (!) 158/75 (!) 145/71  130/70  ?Pulse: 65 66 66 65  ?Resp: '17 18  17  '$ ?Temp: 99 ?F (37.2 ?C) 98.1 ?F (36.7 ?C)  97.9 ?F (36.6 ?C)  ?TempSrc: Oral Oral  Oral  ?SpO2: 99% 100%  93%  ?Weight:      ?Height:      ? ? ?Intake/Output Summary (Last 24 hours) at 11/24/2021 1803 ?Last data filed at 11/24/2021 1500 ?Gross per 24 hour  ?Intake 480 ml  ?Output --  ?Net 480 ml  ? ? ?Filed Weights  ? 11/20/21 1204 11/22/21 0730 11/22/21 1139  ?Weight: 63 kg 71 kg 69 kg  ? ? ? ?Exam ?General exam: Appears calm and comfortable  ?Respiratory system: Clear to auscultation. Respiratory effort normal. ?Cardiovascular system: S1 & S2 heard, RRR. No JVD, No pedal edema. ?Gastrointestinal system: Abdomen is nondistended, soft and nontender. Normal bowel sounds heard. ?Central nervous system: Alert and oriented. No focal neurological deficits. ?Extremities: Symmetric 5 x 5 power. ?Skin: No rashes, lesions or ulcers ?Psychiatry: Mood & affect appropriate.  ? ? ? ? ?Data Reviewed:  I have personally reviewed following labs and imaging studies ? ? ?CBC ?Lab Results  ?Component Value  Date  ? WBC 9.7 11/22/2021  ? RBC 3.15 (L) 11/22/2021  ? HGB 9.7 (L) 11/22/2021  ? HCT 28.9 (L) 11/22/2021  ? MCV 91.7 11/22/2021  ? MCH 30.8 11/22/2021  ? PLT 163 11/22/2021  ? MCHC 33.6 11/22/2021  ? RDW 14.6 11/22/2021  ? LYMPHSABS 1.4 11/20/2021  ? MONOABS 1.0 11/20/2021  ? EOSABS 0.1 11/20/2021  ? BASOSABS 0.0 11/20/2021  ? ? ? ?Last  metabolic panel ?Lab Results  ?Component Value Date  ? NA 132 (L) 11/22/2021  ? K 4.2 11/22/2021  ? CL 92 (L) 11/22/2021  ? CO2 23 11/22/2021  ? BUN 78 (H) 11/22/2021  ? CREATININE 9.76 (H) 11/22/2021  ? GLUCOSE 193 (H) 11/22/2021  ? GFRNONAA 6 (L) 11/22/2021  ? GFRAA 11 (L) 04/02/2020  ? CALCIUM 9.8 11/22/2021  ? PHOS 9.1 (H) 11/20/2021  ? PROT 7.3 11/17/2021  ? ALBUMIN 3.1 (L) 11/20/2021  ? LABGLOB 3.2 01/15/2021  ? AGRATIO 1.4 01/15/2021  ? BILITOT 0.7 11/17/2021  ? ALKPHOS 289 (H) 11/17/2021  ? AST 19 11/17/2021  ? ALT 16 11/17/2021  ? ANIONGAP 17 (H) 11/22/2021  ? ? ?CBG (last 3)  ?Recent Labs  ?  11/24/21 ?0741 11/24/21 ?1131 11/24/21 ?1602  ?GLUCAP 98 106* 141*  ? ?  ? ? ?Coagulation Profile: ?No results for input(s): INR, PROTIME in the last 168 hours. ? ? ? ?Radiology Studies: ?No results found. ? ? ? ? ?Hosie Poisson M.D. ?[facility] ?11/24/2021, 6:04 PM ? ?Available via Epic secure chat 7am-7pm ?After 7 pm, please refer to night coverage provider listed on amion. ? ? ? ?

## 2021-11-24 NOTE — Progress Notes (Addendum)
?[facility] ?Progress Note  ? ?Subjective:   Patient seen and examined at bedside with remote interpreter.  Reports swelling in bruised area on under side of LUE increased with mild pain overnight.  States it had felt tight but a little better this AM. No other specific complaints.  Denies CP, SOB, abdominal pain and n/v/d.  ? ?Objective ?Vitals:  ? 11/23/21 2059 11/24/21 0330 11/24/21 0739 11/24/21 0818  ?BP: (!) 144/71 (!) 158/75 (!) 145/71   ?Pulse: 66 65 66 66  ?Resp: '16 17 18   '$ ?Temp: 99 ?F (37.2 ?C) 99 ?F (37.2 ?C) 98.1 ?F (36.7 ?C)   ?TempSrc: Oral Oral Oral   ?SpO2: 99% 99% 100%   ?Weight:      ?Height:      ? ?Physical Exam ?General:well appearing, injured male in NAD ?Heart:RRR, no mrg ?Lungs:CTAB, nml WOB on 2L O2 via [place] ?Abdomen:soft, NTND ?Extremities:b/l casts on LE, no edema in thighs/hips, LUE in cast, swelling/bruising on underside of upper arm ?Dialysis Access: LU AVF +b/t  ? ?Filed Weights  ? 11/20/21 1204 11/22/21 0730 11/22/21 1139  ?Weight: 63 kg 71 kg 69 kg  ? ? ?Intake/Output Summary (Last 24 hours) at 11/24/2021 1005 ?Last data filed at 11/23/2021 1800 ?Gross per 24 hour  ?Intake 0 ml  ?Output --  ?Net 0 ml  ? ? ?Additional Objective ?Labs: ?Basic Metabolic Panel: ?Recent Labs  ?Lab 11/18/21 ?0421 11/19/21 ?1250 11/20/21 ?4235 11/20/21 ?1418 11/22/21 ?0146  ?NA 134* 136 137 134* 132*  ?K 5.6* 5.3* 5.8* 3.9 4.2  ?CL 93* 95* 94* 96* 92*  ?CO2 '23 22 23  '$ --  23  ?GLUCOSE 174* 180* 145* 110* 193*  ?BUN 83* 71* 94* 26* 78*  ?CREATININE 11.33* 11.31* 13.24* 5.80* 9.76*  ?CALCIUM 8.6* 9.4 9.4  --  9.8  ?PHOS 6.3*  --  9.1*  --   --   ? ?Liver Function Tests: ?Recent Labs  ?Lab 11/17/21 ?1634 11/20/21 ?0843  ?AST 19  --   ?ALT 16  --   ?ALKPHOS 289*  --   ?BILITOT 0.7  --   ?PROT 7.3  --   ?ALBUMIN 3.6 3.1*  ? ?CBC: ?Recent Labs  ?Lab 11/17/21 ?1634 11/17/21 ?1707 11/18/21 ?0421 11/19/21 ?1250 11/20/21 ?3614 11/20/21 ?4315 11/20/21 ?1418 11/22/21 ?0146  ?WBC 12.2*  --  10.7* 10.1  --   9.3  --  9.7  ?NEUTROABS  --   --   --  7.3  --  6.8  --   --   ?HGB 10.7*   < > 8.4* 7.8*   < > 8.2* 11.2* 9.7*  ?HCT 32.5*   < > 26.1* 24.3*   < > 25.4* 33.0* 28.9*  ?MCV 95.3  --  97.4 98.8  --  95.1  --  91.7  ?PLT 172  --  154 138*  --  145*  --  163  ? < > = values in this interval not displayed.  ? ?CBG: ?Recent Labs  ?Lab 11/23/21 ?1635 11/23/21 ?2100 11/24/21 ?0009 11/24/21 ?4008 11/24/21 ?0741  ?GLUCAP 114* 146* 123* 102* 98  ? ?Medications: ? methocarbamol (ROBAXIN) IV    ? sodium chloride    ? ? (feeding supplement) PROSource Plus  30 mL Oral BID BM  ? sodium chloride   Intravenous Once  ? sodium chloride   Intravenous Once  ? amLODipine  10 mg Oral Daily  ? Chlorhexidine Gluconate Cloth  6 each Topical Q0600  ? Chlorhexidine Gluconate  Cloth  6 each Topical [id number]  ? cholecalciferol  2,000 Units Oral Daily  ? cinacalcet  60 mg Oral Q supper  ? darbepoetin (ARANESP) injection - DIALYSIS  60 mcg Intravenous Q Thu-HD  ? docusate sodium  100 mg Oral BID  ? doxercalciferol  1 mcg Intravenous Q T,Th,Sa-HD  ? glipiZIDE  10 mg Oral QAC breakfast  ? heparin injection (subcutaneous)  5,000 Units Subcutaneous Q8H  ? insulin aspart  0-6 Units Subcutaneous Q4H  ? insulin aspart  2 Units Subcutaneous TID WC  ? lanthanum  2,000 mg Oral TID WC  ? metoprolol tartrate  12.5 mg Oral BID  ? rosuvastatin  20 mg Oral Daily  ? ? ?Dialysis Orders: ?TTS at [facility] ?3:45hr, 400/800, EDW 63.5kg, 2K/2Ca, LUE AVF, no heparin ?- No ESA/iron, last Hgb 11 ?- Hectoral 36mg IV q HD ?  ?Assessment/Plan: ?1. Fall from tree/multiple traumatic limb fractures: Ortho following, surgery 11/20/21, per notes: closed tx L elbow dislocation and ORIF to R tibial, L ankle, L wrist.   Nurse had to upwrap upper LUE dressing and cut portion of cast to expose fistula for cannulation to facilitate dialysis.  L elbow/wrist remains stabilized. ?2. ESRD: Continue HD per usual TTS schedule - Next HD on 4/25.  Possible infiltration on Saturday with bruising and  increase in swelling on under side of access arm, mildly painful, monitor.  ?3. HTN/volume: BP elevated today.  Home amlodipine/metoprolol restarted.  Does not appear volume overloaded, lungs clear.  Started on O2 overnight, denies SOB. Hard to determine weights with multiple casts. UF as tolerated.  ?4. Anemia of ESRD: Hgb 9.7 s/p 2 units pRBC.  Aranesp '60mg'$  qThurs. Follow labs.  ?5. Secondary hyperparathyroidism: Ca ok, Phos high - resume home binders (fosrenol) and sensipar. ?6. Nutrition: Continue supplement for wound healing.  Renal diet w/fluid restrictions.  ?7. T2DM ?8. HL  ?9. Dispo - possible CIR admit ? ?LJen Mow PA-C ?[facility] ?11/24/2021,10:05 AM ? LOS: 7 days  ? ?Nephrology attending: ?I have personally seen and examined the patient.  Chart reviewed.  Agree with above. ?ESRD on HD admitted after traumatic fracture.  He has some bruises around AV fistula site probably because of recent infiltration.  Still has good thrill and bruit.  No sign of infection.  We will try to use AV fistula during dialysis tomorrow.  Continue current management. ? ?D.  BCarolin Sicks MD ?[facility] ? ? ?

## 2021-11-24 NOTE — Progress Notes (Signed)
Orthopaedic Trauma Progress Note ? ?SUBJECTIVE: Doing okay today, no specific complaints.  Pain has been fairly manageable on current regimen.  States he is interested in pursuing CIR. ? ?OBJECTIVE:  ?Vitals:  ? 11/24/21 0739 11/24/21 0818  ?BP: (!) 145/71   ?Pulse: 66 66  ?Resp: 18   ?Temp: 98.1 ?F (36.7 ?C)   ?SpO2: 100%   ? ? ?General: Sitting up in bed, no acute distress ?Respiratory: No increased work of breathing.  ?Left upper extremity: Long-arm splint in place.  Nontender above splint.  Able to wiggle fingers.  Endorses sensation to light touch over hand.  Fingers warm, well perfused ?Left lower extremity: Well-padded, well fitting short leg splint in place.  Non-tender above splint.  Endorses sensation to light touch over toes.  Able to wiggle toes on exam.  Toes warm with brisk cap refill  ?RLE: Dressing removed, incisions clean, dry, intact.  Tenderness about the knee as expected. Tolerates gentle ankle range of motion but does endorse some soreness with this.  Able to get knee fully extended.  Sensation intact distally.  Wiggles toes on his own. + DP pulse ? ?IMAGING: Stable post op imaging.  ? ?LABS:  ?Results for orders placed or performed during the hospital encounter of 11/17/21 (from the past 24 hour(s))  ?Glucose, capillary     Status: Abnormal  ? Collection Time: 11/23/21  8:33 AM  ?Result Value Ref Range  ? Glucose-Capillary 125 (H) 70 - 99 mg/dL  ?Glucose, capillary     Status: Abnormal  ? Collection Time: 11/23/21 12:25 PM  ?Result Value Ref Range  ? Glucose-Capillary 180 (H) 70 - 99 mg/dL  ?Glucose, capillary     Status: Abnormal  ? Collection Time: 11/23/21  4:35 PM  ?Result Value Ref Range  ? Glucose-Capillary 114 (H) 70 - 99 mg/dL  ?Glucose, capillary     Status: Abnormal  ? Collection Time: 11/23/21  9:00 PM  ?Result Value Ref Range  ? Glucose-Capillary 146 (H) 70 - 99 mg/dL  ?Glucose, capillary     Status: Abnormal  ? Collection Time: 11/24/21 12:09 AM  ?Result Value Ref Range  ?  Glucose-Capillary 123 (H) 70 - 99 mg/dL  ?Glucose, capillary     Status: Abnormal  ? Collection Time: 11/24/21  3:31 AM  ?Result Value Ref Range  ? Glucose-Capillary 102 (H) 70 - 99 mg/dL  ?Glucose, capillary     Status: None  ? Collection Time: 11/24/21  7:41 AM  ?Result Value Ref Range  ? Glucose-Capillary 98 70 - 99 mg/dL  ? ? ?ASSESSMENT: Blake Santana is a 51 y.o. male, 4 Days Post-Op s/p ?ORIF RIGHT TIBIAL PLATEAU ?ORIF LEFT ANKLE PILON FRACTURE ?ORIF LEFT DISTAL RADIAL FRACTURE ?NON-OPERATIVE MANAGEMENT LEFT INFERIOR PUBIC RAMUS FRACTURE ? ?CV/Blood loss: Hemoglobin 9.7 on 11/22/2021.   ? ?PLAN: ?Weightbearing:  ?- RUE: WBAT ?- LUE: NWB ?- RLE: NWB ?- LLE: NWB ?ROM:  ?- RUE: Unrestricted ROM ?- LUE: Okay for shoulder motion as tolerated.  Maintain long-arm splint ?- RLE: Unrestricted hip, knee, ankle ROM as tolerated ?- LLE: Maintain short leg splint.  Okay for unrestricted knee and hip ROM as tolerated ?Incisional and dressing care:   ?- LUE: Maintain splint.  Okay to make a window in Ace wrap for hemodialysis access ?- RLE: Dressing changed today.  Continue to change Mepilex as needed.  Okay to leave open to air if no drainage. ?- LLE: Maintain short leg splint, keep clean and dry ?Showering: Okay to shower,  keep LUE and LLE splint clean and dry.  RLE incisions may get wet ?Orthopedic device(s): Splint LUE, LLE ?Pain management:  ?1. Tylenol 1000 mg q 6 hours scheduled ?2. Robaxin 500 mg q 6 hours PRN ?3. Oxycodone 5-15 mg q 4 hours PRN ?4. Dilaudid 0.5-1 mg q 3 hours PRN ?VTE prophylaxis:  Heparin ?ID: Ancef 2gm post op completed ?Foley/Lines:  No foley, KVO IVFs ?Impediments to Fracture Healing: Vitamin D level 21, start on D3 supplementation ?Dispo: Therapies as tolerated, PT/OT recommending CIR.  Patient amenable to this.  Patient okay for discharge from ortho standpoint once cleared by therapies and medicine team.  Patient will likely remain nonweightbearing on bilateral lower extremities for  at least 6 weeks. ? ?Follow - up plan: 2 weeks ? ? ?Contact information:  Katha Hamming MD, Rushie Nyhan PA-C. After hours and holidays please check [email or web address] for group call information for [facility] ? ? ?Gwinda Passe, PA-C ?[phone] (office) ?[email or web address] ? ? ? ?

## 2021-11-25 LAB — GLUCOSE, CAPILLARY
Glucose-Capillary: 137 mg/dL — ABNORMAL HIGH (ref 70–99)
Glucose-Capillary: 164 mg/dL — ABNORMAL HIGH (ref 70–99)
Glucose-Capillary: 189 mg/dL — ABNORMAL HIGH (ref 70–99)
Glucose-Capillary: 56 mg/dL — ABNORMAL LOW (ref 70–99)
Glucose-Capillary: 61 mg/dL — ABNORMAL LOW (ref 70–99)
Glucose-Capillary: 63 mg/dL — ABNORMAL LOW (ref 70–99)
Glucose-Capillary: 67 mg/dL — ABNORMAL LOW (ref 70–99)
Glucose-Capillary: 72 mg/dL (ref 70–99)
Glucose-Capillary: 76 mg/dL (ref 70–99)

## 2021-11-25 MED ORDER — GLUCOSE 40 % PO GEL
1.0000 | ORAL | Status: AC
  Administered 2021-11-25: 31 g via ORAL

## 2021-11-25 MED ORDER — GLUCOSE 40 % PO GEL
ORAL | Status: AC
  Filled 2021-11-25: qty 1.21

## 2021-11-25 MED ORDER — INSULIN ASPART 100 UNIT/ML IJ SOLN
0.0000 [IU] | Freq: Three times a day (TID) | INTRAMUSCULAR | Status: DC
  Administered 2021-11-25: 1 [IU] via SUBCUTANEOUS

## 2021-11-25 MED ORDER — GLUCOSE 40 % PO GEL
ORAL | Status: AC
  Filled 2021-11-25: qty 1

## 2021-11-25 MED ORDER — DEXTROSE-NACL 5-0.9 % IV SOLN: INTRAVENOUS | Status: DC

## 2021-11-25 NOTE — Progress Notes (Signed)
Hypoglycemic Event ? ?CBG: 63 ? ?Treatment: 1 tube glucose gel ? ?Symptoms: None ? ?Follow-up CBG: Time: 6283 CBG Result:67 ? ?Possible Reasons for Event: Unknown ? ?Comments/MD notified: Clarene Essex, NP ? ? ? ?[place] ? ?Edit: D5NS initiated at 30 ml/hr per order following the 67 value. ?

## 2021-11-25 NOTE — Progress Notes (Addendum)
?[facility] ?Progress Note  ? ?Subjective:   Patient seen and examined at bedside in dialysis.  Complains of pain from injuries.  Denies CP, SOB, abdominal pain and n/v/d.  Hypoglycemic event this AM. ? ?Objective ?Vitals:  ? 11/25/21 0900 11/25/21 0930 11/25/21 1000 11/25/21 1030  ?BP: (!) 114/54 (!) 113/55 (!) 104/54 (!) 112/58  ?Pulse: 63 (!) 57 60 61  ?Resp:      ?Temp:      ?TempSrc:      ?SpO2:      ?Weight:      ?Height:      ? ?Physical Exam ?General:well appearing, injured male in NAD ?Heart:RRR, no mrg ?Lungs:CTAB anteriorly  ?Abdomen:soft, NTND ?Extremities:no edema in hips/thighs, b/l LE in cast, LUE cast w/swelling/bruising on underside of arm from infiltration  ?Dialysis Access: LU AVF in use  ? ?Filed Weights  ? 11/22/21 0730 11/22/21 1139 11/25/21 0832  ?Weight: 71 kg 69 kg 69.4 kg  ? ? ?Intake/Output Summary (Last 24 hours) at 11/25/2021 1044 ?Last data filed at 11/25/2021 0700 ?Gross per 24 hour  ?Intake 241.17 ml  ?Output --  ?Net 241.17 ml  ? ? ?Additional Objective ?Labs: ?Basic Metabolic Panel: ?Recent Labs  ?Lab 11/20/21 ?8841 11/20/21 ?1418 11/22/21 ?0146 11/24/21 ?1853  ?NA 137 134* 132* 131*  ?K 5.8* 3.9 4.2 4.9  ?CL 94* 96* 92* 88*  ?CO2 23  --  23 26  ?GLUCOSE 145* 110* 193* 111*  ?BUN 94* 26* 78* 91*  ?CREATININE 13.24* 5.80* 9.76* 10.47*  ?CALCIUM 9.4  --  9.8 9.4  ?PHOS 9.1*  --   --  6.5*  ? ?Liver Function Tests: ?Recent Labs  ?Lab 11/20/21 ?6606 11/24/21 ?1853  ?ALBUMIN 3.1* 2.7*  ? ?CBC: ?Recent Labs  ?Lab 11/19/21 ?1250 11/20/21 ?3016 11/20/21 ?0109 11/20/21 ?1418 11/22/21 ?0146 11/24/21 ?1827  ?WBC 10.1  --  9.3  --  9.7 9.7  ?NEUTROABS 7.3  --  6.8  --   --  6.7  ?HGB 7.8*   < > 8.2* 11.2* 9.7* 10.0*  ?HCT 24.3*   < > 25.4* 33.0* 28.9* 30.6*  ?MCV 98.8  --  95.1  --  91.7 95.0  ?PLT 138*  --  145*  --  163 251  ? < > = values in this interval not displayed.  ? ? ?Medications: ? sodium chloride    ? sodium chloride    ? dextrose 5 % and 0.9% NaCl 30 mL/hr at  11/25/21 0700  ? methocarbamol (ROBAXIN) IV    ? sodium chloride    ? ? (feeding supplement) PROSource Plus  30 mL Oral BID BM  ? sodium chloride   Intravenous Once  ? sodium chloride   Intravenous Once  ? amLODipine  10 mg Oral Daily  ? Chlorhexidine Gluconate Cloth  6 each Topical Q0600  ? cholecalciferol  2,000 Units Oral Daily  ? cinacalcet  60 mg Oral Q supper  ? darbepoetin (ARANESP) injection - DIALYSIS  60 mcg Intravenous Q Thu-HD  ? docusate sodium  100 mg Oral BID  ? doxercalciferol  1 mcg Intravenous Q T,Th,Sa-HD  ? heparin injection (subcutaneous)  5,000 Units Subcutaneous Q8H  ? insulin aspart  0-6 Units Subcutaneous Q4H  ? lanthanum  2,000 mg Oral TID WC  ? mouth rinse  15 mL Mouth Rinse BID  ? metoprolol tartrate  12.5 mg Oral BID  ? rosuvastatin  20 mg Oral Daily  ? ? ?Dialysis Orders: ?TTS at [facility] ?3:45hr,  400/800, EDW 63.5kg, 2K/2Ca, LUE AVF, no heparin ?- No ESA/iron, last Hgb 11 ?- Hectoral 50mg IV q HD ?  ?Assessment/Plan: ?1. Fall from tree/multiple traumatic limb fractures: Ortho following, surgery 11/20/21, per notes: closed tx L elbow dislocation and ORIF to R tibial, L ankle, L wrist.   Nurse had to upwrap upper LUE dressing and cut portion of cast to expose fistula for cannulation to facilitate dialysis.  L elbow/wrist remains stabilized. ?2. ESRD: Continue HD per usual TTS schedule - Next HD today.  Possible infiltration on Saturday with bruising and increase in swelling on under side of access arm, mildly painful, monitor.  ?3. HTN/volume: BP dropping with HD.  Home amlodipine/metoprolol restarted.  Does not appear volume overloaded, lungs clear. Hard to determine weights with multiple casts. UF as tolerated.  ?4. Anemia of ESRD: Hgb 10.0 s/p 2 units pRBC.  Aranesp '60mg'$  qThurs. Follow labs.  ?5. Secondary hyperparathyroidism: Ca ok, Phos improving - continue home binders (fosrenol) and sensipar. ?6. Nutrition: Continue supplement for wound healing.  Renal diet w/fluid restrictions.  ?7.  T2DM ?8. HL  ?9. Dispo - plan for d/c home with home health ? ?LJen Mow PA-C ?[facility] ?11/25/2021,10:44 AM ? LOS: 8 days  ? ?Nephrology attending: ?I have personally seen and examined the patient.  Chart reviewed.  I agree with above. ?ESRD on HD, receiving regular dialysis today.  The AV fistula has no issue and ecchymosis around the fistula site is improving.  No sign of infection.  Hemoglobin improved.  Continue current management.  Social worker following for safe discharge planning. ? ?D.  BCarolin Sicks MD ?[facility] ? ? ?

## 2021-11-25 NOTE — Progress Notes (Signed)
? ? ? [facility] ?                                                                            ? ? ?Blake Santana, is a 51 y.o. male, DOB - 1971-07-22, UJW:[id number] ?Admit date - 11/17/2021    ?Outpatient Primary MD for the patient is [name], NP ? ?LOS - 8  days ? ? ? ?Brief summary  ? ?Blake Santana is a Spanish-speaking 51 y.o. male with medical history significant for ESRD on TTS HD, T2DM with diabetic retinopathy, anemia of ESRD, HTN, HLD, PAD who is admitted with multiple skeletal fractures after fall from 20 foot height.  Orthopedics consulting for further management. ?Pt underwent ORIF and therapy initially recommended CIR, later on with progression, recommending home health. Currently waiting for equipment to be delivered.  ? ? ?Assessment & Plan  ? ? ?Assessment and Plan: ?Closed fracture of inferior pubic ramus, left,  ?Acute comminuted fracture of the left distal fibula and tibia with extension to the medial malleolus; possible left calcaneal fracture ?Acute comminuted fracture left distal radius ?Mildly displaced fracture of the base of proximal phalanx of right fifth toe ?Possible proximal right tibial fracture ?Posterior dislocation of the left elbow, s/p reduction in the ED ?Traumatic fall from 20 foot height ?Orthopedics consulted, s/p ORIF of the right tibial plateau,,ORIF of the left ankle , ORIF of the left distal radial fracture . ?He should be non weight bearing on the left upper extremity,  left lower extremity and right lower extremity for at least 6 weeks.  ?Pain control with tylenol, oxycodone  and dilaudid along with Robaxin.  ?Therapy eval recommending home health .  ?Recommend outpatient follow up with orthopedics in 2 weeks.  ?Pt reports his pain is well controlled.  ?Will plan for discharge when his equipment is delivered.  ? ?ESRD on hemodialysis TTS ([place]) ?Nephrology on board for HD.  ? ? ?Type 2 diabetes mellitus with complication, without  long-term current use of insulin ([place]) ?CBG (last 3)  ?Recent Labs  ?  11/25/21 ?0658 11/25/21 ?0739 11/25/21 ?1626  ?GLUCAP 67* 76 164*  ? ? ?Hemoglobin A1c is 7. Continue with SSI.  ? Pt had multiple episodes of hypoglycemia with glipizide which was discontinued.  ? ? ?Hypertension associated with diabetes ([place]) ?BP parameters optimal today.  ?Resume amlodipine.  ? ? ?Anemia of chronic kidney failure ?Anemia of blood loss from the surgery.  ?S/p 2 units of prbc transfusion.  ?Hemoglobin around 9.7 post transfusion, repeat cbc shows stable hemoglobin.  ? ? ?Mild hyponatremia:  ?Probably from ESRD. Continue to monitor.  ? ? ?Hyperlipidemia associated with type 2 diabetes mellitus ([place]) ?Resume statin.  ? ? ?Estimated body mass index is 24.69 kg/m? as calculated from the following: ?  Height as of this encounter: '5\' 6"'$  (1.676 m). ?  Weight as of this encounter: 69.4 kg. ? ?Code Status: full code.  ?DVT Prophylaxis:  heparin injection 5,000 Units Start: 11/20/21 2215 ?SCDs Start: 11/20/21 2108 ?SCDs Start: 11/17/21 2158 ? ? ?Level of Care: Level of care: Med-Surg ?Family Communication: none at bedside.  ? ?Disposition Plan:     Remains inpatient appropriate:  Home when  equipment is delivered.  ? ?Procedures:  ?ORIF TO R tibial.  ? ?Consultants:   ?Orthopedics ?Nephrology.  ? ?Antimicrobials:  ? ?Anti-infectives (From admission, onward)  ? ? Start     Dose/Rate Route Frequency Ordered Stop  ? 11/20/21 2200  ceFAZolin (ANCEF) IVPB 2g/100 mL premix  Status:  Discontinued       ? 2 g ?200 mL/hr over 30 Minutes Intravenous Every 8 hours 11/20/21 2107 11/20/21 2110  ? 11/20/21 1638  vancomycin (VANCOCIN) powder  Status:  Discontinued       ?   As needed 11/20/21 1639 11/20/21 1808  ? ?  ? ? ? ?Medications ? ?Scheduled Meds: ? (feeding supplement) PROSource Plus  30 mL Oral BID BM  ? sodium chloride   Intravenous Once  ? sodium chloride   Intravenous Once  ? amLODipine  10 mg Oral Daily  ? Chlorhexidine Gluconate Cloth  6  each Topical Q0600  ? cholecalciferol  2,000 Units Oral Daily  ? cinacalcet  60 mg Oral Q supper  ? darbepoetin (ARANESP) injection - DIALYSIS  60 mcg Intravenous Q Thu-HD  ? docusate sodium  100 mg Oral BID  ? doxercalciferol  1 mcg Intravenous Q T,Th,Sa-HD  ? heparin injection (subcutaneous)  5,000 Units Subcutaneous Q8H  ? insulin aspart  0-6 Units Subcutaneous TID WC  ? lanthanum  2,000 mg Oral TID WC  ? mouth rinse  15 mL Mouth Rinse BID  ? metoprolol tartrate  12.5 mg Oral BID  ? rosuvastatin  20 mg Oral Daily  ? ?Continuous Infusions: ? dextrose 5 % and 0.9% NaCl 30 mL/hr at 11/25/21 0700  ? methocarbamol (ROBAXIN) IV    ? sodium chloride    ? ?PRN Meds:.diphenhydrAMINE, hydrALAZINE, HYDROmorphone (DILAUDID) injection, methocarbamol **OR** methocarbamol (ROBAXIN) IV, oxyCODONE, oxyCODONE, polyethylene glycol, senna-docusate, sodium chloride ? ? ? ?Subjective:  ? ?Blake Santana was seen and examined today. No new complaints.  ?Objective:  ? ?Vitals:  ? 11/25/21 1230 11/25/21 1240 11/25/21 1250 11/25/21 1413  ?BP: 116/61 126/64 115/63 (!) 154/74  ?Pulse: 63 65 63 69  ?Resp:  '18 18 18  '$ ?Temp:  98.1 ?F (36.7 ?C) 98.1 ?F (36.7 ?C) 98.1 ?F (36.7 ?C)  ?TempSrc:    Oral  ?SpO2:   98% 97%  ?Weight:      ?Height:      ? ? ?Intake/Output Summary (Last 24 hours) at 11/25/2021 1812 ?Last data filed at 11/25/2021 1500 ?Gross per 24 hour  ?Intake 121.17 ml  ?Output 2596 ml  ?Net -2474.83 ml  ? ? ?Filed Weights  ? 11/22/21 0730 11/22/21 1139 11/25/21 0832  ?Weight: 71 kg 69 kg 69.4 kg  ? ? ? ?Exam ?General exam: Appears calm and comfortable  ?Respiratory system: Clear to auscultation. Respiratory effort normal. ?Cardiovascular system: S1 & S2 heard, RRR. No JVD, . No pedal edema. ?Gastrointestinal system: Abdomen is nondistended, soft and nontender.  Normal bowel sounds heard. ?Central nervous system: Alert and oriented. No focal neurological deficits. ?Extremities: painful ROM of the extremities.  ?Skin: No rashes,  lesions or ulcers ?Psychiatry:  Mood & affect appropriate.  ? ? ? ? ? ?Data Reviewed:  I have personally reviewed following labs and imaging studies ? ? ?CBC ?Lab Results  ?Component Value Date  ? WBC 9.7 11/24/2021  ? RBC 3.22 (L) 11/24/2021  ? HGB 10.0 (L) 11/24/2021  ? HCT 30.6 (L) 11/24/2021  ? MCV 95.0 11/24/2021  ? MCH 31.1 11/24/2021  ? PLT 251 11/24/2021  ?  MCHC 32.7 11/24/2021  ? RDW 14.5 11/24/2021  ? LYMPHSABS 1.4 11/24/2021  ? MONOABS 0.7 11/24/2021  ? EOSABS 0.9 (H) 11/24/2021  ? BASOSABS 0.0 11/24/2021  ? ? ? ?Last metabolic panel ?Lab Results  ?Component Value Date  ? NA 131 (L) 11/24/2021  ? K 4.9 11/24/2021  ? CL 88 (L) 11/24/2021  ? CO2 26 11/24/2021  ? BUN 91 (H) 11/24/2021  ? CREATININE 10.47 (H) 11/24/2021  ? GLUCOSE 111 (H) 11/24/2021  ? GFRNONAA 5 (L) 11/24/2021  ? GFRAA 11 (L) 04/02/2020  ? CALCIUM 9.4 11/24/2021  ? PHOS 6.5 (H) 11/24/2021  ? PROT 7.3 11/17/2021  ? ALBUMIN 2.7 (L) 11/24/2021  ? LABGLOB 3.2 01/15/2021  ? AGRATIO 1.4 01/15/2021  ? BILITOT 0.7 11/17/2021  ? ALKPHOS 289 (H) 11/17/2021  ? AST 19 11/17/2021  ? ALT 16 11/17/2021  ? ANIONGAP 17 (H) 11/24/2021  ? ? ?CBG (last 3)  ?Recent Labs  ?  11/25/21 ?0658 11/25/21 ?0739 11/25/21 ?1626  ?GLUCAP 67* 76 164*  ? ?  ? ? ?Coagulation Profile: ?No results for input(s): INR, PROTIME in the last 168 hours. ? ? ? ?Radiology Studies: ?No results found. ? ? ? ? ?Hosie Poisson M.D. ?[facility] ?11/25/2021, 6:12 PM ? ?Available via Epic secure chat 7am-7pm ?After 7 pm, please refer to night coverage provider listed on amion. ? ? ? ?

## 2021-11-25 NOTE — Progress Notes (Signed)
?  ?  Durable Medical Equipment  ?(From admission, onward)  ?  ? ? ?  ? ?  Start     Ordered  ? Unscheduled  For home use only DME lightweight manual wheelchair with seat cushion  Once       ?Comments: Patient suffers from multiple fractures and nonweightbearing on 3 extremities which impairs their ability to perform daily activities like bathing, dressing, and toileting in the home.  A walker will not resolve  ?issue with performing activities of daily living. A wheelchair will allow patient to safely perform daily activities. Patient is not able to propel themselves in the home using a standard weight wheelchair due to general weakness. Patient can self propel in the lightweight wheelchair. Length of need 6 months . ?Accessories: elevating leg rests (ELRs), wheel locks, extensions and anti-tippers.  ? 11/25/21 1000  ? Unscheduled  For home use only DME 3 n 1  Once       ? 11/25/21 1000  ? Unscheduled  For home use only DME Other see comment  Once       ?Comments: Hoyer lift  ?Question:  Length of Need  Answer:  6 Months  ? 11/25/21 1000  ? ?  ?  ? ?  ?  ?

## 2021-11-25 NOTE — Plan of Care (Signed)
Oral care bundle initiated per protocol by this RN (not previously implemented). PRN pain per orders. Hypoglycemia this AM (4/25) (see hypoglycemia note).  ? ? ?Problem: Education: ?Goal: Knowledge of General Education information will improve ?Description: Including pain rating scale, medication(s)/side effects and non-pharmacologic comfort measures ?11/25/2021 0450 by Jesse Sans, RN ?Outcome: Progressing ?11/24/2021 2019 by Jesse Sans, RN ?Outcome: Progressing ?  ?Problem: Health Behavior/Discharge Planning: ?Goal: Ability to manage health-related needs will improve ?11/25/2021 0450 by Jesse Sans, RN ?Outcome: Progressing ?11/24/2021 2019 by Jesse Sans, RN ?Outcome: Progressing ?  ?Problem: Clinical Measurements: ?Goal: Ability to maintain clinical measurements within normal limits will improve ?11/25/2021 0450 by Jesse Sans, RN ?Outcome: Progressing ?11/24/2021 2019 by Jesse Sans, RN ?Outcome: Progressing ?Goal: Will remain free from infection ?11/25/2021 0450 by Jesse Sans, RN ?Outcome: Progressing ?11/24/2021 2019 by Jesse Sans, RN ?Outcome: Progressing ?Goal: Diagnostic test results will improve ?11/25/2021 0450 by Jesse Sans, RN ?Outcome: Progressing ?11/24/2021 2019 by Jesse Sans, RN ?Outcome: Progressing ?Goal: Respiratory complications will improve ?11/25/2021 0450 by Jesse Sans, RN ?Outcome: Progressing ?11/24/2021 2019 by Jesse Sans, RN ?Outcome: Progressing ?Goal: Cardiovascular complication will be avoided ?11/25/2021 0450 by Jesse Sans, RN ?Outcome: Progressing ?11/24/2021 2019 by Jesse Sans, RN ?Outcome: Progressing ?  ?Problem: Activity: ?Goal: Risk for activity intolerance will decrease ?11/25/2021 0450 by Jesse Sans, RN ?Outcome: Progressing ?11/24/2021 2019 by Jesse Sans, RN ?Outcome: Progressing ?  ?Problem: Nutrition: ?Goal: Adequate nutrition will be maintained ?11/25/2021 0450 by Jesse Sans, RN ?Outcome:  Progressing ?11/24/2021 2019 by Jesse Sans, RN ?Outcome: Progressing ?  ?Problem: Coping: ?Goal: Level of anxiety will decrease ?11/25/2021 0450 by Jesse Sans, RN ?Outcome: Progressing ?11/24/2021 2019 by Jesse Sans, RN ?Outcome: Progressing ?  ?Problem: Elimination: ?Goal: Will not experience complications related to bowel motility ?11/25/2021 0450 by Jesse Sans, RN ?Outcome: Progressing ?11/24/2021 2019 by Jesse Sans, RN ?Outcome: Progressing ?Goal: Will not experience complications related to urinary retention ?11/25/2021 0450 by Jesse Sans, RN ?Outcome: Progressing ?11/24/2021 2019 by Jesse Sans, RN ?Outcome: Progressing ?  ?Problem: Pain Managment: ?Goal: General experience of comfort will improve ?11/25/2021 0450 by Jesse Sans, RN ?Outcome: Progressing ?11/24/2021 2019 by Jesse Sans, RN ?Outcome: Progressing ?  ?Problem: Safety: ?Goal: Ability to remain free from injury will improve ?11/25/2021 0450 by Jesse Sans, RN ?Outcome: Progressing ?11/24/2021 2019 by Jesse Sans, RN ?Outcome: Progressing ?  ?Problem: Skin Integrity: ?Goal: Risk for impaired skin integrity will decrease ?11/25/2021 0450 by Jesse Sans, RN ?Outcome: Progressing ?11/24/2021 2019 by Jesse Sans, RN ?Outcome: Progressing ?  ?

## 2021-11-25 NOTE — Progress Notes (Signed)
PT Cancellation Note ? ?Patient Details ?Name: Blake Santana ?MRN: [id number] ?DOB: [birth year] ? ? ?Cancelled Treatment:    Reason Eval/Treat Not Completed: Patient at procedure or test/unavailable (HD). Will follow-up for PT treatment with in-person Spanish interpreter appt at 2p. ? ?Mabeline Caras, PT, DPT ?Acute Rehabilitation Services  ?Pager [phone] ?Office [phone] ? ?Derry Lory ?11/25/2021, 8:44 AM ? ? ?

## 2021-11-25 NOTE — Procedures (Signed)
Patient was seen on dialysis and the procedure was supervised.  BFR 400  Via AVF BP is  114/54. ? ? Patient appears to be tolerating treatment well. ? ?[place] ?11/25/2021 ? ?

## 2021-11-25 NOTE — Plan of Care (Signed)

## 2021-11-25 NOTE — Progress Notes (Signed)
Inpatient Diabetes Program Recommendations ? ?AACE/ADA: New Consensus Statement on Inpatient Glycemic Control (2015) ? ?Target Ranges:  Prepandial:   less than 140 mg/dL ?     Peak postprandial:   less than 180 mg/dL (1-2 hours) ?     Critically ill patients:  140 - 180 mg/dL  ? ?Lab Results  ?Component Value Date  ? GLUCAP 76 11/25/2021  ? HGBA1C 7.0 (H) 11/18/2021  ? ? ?Review of Glycemic Control ? Latest Reference Range & Units 11/24/21 23:33 11/25/21 04:12 11/25/21 04:37 11/25/21 05:09 11/25/21 06:38 11/25/21 06:58 11/25/21 07:39  ?Glucose-Capillary 70 - 99 mg/dL 104 (H) 61 (L) 56 (L) 72 63 (L) 67 (L) 76  ? ?Diabetes history: DM 2 ?Outpatient Diabetes medications:  ?Glucotrol 10 mg bid ?Current orders for Inpatient glycemic control:  ?Novolog 0-6 units q 4 hours ? ?Inpatient Diabetes Program Recommendations:   ?Consider changing Novolog 0-6 units to tid with meals (instead of q 4 hours)?  ? ?Thanks,  ?Adah Perl, RN, BC-ADM ?Inpatient Diabetes Coordinator ?Pager [phone]  (8a-5p) ? ? ? ?

## 2021-11-25 NOTE — Progress Notes (Addendum)
Occupational Therapy Treatment ?Patient Details ?Name: Blake Santana ?MRN: [id number] ?DOB: 07-Jun-1971 ?Today's Date: 11/25/2021 ? ? ?History of present illness Pt is a 51 y.o. male admitted 11/17/21 as level 2 trauma after ~20 ft fall from ladder while cutting tree branches. Pt sustained L elbow dislocation s/p reduction, L pilon fx, R tibial plateau fx, R 5th toe fx, pubic ramus fx. S/p L pilon ORIF, R tibial ORIF, L distal radius ORIF, closed tx L elbow dislocation. PMH includes HTN, CHF, ESRD (HD TTS), DM, cataracts. ?  ?OT comments ? Focus of session was to introduce compensatory strategies for LB/UB dressing given multiple WB precautions, at chair level or in supine. Family member present for session, pt able to complete UB dressing with minA, cues for donning top with LUE first/ and doffing last. Pt mod A for simulated LB dressing, pt able to reach down towards feet to don pillowcase over LLE while long sitting in chair. Educated pt on primarily using RUE (or assist from family members) to pull up clothing and be cautious not to bear weight through L elbow during dressing. Pt/family member verbalized understanding. Pt presenting with impairments listed below, will follow acutely. Continue to recommend HHOT at d/c.  ? ?Recommendations for follow up therapy are one component of a multi-disciplinary discharge planning process, led by the attending physician.  Recommendations may be updated based on patient status, additional functional criteria and insurance authorization. ?   ?Follow Up Recommendations ? Home health OT  ?  ?Assistance Recommended at Discharge Frequent or constant Supervision/Assistance  ?Patient can return home with the following ? Two people to help with walking and/or transfers;Two people to help with bathing/dressing/bathroom;Assist for transportation;Help with stairs or ramp for entrance;Assistance with cooking/housework ?  ?Equipment Recommendations ? BSC/3in1;Wheelchair  (measurements OT);Wheelchair cushion (measurements OT);Hospital bed;Other (comment) (hoyer lift)  ?  ?Recommendations for Other Services   ? ?  ?Precautions / Restrictions Precautions ?Precautions: Fall ?Required Braces or Orthoses: Splint/Cast;Sling ?Splint/Cast: LUE sling, L ankle cast ?Restrictions ?Weight Bearing Restrictions: Yes ?LUE Weight Bearing: Non weight bearing ?RLE Weight Bearing: Non weight bearing ?LLE Weight Bearing: Non weight bearing ?Other Position/Activity Restrictions: Ortho PA verbal clarification - unrestricted AROM L shoulder, L knee/hip, R ankle/knee/hip; pt NOT to WB through L elbow  ? ? ?  ? ?Mobility Bed Mobility ?  ?  ?  ?  ?  ?  ?  ?General bed mobility comments: OOB in chair upon arrival ?  ? ?Transfers ?  ?  ?  ?  ?  ?  ?  ?  ?  ?  ?  ?  ?Balance Overall balance assessment: Needs assistance ?Sitting-balance support: No upper extremity supported, Feet unsupported ?Sitting balance-Leahy Scale: Fair ?  ?  ?  ?  ?  ?  ?  ?  ?  ?  ?  ?  ?  ?  ?  ?  ?   ? ?ADL either performed or assessed with clinical judgement  ? ?ADL Overall ADL's : Needs assistance/impaired ?  ?  ?  ?  ?  ?  ?  ?  ?Upper Body Dressing : Minimal assistance;Cueing for compensatory techniques;Cueing for safety;Cueing for sequencing ?Upper Body Dressing Details (indicate cue type and reason): dons scrub top while up in chair ?Lower Body Dressing: Moderate assistance ?Lower Body Dressing Details (indicate cue type and reason): able to don pillow case over BUE to initiate LB dressing ?  ?  ?  ?  ?  ?  ?  ?  ?  ? ?  Extremity/Trunk Assessment Upper Extremity Assessment ?Upper Extremity Assessment: RUE deficits/detail ?RUE Deficits / Details: s/p elbow dislocation reduction; functionally >4/5 strength throughout, denies pain with ROM ?LUE Deficits / Details: s/p L distal radius ORIF in cast; able to partially flex/ext fingers, noted swelling; L shoulder with good AAROM although painful, gross function at least 3/5 ?  ?Lower  Extremity Assessment ?Lower Extremity Assessment: Defer to PT evaluation ?  ?  ?  ? ?Vision   ?Additional Comments: hx diabetic retinopathy ?  ?Perception Perception ?Perception: Not tested ?  ?Praxis Praxis ?Praxis: Not tested ?  ? ?Cognition Arousal/Alertness: Awake/alert ?Behavior During Therapy: [facility] for tasks assessed/performed ?Overall Cognitive Status: Within Functional Limits for tasks assessed ?  ?  ?  ?  ?  ?  ?  ?  ?  ?  ?  ?  ?  ?  ?  ?  ?General Comments: via [place] interpreter E[place]rain [phone] ?  ?  ?   ?Exercises   ? ?  ?Shoulder Instructions   ? ? ?  ?General Comments VSS on RA; continued to encouraged BUE shoulder ROM and elevation for edema control, pt and spouse verbalize understanding  ? ? ?Pertinent Vitals/ Pain       Pain Assessment ?Pain Assessment: Faces ?Pain Score: 7  ?Faces Pain Scale: Hurts even more ?Pain Location: R hip area ?Pain Descriptors / Indicators: Discomfort, Grimacing, Guarding, Sore ?Pain Intervention(s): Limited activity within patient's tolerance, Monitored during session, Patient requesting pain meds-RN notified ? ?Home Living   ?  ?  ?  ?  ?  ?  ?  ?  ?  ?  ?  ?  ?  ?  ?  ?  ?  ?  ? ?  ?Prior Functioning/Environment    ?  ?  ?  ?   ? ?Frequency ? Min 3X/week  ? ? ? ? ?  ?Progress Toward Goals ? ?OT Goals(current goals can now be found in the care plan section) ? Progress towards OT goals: Progressing toward goals ? ?Acute Rehab OT Goals ?Patient Stated Goal: none stated ?OT Goal Formulation: With patient/family ?Time For Goal Achievement: 12/04/21 ?Potential to Achieve Goals: Good ?ADL Goals ?Pt Will Perform Lower Body Bathing: with caregiver independent in assisting;with min assist;bed level ?Pt Will Perform Lower Body Dressing: bed level;with caregiver independent in assisting;with min assist ?Pt Will Transfer to Toilet: bedside commode;anterior/posterior transfer;with +2 assist;with mod assist ?Additional ADL Goal #1: Bed mobility with mod A in preparation for ADL tasks   ?Plan Discharge plan needs to be updated;Frequency needs to be updated   ? ?Co-evaluation ? ? ?   ?  ?  ?  ?  ? ?  ?AM-PAC OT "6 Clicks" Daily Activity     ?Outcome Measure ? ? Help from another person eating meals?: None ?Help from another person taking care of personal grooming?: A Little ?Help from another person toileting, which includes using toliet, bedpan, or urinal?: A Lot ?Help from another person bathing (including washing, rinsing, drying)?: A Lot ?Help from another person to put on and taking off regular upper body clothing?: A Lot ?Help from another person to put on and taking off regular lower body clothing?: A Lot ?6 Click Score: 15 ? ?  ?End of Session   ? ?OT Visit Diagnosis: Other abnormalities of gait and mobility (R26.89);Muscle weakness (generalized) (M62.81);Pain ?Pain - Right/Left: Right ?Pain - part of body: Arm;Leg;Knee;Ankle and joints of foot ?  ?Activity Tolerance Patient tolerated treatment  well ?  ?Patient Left in chair;with call bell/phone within reach;with chair alarm set ?  ?Nurse Communication Mobility status;Precautions;Weight bearing status;Patient requests pain meds ?  ? ?   ? ?Time: 1517-6160 ?OT Time Calculation (min): 20 min ? ?Charges: OT General Charges ?$OT Visit: 1 Visit ?OT Treatments ?$Self Care/Home Management : 8-22 mins ? ?Lynnda Child, OTD, OTR/L ?Acute Rehab ?[phone] ? ? ?Kaylyn Lim ?11/25/2021, 4:44 PM ?

## 2021-11-25 NOTE — Progress Notes (Signed)
Hypoglycemic Event ? ?CBG: 61 (0412 hrs) ? ?Treatment: 1 tube glucose gel ? ?Symptoms: None ? ?Follow-up CBG: Time: 7858 CBG Result: 56 ? ?Treatment: 1 tube glucose gel ? ?Follow-up CBG: Time: 0509 hrs CBG Result: 72 ? ?Symptoms: None ? ?Possible Reasons for Event: Unknown ? ?Comments/MD notified: Clarene Essex, NP ? ? ? ?[place] ? ? ?

## 2021-11-25 NOTE — Progress Notes (Signed)
Physical Therapy Treatment ?Patient Details ?Name: Blake Santana ?MRN: [id number] ?DOB: Jun 14, 1971 ?Today's Date: 11/25/2021 ? ? ?History of Present Illness Pt is a 51 y.o. male admitted 11/17/21 as level 2 trauma after ~20 ft fall from ladder while cutting tree branches. Pt sustained L elbow dislocation s/p reduction, L pilon fx, R tibial plateau fx, R 5th toe fx, pubic ramus fx. S/p L pilon ORIF, R tibial ORIF, L distal radius ORIF, closed tx L elbow dislocation. PMH includes HTN, CHF, ESRD (HD TTS), DM, cataracts. ?  ?PT Comments  ? ? Pt progressing with mobility. Pt tolerating maximove transfer from bed to recliner without issue. Performed BLE therex/AAROM seated in recliner. Further discussion regarding safe d/c planning, including mobility and DME needs at home. Pt and wife concerned with transportation to HD; informed CM/SW working on this. Will continue to follow acutely to address established goals.   ?   ?Recommendations for follow up therapy are one component of a multi-disciplinary discharge planning process, led by the attending physician.  Recommendations may be updated based on patient status, additional functional criteria and insurance authorization. ? ?Follow Up Recommendations ? Home health PT ?  ?  ?Assistance Recommended at Discharge Intermittent Supervision/Assistance  ?Patient can return home with the following A lot of help with walking and/or transfers;A lot of help with bathing/dressing/bathroom;Assistance with cooking/housework;Assist for transportation;Help with stairs or ramp for entrance ?  ?Equipment Recommendations ? BSC/3in1;Wheelchair (measurements PT);Wheelchair cushion (measurements PT);Hoyer lift)  ?  ?Recommendations for Other Services   ? ? ?  ?Precautions / Restrictions Precautions ?Precautions: Fall ?Required Braces or Orthoses: Splint/Cast;Sling ?Splint/Cast: LUE sling, L ankle cast ?Restrictions ?Weight Bearing Restrictions: Yes ?LUE Weight Bearing: Non weight  bearing ?RLE Weight Bearing: Non weight bearing ?LLE Weight Bearing: Non weight bearing ?Other Position/Activity Restrictions: Ortho PA verbal clarification - unrestricted AROM L shoulder, L knee/hip, R ankle/knee/hip; pt NOT to WB through L elbow  ?  ? ?Mobility ? Bed Mobility ?Overal bed mobility: Needs Assistance ?  ?  ?  ?  ?  ?  ?General bed mobility comments: able to come to long sitting and reach feet with use of bed rail to pull trunk forwards from elevated HOB ?  ? ?Transfers ?Overall transfer level: Needs assistance ?  ?Transfers: Bed to chair/wheelchair/BSC ?  ?  ?  ?  ?  ?  ?General transfer comment: maximove lift from bed to recliner, pt tolerated well although c/o R knee pain with flexion ?Transfer via Lift Equipment: [place] ? ?Ambulation/Gait ?  ?  ?  ?  ?  ?  ?  ?  ? ? ?Stairs ?  ?  ?  ?  ?  ? ? ?Wheelchair Mobility ?  ? ?Modified Rankin (Stroke Patients Only) ?  ? ? ?  ?Balance Overall balance assessment: Needs assistance ?Sitting-balance support: No upper extremity supported, Feet unsupported ?Sitting balance-Leahy Scale: Fair ?  ?  ?  ?  ?  ?  ?  ?  ?  ?  ?  ?  ?  ?  ?  ?  ?  ? ?  ?Cognition Arousal/Alertness: Awake/alert ?Behavior During Therapy: [facility] for tasks assessed/performed ?Overall Cognitive Status: Within Functional Limits for tasks assessed ?  ?  ?  ?  ?  ?  ?  ?  ?  ?  ?  ?  ?  ?  ?  ?  ?General Comments: via in-person Spanish interpreter ?  ?  ? ?  ?Exercises General  Exercises - Lower Extremity ?Ankle Circles/Pumps: AROM, Right, Seated ?Quad Sets: AROM, Both, Seated ?Gluteal Sets: AROM, Both, Seated ?Long Arc Quad: AAROM, Right, Seated ?Straight Leg Raises: AAROM, Both, Seated ?Other Exercises ?Other Exercises: R calf stretch w/ strap, R heel slide w/ strap (partial range, painful), improving ability to perform R ankle pumps; isometric adductor squeeze ? ?  ?General Comments General comments (skin integrity, edema, etc.): further discussion regarding mobility and DME needs upon return  home; wife concerned pt has not had BM since PTA 8 days ago (RN notified); wife also concerned about transport to/from HD, informed them SW/CM working on this ?  ?  ? ?Pertinent Vitals/Pain Pain Assessment ?Pain Assessment: Faces ?Faces Pain Scale: Hurts even more ?Pain Location: R knee > R hip, LLE, LUE ?Pain Descriptors / Indicators: Discomfort, Grimacing, Guarding, Sore ?Pain Intervention(s): Monitored during session, Limited activity within patient's tolerance, Repositioned, Premedicated before session  ? ? ?Home Living   ?  ?  ?  ?  ?  ?  ?  ?  ?  ?   ?  ?Prior Function    ?  ?  ?   ? ?PT Goals (current goals can now be found in the care plan section) Progress towards PT goals: Progressing toward goals ? ?  ?Frequency ? ? ? Min 5X/week ? ? ? ?  ?PT Plan Current plan remains appropriate  ? ? ?Co-evaluation   ?  ?  ?  ?  ? ?  ?AM-PAC PT "6 Clicks" Mobility   ?Outcome Measure ? Help needed turning from your back to your side while in a flat bed without using bedrails?: Total ?Help needed moving from lying on your back to sitting on the side of a flat bed without using bedrails?: Total ?Help needed moving to and from a bed to a chair (including a wheelchair)?: Total ?Help needed standing up from a chair using your arms (e.g., wheelchair or bedside chair)?: Total ?Help needed to walk in hospital room?: Total ?Help needed climbing 3-5 steps with a railing? : Total ?6 Click Score: 6 ? ?  ?End of Session   ?Activity Tolerance: Patient tolerated treatment well ?Patient left: in chair;with call bell/phone within reach;with chair alarm set;with family/visitor present ?Nurse Communication: Mobility status;Need for lift equipment ?PT Visit Diagnosis: Other abnormalities of gait and mobility (R26.89);Pain ?  ? ? ?Time: 6283-6629 ?PT Time Calculation (min) (ACUTE ONLY): 35 min ? ?Charges:  $Therapeutic Exercise: 8-22 mins ?$Therapeutic Activity: 8-22 mins          ?          ? ?Mabeline Caras, PT, DPT ?Acute Rehabilitation  Services  ?Pager [phone] ?Office [phone] ? ?Derry Lory ?11/25/2021, 6:07 PM ? ?

## 2021-11-26 LAB — GLUCOSE, CAPILLARY
Glucose-Capillary: 111 mg/dL — ABNORMAL HIGH (ref 70–99)
Glucose-Capillary: 106 mg/dL — ABNORMAL HIGH (ref 70–99)
Glucose-Capillary: 142 mg/dL — ABNORMAL HIGH (ref 70–99)
Glucose-Capillary: 128 mg/dL — ABNORMAL HIGH (ref 70–99)
Glucose-Capillary: 137 mg/dL — ABNORMAL HIGH (ref 70–99)

## 2021-11-26 NOTE — Plan of Care (Signed)
  Problem: Education: Goal: Knowledge of General Education information will improve Description: Including pain rating scale, medication(s)/side effects and non-pharmacologic comfort measures Outcome: Not Progressing   Problem: Health Behavior/Discharge Planning: Goal: Ability to manage health-related needs will improve Outcome: Not Progressing   Problem: Clinical Measurements: Goal: Ability to maintain clinical measurements within normal limits will improve Outcome: Not Progressing Goal: Will remain free from infection Outcome: Not Progressing Goal: Diagnostic test results will improve Outcome: Not Progressing Goal: Respiratory complications will improve Outcome: Not Progressing Goal: Cardiovascular complication will be avoided Outcome: Not Progressing   Problem: Activity: Goal: Risk for activity intolerance will decrease Outcome: Not Progressing   Problem: Nutrition: Goal: Adequate nutrition will be maintained Outcome: Not Progressing   Problem: Elimination: Goal: Will not experience complications related to bowel motility Outcome: Not Progressing Goal: Will not experience complications related to urinary retention Outcome: Not Progressing   Problem: Pain Managment: Goal: General experience of comfort will improve Outcome: Not Progressing   Problem: Safety: Goal: Ability to remain free from injury will improve Outcome: Not Progressing   Problem: Skin Integrity: Goal: Risk for impaired skin integrity will decrease Outcome: Not Progressing   

## 2021-11-26 NOTE — TOC Progression Note (Signed)
Transition of Care (TOC) - Progression Note  ? ? ?Patient Details  ?Name: Blake Santana ?MRN: [id number] ?Date of Birth: [birth year] ? ?Transition of Care (TOC) CM/SW Contact  ?Sharin Mons, RN ?Phone Number: ?11/26/2021, 12:01 PM ? ?Clinical Narrative:    ?Per MD pt will be d/c ready after HD on tomorrow. Approval received for [facility] for transportation to and from HD sessions.Pt/ family will need to call reservation office with [facility] @ [phone] to arrange pt pick up time . ?Referral made with [facility] for home health PT/OT and SW services, charity care, acceptance pending. ?  ?DME ( w/c, BSC, hoyer lift) delivered to pt home,4/25 by [facility]. Wife communicated they don't know how to use equipment . NCM  shared information with [employer] and a service ticket processed  for [employer] to go back and provide teaching. ? ?MD to send Rx Meds to [facility] prior to d/c. Once filled TOC pharmacy will deliver to pt's bedside. ? ?Pt will need non emergent ambulance for transportation to home. NCM will arrange with [facility]. ? ?TOC team will continue to monitor and assist with needs. ? ?Expected Discharge Plan: [place] ?Barriers to Discharge: Continued Medical Work up ? ?Expected Discharge Plan and Services ?Expected Discharge Plan: [place] ?In-house Referral: Clinical Social Work, Counselling psychologist ?Discharge Planning Services: CM Consult ?Post Acute Care Choice: Home Health, Durable Medical Equipment ?Living arrangements for the past 2 months: [place] ?                ?DME Arranged: 3-N-1, Wheelchair manual (hoyer lift) ?DME Agency: [employer] ?Date DME Agency Contacted: 11/24/21 ?Time DME Agency Contacted: [phone] ?Representative spoke with at DME Agency: Delana Meyer ?HH Arranged: PT, OT, Social Work [employer] care) ?[place] Agency: [place] [facility]) ?Date HH Agency Contacted: 11/26/21 ?Time [place]:  1200 ?Representative spoke with at [place]: [place] ? ? ?Social Determinants of Health (SDOH) Interventions ?  ? ?Readmission Risk Interventions ?   ? View : No data to display.  ?  ?  ?  ? ? ?

## 2021-11-26 NOTE — Progress Notes (Signed)
?PROGRESS NOTE ? ? ? ?Blake Santana  EHM:[id number] DOB: 11-21-1970 DOA: 11/17/2021 ?PCP: [name], NP  ? ? ?Brief Narrative:  ? ?Blake Santana is a 51 year old male with past medical history significant for ESRD on HD TTS, type 2 diabetes mellitus with diabetic retinopathy, anemia of chronic renal disease, essential hypertension, hyperlipidemia, PAD who presented to [facility] ED on 4/17 for trauma evaluation after he fell from a 20 foot height. Patient states that he was cutting tree branches while standing on a ladder at his house when a branch hit him in the chest and he fell about 20 feet.  EMS were called and per ED documentation he had notable deformity to his left shoulder and left ankle.  He was placed on c-collar and given 100 mcg of fentanyl then brought to the ED for further evaluation. ? ?In the ED, BP 222/118, HR 85, RR 18, temp 97.8 ?F, SPO2 100% on 3 L O2 via [place]. Sodium 136, potassium 4.8, bicarb 24, BUN 75, creatinine 10.78, serum glucose 229, AST 19, ALT 16, alk phos 289, total bilirubin 0.7, WBC 12.2, hemoglobin 10.7, platelets 172,000, serum ethanol <10, lactic acid 1.2, INR 1.1, troponin 30. SARS-CoV-2 and influenza PCR negative. ?  ?Extensive traumatic imaging notable for: ?Acute comminuted left inferior pubic ramus fracture ?Acute comminuted fracture of the left distal fibula and tibia with extension to the medial malleolus; possible left calcaneal fracture ?Acute comminuted fracture left distal radius ?Mildly displaced fracture of the base of proximal phalanx of right fifth toe ?Possible proximal right tibial fracture ?Posterior dislocation of the left elbow ?Bilateral lung atelectasis ?Negative for acute fracture or traumatic subluxation of the C-spine ?Negative for acute intracranial abnormality ?  ?Patient was given 1 L LR.  Left elbow dislocation was reduced under sedation in the ED, follow-up x-ray shows anatomic alignment on single view.  EDP discussed with trauma  surgery and orthopedics.  Medical admission was requested.  Orthopedics will see in consultation for possible surgical intervention.  The hospitalist service was consulted to admit for further evaluation and management. ? ?Assessment & Plan: ?  ?Closed fracture of inferior pubic ramus, left,  ?Acute comminuted fracture of the left distal fibula and tibia with extension to the medial malleolus; possible left calcaneal fracture ?Acute comminuted fracture left distal radius ?Mildly displaced fracture of the base of proximal phalanx of right fifth toe ?Possible proximal right tibial fracture ?Posterior dislocation of the left elbow, s/p reduction in the ED ?Traumatic fall from 20 foot height ?Patient presenting to ED following 20 foot fall from a tree with multiple fractures.  Orthopedics was consulted and patient underwent ORIF right tibial plateau fracture, ORIF left ankle pilon fracture, ORIF left distal radial fracture and nonoperative management of left inferior pubis ramus fracture by Dr. Doreatha Martin on 11/20/2021.   ?Weightbearing:  ?- RUE: WBAT ?- LUE: NWB ?- RLE: NWB ?- LLE: NWB ?ROM:  ?- RUE: Unrestricted ROM ?- LUE: Okay for shoulder motion as tolerated.  Maintain long-arm splint ?- RLE: Unrestricted hip, knee, ankle ROM as tolerated ?- LLE: Maintain short leg splint.  Okay for unrestricted knee and hip ROM as tolerated ?Incisional and dressing care:   ?- LUE: Maintain splint.  Okay to make a window in Ace wrap for hemodialysis access ?- RLE: Dressing changed today.  Continue to change Mepilex as needed.  Okay to leave open to air if no drainage. ?- LLE: Maintain short leg splint, keep clean and dry ?Showering: Okay to shower, keep  LUE and LLE splint clean and dry.  RLE incisions may get wet ?Orthopedic device(s): Splint LUE, LLE ?--Pain control with Tylenol/oxycodone, Robaxin as needed ?--Continue PT/OT efforts while inpatient with planned home health on discharge ?--Likely to remain nonweightbearing status as  above for at least 6 weeks ?--Outpatient follow-up with Dr. Doreatha Martin 2 weeks ? ?ESRD on HD ?--Continues on TTS schedule ?--Nephrology following for continued hemodialysis while inpatient ? ?Type 2 diabetes mellitus ?On glipizide at home which was discontinued due to multiple episodes of hypoglycemia.  Hemoglobin A1c 7.0, well controlled. ?--SSI for coverage ?--CBG before every meal/at bedtime ? ?Essential hypertension ?--Amlodipine 10 mg p.o. daily ?--Metoprolol tartrate 12.5 mg p.o. twice daily ?--Hydralazine 25 mg p.o. every 8 hours as needed SBP >170 or DBP >90 ? ?Anemia of chronic creatinine disease ?Patient transfused 2 units PVCs during hospitalization.  Likely complicated by postoperative anemia with multiple fractures. ?--Continue intermittent monitoring of hemoglobin ? ?Hyperlipidemia ?--Crestor 20 mg p.o. daily ? ?DVT prophylaxis: heparin injection 5,000 Units Start: 11/20/21 2215 ?SCDs Start: 11/20/21 2108 ?SCDs Start: 11/17/21 2158 ? ?  Code Status: Full Code ?Family Communication: No family present at bedside this morning ? ?Disposition Plan:  ?Level of care: Med-Surg ?Status is: Inpatient ?Remains inpatient appropriate because: Awaiting home equipment arrival and family training, also awaiting transportation services to be arranged by case management in order patient to get to his dialysis appointments ?  ? ?Consultants:  ?Orthopedics, Dr. Doreatha Martin ?Nephrology ? ?Procedures:  ?ORIF RIGHT TIBIAL PLATEAU ?ORIF LEFT ANKLE PILON FRACTURE ?ORIF LEFT DISTAL RADIAL FRACTURE ?NON-OPERATIVE MANAGEMENT LEFT INFERIOR PUBIC RAMUS FRACTURE ? ?Antimicrobials:  ?Perioperative vancomycin and cefazolin ? ? ? ?Subjective: ?Patient seen examined bedside, resting comfortably.  Assisted with interpretation by video interpreter Tania ID# [id number].  Nephrology, Dr. Carolin Sicks at bedside.  Discussed with patient awaiting for equipment training with family by device representatives as well as transportation to be arranged in order to  get to dialysis.  Reports pain controlled.  No specific complaints or concerns this morning.  Denies headache, no visual changes, no chest pain, no palpitations, no shortness of breath, no abdominal pain, no weakness, no fatigue, no paresthesias.  No acute events overnight per nursing staff. ? ?Objective: ?Vitals:  ? 11/26/21 0433 11/26/21 0713 11/26/21 0732 11/26/21 1503  ?BP: (!) 125/55  (!) 139/41 (!) 152/69  ?Pulse: 68  68 70  ?Resp: '18  17 17  ' ?Temp: 97.7 ?F (36.5 ?C)  98.7 ?F (37.1 ?C) 98.4 ?F (36.9 ?C)  ?TempSrc:   Oral Oral  ?SpO2: (!) 89% 98% 96% 94%  ?Weight:      ?Height:      ? ? ?Intake/Output Summary (Last 24 hours) at 11/26/2021 1514 ?Last data filed at 11/25/2021 2138 ?Gross per 24 hour  ?Intake 240 ml  ?Output --  ?Net 240 ml  ? ?Filed Weights  ? 11/22/21 0730 11/22/21 1139 11/25/21 0832  ?Weight: 71 kg 69 kg 69.4 kg  ? ? ?Examination: ? ?Physical Exam: ?GEN: NAD, alert and oriented x 3, wd/wn ?HEENT: NCAT, PERRL, EOMI, sclera clear, MMM ?PULM: CTAB w/o wheezes/crackles, normal respiratory effort on room air ?CV: RRR w/o M/G/R ?GI: abd soft, NTND, NABS, no R/G/M ?MSK: no peripheral edema, moves all extremities independently, noted surgical dressing/splint to left upper extremity, right lower extremity, left lower extremity clean/dry/intact, neurovascular intact ?NEURO: CN II-XII intact, no focal deficits, sensation to light touch intact ?PSYCH: normal mood/affect ?Integumentary: dry/intact, no rashes or wounds ? ? ? ?Data Reviewed: I have personally  reviewed following labs and imaging studies ? ?CBC: ?Recent Labs  ?Lab 11/20/21 ?6691 11/20/21 ?6756 11/20/21 ?1418 11/22/21 ?0146 11/24/21 ?1827  ?WBC  --  9.3  --  9.7 9.7  ?NEUTROABS  --  6.8  --   --  6.7  ?HGB 8.5* 8.2* 11.2* 9.7* 10.0*  ?HCT 25.7* 25.4* 33.0* 28.9* 30.6*  ?MCV  --  95.1  --  91.7 95.0  ?PLT  --  145*  --  163 251  ? ?Basic Metabolic Panel: ?Recent Labs  ?Lab 11/20/21 ?1254 11/20/21 ?1418 11/22/21 ?0146 11/24/21 ?1853  ?NA 137 134*  132* 131*  ?K 5.8* 3.9 4.2 4.9  ?CL 94* 96* 92* 88*  ?CO2 23  --  23 26  ?GLUCOSE 145* 110* 193* 111*  ?BUN 94* 26* 78* 91*  ?CREATININE 13.24* 5.80* 9.76* 10.47*  ?CALCIUM 9.4  --  9.8 9.4  ?PHOS 9.1*  --

## 2021-11-26 NOTE — Progress Notes (Signed)
Pt's chart reviewed and noted pt for likely d/c tomorrow. Pt to return home with family at d/c. RN CM note indicates pt approved for [facility] and family to receive education on home DME. Met with pt and pt's family at bedside with Spero Geralds, on-site [place] interpreter. Explained to pt and family that pt will need to go to HD with hoyer pad/sling under pt so clinic hoyer lift can be used to transfer pt from w/c to HD chair. Family voices understanding. Inquired if pt's family had access to [place] phone number to call for transportation appts. Phone number (per RN CM note)  provided to family this afternoon so family can call to schedule appt for Saturday. Confirmed with family that pt's days/times have not changed and remain the same as prior to admission. Contacted [facility] and spoke to [place]. Clinic advised pt should d/c tomorrow once all d/c plans are confirmed and in place. Clinic aware pt should resume on Saturday and will be using transportation with [facility] and will need hoyer lift transfer. Will assist as needed.  ? ?Melven Sartorius ?Renal Navigator ?[phone] ?

## 2021-11-26 NOTE — Progress Notes (Signed)
Physical Therapy Treatment ?Patient Details ?Name: Blake Santana ?MRN: [id number] ?DOB: 1971-01-13 ?Today's Date: 11/26/2021 ? ? ?History of Present Illness Pt is a 51 y.o. male admitted 11/17/21 as level 2 trauma after ~20 ft fall from ladder while cutting tree branches. Pt sustained L elbow dislocation s/p reduction, L pilon fx, R tibial plateau fx, R 5th toe fx, pubic ramus fx. S/p L pilon ORIF, R tibial ORIF, L distal radius ORIF, closed tx L elbow dislocation. PMH includes HTN, CHF, ESRD (HD TTS), DM, cataracts. ?  ?PT Comments  ? ? Pt progressing with mobility. Today's session focused on transfer and wheelchair training with pt and family (wife, stepson); pt's family actively engaged, although understandably overwhelmed by complicated situation; pt in good spirits. Reviewed education. Noted plan for HD tomorrow then likely d/c home. If to remain admitted, will continue to follow acutely. ?   ?Recommendations for follow up therapy are one component of a multi-disciplinary discharge planning process, led by the attending physician.  Recommendations may be updated based on patient status, additional functional criteria and insurance authorization. ? ?Follow Up Recommendations ? Home health PT ?  ?  ?Assistance Recommended at Discharge Intermittent Supervision/Assistance  ?Patient can return home with the following A lot of help with walking and/or transfers;A lot of help with bathing/dressing/bathroom;Assistance with cooking/housework;Assist for transportation;Help with stairs or ramp for entrance ?  ?Equipment Recommendations ? BSC/3in1;Wheelchair (measurements PT);Wheelchair cushion (measurements PT);Other (comment) (hoyer lift)  ?  ?Recommendations for Other Services   ? ? ?  ?Precautions / Restrictions Precautions ?Precautions: Fall ?Required Braces or Orthoses: Splint/Cast;Sling ?Splint/Cast: LUE sling, L ankle cast ?Restrictions ?Weight Bearing Restrictions: Yes ?LUE Weight Bearing: Non weight  bearing ?RLE Weight Bearing: Non weight bearing ?LLE Weight Bearing: Non weight bearing ?Other Position/Activity Restrictions: Ortho PA verbal clarification - unrestricted AROM L shoulder, L knee/hip, R ankle/knee/hip; pt NOT to WB through L elbow  ?  ? ?Mobility ? Bed Mobility ?Overal bed mobility: Needs Assistance ?Bed Mobility: Supine to Sit ?  ?  ?Supine to sit: Min assist, HOB elevated ?  ?  ?General bed mobility comments: able to come to long sitting from elevated HOB to initiate lateral scoot ?  ? ?Transfers ?Overall transfer level: Needs assistance ?  ?Transfers: Bed to chair/wheelchair/BSC ?  ?  ?  ?  ?  ? Lateral/Scoot Transfers: Max assist, +2 physical assistance, +2 safety/equipment ?General transfer comment: pt adamant about wanting to try lateral scoot transfer from bed to w/c - totalA for w/c transfer set-up with family (son, wife) present to observe; pt requiring max-totalA of wife and son to hold BLEs, maxA from therapist to scoot hips with bed pad, pt able to minimally assist with RUE and cues to maintain LUE NWB; pt ultimately requiring maxA to complete transfer with RUE wrapped around therapist's neck and bed pad to lift hips over to w/c. pt reports understanding this is not safe transfer technique for family, will need to use hoyer lift ?  ? ?Ambulation/Gait ?  ?  ?  ?  ?  ?  ?  ?  ? ? ?Stairs ?  ?  ?  ?  ?  ? ? ?Wheelchair Mobility ?  ? ?Modified Rankin (Stroke Patients Only) ?  ? ? ?  ?Balance Overall balance assessment: Needs assistance ?Sitting-balance support: No upper extremity supported, Feet unsupported ?Sitting balance-Leahy Scale: Fair ?Sitting balance - Comments: can sit with back unsupported in w/c ?  ?  ?  ?  ?  ?  ?  ?  ?  ?  ?  ?  ?  ?  ?  ?  ? ?  ?  Cognition Arousal/Alertness: Awake/alert ?Behavior During Therapy: [facility] for tasks assessed/performed ?Overall Cognitive Status: Within Functional Limits for tasks assessed ?  ?  ?  ?  ?  ?  ?  ?  ?  ?  ?  ?  ?  ?  ?  ?  ?General  Comments: via in-person Spanish interpreter ?  ?  ? ?  ?Exercises Other Exercises ?Other Exercises: Pt remains hesitant to let bilateral knees flex at edge of w/c requiring encouragement to do so, ultimately able to tolerate 90' flexion (gravity-assisted) while seated ? ?  ?General Comments General comments (skin integrity, edema, etc.): Pt's wife and stepson present during session, they will be primary family assisting pt upon return home. Wife brought pt's hoyer lift pad and w/c for education. Pt's w/c adjusted and family/pt educated on its use; also educ on hoyer lift pad placement (i.e. rolling or from seated position) and use; informed family hospital maximove lift does not equate to their hoyer lift, plan for DME vendor to return to pt's house to educate family on its use. Wife still concerned regarding HD transport; informed her CM still working on this. ?  ?  ? ?Pertinent Vitals/Pain Pain Assessment ?Pain Assessment: Faces ?Faces Pain Scale: Hurts even more ?Pain Location: R knee > R hip, LLE, LUE ?Pain Descriptors / Indicators: Discomfort, Grimacing, Guarding, Sore ?Pain Intervention(s): Monitored during session, Limited activity within patient's tolerance, Repositioned  ? ? ?Home Living   ?  ?  ?  ?  ?  ?  ?  ?  ?  ?   ?  ?Prior Function    ?  ?  ?   ? ?PT Goals (current goals can now be found in the care plan section) Progress towards PT goals: Progressing toward goals ? ?  ?Frequency ? ? ? Min 5X/week ? ? ? ?  ?PT Plan Current plan remains appropriate  ? ? ?Co-evaluation   ?  ?  ?  ?  ? ?  ?AM-PAC PT "6 Clicks" Mobility   ?Outcome Measure ? Help needed turning from your back to your side while in a flat bed without using bedrails?: A Lot ?Help needed moving from lying on your back to sitting on the side of a flat bed without using bedrails?: Total ?Help needed moving to and from a bed to a chair (including a wheelchair)?: Total ?Help needed standing up from a chair using your arms (e.g., wheelchair or  bedside chair)?: Total ?Help needed to walk in hospital room?: Total ?Help needed climbing 3-5 steps with a railing? : Total ?6 Click Score: 7 ? ?  ?End of Session   ?Activity Tolerance: Patient tolerated treatment well ?Patient left: with call bell/phone within reach;with family/visitor present;Other (comment) (in wheelchair) ?Nurse Communication: Mobility status;Need for lift equipment ?PT Visit Diagnosis: Other abnormalities of gait and mobility (R26.89);Pain ?  ? ? ?Time: 1005-1040 ?PT Time Calculation (min) (ACUTE ONLY): 35 min ? ?Charges:  $Therapeutic Activity: 8-22 mins ?$Wheel Chair Management: 8-22 mins          ?          ? ?Mabeline Caras, PT, DPT ?Acute Rehabilitation Services  ?Pager [phone] ?Office [phone] ? ?Derry Lory ?11/26/2021, 1:04 PM ? ?

## 2021-11-26 NOTE — Progress Notes (Addendum)
?[facility] ?Progress Note  ? ?Subjective:  Seen in room - no CP/dyspnea. Complaining about his breakfast. ? ?Objective ?Vitals:  ? 11/25/21 1953 11/25/21 2137 11/26/21 0433 11/26/21 0732  ?BP: 134/60 135/64 (!) 125/55 (!) 139/41  ?Pulse: 67  68 68  ?Resp: '17  18 17  '$ ?Temp: 98 ?F (36.7 ?C)  97.7 ?F (36.5 ?C) 98.7 ?F (37.1 ?C)  ?TempSrc:    Oral  ?SpO2: 95%  (!) 89% 96%  ?Weight:      ?Height:      ? ?Physical Exam ?General: Well appearing man, NAD. Room air ?Heart: RRR; no murmur ?Lungs: CTAB ?Abdomen: soft ?Extremities: BLE in casts, no thigh edema, L forearm in cast ?Dialysis Access: LUE AVF + bruit ? ?Additional Objective ?Labs: ?Basic Metabolic Panel: ?Recent Labs  ?Lab 11/20/21 ?0174 11/20/21 ?1418 11/22/21 ?0146 11/24/21 ?1853  ?NA 137 134* 132* 131*  ?K 5.8* 3.9 4.2 4.9  ?CL 94* 96* 92* 88*  ?CO2 23  --  23 26  ?GLUCOSE 145* 110* 193* 111*  ?BUN 94* 26* 78* 91*  ?CREATININE 13.24* 5.80* 9.76* 10.47*  ?CALCIUM 9.4  --  9.8 9.4  ?PHOS 9.1*  --   --  6.5*  ? ?Liver Function Tests: ?Recent Labs  ?Lab 11/20/21 ?9449 11/24/21 ?1853  ?ALBUMIN 3.1* 2.7*  ? ?CBC: ?Recent Labs  ?Lab 11/19/21 ?1250 11/20/21 ?6759 11/20/21 ?1638 11/20/21 ?1418 11/22/21 ?0146 11/24/21 ?1827  ?WBC 10.1  --  9.3  --  9.7 9.7  ?NEUTROABS 7.3  --  6.8  --   --  6.7  ?HGB 7.8*   < > 8.2* 11.2* 9.7* 10.0*  ?HCT 24.3*   < > 25.4* 33.0* 28.9* 30.6*  ?MCV 98.8  --  95.1  --  91.7 95.0  ?PLT 138*  --  145*  --  163 251  ? < > = values in this interval not displayed.  ? ?Medications: ? methocarbamol (ROBAXIN) IV    ? sodium chloride    ? ? (feeding supplement) PROSource Plus  30 mL Oral BID BM  ? sodium chloride   Intravenous Once  ? sodium chloride   Intravenous Once  ? amLODipine  10 mg Oral Daily  ? Chlorhexidine Gluconate Cloth  6 each Topical Q0600  ? cholecalciferol  2,000 Units Oral Daily  ? cinacalcet  60 mg Oral Q supper  ? darbepoetin (ARANESP) injection - DIALYSIS  60 mcg Intravenous Q Thu-HD  ? docusate sodium  100 mg  Oral BID  ? doxercalciferol  1 mcg Intravenous Q T,Th,Sa-HD  ? heparin injection (subcutaneous)  5,000 Units Subcutaneous Q8H  ? insulin aspart  0-6 Units Subcutaneous TID WC  ? lanthanum  2,000 mg Oral TID WC  ? mouth rinse  15 mL Mouth Rinse BID  ? metoprolol tartrate  12.5 mg Oral BID  ? rosuvastatin  20 mg Oral Daily  ? ? ?Dialysis Orders: ?TTS at [facility] ?3:45hr, 400/800, EDW 63.5kg, 2K/2Ca, LUE AVF, no heparin ?- No ESA/iron, last Hgb 11 ?- Hectoral 7mg IV q HD ?  ?Assessment/Plan: ?1. Fall from tree/multiple traumatic limb fractures: Ortho following, surgery 11/20/21, per notes: closed tx L elbow dislocation and ORIF to R tibial, L ankle, L wrist.   Nurse had to upwrap upper LUE dressing and cut portion of cast to expose fistula for cannulation to facilitate dialysis.  L elbow/wrist remains stabilized. ?2. ESRD: Continue HD per usual TTS schedule - next tomorrow.   ?3. HTN/volume: BP elevated today.  Home amlodipine/metoprolol restarted.  Does not appear volume overloaded, lungs clear. Hard to determine weights with multiple casts. UF as tolerated.  ?4. Anemia of ESRD: Hgb 9.7 today. S/p 2 units pRBC this admit.  Aranesp '60mg'$  qThurs. Follow labs.  ?5. Secondary hyperparathyroidism: Ca ok, Phos high - resume home binders (fosrenol) and sensipar. ?6. Nutrition: Continue supplement for wound healing.  Renal diet w/fluid restrictions.  ?7. T2DM ?8. HL  ?9. Dispo: Looks like plan is home with home health now. ? ?Veneta Penton, PA-C ?11/26/2021, 8:59 AM  ?[facility] ? ?Nephrology attending: ?I have personally seen and examined the patient.  Chart reviewed.  I agree with above. ? ?Spanish translator # [phone] ?He denies pain, chest pain, shortness of breath.  He reports feeling good.  It seems like he is now awaiting to arrange transportation to the dialysis center and safe discharge planning.  He had dialysis yesterday with 2.6 L ultrafiltration and tolerated well.  Plan for regular HD tomorrow and  possibly discharge afterward.  Continue ESA, Fosrenol and monitor labs.  I have discussed with the primary team as well. ? ?D.  Carolin Sicks, MD ?[facility]. ? ? ? ?

## 2021-11-26 NOTE — Progress Notes (Signed)
Occupational Therapy Treatment ?Patient Details ?Name: Blake Santana ?MRN: [id number] ?DOB: [birth year] ?Today's Date: 11/26/2021 ? ? ?History of present illness Pt is a 51 y.o. male admitted 11/17/21 as level 2 trauma after ~20 ft fall from ladder while cutting tree branches. Pt sustained L elbow dislocation s/p reduction, L pilon fx, R tibial plateau fx, R 5th toe fx, pubic ramus fx. S/p L pilon ORIF, R tibial ORIF, L distal radius ORIF, closed tx L elbow dislocation. PMH includes HTN, CHF, ESRD (HD TTS), DM, cataracts. ?  ?OT comments ? Pt seen for OT session, focus on LB dressing and A <> P transfers to [facility]. Pt's wife and stepson in room to provide assist, educated on proper hand placement/techniques for transfers with +3 assist. Also briefly discussed use of hoyer lift for transfers. Pt able to complete LB dressing at bed level with mod A, with cues to adhere to WB precautions. Answered all questions from pt's wife and stepson. Pt presenting with impairments listed below, will follow acutely. Continue to recommend HHOT at d/c.  ? ?Recommendations for follow up therapy are one component of a multi-disciplinary discharge planning process, led by the attending physician.  Recommendations may be updated based on patient status, additional functional criteria and insurance authorization. ?   ?Follow Up Recommendations ? Home health OT  ?  ?Assistance Recommended at Discharge Frequent or constant Supervision/Assistance  ?Patient can return home with the following ? Two people to help with walking and/or transfers;Two people to help with bathing/dressing/bathroom;Assist for transportation;Help with stairs or ramp for entrance;Assistance with cooking/housework ?  ?Equipment Recommendations ? BSC/3in1;Wheelchair (measurements OT);Wheelchair cushion (measurements OT);Hospital bed;Other (comment)  ?  ?Recommendations for Other Services   ? ?  ?Precautions / Restrictions Precautions ?Precautions: Fall ?Required  Braces or Orthoses: Splint/Cast;Sling ?Splint/Cast: LUE sling, L ankle cast ?Restrictions ?Weight Bearing Restrictions: Yes ?LUE Weight Bearing: Non weight bearing ?RLE Weight Bearing: Non weight bearing ?LLE Weight Bearing: Non weight bearing ?Other Position/Activity Restrictions: Ortho PA verbal clarification - unrestricted AROM L shoulder, L knee/hip, R ankle/knee/hip; pt NOT to WB through L elbow  ? ? ?  ? ?Mobility Bed Mobility ?Overal bed mobility: Needs Assistance ?Bed Mobility: Supine to Sit ?  ?  ?Supine to sit: Min assist, HOB elevated ?Sit to supine: Max assist, +2 for physical assistance ?  ?General bed mobility comments: able to come to long sitting from elevated HOB to initiate A <> P transfer ?  ? ?Transfers ?Overall transfer level: Needs assistance ?  ?Transfers: Bed to chair/wheelchair/BSC ?  ?  ?  ?  ?Anterior-Posterior transfers: Max assist, +2 physical assistance ?  ?General transfer comment: +2-3 assist to anterior <> posterior transfer to [facility], cues to family for use of pad ?  ?  ?Balance Overall balance assessment: Needs assistance ?Sitting-balance support: No upper extremity supported, Feet unsupported ?Sitting balance-Leahy Scale: Fair ?Sitting balance - Comments: long sitting in bed, needs feet supported ?  ?  ?  ?  ?  ?  ?  ?  ?  ?  ?  ?  ?  ?  ?  ?   ? ?ADL either performed or assessed with clinical judgement  ? ?ADL Overall ADL's : Needs assistance/impaired ?  ?  ?  ?  ?  ?  ?  ?  ?  ?  ?Lower Body Dressing: Moderate assistance;Bed level ?Lower Body Dressing Details (indicate cue type and reason): rolling R/L cues to adhere to WB precautions as pt attempting to  bridge ?Toilet Transfer: Anterior/posterior;Maximal assistance;+2 for physical assistance ?Toilet Transfer Details (indicate cue type and reason): +2-3, educated pt/family on safety cues and hand positioning for A<> P transfer ?  ?  ?  ?  ?Functional mobility during ADLs: Maximal assistance;+2 for physical assistance ?  ?   ? ?Extremity/Trunk Assessment Upper Extremity Assessment ?Upper Extremity Assessment: RUE deficits/detail ?RUE Deficits / Details: s/p elbow dislocation reduction; functionally >4/5 strength throughout, denies pain with ROM ?LUE Deficits / Details: s/p L distal radius ORIF in cast; able to partially flex/ext fingers, noted swelling; L shoulder with good AAROM although painful, gross function at least 3/5 ?  ?Lower Extremity Assessment ?Lower Extremity Assessment: Defer to PT evaluation ?  ?  ?  ? ?Vision   ?Additional Comments: hx of diabetic retinopathy ?  ?Perception Perception ?Perception: Not tested ?  ?Praxis Praxis ?Praxis: Not tested ?  ? ?Cognition Arousal/Alertness: Awake/alert ?Behavior During Therapy: [facility] for tasks assessed/performed ?Overall Cognitive Status: Within Functional Limits for tasks assessed ?  ?  ?  ?  ?  ?  ?  ?  ?  ?  ?  ?  ?  ?  ?  ?  ?General Comments: via in-person Spanish interpreter - Graciela ?  ?  ?   ?Exercises   ? ?  ?Shoulder Instructions   ? ? ?  ?General Comments Pt's wife and stepson present and participating during session, reiterated NWB precautions, as well as handling techniques for tranfers  ? ? ?Pertinent Vitals/ Pain       Pain Assessment ?Pain Assessment: Faces ?Pain Score: 7  ?Faces Pain Scale: Hurts even more ?Pain Location: R knee > R hip, LLE, LUE ?Pain Descriptors / Indicators: Discomfort, Grimacing, Guarding, Sore ?Pain Intervention(s): Monitored during session, Limited activity within patient's tolerance, Premedicated before session ? ?Home Living   ?  ?  ?  ?  ?  ?  ?  ?  ?  ?  ?  ?  ?  ?  ?  ?  ?  ?  ? ?  ?Prior Functioning/Environment    ?  ?  ?  ?   ? ?Frequency ? Min 3X/week  ? ? ? ? ?  ?Progress Toward Goals ? ?OT Goals(current goals can now be found in the care plan section) ? Progress towards OT goals: Progressing toward goals ? ?Acute Rehab OT Goals ?Patient Stated Goal: none stated ?OT Goal Formulation: With patient/family ?Time For Goal Achievement:  12/04/21 ?Potential to Achieve Goals: Good ?ADL Goals ?Pt Will Perform Lower Body Bathing: with caregiver independent in assisting;with min assist;bed level ?Pt Will Perform Lower Body Dressing: bed level;with caregiver independent in assisting;with min assist ?Pt Will Transfer to Toilet: bedside commode;anterior/posterior transfer;with +2 assist;with mod assist ?Additional ADL Goal #1: Bed mobility with mod A in preparation for ADL tasks  ?Plan Discharge plan needs to be updated;Frequency needs to be updated   ? ?Co-evaluation ? ? ?   ?  ?  ?  ?  ? ?  ?AM-PAC OT "6 Clicks" Daily Activity     ?Outcome Measure ? ? Help from another person eating meals?: None ?Help from another person taking care of personal grooming?: A Little ?Help from another person toileting, which includes using toliet, bedpan, or urinal?: A Lot ?Help from another person bathing (including washing, rinsing, drying)?: A Lot ?Help from another person to put on and taking off regular upper body clothing?: A Lot ?Help from another person to put on and  taking off regular lower body clothing?: A Lot ?6 Click Score: 15 ? ?  ?End of Session Equipment Utilized During Treatment: Gait belt;Rolling walker (2 wheels) ? ?OT Visit Diagnosis: Other abnormalities of gait and mobility (R26.89);Muscle weakness (generalized) (M62.81);Pain ?Pain - Right/Left: Right ?Pain - part of body: Arm;Leg;Knee;Ankle and joints of foot ?  ?Activity Tolerance Patient tolerated treatment well ?  ?Patient Left with call bell/phone within reach;in bed;with bed alarm set;with family/visitor present ?  ?Nurse Communication Mobility status;Precautions;Weight bearing status;Patient requests pain meds ?  ? ?   ? ?Time: 6644-0347 ?OT Time Calculation (min): 29 min ? ?Charges: OT General Charges ?$OT Visit: 1 Visit ?OT Treatments ?$Self Care/Home Management : 23-37 mins ? ?Lynnda Child, OTD, OTR/L ?Acute Rehab ?[phone] ? ? ?Kaylyn Lim ?11/26/2021, 4:29 PM ?

## 2021-11-27 ENCOUNTER — Other Ambulatory Visit (HOSPITAL_COMMUNITY): Payer: Self-pay

## 2021-11-27 DIAGNOSIS — W11XXXA Fall on and from ladder, initial encounter: Secondary | ICD-10-CM | POA: Insufficient documentation

## 2021-11-27 LAB — CBC
HCT: 26 % — ABNORMAL LOW (ref 39.0–52.0)
Hemoglobin: 8.7 g/dL — ABNORMAL LOW (ref 13.0–17.0)
MCH: 31.5 pg (ref 26.0–34.0)
MCHC: 33.5 g/dL (ref 30.0–36.0)
MCV: 94.2 fL (ref 80.0–100.0)
Platelets: 230 10*3/uL (ref 150–400)
RBC: 2.76 MIL/uL — ABNORMAL LOW (ref 4.22–5.81)
RDW: 14.3 % (ref 11.5–15.5)
WBC: 8.8 10*3/uL (ref 4.0–10.5)
nRBC: 0 % (ref 0.0–0.2)

## 2021-11-27 LAB — RENAL FUNCTION PANEL
Albumin: 2.4 g/dL — ABNORMAL LOW (ref 3.5–5.0)
Anion gap: 12 (ref 5–15)
BUN: 69 mg/dL — ABNORMAL HIGH (ref 6–20)
CO2: 29 mmol/L (ref 22–32)
Calcium: 8.8 mg/dL — ABNORMAL LOW (ref 8.9–10.3)
Chloride: 90 mmol/L — ABNORMAL LOW (ref 98–111)
Creatinine, Ser: 9.38 mg/dL — ABNORMAL HIGH (ref 0.61–1.24)
GFR, Estimated: 6 mL/min — ABNORMAL LOW (ref >60)
Glucose, Bld: 90 mg/dL (ref 70–99)
Phosphorus: 5.6 mg/dL — ABNORMAL HIGH (ref 2.5–4.6)
Potassium: 4.5 mmol/L (ref 3.5–5.1)
Sodium: 131 mmol/L — ABNORMAL LOW (ref 135–145)

## 2021-11-27 LAB — GLUCOSE, CAPILLARY
Glucose-Capillary: 106 mg/dL — ABNORMAL HIGH (ref 70–99)
Glucose-Capillary: 114 mg/dL — ABNORMAL HIGH (ref 70–99)

## 2021-11-27 MED ORDER — OXYCODONE HCL 5 MG PO TABS
10.0000 mg | ORAL_TABLET | Freq: Four times a day (QID) | ORAL | 0 refills | Status: AC | PRN
  Filled 2021-11-27: qty 56, 7d supply, fill #0

## 2021-11-27 MED ORDER — DARBEPOETIN ALFA 100 MCG/0.5ML IJ SOSY
100.0000 ug | PREFILLED_SYRINGE | INTRAMUSCULAR | Status: DC
  Administered 2021-11-27: 100 ug via INTRAVENOUS
  Filled 2021-11-27: qty 0.5

## 2021-11-27 MED ORDER — VITAMIN D3 25 MCG PO TABS
2000.0000 [IU] | ORAL_TABLET | Freq: Every day | ORAL | 2 refills | Status: AC
  Filled 2021-11-27: qty 60, 30d supply, fill #0

## 2021-11-27 MED ORDER — DOCUSATE SODIUM 100 MG PO CAPS
100.0000 mg | ORAL_CAPSULE | Freq: Two times a day (BID) | ORAL | 0 refills | Status: AC
  Filled 2021-11-27: qty 14, 7d supply, fill #0

## 2021-11-27 NOTE — Progress Notes (Addendum)
?[facility] ?Progress Note  ? ?Subjective:  Seen on HD - 2.5L UFG and tolerating so far. Denies CP/dyspnea. Possibly being discharged home this afternoon. ? ?Objective ?Vitals:  ? 11/26/21 2156 11/27/21 7341 11/27/21 0710 11/27/21 0720  ?BP: 128/63  (!) 162/62 134/68  ?Pulse: 67  66 61  ?Resp: '16  13 12  '$ ?Temp: 98.3 ?F (36.8 ?C)  98 ?F (36.7 ?C)   ?TempSrc: Oral Oral Oral   ?SpO2:      ?Weight:   69 kg   ?Height:      ? ?Physical Exam ?General: Well appearing man, NAD. Room air ?Heart: RRR; no murmur ?Lungs: CTAB ?Abdomen: soft ?Extremities: BLE in casts, no thigh edema, L forearm in cast ?Dialysis Access: LUE AVF + bruit ? ?Additional Objective ?Labs: ?Basic Metabolic Panel: ?Recent Labs  ?Lab 11/20/21 ?9379 11/20/21 ?1418 11/22/21 ?0146 11/24/21 ?1853 11/27/21 ?0426  ?NA 137   < > 132* 131* 131*  ?K 5.8*   < > 4.2 4.9 4.5  ?CL 94*   < > 92* 88* 90*  ?CO2 23  --  '23 26 29  '$ ?GLUCOSE 145*   < > 193* 111* 90  ?BUN 94*   < > 78* 91* 69*  ?CREATININE 13.24*   < > 9.76* 10.47* 9.38*  ?CALCIUM 9.4  --  9.8 9.4 8.8*  ?PHOS 9.1*  --   --  6.5* 5.6*  ? < > = values in this interval not displayed.  ? ?Liver Function Tests: ?Recent Labs  ?Lab 11/20/21 ?0240 11/24/21 ?1853 11/27/21 ?0426  ?ALBUMIN 3.1* 2.7* 2.4*  ? ?CBC: ?Recent Labs  ?Lab 11/20/21 ?9735 11/20/21 ?1418 11/22/21 ?0146 11/24/21 ?1827 11/27/21 ?0426  ?WBC 9.3  --  9.7 9.7 8.8  ?NEUTROABS 6.8  --   --  6.7  --   ?HGB 8.2*   < > 9.7* 10.0* 8.7*  ?HCT 25.4*   < > 28.9* 30.6* 26.0*  ?MCV 95.1  --  91.7 95.0 94.2  ?PLT 145*  --  163 251 230  ? < > = values in this interval not displayed.  ? ?Medications: ? methocarbamol (ROBAXIN) IV    ? sodium chloride    ? ? (feeding supplement) PROSource Plus  30 mL Oral BID BM  ? sodium chloride   Intravenous Once  ? sodium chloride   Intravenous Once  ? amLODipine  10 mg Oral Daily  ? Chlorhexidine Gluconate Cloth  6 each Topical Q0600  ? cholecalciferol  2,000 Units Oral Daily  ? cinacalcet  60 mg Oral Q supper   ? darbepoetin (ARANESP) injection - DIALYSIS  60 mcg Intravenous Q Thu-HD  ? docusate sodium  100 mg Oral BID  ? doxercalciferol  1 mcg Intravenous Q T,Th,Sa-HD  ? heparin injection (subcutaneous)  5,000 Units Subcutaneous Q8H  ? insulin aspart  0-6 Units Subcutaneous TID WC  ? lanthanum  2,000 mg Oral TID WC  ? mouth rinse  15 mL Mouth Rinse BID  ? metoprolol tartrate  12.5 mg Oral BID  ? rosuvastatin  20 mg Oral Daily  ? ? ?Dialysis Orders: ?TTS at [facility] ?3:45hr, 400/800, EDW 63.5kg, 2K/2Ca, LUE AVF, no heparin ?- No ESA/iron, last Hgb 11 ?- Hectoral 28mg IV q HD ?  ?Assessment/Plan: ?1. Fall from tree/multiple traumatic limb fractures: Ortho following, surgery 11/20/21, per notes: closed tx L elbow dislocation and ORIF to R tibial, L ankle, L wrist.   Nurse had to upwrap upper LUE dressing and cut portion of  cast to expose fistula for cannulation to facilitate dialysis.  L elbow/wrist remains stabilized. ?2. ESRD: Continue HD per usual TTS schedule - HD now, 2.5L UFG, ?3. HTN/volume: BP decent.  Home amlodipine/metoprolol restarted.  Does not appear volume overloaded, lungs clear, although up per weights. Hard to determine accurate weights with multiple casts. UF as tolerated.  ?4. Anemia of ESRD: Hgb 8.7 today. S/p 2 units pRBC this admit.  Increasing Aranesp for today. ?5. Secondary hyperparathyroidism: Ca ok, Phos improving on home binders (fosrenol) and sensipar. ?6. Nutrition: Continue supplement for wound healing.  Renal diet w/fluid restrictions.  ?7. T2DM ?8. HL  ?9. Dispo: Looks like plan is home with home health now, possibly later today. ? ?Veneta Penton, PA-C ?11/27/2021, 8:10 AM  ?[facility] ? ?Nephrology attending: ?I have personally seen and examined the patient.  Chart reviewed.  I agree with above. ? ?He is tolerating dialysis well.  No issue with AV fistula.  Noted that the plan is possibly discharge home today.  Resume outpatient dialysis.  He will need [facility] lift transfer which  was already arranged by the Education officer, museum. ? ?D.  Carolin Sicks, ?[facility]. ? ? ? ?

## 2021-11-27 NOTE — TOC Transition Note (Signed)
Transition of Care (TOC) - CM/SW Discharge Note ? ? ?Patient Details  ?Name: Blake Santana ?MRN: [id number] ?Date of Birth: [birth year] ? ?Transition of Care [facility]) CM/SW Contact:  ?Sharin Mons, RN ?Phone Number: ?11/27/2021, 12:24 PM ? ? ?Clinical Narrative:    ?Patient will DC to: home ?Anticipated DC date: 11/27/2021 ?Family notified:yes ?Transport by: Corey Harold ? ?Per MD patient ready for DC today. RN, patient,and  patient's family (wife and son Alan,[phone], speaks [place]) notified of DC.  ?[facility] unable to provide home health services under charity care 2/ staffing, MD made aware ? ?Post hospital f/u noted on AVS. ? ?TOC team will deliver Rx meds to bedside prior to d/c. ? ?PTAR/Ambulance transport requested for patient.  ? ?RNCM will sign off for now as intervention is no longer needed. Please consult [place] again if new needs arise.  ? ? ?Final next level of care: Home/Self Care (unable to secure charity home health) ?Barriers to Discharge: No Barriers Identified ? ? ?Patient Goals and CMS Choice ?Patient states their goals for this hospitalization and ongoing recovery are:: return home with wife and son ?CMS Medicare.gov Compare Post Acute Care list provided to:: Patient ?Choice offered to / list presented to : Patient ? ?Discharge Placement ?  ?           ?  ?  ?  ?  ? ?Discharge Plan and Services ?In-house Referral: Clinical Social Work, Counselling psychologist ?Discharge Planning Services: CM Consult ?Post Acute Care Choice: Home Health, Durable Medical Equipment          ?DME Arranged: 3-N-1, Wheelchair manual (hoyer lift) ?DME Agency: [employer] ?Date DME Agency Contacted: 11/24/21 ?Time DME Agency Contacted: [id number] ?Representative spoke with at DME Agency: Delana Meyer ?HH Arranged: PT, OT, Social Work ([place] unable to provide charity home health services 2/2 staffing ?[place] Agency: [place] ([place]) ?Date HH Agency Contacted: 11/26/21 ?Time [place]: 1200 ?Representative spoke with at [place]: [place] ? ?Social Determinants of Health (SDOH) Interventions ?  ? ? ?Readmission Risk Interventions ?   ? View : No data to display.  ?  ?  ?  ? ? ? ? ? ?

## 2021-11-27 NOTE — Discharge Summary (Signed)
?Physician Discharge Summary  ?Blake Santana GLO:[id number] DOB: 01-Jul-1971 DOA: 11/17/2021 ? ?PCP: [name], NP ? ?Admit date: 11/17/2021 ?Discharge date: 11/27/2021 ? ?Admitted From: Home ?Disposition: Home ? ?Recommendations for Outpatient Follow-up:  ?Follow up with PCP in 1-2 weeks ?Follow-up with orthopedics, Dr. Doreatha Martin 2 weeks ?Glipizide discontinued due to hypoglycemic events ? ?Home Health: PT/OT/SW ?Equipment/Devices: BSC, Hoyer lift, wheelchair ? ?Discharge Condition: Stable ?CODE STATUS: Full code ?Diet recommendation: Renal/consistent carbohydrate diet ? ?History of present illness: ? ?Blake Santana is a 51 year old male with past medical history significant for ESRD on HD TTS, type 2 diabetes mellitus with diabetic retinopathy, anemia of chronic renal disease, essential hypertension, hyperlipidemia, PAD who presented to [facility] ED on 4/17 for trauma evaluation after he fell from a 20 foot height. Patient states that he was cutting tree branches while standing on a ladder at his house when a branch hit him in the chest and he fell about 20 feet.  EMS were called and per ED documentation he had notable deformity to his left shoulder and left ankle.  He was placed on c-collar and given 100 mcg of fentanyl then brought to the ED for further evaluation. ?  ?In the ED, BP 222/118, HR 85, RR 18, temp 97.8 ?F, SPO2 100% on 3 L O2 via [place]. Sodium 136, potassium 4.8, bicarb 24, BUN 75, creatinine 10.78, serum glucose 229, AST 19, ALT 16, alk phos 289, total bilirubin 0.7, WBC 12.2, hemoglobin 10.7, platelets 172,000, serum ethanol <10, lactic acid 1.2, INR 1.1, troponin 30. SARS-CoV-2 and influenza PCR negative. ?  ?Extensive traumatic imaging notable for: ?Acute comminuted left inferior pubic ramus fracture ?Acute comminuted fracture of the left distal fibula and tibia with extension to the medial malleolus; possible left calcaneal fracture ?Acute comminuted fracture left distal  radius ?Mildly displaced fracture of the base of proximal phalanx of right fifth toe ?Possible proximal right tibial fracture ?Posterior dislocation of the left elbow ?Bilateral lung atelectasis ?Negative for acute fracture or traumatic subluxation of the C-spine ?Negative for acute intracranial abnormality ?  ?Patient was given 1 L LR.  Left elbow dislocation was reduced under sedation in the ED, follow-up x-ray shows anatomic alignment on single view.  EDP discussed with trauma surgery and orthopedics.  Medical admission was requested.  Orthopedics will see in consultation for possible surgical intervention.  The hospitalist service was consulted to admit for further evaluation and management. ? ?Hospital course: ? ?Closed fracture of inferior pubic ramus, left,  ?Acute comminuted fracture of the left distal fibula and tibia with extension to the medial malleolus; possible left calcaneal fracture ?Acute comminuted fracture left distal radius ?Mildly displaced fracture of the base of proximal phalanx of right fifth toe ?Possible proximal right tibial fracture ?Posterior dislocation of the left elbow, s/p reduction in the ED ?Traumatic fall from 20 foot height ?Patient presenting to ED following 20 foot fall from a tree with multiple fractures.  Orthopedics was consulted and patient underwent ORIF right tibial plateau fracture, ORIF left ankle pilon fracture, ORIF left distal radial fracture and nonoperative management of left inferior pubis ramus fracture by Dr. Doreatha Martin on 11/20/2021.   ?Weightbearing:  ?- RUE: WBAT ?- LUE: NWB ?- RLE: NWB ?- LLE: NWB ?ROM:  ?- RUE: Unrestricted ROM ?- LUE: Okay for shoulder motion as tolerated.  Maintain long-arm splint ?- RLE: Unrestricted hip, knee, ankle ROM as tolerated ?- LLE: Maintain short leg splint.  Okay for unrestricted knee and hip ROM as tolerated ?Incisional  and dressing care:   ?- LUE: Maintain splint.  Okay to make a window in Ace wrap for hemodialysis access ?- RLE:  Continue to change Mepilex as needed.  Okay to leave open to air if no drainage. ?- LLE: Maintain short leg splint, keep clean and dry ?Showering: Okay to shower, keep LUE and LLE splint clean and dry.  RLE incisions may get wet ?Orthopedic device(s): Splint LUE, LLE ?--Pain control with Tylenol/oxycodone PRN ?--Discharge with home health PT/OT ?--Likely to remain nonweightbearing status as above for at least 6 weeks ?--Outpatient follow-up with Dr. Doreatha Martin 2 weeks ?  ?ESRD on HD ?Continues on TTS schedule, Nephrology following for continued hemodialysis while inpatient ?  ?Type 2 diabetes mellitus ?On glipizide at home which was discontinued due to multiple episodes of hypoglycemia. Hemoglobin A1c 7.0, well controlled. ?  ?Essential hypertension ?Continue amlodipine 10 mg p.o. daily and Metoprolol tartrate 12.5 mg p.o. twice daily ?  ?Anemia of chronic creatinine disease ?Patient transfused 2 units PVCs during hospitalization.  Likely complicated by postoperative anemia with multiple fractures.  Hemoglobin stable, 8.7 at time of discharge. ?  ?Hyperlipidemia ?Continue Crestor 20 mg p.o. daily ? ? ? ?Discharge Diagnoses:  ?Principal Problem: ?  Closed fracture of inferior pubic ramus, left, initial encounter ([place]) ?Active Problems: ?  Fracture of distal end of tibia with fibula, left, closed, initial encounter ?  Distal radius fracture, left ?  Posterior dislocation of left elbow ?  Injury resulting from fall from height ?  Closed fracture of proximal end of right tibia ?  Disp fx of proximal phalanx of right lesser toe(s), init ?  ESRD on hemodialysis TTS ([place]) ?  Type 2 diabetes mellitus with complication, without long-term current use of insulin ([place]) ?  Hypertension associated with diabetes ([place]) ?  Anemia of chronic kidney failure ?  Hyperlipidemia associated with type 2 diabetes mellitus ([place]) ? ? ? ?Discharge Instructions ? ?Discharge Instructions   ? ? Call MD for:  difficulty breathing, headache or visual  disturbances   Complete by: As directed ?  ? Call MD for:  extreme fatigue   Complete by: As directed ?  ? Call MD for:  persistant dizziness or light-headedness   Complete by: As directed ?  ? Call MD for:  persistant nausea and vomiting   Complete by: As directed ?  ? Call MD for:  severe uncontrolled pain   Complete by: As directed ?  ? Call MD for:  temperature >100.4   Complete by: As directed ?  ? Diet - low sodium heart healthy   Complete by: As directed ?  ? Increase activity slowly   Complete by: As directed ?  ? Leave dressing on - Keep it clean, dry, and intact until clinic visit   Complete by: As directed ?  ? ?  ? ?Allergies as of 11/27/2021   ?No Known Allergies ?  ? ?  ?Medication List  ?  ? ?STOP taking these medications   ? ?glipiZIDE 10 MG tablet ?Commonly known as: GLUCOTROL ?  ? ?  ? ?TAKE these medications   ? ?acetaminophen 500 MG tablet ?Commonly known as: TYLENOL ?Take 1,000 mg by mouth every 6 (six) hours as needed for moderate pain or headache. ?  ?AgaMatrix Presto Test test strip ?Generic drug: glucose blood ?Check sugars twice daily ?  ?AgaMatrix Presto w/Device Kit ?Check sugars twice daily ?  ?AgaMatrix Ultra-Thin Lancets Misc ?Check blood glucose twice daily before meals. ?  ?  amLODipine 5 MG tablet ?Commonly known as: NORVASC ?Take 1 tablet by mouth once daily with breakfast ?What changed:  ?how much to take ?how to take this ?when to take this ?additional instructions ?  ?aspirin-acetaminophen-caffeine 250-250-65 MG tablet ?Commonly known as: [place] ?Take 1-2 tablets by mouth every 6 (six) hours as needed for headache. ?  ?b complex-vitamin c-folic acid 0.8 MG Tabs tablet ?Take 1 tablet by mouth Every Tuesday,Thursday,and Saturday with dialysis. ?  ?cinacalcet 60 MG tablet ?Commonly known as: SENSIPAR ?Take 60 mg by mouth daily. ?  ?docusate sodium 100 MG capsule ?Commonly known as: COLACE ?Take 1 capsule (100 mg total) by mouth 2 (two) times daily for 7 days. ?  ?lanthanum  1000 MG chewable tablet ?Commonly known as: FOSRENOL ?Chew 2,000 mg by mouth 3 (three) times daily with meals. ?  ?lidocaine-prilocaine cream ?Commonly known as: EMLA ?Apply 1 application topically 3 (three) times

## 2021-11-27 NOTE — Progress Notes (Signed)
Discharge instructions given to the patient and his spouse with online Spanish interpreter assistance.  Reviewed instructions/education from PT and CM, both verbalized understanding.  Encouraged to call the doctor for questions.  PTAR on the floor to pick up patient.  Patient discharged home. ?

## 2021-11-27 NOTE — Progress Notes (Signed)
Pt d/c to home today. Contacted [place] and spoke to [place]. Clinic aware pt will resume care on Saturday and will require assistance via hoyer lift for transfer from w/c to HD chair.  ? ?Melven Sartorius ?Renal Navigator ?[phone] ?

## 2021-11-28 ENCOUNTER — Telehealth (HOSPITAL_COMMUNITY): Payer: Self-pay | Admitting: Nephrology

## 2021-11-28 ENCOUNTER — Telehealth: Payer: Self-pay

## 2021-11-28 NOTE — Telephone Encounter (Signed)
Transition of care contact from inpatient facility ? ?Date of discharge: 11/27/21 ?Date of contact: 11/28/21 ?Method: Phone ?Spoke to: Patient's wife Regino Schultze) ? ?Patient contacted to discuss transition of care from recent inpatient hospitalization. Patient was admitted to [facility] from 4/17 - 11/27/21 with discharge diagnosis of fall from tree, traumatic limb fractures - s/p ORIF to B ankles, L wrist ? ?Medication changes were reviewed. She has everything, pain controlled at the moment. ? ?Patient will follow up with his/her outpatient HD unit on: tomorrow. Reminded to bring hoyer lift to HD tomorrow - place on wheelchair prior to him sitting in it for transfer. ? ?She denies other needs at this time. ? ?Veneta Penton, PA-C ?[facility] ?Pager [phone] ? ? ? ?

## 2021-11-28 NOTE — Telephone Encounter (Signed)
Transition Care Management Follow-up Telephone Call ?Date of discharge and from where: [facility] 11/27/2021 ?How have you been since you were released from the hospital? Call pt wife answered DPR on file. Patient authorizes Isaac Laud [phone]  to receive PHI for all  medical group practices. Wife stated he is feeling ok   ?Any questions or concerns? None verbalized No ? ?Items Reviewed: ?Did the pt receive and understand the discharge instructions provided? Yes  ?Medications obtained and verified? Yes  ?Other? No  ?Any new allergies since your discharge? yes ?Dietary orders reviewed? No ?Do you have support at home? Yes  ? ?Home Care and Equipment/Supplies: ?Were home health services ordered? yes ?Home Health, Durable Medical Equipment          ?DME Arranged: 3-N-1, Wheelchair manual (hoyer lift) ? ?Functional Questionnaire: (I = Independent and D = Dependent) ?ADLs with assistance ? ? ?Follow up appointments reviewed: ? ?PCP Hospital f/u appt confirmed? No  wife stated will call to schedule  ?[facility] f/u appt confirmed? No  No  wife stated will call to schedule . ?Are transportation arrangements needed? No  ?If their condition worsens, is the pt aware to call PCP or go to the Emergency Dept.? Yes ?Was the patient provided with contact information for the PCP's office or ED? Yes ?Was to pt encouraged to call back with questions or concerns? Yes  ? ?  ?

## 2021-12-01 ENCOUNTER — Telehealth: Payer: Self-pay | Admitting: Family

## 2021-12-01 NOTE — Telephone Encounter (Signed)
Copied from [facility]. Topic: General - Other ?>> Dec 01, 2021  1:43 PM Blake Santana wrote: ?Reason for CRM: Patient wife called in stated that he was in the hospital for 2 weeks and was recently discharged and need Santana hospital follow up but first available is 12/17/21 need between 7 to 14 days. Please call patient or wife need to be spanish speaking. Can be reached at  Ph# [phone] or [phone] ?

## 2021-12-07 NOTE — Progress Notes (Signed)
TRANSITION OF CARE VISIT ? ? ?Primary Care Provider (PCP):            Durene Fruits, NP ?                                                      [facility] ?[place],    [address] ?Phone: [phone] ?Fax: [phone] ? ? ? ? ?Date of Admission: 11/17/2021 ? ?Date of Discharge: 11/27/2021 ? ?Transitions of Care Call: 11/28/2021 ? ?Discharged from: [facility]  ? ?Discharge Diagnosis:  ?Principal Problem: ?  Closed fracture of inferior pubic ramus, left, initial encounter ([place]) ?Active Problems: ?  Fracture of distal end of tibia with fibula, left, closed, initial encounter ?  Distal radius fracture, left ?  Posterior dislocation of left elbow ?  Injury resulting from fall from height ?  Closed fracture of proximal end of right tibia ?  Disp fx of proximal phalanx of right lesser toe(s), init ?  ESRD on hemodialysis TTS ([place]) ?  Type 2 diabetes mellitus with complication, without long-term current use of insulin ([place]) ?  Hypertension associated with diabetes ([place]) ?  Anemia of chronic kidney failure ?  Hyperlipidemia associated with type 2 diabetes mellitus ([place]) ?  ?Summary of Admission per DO note: ?Closed fracture of inferior pubic ramus, left,  ?Acute comminuted fracture of the left distal fibula and tibia with extension to the medial malleolus; possible left calcaneal fracture ?Acute comminuted fracture left distal radius ?Mildly displaced fracture of the base of proximal phalanx of right fifth toe ?Possible proximal right tibial fracture ?Posterior dislocation of the left elbow, s/p reduction in the ED ?Traumatic fall from 20 foot height ?Patient presenting to ED following 20 foot fall from a tree with multiple fractures.  Orthopedics was consulted and patient underwent ORIF right tibial plateau fracture, ORIF left ankle pilon fracture, ORIF left distal radial fracture and nonoperative  management of left inferior pubis ramus fracture by Dr. Doreatha Martin on 11/20/2021.   ?Weightbearing:  ?- RUE: WBAT ?- LUE: NWB ?- RLE: NWB ?- LLE: NWB ?ROM:  ?- RUE: Unrestricted ROM ?- LUE: Okay for shoulder motion as tolerated.  Maintain long-arm splint ?- RLE: Unrestricted hip, knee, ankle ROM as tolerated ?- LLE: Maintain short leg splint.  Okay for unrestricted knee and hip ROM as tolerated ?Incisional and dressing care:   ?- LUE: Maintain splint.  Okay to make a window in Ace wrap for hemodialysis access ?- RLE: Continue to change Mepilex as needed.  Okay to leave open to air if no drainage. ?- LLE: Maintain short leg splint, keep clean and dry ?Showering: Okay to shower, keep LUE and LLE splint clean and dry.  RLE incisions may get wet ?Orthopedic device(s): Splint LUE, LLE ?--Pain control with Tylenol/oxycodone PRN ?--Discharge with home health PT/OT ?--Likely to remain nonweightbearing status as above for at least 6 weeks ?--Outpatient follow-up with Dr. Doreatha Martin 2 weeks ?  ?ESRD on HD ?Continues on TTS schedule, Nephrology following for continued hemodialysis while inpatient ?  ?Type 2 diabetes mellitus ?On glipizide at home which was discontinued due to multiple episodes of hypoglycemia. Hemoglobin A1c 7.0, well controlled. ?  ?Essential hypertension ?Continue amlodipine 10 mg p.o. daily and Metoprolol tartrate 12.5 mg p.o. twice daily ?  ?Anemia  of chronic creatinine disease ?Patient transfused 2 units PVCs during hospitalization.  Likely complicated by postoperative anemia with multiple fractures.  Hemoglobin stable, 8.7 at time of discharge. ?  ?Hyperlipidemia ?Continue Crestor 20 mg p.o. daily ? ?Follow-Ups ?1 Follow up with [facility]; Pleases call and arrange transportation to and from  Hemodialysis session, [phone] ?2   Follow up with Sueanne Margarita, MD (Cardiology) ?3 Schedule an appointment with Haddix, Thomasene Lot, MD (Orthopedic Surgery) in 2 weeks (12/11/2021) ? ?TODAY's VISIT ?Closed fracture of  inferior pubic ramus, left, follow-up: ?Acute comminuted fracture of the left distal fibula and tibia with extension to the medial malleolus; possible left calcaneal fracture follow-up: ?Acute comminuted fracture left distal radius follow-up: ?Mildly displaced fracture of the base of proximal phalanx of right fifth toe follow-up: ?Possible proximal right tibial fracture follow-up: ?Posterior dislocation of the left elbow, s/p reduction in the ED follow-up: ?Traumatic fall from 20 foot height follow-up: ?Doing ok but still having some pain. Recently seen by Katha Hamming, MD on 12/08/2021 at [facility]. Physical therapy ordered at that time. Patient reports already has wheelchair, hoyer lift, and bedside commode. ? ?ESRD on HD ?Reports still on TTS schedule. Confirms established with [employer] for transportation to hemodialysis.   ? ?Type 2 diabetes mellitus follow-up: ?Confirms no longer taking Glipizide. Requesting diabetic testing supplies.  ?  ?Essential hypertension follow-up: ?Taking Amlodipine and Metoprolol as prescribed. Checking blood pressure at home sometimes. Denies chest pain, shortness of breath and additional red flag symptoms. Aware of appointment 01/15/22 at Cardiology.  ?   ?Hyperlipidemia follow-up: ?Taking Crestor as prescribed. Aware of appointment 01/15/22 at Cardiology.  ?   ? ?Follow-Ups ?1 Follow up with [facility]; Pleases call and arrange transportation to and from  Hemodialysis session, [phone] ?2   Follow up with Sueanne Margarita, MD (Cardiology) ?3 Schedule an appointment with Haddix, Thomasene Lot, MD (Orthopedic Surgery) in 2 weeks (12/11/2021) ? ? ?Home Health: PT/OT/SW  ?Equipment/Devices: BSC, Hoyer lift, wheelchair have equipment?  ? ?Patient/Caregiver self-reported problems/concerns: None  ? ?MEDICATIONS ? ?Medication Reconciliation conducted with patient/caregiver? (Yes/ No): Yes ? ?New medications prescribed/discontinued upon discharge? (Yes/No):  Yes ? ?Barriers identified related to medications: No  ? ?LABS ? ?Lab Reviewed (Yes/No/NA): Yes  ? ?PHYSICAL EXAM:  ? ? ?  12/10/2021  ?  9:40 AM 11/27/2021  ? 11:20 AM 11/27/2021  ? 11:00 AM  ?Vitals with BMI  ?Height 5' 4.488"    ?Systolic 893 810 175  ?Diastolic 80 71 67  ?Pulse 71 66 67  ? ? ?Physical Exam ?HENT:  ?   Head: Normocephalic and atraumatic.  ?Eyes:  ?   Extraocular Movements: Extraocular movements intact.  ?   Conjunctiva/sclera: Conjunctivae normal.  ?   Pupils: Pupils are equal, round, and reactive to light.  ?Cardiovascular:  ?   Rate and Rhythm: Normal rate and regular rhythm.  ?   Pulses: Normal pulses.  ?   Heart sounds: Normal heart sounds.  ?Pulmonary:  ?   Effort: Pulmonary effort is normal.  ?   Breath sounds: Normal breath sounds.  ?Musculoskeletal:  ?   Cervical back: Normal range of motion and neck supple.  ?   Comments: Left wrist splint in place. Left lower extremity CAM walker in place. Right lower extremity ankle contracture boot in place.   ?Neurological:  ?   General: No focal deficit present.  ?   Mental Status: He is alert and oriented to person, place, and  time.  ?Psychiatric:     ?   Mood and Affect: Mood normal.     ?   Behavior: Behavior normal.  ? ? ?ASSESSMENT & PLAN: ?[facility] discharge follow-up: ?- Reviewed hospital course, current medications, ensured proper follow-up in place, and addressed concerns.  ? ?2. Closed fracture of inferior pubic ramus, left, initial encounter [facility]): ?3. Fracture of distal end of tibia with fibula, left, sequela: ?4. Closed fracture of distal end of left radius with routine healing, unspecified fracture morphology, subsequent encounter: ?5. Posterior dislocation of left elbow, subsequent encounter: ?6. Injury resulting from fall from height: ?7. Closed fracture of proximal end of right tibia with routine healing, unspecified fracture morphology, subsequent encounter: ?8. Disp fx of proximal phalanx of right lesser toe(s): ?- Keep all  scheduled appointments with Katha Hamming, MD at Orthopedics.  ? ?9. Hypertension associated with diabetes ([place]): ?10. Hyperlipidemia associated with type 2 diabetes mellitus ([place]): ?- Keep appointment scheduled 01/15/2022 w

## 2021-12-10 ENCOUNTER — Other Ambulatory Visit: Payer: Self-pay

## 2021-12-10 ENCOUNTER — Encounter (HOSPITAL_COMMUNITY): Payer: Self-pay

## 2021-12-10 ENCOUNTER — Ambulatory Visit (INDEPENDENT_AMBULATORY_CARE_PROVIDER_SITE_OTHER): Payer: Self-pay | Admitting: Family

## 2021-12-10 ENCOUNTER — Encounter: Payer: Self-pay | Admitting: Family

## 2021-12-10 VITALS — BP 138/80 | HR 71 | Temp 98.6°F | Resp 18 | Ht 64.49 in

## 2021-12-10 DIAGNOSIS — Z992 Dependence on renal dialysis: Secondary | ICD-10-CM

## 2021-12-10 DIAGNOSIS — E1122 Type 2 diabetes mellitus with diabetic chronic kidney disease: Secondary | ICD-10-CM

## 2021-12-10 DIAGNOSIS — Z09 Encounter for follow-up examination after completed treatment for conditions other than malignant neoplasm: Secondary | ICD-10-CM

## 2021-12-10 DIAGNOSIS — Z789 Other specified health status: Secondary | ICD-10-CM

## 2021-12-10 DIAGNOSIS — S82832S Other fracture of upper and lower end of left fibula, sequela: Secondary | ICD-10-CM

## 2021-12-10 DIAGNOSIS — Z758 Other problems related to medical facilities and other health care: Secondary | ICD-10-CM

## 2021-12-10 DIAGNOSIS — S82101D Unspecified fracture of upper end of right tibia, subsequent encounter for closed fracture with routine healing: Secondary | ICD-10-CM

## 2021-12-10 DIAGNOSIS — N186 End stage renal disease: Secondary | ICD-10-CM

## 2021-12-10 DIAGNOSIS — S82302S Unspecified fracture of lower end of left tibia, sequela: Secondary | ICD-10-CM

## 2021-12-10 DIAGNOSIS — S32592A Other specified fracture of left pubis, initial encounter for closed fracture: Secondary | ICD-10-CM

## 2021-12-10 DIAGNOSIS — E1169 Type 2 diabetes mellitus with other specified complication: Secondary | ICD-10-CM

## 2021-12-10 DIAGNOSIS — I152 Hypertension secondary to endocrine disorders: Secondary | ICD-10-CM

## 2021-12-10 DIAGNOSIS — E785 Hyperlipidemia, unspecified: Secondary | ICD-10-CM

## 2021-12-10 DIAGNOSIS — E1159 Type 2 diabetes mellitus with other circulatory complications: Secondary | ICD-10-CM

## 2021-12-10 DIAGNOSIS — S52502D Unspecified fracture of the lower end of left radius, subsequent encounter for closed fracture with routine healing: Secondary | ICD-10-CM

## 2021-12-10 DIAGNOSIS — S92511A Displaced fracture of proximal phalanx of right lesser toe(s), initial encounter for closed fracture: Secondary | ICD-10-CM

## 2021-12-10 DIAGNOSIS — W1789XA Other fall from one level to another, initial encounter: Secondary | ICD-10-CM

## 2021-12-10 DIAGNOSIS — S53125D Posterior dislocation of left ulnohumeral joint, subsequent encounter: Secondary | ICD-10-CM

## 2021-12-10 MED ORDER — TRUEPLUS LANCETS 28G MISC
4 refills | Status: AC
  Filled 2021-12-10 – 2021-12-31 (×2): qty 100, 25d supply, fill #0

## 2021-12-10 MED ORDER — TRUE METRIX BLOOD GLUCOSE TEST VI STRP
ORAL_STRIP | 12 refills | Status: AC
  Filled 2021-12-10 – 2021-12-31 (×2): qty 100, 25d supply, fill #0

## 2021-12-10 MED ORDER — TRUE METRIX METER W/DEVICE KIT
PACK | 0 refills | Status: DC
  Filled 2021-12-10: qty 1, 25d supply, fill #0
  Filled 2021-12-31: qty 1, 365d supply, fill #0

## 2021-12-10 NOTE — Progress Notes (Signed)
Pt presents for hospitalization follow-up  ?Pt needs blood glucose monitoring kit w/supplies  ?

## 2021-12-17 ENCOUNTER — Other Ambulatory Visit: Payer: Self-pay

## 2021-12-31 ENCOUNTER — Encounter (HOSPITAL_COMMUNITY): Payer: Self-pay

## 2021-12-31 ENCOUNTER — Other Ambulatory Visit: Payer: Self-pay

## 2022-01-07 ENCOUNTER — Encounter (HOSPITAL_COMMUNITY): Payer: Self-pay

## 2022-01-07 ENCOUNTER — Other Ambulatory Visit: Payer: Self-pay

## 2022-01-08 ENCOUNTER — Encounter (HOSPITAL_COMMUNITY): Payer: Self-pay

## 2022-01-15 ENCOUNTER — Ambulatory Visit: Payer: Self-pay | Admitting: Cardiology

## 2022-01-15 NOTE — Progress Notes (Deleted)
Cardiology CONSULT Note    Date:  01/15/2022   ID:  Blake Santana, DOB Jul 04, 1971, MRN [id number]  PCP:  Blake Herter, NP  Cardiologist:  Blake Him, MD   No chief complaint on file.   History of Present Illness:  Blake Santana is a 51 y.o. male who is being seen today for the evaluation to reestablish cardiac care at the request of Blake Herter, NP.  This is a 51 year old male with past medical history significant for ESRD on HD TTS, type 2 diabetes mellitus with diabetic retinopathy, anemia of chronic renal disease, essential hypertension, nonobstructive CAD hyperlipidemia, PAD.  He was seen by cardiology in the hospital over 3 years ago but failed to follow-up.  At that time he had presented to the ER with chest pain and mildly elevated troponin.  He was hyperkalemic after missing dialysis for several days.  He was diagnosed with pneumonia.  Cardiac cath done for elevated troponin showed a 10% stenosis of the mid LAD and 10% stenosis of the D2 with mildly elevated LVEDP.  Medical management was recommended.  He denies any chest pain or pressure, SOB, DOE, PND, orthopnea, LE edema, dizziness, palpitations or syncope. He is compliant with his meds and is tolerating meds with no SE.     Past Medical History:  Diagnosis Date   Acute respiratory failure with hypoxia ([place]) 09/07/2018   Anemia of chronic kidney failure 03/11/2017   Cataracts, bilateral 2019   Chest pain 09/01/2018   CHF (congestive heart failure) ([place])    Chronic kidney disease 03/11/2017   Diabetes mellitus without complication ([place]) 3785   Edema 01/14/2017   Headache    Hypercholesterolemia    Hypertension 03/11/2017   Hypertensive retinopathy of both eyes 2019   Iron deficiency anemia 06/18/2016   Pneumonia 09/07/2018   Proliferative diabetic retinopathy with macular edema ([place]) 2019   Retinal hemorrhage, bilateral and left vitreous hemorrhage.  Being treated with [facility].  Dr. Iona Hansen    RSV (respiratory syncytial virus infection) 08/2017    Past Surgical History:  Procedure Laterality Date   A/V FISTULAGRAM Left 01/19/2018   Procedure: A/V FISTULAGRAM - Left AV;  Surgeon: Waynetta Sandy, MD;  Location: [facility];  Service: Cardiovascular;  Laterality: Left;   AV FISTULA PLACEMENT Left 08/09/2017   Procedure: LEFT RADIOCEPHALIC ARTERIOVENOUS (AV) FISTULA CREATION;  Surgeon: [name], MD;  Location: [facility];  Service: Vascular;  Laterality: Left;   AV FISTULA PLACEMENT Left 02/02/2018   Procedure: ARTERIOVENOUS (AV) FISTULA CREATION LEFT UPPER EXTREMITY;  Surgeon: [name], MD;  Location: [facility];  Service: Vascular;  Laterality: Left;   INSERTION OF DIALYSIS CATHETER  08/09/2017   INSERTION OF DIALYSIS CATHETER Right 08/09/2017   Procedure: INSERTION OF DIALYSIS CATHETER RIGHT INTERNAL JUGULAR;  Surgeon: [name], MD;  Location: [facility];  Service: Vascular;  Laterality: Right;   IR DIALY SHUNT INTRO NEEDLE/INTRACATH INITIAL W/IMG LEFT Left 07/10/2020   IR DIALY SHUNT INTRO NEEDLE/INTRACATH INITIAL W/IMG LEFT Left 04/14/2021   IR REMOVAL TUN CV CATH W/O FL  07/01/2018   LEFT HEART CATH AND CORONARY ANGIOGRAPHY N/A 09/05/2018   Procedure: LEFT HEART CATH AND CORONARY ANGIOGRAPHY;  Surgeon: [name], MD;  Location: [facility];  Service: Cardiovascular;  Laterality: N/A;   OPEN REDUCTION INTERNAL FIXATION (ORIF) DISTAL RADIAL FRACTURE Left 11/20/2021   Procedure: POSSIBLE OPEN REDUCTION INTERNAL FIXATION (ORIF) DISTAL RADIAL FRACTURE;  Surgeon: [name],  MD;  Location: [facility];  Service: Orthopedics;  Laterality: Left;   ORIF ANKLE FRACTURE Left 11/20/2021   Procedure: OPEN REDUCTION INTERNAL FIXATION (ORIF) ANKLE PILON FRACTURE;  Surgeon: [name], MD;  Location: [facility];  Service: Orthopedics;  Laterality: Left;   ORIF TIBIA PLATEAU Right 11/20/2021   Procedure: OPEN REDUCTION INTERNAL FIXATION (ORIF) TIBIAL PLATEAU;  Surgeon:  [name], MD;  Location: [facility];  Service: Orthopedics;  Laterality: Right;    Current Medications: No outpatient medications have been marked as taking for the 01/15/22 encounter (Appointment) with Blake Margarita, MD.    Allergies:   Patient has no known allergies.   Social History   Socioeconomic History   Marital status: Married    Spouse name: Blake Santana   Number of children: 1   Years of education: 9   Highest education level: Not on file  Occupational History   Occupation: Curator  Tobacco Use   Smoking status: Former    Types: Cigarettes    Passive exposure: Past   Smokeless tobacco: Never  Vaping Use   Vaping Use: Never used  Substance and Sexual Activity   Alcohol use: Not Currently   Drug use: No   Sexual activity: Not on file  Other Topics Concern   Not on file  Social History Narrative   Originally from [place]   Came to [place]. In [place] with his girlfriend and their son.   Social Determinants of Health   Financial Resource Strain: Not on file  Food Insecurity: Not on file  Transportation Needs: Not on file  Physical Activity: Not on file  Stress: Not on file  Social Connections: Not on file     Family History:  The patient's family history includes Alcohol abuse in his father; Diabetes in his father, mother, and sister; Hypertension in his mother; Kidney disease in his father and mother.   ROS:   Please see the history of present illness.    ROS All other systems reviewed and are negative.      No data to display             PHYSICAL EXAM:   VS:  There were no vitals taken for this visit.   GEN: Well nourished, well developed, in no acute distress  HEENT: normal  Neck: no JVD, carotid bruits, or masses Cardiac: RRR; no murmurs, rubs, or gallops,no edema.  Intact distal pulses bilaterally.  Respiratory:  clear to auscultation bilaterally, normal work of breathing GI: soft, nontender, nondistended, + BS MS: no deformity or  atrophy  Skin: warm and dry, no rash Neuro:  Alert and Oriented x 3, Strength and sensation are intact Psych: euthymic mood, full affect  Wt Readings from Last 3 Encounters:  11/27/21 148 lb 13 oz (67.5 kg)  06/02/21 141 lb 1.5 oz (64 kg)  04/14/21 138 lb 14.2 oz (63 kg)      Studies/Labs Reviewed:   EKG:  EKG is ordered today.  The ekg ordered today demonstrates ***  Recent Labs: 11/17/2021: ALT 16 11/27/2021: BUN 69; Creatinine, Ser 9.38; Hemoglobin 8.7; Platelets 230; Potassium 4.5; Sodium 131   Lipid Panel    Component Value Date/Time   CHOL 180 01/15/2021 1223   TRIG 124 01/15/2021 1223   HDL 41 01/15/2021 1223   CHOLHDL 4.4 01/15/2021 1223   CHOLHDL 3.3 11/22/2018 0617   VLDL 16 11/22/2018 0617   LDLCALC 117 (H) 01/15/2021 1223     Additional studies/  records that were reviewed today include:  Cardiac cath from 2020    ASSESSMENT:    1. Coronary artery disease due to lipid rich plaque   2. Hypertension associated with diabetes ([place])   3. Pure hypercholesterolemia      PLAN:  In order of problems listed above:  ASCAD -Cardiac cath done 2020 for chest pain and minimally elevated troponin showed minimal nonobstructive CAD with 10% diagonal and LAD disease -He has not had any further anginal symptoms -Continue prescription drug management with statin therapy and Lopressor 12.5 mg twice daily  2.  Hyperlipidemia -LDL goal less than 70 -Check FLP and ALT -Continue prescription drug management with Crestor 20 mg daily with as needed refills  3.  Hypertension -BP is adequately controlled on exam -Continue prescription drug management with amlodipine 5 mg daily, Lopressor 12.5 mg twice daily with as needed refills  Time Spent: 20 minutes total time of encounter, including 15 minutes spent in face-to-face patient care on the date of this encounter. This time includes coordination of care and counseling regarding above mentioned problem list. Remainder of  non-face-to-face time involved reviewing chart documents/testing relevant to the patient encounter and documentation in the medical record. I have independently reviewed documentation from referring provider  Medication Adjustments/Labs and Tests Ordered: Current medicines are reviewed at length with the patient today.  Concerns regarding medicines are outlined above.  Medication changes, Labs and Tests ordered today are listed in the Patient Instructions below.  There are no Patient Instructions on file for this visit.   Signed, Blake Him, MD  01/15/2022 2:44 PM    [address], [place], [place]  [address] Phone: [phone]; Fax: [phone]

## 2022-02-20 ENCOUNTER — Ambulatory Visit: Payer: No Typology Code available for payment source | Admitting: Cardiology

## 2022-02-20 ENCOUNTER — Encounter (HOSPITAL_COMMUNITY): Payer: Self-pay

## 2022-02-20 ENCOUNTER — Encounter: Payer: Self-pay | Admitting: Cardiology

## 2022-02-20 VITALS — BP 160/98 | HR 76 | Ht 64.0 in | Wt 134.0 lb

## 2022-02-20 DIAGNOSIS — I5032 Chronic diastolic (congestive) heart failure: Secondary | ICD-10-CM | POA: Insufficient documentation

## 2022-02-20 DIAGNOSIS — E7800 Pure hypercholesterolemia, unspecified: Secondary | ICD-10-CM

## 2022-02-20 DIAGNOSIS — E1159 Type 2 diabetes mellitus with other circulatory complications: Secondary | ICD-10-CM

## 2022-02-20 DIAGNOSIS — I152 Hypertension secondary to endocrine disorders: Secondary | ICD-10-CM

## 2022-02-20 LAB — LIPID PANEL
Chol/HDL Ratio: 4.8 ratio (ref 0.0–5.0)
Cholesterol, Total: 197 mg/dL (ref 100–199)
HDL: 41 mg/dL (ref >39)
LDL Chol Calc (NIH): 138 mg/dL — ABNORMAL HIGH (ref 0–99)
Triglycerides: 100 mg/dL (ref 0–149)
VLDL Cholesterol Cal: 18 mg/dL (ref 5–40)

## 2022-02-20 LAB — ALT: ALT: 8 IU/L (ref 0–44)

## 2022-02-20 MED ORDER — CARVEDILOL 6.25 MG PO TABS: 6.2500 mg | ORAL_TABLET | Freq: Two times a day (BID) | ORAL | 3 refills | Status: DC

## 2022-02-20 NOTE — Patient Instructions (Signed)
Purchase compression  hose and start wearing these daily   Medication Instructions:  Your physician has recommended you make the following change in your medication:  1) STOP taking Lopressor (metoprolol) 2) START taking Coreg (carvedilol) 6.25 mg twice daily  *If you need a refill on your cardiac medications before your next appointment, please call your pharmacy*   Lab Work: TODAY: Fasting lipids and ALT If you have labs (blood work) drawn today and your tests are completely normal, you will receive your results only by: [place] (if you have MyChart) OR A paper copy in the mail If you have any lab test that is abnormal or we need to change your treatment, we will call you to review the results.  Follow-Up: At [facility], you and your health needs are our priority.  As part of our continuing mission to provide you with exceptional heart care, we have created designated Provider Care Teams.  These Care Teams include your primary Cardiologist (physician) and Advanced Practice Providers (APPs -  Physician Assistants and Nurse Practitioners) who all work together to provide you with the care you need, when you need it.  You have been referred to see PharmD in Hypertension Clinic  Your next appointment:   6 month(s)  The format for your next appointment:   In Person  Provider:   [name], MD     Other Instructions  Plan de alimentacin con bajo contenido de sodio Low-Sodium Eating Plan El sodio, que es uno de los elementos que constituyen la sal, ayuda a Theatre manager un equilibrio saludable de los fluidos corporales. Mucha cantidad de sodio puede aumentar la presin arterial y Field seismologist que el organismo retenga lquidos y residuos. El mdico o el nutricionista podran recomendarle que siga este plan si tiene presin arterial alta (hipertensin), enfermedad renal, enfermedad heptica o insuficiencia cardaca. El consumo de una menor cantidad de sodio puede ayudar a [employer] presin arterial, reducir la hinchazn y Tour manager, el hgado y los riones. Consejos para seguir Photographer las etiquetas de los alimentos La etiqueta de informacin nutricional indica la cantidad de sodio en una porcin de alimento. Si come ms de una porcin, debe multiplicar la cantidad indicada de sodio por la cantidad de porciones. Elija alimentos con menos de 140 mg de sodio por porcin. Evite consumir alimentos que tengan 300 mg de sodio o ms por porcin. Al ir de compras  Busque productos con bajo contenido de sodio, en cuyas etiquetas suele indicarse "bajo contenido de sodio" o "sin agregado de sal". Siempre controle el contenido de sodio, incluso si en la etiqueta de los alimentos dice "sin sal" o "sin agregado de sal". Compre alimentos frescos. Evite los alimentos enlatados y comidas precocidas o congeladas. Evite las carnes enlatadas, curadas o procesadas. Compre panes que tengan menos de 80 mg de sodio por rebanada. Al cocinar  Consuma ms comida casera y menos de restaurante, de bares y comida rpida. Evite agregar sal cuando cocine. Use hierbas o aderezos sin sal, en lugar de sal de mesa o sal marina. Consulte al mdico o farmacutico antes de usar sustitutos de la sal. Cocine con aceites de origen vegetal, como el de canola, girasol u [place]. Planificacin de las comidas Cuando coma en un restaurante, pida que preparen su comida con menos sal o, en lo posible, sin nada de sal. Evite los platos etiquetados como en salmuera, encurtidos, curados, ahumados o hechos con salsa de soja, miso o salsa teriyaki. Evite consumir alimentos  que contengan GMS (glutamato monosdico). El GMS suele agregarse a la comida [place], al consom y a algunos alimentos enlatados. Prepare comidas que puedan ser [place], [place], hervidas, asadas o al vapor. Generalmente, se elaboran con menos sodio. Informacin general [employer] de las personas que sigan este plan deben limitar la  ingesta de sodio a entre 1500 y 2000 mg (miligramos) por Training and development officer. Qu alimentos debo comer? Lambert Mody Frutas frescas, congeladas o enlatadas. Jugo de frutas. Verduras Verduras frescas o congeladas. Verduras enlatadas "sin agregado de sal". Salsa de tomate y pastas "sin agregado de sal". Jugos de tomate y verduras con bajo contenido de sodio o reducidos en sodio. Granos Cereales con bajo contenido de [place], [place], arroz y trigo inflados, y trigo triturado. Galletas con bajo contenido de [place]. Arroz sin sal. Pastas sin sal. Pan con bajo contenido de sodio. Panes y pastas integrales. Carnes y otras protenas Carne de vaca o ave, pescado y mariscos frescos o congelados (sin agregado de sal). Atn y salmn enlatado con bajo contenido de sodio. Frutos secos sin sal. Lentejas, frijoles y guisantes secos, sin agregado de sal. Frijoles enlatados sin sal. Huevos. Mantequillas de frutos secos sin sal. Lcteos Leche. Leche de soja. Quesos que, por naturaleza, tengan bajo contenido de sodio, por ejemplo, la ricota, la mozzarella fresca o el queso suizo. Quesos con contenido bajo o reducido de sodio. Queso crema. Yogur. Alios y condimentos Hierbas y especias frescas y secas. Aderezos sin sal. Mostaza y ktchup con bajo contenido de [place]. Aderezos para ensalada sin sodio. Mayonesa light sin sodio. Rbano picante fresco o refrigerado. Jugo de limn. Vinagre. Otros alimentos [place]opas caseras o con contenido de sodio bajo o reducido. Palomitas de maz y pretzels sin sal. Papas fritas sin sal o con bajo contenido de sal. Es posible que los productos que se enumeran ms [place] no constituyan una lista completa de los alimentos y las bebidas que puede tomar. Consulte a un nutricionista para obtener ms informacin. Qu alimentos debo evitar? Verduras Chucrut, verduras en escabeche y salsas. Aceitunas. Papas fritas. Anillos de cebollas. Verduras enlatadas regulares (que no sean con bajo contenido de sodio o reducidas  en sodio). Pasta y salsa de tomates enlatadas regulares (que no sean con bajo contenido de sodio o reducidas en sodio). Jugos de tomate y verduras regulares (que no sean con bajo contenido de sodio o reducidos en sodio). Verduras Surveyor, minerals. Granos Cereales instantneos para comer calientes. Mezclas para bizcochos, panqueques y rellenos de pan. Crutones. Mezclas para pastas o arroz con condimento. Envases comerciales de sopa de fideos. Macarrones con queso envasados o congelados. Galletas saladas comunes. Harina leudante. Carnes y otras protenas Carne de vaca o pescado que est salada, [place], [place], condimentada o en escabeche. Carne precocinada o curada, como embutidos o panes de carne. Tocino. Jamn. Pepperoni. Perros calientes. Carne en conserva. Carne picada de buey. Cerdo salado. Cecina o charqui. Arenque en escabeche. Anchoas y sardinas. Atn enlatado comn. Frutos secos con sal. Ines Bloomer para untar y quesos procesados. Quesos duros. Requesn. Queso azul. Queso feta. Queso en hebras. [place] comn. Suero de [place]. Leche enlatada. Grasas y aceites Mantequilla con sal. Margarina comn. Mantequilla clarificada. Grasa de panceta. Alios y condimentos Sal de cebolla, sal de ajo, sal condimentada, sal de mesa y sal marina. Salsas en lata y envasadas. Salsa Worcestershire. Salsa trtara. Salsa barbacoa. Salsa teriyaki. Salsa de soja, incluso la que tiene contenido reducido de [place]. Salsa de carne. Salsa de pescado. Salsa de [place]. Salsa rosada. Rbano picante  envasado. [place]up y [employer]. Saborizantes y tiernizantes para carne. Caldo en cubitos. Salsa picante. Adobos preelaborados o envasados. Aderezos para tacos preelaborados o envasados. Salsas. Aderezos comunes para ensalada. Salsa. Otros alimentos Palomitas de maz y pretzels con sal. Maz inflado y frituras de maz. Nachos y papas fritas envasadas. Sopas enlatadas o en polvo. Pizza. Pasteles y entradas  congeladas. Es posible que los productos que se enumeran ms [place] no constituyan una lista completa de los alimentos y las bebidas que Nurse, adult. Consulte a un nutricionista para obtener ms informacin. Resumen El consumo de una menor cantidad de sodio puede ayudar a [employer] presin arterial, reducir la hinchazn y Tour manager, el hgado y los riones. La [employer] de las personas que sigan este plan deben limitar la ingesta de sodio a entre 1500 y 2000 mg (miligramos) por Training and development officer. Los [employer], envasados y congelados tienen alto contenido de [place]. La pizza, la comida rpida y la comida de los restaurantes tambin contienen mucho sodio. Tambin aporta sodio al agregar sal a las comidas. Intente cocinar en su hogar, coma ms frutas y verduras frescas, y coma menos comidas rpidas y alimentos enlatados, procesados o preparados. Esta informacin no tiene Marine scientist el consejo del mdico. Asegrese de hacerle al mdico cualquier pregunta que tenga. Document Revised: 08/25/2019 Document Reviewed: 08/25/2019 Elsevier Patient Education  [place].

## 2022-02-20 NOTE — Addendum Note (Signed)
Addended by: Antonieta Iba on: 02/20/2022 09:51 AM   Modules accepted: Orders

## 2022-02-20 NOTE — Progress Notes (Signed)
Cardiology CONSULT Note    Date:  02/20/2022   ID:  Santana, Blake 1971/07/13, MRN [id number]  PCP:  [name], NP  Cardiologist:  [name], MD   Chief Complaint  Patient presents with   New Patient (Initial Visit)    Hypertension    History of Present Illness:  Blake Santana is a 51 y.o. male who is being seen today for the evaluation of hypertension at the request of [name] Blake Poag, DO.  This is a 51 year old African-American male with a history of end-stage renal disease on HD, diabetes complicated by diabetic nephropathy and retinopathy, hyperlipidemia, hypertension, chronic diastolic CHF and AF CAD status postcardiac cath in 2020 demonstrating no significant CAD with 10% mid LAD and 10% ostial D2 stenosis.  He is now referred for evaluation of poorly controlled hypertension.    He is here today for followup and is doing well.  Visit is dong through an interpreter.  He rarely has some sharp stabbing pains in his chest at times but no exertional symptoms.  He has issues with SOB when he has too much volume on board before HD. He has a lot of problems with LE edema. He admits to salting his food some.  He denies any PND, orthopnea, dizziness, palpitations or syncope. He is compliant with his meds and is tolerating meds with no SE.     Past Medical History:  Diagnosis Date   Acute respiratory failure with hypoxia ([place]) 09/07/2018   Anemia of chronic kidney failure 03/11/2017   Cataracts, bilateral 2019   Chest pain 09/01/2018   CHF (congestive heart failure) ([place])    Chronic kidney disease 03/11/2017   Diabetes mellitus without complication ([place]) 1443   Edema 01/14/2017   Headache    Hypercholesterolemia    Hypertension 03/11/2017   Hypertensive retinopathy of both eyes 2019   Iron deficiency anemia 06/18/2016   Pneumonia 09/07/2018   Proliferative diabetic retinopathy with macular edema ([name]) 2019   Retinal hemorrhage, bilateral and left  vitreous hemorrhage.  Being treated with Lucentis.  Dr. Iona Santana   RSV (respiratory syncytial virus infection) 08/2017    Past Surgical History:  Procedure Laterality Date   A/V FISTULAGRAM Left 01/19/2018   Procedure: A/V FISTULAGRAM - Left AV;  Surgeon: Blake Sandy, MD;  Location: [place] [facility];  Service: Cardiovascular;  Laterality: Left;   AV FISTULA PLACEMENT Left 08/09/2017   Procedure: LEFT RADIOCEPHALIC ARTERIOVENOUS (AV) FISTULA CREATION;  Surgeon: Blake Posner, MD;  Location: [facility];  Service: Vascular;  Laterality: Left;   AV FISTULA PLACEMENT Left 02/02/2018   Procedure: ARTERIOVENOUS (AV) FISTULA CREATION LEFT UPPER EXTREMITY;  Surgeon: Blake Posner, MD;  Location: [place];  Service: Vascular;  Laterality: Left;   INSERTION OF DIALYSIS CATHETER  08/09/2017   INSERTION OF DIALYSIS CATHETER Right 08/09/2017   Procedure: INSERTION OF DIALYSIS CATHETER RIGHT INTERNAL JUGULAR;  Surgeon: Blake Posner, MD;  Location: [facility];  Service: Vascular;  Laterality: Right;   IR DIALY SHUNT INTRO NEEDLE/INTRACATH INITIAL W/IMG LEFT Left 07/10/2020   IR DIALY SHUNT INTRO NEEDLE/INTRACATH INITIAL W/IMG LEFT Left 04/14/2021   IR REMOVAL TUN CV CATH W/O FL  07/01/2018   LEFT HEART CATH AND CORONARY ANGIOGRAPHY N/A 09/05/2018   Procedure: LEFT HEART CATH AND CORONARY ANGIOGRAPHY;  Surgeon: Blake Booze, MD;  Location: [place] [facility];  Service: Cardiovascular;  Laterality: N/A;   OPEN REDUCTION INTERNAL FIXATION (ORIF) DISTAL RADIAL FRACTURE Left 11/20/2021  Procedure: POSSIBLE OPEN REDUCTION INTERNAL FIXATION (ORIF) DISTAL RADIAL FRACTURE;  Surgeon: Blake Needles, MD;  Location: [place];  Service: Orthopedics;  Laterality: Left;   ORIF ANKLE FRACTURE Left 11/20/2021   Procedure: OPEN REDUCTION INTERNAL FIXATION (ORIF) ANKLE PILON FRACTURE;  Surgeon: Blake Needles, MD;  Location: [place];  Service: Orthopedics;  Laterality: Left;   ORIF TIBIA PLATEAU Right 11/20/2021   Procedure: OPEN  REDUCTION INTERNAL FIXATION (ORIF) TIBIAL PLATEAU;  Surgeon: Blake Needles, MD;  Location: [place];  Service: Orthopedics;  Laterality: Right;    Current Medications: Current Meds  Medication Sig   acetaminophen (TYLENOL) 500 MG tablet Take 1,000 mg by mouth every 6 (six) hours as needed for moderate pain or headache.   amLODipine (NORVASC) 5 MG tablet Take 1 tablet by mouth once daily with breakfast (Patient taking differently: Take 5 mg by mouth daily.)   aspirin-acetaminophen-caffeine (EXCEDRIN MIGRAINE) 250-250-65 MG tablet Take 1-2 tablets by mouth every 6 (six) hours as needed for headache.   b complex-vitamin c-folic acid (NEPHRO-VITE) 0.8 MG TABS tablet Take 1 tablet by mouth Every Tuesday,Thursday,and Saturday with dialysis.    cinacalcet (SENSIPAR) 60 MG tablet Take 60 mg by mouth daily.   lanthanum (FOSRENOL) 1000 MG chewable tablet Chew 2,000 mg by mouth 3 (three) times daily with meals.   lidocaine-prilocaine (EMLA) cream Apply 1 application topically 3 (three) times a week. Use at dialysis   loperamide (IMODIUM A-D) 2 MG tablet Take 2 mg by mouth 4 (four) times daily as needed for diarrhea or loose stools.   Methoxy PEG-Epoetin Beta (MIRCERA IJ) Mircera   metoprolol tartrate (LOPRESSOR) 25 MG tablet TAKE 1/2 (ONE-HALF) TABLET BY MOUTH TWICE DAILY WITH MEALS (Patient taking differently: Take 12.5 mg by mouth 2 (two) times daily.)   Oxycodone HCl 10 MG TABS Take 10 mg by mouth every 4 (four) hours as needed.   rosuvastatin (CRESTOR) 20 MG tablet Take 1 tablet (20 mg total) by mouth daily at 6 PM.   Vitamin D3 (VITAMIN D) 25 MCG tablet Take 2 tablets (2,000 Units total) by mouth daily.    Allergies:   Patient has no known allergies.   Social History   Socioeconomic History   Marital status: Married    Spouse name: Blake Santana   Number of children: 1   Years of education: 9   Highest education level: Not on file  Occupational History   Occupation: Curator  Tobacco Use    Smoking status: Former    Types: Cigarettes    Passive exposure: Past   Smokeless tobacco: Never  Vaping Use   Vaping Use: Never used  Substance and Sexual Activity   Alcohol use: Not Currently   Drug use: No   Sexual activity: Not on file  Other Topics Concern   Not on file  Social History Narrative   Originally from [place]   Came to [place]. In [place] with his girlfriend and their son.   Social Determinants of Health   Financial Resource Strain: Not on file  Food Insecurity: Not on file  Transportation Needs: Not on file  Physical Activity: Not on file  Stress: Not on file  Social Connections: Not on file     Family History:  The patient's family history includes Alcohol abuse in his father; Diabetes in his father, mother, and sister; Hypertension in his mother; Kidney disease in his father and mother.   ROS:   Please see the history of present illness.  ROS All other systems reviewed and are negative.      No data to display             PHYSICAL EXAM:   VS:  BP (!) 160/98   Pulse 76   Ht '5\' 4"'$  (1.626 m)   Wt 134 lb (60.8 kg)   BMI 23.00 kg/m    GEN: Well nourished, well developed, in no acute distress  HEENT: normal  Neck: no JVD, carotid bruits, or masses Cardiac: RRR; no murmurs, rubs, or gallops,no edema.  Intact distal pulses bilaterally.  Respiratory:  clear to auscultation bilaterally, normal work of breathing GI: soft, nontender, nondistended, + BS MS: no deformity or atrophy  Skin: warm and dry, no rash Neuro:  Alert and Oriented x 3, Strength and sensation are intact Psych: euthymic mood, full affect  Wt Readings from Last 3 Encounters:  02/20/22 134 lb (60.8 kg)  11/27/21 148 lb 13 oz (67.5 kg)  06/02/21 141 lb 1.5 oz (64 kg)      Studies/Labs Reviewed:   EKG:  EKG is ordered today.  The ekg ordered today demonstrates NSR with LAE, LAD and lateral T wave abnormality with LVH secondary to replarization abnormality  Recent  Labs: 11/17/2021: ALT 16 11/27/2021: BUN 69; Creatinine, Ser 9.38; Hemoglobin 8.7; Platelets 230; Potassium 4.5; Sodium 131   Lipid Panel    Component Value Date/Time   CHOL 180 01/15/2021 1223   TRIG 124 01/15/2021 1223   HDL 41 01/15/2021 1223   CHOLHDL 4.4 01/15/2021 1223   CHOLHDL 3.3 11/22/2018 0617   VLDL 16 11/22/2018 0617   LDLCALC 117 (H) 01/15/2021 1223    Additional studies/ records that were reviewed today include:  Cardiac cath, 2D echo, prior hospital notes from 2020    ASSESSMENT:    1. Hypertension associated with diabetes ([place])   2. Pure hypercholesterolemia   3. Chronic diastolic heart failure ([place])   4. Chronic diastolic CHF (congestive heart failure) ([facility])      PLAN:  In order of problems listed above:  Hypertension -BP is elevated on exam today -continue prescription drug management with Amlodipine '5mg'$  daily -change lopressor to Carvedilol 6.'5mg'$  BID -will get 48 hour BP monitor after med changes  2.  Hyperlipidemia -LDL goal less than 70 -Check FLP and ALT -Continue prescription drug management with rosuvastatin 20 mg daily with as needed refills  3.  Chronic diastolic CHF -He has problems with LE edema and SOB right before he is due to get HD -encouraged him to avoid salting his food and follow < 2gm Na renal diet -Volume management per nephrology on HD  4.  Chronic LE edema -related to ESRD and chronic diastolic CHF  -he has no LE edema today -add compression hose  Followup in Pharm D HTN clinic in 2 weeks and with me in 6 months  Time Spent: 20 minutes total time of encounter, including 15 minutes spent in face-to-face patient care on the date of this encounter. This time includes coordination of care and counseling regarding above mentioned problem list. Remainder of non-face-to-face time involved reviewing chart documents/testing relevant to the patient encounter and documentation in the medical record. I have independently reviewed  documentation from referring provider  Medication Adjustments/Labs and Tests Ordered: Current medicines are reviewed at length with the patient today.  Concerns regarding medicines are outlined above.  Medication changes, Labs and Tests ordered today are listed in the Patient Instructions below.  There are no Patient Instructions on file  for this visit.   Signed, [name], MD  02/20/2022 9:38 AM    [address], [place], [place]  [address] Phone: [phone]; Fax: [phone]

## 2022-02-25 ENCOUNTER — Telehealth: Payer: Self-pay

## 2022-02-25 DIAGNOSIS — E7800 Pure hypercholesterolemia, unspecified: Secondary | ICD-10-CM

## 2022-02-25 MED ORDER — ROSUVASTATIN CALCIUM 40 MG PO TABS: 40.0000 mg | ORAL_TABLET | Freq: Every day | ORAL | 3 refills | Status: DC

## 2022-02-25 NOTE — Telephone Encounter (Signed)
-----   Message from Sueanne Margarita, MD sent at 02/22/2022  9:14 PM EDT ----- LDL not at goal - increase Crestor to '40mg'$  daily and check FLP and ALT in 6 weeks

## 2022-02-25 NOTE — Telephone Encounter (Signed)
The patient has been notified of the result through an interpretor and verbalized understanding.  All questions (if any) were answered. Antonieta Iba, RN 02/25/2022 3:36 PM  New prescription has been sent in and labs have been scheduled.

## 2022-03-12 NOTE — Progress Notes (Unsigned)
Patient ID: Blake Santana                 DOB: 05-19-1971                      MRN: [id number]     HPI: Blake Santana is a 51 y.o. male referred by Dr. Radford Pax to HTN clinic. PMH is significant for HTN, CAD, NSTEMI in 2020 (cath showed 10% mid LAD and 10% ostial D2 stenosis), ESRD on HD TTS, T2DM with diabetic nephropathy and retinopathy, HLD, chronic diastolic CHF, AF. Pt had recent admission back in April for trauma due to fall off ladder from 20 foot height. He was referred to Dr. Radford Pax to follow up for hypertension management after discharge. At visit with Dr. Radford Pax on 02/20/22, patient shared he rarely has sharp stabbing chest pain but no exertional symptoms. He also reported SOB when he is volume overloaded and LE edema before dialysis. BP at this visit was 160/98 and HR 76, Lopressor was switched to carvedilol for better BP lowering and amlodipine 5 mg daily was continued. She also recommended a 48 hour BP monitor and compression stockings for LE edema. Patient was also increased from 20 mg to rosuvastatin 40 mg daily given LDL of 138.  Patient presents to HTN clinic with son and interpretor present. Patient shared that he is not experiencing dizziness but he has been having some headaches. Patient reports some swelling in his feet but says this has been going on for a long time.  The swelling mainly presents right before dialysis due to fluid retention. He is also experiencing numbness and pain in his feet due to diabetic neuropathy. Patient has a BP monitor at home but has not checked it in over a month, reports he forgets because he goes to dialysis. He shares that he picked up rosuvastatin 1 week ago and carvedilol 2 weeks ago, but has only been taking the carvedilol once a day instead of twice a day. The patient has stopped taking metoprolol since starting carvedilol. The patient also stopped taking amlodipine 2 weeks ago due to confusion. He shares that when he is at dialysis  he is told that his BP is high but it gets lower throughout the dialysis session. He endorses taking his medications every day. Patient shares that he has started reducing added salt to his food, and his wife mainly cooks home cooked meals for him. He only has one cup of coffee per day.   Current HTN meds: amlodipine 5 mg (has not been taking), carvedilol 6.25 mg BID (has only been taking once daily) Previously tried: lopressor 12.5 mg BID  BP goal: <130/80  Family History: father- DM, kidney disease , mother- HTN, DM, kidney disease   Social History: former smoker, sometimes drinks beer 3-4 beers on weekends  Diet:  Starting reducing salt to food Breakfast- egg, with ham or bacon and one cup of coffee Lunch - homemade soup, beans, greens Dinner- quesadillas with cheese or chicken  Sprite, water (8 oz)   Wt Readings from Last 3 Encounters:  02/20/22 134 lb (60.8 kg)  11/27/21 148 lb 13 oz (67.5 kg)  06/02/21 141 lb 1.5 oz (64 kg)   BP Readings from Last 3 Encounters:  02/20/22 (!) 160/98  12/10/21 138/80  11/27/21 (!) 152/71   Pulse Readings from Last 3 Encounters:  02/20/22 76  12/10/21 71  11/27/21 66    Renal function: CrCl cannot be calculated (Patient's most  recent lab result is older than the maximum 21 days allowed.).  Past Medical History:  Diagnosis Date   Acute respiratory failure with hypoxia ([place]) 09/07/2018   Anemia of chronic kidney failure 03/11/2017   Cataracts, bilateral 2019   Chest pain 09/01/2018   minimal luminal irregularities   Chronic diastolic CHF (congestive heart failure) ([place])    Chronic kidney disease 03/11/2017   Diabetes mellitus without complication ([place]) 5597   Edema 01/14/2017   Headache    Hypercholesterolemia    Hypertension 03/11/2017   Hypertensive retinopathy of both eyes 2019   Iron deficiency anemia 06/18/2016   Pneumonia 09/07/2018   Proliferative diabetic retinopathy with macular edema ([place]) 2019   Retinal hemorrhage,  bilateral and left vitreous hemorrhage.  Being treated with Lucentis.  Dr. Iona Hansen   RSV (respiratory syncytial virus infection) 08/2017    Current Outpatient Medications on File Prior to Visit  Medication Sig Dispense Refill   acetaminophen (TYLENOL) 500 MG tablet Take 1,000 mg by mouth every 6 (six) hours as needed for moderate pain or headache.     AgaMatrix Ultra-Thin Lancets MISC Check blood glucose twice daily before meals. 100 each 11   amLODipine (NORVASC) 5 MG tablet Take 1 tablet by mouth once daily with breakfast (Patient taking differently: Take 5 mg by mouth daily.) 90 tablet 3   aspirin-acetaminophen-caffeine (EXCEDRIN MIGRAINE) 250-250-65 MG tablet Take 1-2 tablets by mouth every 6 (six) hours as needed for headache.     b complex-vitamin c-folic acid (NEPHRO-VITE) 0.8 MG TABS tablet Take 1 tablet by mouth Every Tuesday,Thursday,and Saturday with dialysis.      Blood Glucose Monitoring Suppl (AGAMATRIX PRESTO) w/Device KIT Check sugars twice daily 1 kit 0   Blood Glucose Monitoring Suppl (TRUE METRIX METER) w/Device KIT Use as directed 1 kit 0   carvedilol (COREG) 6.25 MG tablet Take 1 tablet (6.25 mg total) by mouth 2 (two) times daily. 180 tablet 3   cinacalcet (SENSIPAR) 60 MG tablet Take 60 mg by mouth daily.     glucose blood (TRUE METRIX BLOOD GLUCOSE TEST) test strip Use as instructed 100 each 12   lanthanum (FOSRENOL) 1000 MG chewable tablet Chew 2,000 mg by mouth 3 (three) times daily with meals.     lidocaine-prilocaine (EMLA) cream Apply 1 application topically 3 (three) times a week. Use at dialysis     loperamide (IMODIUM A-D) 2 MG tablet Take 2 mg by mouth 4 (four) times daily as needed for diarrhea or loose stools.     Methoxy PEG-Epoetin Beta (MIRCERA IJ) Mircera     Oxycodone HCl 10 MG TABS Take 10 mg by mouth every 4 (four) hours as needed.     rosuvastatin (CRESTOR) 40 MG tablet Take 1 tablet (40 mg total) by mouth daily. 90 tablet 3   TRUEplus Lancets 28G MISC  Use as directed 100 each 4   No current facility-administered medications on file prior to visit.    No Known Allergies  There were no vitals taken for this visit.   Assessment/Plan:  1. Hypertension - Blood pressure of 170/98 is above goal of <130/80. This is likely due to the patient not taking amlodipine and only taking carvedilol once a day. Will have patient start taking carvedilol 6.25 mg BID and amlodipine 5 mg daily as previously prescribed. Suggested taking carvedilol at 8 am and 8 pm per patient preference. While oral fluid intake is limited with dialysis, recommended that when patient is drinking to try and drink more water  and less soda. Will have patient return for follow up on 9/11 to assess BP. Patient is also scheduled for fasting lipid labs on 9/11. Discussed limiting dietary sodium. Pt advised to bring BP cuff and readings to next office visit.    Eliseo Gum, PharmD PGY1 Pharmacy Resident  03/13/2022  11:18 AM   Megan E. Supple, PharmD, BCACP, [place] [address] [place], [place] [address] Phone: [phone]; Fax: [phone] 03/13/2022 12:17 PM

## 2022-03-13 ENCOUNTER — Ambulatory Visit: Payer: No Typology Code available for payment source | Admitting: Pharmacist

## 2022-03-13 VITALS — BP 170/98 | HR 72

## 2022-03-13 DIAGNOSIS — I152 Hypertension secondary to endocrine disorders: Secondary | ICD-10-CM

## 2022-03-13 DIAGNOSIS — E1159 Type 2 diabetes mellitus with other circulatory complications: Secondary | ICD-10-CM

## 2022-03-13 MED ORDER — AMLODIPINE BESYLATE 5 MG PO TABS: 5.0000 mg | ORAL_TABLET | Freq: Every day | ORAL | 3 refills | Status: DC

## 2022-03-13 NOTE — Patient Instructions (Signed)
Reinicie amlodipina 5 mg - 1 tableta una vez al da  El carvedilol debe tomarse dos veces al da con dosis separadas por 12 horas a las 8 a.m. y a las 8 p.m.  No debe tomar metoprolol  Chequear su presin arterial en casa y traiga su registro de lecturas y aparato a su prxima visita    Restart amlodipine '5mg'$  - 1 tablet once a day  Carvedilol should be taken twice a day with doses spaced apart by 12 hours at 8am and 8pm  You should not be taking metoprolol  Check your blood pressure at home and bring in your log of readings and cuff to your next visit

## 2022-04-07 ENCOUNTER — Other Ambulatory Visit (HOSPITAL_COMMUNITY): Payer: Self-pay | Admitting: Student

## 2022-04-07 DIAGNOSIS — R0989 Other specified symptoms and signs involving the circulatory and respiratory systems: Secondary | ICD-10-CM

## 2022-04-08 ENCOUNTER — Ambulatory Visit (HOSPITAL_COMMUNITY): Admission: RE | Admit: 2022-04-08 | Payer: Self-pay | Source: Ambulatory Visit

## 2022-04-08 ENCOUNTER — Encounter (HOSPITAL_COMMUNITY): Payer: Self-pay

## 2022-04-13 ENCOUNTER — Ambulatory Visit: Payer: No Typology Code available for payment source | Attending: Cardiology

## 2022-04-13 ENCOUNTER — Ambulatory Visit (INDEPENDENT_AMBULATORY_CARE_PROVIDER_SITE_OTHER): Payer: No Typology Code available for payment source | Admitting: Pharmacist

## 2022-04-13 VITALS — BP 190/92 | HR 75

## 2022-04-13 DIAGNOSIS — E7800 Pure hypercholesterolemia, unspecified: Secondary | ICD-10-CM

## 2022-04-13 DIAGNOSIS — I152 Hypertension secondary to endocrine disorders: Secondary | ICD-10-CM

## 2022-04-13 DIAGNOSIS — E1159 Type 2 diabetes mellitus with other circulatory complications: Secondary | ICD-10-CM

## 2022-04-13 MED ORDER — CARVEDILOL 12.5 MG PO TABS: 12.5000 mg | ORAL_TABLET | Freq: Two times a day (BID) | ORAL | 3 refills | Status: DC

## 2022-04-13 MED ORDER — AMLODIPINE BESYLATE 10 MG PO TABS: 10.0000 mg | ORAL_TABLET | Freq: Every day | ORAL | 3 refills | Status: DC

## 2022-04-13 NOTE — Patient Instructions (Addendum)
Por favor comience a tomar amlodipino 10 mg al da. Puede tomar 2 de los comprimidos de 5 mg hasta que se acaben. Luego comience a tomar 1 de los comprimidos de 10 mg al [employer].  Empiece a tomar carvedilol 12,5 mg [employer]. Puedes tomar 2 de los comprimidos de 6,25 mg [employer] se acaben. Luego comience a tomar 1 de los comprimidos de 12,5 mg [employer].  Contine controlando su presin arterial en casa.  Su objetivo de presin arterial es <130/80  Para controlar su presin en casa necesitar:  1. Sintese en una silla, con los pies apoyados en el suelo y la espalda apoyada. No cruces los tobillos o piernas. 2. Apoye su brazo izquierdo de modo que el brazalete est aproximadamente al nivel del corazn. Si el brazalete se coloca en la parte superior del brazo, luego simplemente relaje el brazo sobre la mesa, el brazo de la silla o su regazo. Si tiene Hyacinth Meeker, nosotros Sugiero Product/process development scientist contra el pecho (piense en ello como comprometerse con la bandera con el brazo equivocado). 3. Coloque el brazalete cmodamente alrededor de su brazo, aproximadamente 1 pulgada por encima de la curva de su codo. El Los cables deben estar dentro de la ranura de su codo. 4. Sintese tranquilamente, con el manguito colocado, durante unos 5 minutos. Despus de eso, presione el botn de encendido durante 5 minutos. botn para iniciar Psychologist, prison and probation services. 5. No hablar ni moverse mientras se realiza Air traffic controller. 6. Registre sus lecturas en una hoja de papel. Aunque la [employer] de las esposas tienen memoria, a menudo es Es ms fcil ver cmo se desarrolla un patrn cuando todos los nmeros estn frente a ti. 7. Puede repetir la lectura despus de 1-3 minutos si se recomienda  Asegrate de que tu vejiga est vaca y no hayas consumido cafena ni tabaco en los ltimos 30 minutos.  Lleve siempre consigo su registro de presin arterial a sus citas. Si no ha trado su monitor para que le revisen la  precisin, trigalo a su prxima cita.  Puede encontrar una lista de manguitos de presin arterial validados (precisos) en validadbp.org

## 2022-04-13 NOTE — Progress Notes (Unsigned)
Patient ID: Blake Santana                 DOB: [birth year]                      MRN: [id number]     HPI: Blake Santana is a 51 y.o. male referred by Dr. Radford Santana to HTN clinic. PMH is significant for HTN, CAD, NSTEMI in 2020 (cath showed 10% mid LAD and 10% ostial D2 stenosis), ESRD on HD TTS, T2DM with diabetic nephropathy and retinopathy, HLD, chronic diastolic CHF, AF. Pt had recent admission back in April for trauma due to fall off ladder from 20 foot height. He was referred to Dr. Radford Santana to follow up for hypertension management after discharge. At visit with Dr. Radford Santana on 02/20/22, patient shared he rarely has sharp stabbing chest pain but no exertional symptoms. He also reported SOB when he is volume overloaded and LE edema before dialysis. BP at this visit was 160/98 and HR 76, Lopressor was switched to carvedilol for better BP lowering and amlodipine 5 mg daily was continued. She also recommended a 48 hour BP monitor and compression stockings for LE edema. Patient was also increased from 20 mg to rosuvastatin 40 mg daily given LDL of 138.  Patient presents to HTN clinic with his wife and interpretor present. Patient reports taking both amlodipine and carvedilol. Maybe once a week he forgets the AM dose of carvedilol. He has been checking blood pressure at home, but has been checking while lying in bed sometimes and not resting. Home blood pressures are 170's-190's/90-100's. He took his medications this AM around 9AM. Drank 1 cup of coffee around 9-9:30. He has pain in his leg from a fracture. Was taking oxycodone, now only taking APAP but it only helps a little with the pain. Also having tooth pain. Has blurred vision. See's MD for shots in his eyes. Had lipid labs drawn this AM, but he was not fasting.  Home cuff: OMRON upper arm- has to use right arm due to fistula Home cuff: 194/99, clinic cuff 190/92   Current HTN meds: amlodipine 5 mg, carvedilol 6.25 mg BID (has only been  taking once daily) Previously tried: lopressor 12.5 mg BID  BP goal: <130/80  Family History: father- DM, kidney disease , mother- HTN, DM, kidney disease   Social History: former smoker, sometimes drinks beer 3-4 beers on weekends  Diet:  Starting reducing salt to food Breakfast- egg, with ham or bacon and one cup of coffee Lunch - homemade soup, beans, greens Dinner- quesadillas with cheese or chicken  Sprite, water (8 oz)   Home readings: 170's-190's/90-100's  Wt Readings from Last 3 Encounters:  02/20/22 134 lb (60.8 kg)  11/27/21 148 lb 13 oz (67.5 kg)  06/02/21 141 lb 1.5 oz (64 kg)   BP Readings from Last 3 Encounters:  03/13/22 (!) 170/98  02/20/22 (!) 160/98  12/10/21 138/80   Pulse Readings from Last 3 Encounters:  03/13/22 72  02/20/22 76  12/10/21 71    Renal function: CrCl cannot be calculated (Patient's most recent lab result is older than the maximum 21 days allowed.).  Past Medical History:  Diagnosis Date   Acute respiratory failure with hypoxia ([place]) 09/07/2018   Anemia of chronic kidney failure 03/11/2017   Cataracts, bilateral 2019   Chest pain 09/01/2018   minimal luminal irregularities   Chronic diastolic CHF (congestive heart failure) ([place])    Chronic kidney disease 03/11/2017  Diabetes mellitus without complication ([place]) 0539   Edema 01/14/2017   Headache    Hypercholesterolemia    Hypertension 03/11/2017   Hypertensive retinopathy of both eyes 2019   Iron deficiency anemia 06/18/2016   Pneumonia 09/07/2018   Proliferative diabetic retinopathy with macular edema ([place]) 2019   Retinal hemorrhage, bilateral and left vitreous hemorrhage.  Being treated with Lucentis.  Dr. Iona Santana   RSV (respiratory syncytial virus infection) 08/2017    Current Outpatient Medications on File Prior to Visit  Medication Sig Dispense Refill   acetaminophen (TYLENOL) 500 MG tablet Take 1,000 mg by mouth every 6 (six) hours as needed for moderate pain or  headache.     AgaMatrix Ultra-Thin Lancets MISC Check blood glucose twice daily before meals. 100 each 11   amLODipine (NORVASC) 5 MG tablet Take 1 tablet (5 mg total) by mouth daily. 90 tablet 3   aspirin-acetaminophen-caffeine (EXCEDRIN MIGRAINE) 250-250-65 MG tablet Take 1-2 tablets by mouth every 6 (six) hours as needed for headache.     b complex-vitamin c-folic acid (NEPHRO-VITE) 0.8 MG TABS tablet Take 1 tablet by mouth Every Tuesday,Thursday,and Saturday with dialysis.      Blood Glucose Monitoring Suppl (AGAMATRIX PRESTO) w/Device KIT Check sugars twice daily 1 kit 0   Blood Glucose Monitoring Suppl (TRUE METRIX METER) w/Device KIT Use as directed 1 kit 0   carvedilol (COREG) 6.25 MG tablet Take 1 tablet (6.25 mg total) by mouth 2 (two) times daily. 180 tablet 3   cinacalcet (SENSIPAR) 60 MG tablet Take 60 mg by mouth daily.     glucose blood (TRUE METRIX BLOOD GLUCOSE TEST) test strip Use as instructed 100 each 12   lanthanum (FOSRENOL) 1000 MG chewable tablet Chew 2,000 mg by mouth 3 (three) times daily with meals.     lidocaine-prilocaine (EMLA) cream Apply 1 application topically 3 (three) times a week. Use at dialysis     loperamide (IMODIUM A-D) 2 MG tablet Take 2 mg by mouth 4 (four) times daily as needed for diarrhea or loose stools.     Methoxy PEG-Epoetin Beta (MIRCERA IJ) Mircera     Oxycodone HCl 10 MG TABS Take 10 mg by mouth every 4 (four) hours as needed.     rosuvastatin (CRESTOR) 40 MG tablet Take 1 tablet (40 mg total) by mouth daily. 90 tablet 3   TRUEplus Lancets 28G MISC Use as directed 100 each 4   No current facility-administered medications on file prior to visit.    No Known Allergies  There were no vitals taken for this visit.   Assessment/Plan:  1. Hypertension - Blood pressure is significantly elevated in clinic today above goal of <130/80. Will increase amlodipine to 6m daily and carvedilol to 12.554mBID. Reviewed medication changes in detail with  patient and translator. Also reviewed proper technique for checking blood pressure. Continue checking blood pressure at home. Patient and wife advised to call me if no improvement in BP is seen. Their children speak english and [facility]. Patient does not have insurance. Just the orange card. Will reach out to social work to see if their is a free or discounted dental clinic they could see.    MeRamond DialPharm.D, BCPS, CPP [facility]GrUniversity Park[place] [address]Phone: [phone]: [phone]/06/2022 8:32 AM

## 2022-04-14 ENCOUNTER — Telehealth: Payer: Self-pay | Admitting: Pharmacist

## 2022-04-14 ENCOUNTER — Other Ambulatory Visit: Payer: Self-pay | Admitting: Family

## 2022-04-14 ENCOUNTER — Telehealth: Payer: Self-pay

## 2022-04-14 DIAGNOSIS — E7800 Pure hypercholesterolemia, unspecified: Secondary | ICD-10-CM

## 2022-04-14 LAB — LIPID PANEL
Chol/HDL Ratio: 3.6 ratio (ref 0.0–5.0)
Cholesterol, Total: 145 mg/dL (ref 100–199)
HDL: 40 mg/dL (ref >39)
LDL Chol Calc (NIH): 79 mg/dL (ref 0–99)
Triglycerides: 149 mg/dL (ref 0–149)
VLDL Cholesterol Cal: 26 mg/dL (ref 5–40)

## 2022-04-14 LAB — ALT: ALT: 6 IU/L (ref 0–44)

## 2022-04-14 MED ORDER — EZETIMIBE 10 MG PO TABS: 10.0000 mg | ORAL_TABLET | Freq: Every day | ORAL | 3 refills | Status: DC

## 2022-04-14 NOTE — Progress Notes (Signed)
Order complete. 

## 2022-04-14 NOTE — Telephone Encounter (Signed)
Called pt using interrupter and gave him the info below.

## 2022-04-14 NOTE — Telephone Encounter (Signed)
The patient has been notified of the result and verbalized understanding.  All questions (if any) were answered. Antonieta Iba, RN 04/14/2022 1:24 PM  Prescription has been sent in.

## 2022-04-14 NOTE — Telephone Encounter (Signed)
-----   Message from Sueanne Margarita, MD sent at 04/14/2022  1:00 PM EDT ----- Agree  ----- Message ----- From: Ramond Dial, RPH-CPP Sent: 04/14/2022  11:22 AM EDT To: Sueanne Margarita, MD; Antonieta Iba, RN  Patient was not fasting for labs, but regardless his LDL would still be above goal. I would add zetia '10mg'$  daily.

## 2022-04-14 NOTE — Telephone Encounter (Signed)
-----   Message from Diron Ny, Vazquez sent at 04/13/2022  1:06 PM EDT ----- Per the orange card website he should be able to ask his PCP through the orange card for a referral to a dentist so that should also be inexpensive   ----- Message ----- From: Ramond Dial, RPH-CPP Sent: 04/13/2022  12:12 PM EDT To: Valdez Ny, LCSW; Alexander Mt, LCSW  Hello ladies,  I have this gentleman who states that his molars hurt and are lose. Does not see a dentist. He is on the orange card. Is there a free dental clinic or a dental office that would do work for cheap for him?  Thanks for all you do, Parsa Rickett

## 2022-04-15 ENCOUNTER — Ambulatory Visit (HOSPITAL_COMMUNITY)
Admission: RE | Admit: 2022-04-15 | Discharge: 2022-04-15 | Disposition: A | Discharge status: Home / Self Care | Payer: No Typology Code available for payment source | Source: Ambulatory Visit | Attending: Student | Admitting: Student

## 2022-04-15 ENCOUNTER — Ambulatory Visit: Payer: Self-pay | Admitting: *Deleted

## 2022-04-15 DIAGNOSIS — R0989 Other specified symptoms and signs involving the circulatory and respiratory systems: Secondary | ICD-10-CM | POA: Insufficient documentation

## 2022-04-15 NOTE — Progress Notes (Signed)
ABI w/ TBI study completed.   Please see CV Proc for preliminary results.   Ebon Ketchum, RDMS, RVT  

## 2022-04-15 NOTE — Telephone Encounter (Signed)
Pt called in with a Spanish interpreter [phone].

## 2022-04-15 NOTE — Telephone Encounter (Signed)
Reason for Disposition  Toothache present > 24 hours  Answer Assessment - Initial Assessment Questions 1. ONSET: "When did the mouth start hurting?" (e.g., hours or days ago)      Having pain in mouth.   On left side the last molar on the bottom.     Hurting for 3 weeks.  It's very loose.   When it touches the upper molars it hurts very bad.  Has not contacted a dentist.   I was charged a high price for tooth removal.   The cardiologist told me to talk to my PCP about it.    To see if my PCP could help me find a dr who accepts the orange card.  That's why I'm calling.  I need a referral.  My number is [phone].    Another number to try is [phone] if the first number does not work.    2. SEVERITY: "How bad is the pain?" (Scale 1-10; mild, moderate or severe)   - MILD (1-3):  doesn't interfere with eating or normal activities   - MODERATE (4-7): interferes with eating some solids and normal activities   - SEVERE (8-10):  excruciating pain, interferes with most normal activities   - SEVERE DYSPHAGIA: can't swallow liquids, drooling     Moderate pain.   Using Tylenol for the pain.  At the beginning I had swelling but not now.   3. SORES: "Are there any sores or ulcers in the mouth?" If Yes, ask: "What part of the mouth are the sores in?"     No sores or swelling.   Was swollen at first but not now. 4. FEVER: "Do you have a fever?" If Yes, ask: "What is your temperature, how was it measured, and when did it start?"     Not asked 5. CAUSE: "What do you think is causing the mouth pain?"     A bad tooth.   6. OTHER SYMPTOMS: "Do you have any other symptoms?" (e.g., difficulty breathing)     No I had an appt.  When is it    I forgot.  Protocols used: Mouth Pain-A-AH, Toothache-A-AH  Chief Complaint: toothache.   Needs a referral to a dentist that takes the orange card. Symptoms: Pain and looseness of lower molar Frequency: For last 3 weeks Pertinent Negatives: Patient denies having  contacted a dentist. Disposition: '[]'$ ED /'[]'$ Urgent Care (no appt availability in office) / '[]'$ Appointment(In office/virtual)/ '[]'$  [facility]/ '[]'$ Home Care/ '[]'$ Refused Recommended Disposition /'[]'$  Mobile Bus/ '[x]'$  Follow-up with PCP Additional Notes: I sent a message to Durene Fruits, NP regarding a referral to a dentist that accepts the orange card.   Pt agreeable to being called back.   There are 2 numbers listed in the notes in case the first number does not go through.

## 2022-04-15 NOTE — Telephone Encounter (Signed)
There are dentist that pt can be referred to that takes orange card, pt can pick up dental resources paper to give options on where he can go for dental care

## 2022-05-15 ENCOUNTER — Ambulatory Visit: Payer: Self-pay

## 2022-06-03 ENCOUNTER — Ambulatory Visit: Payer: Self-pay | Attending: Cardiology | Admitting: Pharmacist

## 2022-06-03 VITALS — BP 116/67 | HR 62

## 2022-06-03 DIAGNOSIS — E7800 Pure hypercholesterolemia, unspecified: Secondary | ICD-10-CM

## 2022-06-03 DIAGNOSIS — E1159 Type 2 diabetes mellitus with other circulatory complications: Secondary | ICD-10-CM

## 2022-06-03 DIAGNOSIS — I152 Hypertension secondary to endocrine disorders: Secondary | ICD-10-CM

## 2022-06-03 LAB — HEPATIC FUNCTION PANEL
ALT: 7 IU/L (ref 0–44)
AST: 7 IU/L (ref 0–40)
Albumin: 4.2 g/dL (ref 3.8–4.9)
Alkaline Phosphatase: 474 IU/L — ABNORMAL HIGH (ref 44–121)
Bilirubin Total: 0.3 mg/dL (ref 0.0–1.2)
Bilirubin, Direct: 0.12 mg/dL (ref 0.00–0.40)
Total Protein: 7.2 g/dL (ref 6.0–8.5)

## 2022-06-03 LAB — LIPID PANEL
Chol/HDL Ratio: 5.2 ratio — ABNORMAL HIGH (ref 0.0–5.0)
Cholesterol, Total: 176 mg/dL (ref 100–199)
HDL: 34 mg/dL — ABNORMAL LOW (ref >39)
LDL Chol Calc (NIH): 117 mg/dL — ABNORMAL HIGH (ref 0–99)
Triglycerides: 140 mg/dL (ref 0–149)
VLDL Cholesterol Cal: 25 mg/dL (ref 5–40)

## 2022-06-03 NOTE — Patient Instructions (Addendum)
Continue taking amlodipine 10 mg and carvedilol 12.'5mg'$  twice a day Please call me at [phone] if blood pressure continues to drop low at dialysis  Contine tomando amlodipino 10 mg y carvedilol 12,5 mg [employer]. Llmeme al [phone] si la presin arterial contina bajando durante la dilisis.

## 2022-06-03 NOTE — Progress Notes (Signed)
Patient ID: Blake Santana                 DOB: [birth year]                      MRN: [id number]     HPI: Blake Santana is a 51 y.o. male referred by Dr. Radford Pax to HTN clinic. PMH is significant for HTN, CAD, NSTEMI in 2020 (cath showed 10% mid LAD and 10% ostial D2 stenosis), ESRD on HD TTS, T2DM with diabetic nephropathy and retinopathy, HLD, chronic diastolic CHF, AF. Pt had recent admission back in April for trauma due to fall off ladder from 20 foot height. He was referred to Dr. Radford Pax to follow up for hypertension management after discharge. At visit with Dr. Radford Pax on 02/20/22, patient shared he rarely has sharp stabbing chest pain but no exertional symptoms. He also reported SOB when he is volume overloaded and LE edema before dialysis. BP at this visit was 160/98 and HR 76, Lopressor was switched to carvedilol for better BP lowering and amlodipine 5 mg daily was continued. She also recommended a 48 hour BP monitor and compression stockings for LE edema. Patient was also increased from 20 mg to rosuvastatin 40 mg daily given LDL of 138.  At last visit, blood pressure was 190/92. Amlodipine was increased to 28m daily and carvedilol was increased to 12.558mBID. He had to reschedule his original follow up.   He presents today for follow up with his wife and interpretor. He states he has been taking amlodipine and carvedilol as prescribed. His pain in his leg is when he walks. He denies any dizziness/lightheadedness or headaches at home. Reports yesterday at HD he got dizzy and his blood pressure was a little low. He states this is the only time this has happened. According to nephrology notes he has had elevated K in the past, also issues with Phos and PTH. He is fasting today. Has not seen dentist. Says PCP office was supposed to call him with referral. Has not checked his BP at home in a while.  Home cuff: OMRON upper arm- has to use right arm due to fistula Home cuff: 194/99,  clinic cuff 190/92  Current HTN meds: amlodipine 10 mg, carvedilol 12.48m54mID (has only been taking once daily) Current HLD meds: rosuvastatin 25m39mily and ezetimibe 10mg36mly Previously tried: lopressor 12.5 mg BID  BP goal: <130/80  Family History: father- DM, kidney disease , mother- HTN, DM, kidney disease   Social History: former smoker, sometimes drinks beer 3-4 beers on weekends  Diet:  Starting reducing salt to food Breakfast- egg, with ham or bacon and one cup of coffee Lunch - homemade soup, beans, greens Dinner- quesadillas with cheese or chicken  Sprite, water (8 oz)   Home readings:none  Wt Readings from Last 3 Encounters:  02/20/22 134 lb (60.8 kg)  11/27/21 148 lb 13 oz (67.5 kg)  06/02/21 141 lb 1.5 oz (64 kg)   BP Readings from Last 3 Encounters:  04/13/22 (!) 190/92  03/13/22 (!) 170/98  02/20/22 (!) 160/98   Pulse Readings from Last 3 Encounters:  04/13/22 75  03/13/22 72  02/20/22 76    Renal function: CrCl cannot be calculated (Patient's most recent lab result is older than the maximum 21 days allowed.).  Past Medical History:  Diagnosis Date   Acute respiratory failure with hypoxia (HCC) [place]05/2020   Anemia of chronic kidney failure 03/11/2017   Cataracts,  bilateral 2019   Chest pain 09/01/2018   minimal luminal irregularities   Chronic diastolic CHF (congestive heart failure) ([place])    Chronic kidney disease 03/11/2017   Diabetes mellitus without complication ([place]) 4970   Edema 01/14/2017   Headache    Hypercholesterolemia    Hypertension 03/11/2017   Hypertensive retinopathy of both eyes 2019   Iron deficiency anemia 06/18/2016   Pneumonia 09/07/2018   Proliferative diabetic retinopathy with macular edema ([place]) 2019   Retinal hemorrhage, bilateral and left vitreous hemorrhage.  Being treated with Lucentis.  Dr. Iona Hansen   RSV (respiratory syncytial virus infection) 08/2017    Current Outpatient Medications on File Prior to Visit   Medication Sig Dispense Refill   acetaminophen (TYLENOL) 500 MG tablet Take 1,000 mg by mouth every 6 (six) hours as needed for moderate pain or headache.     AgaMatrix Ultra-Thin Lancets MISC Check blood glucose twice daily before meals. 100 each 11   amLODipine (NORVASC) 10 MG tablet Take 1 tablet (10 mg total) by mouth daily. 90 tablet 3   aspirin-acetaminophen-caffeine (EXCEDRIN MIGRAINE) 250-250-65 MG tablet Take 1-2 tablets by mouth every 6 (six) hours as needed for headache.     b complex-vitamin c-folic acid (NEPHRO-VITE) 0.8 MG TABS tablet Take 1 tablet by mouth Every Tuesday,Thursday,and Saturday with dialysis.      Blood Glucose Monitoring Suppl (AGAMATRIX PRESTO) w/Device KIT Check sugars twice daily 1 kit 0   Blood Glucose Monitoring Suppl (TRUE METRIX METER) w/Device KIT Use as directed 1 kit 0   carvedilol (COREG) 12.5 MG tablet Take 1 tablet (12.5 mg total) by mouth 2 (two) times daily. 180 tablet 3   cinacalcet (SENSIPAR) 60 MG tablet Take 60 mg by mouth daily.     ezetimibe (ZETIA) 10 MG tablet Take 1 tablet (10 mg total) by mouth daily. 90 tablet 3   glipiZIDE (GLUCOTROL) 10 MG tablet Take 10 mg by mouth daily before breakfast.     glucose blood (TRUE METRIX BLOOD GLUCOSE TEST) test strip Use as instructed 100 each 12   lanthanum (FOSRENOL) 1000 MG chewable tablet Chew 2,000 mg by mouth 3 (three) times daily with meals. (Patient not taking: Reported on 04/13/2022)     lidocaine-prilocaine (EMLA) cream Apply 1 application topically 3 (three) times a week. Use at dialysis     loperamide (IMODIUM A-D) 2 MG tablet Take 2 mg by mouth 4 (four) times daily as needed for diarrhea or loose stools.     Methoxy PEG-Epoetin Beta (MIRCERA IJ) Mircera     Oxycodone HCl 10 MG TABS Take 10 mg by mouth every 4 (four) hours as needed. (Patient not taking: Reported on 04/13/2022)     rosuvastatin (CRESTOR) 40 MG tablet Take 1 tablet (40 mg total) by mouth daily. 90 tablet 3   TRUEplus Lancets 28G  MISC Use as directed 100 each 4   No current facility-administered medications on file prior to visit.    No Known Allergies  There were no vitals taken for this visit.   Assessment/Plan:  1. Hypertension - Blood pressure is well controlled today at goal of <130/80. Reviewing HD notes, BP has been 141/ 77, 170/84 and 137/ 76. I think compliance might be a factor is the up and down blood pressures (along with HD). He states he is out of glipizide. Advised that he needs to call PCP for refill. I will also send message. No changes today as blood pressure is well below goal and I do worry  about his dropping too low at HD. He was advised to call me if BP continues to drop too much at HD. I have asked him to check his blood pressure at home a few times a week.   2. Hyperlipidemia- Rosuvastatin was increased to 75m and ezetimibe was added in Sept. Patient is fasting today. Will get repeat lipid panel and LFT today.   MRamond Dial Pharm.D, BCPS, CPP [facility] [place] [address] Phone: [phone] Fax: [phone] 06/03/2022 6:47 AM

## 2022-06-08 ENCOUNTER — Telehealth: Payer: Self-pay | Admitting: Pharmacist

## 2022-06-08 NOTE — Telephone Encounter (Signed)
New fasting lipid panel came back with an increase in LDL-C from 79 to 117. Called patient to discuss labs and patient has been complaint with his rosuvastatin '40mg'$  and zetia '10mg'$ . Patient agreed to come in for appointment Jul 22 2022 at 0930 to discuss lipid panel and treatment options.  Sandford Craze, PharmD. [facility] PGY-1  06/08/2022 4:59 PM

## 2022-06-17 ENCOUNTER — Other Ambulatory Visit: Payer: Self-pay | Admitting: Vascular Surgery

## 2022-06-17 DIAGNOSIS — I739 Peripheral vascular disease, unspecified: Secondary | ICD-10-CM

## 2022-07-22 ENCOUNTER — Ambulatory Visit: Payer: Self-pay | Attending: Pharmacist | Admitting: Pharmacist

## 2022-07-22 NOTE — Progress Notes (Deleted)
Patient ID: Blake Santana                 DOB: [birth year]                    MRN: [id number]     HPI: Blake Santana is a 51 y.o. male patient referred to lipid clinic by Dr Radford Pax. PMH is significant for HTN, CAD, NSTEMI 2020, ESRD on HD TTS, DM2 with diabetic nephropathy and retinopathy, HLD, chronic diastolic CHF, and afib. Previously seen by Lenna Sciara, PharmD for HTN management. BP well controlled at last visit, but updated lipids were checked after pt started ezetimibe and his LDL increased.   Pt taking rosuva and ezetimibe? LDL went up from 79 to 117.. Changes in diet or exercise? No insurance - SNF for [facility]  Current Medications: rosuvastatin 88m daily, ezetimibe 150mdaily Intolerances: none Risk Factors: CAD, DM, CKD, CHF, HTN LDL goal: <5511mL   Diet:  Starting reducing salt to food Breakfast- egg, with ham or bacon and one cup of coffee Lunch - homemade soup, beans, greens Dinner- quesadillas with cheese or chicken  Sprite, water (8 oz)   Exercise:   Family History: Father- DM, kidney disease , mother- HTN, DM, kidney disease   Social History: Former smoker, sometimes drinks beer 3-4 beers on weekends   Labs: 06/03/22: TC 176, TG 140, HDL 34, LDL 117 (rosuvastatin 23m82mily, ezetimibe 10mg58mly) 04/13/22: TC 145, TG 149, HDL 40, LDL 79 (rosuvastatin 23mg 22my) 02/20/22: TC 197, TG 100, HDL 41, LDL 138 (rosuvastatin 20mg d61m)  Past Medical History:  Diagnosis Date   Acute respiratory failure with hypoxia (HCC) [place]/2020   Anemia of chronic kidney failure 03/11/2017   Cataracts, bilateral 2019   Chest pain 09/01/2018   minimal luminal irregularities   Chronic diastolic CHF (congestive heart failure) (HCC)   [place] 03/[place]   Diabetes mellitus without complication (HCC) [place]  3532a 01/14/2017   Headache    Hypercholesterolemia    Hypertension 03/11/2017   Hypertensive retinopathy of both eyes 2019   Iron deficiency  anemia 06/18/2016   Pneumonia 09/07/2018   Proliferative diabetic retinopathy with macular edema (HCC) [place]  Retinal hemorrhage, bilateral and left vitreous hemorrhage.  Being treated with Lucentis.  Dr. Haines Iona Hansen(respiratory syncytial virus infection) 08/2017    Current Outpatient Medications on File Prior to Visit  Medication Sig Dispense Refill   acetaminophen (TYLENOL) 500 MG tablet Take 1,000 mg by mouth every 6 (six) hours as needed for moderate pain or headache.     AgaMatrix Ultra-Thin Lancets MISC Check blood glucose twice daily before meals. 100 each 11   amLODipine (NORVASC) 10 MG tablet Take 1 tablet (10 mg total) by mouth daily. 90 tablet 3   aspirin-acetaminophen-caffeine (EXCEDRIN MIGRAINE) 250-250-65 MG tablet Take 1-2 tablets by mouth every 6 (six) hours as needed for headache. (Patient not taking: Reported on 06/03/2022)     b complex-vitamin c-folic acid (NEPHRO-VITE) 0.8 MG TABS tablet Take 1 tablet by mouth Every Tuesday,Thursday,and Saturday with dialysis.      Blood Glucose Monitoring Suppl (AGAMATRIX PRESTO) w/Device KIT Check sugars twice daily 1 kit 0   Blood Glucose Monitoring Suppl (TRUE METRIX METER) w/Device KIT Use as directed 1 kit 0   carvedilol (COREG) 12.5 MG tablet Take 1 tablet (12.5 mg total) by mouth 2 (two) times daily. 180 tablet 3   cinacalcet (SENSIPAR) 60 MG tablet Take 60 mg by  mouth daily.     ezetimibe (ZETIA) 10 MG tablet Take 1 tablet (10 mg total) by mouth daily. 90 tablet 3   glipiZIDE (GLUCOTROL) 10 MG tablet Take 10 mg by mouth daily before breakfast.     glucose blood (TRUE METRIX BLOOD GLUCOSE TEST) test strip Use as instructed 100 each 12   lanthanum (FOSRENOL) 1000 MG chewable tablet Chew 2,000 mg by mouth 3 (three) times daily with meals.     lidocaine-prilocaine (EMLA) cream Apply 1 application topically 3 (three) times a week. Use at dialysis (Patient not taking: Reported on 06/03/2022)     loperamide (IMODIUM A-D) 2 MG tablet Take  2 mg by mouth 4 (four) times daily as needed for diarrhea or loose stools. (Patient not taking: Reported on 06/03/2022)     Methoxy PEG-Epoetin Beta (MIRCERA IJ) Mircera     Oxycodone HCl 10 MG TABS Take 10 mg by mouth every 4 (four) hours as needed. (Patient not taking: Reported on 04/13/2022)     rosuvastatin (CRESTOR) 40 MG tablet Take 1 tablet (40 mg total) by mouth daily. 90 tablet 3   TRUEplus Lancets 28G MISC Use as directed 100 each 4   No current facility-administered medications on file prior to visit.    No Known Allergies  Assessment/Plan:  1. Hyperlipidemia -

## 2022-09-01 ENCOUNTER — Other Ambulatory Visit: Payer: Self-pay

## 2022-09-01 ENCOUNTER — Emergency Department (HOSPITAL_COMMUNITY)
Admission: EM | Admit: 2022-09-01 | Discharge: 2022-09-02 | Disposition: A | Discharge status: Home / Self Care | Payer: Self-pay | Attending: Emergency Medicine | Admitting: Emergency Medicine

## 2022-09-01 ENCOUNTER — Emergency Department (HOSPITAL_COMMUNITY): Payer: Self-pay

## 2022-09-01 DIAGNOSIS — Z7982 Long term (current) use of aspirin: Secondary | ICD-10-CM | POA: Insufficient documentation

## 2022-09-01 DIAGNOSIS — K4091 Unilateral inguinal hernia, without obstruction or gangrene, recurrent: Secondary | ICD-10-CM | POA: Insufficient documentation

## 2022-09-01 DIAGNOSIS — K4090 Unilateral inguinal hernia, without obstruction or gangrene, not specified as recurrent: Secondary | ICD-10-CM

## 2022-09-01 MED ORDER — OXYCODONE-ACETAMINOPHEN 5-325 MG PO TABS
1.0000 | ORAL_TABLET | Freq: Once | ORAL | Status: AC
  Administered 2022-09-01: 1 via ORAL
  Filled 2022-09-01: qty 1

## 2022-09-01 NOTE — ED Triage Notes (Signed)
Patient reports chronic left hip pain for several months , denies recent injury or fall /ambulatory , patient sneezed and felt increasing pain at left hip this evening.

## 2022-09-02 MED ORDER — OXYCODONE-ACETAMINOPHEN 5-325 MG PO TABS: 1.0000 | ORAL_TABLET | ORAL | 0 refills | Status: DC | PRN

## 2022-09-02 NOTE — Discharge Instructions (Signed)
Call the surgery office for follow-up. Take the prescribed medication as directed.  Do not drive while taking this. Return to the ED for new or worsening symptoms.

## 2022-09-02 NOTE — ED Provider Notes (Signed)
[facility] Provider Note   CSN: [id number] Arrival date & time: 09/01/22  2305     History  Chief Complaint  Patient presents with   Hip Pain    Blake Santana is a 52 y.o. male.  The history is provided by the patient and medical records.  Hip Pain   52 year old male presenting to the ED with left lower abdominal/groin pain.  States he had issues over the past few months, particularly if trying to pick up something heavy or sneezing.  States tonight he was lying in bed and sneezed causing his left leg to drawl up towards his chest and felt a lot of pain in his left lower abdomen/groin.  He denies nausea, vomiting, or diarrhea.  No fever/chills.  Hx of fall from tree last year and sustained left pelvic fracture but denies real issues with this since.  He is ambulatory without difficulty.  No urinary issues.  He was given percocet in triage which seemed to help his pain a lot.  Home Medications Prior to Admission medications   Medication Sig Start Date End Date Taking? Authorizing Provider  acetaminophen (TYLENOL) 500 MG tablet Take 1,000 mg by mouth every 6 (six) hours as needed for moderate pain or headache.    [provider]  AgaMatrix Ultra-Thin Lancets MISC Check blood glucose twice daily before meals. 06/09/21   Fenton Foy, NP  amLODipine (NORVASC) 10 MG tablet Take 1 tablet (10 mg total) by mouth daily. 04/13/22   Sueanne Margarita, MD  aspirin-acetaminophen-caffeine (EXCEDRIN MIGRAINE) [phone] MG tablet Take 1-2 tablets by mouth every 6 (six) hours as needed for headache. Patient not taking: Reported on 06/03/2022    [provider]  b complex-vitamin c-folic acid (NEPHRO-VITE) 0.8 MG TABS tablet Take 1 tablet by mouth Every Tuesday,Thursday,and Saturday with dialysis.  08/11/18   [provider]  Blood Glucose Monitoring Suppl (AGAMATRIX PRESTO) w/Device KIT Check sugars twice daily 06/04/16    Mack Hook, MD  Blood Glucose Monitoring Suppl (TRUE METRIX METER) w/Device KIT Use as directed 12/10/21   [name], NP  carvedilol (COREG) 12.5 MG tablet Take 1 tablet (12.5 mg total) by mouth 2 (two) times daily. 04/13/22   Sueanne Margarita, MD  cinacalcet (SENSIPAR) 60 MG tablet Take 60 mg by mouth daily.    [provider]  ezetimibe (ZETIA) 10 MG tablet Take 1 tablet (10 mg total) by mouth daily. 04/14/22   Sueanne Margarita, MD  glipiZIDE (GLUCOTROL) 10 MG tablet Take 10 mg by mouth daily before breakfast.    [provider]  glucose blood (TRUE METRIX BLOOD GLUCOSE TEST) test strip Use as instructed 12/10/21   [name], NP  lanthanum (FOSRENOL) 1000 MG chewable tablet Chew 2,000 mg by mouth 3 (three) times daily with meals.    [provider]  lidocaine-prilocaine (EMLA) cream Apply 1 application topically 3 (three) times a week. Use at dialysis Patient not taking: Reported on 06/03/2022 02/17/21   [provider]  loperamide (IMODIUM A-D) 2 MG tablet Take 2 mg by mouth 4 (four) times daily as needed for diarrhea or loose stools. Patient not taking: Reported on 06/03/2022    [provider]  Methoxy PEG-Epoetin Beta (MIRCERA IJ) Mircera 12/04/21 12/03/22  [provider]  Oxycodone HCl 10 MG TABS Take 10 mg by mouth every 4 (four) hours as needed. Patient not taking: Reported on 04/13/2022 12/08/21   [provider]  rosuvastatin (CRESTOR) 40 MG tablet Take 1 tablet (40 mg total) by mouth daily. 02/25/22   Sueanne Margarita, MD  TRUEplus Lancets 28G MISC Use as directed 12/10/21   [name], NP      Allergies    Patient has no known allergies.    Review of Systems   Review of Systems  Genitourinary:        Groin pain  All other systems reviewed and are negative.   Physical Exam Updated Vital Signs BP (!) 201/86   Pulse 71   Temp 98.5 F (36.9 C) (Oral)   Resp 18   SpO2 97%  Physical Exam Vitals and  nursing note reviewed.  Constitutional:      Appearance: He is well-developed.  HENT:     Head: Normocephalic and atraumatic.  Eyes:     Conjunctiva/sclera: Conjunctivae normal.     Pupils: Pupils are equal, round, and reactive to light.  Cardiovascular:     Rate and Rhythm: Normal rate and regular rhythm.     Heart sounds: Normal heart sounds.  Pulmonary:     Effort: Pulmonary effort is normal.     Breath sounds: Normal breath sounds.  Abdominal:     General: Bowel sounds are normal.     Palpations: Abdomen is soft.  Genitourinary:      Comments: Left inguinal hernia, reducible, no overlying skin changes/erythema Musculoskeletal:        General: Normal range of motion.     Cervical back: Normal range of motion.     Comments: Hip is non-tender, no leg shortening  Skin:    General: Skin is warm and dry.  Neurological:     Mental Status: He is alert and oriented to person, place, and time.     ED Results / Procedures / Treatments   Labs (all labs ordered are listed, but only abnormal results are displayed) Labs Reviewed - No data to display  EKG None  Radiology DG Hip Unilat W or Wo Pelvis 2-3 Views Left  Result Date: 09/02/2022 CLINICAL DATA:  Chronic left hip pain for months. EXAM: DG HIP (WITH OR WITHOUT PELVIS) 2-3V LEFT COMPARISON:  11/17/2021 FINDINGS: There is no evidence of hip fracture or dislocation. Mild degenerative changes are present at the hips bilaterally. Extensive vascular calcifications are present in the soft tissues bilaterally. IMPRESSION: 1. No acute fracture or dislocation. 2. Mild degenerative changes of the hips bilaterally. Electronically Signed   By: Brett Fairy M.D.   On: 09/02/2022 00:05    Procedures Procedures    Medications Ordered in ED Medications  oxyCODONE-acetaminophen (PERCOCET/ROXICET) 5-325 MG per tablet 1 tablet (1 tablet Oral Given 09/01/22 2342)    ED Course/ Medical Decision Making/ A&P                              Medical Decision Making Amount and/or Complexity of Data Reviewed Radiology: ordered and independent interpretation performed.  Risk Prescription drug management.   52 y.o. M here with left groin pain for several months (triaged as hip pain but upon exam appears to be groin).  He does not have any left leg shortening or other bony findings on exam.  He does seem to have left inguinal hernia.  This is reducible and does not have any overlying skin changes to suggest incarceration/strangulation, no obstructive symptoms.  Clinically this correlates as he does note increase pain with heavy lifting, sneezing,  etc.  Will refer to general surgery to have this evaluated for possible operative repair.  He is already wearing hernia belt.  Rx percocet.  Return here for new concerns.  Final Clinical Impression(s) / ED Diagnoses Final diagnoses:  Left inguinal hernia    Rx / DC Orders ED Discharge Orders          Ordered    oxyCODONE-acetaminophen (PERCOCET) 5-325 MG tablet  Every 4 hours PRN        09/02/22 0118              Larene Pickett, PA-C 09/02/22 0138    Orpah Greek, MD 09/03/22 [phone]

## 2022-09-30 ENCOUNTER — Ambulatory Visit: Payer: Self-pay | Admitting: Surgery

## 2022-10-22 ENCOUNTER — Other Ambulatory Visit: Payer: Self-pay

## 2022-10-22 DIAGNOSIS — M79606 Pain in leg, unspecified: Secondary | ICD-10-CM

## 2022-10-23 ENCOUNTER — Encounter (HOSPITAL_COMMUNITY): Payer: Self-pay | Admitting: Surgery

## 2022-10-23 ENCOUNTER — Other Ambulatory Visit: Payer: Self-pay

## 2022-10-23 NOTE — Progress Notes (Signed)
Spoke with pt for pre-op call via [employer] ([place]) [place], [place] [id number]. Pt denies cardiac history. Pt is diabetic, last A1C was 7.6 on 05/12/22. Pt states he does not check his blood sugar at home. Instructed pt to hold his Glipizide the day of surgery. Pt voiced understanding. Pt is on dialysis T/Th/Sa.   Shower instructions given to pt.

## 2022-10-25 ENCOUNTER — Encounter (HOSPITAL_COMMUNITY): Payer: Self-pay | Admitting: Surgery

## 2022-10-25 NOTE — H&P (Signed)
REFERRING PHYSICIAN: Madelon Lips, MD  PROVIDER: Tyrah Broers Charlotta Newton, MD   Chief Complaint: New Consultation (Secondary hyperparathyroidism, ESRD)  History of Present Illness:  Patient is referred by Dr. Madelon Lips for surgical evaluation and management of secondary hyperparathyroidism of renal origin. Patient has end-stage renal disease and is on hemodialysis. He dialyzes at the [facility]. His access is in his left upper arm. Patient has complained of bone and joint pain. Recent laboratory studies show a elevated intact PTH level of 2830. Total calcium is 10.0. Phosphorus is 7.0. Calcium times phosphorus product is elevated at 70. Patient has had no prior head or neck surgery. Patient is interviewed today accompanied by his wife and with the use of a translator by telephone. Patient also had a recent emergency department visit complaining of left lower extremity pain. He was diagnosed with a left inguinal hernia.  Review of Systems: A complete review of systems was obtained from the patient. I have reviewed this information and discussed as appropriate with the patient. See HPI as well for other ROS.  Review of Systems  Constitutional: Positive for fever.  HENT: Negative.  Eyes: Negative.  Respiratory: Positive for cough.  Cardiovascular: Negative.  Gastrointestinal: Negative.  Genitourinary: Negative.  Musculoskeletal: Negative.  Skin: Negative.  Neurological: Negative.  Endo/Heme/Allergies: Negative.  Psychiatric/Behavioral: Negative.    Medical History: Past Medical History:  Diagnosis Date  Anemia  Chronic kidney disease  Diabetes mellitus without complication (CMS-HCC)  GERD (gastroesophageal reflux disease)  Glaucoma (increased eye pressure)  Hyperlipidemia  Hypertension  Thyroid disease   Patient Active Problem List  Diagnosis  Secondary hyperparathyroidism of renal origin (CMS-HCC)   History reviewed. No pertinent surgical  history.   No Known Allergies  Current Outpatient Medications on File Prior to Visit  Medication Sig Dispense Refill  carvediloL (COREG) 12.5 MG tablet Take 12.5 mg by mouth 2 (two) times daily  ezetimibe (ZETIA) 10 mg tablet Take 10 mg by mouth once daily  lanthanum (FOSRENOL) 1000 MG chewable tablet Take by mouth  loperamide (IMODIUM A-D) 2 mg tablet Take by mouth  rosuvastatin (CRESTOR) 40 MG tablet Take 40 mg by mouth once daily   No current facility-administered medications on file prior to visit.   History reviewed. No pertinent family history.   Social History   Tobacco Use  Smoking Status Never  Smokeless Tobacco Never    Social History   Socioeconomic History  Marital status: Married  Tobacco Use  Smoking status: Never  Smokeless tobacco: Never  Substance and Sexual Activity  Alcohol use: Never  Drug use: Never   Objective:   Vitals:  BP: 134/80  Pulse: 86  Temp: (!) 38.1 C (100.5 F)  SpO2: 91%  Weight: 63.5 kg (140 lb)  Height: 152.4 cm (5')   Body mass index is 27.34 kg/m.  Physical Exam   GENERAL APPEARANCE Comfortable, no acute issues Development: normal Gross deformities: none  SKIN Rash, lesions, ulcers: none Induration, erythema: none Nodules: none palpable  EYES Conjunctiva and lids: normal Pupils: equal and reactive  EARS, NOSE, MOUTH, THROAT External ears: no lesion or deformity External nose: no lesion or deformity Hearing: grossly normal  NECK Symmetric: yes Trachea: midline Thyroid: no palpable nodules in the thyroid bed  CHEST Respiratory effort: normal Retraction or accessory muscle use: no Breath sounds: normal bilaterally Rales, rhonchi, wheeze: none  CARDIOVASCULAR Auscultation: regular rhythm, normal rate Murmurs: none Pulses: radial pulse 2+ palpable Lower extremity edema: none  ABDOMEN Not  assessed  GENITOURINARY/RECTAL Normal male genitalia. Palpation in the right inguinal canal with cough and  Valsalva shows no sign of hernia. Palpation in the left inguinal canal with cough and Valsalva shows no sign of hernia. There are no visible bulges. There is no tenderness.  MUSCULOSKELETAL Station and gait: normal Digits and nails: no clubbing or cyanosis Muscle strength: grossly normal all extremities Range of motion: grossly normal all extremities Deformity: none  LYMPHATIC Cervical: none palpable Supraclavicular: none palpable  PSYCHIATRIC Oriented to person, place, and time: yes Mood and affect: normal for situation Judgment and insight: appropriate for situation    Assessment and Plan:   Secondary hyperparathyroidism of renal origin (CMS-HCC)  Patient is referred by his nephrologist for surgical evaluation and management of secondary hyperparathyroidism of renal origin. Today, with the assistance of a translator, we discussed proceeding with total parathyroidectomy with autotransplantation to the right forearm. We discussed the size and location of the surgical incisions. We discussed the risk and benefits of the surgery. We discussed the hospital stay to be anticipated. The patient and his wife were allowed to ask questions. They expressed understanding and agreed to proceed.  Patient was examined today during this visit for evidence of inguinal hernia. Examination was normal with no evidence of inguinal hernia on today's physical examination.  We will plan to enter orders and coordinate surgery with the nephrologist at [facility] at a time convenient for the patient in the near future.   [name], MD [facility] [place] practice Office: [phone]

## 2022-10-26 ENCOUNTER — Encounter (HOSPITAL_COMMUNITY)
Admission: RE | Disposition: A | Discharge status: Home / Self Care | Payer: Self-pay | Source: Home / Self Care | Attending: Surgery

## 2022-10-26 ENCOUNTER — Inpatient Hospital Stay (HOSPITAL_COMMUNITY)
Admission: RE | Admit: 2022-10-26 | Discharge: 2022-10-31 | DRG: 674 | Disposition: A | Discharge status: Home / Self Care | Payer: Medicaid Other | Attending: Surgery | Admitting: Surgery

## 2022-10-26 ENCOUNTER — Inpatient Hospital Stay (HOSPITAL_COMMUNITY): Payer: Medicaid Other | Admitting: Certified Registered"

## 2022-10-26 ENCOUNTER — Other Ambulatory Visit: Payer: Self-pay

## 2022-10-26 ENCOUNTER — Encounter (HOSPITAL_COMMUNITY): Payer: Self-pay | Admitting: Surgery

## 2022-10-26 DIAGNOSIS — I2510 Atherosclerotic heart disease of native coronary artery without angina pectoris: Secondary | ICD-10-CM | POA: Diagnosis not present

## 2022-10-26 DIAGNOSIS — E113593 Type 2 diabetes mellitus with proliferative diabetic retinopathy without macular edema, bilateral: Secondary | ICD-10-CM | POA: Diagnosis present

## 2022-10-26 DIAGNOSIS — Z8616 Personal history of COVID-19: Secondary | ICD-10-CM | POA: Diagnosis not present

## 2022-10-26 DIAGNOSIS — E1122 Type 2 diabetes mellitus with diabetic chronic kidney disease: Secondary | ICD-10-CM | POA: Diagnosis present

## 2022-10-26 DIAGNOSIS — R519 Headache, unspecified: Secondary | ICD-10-CM | POA: Diagnosis not present

## 2022-10-26 DIAGNOSIS — I152 Hypertension secondary to endocrine disorders: Secondary | ICD-10-CM | POA: Diagnosis present

## 2022-10-26 DIAGNOSIS — N2581 Secondary hyperparathyroidism of renal origin: Secondary | ICD-10-CM | POA: Diagnosis present

## 2022-10-26 DIAGNOSIS — Z87891 Personal history of nicotine dependence: Secondary | ICD-10-CM

## 2022-10-26 DIAGNOSIS — M2550 Pain in unspecified joint: Secondary | ICD-10-CM | POA: Diagnosis present

## 2022-10-26 DIAGNOSIS — I120 Hypertensive chronic kidney disease with stage 5 chronic kidney disease or end stage renal disease: Secondary | ICD-10-CM | POA: Diagnosis not present

## 2022-10-26 DIAGNOSIS — I252 Old myocardial infarction: Secondary | ICD-10-CM

## 2022-10-26 DIAGNOSIS — N250 Renal osteodystrophy: Secondary | ICD-10-CM | POA: Diagnosis present

## 2022-10-26 DIAGNOSIS — Z79899 Other long term (current) drug therapy: Secondary | ICD-10-CM

## 2022-10-26 DIAGNOSIS — Z992 Dependence on renal dialysis: Secondary | ICD-10-CM

## 2022-10-26 DIAGNOSIS — E7800 Pure hypercholesterolemia, unspecified: Secondary | ICD-10-CM | POA: Diagnosis present

## 2022-10-26 DIAGNOSIS — E1169 Type 2 diabetes mellitus with other specified complication: Secondary | ICD-10-CM | POA: Diagnosis present

## 2022-10-26 DIAGNOSIS — Z7984 Long term (current) use of oral hypoglycemic drugs: Secondary | ICD-10-CM

## 2022-10-26 DIAGNOSIS — I5032 Chronic diastolic (congestive) heart failure: Secondary | ICD-10-CM | POA: Diagnosis present

## 2022-10-26 DIAGNOSIS — R059 Cough, unspecified: Secondary | ICD-10-CM | POA: Diagnosis present

## 2022-10-26 DIAGNOSIS — Z841 Family history of disorders of kidney and ureter: Secondary | ICD-10-CM

## 2022-10-26 DIAGNOSIS — E1159 Type 2 diabetes mellitus with other circulatory complications: Secondary | ICD-10-CM | POA: Diagnosis present

## 2022-10-26 DIAGNOSIS — E113519 Type 2 diabetes mellitus with proliferative diabetic retinopathy with macular edema, unspecified eye: Secondary | ICD-10-CM | POA: Diagnosis present

## 2022-10-26 DIAGNOSIS — D631 Anemia in chronic kidney disease: Secondary | ICD-10-CM | POA: Diagnosis present

## 2022-10-26 DIAGNOSIS — I132 Hypertensive heart and chronic kidney disease with heart failure and with stage 5 chronic kidney disease, or end stage renal disease: Secondary | ICD-10-CM | POA: Diagnosis present

## 2022-10-26 DIAGNOSIS — Z8719 Personal history of other diseases of the digestive system: Secondary | ICD-10-CM

## 2022-10-26 DIAGNOSIS — E11319 Type 2 diabetes mellitus with unspecified diabetic retinopathy without macular edema: Secondary | ICD-10-CM | POA: Diagnosis present

## 2022-10-26 DIAGNOSIS — Z811 Family history of alcohol abuse and dependence: Secondary | ICD-10-CM

## 2022-10-26 DIAGNOSIS — H409 Unspecified glaucoma: Secondary | ICD-10-CM | POA: Diagnosis present

## 2022-10-26 DIAGNOSIS — N186 End stage renal disease: Secondary | ICD-10-CM

## 2022-10-26 DIAGNOSIS — R079 Chest pain, unspecified: Secondary | ICD-10-CM | POA: Diagnosis not present

## 2022-10-26 DIAGNOSIS — Z7983 Long term (current) use of bisphosphonates: Secondary | ICD-10-CM | POA: Diagnosis not present

## 2022-10-26 DIAGNOSIS — Z8249 Family history of ischemic heart disease and other diseases of the circulatory system: Secondary | ICD-10-CM

## 2022-10-26 DIAGNOSIS — K219 Gastro-esophageal reflux disease without esophagitis: Secondary | ICD-10-CM | POA: Diagnosis present

## 2022-10-26 DIAGNOSIS — Z789 Other specified health status: Secondary | ICD-10-CM | POA: Insufficient documentation

## 2022-10-26 DIAGNOSIS — Z833 Family history of diabetes mellitus: Secondary | ICD-10-CM

## 2022-10-26 HISTORY — PX: PARATHYROIDECTOMY: SHX19

## 2022-10-26 LAB — GLUCOSE, CAPILLARY
Glucose-Capillary: 111 mg/dL — ABNORMAL HIGH (ref 70–99)
Glucose-Capillary: 116 mg/dL — ABNORMAL HIGH (ref 70–99)
Glucose-Capillary: 139 mg/dL — ABNORMAL HIGH (ref 70–99)
Glucose-Capillary: 194 mg/dL — ABNORMAL HIGH (ref 70–99)
Glucose-Capillary: 247 mg/dL — ABNORMAL HIGH (ref 70–99)

## 2022-10-26 LAB — POCT I-STAT, CHEM 8
BUN: 51 mg/dL — ABNORMAL HIGH (ref 6–20)
Calcium, Ion: 1.15 mmol/L (ref 1.15–1.40)
Chloride: 99 mmol/L (ref 98–111)
Creatinine, Ser: 10.9 mg/dL — ABNORMAL HIGH (ref 0.61–1.24)
Glucose, Bld: 117 mg/dL — ABNORMAL HIGH (ref 70–99)
HCT: 37 % — ABNORMAL LOW (ref 39.0–52.0)
Hemoglobin: 12.6 g/dL — ABNORMAL LOW (ref 13.0–17.0)
Potassium: 4.7 mmol/L (ref 3.5–5.1)
Sodium: 136 mmol/L (ref 135–145)
TCO2: 27 mmol/L (ref 22–32)

## 2022-10-26 LAB — CALCIUM: Calcium: 9.2 mg/dL (ref 8.9–10.3)

## 2022-10-26 LAB — HEPATITIS B SURFACE ANTIGEN: Hepatitis B Surface Ag: NONREACTIVE

## 2022-10-26 LAB — ALBUMIN: Albumin: 3.6 g/dL (ref 3.5–5.0)

## 2022-10-26 SURGERY — PARATHYROIDECTOMY
Anesthesia: General | Site: Neck | Laterality: Right

## 2022-10-26 MED ORDER — LACTATED RINGERS IV SOLN: INTRAVENOUS | Status: DC

## 2022-10-26 MED ORDER — ONDANSETRON HCL 4 MG/2ML IJ SOLN
INTRAMUSCULAR | Status: DC | PRN
  Administered 2022-10-26: 4 mg via INTRAVENOUS

## 2022-10-26 MED ORDER — INSULIN ASPART 100 UNIT/ML IJ SOLN
0.0000 [IU] | Freq: Three times a day (TID) | INTRAMUSCULAR | Status: DC
  Administered 2022-10-27 – 2022-10-28 (×2): 2 [IU] via SUBCUTANEOUS
  Administered 2022-10-28 – 2022-10-30 (×3): 3 [IU] via SUBCUTANEOUS
  Administered 2022-10-30: 2 [IU] via SUBCUTANEOUS

## 2022-10-26 MED ORDER — MEPERIDINE HCL 25 MG/ML IJ SOLN: 6.2500 mg | INTRAMUSCULAR | Status: DC | PRN

## 2022-10-26 MED ORDER — SODIUM CHLORIDE 0.9 % IV SOLN: INTRAVENOUS | Status: DC

## 2022-10-26 MED ORDER — ACETAMINOPHEN 500 MG PO TABS
1000.0000 mg | ORAL_TABLET | Freq: Once | ORAL | Status: AC
  Administered 2022-10-26: 1000 mg via ORAL
  Filled 2022-10-26: qty 2

## 2022-10-26 MED ORDER — DEXAMETHASONE SODIUM PHOSPHATE 10 MG/ML IJ SOLN
INTRAMUSCULAR | Status: DC | PRN
  Administered 2022-10-26: 5 mg via INTRAVENOUS

## 2022-10-26 MED ORDER — PROMETHAZINE HCL 25 MG/ML IJ SOLN: 6.2500 mg | INTRAMUSCULAR | Status: DC | PRN

## 2022-10-26 MED ORDER — CEFAZOLIN SODIUM-DEXTROSE 2-4 GM/100ML-% IV SOLN
INTRAVENOUS | Status: AC
  Filled 2022-10-26: qty 100

## 2022-10-26 MED ORDER — CARVEDILOL 12.5 MG PO TABS
12.5000 mg | ORAL_TABLET | Freq: Two times a day (BID) | ORAL | Status: DC
  Administered 2022-10-26 – 2022-10-31 (×7): 12.5 mg via ORAL
  Filled 2022-10-26 (×10): qty 1

## 2022-10-26 MED ORDER — BUPIVACAINE HCL (PF) 0.25 % IJ SOLN
INTRAMUSCULAR | Status: AC
  Filled 2022-10-26: qty 30

## 2022-10-26 MED ORDER — LIDOCAINE 2% (20 MG/ML) 5 ML SYRINGE
INTRAMUSCULAR | Status: DC | PRN
  Administered 2022-10-26: 60 mg via INTRAVENOUS

## 2022-10-26 MED ORDER — AMLODIPINE BESYLATE 10 MG PO TABS
10.0000 mg | ORAL_TABLET | Freq: Every day | ORAL | Status: DC
  Administered 2022-10-26: 10 mg via ORAL
  Filled 2022-10-26: qty 1

## 2022-10-26 MED ORDER — TRAMADOL HCL 50 MG PO TABS
50.0000 mg | ORAL_TABLET | Freq: Four times a day (QID) | ORAL | Status: DC | PRN
  Administered 2022-10-27: 50 mg via ORAL
  Filled 2022-10-26: qty 1

## 2022-10-26 MED ORDER — GLIPIZIDE 10 MG PO TABS
10.0000 mg | ORAL_TABLET | Freq: Every day | ORAL | Status: DC
  Administered 2022-10-27 – 2022-10-31 (×5): 10 mg via ORAL
  Filled 2022-10-26 (×6): qty 1

## 2022-10-26 MED ORDER — MIDAZOLAM HCL 2 MG/2ML IJ SOLN
INTRAMUSCULAR | Status: AC
  Filled 2022-10-26: qty 2

## 2022-10-26 MED ORDER — HYDROMORPHONE HCL 1 MG/ML IJ SOLN: 1.0000 mg | INTRAMUSCULAR | Status: DC | PRN

## 2022-10-26 MED ORDER — 0.9 % SODIUM CHLORIDE (POUR BTL) OPTIME
TOPICAL | Status: DC | PRN
  Administered 2022-10-26: 2000 mL

## 2022-10-26 MED ORDER — ROCURONIUM BROMIDE 10 MG/ML (PF) SYRINGE
PREFILLED_SYRINGE | INTRAVENOUS | Status: DC | PRN
  Administered 2022-10-26: 50 mg via INTRAVENOUS
  Administered 2022-10-26: 20 mg via INTRAVENOUS

## 2022-10-26 MED ORDER — MIDAZOLAM HCL 2 MG/2ML IJ SOLN: 0.5000 mg | Freq: Once | INTRAMUSCULAR | Status: DC | PRN

## 2022-10-26 MED ORDER — CHLORHEXIDINE GLUCONATE CLOTH 2 % EX PADS: 6.0000 | MEDICATED_PAD | Freq: Once | CUTANEOUS | Status: DC

## 2022-10-26 MED ORDER — MIDAZOLAM HCL 2 MG/2ML IJ SOLN
INTRAMUSCULAR | Status: DC | PRN
  Administered 2022-10-26: 2 mg via INTRAVENOUS

## 2022-10-26 MED ORDER — CEFAZOLIN SODIUM-DEXTROSE 2-4 GM/100ML-% IV SOLN
2.0000 g | INTRAVENOUS | Status: AC
  Administered 2022-10-26: 2 g via INTRAVENOUS

## 2022-10-26 MED ORDER — FENTANYL CITRATE (PF) 250 MCG/5ML IJ SOLN
INTRAMUSCULAR | Status: AC
  Filled 2022-10-26: qty 5

## 2022-10-26 MED ORDER — HEMOSTATIC AGENTS (NO CHARGE) OPTIME
TOPICAL | Status: DC | PRN
  Administered 2022-10-26: 1 via TOPICAL

## 2022-10-26 MED ORDER — ONDANSETRON HCL 4 MG/2ML IJ SOLN
INTRAMUSCULAR | Status: AC
  Filled 2022-10-26: qty 2

## 2022-10-26 MED ORDER — CARVEDILOL 12.5 MG PO TABS
ORAL_TABLET | ORAL | Status: AC
  Administered 2022-10-26: 12.5 mg via ORAL
  Filled 2022-10-26: qty 1

## 2022-10-26 MED ORDER — HYDROMORPHONE HCL 1 MG/ML IJ SOLN
INTRAMUSCULAR | Status: AC
  Filled 2022-10-26: qty 1

## 2022-10-26 MED ORDER — ONDANSETRON 4 MG PO TBDP: 4.0000 mg | ORAL_TABLET | Freq: Four times a day (QID) | ORAL | Status: DC | PRN

## 2022-10-26 MED ORDER — ONDANSETRON HCL 4 MG/2ML IJ SOLN
4.0000 mg | Freq: Four times a day (QID) | INTRAMUSCULAR | Status: DC | PRN
  Administered 2022-10-26: 4 mg via INTRAVENOUS
  Filled 2022-10-26: qty 2

## 2022-10-26 MED ORDER — AMLODIPINE BESYLATE 10 MG PO TABS
10.0000 mg | ORAL_TABLET | Freq: Every day | ORAL | Status: DC
  Administered 2022-10-26 – 2022-10-30 (×5): 10 mg via ORAL
  Filled 2022-10-26 (×4): qty 1

## 2022-10-26 MED ORDER — FENTANYL CITRATE (PF) 250 MCG/5ML IJ SOLN
INTRAMUSCULAR | Status: DC | PRN
  Administered 2022-10-26: 100 ug via INTRAVENOUS
  Administered 2022-10-26: 50 ug via INTRAVENOUS

## 2022-10-26 MED ORDER — HYDROMORPHONE HCL 1 MG/ML IJ SOLN
0.2500 mg | INTRAMUSCULAR | Status: DC | PRN
  Administered 2022-10-26 (×4): 0.5 mg via INTRAVENOUS

## 2022-10-26 MED ORDER — CALCIUM CARBONATE ANTACID 500 MG PO CHEW
3.0000 | CHEWABLE_TABLET | Freq: Three times a day (TID) | ORAL | Status: DC
  Administered 2022-10-26 – 2022-10-28 (×8): 600 mg via ORAL
  Filled 2022-10-26 (×8): qty 3

## 2022-10-26 MED ORDER — ORAL CARE MOUTH RINSE: 15.0000 mL | Freq: Once | OROMUCOSAL | Status: AC

## 2022-10-26 MED ORDER — PROPOFOL 10 MG/ML IV BOLUS
INTRAVENOUS | Status: AC
  Filled 2022-10-26: qty 20

## 2022-10-26 MED ORDER — BUPIVACAINE HCL (PF) 0.25 % IJ SOLN: INTRAMUSCULAR | Status: DC | PRN

## 2022-10-26 MED ORDER — OXYCODONE HCL 5 MG PO TABS: 5.0000 mg | ORAL_TABLET | Freq: Once | ORAL | Status: DC | PRN

## 2022-10-26 MED ORDER — PHENYLEPHRINE 80 MCG/ML (10ML) SYRINGE FOR IV PUSH (FOR BLOOD PRESSURE SUPPORT)
PREFILLED_SYRINGE | INTRAVENOUS | Status: DC | PRN
  Administered 2022-10-26: 160 ug via INTRAVENOUS
  Administered 2022-10-26 (×2): 80 ug via INTRAVENOUS
  Administered 2022-10-26: 160 ug via INTRAVENOUS

## 2022-10-26 MED ORDER — PROPOFOL 10 MG/ML IV BOLUS
INTRAVENOUS | Status: DC | PRN
  Administered 2022-10-26: 150 mg via INTRAVENOUS

## 2022-10-26 MED ORDER — SUGAMMADEX SODIUM 200 MG/2ML IV SOLN
INTRAVENOUS | Status: DC | PRN
  Administered 2022-10-26: 200 mg via INTRAVENOUS

## 2022-10-26 MED ORDER — INSULIN ASPART 100 UNIT/ML IJ SOLN: 0.0000 [IU] | INTRAMUSCULAR | Status: DC | PRN

## 2022-10-26 MED ORDER — OXYCODONE HCL 5 MG/5ML PO SOLN: 5.0000 mg | Freq: Once | ORAL | Status: DC | PRN

## 2022-10-26 MED ORDER — HYDROMORPHONE HCL 1 MG/ML IJ SOLN
0.2500 mg | INTRAMUSCULAR | Status: DC | PRN
  Administered 2022-10-26 (×2): 0.25 mg via INTRAVENOUS

## 2022-10-26 MED ORDER — LANTHANUM CARBONATE 500 MG PO CHEW
2000.0000 mg | CHEWABLE_TABLET | Freq: Three times a day (TID) | ORAL | Status: DC
  Administered 2022-10-27 – 2022-10-28 (×6): 2000 mg via ORAL
  Filled 2022-10-26 (×9): qty 4

## 2022-10-26 MED ORDER — CHLORHEXIDINE GLUCONATE CLOTH 2 % EX PADS
6.0000 | MEDICATED_PAD | Freq: Every day | CUTANEOUS | Status: DC
  Administered 2022-10-28 – 2022-10-29 (×2): 6 via TOPICAL

## 2022-10-26 MED ORDER — ACETAMINOPHEN 650 MG RE SUPP: 650.0000 mg | Freq: Four times a day (QID) | RECTAL | Status: DC | PRN

## 2022-10-26 MED ORDER — OXYCODONE HCL 5 MG PO TABS
5.0000 mg | ORAL_TABLET | ORAL | Status: DC | PRN
  Administered 2022-10-26 – 2022-10-27 (×2): 10 mg via ORAL
  Administered 2022-10-30: 5 mg via ORAL
  Filled 2022-10-26 (×2): qty 2
  Filled 2022-10-26: qty 1

## 2022-10-26 MED ORDER — CALCITRIOL 0.5 MCG PO CAPS
0.5000 ug | ORAL_CAPSULE | Freq: Two times a day (BID) | ORAL | Status: DC
  Administered 2022-10-26 – 2022-10-27 (×3): 0.5 ug via ORAL
  Filled 2022-10-26 (×3): qty 1

## 2022-10-26 MED ORDER — ACETAMINOPHEN 325 MG PO TABS
650.0000 mg | ORAL_TABLET | Freq: Four times a day (QID) | ORAL | Status: DC | PRN
  Administered 2022-10-28 (×2): 650 mg via ORAL
  Filled 2022-10-26 (×2): qty 2

## 2022-10-26 MED ORDER — CARVEDILOL 12.5 MG PO TABS: 12.5000 mg | ORAL_TABLET | Freq: Once | ORAL | Status: AC

## 2022-10-26 MED ORDER — CHLORHEXIDINE GLUCONATE 0.12 % MT SOLN
15.0000 mL | Freq: Once | OROMUCOSAL | Status: AC
  Administered 2022-10-26: 15 mL via OROMUCOSAL
  Filled 2022-10-26: qty 15

## 2022-10-26 SURGICAL SUPPLY — 69 items
ADH SKN CLS APL DERMABOND .7 (GAUZE/BANDAGES/DRESSINGS) ×4
APL PRP STRL LF DISP 70% ISPRP (MISCELLANEOUS) ×4
APL PRP STRL LF ISPRP CHG 10.5 (MISCELLANEOUS)
APPLICATOR CHLORAPREP 10.5 ORG (MISCELLANEOUS) ×4 IMPLANT
ATTRACTOMAT 16X20 MAGNETIC DRP (DRAPES) ×2 IMPLANT
BAG COUNTER SPONGE SURGICOUNT (BAG) ×2 IMPLANT
BAG SPNG CNTER NS LX DISP (BAG)
BLADE CLIPPER SURG (BLADE) IMPLANT
BLADE SURG 10 STRL SS (BLADE) ×3 IMPLANT
BLADE SURG 15 STRL LF DISP TIS (BLADE) ×6 IMPLANT
BLADE SURG 15 STRL SS (BLADE) ×6
BNDG ELASTIC 4X5.8 VLCR STR LF (GAUZE/BANDAGES/DRESSINGS) ×2 IMPLANT
BNDG GAUZE DERMACEA FLUFF 4 (GAUZE/BANDAGES/DRESSINGS) ×3 IMPLANT
BNDG GZE DERMACEA 4 6PLY (GAUZE/BANDAGES/DRESSINGS)
CANISTER SUCT 3000ML PPV (MISCELLANEOUS) ×2 IMPLANT
CHLORAPREP W/TINT 26 (MISCELLANEOUS) ×3 IMPLANT
CLIP TI MEDIUM 24 (CLIP) ×2 IMPLANT
CLIP TI MEDIUM 6 (CLIP) ×2 IMPLANT
CLIP TI WIDE RED SMALL 24 (CLIP) ×3 IMPLANT
CLIP TI WIDE RED SMALL 6 (CLIP) ×2 IMPLANT
CNTNR URN SCR LID CUP LEK RST (MISCELLANEOUS) ×16 IMPLANT
CONT SPEC 4OZ STRL OR WHT (MISCELLANEOUS) ×16
CONTAINER PROTECT SURGISLUSH (MISCELLANEOUS) ×2 IMPLANT
COVER SURGICAL LIGHT HANDLE (MISCELLANEOUS) ×2 IMPLANT
DERMABOND ADVANCED .7 DNX12 (GAUZE/BANDAGES/DRESSINGS) IMPLANT
DRAPE LAPAROTOMY 100X72 PEDS (DRAPES) ×4 IMPLANT
DRAPE SLUSH/WARMER DISC (DRAPES) IMPLANT
DRAPE UTILITY XL STRL (DRAPES) ×8 IMPLANT
ELECT CAUTERY BLADE 6.4 (BLADE) ×3 IMPLANT
ELECT REM PT RETURN 9FT ADLT (ELECTROSURGICAL) ×2
ELECTRODE REM PT RTRN 9FT ADLT (ELECTROSURGICAL) ×2 IMPLANT
GAUZE 4X4 16PLY ~~LOC~~+RFID DBL (SPONGE) ×3 IMPLANT
GAUZE SPONGE 2X2 8PLY STRL LF (GAUZE/BANDAGES/DRESSINGS) ×3 IMPLANT
GAUZE SPONGE 4X4 12PLY STRL (GAUZE/BANDAGES/DRESSINGS) ×2 IMPLANT
GLOVE SURG ORTHO 8.0 STRL STRW (GLOVE) ×4 IMPLANT
GOWN STRL REUS W/ TWL LRG LVL3 (GOWN DISPOSABLE) ×4 IMPLANT
GOWN STRL REUS W/ TWL XL LVL3 (GOWN DISPOSABLE) ×6 IMPLANT
GOWN STRL REUS W/TWL LRG LVL3 (GOWN DISPOSABLE) ×4
GOWN STRL REUS W/TWL XL LVL3 (GOWN DISPOSABLE) ×4
HEMOSTAT ARISTA ABSORB 3G PWDR (HEMOSTASIS) IMPLANT
HEMOSTAT SURGICEL 2X4 FIBR (HEMOSTASIS) ×3 IMPLANT
ILLUMINATOR WAVEGUIDE N/F (MISCELLANEOUS) ×2 IMPLANT
KIT BASIN OR (CUSTOM PROCEDURE TRAY) ×2 IMPLANT
KIT TURNOVER KIT B (KITS) ×2 IMPLANT
NDL HYPO 25GX1X1/2 BEV (NEEDLE) ×3 IMPLANT
NEEDLE HYPO 25GX1X1/2 BEV (NEEDLE) ×2 IMPLANT
NS IRRIG 1000ML POUR BTL (IV SOLUTION) ×2 IMPLANT
PACK BASIC III (CUSTOM PROCEDURE TRAY) ×2
PACK SRG BSC III STRL LF ECLPS (CUSTOM PROCEDURE TRAY) ×3 IMPLANT
PAD ARMBOARD 7.5X6 YLW CONV (MISCELLANEOUS) ×3 IMPLANT
PENCIL BUTTON HOLSTER BLD 10FT (ELECTRODE) ×3 IMPLANT
POSITIONER HEAD DONUT 9IN (MISCELLANEOUS) ×3 IMPLANT
SHEARS HARMONIC 9CM CVD (BLADE) ×2 IMPLANT
SPONGE INTESTINAL PEANUT (DISPOSABLE) ×2 IMPLANT
STRIP CLOSURE SKIN 1/2X4 (GAUZE/BANDAGES/DRESSINGS) ×6 IMPLANT
SUT MNCRL AB 4-0 PS2 18 (SUTURE) ×4 IMPLANT
SUT PROLENE 4 0 RB 1 (SUTURE) ×2
SUT PROLENE 4-0 RB1 .5 CRCL 36 (SUTURE) ×3 IMPLANT
SUT SILK 2 0 (SUTURE) ×2
SUT SILK 2-0 18XBRD TIE 12 (SUTURE) ×2 IMPLANT
SUT SILK 3 0 (SUTURE)
SUT SILK 3-0 18XBRD TIE 12 (SUTURE) IMPLANT
SUT VIC AB 3-0 SH 18 (SUTURE) ×4 IMPLANT
SYR BULB EAR ULCER 3OZ GRN STR (SYRINGE) ×2 IMPLANT
SYR CONTROL 10ML LL (SYRINGE) ×2 IMPLANT
TOWEL GREEN STERILE (TOWEL DISPOSABLE) ×3 IMPLANT
TOWEL GREEN STERILE FF (TOWEL DISPOSABLE) ×3 IMPLANT
TUBE CONNECTING 12X1/4 (SUCTIONS) ×3 IMPLANT
UNDERPAD 30X36 HEAVY ABSORB (UNDERPADS AND DIAPERS) ×3 IMPLANT

## 2022-10-26 NOTE — Interval H&P Note (Signed)
History and Physical Interval Note:  10/26/2022 11:35 AM  Blake Santana  has presented today for surgery, with the diagnosis of SECONDARY HYPERPARATHYROIDISM OF RENAL ORIGIN.  The various methods of treatment have been discussed with the patient and family. After consideration of risks, benefits and other options for treatment, the patient has consented to    Procedure(s): total PARATHYROIDECTOMY (N/A) AUTOTRANSPLANT TO RIGHT FOREARM (Right) as a surgical intervention.    The patient's history has been reviewed, patient examined, no change in status, stable for surgery.  I have reviewed the patient's chart and labs.  Questions were answered to the patient's satisfaction.    [name], [facility] [place] practice Office: [place]

## 2022-10-26 NOTE — Consult Note (Cosign Needed Addendum)
[facility] Renal Consultation Note    Indication for Consultation:  Management of ESRD/hemodialysis, anemia, hypertension/volume, and secondary hyperparathyroidism.  HPI:  Patient is a 52 year old male in [place] including ESRD on dialysis, DM, HTN, CHF, NSTEMI, and recent dx of left inguinal hernia. Patient admitted s/p parathyroidectomy by Dr. Harlow Asa. 4 glands were removed and 8 fragments transplanted into the right arm. Outpatient records reviewed. Latest PTH was 10/13/22. Serum calcium has been 9-10.5 on recent labs.  He was taking sensipar 180mg  daily at home, no VDRA due to hypercalcemia. Attends dialysis on TTS, reports good compliance with dialysis, has not missed any treatments recently. Labs otherwise notable for Cr 10.9, BUN 51, K+ 4.7, Na 136, Hgb 12.6. BP elevated, afebrile. Patient seen in the PACU with translator. He reports his arm is sore. Reports he is still tired. He denies SOB, CP, dizziness, and nausea. No fevers.   Antihypertensive: amlodipine 10mg  daily. Has both carvedilol 12.5mg  BID and meotprolol 12.5mg  BID listed on outpatient med list, unclear if he is actually taking either   Past Medical History:  Diagnosis Date   Acute respiratory failure with hypoxia ([place]) 09/07/2018   Anemia of chronic kidney failure 03/11/2017   Cataracts, bilateral 2019   Chest pain 09/01/2018   minimal luminal irregularities   Chronic diastolic CHF (congestive heart failure) ([place])    Chronic kidney disease 03/11/2017   Dialysis Tu/Th/Sa   COVID 2022   hospitalized   Diabetes mellitus without complication ([place]) [id number]   Edema 01/14/2017   Headache    Hypercholesterolemia    Hypertension 03/11/2017   Hypertensive retinopathy of both eyes 2019   Iron deficiency anemia 06/18/2016   Pneumonia 09/07/2018   Proliferative diabetic retinopathy with macular edema ([place]) 2019   Retinal hemorrhage, bilateral and left vitreous hemorrhage.  Being treated with Lucentis.  Dr. Iona Hansen   RSV  (respiratory syncytial virus infection) 08/2017   Past Surgical History:  Procedure Laterality Date   A/V FISTULAGRAM Left 01/19/2018   Procedure: A/V FISTULAGRAM - Left AV;  Surgeon: Waynetta Sandy, MD;  Location: [place] [facility];  Service: Cardiovascular;  Laterality: Left;   AV FISTULA PLACEMENT Left 08/09/2017   Procedure: LEFT RADIOCEPHALIC ARTERIOVENOUS (AV) FISTULA CREATION;  Surgeon: [name], MD;  Location: [facility];  Service: Vascular;  Laterality: Left;   AV FISTULA PLACEMENT Left 02/02/2018   Procedure: ARTERIOVENOUS (AV) FISTULA CREATION LEFT UPPER EXTREMITY;  Surgeon: [name], MD;  Location: [place];  Service: Vascular;  Laterality: Left;   INSERTION OF DIALYSIS CATHETER  08/09/2017   INSERTION OF DIALYSIS CATHETER Right 08/09/2017   Procedure: INSERTION OF DIALYSIS CATHETER RIGHT INTERNAL JUGULAR;  Surgeon: [name], MD;  Location: [facility] OR;  Service: Vascular;  Laterality: Right;   IR DIALY SHUNT INTRO NEEDLE/INTRACATH INITIAL W/IMG LEFT Left 07/10/2020   IR DIALY SHUNT INTRO NEEDLE/INTRACATH INITIAL W/IMG LEFT Left 04/14/2021   IR REMOVAL TUN CV CATH W/O FL  07/01/2018   LEFT HEART CATH AND CORONARY ANGIOGRAPHY N/A 09/05/2018   Procedure: LEFT HEART CATH AND CORONARY ANGIOGRAPHY;  Surgeon: [name], MD;  Location: [place] [facility];  Service: Cardiovascular;  Laterality: N/A;   OPEN REDUCTION INTERNAL FIXATION (ORIF) DISTAL RADIAL FRACTURE Left 11/20/2021   Procedure: POSSIBLE OPEN REDUCTION INTERNAL FIXATION (ORIF) DISTAL RADIAL FRACTURE;  Surgeon: [name], MD;  Location: [place];  Service: Orthopedics;  Laterality: Left;   ORIF ANKLE FRACTURE Left 11/20/2021   Procedure: OPEN REDUCTION INTERNAL  FIXATION (ORIF) ANKLE PILON FRACTURE;  Surgeon: [name], MD;  Location: [place];  Service: Orthopedics;  Laterality: Left;   ORIF TIBIA PLATEAU Right 11/20/2021   Procedure: OPEN REDUCTION INTERNAL FIXATION (ORIF) TIBIAL PLATEAU;  Surgeon: [name], MD;  Location: [place];  Service: Orthopedics;  Laterality: Right;   Family History  Problem Relation Age of Onset   Diabetes Mother    Kidney disease Mother        renal failure--diabetes, cause of death   Hypertension Mother    Diabetes Father    Kidney disease Father        Cause of death:  kidney failure form diabetes   Alcohol abuse Father    Diabetes Sister    Social History:  reports that he has quit smoking. His smoking use included cigarettes. He has been exposed to tobacco smoke. He has never used smokeless tobacco. He reports that he does not currently use alcohol. He reports that he does not use drugs.  ROS: As per HPI otherwise negative.  Physical Exam: Vitals:   10/26/22 1527 10/26/22 1530 10/26/22 1545 10/26/22 1600  BP: (!) 148/71 (!) 156/88 (!) 162/64 (!) 162/68  Pulse: 68 69 68 68  Resp: 20 17 15 13   Temp: 97.9 F (36.6 C)     SpO2: 96% 97% 93% 97%  Weight:      Height:         General: Alert, tired appearing male in NAD Head: Normocephalic, atraumatic, sclera non-icteric, mucus membranes are moist. Neck: Ice pack present, unable to exam Lungs: Clear bilaterally to auscultation without wheezes, rales, or rhonchi. Breathing is unlabored on 2L O2 Heart: RRR with normal S1, S2. No murmurs, rubs, or gallops appreciated. Abdomen: Soft, non-distended, +BS Musculoskeletal:  Strength and tone appear normal for age. Lower extremities: No edema bilateral lower extremities Neuro: Alert and oriented X 3. Psych:  Responds to questions appropriately with a normal affect. Dialysis Access: LUE AVF + bruit  No Known Allergies Prior to Admission medications   Medication Sig Start Date End Date Taking? Authorizing Provider  acetaminophen (TYLENOL) 500 MG tablet Take 1,000 mg by mouth every 6 (six) hours as needed for moderate pain or headache.   Yes [provider]  amLODipine (NORVASC) 10 MG tablet Take 1 tablet (10 mg total) by mouth daily. 04/13/22  Yes  Turner, Eber Hong, MD  aspirin-acetaminophen-caffeine (EXCEDRIN MIGRAINE) [phone] MG tablet Take 1-2 tablets by mouth every 6 (six) hours as needed for headache.   Yes [provider]  carvedilol (COREG) 12.5 MG tablet Take 1 tablet (12.5 mg total) by mouth 2 (two) times daily. 04/13/22  Yes Turner, Eber Hong, MD  ezetimibe (ZETIA) 10 MG tablet Take 1 tablet (10 mg total) by mouth daily. 04/14/22  Yes Turner, Eber Hong, MD  glipiZIDE (GLUCOTROL) 10 MG tablet Take 10 mg by mouth daily before breakfast.   Yes [provider]  AgaMatrix Ultra-Thin Lancets MISC Check blood glucose twice daily before meals. 06/09/21   Fenton Foy, NP  Blood Glucose Monitoring Suppl [facility] PRESTO) w/Device KIT Check sugars twice daily 06/04/16   Mack Hook, MD  Blood Glucose Monitoring Suppl (TRUE METRIX METER) w/Device KIT Use as directed 12/10/21   [name], NP  glucose blood (TRUE METRIX BLOOD GLUCOSE TEST) test strip Use as instructed 12/10/21   [name], NP  lanthanum (FOSRENOL) 1000 MG chewable tablet Chew 2,000 mg by mouth 3 (three) times daily with meals.  [provider]  oxyCODONE-acetaminophen (PERCOCET) 5-325 MG tablet Take 1 tablet by mouth every 4 (four) hours as needed. Patient not taking: Reported on 10/23/2022 09/02/22   Larene Pickett, PA-C  rosuvastatin (CRESTOR) 40 MG tablet Take 1 tablet (40 mg total) by mouth daily. 02/25/22   Sueanne Margarita, MD  TRUEplus Lancets 28G MISC Use as directed 12/10/21   [name], NP   Current Facility-Administered Medications  Medication Dose Route Frequency Provider Last Rate Last Admin   0.9 %  sodium chloride infusion   Intravenous Continuous Annye Asa, MD   Stopped at 10/26/22 1524   Chlorhexidine Gluconate Cloth 2 % PADS 6 each  6 each Topical Once [name], MD       And   Chlorhexidine Gluconate Cloth 2 % PADS 6 each  6 each Topical Once [name], MD       HYDROmorphone (DILAUDID) 1 MG/ML  injection            HYDROmorphone (DILAUDID) 1 MG/ML injection            HYDROmorphone (DILAUDID) injection 0.25-0.5 mg  0.25-0.5 mg Intravenous Q5 min PRN Annye Asa, MD   0.5 mg at 10/26/22 1601   insulin aspart (novoLOG) injection 0-7 Units  0-7 Units Subcutaneous Q2H PRN Annye Asa, MD       lactated ringers infusion   Intravenous Continuous Annye Asa, MD       meperidine (DEMEROL) injection 6.25-12.5 mg  6.25-12.5 mg Intravenous Q5 min PRN Annye Asa, MD       midazolam (VERSED) injection 0.5-2 mg  0.5-2 mg Intravenous Once PRN Annye Asa, MD       oxyCODONE (Oxy IR/ROXICODONE) immediate release tablet 5 mg  5 mg Oral Once PRN Annye Asa, MD       Or   oxyCODONE (ROXICODONE) 5 MG/5ML solution 5 mg  5 mg Oral Once PRN Annye Asa, MD       promethazine (PHENERGAN) injection 6.25-12.5 mg  6.25-12.5 mg Intravenous Q15 min PRN Annye Asa, MD       Labs: Basic Metabolic Panel: Recent Labs  Lab 10/26/22 1028  NA 136  K 4.7  CL 99  GLUCOSE 117*  BUN 51*  CREATININE 10.90*    CBC: Recent Labs  Lab 10/26/22 1028  HGB 12.6*  HCT 37.0*    CBG: Recent Labs  Lab 10/26/22 1003 10/26/22 1213 10/26/22 1531  GLUCAP 111* 116* 139*     Outpatient Dialysis Orders:  Center: [facility]  on TTS. 180NRe 3hr 14min BFR 400 DFR 800 EDW 62kg 2K 2Ca AVF 15g Heparin 2000 unit bolus  Sensipar 180mg  daily at home Forsenol 1000mg  2 tabs TID [facility]  Assessment/Plan:  S/p parathyroidectomy: S/p parathyroidectomy and transplant 10/26/22. Will monitor calcium levels closely, q4 hours overnight. Started calcitriol 0.5mg  BID. Will stop his home sensipar. Will need intermittent bolus calcium gluconate PRN based on calcium levels.   ESRD:  Attends dialysis on TTS schedule. No urgent indications for dialysis today, will plan for HD tomorrow per regular schedule.  Hypertension/volume: BP elevated, no signs of volume overload on exam. Below his outpatient  EDW if weights here are correct. Will restart his amlodipine 10mg  QHS. Unclear which beta blocker he was taking at home, recommend carvedilol 12.5mg  BID to start if BP remains elevated.   Anemia: Hgb at goal, no indication for ESA. Will continue to monitor.   Metabolic bone disease: S/p parathyroidectomy, see above. Will continue fosrenol as phosphorus binder.  T2DM: management per primary team  Anice Paganini, PA-C 10/26/2022, 4:18 PM  [facility] Pager: [phone]

## 2022-10-26 NOTE — Op Note (Signed)
Operative Note  Pre-operative Diagnosis:  secondary hyperparathyroidism of renal origin  Post-operative Diagnosis:  same  Surgeon:  [name], MD  Assistant:  none   Procedure:  total parathyroidectomy (4 glands) with autotransplant to right forearm (brachioradialis muscle)  Anesthesia:  general  Estimated Blood Loss:  10 cc  Drains: none         Specimen: to pathology  Indications:  Patient is referred by Dr. Madelon Lips for surgical evaluation and management of secondary hyperparathyroidism of renal origin. Patient has end-stage renal disease and is on hemodialysis. He dialyzes at the [facility]. His access is in his left upper arm. Patient has complained of bone and joint pain. Recent laboratory studies show a elevated intact PTH level of 2830. Total calcium is 10.0. Phosphorus is 7.0. Calcium times phosphorus product is elevated at 70. Patient has had no prior head or neck surgery. Patient is interviewed today accompanied by his wife and with the use of a translator by telephone.   Procedure:  The patient was seen in the pre-op holding area. The risks, benefits, complications, treatment options, and expected outcomes were previously discussed with the patient. The patient agreed with the proposed plan and has signed the informed consent form.  The patient was brought to the operating room by the surgical team, identified as Blake Santana and the procedure verified. A "time out" was completed and the above information confirmed.  Following administration of general anesthesia, the patient was positioned and then prepped and draped in usual aseptic fashion.  After ascertaining that an adequate level of anesthesia been achieved, a Kocher incision is made with a #15 blade.  Dissection was carried through subcutaneous tissues and platysma.  Hemostasis is achieved with the electrocautery.  Skin flaps were elevated cephalad and caudad.  A self-retaining  retractors placed for exposure.  Strap muscles are incised in the midline.  Dissection has begun on the left side.  Left thyroid lobe is exposed.  Left thyroid lobe was mobilized.  A left superior parathyroid gland is identified posterior to the superior pole of the thyroid alongside of the hypopharynx and proximal esophagus adjacent to the precervical fascia.  This is gently mobilized.  There is carefully excised using the harmonic scalpel for hemostasis.  The entire gland is excised.  A portion of the gland is excised with a #15 blade and submitted to pathology where frozen section biopsy confirms hypercellular parathyroid tissue.  Remainder of the gland is placed in iced saline on the back table.  Further dissection on the left side reveals an enlarged parathyroid gland just below and posterior to the inferior pole of the thyroid.  This gland is also moderately enlarged.  It is gently dissected out.  Vascular structures are divided between small ligaclips with the harmonic scalpel.  The entire gland is excised.  A portion of the gland is excised with a #15 blade and submitted to pathology where frozen section again confirms hypercellular parathyroid tissue.  The remainder of the gland is placed in iced saline on the back table.  Next we turned our attention to the right side.  Strap muscles are again reflected laterally.  Right thyroid lobe was mobilized.  An enlarged parathyroid gland is noted at the right inferior position.  This is just inferior to and posterior to the inferior pole of the thyroid lobe.  Again this gland is dissected out.  Vascular structures are divided between small ligaclips using the harmonic scalpel.  Gland is completely  excised.  A fragment of the gland is excised with a #15 blade and submitted to pathology where frozen section biopsy confirms hypercellular parathyroid tissue.  The remainder of the gland is placed in iced saline on the back table.  Further dissection on the right  allows for complete mobilization of the right thyroid lobe.  The right superior parathyroid gland again is enlarged.  It is located posterior to the superior pole of the right lobe of the thyroid adjacent to the esophagus and lying against the precervical fascia.  It is gently mobilized.  Vascular structures are divided between ligaclips with the harmonic scalpel and the gland is excised.  Biopsy is submitted to pathology confirming hypercellular parathyroid tissue.  The remainder of the gland is placed in iced saline on the back table.  Neck is irrigated with warm saline.  Good hemostasis is noted throughout the operative field.  Fibrillar was placed throughout the operative field.  Strap muscles are reapproximated in the midline with interrupted 3-0 Vicryl sutures.  Platysma was closed with interrupted 3-0 Vicryl sutures.  Skin was closed with a running 4-0 Monocryl subcuticular suture.  Wound was washed and dried and Dermabond is applied as dressing.  Next the operative field was reset to the right forearm.  The right forearm is placed on a vascular armboard.  The skin is prepped and then draped in the usual aseptic fashion.  After ascertaining that an adequate level of anesthesia been maintained, a second timeout is performed.  The left inferior parathyroid gland is selected for obtaining tissue fragments for transplantation.  1 mm fragments are created from the tissue using a #15 blade.  4 fragments are placed in iced saline.  The right superior gland is also used to obtain tissue fragments.  4 additional 1 mm fragments are created.  A total of 8 fragments of parathyroid tissue will be transplanted.  The remainder of the parathyroid tissue will be submitted to pathology for permanent review.  Incision is made over the right brachial radialis muscle with a #15 blade.  Dissection was carried through subcutaneous tissues and hemostasis achieved with the electrocautery.  Skin flaps are developed  circumferentially and a self-retaining retractors placed.  Using a #15 blade an incision is made in the muscle fascia.  Using a mosquito hemostat a submuscular pocket is created and a 1 mm fragment of parathyroid tissue was placed into the muscle and secured with an overlying 4-0 Prolene simple suture.  This exercises repeated 8 times.  Good hemostasis is noted.  Subcutaneous tissues were then closed with interrupted 3-0 Vicryl sutures.  Skin is closed with a running 4-0 Monocryl subcuticular suture.  Wound was washed and dried and Dermabond is placed as dressing.  Patient is awakened from anesthesia and transported to the recovery room in stable condition.  The patient tolerated the procedure well.   [name], [facility] Office: [phone]

## 2022-10-26 NOTE — Anesthesia Postprocedure Evaluation (Signed)
Anesthesia Post Note  Patient: Neeraj Pedicini [place]vedo[place]  Procedure(s) Performed: total PARATHYROIDECTOMY (Neck) AUTOTRANSPLANT TO RIGHT FOREARM (Right: Arm Lower)     Patient location during evaluation: PACU Anesthesia Type: General Level of consciousness: awake and alert, patient cooperative and oriented Pain management: pain level controlled Vital Signs Assessment: post-procedure vital signs reviewed and stable Respiratory status: spontaneous breathing, nonlabored ventilation and respiratory function stable Cardiovascular status: blood pressure returned to baseline and stable (pt to resume BP med schedule) Postop Assessment: no apparent nausea or vomiting Anesthetic complications: no   No notable events documented.  Last Vitals:  Vitals:   10/26/22 1615 10/26/22 1630  BP: (!) 170/75 (!) 180/77  Pulse: 70 71  Resp: 18 11  Temp:    SpO2: 97% 96%    Last Pain:  Vitals:   10/26/22 1630  PainSc: 4                  Shadara Lopez,E. Anabel Lykins

## 2022-10-26 NOTE — Transfer of Care (Signed)
Immediate Anesthesia Transfer of Care Note  Patient: Blake Santana  Procedure(s) Performed: total PARATHYROIDECTOMY (Neck) AUTOTRANSPLANT TO RIGHT FOREARM (Right: Arm Lower)  Patient Location: PACU  Anesthesia Type:General  Level of Consciousness: drowsy and patient cooperative  Airway & Oxygen Therapy: Patient Spontanous Breathing and Patient connected to nasal cannula oxygen  Post-op Assessment: Report given to RN and Post -op Vital signs reviewed and stable  Post vital signs: Reviewed and stable  Last Vitals:  Vitals Value Taken Time  BP 148/71 10/26/22 1527  Temp    Pulse 68 10/26/22 1529  Resp 21 10/26/22 1529  SpO2 97 % 10/26/22 1529  Vitals shown include unvalidated device data.  Last Pain:  Vitals:   10/26/22 1055  PainSc: 5       Patients Stated Pain Goal: 3 ([id number])  Complications: No notable events documented.

## 2022-10-26 NOTE — Progress Notes (Signed)
Dr. Harlow Asa made aware that patient has approximately 5 open areas on his right lower forearm. Per the patient he was working on a car about 10 days ago and got burned from a metal pipe. Per the patient he did not know he was getting burned as he was working. Dr. Harlow Asa stated he will be over to Short Stay to assess the patient.

## 2022-10-26 NOTE — Anesthesia Procedure Notes (Signed)
Procedure Name: Intubation Date/Time: 10/26/2022 12:48 PM  Performed by: Mosetta Pigeon, CRNAPre-anesthesia Checklist: Patient identified, Emergency Drugs available, Suction available and Patient being monitored Patient Re-evaluated:Patient Re-evaluated prior to induction Oxygen Delivery Method: Circle System Utilized Preoxygenation: Pre-oxygenation with 100% oxygen Induction Type: IV induction Ventilation: Mask ventilation without difficulty Laryngoscope Size: Mac and 4 Grade View: Grade II Tube type: Oral Number of attempts: 1 Airway Equipment and Method: Stylet Placement Confirmation: ETT inserted through vocal cords under direct vision, positive ETCO2 and breath sounds checked- equal and bilateral Secured at: 23 cm Tube secured with: Tape Dental Injury: Teeth and Oropharynx as per pre-operative assessment

## 2022-10-26 NOTE — Anesthesia Preprocedure Evaluation (Addendum)
Anesthesia Evaluation  Patient identified by MRN, date of birth, ID band Patient awake    Reviewed: Allergy & Precautions, NPO status , Patient's Chart, lab work & pertinent test results, reviewed documented beta blocker date and time   History of Anesthesia Complications Negative for: history of anesthetic complications  Airway Mallampati: II  TM Distance: >3 FB Neck ROM: Full    Dental  (+) Loose, Poor Dentition, Missing, Dental Advisory Given, Chipped   Pulmonary neg shortness of breath, former smoker   breath sounds clear to auscultation       Cardiovascular hypertension, Pt. on medications and Pt. on home beta blockers (-) angina + CAD (non-obstructive by cath)   Rhythm:Regular Rate:Normal  '20 ECHO: EF 55%. 1. The cavity size is normal. There is moderate left ventricular wall thickness. Echo evidence of Indeterminate diastolic filling patterns.   2. Normal left atrial size.   3. Normal right atrial size.   4. Trivial pericardial effusion, as described above.   5. The mitral valve, mild thickening. Regurgitation is mild by color flow Doppler.   6. Normal tricuspid valve. Tricuspid regurgitation is mild.   7. The aortic valve tricuspid. There is mild thickening of the aortic valve.     Neuro/Psych  Headaches    GI/Hepatic negative GI ROS, Neg liver ROS,,,  Endo/Other  diabetes (glu 111), Oral Hypoglycemic Agents    Renal/GU Dialysis and ESRFRenal disease (K+ 4.7, TuThSa dialysis)     Musculoskeletal   Abdominal   Peds  Hematology negative hematology ROS (+)   Anesthesia Other Findings   Reproductive/Obstetrics                             Anesthesia Physical Anesthesia Plan  ASA: 3  Anesthesia Plan: General   Post-op Pain Management: Tylenol PO (pre-op)*   Induction: Intravenous  PONV Risk Score and Plan: 2 and Ondansetron, Dexamethasone and Treatment may vary due to age or  medical condition  Airway Management Planned: Oral ETT  Additional Equipment: None  Intra-op Plan:   Post-operative Plan: Extubation in OR  Informed Consent: I have reviewed the patients History and Physical, chart, labs and discussed the procedure including the risks, benefits and alternatives for the proposed anesthesia with the patient or authorized representative who has indicated his/her understanding and acceptance.     Dental advisory given  Plan Discussed with: CRNA and Surgeon  Anesthesia Plan Comments:         Anesthesia Quick Evaluation

## 2022-10-27 ENCOUNTER — Encounter (HOSPITAL_COMMUNITY): Payer: Self-pay | Admitting: Surgery

## 2022-10-27 LAB — CBC
HCT: 32.3 % — ABNORMAL LOW (ref 39.0–52.0)
HCT: 32.4 % — ABNORMAL LOW (ref 39.0–52.0)
Hemoglobin: 10.7 g/dL — ABNORMAL LOW (ref 13.0–17.0)
Hemoglobin: 10.6 g/dL — ABNORMAL LOW (ref 13.0–17.0)
MCH: 30.9 pg (ref 26.0–34.0)
MCH: 30.6 pg (ref 26.0–34.0)
MCHC: 33.1 g/dL (ref 30.0–36.0)
MCHC: 32.7 g/dL (ref 30.0–36.0)
MCV: 93.4 fL (ref 80.0–100.0)
MCV: 93.6 fL (ref 80.0–100.0)
Platelets: 191 10*3/uL (ref 150–400)
Platelets: 191 10*3/uL (ref 150–400)
RBC: 3.46 MIL/uL — ABNORMAL LOW (ref 4.22–5.81)
RBC: 3.46 MIL/uL — ABNORMAL LOW (ref 4.22–5.81)
RDW: 15.1 % (ref 11.5–15.5)
RDW: 15.2 % (ref 11.5–15.5)
WBC: 9.1 10*3/uL (ref 4.0–10.5)
WBC: 9.8 10*3/uL (ref 4.0–10.5)
nRBC: 0 % (ref 0.0–0.2)
nRBC: 0 % (ref 0.0–0.2)

## 2022-10-27 LAB — GLUCOSE, CAPILLARY
Glucose-Capillary: 64 mg/dL — ABNORMAL LOW (ref 70–99)
Glucose-Capillary: 139 mg/dL — ABNORMAL HIGH (ref 70–99)
Glucose-Capillary: 85 mg/dL (ref 70–99)

## 2022-10-27 LAB — RENAL FUNCTION PANEL
Albumin: 3.2 g/dL — ABNORMAL LOW (ref 3.5–5.0)
Anion gap: 19 — ABNORMAL HIGH (ref 5–15)
BUN: 75 mg/dL — ABNORMAL HIGH (ref 6–20)
CO2: 24 mmol/L (ref 22–32)
Calcium: 6.9 mg/dL — ABNORMAL LOW (ref 8.9–10.3)
Chloride: 93 mmol/L — ABNORMAL LOW (ref 98–111)
Creatinine, Ser: 10.52 mg/dL — ABNORMAL HIGH (ref 0.61–1.24)
GFR, Estimated: 5 mL/min — ABNORMAL LOW
Glucose, Bld: 183 mg/dL — ABNORMAL HIGH (ref 70–99)
Phosphorus: 6.1 mg/dL — ABNORMAL HIGH (ref 2.5–4.6)
Potassium: 7.1 mmol/L (ref 3.5–5.1)
Sodium: 136 mmol/L (ref 135–145)

## 2022-10-27 LAB — CALCIUM
Calcium: 7.5 mg/dL — ABNORMAL LOW (ref 8.9–10.3)
Calcium: 6.7 mg/dL — ABNORMAL LOW (ref 8.9–10.3)
Calcium: 8 mg/dL — ABNORMAL LOW (ref 8.9–10.3)
Calcium: 7.1 mg/dL — ABNORMAL LOW (ref 8.9–10.3)
Calcium: 6.8 mg/dL — ABNORMAL LOW (ref 8.9–10.3)

## 2022-10-27 LAB — HEMOGLOBIN A1C
Hgb A1c MFr Bld: 6.6 % — ABNORMAL HIGH (ref 4.8–5.6)
Mean Plasma Glucose: 143 mg/dL

## 2022-10-27 LAB — HEPATITIS B SURFACE ANTIBODY, QUANTITATIVE: Hep B S AB Quant (Post): 18.5 m[IU]/mL

## 2022-10-27 LAB — SURGICAL PATHOLOGY

## 2022-10-27 MED ORDER — HEPARIN SODIUM (PORCINE) 1000 UNIT/ML DIALYSIS: 1000.0000 [IU] | INTRAMUSCULAR | Status: DC | PRN

## 2022-10-27 MED ORDER — ALTEPLASE 2 MG IJ SOLR: 2.0000 mg | Freq: Once | INTRAMUSCULAR | Status: DC | PRN

## 2022-10-27 MED ORDER — LIDOCAINE HCL (PF) 1 % IJ SOLN: 5.0000 mL | INTRAMUSCULAR | Status: DC | PRN

## 2022-10-27 MED ORDER — ANTICOAGULANT SODIUM CITRATE 4% (200MG/5ML) IV SOLN: 5.0000 mL | Status: DC | PRN

## 2022-10-27 MED ORDER — CALCIUM GLUCONATE-NACL 1-0.675 GM/50ML-% IV SOLN
1.0000 g | Freq: Once | INTRAVENOUS | Status: AC
  Administered 2022-10-27: 1000 mg via INTRAVENOUS
  Filled 2022-10-27: qty 50

## 2022-10-27 MED ORDER — PENTAFLUOROPROP-TETRAFLUOROETH EX AERO: 1.0000 | INHALATION_SPRAY | CUTANEOUS | Status: DC | PRN

## 2022-10-27 MED ORDER — CALCIUM GLUCONATE-NACL 2-0.675 GM/100ML-% IV SOLN
2.0000 g | Freq: Once | INTRAVENOUS | Status: AC
  Administered 2022-10-27: 2000 mg via INTRAVENOUS
  Filled 2022-10-27: qty 100

## 2022-10-27 MED ORDER — LIDOCAINE-PRILOCAINE 2.5-2.5 % EX CREA: 1.0000 | TOPICAL_CREAM | CUTANEOUS | Status: DC | PRN

## 2022-10-27 NOTE — Progress Notes (Signed)
Received patient in bed to unit.  Alert and oriented.  Informed consent signed and in chart.   TX duration:3.75  Patient tolerated well.  Transported back to the room  Alert, without acute distress.  Hand-off given to patient's nurse.   Access used: LEFT AVF Access issues: NONE  Total UF removed: 2L Medication(s) given: CALCIUM GLUCONATE, CALCITRIOL   10/27/22 1208  Vitals  BP 113/60  MAP (mmHg) 75  BP Location Left Leg  BP Method Automatic  Patient Position (if appropriate) Lying  Pulse Rate 73  Pulse Rate Source Monitor  Resp 15  Oxygen Therapy  SpO2 98 %  O2 Device Nasal Cannula  O2 Flow Rate (L/min) 2.5 L/min  During Treatment Monitoring  HD Safety Checks Performed Yes  Intra-Hemodialysis Comments Tx completed;Tolerated well  Dialysis Fluid Bolus Normal Saline  Bolus Amount (mL) 300 mL      [facility]

## 2022-10-27 NOTE — Progress Notes (Signed)
Assessment & Plan: POD#1 - status post total parathyroidectomy with autotransplant to right forearm  - stable post op without obvious complications  - pain controlled  - plan HD today per nephrology  - OK to use right upper extremity for IV access  Home when stable from nephrology standpoint.  Will follow.        [name], MD [facility] [place] [facility] Office: [phone]        Chief Complaint: Secondary hyperparathyroidism of renal origin  Subjective: Patient in bed, comfortable.  Mild pain.  Objective: Vital signs in last 24 hours: Temp:  [97.6 F (36.4 C)-98.6 F (37 C)] 98.1 F (36.7 C) (03/26 0451) Pulse Rate:  [68-78] 71 (03/26 0451) Resp:  [11-20] 18 (03/26 0451) BP: (107-201)/(46-88) 107/46 (03/26 0451) SpO2:  [93 %-100 %] 93 % (03/26 0451) Weight:  [60 kg-63.7 kg] 63.7 kg (03/25 1732) Last BM Date : 10/25/22  Intake/Output from previous day: 03/25 0701 - 03/26 0700 In: 1050 [P.O.:250; I.V.:800] Out: 20 [Blood:20] Intake/Output this shift: No intake/output data recorded.  Physical Exam: HEENT - sclerae clear, mucous membranes moist Neck - wound dry and intact; mild STS; voice normal; Dermabond in place Ext - right forearm wound dry and intact with Dermabond in place Neuro - alert & oriented, no focal deficits  Lab Results:  Recent Labs    10/26/22 1028  HGB 12.6*  HCT 37.0*   BMET Recent Labs    10/26/22 1028 10/26/22 1742 10/27/22 0029 10/27/22 0434  NA 136  --   --  136  K 4.7  --   --  7.1*  CL 99  --   --  93*  CO2  --   --   --  24  GLUCOSE 117*  --   --  183*  BUN 51*  --   --  75*  CREATININE 10.90*  --   --  10.52*  CALCIUM  --    < > 7.5* 6.9*   < > = values in this interval not displayed.   PT/INR No results for input(s): "LABPROT", "INR" in the last 72 hours. Comprehensive Metabolic Panel:    Component Value Date/Time   NA 136 10/27/2022 0434   NA 136 10/26/2022 1028   NA 142 01/15/2021 1223    NA 138 11/15/2019 1014   K 7.1 (HH) 10/27/2022 0434   K 4.7 10/26/2022 1028   CL 93 (L) 10/27/2022 0434   CL 99 10/26/2022 1028   CO2 24 10/27/2022 0434   CO2 29 11/27/2021 0426   BUN 75 (H) 10/27/2022 0434   BUN 51 (H) 10/26/2022 1028   BUN 44 (H) 01/15/2021 1223   BUN 34 (H) 11/15/2019 1014   CREATININE 10.52 (H) 10/27/2022 0434   CREATININE 10.90 (H) 10/26/2022 1028   GLUCOSE 183 (H) 10/27/2022 0434   GLUCOSE 117 (H) 10/26/2022 1028   CALCIUM 6.9 (L) 10/27/2022 0434   CALCIUM 7.5 (L) 10/27/2022 0029   AST 7 06/03/2022 1034   AST 19 11/17/2021 1634   ALT 7 06/03/2022 1034   ALT 6 04/13/2022 1028   ALKPHOS 474 (H) 06/03/2022 1034   ALKPHOS 289 (H) 11/17/2021 1634   BILITOT 0.3 06/03/2022 1034   BILITOT 0.7 11/17/2021 1634   BILITOT 0.3 01/15/2021 1223   PROT 7.2 06/03/2022 1034   PROT 7.3 11/17/2021 1634   PROT 7.6 01/15/2021 1223   ALBUMIN 3.2 (L) 10/27/2022 0434   ALBUMIN 3.6 10/26/2022 1742  ALBUMIN 4.2 06/03/2022 1034   ALBUMIN 4.4 01/15/2021 1223    Studies/Results: No results found.    [name] 10/27/2022  Patient ID: Blake Santana, male   DOB: 1971-05-30, 52 y.o.   MRN: [id number]

## 2022-10-27 NOTE — Plan of Care (Signed)

## 2022-10-27 NOTE — Progress Notes (Signed)
Lab called that critical result of potassium 7.1. no symptoms noted. Pt will have hemodialysis today.

## 2022-10-27 NOTE — Progress Notes (Signed)
Handoff report was given to HD nurse. Received order for calcium gluconate to be given at HD. Medication tube to unit.

## 2022-10-27 NOTE — Procedures (Signed)
I was present at this dialysis session. I have reviewed the session itself and made appropriate changes.   Patient was some hypocalcemia.  Ordered 2 g calcium gluconate but no IV active that could be used for administration.  Receiving with dialysis today.  Surgery has okayed use of his right upper extremity for IV access which will help with administration in the future.  Continue calcitriol, Tums, monitoring calcium closely today and administration of IV calcium as needed.  Filed Weights   10/26/22 1732 10/27/22 0756 10/27/22 0801  Weight: 63.7 kg 66.5 kg 66.5 kg    Recent Labs  Lab 10/27/22 0434 10/27/22 0811  NA 136  --   K 7.1*  --   CL 93*  --   CO2 24  --   GLUCOSE 183*  --   BUN 75*  --   CREATININE 10.52*  --   CALCIUM 6.9* 6.7*  PHOS 6.1*  --     Recent Labs  Lab 10/26/22 1028 10/27/22 0700 10/27/22 0810  WBC  --  9.1 9.8  HGB 12.6* 10.7* 10.6*  HCT 37.0* 32.3* 32.4*  MCV  --  93.4 93.6  PLT  --  191 191    Scheduled Meds:  amLODipine  10 mg Oral QHS   calcitRIOL  0.5 mcg Oral BID   calcium carbonate  3 tablet Oral TID   carvedilol  12.5 mg Oral BID WC   Chlorhexidine Gluconate Cloth  6 each Topical Q0600   glipiZIDE  10 mg Oral QAC breakfast   insulin aspart  0-15 Units Subcutaneous TID WC   lanthanum  2,000 mg Oral TID WC   Continuous Infusions:  sodium chloride 10 mL/hr at 10/26/22 1723   anticoagulant sodium citrate     calcium gluconate     PRN Meds:.acetaminophen **OR** acetaminophen, alteplase, anticoagulant sodium citrate, heparin, HYDROmorphone (DILAUDID) injection, lidocaine (PF), lidocaine-prilocaine, ondansetron **OR** ondansetron (ZOFRAN) IV, oxyCODONE, pentafluoroprop-tetrafluoroeth, traMADol   Santiago Bumpers,  MD 10/27/2022, 10:07 AM

## 2022-10-27 NOTE — Progress Notes (Signed)
Pt receives out-pt HD at [facility] on TTS. Will assist as needed.   Kendrik Mcshan Renal Navigator [phone] 

## 2022-10-27 NOTE — Progress Notes (Signed)
PIV consult for EJ insertion: Instructed RN to contact rapid response or ED charge RN for EJ trained nurse to insert line. None available on VAST this shift.

## 2022-10-28 ENCOUNTER — Encounter (HOSPITAL_COMMUNITY): Payer: Self-pay

## 2022-10-28 LAB — RENAL FUNCTION PANEL
Albumin: 3 g/dL — ABNORMAL LOW (ref 3.5–5.0)
Anion gap: 16 — ABNORMAL HIGH (ref 5–15)
BUN: 35 mg/dL — ABNORMAL HIGH (ref 6–20)
CO2: 28 mmol/L (ref 22–32)
Calcium: 6.4 mg/dL — CL (ref 8.9–10.3)
Chloride: 91 mmol/L — ABNORMAL LOW (ref 98–111)
Creatinine, Ser: 6.77 mg/dL — ABNORMAL HIGH (ref 0.61–1.24)
GFR, Estimated: 9 mL/min — ABNORMAL LOW (ref >60)
Glucose, Bld: 96 mg/dL (ref 70–99)
Phosphorus: 4.2 mg/dL (ref 2.5–4.6)
Potassium: 4.4 mmol/L (ref 3.5–5.1)
Sodium: 135 mmol/L (ref 135–145)

## 2022-10-28 LAB — GLUCOSE, CAPILLARY
Glucose-Capillary: 107 mg/dL — ABNORMAL HIGH (ref 70–99)
Glucose-Capillary: 126 mg/dL — ABNORMAL HIGH (ref 70–99)
Glucose-Capillary: 168 mg/dL — ABNORMAL HIGH (ref 70–99)
Glucose-Capillary: 80 mg/dL (ref 70–99)

## 2022-10-28 LAB — CALCIUM
Calcium: 6.6 mg/dL — ABNORMAL LOW (ref 8.9–10.3)
Calcium: 6.2 mg/dL — CL (ref 8.9–10.3)
Calcium: 6.7 mg/dL — ABNORMAL LOW (ref 8.9–10.3)
Calcium: 6.3 mg/dL — CL (ref 8.9–10.3)

## 2022-10-28 LAB — PARATHYROID HORMONE, INTACT (NO CA): PTH: 56 pg/mL (ref 15–65)

## 2022-10-28 MED ORDER — SODIUM CHLORIDE 0.9 % IV SOLN
4.0000 g | Freq: Once | INTRAVENOUS | Status: AC
  Administered 2022-10-28: 4 g via INTRAVENOUS
  Filled 2022-10-28 (×2): qty 40

## 2022-10-28 MED ORDER — CALCIUM GLUCONATE-NACL 2-0.675 GM/100ML-% IV SOLN
2.0000 g | Freq: Once | INTRAVENOUS | Status: AC
  Administered 2022-10-28: 2000 mg via INTRAVENOUS
  Filled 2022-10-28: qty 100

## 2022-10-28 MED ORDER — CALCITRIOL 0.5 MCG PO CAPS
1.0000 ug | ORAL_CAPSULE | Freq: Two times a day (BID) | ORAL | Status: DC
  Administered 2022-10-28 (×2): 1 ug via ORAL
  Filled 2022-10-28 (×2): qty 2

## 2022-10-28 MED ORDER — CHLORHEXIDINE GLUCONATE CLOTH 2 % EX PADS
6.0000 | MEDICATED_PAD | Freq: Every day | CUTANEOUS | Status: DC
  Administered 2022-10-28 – 2022-10-31 (×3): 6 via TOPICAL

## 2022-10-28 MED ORDER — SODIUM CHLORIDE 0.9 % IV SOLN: 4.0000 g | Freq: Once | INTRAVENOUS | Status: DC

## 2022-10-28 NOTE — Inpatient Diabetes Management (Signed)
Inpatient Diabetes Program Recommendations  AACE/ADA: New Consensus Statement on Inpatient Glycemic Control (2015)  Target Ranges:  Prepandial:   less than 140 mg/dL      Peak postprandial:   less than 180 mg/dL (1-2 hours)      Critically ill patients:  140 - 180 mg/dL   Lab Results  Component Value Date   GLUCAP 126 (H) 10/28/2022   HGBA1C 6.6 (H) 10/26/2022    Latest Reference Range & Units 10/26/22 12:13 10/26/22 15:31 10/26/22 17:25 10/26/22 20:23 10/27/22 13:25 10/27/22 16:43 10/27/22 20:38 10/28/22 08:13 10/28/22 11:43  Glucose-Capillary 70 - 99 mg/dL 116 (H) 139 (H) 194 (H) 247 (H) 64 (L) Glipizide given @ 0736 am 139 (H) Novolog 2 units 85 107 (H) 126 (H) Novolog 2 units  (H): Data is abnormally high (L): Data is abnormally low  Diabetes history: DM2 Outpatient Diabetes medications: Glucotrol 10 mg qd Current orders for Inpatient glycemic control: Glucotrol 10 mg qd, Novolog 0-15 units tid correction  Inpatient Diabetes Program Recommendations:   Please consider while in the hospital. -D/C Glucotrol -Decrease Novolog correction to 0-6 units tid  Thank you, Nani Gasser. Adele Milson, RN, MSN, CDE  Diabetes Coordinator Inpatient Glycemic Control Team Team Pager [phone] (8am-5pm) 10/28/2022 1:08 PM

## 2022-10-28 NOTE — Progress Notes (Signed)
Assessment & Plan: POD#2 - status post total parathyroidectomy with autotransplant to right forearm             - stable post op without obvious complications             - pain controlled             - HD yesterday  - calcium down to 6.6 mg/dl this AM             - OK to use right upper extremity for IV access   Home when stable from nephrology standpoint.  Will follow.        [name], MD [facility] [place] practice Office: [phone]        Chief Complaint: Secondary hyperparathyroidism  Subjective: Patient in bed, comfortable.  No complaints.  Objective: Vital signs in last 24 hours: Temp:  [98 F (36.7 C)-98.9 F (37.2 C)] 98.4 F (36.9 C) (03/27 0540) Pulse Rate:  [65-76] 66 (03/27 0540) Resp:  [11-24] 16 (03/26 2037) BP: (93-131)/(45-78) 131/55 (03/27 0540) SpO2:  [92 %-98 %] 98 % (03/27 0540) Weight:  [64.3 kg-66.5 kg] 64.3 kg (03/26 1227) Last BM Date : 10/25/22  Intake/Output from previous day: 03/26 0701 - 03/27 0700 In: 510 [P.O.:510] Out: 2150 [Urine:150] Intake/Output this shift: No intake/output data recorded.  Physical Exam: HEENT - sclerae clear, mucous membranes moist Neck - wound dry and intact; mild STS; voice normal Ext - right forearm wound dry and intact Neuro - alert & oriented, no focal deficits  Lab Results:  Recent Labs    10/27/22 0700 10/27/22 0810  WBC 9.1 9.8  HGB 10.7* 10.6*  HCT 32.3* 32.4*  PLT 191 191   BMET Recent Labs    10/26/22 1028 10/26/22 1742 10/27/22 0434 10/27/22 0811 10/27/22 2024 10/28/22 0029  NA 136  --  136  --   --   --   K 4.7  --  7.1*  --   --   --   CL 99  --  93*  --   --   --   CO2  --   --  24  --   --   --   GLUCOSE 117*  --  183*  --   --   --   BUN 51*  --  75*  --   --   --   CREATININE 10.90*  --  10.52*  --   --   --   CALCIUM  --    < > 6.9*   < > 6.8* 6.6*   < > = values in this interval not displayed.   PT/INR No results for input(s): "LABPROT",  "INR" in the last 72 hours. Comprehensive Metabolic Panel:    Component Value Date/Time   NA 136 10/27/2022 0434   NA 136 10/26/2022 1028   NA 142 01/15/2021 1223   NA 138 11/15/2019 1014   K 7.1 (HH) 10/27/2022 0434   K 4.7 10/26/2022 1028   CL 93 (L) 10/27/2022 0434   CL 99 10/26/2022 1028   CO2 24 10/27/2022 0434   CO2 29 11/27/2021 0426   BUN 75 (H) 10/27/2022 0434   BUN 51 (H) 10/26/2022 1028   BUN 44 (H) 01/15/2021 1223   BUN 34 (H) 11/15/2019 1014   CREATININE 10.52 (H) 10/27/2022 0434   CREATININE 10.90 (H) 10/26/2022 1028   GLUCOSE 183 (H) 10/27/2022 0434   GLUCOSE 117 (H) 10/26/2022 1028  CALCIUM 6.6 (L) 10/28/2022 0029   CALCIUM 6.8 (L) 10/27/2022 2024   AST 7 06/03/2022 1034   AST 19 11/17/2021 1634   ALT 7 06/03/2022 1034   ALT 6 04/13/2022 1028   ALKPHOS 474 (H) 06/03/2022 1034   ALKPHOS 289 (H) 11/17/2021 1634   BILITOT 0.3 06/03/2022 1034   BILITOT 0.7 11/17/2021 1634   BILITOT 0.3 01/15/2021 1223   PROT 7.2 06/03/2022 1034   PROT 7.3 11/17/2021 1634   PROT 7.6 01/15/2021 1223   ALBUMIN 3.2 (L) 10/27/2022 0434   ALBUMIN 3.6 10/26/2022 1742   ALBUMIN 4.2 06/03/2022 1034   ALBUMIN 4.4 01/15/2021 1223    Studies/Results: No results found.    [name] 10/28/2022  Patient ID: Thornell Mule Acevedo-Ocejo, male   DOB: 1970/12/10, 52 y.o.   MRN: [id number]

## 2022-10-28 NOTE — Progress Notes (Signed)
[facility] Progress Note   Subjective:   Calcium 6.4 this AM, 2g calcium gluconate ordered. Reports some neck pain but otherwise feeling well. Denies SOB, CP, dizziness, nausea, numbness and tingling.   Objective Vitals:   10/27/22 1642 10/27/22 2037 10/28/22 0540 10/28/22 0744  BP: (!) 117/45 104/62 (!) 131/55 (!) 100/41  Pulse: 74 69 66 66  Resp: 17 16  16   Temp: 98.9 F (37.2 C) 98.3 F (36.8 C) 98.4 F (36.9 C) 98.1 F (36.7 C)  TempSrc: Oral Oral Oral Oral  SpO2: 93% 98% 98% 92%  Weight:      Height:       Physical Exam General: Alert male in NAD Heart: RRR, no murmurs, rubs or gallops Lungs: CTA bilaterally Abdomen: Soft, non-distended, +bs Extremities: no edema b/l lower extremities Dialysis Access:  AVF +bruit  Additional Objective Labs: Basic Metabolic Panel: Recent Labs  Lab 10/26/22 1028 10/26/22 1742 10/27/22 0434 10/27/22 0811 10/27/22 2024 10/28/22 0029 10/28/22 0631  NA 136  --  136  --   --   --  135  K 4.7  --  7.1*  --   --   --  4.4  CL 99  --  93*  --   --   --  91*  CO2  --   --  24  --   --   --  28  GLUCOSE 117*  --  183*  --   --   --  96  BUN 51*  --  75*  --   --   --  35*  CREATININE 10.90*  --  10.52*  --   --   --  6.77*  CALCIUM  --    < > 6.9*   < > 6.8* 6.6* 6.4*  PHOS  --   --  6.1*  --   --   --  4.2   < > = values in this interval not displayed.   Liver Function Tests: Recent Labs  Lab 10/26/22 1742 10/27/22 0434 10/28/22 0631  ALBUMIN 3.6 3.2* 3.0*   No results for input(s): "LIPASE", "AMYLASE" in the last 168 hours. CBC: Recent Labs  Lab 10/26/22 1028 10/27/22 0700 10/27/22 0810  WBC  --  9.1 9.8  HGB 12.6* 10.7* 10.6*  HCT 37.0* 32.3* 32.4*  MCV  --  93.4 93.6  PLT  --  191 191   Blood Culture    Component Value Date/Time   SDES BLOOD RIGHT FOREARM 03/29/2020 2250   SPECREQUEST  03/29/2020 2250    BOTTLES DRAWN AEROBIC AND ANAEROBIC Blood Culture adequate volume   CULT  03/29/2020  2250    NO GROWTH 5 DAYS Performed at [facility], [place] [address], [place], [place] [address]    REPTSTATUS 04/03/2020 FINAL 03/29/2020 2250    Cardiac Enzymes: No results for input(s): "CKTOTAL", "CKMB", "CKMBINDEX", "TROPONINI" in the last 168 hours. CBG: Recent Labs  Lab 10/26/22 2023 10/27/22 1325 10/27/22 1643 10/27/22 2038 10/28/22 0813  GLUCAP 247* 64* 139* 85 107*   Iron Studies: No results for input(s): "IRON", "TIBC", "TRANSFERRIN", "FERRITIN" in the last 72 hours. @lablastinr3 @ Studies/Results: No results found. Medications:  sodium chloride 10 mL/hr at 10/26/22 1723   calcium gluconate      amLODipine  10 mg Oral QHS   calcitRIOL  1 mcg Oral BID   calcium carbonate  3 tablet Oral TID   carvedilol  12.5 mg Oral BID WC   Chlorhexidine Gluconate  Cloth  6 each Topical Q0600   glipiZIDE  10 mg Oral QAC breakfast   insulin aspart  0-15 Units Subcutaneous TID WC   lanthanum  2,000 mg Oral TID WC    Dialysis Orders: Center: [facility]  on TTS. 180NRe 3hr 97min BFR 400 DFR 800 EDW 62kg 2K 2Ca AVF 15g Heparin 2000 unit bolus   Sensipar 180mg  daily at home Forsenol 1000mg  2 tabs TID [facility]    Assessment/Plan:  S/p parathyroidectomy: S/p parathyroidectomy and transplant 10/26/22. Will monitor calcium levels closely, q4 hours overnight. Continue PO calcitriol and tums. Calcium gluconate 2g ordered today.   ESRD:  No issues with HD reported. Continue TTS schedule  Hypertension/volume: Continue amlodipine and carvedilol. UF to EDW tomorrow as tolerated  Anemia: Hgb at goal, no indication for ESA. Will continue to monitor.   Metabolic bone disease: S/p parathyroidectomy, see above. Will continue fosrenol as phosphorus binder.   T2DM: management per primary team  Anice Paganini, PA-C 10/28/2022, 8:42 AM  [facility] Pager: [phone]

## 2022-10-29 LAB — CBC
HCT: 31.6 % — ABNORMAL LOW (ref 39.0–52.0)
Hemoglobin: 10.1 g/dL — ABNORMAL LOW (ref 13.0–17.0)
MCH: 30.1 pg (ref 26.0–34.0)
MCHC: 32 g/dL (ref 30.0–36.0)
MCV: 94 fL (ref 80.0–100.0)
Platelets: 201 10*3/uL (ref 150–400)
RBC: 3.36 MIL/uL — ABNORMAL LOW (ref 4.22–5.81)
RDW: 14.8 % (ref 11.5–15.5)
WBC: 8.5 10*3/uL (ref 4.0–10.5)
nRBC: 0 % (ref 0.0–0.2)

## 2022-10-29 LAB — RENAL FUNCTION PANEL
Albumin: 2.9 g/dL — ABNORMAL LOW (ref 3.5–5.0)
Anion gap: 15 (ref 5–15)
BUN: 51 mg/dL — ABNORMAL HIGH (ref 6–20)
CO2: 24 mmol/L (ref 22–32)
Calcium: 6.2 mg/dL — CL (ref 8.9–10.3)
Chloride: 93 mmol/L — ABNORMAL LOW (ref 98–111)
Creatinine, Ser: 9.02 mg/dL — ABNORMAL HIGH (ref 0.61–1.24)
GFR, Estimated: 6 mL/min — ABNORMAL LOW (ref >60)
Glucose, Bld: 109 mg/dL — ABNORMAL HIGH (ref 70–99)
Phosphorus: 3.6 mg/dL (ref 2.5–4.6)
Potassium: 5.4 mmol/L — ABNORMAL HIGH (ref 3.5–5.1)
Sodium: 132 mmol/L — ABNORMAL LOW (ref 135–145)

## 2022-10-29 LAB — CALCIUM
Calcium: 6.9 mg/dL — ABNORMAL LOW (ref 8.9–10.3)
Calcium: 7.5 mg/dL — ABNORMAL LOW (ref 8.9–10.3)
Calcium: 6.9 mg/dL — ABNORMAL LOW (ref 8.9–10.3)
Calcium: 6.8 mg/dL — ABNORMAL LOW (ref 8.9–10.3)

## 2022-10-29 LAB — GLUCOSE, CAPILLARY
Glucose-Capillary: 60 mg/dL — ABNORMAL LOW (ref 70–99)
Glucose-Capillary: 185 mg/dL — ABNORMAL HIGH (ref 70–99)
Glucose-Capillary: 83 mg/dL (ref 70–99)

## 2022-10-29 LAB — TROPONIN I (HIGH SENSITIVITY): Troponin I (High Sensitivity): 20 ng/L — ABNORMAL HIGH (ref <18)

## 2022-10-29 MED ORDER — CALCIUM CARBONATE ANTACID 500 MG PO CHEW
1000.0000 mg | CHEWABLE_TABLET | Freq: Three times a day (TID) | ORAL | Status: DC
  Administered 2022-10-29 (×2): 1000 mg via ORAL
  Filled 2022-10-29 (×2): qty 5

## 2022-10-29 MED ORDER — LANTHANUM CARBONATE 500 MG PO CHEW
1000.0000 mg | CHEWABLE_TABLET | Freq: Three times a day (TID) | ORAL | Status: DC
  Administered 2022-10-29 – 2022-10-30 (×4): 1000 mg via ORAL
  Filled 2022-10-29 (×6): qty 2

## 2022-10-29 MED ORDER — CALCITRIOL 0.5 MCG PO CAPS
2.0000 ug | ORAL_CAPSULE | Freq: Two times a day (BID) | ORAL | Status: DC
  Administered 2022-10-29: 2 ug via ORAL
  Filled 2022-10-29: qty 4

## 2022-10-29 MED ORDER — CALCIUM CARBONATE ANTACID 500 MG PO CHEW: 800.0000 mg | CHEWABLE_TABLET | Freq: Three times a day (TID) | ORAL | Status: DC

## 2022-10-29 MED ORDER — CALCIUM GLUCONATE-NACL 2-0.675 GM/100ML-% IV SOLN
2.0000 g | Freq: Once | INTRAVENOUS | Status: AC
  Administered 2022-10-29: 2000 mg via INTRAVENOUS
  Filled 2022-10-29 (×2): qty 100

## 2022-10-29 NOTE — Progress Notes (Signed)
I was paged at 2002 this evening that patient had an episode of chest cramping after returning from dialysis. EKG completed earlier this evening was reviewed (visible under media tab) and I do not appreciate ST elevation. Will check troponin and continue to monitor.

## 2022-10-29 NOTE — Progress Notes (Signed)
[facility] Progress Note   Subjective:   Pt reported lip pain and swelling yesterday. Today, he reports lips hurt inside, does appear a bit red but not significantly swollen. No numbness, tingling, or trouble breathing. Ca still low, ordered calcium gluconate with HD today and increasing oral calcium dose.   Objective Vitals:   10/29/22 0820 10/29/22 0830 10/29/22 0905 10/29/22 0929  BP: (!) 150/71 (!) 141/62 134/60 134/60  Pulse: 65 65 65 65  Resp: 16 20 15 16   Temp: 98.1 F (36.7 C)     TempSrc: Oral     SpO2: 91% 93% 95% 94%  Weight:      Height:       Physical Exam General: Alert male in NAD Heart: RRR, no murmurs, rubs or gallops Lungs: CTA bilaterally Abdomen: Soft, non-distended, +bs Extremities: no edema b/l lower extremities Dialysis Access:  AVF +bruit  Additional Objective Labs: Basic Metabolic Panel: Recent Labs  Lab 10/27/22 0434 10/27/22 0811 10/28/22 0631 10/28/22 1214 10/28/22 2008 10/29/22 0021 10/29/22 0420  NA 136  --  135  --   --   --  132*  K 7.1*  --  4.4  --   --   --  5.4*  CL 93*  --  91*  --   --   --  93*  CO2 24  --  28  --   --   --  24  GLUCOSE 183*  --  96  --   --   --  109*  BUN 75*  --  35*  --   --   --  51*  CREATININE 10.52*  --  6.77*  --   --   --  9.02*  CALCIUM 6.9*   < > 6.4*   < > 6.3* 6.9* 6.2*  PHOS 6.1*  --  4.2  --   --   --  3.6   < > = values in this interval not displayed.   Liver Function Tests: Recent Labs  Lab 10/27/22 0434 10/28/22 0631 10/29/22 0420  ALBUMIN 3.2* 3.0* 2.9*   No results for input(s): "LIPASE", "AMYLASE" in the last 168 hours. CBC: Recent Labs  Lab 10/27/22 0700 10/27/22 0810 10/29/22 0830  WBC 9.1 9.8 8.5  HGB 10.7* 10.6* 10.1*  HCT 32.3* 32.4* 31.6*  MCV 93.4 93.6 94.0  PLT 191 191 201   Blood Culture    Component Value Date/Time   SDES BLOOD RIGHT FOREARM 03/29/2020 2250   SPECREQUEST  03/29/2020 2250    BOTTLES DRAWN AEROBIC AND ANAEROBIC Blood  Culture adequate volume   CULT  03/29/2020 2250    NO GROWTH 5 DAYS Performed at [facility], [place] [address] [place], [place] [address]    REPTSTATUS 04/03/2020 FINAL 03/29/2020 2250    Cardiac Enzymes: No results for input(s): "CKTOTAL", "CKMB", "CKMBINDEX", "TROPONINI" in the last 168 hours. CBG: Recent Labs  Lab 10/27/22 2038 10/28/22 0813 10/28/22 1143 10/28/22 1709 10/28/22 2147  GLUCAP 85 107* 126* 168* 80   Iron Studies: No results for input(s): "IRON", "TIBC", "TRANSFERRIN", "FERRITIN" in the last 72 hours. @lablastinr3 @ Studies/Results: No results found. Medications:  sodium chloride 10 mL/hr at 10/26/22 1723   calcium gluconate      amLODipine  10 mg Oral QHS   calcitRIOL  2 mcg Oral BID   calcium carbonate  1,000 mg of elemental calcium Oral TID   carvedilol  12.5 mg Oral BID WC   Chlorhexidine Gluconate Cloth  6 each Topical Q0600   Chlorhexidine Gluconate Cloth  6 each Topical Q0600   glipiZIDE  10 mg Oral QAC breakfast   insulin aspart  0-15 Units Subcutaneous TID WC   lanthanum  1,000 mg Oral TID WC    Dialysis Orders: Center: [facility]  on TTS. 180NRe 3hr 5min BFR 400 DFR 800 EDW 62kg 2K 2Ca AVF 15g Heparin 2000 unit bolus   Sensipar 180mg  daily at home Forsenol 1000mg  2 tabs TID AC  Assessment/Plan:  S/p parathyroidectomy: S/p parathyroidectomy and transplant 10/26/22. Will monitor calcium levels closely, q4 hours overnight. Continue PO calcitriol and tums- increased to 1g PO TID. Calcium gluconate 2g ordered today.   ESRD:  No issues with HD reported. Continue TTS schedule  Hypertension/volume: Continue amlodipine and carvedilol. UF to EDW tomorrow as tolerated  Anemia: Hgb at goal, no indication for ESA. Will continue to monitor.   Metabolic bone disease: S/p parathyroidectomy, see above. Will continue fosrenol as phosphorus binder.   T2DM: management per primary team Lip pain: does not appear consistent with angioedema, no respiratory  compromise. Can likely f/u with PCP outpatient    Anice Paganini, PA-C 10/29/2022, 9:43 AM  [facility] Pager: [phone]

## 2022-10-29 NOTE — TOC CM/SW Note (Addendum)
Patient has PCP .   Pharmacy changed to [facility].  Referral to Financial  counselor

## 2022-10-29 NOTE — Significant Event (Addendum)
Rapid Response Event Note   Reason for Call :  Complaint of chest discomfort   Initial Focused Assessment:  Pt lying in bed, AO. Jaundice sclera. Skin is warm, pink, dry. Peripheral pulses 2+. Lung sounds are clear. Heart sounds are regular, no adventitious heart sounds. Abdomen is soft, round. No pain to abdominal palpation. Pt last BM yesterday.   Pt in no distress. He is complaining of intermittent cramping sensation to bilateral lateral ribs 6-8.   Pain not worsened or brought on by palpation of area. Pain 5/10 when present.  VS: T 64F, BP 135/64, HR 79, RR 16, SpO2 99% on room air  Interventions:  -EKG  Plan of Care:  -Continue to monitor- pt with hypocalcemia receiving calcium replacement -PRN pain medication available -Consider muscle relaxant -Consider hot/cold therapy   Call rapid response for additional needs  Event Summary:  MD Notified: per RN, pending call back Call Time: 1703 Arrival Time: 1713 End Time: [place], RN

## 2022-10-29 NOTE — Progress Notes (Signed)
Assessment & Plan: POD#3 - status post total parathyroidectomy with autotransplant to right forearm             - stable post op without obvious complications             - pain controlled             - in HD currently this morning             - calcium down to 6.2 mg/dl this AM             - OK to use right upper extremity for IV access   Home when stable from nephrology standpoint.  Will follow.        [name], MD [facility] [place] practice Office: [phone]        Chief Complaint: Secondary HPTH  Subjective: Patient in HD, complains of upper lip swelling.  Denies pain at incisions.  Objective: Vital signs in last 24 hours: Temp:  [97.8 F (36.6 C)-98.3 F (36.8 C)] 98.1 F (36.7 C) (03/28 0820) Pulse Rate:  [65-74] 66 (03/28 1006) Resp:  [15-20] 20 (03/28 1006) BP: (114-150)/(30-73) 132/66 (03/28 1006) SpO2:  [91 %-100 %] 93 % (03/28 1006) Weight:  [64.3 kg] 64.3 kg (03/28 0805) Last BM Date : 10/28/22  Intake/Output from previous day: 03/27 0701 - 03/28 0700 In: 976.2 [P.O.:240; I.V.:596.2; IV Piggyback:140] Out: -  Intake/Output this shift: No intake/output data recorded.  Physical Exam: HEENT - sclerae clear, mucous membranes moist; mild STS upper lip, no obvious Candida Neck - wound dry and intact; Dermabond in place Ext - right forearm wound dry and intact with Dermabond Neuro - alert & oriented, no focal deficits  Lab Results:  Recent Labs    10/27/22 0810 10/29/22 0830  WBC 9.8 8.5  HGB 10.6* 10.1*  HCT 32.4* 31.6*  PLT 191 201   BMET Recent Labs    10/28/22 0631 10/28/22 1214 10/29/22 0021 10/29/22 0420  NA 135  --   --  132*  K 4.4  --   --  5.4*  CL 91*  --   --  93*  CO2 28  --   --  24  GLUCOSE 96  --   --  109*  BUN 35*  --   --  51*  CREATININE 6.77*  --   --  9.02*  CALCIUM 6.4*   < > 6.9* 6.2*   < > = values in this interval not displayed.   PT/INR No results for input(s): "LABPROT", "INR"  in the last 72 hours. Comprehensive Metabolic Panel:    Component Value Date/Time   NA 132 (L) 10/29/2022 0420   NA 135 10/28/2022 0631   NA 142 01/15/2021 1223   NA 138 11/15/2019 1014   K 5.4 (H) 10/29/2022 0420   K 4.4 10/28/2022 0631   CL 93 (L) 10/29/2022 0420   CL 91 (L) 10/28/2022 0631   CO2 24 10/29/2022 0420   CO2 28 10/28/2022 0631   BUN 51 (H) 10/29/2022 0420   BUN 35 (H) 10/28/2022 0631   BUN 44 (H) 01/15/2021 1223   BUN 34 (H) 11/15/2019 1014   CREATININE 9.02 (H) 10/29/2022 0420   CREATININE 6.77 (H) 10/28/2022 0631   GLUCOSE 109 (H) 10/29/2022 0420   GLUCOSE 96 10/28/2022 0631   CALCIUM 6.2 (LL) 10/29/2022 0420   CALCIUM 6.9 (L) 10/29/2022 0021   AST 7 06/03/2022 1034   AST 19  11/17/2021 1634   ALT 7 06/03/2022 1034   ALT 6 04/13/2022 1028   ALKPHOS 474 (H) 06/03/2022 1034   ALKPHOS 289 (H) 11/17/2021 1634   BILITOT 0.3 06/03/2022 1034   BILITOT 0.7 11/17/2021 1634   BILITOT 0.3 01/15/2021 1223   PROT 7.2 06/03/2022 1034   PROT 7.3 11/17/2021 1634   PROT 7.6 01/15/2021 1223   ALBUMIN 2.9 (L) 10/29/2022 0420   ALBUMIN 3.0 (L) 10/28/2022 0631   ALBUMIN 4.2 06/03/2022 1034   ALBUMIN 4.4 01/15/2021 1223    Studies/Results: No results found.    [name] 10/29/2022  Patient ID: Thornell Mule Acevedo-Ocejo, male   DOB: 1970/11/06, 52 y.o.   MRN: [id number]

## 2022-10-29 NOTE — Progress Notes (Signed)
Pt complaining of tightness in chest with no other complaints. Spoke with SWOT RN and charge nurse, decided to call rapid RN to come and evaluate. Rapid RN came, evaluated, and performed EKG. Rapid RN was not concerned after examination but to continue monitoring. Was unable to reach MD due to not knowing who to contact. Tried paging and got MD that was not covering patient. Will speak with charge RN to see who to call next. See Rapid RN note.

## 2022-10-29 NOTE — Procedures (Signed)
I was present at this dialysis session. I have reviewed the session itself and made appropriate changes.   Filed Weights   10/27/22 0801 10/27/22 1227 10/29/22 0805  Weight: 66.5 kg 64.3 kg 64.3 kg    Recent Labs  Lab 10/29/22 0420  NA 132*  K 5.4*  CL 93*  CO2 24  GLUCOSE 109*  BUN 51*  CREATININE 9.02*  CALCIUM 6.2*  PHOS 3.6    Recent Labs  Lab 10/27/22 0700 10/27/22 0810 10/29/22 0830  WBC 9.1 9.8 8.5  HGB 10.7* 10.6* 10.1*  HCT 32.3* 32.4* 31.6*  MCV 93.4 93.6 94.0  PLT 191 191 201    Scheduled Meds:  amLODipine  10 mg Oral QHS   calcitRIOL  2 mcg Oral BID   calcium carbonate  1,000 mg of elemental calcium Oral TID   carvedilol  12.5 mg Oral BID WC   Chlorhexidine Gluconate Cloth  6 each Topical Q0600   Chlorhexidine Gluconate Cloth  6 each Topical Q0600   glipiZIDE  10 mg Oral QAC breakfast   insulin aspart  0-15 Units Subcutaneous TID WC   lanthanum  1,000 mg Oral TID WC   Continuous Infusions:  sodium chloride 10 mL/hr at 10/26/22 1723   calcium gluconate     PRN Meds:.acetaminophen **OR** acetaminophen, HYDROmorphone (DILAUDID) injection, ondansetron **OR** ondansetron (ZOFRAN) IV, oxyCODONE, traMADol   Santiago Bumpers,  MD 10/29/2022, 9:30 AM

## 2022-10-29 NOTE — Progress Notes (Signed)
Received patient in bed ,awake ,alert and oriented x 4. Consent verified.Vitals were stable.  Access used :Left upper arm AVF that worked well.  Medicine given :  Calcium gluconate IV 2 g.  Duration of treatment : 3.75 hours.  Fluid removed : Achieved fluid goal of 2.5 liters.  Hemo issue/comment:Tolerated treatment well.  Hand off to the patient's nurse.

## 2022-10-30 LAB — RENAL FUNCTION PANEL
Albumin: 3 g/dL — ABNORMAL LOW (ref 3.5–5.0)
Anion gap: 16 — ABNORMAL HIGH (ref 5–15)
BUN: 36 mg/dL — ABNORMAL HIGH (ref 6–20)
CO2: 27 mmol/L (ref 22–32)
Calcium: 7 mg/dL — ABNORMAL LOW (ref 8.9–10.3)
Chloride: 94 mmol/L — ABNORMAL LOW (ref 98–111)
Creatinine, Ser: 6.63 mg/dL — ABNORMAL HIGH (ref 0.61–1.24)
GFR, Estimated: 9 mL/min — ABNORMAL LOW (ref >60)
Glucose, Bld: 64 mg/dL — ABNORMAL LOW (ref 70–99)
Phosphorus: 2.7 mg/dL (ref 2.5–4.6)
Potassium: 4.6 mmol/L (ref 3.5–5.1)
Sodium: 137 mmol/L (ref 135–145)

## 2022-10-30 LAB — GLUCOSE, CAPILLARY
Glucose-Capillary: 106 mg/dL — ABNORMAL HIGH (ref 70–99)
Glucose-Capillary: 149 mg/dL — ABNORMAL HIGH (ref 70–99)
Glucose-Capillary: 176 mg/dL — ABNORMAL HIGH (ref 70–99)
Glucose-Capillary: 76 mg/dL (ref 70–99)

## 2022-10-30 LAB — CALCIUM, IONIZED: Calcium, Ionized, Serum: 3.5 mg/dL — ABNORMAL LOW (ref 4.5–5.6)

## 2022-10-30 LAB — CALCIUM
Calcium: 6.4 mg/dL — CL (ref 8.9–10.3)
Calcium: 6.9 mg/dL — ABNORMAL LOW (ref 8.9–10.3)
Calcium: 6.9 mg/dL — ABNORMAL LOW (ref 8.9–10.3)

## 2022-10-30 LAB — TROPONIN I (HIGH SENSITIVITY): Troponin I (High Sensitivity): 19 ng/L — ABNORMAL HIGH (ref <18)

## 2022-10-30 MED ORDER — CALCIUM GLUCONATE-NACL 2-0.675 GM/100ML-% IV SOLN
2.0000 g | Freq: Once | INTRAVENOUS | Status: AC
  Administered 2022-10-30: 2000 mg via INTRAVENOUS
  Filled 2022-10-30: qty 100

## 2022-10-30 MED ORDER — CALCIUM CARBONATE ANTACID 500 MG PO CHEW
1000.0000 mg | CHEWABLE_TABLET | Freq: Four times a day (QID) | ORAL | Status: DC
  Administered 2022-10-30 – 2022-10-31 (×6): 1000 mg via ORAL
  Filled 2022-10-30 (×6): qty 5

## 2022-10-30 MED ORDER — CALCITRIOL 0.5 MCG PO CAPS
3.0000 ug | ORAL_CAPSULE | Freq: Two times a day (BID) | ORAL | Status: DC
  Administered 2022-10-30 – 2022-10-31 (×3): 3 ug via ORAL
  Filled 2022-10-30 (×3): qty 6

## 2022-10-30 NOTE — Progress Notes (Signed)
Assessment & Plan: POD#4 - status post total parathyroidectomy with autotransplant to right forearm 3/25             - stable post op              - pain controlled -Main rate limiting step is his postoperative hypocalcemia.  Continue aggressive oral supplementation.  Add additional IV supplementation.  Recommendations from nephrology will continue to be helpful.             -OK to use right upper extremity for IV access   Hemodialysis q. Tuesday/Thursday/Saturday.  Home when stable from nephrology standpoint.    Surgery to continue to follow.  Discussion plans recommendations made with the help of Spanish interpreter using visual system.  I updated the patient's status to the patient and spouse  Recommendations were made.  Questions were answered.  They expressed understanding & appreciation.         Michael Boston, MD [facility] [place] practice Office: [phone]          Chief Complaint: Secondary HPTH  Subjective:  Hypocalcemia yesterday.   Hemodialysis yesterday.  -2.5L.  Given IV calcium hemodialysis.   Called with calcium low again.  Given extra IV calcium last night..  Continuing oral Tums Complaint of chest pain without EKG changes.  Troponin borderline.  Patient notes its mainly in his right lateral chest wall that happens from time to time. Also had some complaining of left lower extremity numbness and tingling.  Able to walk to bathroom without any weakness though.  Not walking in hallways.  Objective: Vital signs in last 24 hours: Temp:  [98 F (36.7 C)-98.6 F (37 C)] 98 F (36.7 C) (03/29 0859) Pulse Rate:  [65-79] 73 (03/29 0859) Resp:  [14-20] 17 (03/29 0859) BP: (94-140)/(37-73) 134/41 (03/29 0859) SpO2:  [93 %-99 %] 96 % (03/29 0859) Weight:  [61.9 kg] 61.9 kg (03/28 1252) Last BM Date : 10/28/22  Intake/Output from previous day: 03/28 0701 - 03/29 0700 In: 412.3 [P.O.:340; IV Piggyback:72.3] Out: 2500  Intake/Output this  shift: No intake/output data recorded.  Physical Exam: HEENT - sclerae clear, mucous membranes moist; mild STS upper lip, no obvious Candida Neck - wound dry and intact; Dermabond in place.  No hoarseness or stridor.  Normal speech. Ext - right forearm wound dry and intact with Dermabond Neuro - alert & oriented, no focal deficits  Lab Results:  Recent Labs    10/29/22 0830  WBC 8.5  HGB 10.1*  HCT 31.6*  PLT 201   BMET Recent Labs    10/29/22 0420 10/29/22 1338 10/30/22 0053 10/30/22 0404  NA 132*  --   --  137  K 5.4*  --   --  4.6  CL 93*  --   --  94*  CO2 24  --   --  27  GLUCOSE 109*  --   --  64*  BUN 51*  --   --  36*  CREATININE 9.02*  --   --  6.63*  CALCIUM 6.2*   < > 6.4* 7.0*   < > = values in this interval not displayed.   PT/INR No results for input(s): "LABPROT", "INR" in the last 72 hours. Comprehensive Metabolic Panel:    Component Value Date/Time   NA 137 10/30/2022 0404   NA 132 (L) 10/29/2022 0420   NA 142 01/15/2021 1223   NA 138 11/15/2019 1014   K 4.6 10/30/2022 0404  K 5.4 (H) 10/29/2022 0420   CL 94 (L) 10/30/2022 0404   CL 93 (L) 10/29/2022 0420   CO2 27 10/30/2022 0404   CO2 24 10/29/2022 0420   BUN 36 (H) 10/30/2022 0404   BUN 51 (H) 10/29/2022 0420   BUN 44 (H) 01/15/2021 1223   BUN 34 (H) 11/15/2019 1014   CREATININE 6.63 (H) 10/30/2022 0404   CREATININE 9.02 (H) 10/29/2022 0420   GLUCOSE 64 (L) 10/30/2022 0404   GLUCOSE 109 (H) 10/29/2022 0420   CALCIUM 7.0 (L) 10/30/2022 0404   CALCIUM 6.4 (LL) 10/30/2022 0053   AST 7 06/03/2022 1034   AST 19 11/17/2021 1634   ALT 7 06/03/2022 1034   ALT 6 04/13/2022 1028   ALKPHOS 474 (H) 06/03/2022 1034   ALKPHOS 289 (H) 11/17/2021 1634   BILITOT 0.3 06/03/2022 1034   BILITOT 0.7 11/17/2021 1634   BILITOT 0.3 01/15/2021 1223   PROT 7.2 06/03/2022 1034   PROT 7.3 11/17/2021 1634   PROT 7.6 01/15/2021 1223   ALBUMIN 3.0 (L) 10/30/2022 0404   ALBUMIN 2.9 (L) 10/29/2022 0420    ALBUMIN 4.2 06/03/2022 1034   ALBUMIN 4.4 01/15/2021 1223    Studies/Results: No results found.    Adin Hector 10/30/2022  Patient ID: Blake Santana, male   DOB: October 28, 1970, 52 y.o.   MRN: [id number]

## 2022-10-30 NOTE — Progress Notes (Signed)
Inpatient Diabetes Program Recommendations  AACE/ADA: New Consensus Statement on Inpatient Glycemic Control (2015)  Target Ranges:  Prepandial:   less than 140 mg/dL      Peak postprandial:   less than 180 mg/dL (1-2 hours)      Critically ill patients:  140 - 180 mg/dL   Lab Results  Component Value Date   GLUCAP 106 (H) 10/30/2022   HGBA1C 6.6 (H) 10/26/2022    Review of Glycemic Control  Latest Reference Range & Units 10/29/22 13:37 10/29/22 15:11 10/29/22 21:00 10/30/22 09:00  Glucose-Capillary 70 - 99 mg/dL 60 (L) 185 (H) 83 106 (H)   Diabetes history: DM  Outpatient Diabetes medications: Glucotrol 10 mg daily Current orders for Inpatient glycemic control:  Novolog 0-15 units tid with meals Glipizide 10 mg daily Inpatient Diabetes Program Recommendations:   Note hx. of ESRD- consider d/c of Glucotrol and also decrease correction to sensitive tid with meals (0-9 units).   Thanks,  Adah Perl, RN, BC-ADM Inpatient Diabetes Coordinator Pager [phone]  (8a-5p)

## 2022-10-30 NOTE — Progress Notes (Addendum)
[facility] Progress Note   Subjective:  Seen in room - feeling ok today. CP last night resulting in RR being called - has resolved now. No dyspnea. Ca remains low - getting another 2g IV CaGlu now.  Objective Vitals:   10/29/22 1700 10/29/22 2057 10/30/22 0312 10/30/22 0859  BP: 135/64 (!) 128/50 137/73 (!) 134/41  Pulse: 79 72 69 73  Resp:   18 17  Temp: 98 F (36.7 C) 98.5 F (36.9 C) 98.6 F (37 C) 98 F (36.7 C)  TempSrc: Oral Oral Oral Oral  SpO2: 99% 95% 96% 96%  Weight:      Height:       Physical Exam General: Well appearing man, NAD. Room air Heart: RRR; no murmur Lungs: CTA anteriorly Abdomen: soft Extremities: no LE edema Dialysis Access: AVF + thrill  Additional Objective Labs: Basic Metabolic Panel: Recent Labs  Lab 10/28/22 0631 10/28/22 1214 10/29/22 0420 10/29/22 1338 10/30/22 0053 10/30/22 0404 10/30/22 0844  NA 135  --  132*  --   --  137  --   K 4.4  --  5.4*  --   --  4.6  --   CL 91*  --  93*  --   --  94*  --   CO2 28  --  24  --   --  27  --   GLUCOSE 96  --  109*  --   --  64*  --   BUN 35*  --  51*  --   --  36*  --   CREATININE 6.77*  --  9.02*  --   --  6.63*  --   CALCIUM 6.4*   < > 6.2*   < > 6.4* 7.0* 6.9*  PHOS 4.2  --  3.6  --   --  2.7  --    < > = values in this interval not displayed.   Liver Function Tests: Recent Labs  Lab 10/28/22 0631 10/29/22 0420 10/30/22 0404  ALBUMIN 3.0* 2.9* 3.0*   CBC: Recent Labs  Lab 10/27/22 0700 10/27/22 0810 10/29/22 0830  WBC 9.1 9.8 8.5  HGB 10.7* 10.6* 10.1*  HCT 32.3* 32.4* 31.6*  MCV 93.4 93.6 94.0  PLT 191 191 201   Medications:  sodium chloride 10 mL/hr at 10/30/22 1115    amLODipine  10 mg Oral QHS   calcitRIOL  3 mcg Oral BID   calcium carbonate  1,000 mg of elemental calcium Oral QID   carvedilol  12.5 mg Oral BID WC   Chlorhexidine Gluconate Cloth  6 each Topical Q0600   Chlorhexidine Gluconate Cloth  6 each Topical Q0600   glipiZIDE  10 mg  Oral QAC breakfast   insulin aspart  0-15 Units Subcutaneous TID WC   lanthanum  1,000 mg Oral TID WC    Dialysis Orders: [place]  on TTS 180NRe 3hr 81min BFR 400 DFR 800 EDW 62kg 2K 2Ca AVF 15g Heparin 2000 unit bolus - Sensipar 180mg  daily at home -> DISCONTINUED - Fosrenol 1000mg  2 tabs TID AC as binder -> now DISCONTINUED  Assessment/Plan: Severe hyperparathyroidism s/p parathyroidectomy and auto-transplant 0000000: Complicated by hypocalcemia/hungry bone syndrome. Currently on PO calcitriol 82mcg BID (started 3/29 up from 2mg ) and Tums 1000mg  QID (started 3/29, up from TID). S/p multiple doses of IV CaGlu - getting another 2g today. Will d/c lanthanum at this time. He is asymptomatic, but would not discharge until has been > 24hr without IV Ca. ESRD:  Continue HD on TTS schedule - next tomorrow.  Chest pain s/p HD 3/28 - better now, normal EKG, follow. Hypertension/volume: BP controlled, euvolemic on exam. Continue home meds. Anemia: Hgb 10.1 - monitor without ESA for now. Lip pain: Seems to have resolved - follow. T2DM: On glipizide + insulin SS.  Veneta Penton, PA-C 10/30/2022, 11:53 AM  [employer]

## 2022-10-31 DIAGNOSIS — Z789 Other specified health status: Secondary | ICD-10-CM | POA: Insufficient documentation

## 2022-10-31 LAB — CBC
HCT: 34.4 % — ABNORMAL LOW (ref 39.0–52.0)
Hemoglobin: 11.6 g/dL — ABNORMAL LOW (ref 13.0–17.0)
MCH: 30.9 pg (ref 26.0–34.0)
MCHC: 33.7 g/dL (ref 30.0–36.0)
MCV: 91.5 fL (ref 80.0–100.0)
Platelets: 227 10*3/uL (ref 150–400)
RBC: 3.76 MIL/uL — ABNORMAL LOW (ref 4.22–5.81)
RDW: 14.7 % (ref 11.5–15.5)
WBC: 7.3 10*3/uL (ref 4.0–10.5)
nRBC: 0 % (ref 0.0–0.2)

## 2022-10-31 LAB — RENAL FUNCTION PANEL
Albumin: 3.1 g/dL — ABNORMAL LOW (ref 3.5–5.0)
Anion gap: 16 — ABNORMAL HIGH (ref 5–15)
BUN: 63 mg/dL — ABNORMAL HIGH (ref 6–20)
CO2: 26 mmol/L (ref 22–32)
Calcium: 7.2 mg/dL — ABNORMAL LOW (ref 8.9–10.3)
Chloride: 96 mmol/L — ABNORMAL LOW (ref 98–111)
Creatinine, Ser: 8.77 mg/dL — ABNORMAL HIGH (ref 0.61–1.24)
GFR, Estimated: 7 mL/min — ABNORMAL LOW (ref >60)
Glucose, Bld: 81 mg/dL (ref 70–99)
Phosphorus: 2.8 mg/dL (ref 2.5–4.6)
Potassium: 5.5 mmol/L — ABNORMAL HIGH (ref 3.5–5.1)
Sodium: 138 mmol/L (ref 135–145)

## 2022-10-31 LAB — GLUCOSE, CAPILLARY: Glucose-Capillary: 68 mg/dL — ABNORMAL LOW (ref 70–99)

## 2022-10-31 LAB — CALCIUM
Calcium: 7.2 mg/dL — ABNORMAL LOW (ref 8.9–10.3)
Calcium: 7.6 mg/dL — ABNORMAL LOW (ref 8.9–10.3)

## 2022-10-31 MED ORDER — CALCIUM CARBONATE ANTACID 500 MG PO CHEW: 1000.0000 mg | CHEWABLE_TABLET | Freq: Four times a day (QID) | ORAL | 2 refills | Status: AC

## 2022-10-31 MED ORDER — TRAMADOL HCL 50 MG PO TABS: 50.0000 mg | ORAL_TABLET | Freq: Four times a day (QID) | ORAL | 0 refills | Status: DC | PRN

## 2022-10-31 MED ORDER — CALCITRIOL 0.5 MCG PO CAPS: 3.0000 ug | ORAL_CAPSULE | Freq: Two times a day (BID) | ORAL | 4 refills | Status: DC

## 2022-10-31 MED ORDER — HEPARIN SODIUM (PORCINE) 1000 UNIT/ML DIALYSIS
20.0000 [IU]/kg | INTRAMUSCULAR | Status: DC | PRN
  Administered 2022-10-31: 1200 [IU] via INTRAVENOUS_CENTRAL

## 2022-10-31 NOTE — Discharge Summary (Signed)
Physician Discharge Summary    Patient ID: Blake Santana MRN: [id number] DOB/AGE: 1970-12-22  52 y.o.  Patient Care Team: [name], NP as PCP - General (Nurse Practitioner) Sueanne Margarita, MD as PCP - Cardiology (Cardiology) Desma Maxim, MD as Referring Physician (Ophthalmology) Madelon Lips, MD as Consulting Physician (Nephrology) [name], MD as Consulting Physician (General Surgery)  Admit date: 10/26/2022  Discharge date: 10/31/2022  Hospital Stay = 5 days    Discharge Diagnoses:  Principal Problem:   Secondary hyperparathyroidism of renal origin [facility]) Active Problems:   ESRD on hemodialysis [facility])   Diabetic retinopathy ([place])   Hypertension associated with diabetes ([place])   Non-English speaking patient   5 Days Post-Op  10/26/2022  POST-OPERATIVE DIAGNOSIS:   SECONDARY HYPERPARATHYROIDISM OF RENAL ORIGIN  SURGERY:  10/26/2022  TOTAL PARATHYROIDECTOMY AUTOTRANSPLANT TO RIGHT FOREARM  SURGEON:    Surgeon(s): [name], MD  Consults: Anesthesia & Nephrology  Hospital Course:   The patient underwent the surgery above.  Postoperatively, the patient gradually mobilized and advanced to a solid diet.  Pain and other symptoms were treated aggressively.  Patient had hypocalcemia that was treated with oral and IV interventions.  Followed closely by nephrology with usual dialysis on Thursday.  By the time of discharge, the patient was walking well the hallways, eating food, having flatus.  Pain was well-controlled on an oral medications.  Calcium has stabilized.  Discussed with nephrology.  Recommendation made to allow the patient to go home after dialysis Saturday.  Based on meeting discharge criteria and continuing to recover, I felt it was safe for the patient to be discharged from the hospital to further recover with close followup. Postoperative recommendations were discussed in detail by myself along with the nephrology team.   Patient to switch to Tums and calcitriol medications only for calcium issues.  Instructions written in [place] and [place].  Questions answered.  Patient agreed with plan.  Discharged Condition: stable  Discharge Exam: Blood pressure (!) 151/68, pulse 67, temperature 98 F (36.7 C), temperature source Oral, resp. rate 17, height 5\' 2"  (1.575 m), weight 61.9 kg, SpO2 96 %.  General: Pt awake/alert/oriented x4 in No acute distress Eyes: PERRL, normal EOM.  Sclera clear.  No icterus Neuro: CN II-XII intact w/o focal sensory/motor deficits. Lymph: No head/neck/groin lymphadenopathy Psych:  No delerium/psychosis/paranoia HENT: Normocephalic, Mucus membranes moist.  No thrush.  Collar neck incision clean without hematoma or swelling.  Normal speech.  No hoarseness or stridor. Neck: Supple, No tracheal deviation Chest:  No chest wall pain w good excursion CV:  Pulses intact.  Regular rhythm MS: Normal AROM mjr joints.  No obvious deformity.  Right forearm incision with Dermabond in place without hematoma drainage or cellulitis.  AV fistula with palpable thrill. Abdomen: Soft.  Nondistended.  Nontender.  No evidence of peritonitis.  No incarcerated hernias. Ext:  SCDs BLE.  No mjr edema.  No cyanosis Skin: No petechiae / purpura   Disposition:    Follow-up Information     [name], MD Follow up on 11/18/2022.   Specialty: General Surgery Why: To have your incisions re-checked Contact information: [address] [address] [phone]         Madelon Lips, MD. Schedule an appointment as soon as possible for a visit in 2 week(s).   Specialty: Nephrology Why: to check you calcium levels & medication needs Contact information: [address] [place] [address] [phone]  Discharge disposition: 01-Home or Self Care       Discharge Instructions     Call MD for:   Complete by: As directed    FEVER > 101.5 F   (temperatures < 101.5 F are not significant)   Call MD for:  extreme fatigue   Complete by: As directed    Call MD for:  persistant dizziness or light-headedness   Complete by: As directed    Call MD for:  persistant nausea and vomiting   Complete by: As directed    Call MD for:  redness, tenderness, or signs of infection (pain, swelling, redness, odor or green/yellow discharge around incision site)   Complete by: As directed    Call MD for:  severe uncontrolled pain   Complete by: As directed    Diet - low sodium heart healthy   Complete by: As directed    Start with a bland diet such as soups, liquids, starchy foods, low fat foods, etc. the first few days at home. Gradually advance to a solid, low-fat, high fiber diet by the end of the first week at home.   Add a fiber supplement to your diet (Metamucil, etc) If you feel full, bloated, or constipated, stay on a full liquid or pureed/blenderized diet for a few days until you feel better and are no longer constipated.   Discharge instructions   Complete by: As directed    See Discharge Instructions If you are not getting better after two weeks or are noticing you are getting worse, contact our office [phone] for further advice.  We may need to adjust your medications, re-evaluate you in the office, send you to the emergency room, or see what other things we can do to help. The clinic staff is available to answer your questions during regular business hours (8:30am-5pm).  Please don't hesitate to call and ask to speak to one of our nurses for clinical concerns.    A surgeon from [facility] is always on call at the hospitals 24 hours/day If you have a medical emergency, go to the nearest emergency room or call 911.   Discharge wound care:   Complete by: As directed    It is good for closed incisions and even open wounds to be washed every day.  Shower every day.  Short baths are fine.  Wash the incisions and wounds  clean with soap & water.    You may leave closed incisions open to air if it is dry.   You may cover the incision with clean gauze & replace it after your daily shower for comfort.  DERMABOND:  You have purple skin glue (Dermabond) on your incision(s).  Leave them in place, and they will fall off on their own like a scab in 2-3 weeks.  You may trim any edges that curl up with clean scissors.   Driving Restrictions   Complete by: As directed    You may drive when: - you are no longer taking narcotic prescription pain medication - you can comfortably wear a seatbelt - you can safely make sudden turns/stops without pain.   Increase activity slowly   Complete by: As directed    Start light daily activities --- self-care, walking, climbing stairs- beginning the day after surgery.  Gradually increase activities as tolerated.  Control your pain to be active.  Stop when you are tired.  Ideally, walk several times a day, eventually an hour a day.   Most people are  back to most day-to-day activities in a few weeks.  It takes 4-6 weeks to get back to unrestricted, intense activity. If you can walk 30 minutes without difficulty, it is safe to try more intense activity such as jogging, treadmill, bicycling, low-impact aerobics, swimming, etc. Save the most intensive and strenuous activity for last (Usually 4-8 weeks after surgery) such as sit-ups, heavy lifting, contact sports, etc.  Refrain from any intense heavy lifting or straining until you are off narcotics for pain control.  You will have off days, but things should improve week-by-week. DO NOT PUSH THROUGH PAIN.  Let pain be your guide: If it hurts to do something, don't do it.   Lifting restrictions   Complete by: As directed    If you can walk 30 minutes without difficulty, it is safe to try more intense activity such as jogging, treadmill, bicycling, low-impact aerobics, swimming, etc. Save the most intensive and strenuous activity for last (Usually  4-8 weeks after surgery) such as sit-ups, heavy lifting, contact sports, etc.   Refrain from any intense heavy lifting or straining until you are off narcotics for pain control.  You will have off days, but things should improve week-by-week. DO NOT PUSH THROUGH PAIN.  Let pain be your guide: If it hurts to do something, don't do it.  Pain is your body warning you to avoid that activity for another week until the pain goes down.   May shower / Bathe   Complete by: As directed    May walk up steps   Complete by: As directed    Sexual Activity Restrictions   Complete by: As directed    You may have sexual intercourse when it is comfortable. If it hurts to do something, stop.       Allergies as of 10/31/2022   No Known Allergies      Medication List     STOP taking these medications    lanthanum 1000 MG chewable tablet Commonly known as: FOSRENOL   oxyCODONE-acetaminophen 5-325 MG tablet Commonly known as: Percocet       TAKE these medications    acetaminophen 500 MG tablet Commonly known as: TYLENOL Take 1,000 mg by mouth every 6 (six) hours as needed for moderate pain or headache.   AgaMatrix Presto w/Device Kit Check sugars twice daily   True Metrix Meter w/Device Kit Use como se indica. (Use as directed)   AgaMatrix Ultra-Thin Lancets Misc Check blood glucose twice daily before meals.   TRUEplus Lancets 28G Misc Use como se indica. (Use as directed)   amLODipine 10 MG tablet Commonly known as: NORVASC Take 1 tablet (10 mg total) by mouth daily.   aspirin-acetaminophen-caffeine 250-250-65 MG tablet Commonly known as: EXCEDRIN MIGRAINE Take 1-2 tablets by mouth every 6 (six) hours as needed for headache.   calcitRIOL 0.5 MCG capsule Commonly known as: ROCALTROL Take 6 capsules (3 mcg total) by mouth 2 (two) times daily.   calcium carbonate 500 MG chewable tablet Commonly known as: TUMS - dosed in mg elemental calcium Chew 5 tablets (1,000 mg of  elemental calcium total) by mouth 4 (four) times daily.   carvedilol 12.5 MG tablet Commonly known as: COREG Take 1 tablet (12.5 mg total) by mouth 2 (two) times daily.   ezetimibe 10 MG tablet Commonly known as: ZETIA Take 1 tablet (10 mg total) by mouth daily.   glipiZIDE 10 MG tablet Commonly known as: GLUCOTROL Take 10 mg by mouth daily before breakfast.   rosuvastatin  40 MG tablet Commonly known as: CRESTOR Take 1 tablet (40 mg total) by mouth daily.   traMADol 50 MG tablet Commonly known as: ULTRAM Take 1 tablet (50 mg total) by mouth every 6 (six) hours as needed for moderate pain (mild pain).   True Metrix Blood Glucose Test test strip Generic drug: glucose blood Use segn las indicaciones. (Use as instructed)               Discharge Care Instructions  (From admission, onward)           Start     Ordered   10/31/22 0000  Discharge wound care:       Comments: It is good for closed incisions and even open wounds to be washed every day.  Shower every day.  Short baths are fine.  Wash the incisions and wounds clean with soap & water.    You may leave closed incisions open to air if it is dry.   You may cover the incision with clean gauze & replace it after your daily shower for comfort.  DERMABOND:  You have purple skin glue (Dermabond) on your incision(s).  Leave them in place, and they will fall off on their own like a scab in 2-3 weeks.  You may trim any edges that curl up with clean scissors.   10/31/22 0939            Significant Diagnostic Studies:  Results for orders placed or performed during the hospital encounter of 10/26/22 (from the past 72 hour(s))  Glucose, capillary     Status: Abnormal   Collection Time: 10/28/22 11:43 AM  Result Value Ref Range   Glucose-Capillary 126 (H) 70 - 99 mg/dL    Comment: Glucose reference range applies only to samples taken after fasting for at least 8 hours.   Comment 1 Notify RN   Calcium     Status:  Abnormal   Collection Time: 10/28/22 12:14 PM  Result Value Ref Range   Calcium 6.2 (LL) 8.9 - 10.3 mg/dL    Comment: CRITICAL RESULT CALLED TO, READ BACK BY AND VERIFIED WITH Nadara Mustard, RN AT 1401 03.27.24 D. BLU Performed at [facility], [place] [address] [place], [place] [address]   Calcium     Status: Abnormal   Collection Time: 10/28/22  4:22 PM  Result Value Ref Range   Calcium 6.7 (L) 8.9 - 10.3 mg/dL    Comment: Performed at [facility] [address] [place], [place] [address]  Calcium, ionized     Status: Abnormal   Collection Time: 10/28/22  4:22 PM  Result Value Ref Range   Calcium, Ionized, Serum 3.5 (L) 4.5 - 5.6 mg/dL    Comment: (NOTE) Performed At: [facility] [place], [place] [id number] Rush Farmer MD [id number]   Glucose, capillary     Status: Abnormal   Collection Time: 10/28/22  5:09 PM  Result Value Ref Range   Glucose-Capillary 168 (H) 70 - 99 mg/dL    Comment: Glucose reference range applies only to samples taken after fasting for at least 8 hours.   Comment 1 Notify RN   Calcium     Status: Abnormal   Collection Time: 10/28/22  8:08 PM  Result Value Ref Range   Calcium 6.3 (LL) 8.9 - 10.3 mg/dL    Comment: CRITICAL RESULT CALLED TO, READ BACK BY AND VERIFIED WITH R. REIV RN 10/28/22 @2114  BY J.WHITE Performed at [facility], [place] [address], [place], [place] [address]   Glucose, capillary     Status: None   Collection Time: 10/28/22  9:47 PM  Result Value Ref Range   Glucose-Capillary 80 70 - 99 mg/dL    Comment: Glucose reference range applies only to samples taken after fasting for at least 8 hours.  Calcium     Status: Abnormal   Collection Time: 10/29/22 12:21 AM  Result Value Ref Range   Calcium 6.9 (L) 8.9 - 10.3 mg/dL    Comment: Performed at [facility] [address], [place], [place] [address]  Renal function panel     Status: Abnormal   Collection Time: 10/29/22  4:20 AM  Result  Value Ref Range   Sodium 132 (L) 135 - 145 mmol/L   Potassium 5.4 (H) 3.5 - 5.1 mmol/L   Chloride 93 (L) 98 - 111 mmol/L   CO2 24 22 - 32 mmol/L   Glucose, Bld 109 (H) 70 - 99 mg/dL    Comment: Glucose reference range applies only to samples taken after fasting for at least 8 hours.   BUN 51 (H) 6 - 20 mg/dL   Creatinine, Ser 9.02 (H) 0.61 - 1.24 mg/dL   Calcium 6.2 (LL) 8.9 - 10.3 mg/dL    Comment: CRITICAL RESULT CALLED TO, READ BACK BY AND VERIFIED WITH R. REIDV RN 10/29/22 @0500  BY J. WHITE   Phosphorus 3.6 2.5 - 4.6 mg/dL   Albumin 2.9 (L) 3.5 - 5.0 g/dL   GFR, Estimated 6 (L) >60 mL/min    Comment: (NOTE) Calculated using the CKD-EPI Creatinine Equation (2021)    Anion gap 15 5 - 15    Comment: Performed at [facility] [address], [place], [place] [address]  CBC     Status: Abnormal   Collection Time: 10/29/22  8:30 AM  Result Value Ref Range   WBC 8.5 4.0 - 10.5 K/uL   RBC 3.36 (L) 4.22 - 5.81 MIL/uL   Hemoglobin 10.1 (L) 13.0 - 17.0 g/dL   HCT 31.6 (L) 39.0 - 52.0 %   MCV 94.0 80.0 - 100.0 fL   MCH 30.1 26.0 - 34.0 pg   MCHC 32.0 30.0 - 36.0 g/dL   RDW 14.8 11.5 - 15.5 %   Platelets 201 150 - 400 K/uL   nRBC 0.0 0.0 - 0.2 %    Comment: Performed at [facility], [place] [address], [place], [place] [address]  Glucose, capillary     Status: Abnormal   Collection Time: 10/29/22  1:37 PM  Result Value Ref Range   Glucose-Capillary 60 (L) 70 - 99 mg/dL    Comment: Glucose reference range applies only to samples taken after fasting for at least 8 hours.  Calcium     Status: Abnormal   Collection Time: 10/29/22  1:38 PM  Result Value Ref Range   Calcium 7.5 (L) 8.9 - 10.3 mg/dL    Comment: Performed at [facility] [address] [place] [address], [place], [place] [address]  Glucose, capillary     Status: Abnormal   Collection Time: 10/29/22  3:11 PM  Result Value Ref Range   Glucose-Capillary 185 (H) 70 - 99 mg/dL    Comment: Glucose reference range applies only  to samples taken after fasting for at least 8 hours.  Calcium     Status: Abnormal   Collection Time: 10/29/22  4:36 PM  Result Value Ref Range   Calcium 6.9 (  L) 8.9 - 10.3 mg/dL    Comment: Performed at [facility], [place] [address], [place], [place] [address]  Calcium     Status: Abnormal   Collection Time: 10/29/22  8:02 PM  Result Value Ref Range   Calcium 6.8 (L) 8.9 - 10.3 mg/dL    Comment: Performed at [facility] [address], [place], [place] [address]  Troponin I (High Sensitivity)     Status: Abnormal   Collection Time: 10/29/22  8:38 PM  Result Value Ref Range   Troponin I (High Sensitivity) 20 (H) <18 ng/L    Comment: (NOTE) Elevated high sensitivity troponin I (hsTnI) values and significant  changes across serial measurements may suggest ACS but many other  chronic and acute conditions are known to elevate hsTnI results.  Refer to the "Links" section for chest pain algorithms and additional  guidance. Performed at [facility], [place] [address] [place], [place] [address]   Glucose, capillary     Status: None   Collection Time: 10/29/22  9:00 PM  Result Value Ref Range   Glucose-Capillary 83 70 - 99 mg/dL    Comment: Glucose reference range applies only to samples taken after fasting for at least 8 hours.  Troponin I (High Sensitivity)     Status: Abnormal   Collection Time: 10/29/22 11:15 PM  Result Value Ref Range   Troponin I (High Sensitivity) 19 (H) <18 ng/L    Comment: (NOTE) Elevated high sensitivity troponin I (hsTnI) values and significant  changes across serial measurements may suggest ACS but many other  chronic and acute conditions are known to elevate hsTnI results.  Refer to the "Links" section for chest pain algorithms and additional  guidance. Performed at [facility], [place] [address], [place], [place] [address]   Calcium     Status: Abnormal   Collection Time: 10/30/22 12:53 AM  Result Value Ref Range   Calcium 6.4  (LL) 8.9 - 10.3 mg/dL    Comment: CRITICAL RESULT CALLED TO, READ BACK BY AND VERIFIED WITH Debby Freiberg RN 10/30/22 0139 Wiliam Ke Performed at [facility], [place] [address] [place], [place] [address]   Renal function panel     Status: Abnormal   Collection Time: 10/30/22  4:04 AM  Result Value Ref Range   Sodium 137 135 - 145 mmol/L   Potassium 4.6 3.5 - 5.1 mmol/L   Chloride 94 (L) 98 - 111 mmol/L   CO2 27 22 - 32 mmol/L   Glucose, Bld 64 (L) 70 - 99 mg/dL    Comment: Glucose reference range applies only to samples taken after fasting for at least 8 hours.   BUN 36 (H) 6 - 20 mg/dL   Creatinine, Ser 6.63 (H) 0.61 - 1.24 mg/dL   Calcium 7.0 (L) 8.9 - 10.3 mg/dL   Phosphorus 2.7 2.5 - 4.6 mg/dL   Albumin 3.0 (L) 3.5 - 5.0 g/dL   GFR, Estimated 9 (L) >60 mL/min    Comment: (NOTE) Calculated using the CKD-EPI Creatinine Equation (2021)    Anion gap 16 (H) 5 - 15    Comment: Performed at [facility] [address], [place], [place] [address]  Calcium     Status: Abnormal   Collection Time: 10/30/22  8:44 AM  Result Value Ref Range   Calcium 6.9 (L) 8.9 - 10.3 mg/dL    Comment: Performed at [facility] [place],  [place] [address]  Glucose, capillary     Status: Abnormal   Collection Time: 10/30/22  9:00 AM  Result Value Ref Range   Glucose-Capillary 106 (H) 70 - 99 mg/dL    Comment: Glucose reference range applies only to samples taken after fasting for at least 8 hours.  Glucose, capillary     Status: Abnormal   Collection Time: 10/30/22 12:18 PM  Result Value Ref Range   Glucose-Capillary 149 (H) 70 - 99 mg/dL    Comment: Glucose reference range applies only to samples taken after fasting for at least 8 hours.  Glucose, capillary     Status: Abnormal   Collection Time: 10/30/22  4:35 PM  Result Value Ref Range   Glucose-Capillary 176 (H) 70 - 99 mg/dL    Comment: Glucose reference range applies only to samples taken after fasting for at  least 8 hours.  Calcium     Status: Abnormal   Collection Time: 10/30/22  5:58 PM  Result Value Ref Range   Calcium 6.9 (L) 8.9 - 10.3 mg/dL    Comment: Performed at [facility] [address] [place], [place] [address]  Glucose, capillary     Status: None   Collection Time: 10/30/22  9:22 PM  Result Value Ref Range   Glucose-Capillary 76 70 - 99 mg/dL    Comment: Glucose reference range applies only to samples taken after fasting for at least 8 hours.  Renal function panel     Status: Abnormal   Collection Time: 10/31/22  6:11 AM  Result Value Ref Range   Sodium 138 135 - 145 mmol/L   Potassium 5.5 (H) 3.5 - 5.1 mmol/L   Chloride 96 (L) 98 - 111 mmol/L   CO2 26 22 - 32 mmol/L   Glucose, Bld 81 70 - 99 mg/dL    Comment: Glucose reference range applies only to samples taken after fasting for at least 8 hours.   BUN 63 (H) 6 - 20 mg/dL   Creatinine, Ser 8.77 (H) 0.61 - 1.24 mg/dL   Calcium 7.2 (L) 8.9 - 10.3 mg/dL   Phosphorus 2.8 2.5 - 4.6 mg/dL   Albumin 3.1 (L) 3.5 - 5.0 g/dL   GFR, Estimated 7 (L) >60 mL/min    Comment: (NOTE) Calculated using the CKD-EPI Creatinine Equation (2021)    Anion gap 16 (H) 5 - 15    Comment: Performed at [facility] [address] [place], [place] [address]  Glucose, capillary     Status: Abnormal   Collection Time: 10/31/22  8:03 AM  Result Value Ref Range   Glucose-Capillary 68 (L) 70 - 99 mg/dL    Comment: Glucose reference range applies only to samples taken after fasting for at least 8 hours.    No results found.  Past Medical History:  Diagnosis Date   Acute respiratory failure with hypoxia ([place]) 09/07/2018   Anemia of chronic kidney failure 03/11/2017   Cataracts, bilateral 2019   Chest pain 09/01/2018   minimal luminal irregularities   Chronic diastolic CHF (congestive heart failure) ([place])    Chronic kidney disease 03/11/2017   Dialysis Tu/Th/Sa   COVID 2022   hospitalized   Diabetes mellitus without  complication ([place]) [id number]   Edema 01/14/2017   Headache    Hypercholesterolemia    Hypertension 03/11/2017   Hypertensive retinopathy of both eyes 2019   Iron deficiency anemia 06/18/2016   Pneumonia 09/07/2018   Proliferative diabetic retinopathy with macular edema ([place]) 2019  Retinal hemorrhage, bilateral and left vitreous hemorrhage.  Being treated with Lucentis.  Dr. Iona Hansen   RSV (respiratory syncytial virus infection) 08/2017    Past Surgical History:  Procedure Laterality Date   A/V FISTULAGRAM Left 01/19/2018   Procedure: A/V FISTULAGRAM - Left AV;  Surgeon: Waynetta Sandy, MD;  Location: [facility];  Service: Cardiovascular;  Laterality: Left;   AV FISTULA PLACEMENT Left 08/09/2017   Procedure: LEFT RADIOCEPHALIC ARTERIOVENOUS (AV) FISTULA CREATION;  Surgeon: [name], MD;  [facility];  Service: Vascular;  Laterality: Left;   AV FISTULA PLACEMENT Left 02/02/2018   Procedure: ARTERIOVENOUS (AV) FISTULA CREATION LEFT UPPER EXTREMITY;  Surgeon: [name], MD;  Location: [facility];  Service: Vascular;  Laterality: Left;   INSERTION OF DIALYSIS CATHETER  08/09/2017   INSERTION OF DIALYSIS CATHETER Right 08/09/2017   Procedure: INSERTION OF DIALYSIS CATHETER RIGHT INTERNAL JUGULAR;  Surgeon: [name], MD;  [facility];  Service: Vascular;  Laterality: Right;   IR DIALY SHUNT INTRO NEEDLE/INTRACATH INITIAL W/IMG LEFT Left 07/10/2020   IR DIALY SHUNT INTRO NEEDLE/INTRACATH INITIAL W/IMG LEFT Left 04/14/2021   IR REMOVAL TUN CV CATH W/O FL  07/01/2018   LEFT HEART CATH AND CORONARY ANGIOGRAPHY N/A 09/05/2018   Procedure: LEFT HEART CATH AND CORONARY ANGIOGRAPHY;  Surgeon: [name], MD;  Location: [facility];  Service: Cardiovascular;  Laterality: N/A;   OPEN REDUCTION INTERNAL FIXATION (ORIF) DISTAL RADIAL FRACTURE Left 11/20/2021   Procedure: POSSIBLE OPEN REDUCTION INTERNAL FIXATION (ORIF) DISTAL RADIAL FRACTURE;  Surgeon: [name],  MD;  Location: [facility];  Service: Orthopedics;  Laterality: Left;   ORIF ANKLE FRACTURE Left 11/20/2021   Procedure: OPEN REDUCTION INTERNAL FIXATION (ORIF) ANKLE PILON FRACTURE;  Surgeon: [name], MD;  Location: [facility];  Service: Orthopedics;  Laterality: Left;   ORIF TIBIA PLATEAU Right 11/20/2021   Procedure: OPEN REDUCTION INTERNAL FIXATION (ORIF) TIBIAL PLATEAU;  Surgeon: [name], MD;  Location: [facility];  Service: Orthopedics;  Laterality: Right;   PARATHYROIDECTOMY N/A 10/26/2022   Procedure: total PARATHYROIDECTOMY;  Surgeon: [name], MD;  Location: [facility];  Service: General;  Laterality: N/A;   PARATHYROIDECTOMY Right 10/26/2022   Procedure: AUTOTRANSPLANT TO RIGHT FOREARM;  Surgeon: [name], MD;  [facility];  Service: General;  Laterality: Right;    Social History   Socioeconomic History   Marital status: Married    Spouse name: [name]   Number of children: 1   Years of education: 9   Highest education level: Not on file  Occupational History   Occupation: Painter  Tobacco Use   Smoking status: Former    Types: Cigarettes    Passive exposure: Past   Smokeless tobacco: Never  Vaping Use   Vaping Use: Never used  Substance and Sexual Activity   Alcohol use: Not Currently   Drug use: No   Sexual activity: Not on file  Other Topics Concern   Not on file  Social History Narrative   Originally from [place]   Came to [place] [employer] In [place] with his girlfriend and their son.   Social Determinants of Health   Financial Resource Strain: Not on file  Food Insecurity: No Food Insecurity (10/26/2022)   Hunger Vital Sign    Worried About Running Out of Food in the Last Year: Never true    Ran Out of Food in the Last Year: Never true  Transportation Needs: No  Transportation Needs (10/26/2022)   PRAPARE - Hydrologist (Medical): No    Lack of Transportation (Non-Medical): No  Physical Activity: Not on file  Stress: Not on  file  Social Connections: Not on file  Intimate Partner Violence: Not At Risk (10/26/2022)   Humiliation, Afraid, Rape, and Kick questionnaire    Fear of Current or Ex-Partner: No    Emotionally Abused: No    Physically Abused: No    Sexually Abused: No    Family History  Problem Relation Age of Onset   Diabetes Mother    Kidney disease Mother        renal failure--diabetes, cause of death   Hypertension Mother    Diabetes Father    Kidney disease Father        Cause of death:  kidney failure form diabetes   Alcohol abuse Father    Diabetes Sister     Current Facility-Administered Medications  Medication Dose Route Frequency Provider Last Rate Last Admin   0.9 %  sodium chloride infusion   Intravenous Continuous [name], MD 10 mL/hr at 10/30/22 1115 New Bag at 10/30/22 1115   acetaminophen (TYLENOL) tablet 650 mg  650 mg Oral Q6H PRN [name], MD   650 mg at 10/28/22 2228   Or   acetaminophen (TYLENOL) suppository 650 mg  650 mg Rectal Q6H PRN [name], MD       amLODipine (NORVASC) tablet 10 mg  10 mg Oral QHS Collins, Samantha G, PA-C   10 mg at 10/30/22 2108   calcitRIOL (ROCALTROL) capsule 3 mcg  3 mcg Oral BID Reesa Chew, MD   3 mcg at 10/30/22 2108   calcium carbonate (TUMS - dosed in mg elemental calcium) chewable tablet 1,000 mg of elemental calcium  1,000 mg of elemental calcium Oral QID Reesa Chew, MD   1,000 mg of elemental calcium at 10/31/22 0923   carvedilol (COREG) tablet 12.5 mg  12.5 mg Oral BID WC [name], MD   12.5 mg at 10/31/22 [id number]   Chlorhexidine Gluconate Cloth 2 % PADS 6 each  6 each Topical Q0600 Janalee Dane, PA-C   6 each at 10/29/22 0551   Chlorhexidine Gluconate Cloth 2 % PADS 6 each  6 each Topical Q0600 Janalee Dane, PA-C   6 each at 10/31/22 0558   glipiZIDE (GLUCOTROL) tablet 10 mg  10 mg Oral QAC breakfast [name], MD   10 mg at 10/31/22 [id number]   HYDROmorphone (DILAUDID) injection 1 mg  1 mg  Intravenous Q2H PRN [name], MD       ondansetron (ZOFRAN-ODT) disintegrating tablet 4 mg  4 mg Oral Q6H PRN [name], MD       Or   ondansetron (ZOFRAN) injection 4 mg  4 mg Intravenous Q6H PRN [name], MD   4 mg at 10/26/22 1728   oxyCODONE (Oxy IR/ROXICODONE) immediate release tablet 5-10 mg  5-10 mg Oral Q4H PRN [name], MD   5 mg at 10/30/22 0322   traMADol (ULTRAM) tablet 50 mg  50 mg Oral Q6H PRN [name], MD   50 mg at 10/27/22 1325     No Known Allergies  Signed:   Adin Hector, MD, FACS, MASCRS Esophageal, Gastrointestinal & Colorectal Surgery Robotic and Minimally Invasive Surgery  [facility] [place] [id number] [place], [place] [address] [phone] Fax [phone] Main  CONTACT  INFORMATION:  Weekday (9AM-5PM): Call [facility] main office at [phone]  Weeknight (5PM-9AM) or Weekend/Holiday: Check [email or web address] (password " [email or web address]") for General Surgery [facility] coverage  (Please, do not use SecureChat as it is not reliable communication to reach operating surgeons for immediate patient care given surgeries/outpatient duties/clinic/cross-coverage/off post-call which would lead to a delay in care.  Epic staff messaging available for outptient concerns, but may not be answered for 48 hours or more).     10/31/2022, 9:39 AM

## 2022-10-31 NOTE — Progress Notes (Signed)
[facility] Progress Note   Subjective:   Seen in room - feels well today. No CP/dyspnea. Ca 7.2 today - hopefully no more IV Ca needed. He is due for HD today, should be ok to discharge after. Spoke with patient in spanish about importance of taking TUMS 1000mg  QID and Calcitriol 56mcg BID, and to stop lanthanum and cinacalcet -> written instructions given.  Objective Vitals:   10/30/22 1639 10/30/22 2123 10/31/22 0520 10/31/22 0802  BP: (!) 140/64 (!) 156/56 (!) 147/66 (!) 151/68  Pulse: 66 66 66 67  Resp: 17   17  Temp: 98.3 F (36.8 C) 98 F (36.7 C) 97.8 F (36.6 C) 98 F (36.7 C)  TempSrc: Oral   Oral  SpO2: 96% 94% 95% 96%  Weight:      Height:       Physical Exam General: Well appearing man, NAD. Room air Heart: RRR; no murmur Lungs: CTA anteriorly Abdomen: soft Extremities: no LE edema Dialysis Access: AVF + thrill  Additional Objective Labs: Basic Metabolic Panel: Recent Labs  Lab 10/29/22 0420 10/29/22 1338 10/30/22 0404 10/30/22 0844 10/30/22 1758 10/31/22 0611  NA 132*  --  137  --   --  138  K 5.4*  --  4.6  --   --  5.5*  CL 93*  --  94*  --   --  96*  CO2 24  --  27  --   --  26  GLUCOSE 109*  --  64*  --   --  81  BUN 51*  --  36*  --   --  63*  CREATININE 9.02*  --  6.63*  --   --  8.77*  CALCIUM 6.2*   < > 7.0* 6.9* 6.9* 7.2*  PHOS 3.6  --  2.7  --   --  2.8   < > = values in this interval not displayed.   Liver Function Tests: Recent Labs  Lab 10/29/22 0420 10/30/22 0404 10/31/22 0611  ALBUMIN 2.9* 3.0* 3.1*   CBC: Recent Labs  Lab 10/27/22 0700 10/27/22 0810 10/29/22 0830  WBC 9.1 9.8 8.5  HGB 10.7* 10.6* 10.1*  HCT 32.3* 32.4* 31.6*  MCV 93.4 93.6 94.0  PLT 191 191 201   Medications:  sodium chloride 10 mL/hr at 10/30/22 1115    amLODipine  10 mg Oral QHS   calcitRIOL  3 mcg Oral BID   calcium carbonate  1,000 mg of elemental calcium Oral QID   carvedilol  12.5 mg Oral BID WC   Chlorhexidine Gluconate  Cloth  6 each Topical Q0600   Chlorhexidine Gluconate Cloth  6 each Topical Q0600   glipiZIDE  10 mg Oral QAC breakfast    Dialysis Orders: [place]  on TTS 180NRe 3hr 65min BFR 400 DFR 800 EDW 62kg 2K 2Ca AVF 15g Heparin 2000 unit bolus - Sensipar 180mg  daily at home -> DISCONTINUED - Fosrenol 1000mg  2 tabs TID AC as binder -> now DISCONTINUED   Assessment/Plan: Severe hyperparathyroidism s/p parathyroidectomy and auto-transplant 0000000: Complicated by hypocalcemia/hungry bone syndrome. Currently on PO calcitriol 35mcg BID (started 3/29 up from 2mg ) and Tums 1000mg  QID (started 3/29, up from TID). S/p multiple doses of IV CaGlu - hold for now. Lanthanum and Sensipar on hold. Likely ok for discharge after HD today. ESRD: Continue HD on TTS schedule - HD today. Will change outpatient HD bath to 3Ca. Chest pain s/p HD 3/28 - better now, normal EKG, follow. Hypertension/volume: BP controlled,  euvolemic on exam. Continue home meds. Anemia: Hgb 10.1 - monitor without ESA for now. Lip pain: Seems to have resolved - follow. T2DM: On glipizide + insulin SS.  Veneta Penton, PA-C 10/31/2022, 9:49 AM  [employer]

## 2022-10-31 NOTE — Progress Notes (Addendum)
Received patient in bed.Awake ,alert and oriented x 4.  Access used: Left upper arm AVF that worked well.  Medicine given:None  Duration of treatment: 3.5 hours  Fluid removed : Achieved prescribed UF goal of 1 liter  Hemo issue/comment:He tolerated his treatment well.  Hand off to the patient's nurse.Marland Kitchen

## 2022-10-31 NOTE — Discharge Instructions (Addendum)
THYROID SURGERY: POST OP INSTRUCTIONS  ######################################################################  EAT Gradually transition to a high fiber diet with a fiber supplement over the next few weeks after discharge.  Start with a pureed / full liquid diet (see below)  WALK Walk an hour a day.  Control your pain to do that.    CONTROL PAIN Control pain so that you can walk, sleep, tolerate sneezing/coughing, go up/down stairs.  HAVE A BOWEL MOVEMENT DAILY Keep your bowels regular to avoid problems.  OK to try a laxative to override constipation.  OK to use an antidairrheal to slow down diarrhea.  Call if not better after 2 tries  CALL IF YOU HAVE PROBLEMS/CONCERNS Call if you are still struggling despite following these instructions. Call if you have concerns not answered by these instructions  ######################################################################    DIET: Follow a light bland diet & liquids the first 24 hours after arrival home, such as soup, liquids, starches, etc.  Be sure to drink plenty of fluids.  Quickly advance to a usual solid diet within a few days.  Avoid fast food or heavy meals as your are more likely to get nauseated or have irregular bowels.  A low-fat, high-fiber diet for the rest of your life is ideal.    You should only be taking Tums (which contain calcium) and oral Calcitriol which helps your body absorb calcium.  See instructions.  Nephrology ultimately will help adjust that along with your surgeon Dr. Harlow Asa.  PAIN CONTROL: Pain is best controlled by a usual combination of three different methods TOGETHER: Ice/Heat Over the counter pain medication Prescription pain medication Most patients will experience some swelling and bruising around the incisions.  Ice packs or heating pads (30-60 minutes up to 6 times a day) will help. Use ice for the first few days to help decrease swelling and bruising, then switch to heat to help relax tight/sore  spots and speed recovery.  Some people prefer to use ice alone, heat alone, alternating between ice & heat.  Experiment to what works for you.  Swelling and bruising can take several weeks to resolve.   It is helpful to take an over-the-counter pain medication regularly for the first few weeks.  Choose Acetaminophen (Tylenol, etc) 500-650mg  four times a day (every meal & bedtime) A  prescription for pain medication (tramadol) should be given to you upon discharge.  Take your pain medication as prescribed.  If you are having problems/concerns with the prescription medicine (does not control pain, nausea, vomiting, rash, itching, etc), please call us [phone] to see if we need to switch you to a different pain medicine that will work better for you and/or control your side effect better. If you need a refill on your pain medication, please contact your pharmacy.  They will contact our office to request authorization. Prescriptions will not be filled after 5 pm or on week-ends.  Avoid getting constipated.  Between the surgery and the pain medications, it is common to experience some constipation.  Increasing fluid intake and taking a fiber supplement (such as Metamucil, Citrucel, FiberCon, MiraLax, etc) 1-2 times a day regularly will usually help prevent this problem from occurring.  A mild laxative (prune juice, Milk of Magnesia, MiraLax, etc) should be taken according to package directions if there are no bowel movements after 48 hours.    Wash / shower every day.  You may shower over the dressings & any skin tapes as they are waterproof.  Continue to shower over incision(s) after  the dressing is off.  It is good for closed incisions and even open wounds to be washed every day.  Shower every day.  Short baths are fine.  Wash the incisions and wounds clean with soap & water.    You may leave closed incisions open to air if it is dry.   You may cover the incision with clean gauze & replace it after  your daily shower for comfort.  DERMABOND:  You have purple skin glue (Dermabond) on your incision(s).  Leave them in place, and they will fall off on their own like a scab in 2-3 weeks.  You may trim any edges that curl up with clean scissors.   ACTIVITIES as tolerated:   You may resume regular (light) daily activities beginning the next day--such as daily self-care, walking, climbing stairs--gradually increasing activities as tolerated.  If you can walk 30 minutes without difficulty, it is safe to try more intense activity such as jogging, treadmill, bicycling, low-impact aerobics, swimming, etc. Save the most intensive and strenuous activity for last such as sit-ups, heavy lifting, contact sports, etc  Refrain from any heavy lifting or straining until you are off narcotics for pain control.   DO NOT PUSH THROUGH PAIN.  Let pain be your guide: If it hurts to do something, don't do it.  Pain is your body warning you to avoid that activity for another week until the pain goes down. You may drive when you are no longer taking prescription pain medication, you can comfortably wear a seatbelt, and you can safely maneuver your car and apply brakes. You may have sexual intercourse when it is comfortable.   FOLLOW UP in our office Please call [facility] at [phone] to set up an appointment to see your surgeon in the office for a follow-up appointment approximately 2-3 weeks after your surgery. Make sure that you call for this appointment the day you arrive home to insure a convenient appointment time. 9. IF YOU HAVE DISABILITY OR FAMILY LEAVE FORMS, BRING THEM TO THE OFFICE FOR PROCESSING.  DO NOT GIVE THEM TO YOUR DOCTOR.   WHEN TO CALL us [phone]: Poor pain control Reactions / problems with new medications (rash/itching, nausea, etc)  Fever over 101.5 F (38.5 C) Worsening swelling or bruising Continued bleeding from incision. Increased pain, redness, or drainage from the  incision Difficulty breathing / swallowing   The clinic staff is available to answer your questions during regular business hours (8:30am-5pm).  Please don't hesitate to call and ask to speak to one of our nurses for clinical concerns.   If you have a medical emergency, go to the nearest emergency room or call 911.  A surgeon from [facility] is always on call at the [facility], [place], [place], [place], [place]  [address] ? MAIN: [phone] ? TOLL FREE: [phone] ?  FAX [phone] [email or web address]    #################################  CIRUGA DE TIROIDES: INSTRUCCIONES POST OPERACIONALES  ################################################## ####################  COMER Haga una transicin gradual a una dieta rica en fibra con un suplemento de fibra durante las prximas semanas despus del alta. Comience con una dieta lquida o en pur (ver ms abajo)  CAMINAR [place] una hora al da. Controla tu dolor para hacer eso.  CONTROLAR EL DOLOR Controle el dolor para que pueda caminar, dormir, Tree surgeon estornudos/tos, subir/bajar escaleras.  DECIRAR DIARIAMENTE Mantenga sus intestinos regulares para evitar problemas. Est bien probar un laxante para anular el  estreimiento. Est bien usar un antidiarreico para Personnel officer. Llame si no mejora despus de 2 intentos.  LLAME SI TIENE PROBLEMAS/INQUIETUDES Llame si todava tiene dificultades a pesar de seguir estas instrucciones. Llame si tiene inquietudes que estas instrucciones no responden  ################################################## ####################    1. DIETA: Siga una dieta ligera y blanda y [place] primeras 24 horas despus de llegar a casa, como sopa, lquidos, almidones, etc. Asegrese de beber muchos lquidos. Avance rpidamente a una dieta slida habitual en unos [employer]. Evite la comida rpida o las comidas pesadas, ya que  es ms probable que sienta nuseas o tenga deposiciones irregulares. Lo ideal es una dieta baja en grasas y rica en fibra durante el resto de su vida.  Slo debe tomar Tums (que contiene calcio) y calcitriol oral, que ayuda a su cuerpo a absorber el calcio. [place]. En ltima instancia, la nefrologa ayudar a ajustar eso junto con su cirujano, el Dr. Harlow Asa.  2. CONTROL DEL DOLOR: a. El dolor se controla mejor mediante una combinacin habitual de tres mtodos diferentes JUNTOS: i. Hielo/Calor ii. [place] Analgsicos recetados b. La mayora de los pacientes experimentarn algo de hinchazn y hematomas alrededor de las incisiones. Las compresas de hielo o las almohadillas trmicas (de 30 a 60 minutos, hasta 6 veces al da) ayudarn. Use hielo durante los primeros das para ayudar a disminuir la hinchazn y los hematomas, luego cambie a calor para ayudar a Media planner los puntos tensos o doloridos y Engineer, water. Algunas Academic librarian solo hielo, solo calor, alternando entre hielo y Freight forwarder. Experimente con lo que funcione para usted. La hinchazn y los hematomas pueden tardar varias semanas en desaparecer. C. Es til tomar analgsicos de venta libre con regularidad durante las primeras semanas. Elija acetaminofn (Tylenol, etc.) 500-650 mg cuatro veces al da (en cada comida y antes de [place]) d. Se le debe dar una receta para un analgsico (tramadol) al momento del alta. Tome sus analgsicos segn lo recetado. i. Si tiene problemas o inquietudes con el medicamento recetado (no controla el dolor, las nuseas, los vmitos, el sarpullido, la picazn, etc.), llmenos al [phone] para ver si necesitamos cambiarlo a un analgsico diferente. que funcionarn mejor para usted y/o Geologist, engineering mejor sus efectos secundarios. ii. Si necesita un resurtido de Corporate investment banker, comunquese con su farmacia. Se comunicarn con nuestra oficina para solicitar  autorizacin. Las recetas no se surtirn despus de las 5 p. m. ni los fines de [place]. III. 3. Evite el estreimiento. Entre la [place] y los analgsicos, es comn experimentar algo de estreimiento. Aumentar la ingesta de lquidos y tomar un suplemento de fibra (como Metamucil, Citrucel, FiberCon, [place], etc.) 1 o 2 veces al da con regularidad generalmente ayudar a prevenir que ocurra este problema. Se debe tomar un laxante suave (jugo de ciruela pasa, leche de magnesia, [place], etc.) segn las instrucciones del paquete si no hay deposiciones despus de 48 horas.  4. Lvese/dchese todos los [place]. Puede ducharse sobre los vendajes y las cintas cutneas, ya que son impermeables. Contine duchndose sobre la(s) incisin(es) despus de quitarle el vendaje.  Lemay heridas abiertas todos los das. [place]. Los baos cortos [employer] [place] y heridas con agua y [place]. 6. Puede dejar las incisiones cerradas abiertas al aire si est seco. Puede cubrir la incisin con una gasa limpia y reemplazarla despus de la ducha diaria para  mayor comodidad.  7. DERMABOND: Tiene pegamento cutneo morado (Dermabond) en la(s) incisin(es). Djalos en su lugar y se caern solos como una costra en 2 o 3 semanas. Puedes recortar los bordes que se enrollen con unas tijeras limpias.   8. ACTIVIDADES toleradas: a. Puede reanudar las actividades diarias habituales (ligeras) a Proofreader del da siguiente, como el cuidado personal diario, Writer, subir escaleras, aumentando gradualmente las actividades segn las [place]. Si puede caminar 30 minutos sin dificultad, es Hydrologist actividades ms intensas como trotar, cinta de correr, andar en bicicleta, ejercicios aerbicos de bajo impacto, nadar, etc. b. Guarde la actividad ms intensa y extenuante para el final, como abdominales, levantamiento de objetos pesados, deportes de Diplomatic Services operational officer,  etc. Abstngase de levantar objetos pesados o hacer esfuerzos hasta que deje de tomar narcticos para Financial controller. C. NO PARES A TRAVS DEL DOLOR. Deja que el dolor sea tu gua: si te duele hacer algo, no lo hagas. El dolor es que su cuerpo le advierte que evite esa actividad durante una semana ms hasta que el dolor disminuya. d. Ardis Rowan conducir cuando ya no est tomando analgsicos recetados. medicamento, puede usar cmodamente el cinturn de seguridad y Warehouse manager su automvil y Midwife los frenos con seguridad. mi. Puede tener relaciones sexuales cuando le resulte cmodo. F. 9. SEGUIMIENTO en nuestra oficina a. Llame a [facility] al [phone] para programar una cita con su cirujano en el consultorio para una cita de seguimiento aproximadamente 2 a 3 semanas despus de su ciruga. bIlona Sorrel de llamar para programar esta cita el da que llegue a casa para asegurar una hora conveniente para la cita. 9. SI [place], TRIGALOS A LA OFICINA PARA SU PROCESAMIENTO. NO SE LOS D A SU MDICO.   CUNDO LLAMARNOS [phone]: 1. Mal control del dolor 2. Reacciones/problemas con nuevos medicamentos (sarpullido/picazn, nuseas, etc.) 3. Fiebre superior a 101,5 F (38,5 C) 4. Empeoramiento de la hinchazn o hematomas 5. Sangrado continuo de la incisin. 6. Aumento del dolor, enrojecimiento o drenaje de la incisin 7. Dificultad para respirar/tragar  El personal de la clnica est disponible para responder sus preguntas durante el horario comercial habitual (de 8:30 a. m. a 5 p. m.). No dude en llamar y pedir hablar con una de nuestras enfermeras si tiene inquietudes clnicas. Si tiene [employer], vaya a la sala de emergencias ms cercana o llame al 911. Un cirujano de [facility] est siempre de guardia en los hospitales.   [place], [place], [place], [place],  [place]  PRINCIPAL: [phone]  LLAMADA GRATUITA: [place] [phone] [email or web address]

## 2022-11-02 ENCOUNTER — Telehealth: Payer: Self-pay

## 2022-11-02 NOTE — Telephone Encounter (Signed)
Transition of Care - Initial Contact from Inpatient Facility  Date of discharge: 10/31/22 Date of contact:11/02/22   Method: Phone Spoke to: Patient's  son/ and wife   Patient contacted to discuss transition of care from recent inpatient hospitalization. Patient was admitted to [facility] from .10/26/22 to 10/31/22.Marland Kitchen with discharge diagnosis of .Marland KitchenSevere hyperparathyroidism s/p parathyroidectomy and auto-transplant 0000000: Complicated by hypocalcemia/hungry bone syndrome .  The discharge medication list was reviewed. Patient understands the changes and has no concerns.  Instructed on impotence of taking  po calcium meds   Patient will return to his/her outpatient HD unit on: tue 11/03/22   No other concerns at this time.

## 2022-11-02 NOTE — Progress Notes (Signed)
Late Note Entry  Pt was d/c on Saturday. Contacted [facility] this morning to advise clinic of pt's d/c date and that pt should resume care tomorrow.   Melven Sartorius Renal Navigator [phone]

## 2022-11-02 NOTE — Transitions of Care (Post Inpatient/ED Visit) (Signed)
   11/02/2022  Name: Blake Santana MRN: [id number] DOB: 12/02/1970  Today's TOC FU Call Status: Today's TOC FU Call Status:: Successful TOC FU Call Competed TOC FU Call Complete Date: 11/02/22  Transition Care Management Follow-up Telephone Call Date of Discharge: 10/31/22 Discharge Facility[facility]) Type of Discharge: Inpatient Admission Primary Inpatient Discharge Diagnosis:: parathyroidectomy How have you been since you were released from the hospital?: Better Any questions or concerns?: No  Items Reviewed: Did you receive and understand the discharge instructions provided?: Yes Medications obtained and verified?: Yes (Medications Reviewed) Any new allergies since your discharge?: No Dietary orders reviewed?: Yes Do you have support at home?: Yes People in Home: spouse, child(ren), adult  Home Care and Equipment/Supplies: [place] Ordered?: Yes Has Agency set up a time to come to your home?: No Any new equipment or medical supplies ordered?: NA  Functional Questionnaire: Do you need assistance with bathing/showering or dressing?: No Do you need assistance with meal preparation?: No Do you need assistance with eating?: No Do you have difficulty maintaining continence: No Do you need assistance with getting out of bed/getting out of a chair/moving?: No Do you have difficulty managing or taking your medications?: No  Follow up appointments reviewed: PCP Follow-up appointment confirmed?: [facility] Follow-up appointment confirmed?: No Reason Specialist Follow-Up Not Confirmed: Patient has Specialist Provider Number and will Call for Appointment Do you need transportation to your follow-up appointment?: No Do you understand care options if your condition(s) worsen?: Yes-patient verbalized understanding    [place], [place] Nurse Health Advisor Direct Dial [phone]

## 2022-11-04 ENCOUNTER — Encounter: Payer: Self-pay | Admitting: Vascular Surgery

## 2022-11-04 ENCOUNTER — Encounter (HOSPITAL_COMMUNITY): Payer: Self-pay

## 2022-11-04 ENCOUNTER — Ambulatory Visit (HOSPITAL_COMMUNITY)
Admission: RE | Admit: 2022-11-04 | Discharge: 2022-11-04 | Disposition: A | Discharge status: Home / Self Care | Payer: No Typology Code available for payment source | Source: Ambulatory Visit | Attending: Vascular Surgery | Admitting: Vascular Surgery

## 2022-11-04 ENCOUNTER — Ambulatory Visit (INDEPENDENT_AMBULATORY_CARE_PROVIDER_SITE_OTHER): Payer: No Typology Code available for payment source | Admitting: Vascular Surgery

## 2022-11-04 VITALS — BP 159/105 | HR 62 | Temp 97.9°F | Resp 20 | Ht 62.0 in | Wt 138.0 lb

## 2022-11-04 DIAGNOSIS — M79606 Pain in leg, unspecified: Secondary | ICD-10-CM

## 2022-11-04 DIAGNOSIS — N186 End stage renal disease: Secondary | ICD-10-CM

## 2022-11-04 DIAGNOSIS — M25562 Pain in left knee: Secondary | ICD-10-CM

## 2022-11-04 DIAGNOSIS — M25561 Pain in right knee: Secondary | ICD-10-CM

## 2022-11-04 LAB — VAS US ABI WITH/WO TBI

## 2022-11-04 NOTE — Progress Notes (Signed)
Patient ID: Blake Santana, male   DOB: 02-Nov-1970, 52 y.o.   MRN: [id number]  Reason for Consult: New Patient (Initial Visit)   Referred by Madelon Lips, MD  Subjective:     HPI:  Blake Santana is a 52 y.o. male with history of a fall and required operative repair of right lower extremity fractures and now with planning for pinning of his left ankle.  He does have pain in the right leg that occurs even at rest.  He currently walks with the help of a crutch.  He is on dialysis via left arm fistula.  Recently had parathyroid surgery at [facility] 9 days ago.  Denies any tissue loss or ulceration bilateral lower extremities.  No previous vascular interventions.  Risk factors for vascular disease include hypertension, diabetes, hypercholesterolemia.  Past Medical History:  Diagnosis Date   Acute respiratory failure with hypoxia 09/07/2018   Anemia of chronic kidney failure 03/11/2017   Cataracts, bilateral 2019   Chest pain 09/01/2018   minimal luminal irregularities   Chronic diastolic CHF (congestive heart failure)    Chronic kidney disease 03/11/2017   Dialysis Tu/Th/Sa   COVID 2022   hospitalized   Diabetes mellitus without complication [id number]   Edema 01/14/2017   Headache    Hypercholesterolemia    Hypertension 03/11/2017   Hypertensive retinopathy of both eyes 2019   Iron deficiency anemia 06/18/2016   Pneumonia 09/07/2018   Proliferative diabetic retinopathy with macular edema 2019   Retinal hemorrhage, bilateral and left vitreous hemorrhage.  Being treated with [employer].  Dr. Iona Hansen   RSV (respiratory syncytial virus infection) 08/2017   Family History  Problem Relation Age of Onset   Diabetes Mother    Kidney disease Mother        renal failure--diabetes, cause of death   Hypertension Mother    Diabetes Father    Kidney disease Father        Cause of death:  kidney failure form diabetes   Alcohol abuse Father    Diabetes Sister     Past Surgical History:  Procedure Laterality Date   A/V FISTULAGRAM Left 01/19/2018   Procedure: A/V FISTULAGRAM - Left AV;  Surgeon: Waynetta Sandy, MD;  Location: [place] [facility];  Service: Cardiovascular;  Laterality: Left;   AV FISTULA PLACEMENT Left 08/09/2017   Procedure: LEFT RADIOCEPHALIC ARTERIOVENOUS (AV) FISTULA CREATION;  Surgeon: [name], MD;  Location: [facility];  Service: Vascular;  Laterality: Left;   AV FISTULA PLACEMENT Left 02/02/2018   Procedure: ARTERIOVENOUS (AV) FISTULA CREATION LEFT UPPER EXTREMITY;  Surgeon: [name], MD;  Location: [place];  Service: Vascular;  Laterality: Left;   INSERTION OF DIALYSIS CATHETER  08/09/2017   INSERTION OF DIALYSIS CATHETER Right 08/09/2017   Procedure: INSERTION OF DIALYSIS CATHETER RIGHT INTERNAL JUGULAR;  Surgeon: [name], MD;  Location: [facility];  Service: Vascular;  Laterality: Right;   IR DIALY SHUNT INTRO NEEDLE/INTRACATH INITIAL W/IMG LEFT Left 07/10/2020   IR DIALY SHUNT INTRO NEEDLE/INTRACATH INITIAL W/IMG LEFT Left 04/14/2021   IR REMOVAL TUN CV CATH W/O FL  07/01/2018   LEFT HEART CATH AND CORONARY ANGIOGRAPHY N/A 09/05/2018   Procedure: LEFT HEART CATH AND CORONARY ANGIOGRAPHY;  Surgeon: [name], MD;  Location: [place] [facility];  Service: Cardiovascular;  Laterality: N/A;   OPEN REDUCTION INTERNAL FIXATION (ORIF) DISTAL RADIAL FRACTURE Left 11/20/2021   Procedure: POSSIBLE OPEN REDUCTION INTERNAL FIXATION (ORIF) DISTAL RADIAL FRACTURE;  Surgeon: [name], MD;  Location: [place];  Service: Orthopedics;  Laterality: Left;   ORIF ANKLE FRACTURE Left 11/20/2021   Procedure: OPEN REDUCTION INTERNAL FIXATION (ORIF) ANKLE PILON FRACTURE;  Surgeon: [name], MD;  Location: [place];  Service: Orthopedics;  Laterality: Left;   ORIF TIBIA PLATEAU Right 11/20/2021   Procedure: OPEN REDUCTION INTERNAL FIXATION (ORIF) TIBIAL PLATEAU;  Surgeon: [name], MD;  Location: [place];  Service:  Orthopedics;  Laterality: Right;   PARATHYROIDECTOMY N/A 10/26/2022   Procedure: total PARATHYROIDECTOMY;  Surgeon: [name], MD;  Location: [place];  Service: General;  Laterality: N/A;   PARATHYROIDECTOMY Right 10/26/2022   Procedure: AUTOTRANSPLANT TO RIGHT FOREARM;  Surgeon: [name], MD;  Location: [place] OR;  Service: General;  Laterality: Right;    Short Social History:  Social History   Tobacco Use   Smoking status: Former    Types: Cigarettes    Passive exposure: Past   Smokeless tobacco: Never  Substance Use Topics   Alcohol use: Not Currently    No Known Allergies  Current Outpatient Medications  Medication Sig Dispense Refill   acetaminophen (TYLENOL) 500 MG tablet Take 1,000 mg by mouth every 6 (six) hours as needed for moderate pain or headache.     AgaMatrix Ultra-Thin Lancets MISC Check blood glucose twice daily before meals. 100 each 11   amLODipine (NORVASC) 10 MG tablet Take 1 tablet (10 mg total) by mouth daily. 90 tablet 3   aspirin-acetaminophen-caffeine (EXCEDRIN MIGRAINE) 250-250-65 MG tablet Take 1-2 tablets by mouth every 6 (six) hours as needed for headache.     Blood Glucose Monitoring Suppl (AGAMATRIX PRESTO) w/Device KIT Check sugars twice daily 1 kit 0   Blood Glucose Monitoring Suppl (TRUE METRIX METER) w/Device KIT Use as directed 1 kit 0   calcitRIOL (ROCALTROL) 0.5 MCG capsule Take 6 capsules (3 mcg total) by mouth 2 (two) times daily. 60 capsule 4   calcium carbonate (TUMS - DOSED IN MG ELEMENTAL CALCIUM) 500 MG chewable tablet Chew 5 tablets (1,000 mg of elemental calcium total) by mouth 4 (four) times daily. 120 tablet 2   carvedilol (COREG) 12.5 MG tablet Take 1 tablet (12.5 mg total) by mouth 2 (two) times daily. 180 tablet 3   ezetimibe (ZETIA) 10 MG tablet Take 1 tablet (10 mg total) by mouth daily. 90 tablet 3   glipiZIDE (GLUCOTROL) 10 MG tablet Take 10 mg by mouth daily before breakfast.     glucose blood (TRUE METRIX BLOOD GLUCOSE TEST)  test strip Use as instructed 100 each 12   rosuvastatin (CRESTOR) 40 MG tablet Take 1 tablet (40 mg total) by mouth daily. 90 tablet 3   traMADol (ULTRAM) 50 MG tablet Take 1 tablet (50 mg total) by mouth every 6 (six) hours as needed for moderate pain (mild pain). 20 tablet 0   TRUEplus Lancets 28G MISC Use as directed 100 each 4   No current facility-administered medications for this visit.    Review of Systems  Constitutional:  Constitutional negative. HENT: HENT negative.  Eyes: Eyes negative.  Respiratory: Respiratory negative.  Cardiovascular: Positive for leg swelling.  GI: Gastrointestinal negative.  Musculoskeletal: Positive for leg pain and joint pain.  Skin: Skin negative.  Neurological: Neurological negative. Hematologic: Hematologic/lymphatic negative.  Psychiatric: Psychiatric negative.        Objective:  Objective   Vitals:   11/04/22 1216  BP: (!) 159/105  Pulse: 62  Resp: 20  Temp: 97.9 F (36.6 C)  SpO2: 95%  Weight: 138 lb (62.6 kg)  Height: 5\' 2"  (1.575 m)   Body mass index is 25.24 kg/m.  Physical Exam HENT:     Head: Normocephalic.     Nose: Nose normal.  Eyes:     Pupils: Pupils are equal, round, and reactive to light.  Neck:     Comments: Healing midline neck incision Cardiovascular:     Rate and Rhythm: Normal rate.     Pulses: Normal pulses.  Pulmonary:     Effort: Pulmonary effort is normal.  Abdominal:     General: Abdomen is flat.     Palpations: Abdomen is soft.  Musculoskeletal:        General: Normal range of motion.     Right lower leg: No edema.     Left lower leg: No edema.     Comments: Strong thrill left upper arm AV fistula Healing incision right forearm  Skin:    Capillary Refill: Capillary refill takes less than 2 seconds.  Neurological:     General: No focal deficit present.     Mental Status: He is alert.  Psychiatric:        Mood and Affect: Mood normal.        Thought Content: Thought content normal.         Judgment: Judgment normal.     Data: ABI Findings:  +---------+------------------+-----+---------+--------+  Right   Rt Pressure (mmHg)IndexWaveform Comment   +---------+------------------+-----+---------+--------+  Brachial 200                                       +---------+------------------+-----+---------+--------+  PTA     255               1.27 triphasic          +---------+------------------+-----+---------+--------+  DP      255               1.27 triphasic          +---------+------------------+-----+---------+--------+  [place]108               0.54                    +---------+------------------+-----+---------+--------+   +---------+------------------+-----+---------+-------+  Left    Lt Pressure (mmHg)IndexWaveform Comment  +---------+------------------+-----+---------+-------+  Brachial                                 AVF      +---------+------------------+-----+---------+-------+  PTA     255               1.27 triphasic         +---------+------------------+-----+---------+-------+  DP      255               1.27 triphasic         +---------+------------------+-----+---------+-------+  Theodoro [place]               0.56                   +---------+------------------+-----+---------+-------+   +-------+-----------+-----------+------------+------------+  ABI/TBIToday's ABIToday's TBIPrevious ABIPrevious TBI  +-------+-----------+-----------+------------+------------+  Right [place]         0.54       [place]          0.60          +-------+-----------+-----------+------------+------------+  Left  [place]         0.57       [place]          0.52          +-------+-----------+-----------+------------+------------+       Arterial wall calcification precludes accurate ankle pressures and ABIs.    Summary:  Right:   Elevated systolic pressures of A999333 mmHg in the right brachial preclude  accurate  ankle-brachial indices. PTA and DPA waveforms are triphasic.  Right TBI is 0.54.   Left:   Elevated systolic pressures of A999333 mmHg in the right brachial preclude  accurate ankle-brachial indices. PTA and DPA waveforms are triphasic.  Right TBI is 0.57.       Assessment/Plan:    52 year old male with history of trauma to the right lower extremity with pain that is likely secondary to trauma with preserved pulses bilaterally and normal triphasic ABIs.  He can see me on an as-needed basis.     Waynetta Sandy MD [facility]

## 2022-11-05 ENCOUNTER — Encounter: Payer: Self-pay | Admitting: Vascular Surgery

## 2022-11-05 ENCOUNTER — Encounter (HOSPITAL_COMMUNITY): Payer: Self-pay

## 2022-11-30 ENCOUNTER — Other Ambulatory Visit (HOSPITAL_BASED_OUTPATIENT_CLINIC_OR_DEPARTMENT_OTHER): Payer: Self-pay

## 2022-12-07 ENCOUNTER — Encounter (HOSPITAL_COMMUNITY): Payer: Self-pay

## 2023-05-07 ENCOUNTER — Other Ambulatory Visit: Payer: Self-pay | Admitting: Family

## 2023-05-07 DIAGNOSIS — Z1211 Encounter for screening for malignant neoplasm of colon: Secondary | ICD-10-CM

## 2023-05-07 DIAGNOSIS — Z1212 Encounter for screening for malignant neoplasm of rectum: Secondary | ICD-10-CM

## 2023-07-20 IMAGING — DX DG WRIST 2V*L*
1 series · 3 of 3 positions shown · non-contrast
Comparison: Preoperative x-rays of the left wrist, right knee and
left ankle dated 11/17/2021.

CLINICAL DATA: Left wrist, right knee and left ankle post ORIF
films.

EXAM:
LEFT WRIST - 2 VIEW; LEFT ANKLE COMPLETE - 3+ VIEW; PORTABLE RIGHT
KNEE - 1-2 VIEW

[Series 1: wrist · 0.14mm/px · 3 of 3 slices shown]
[im 1/3]
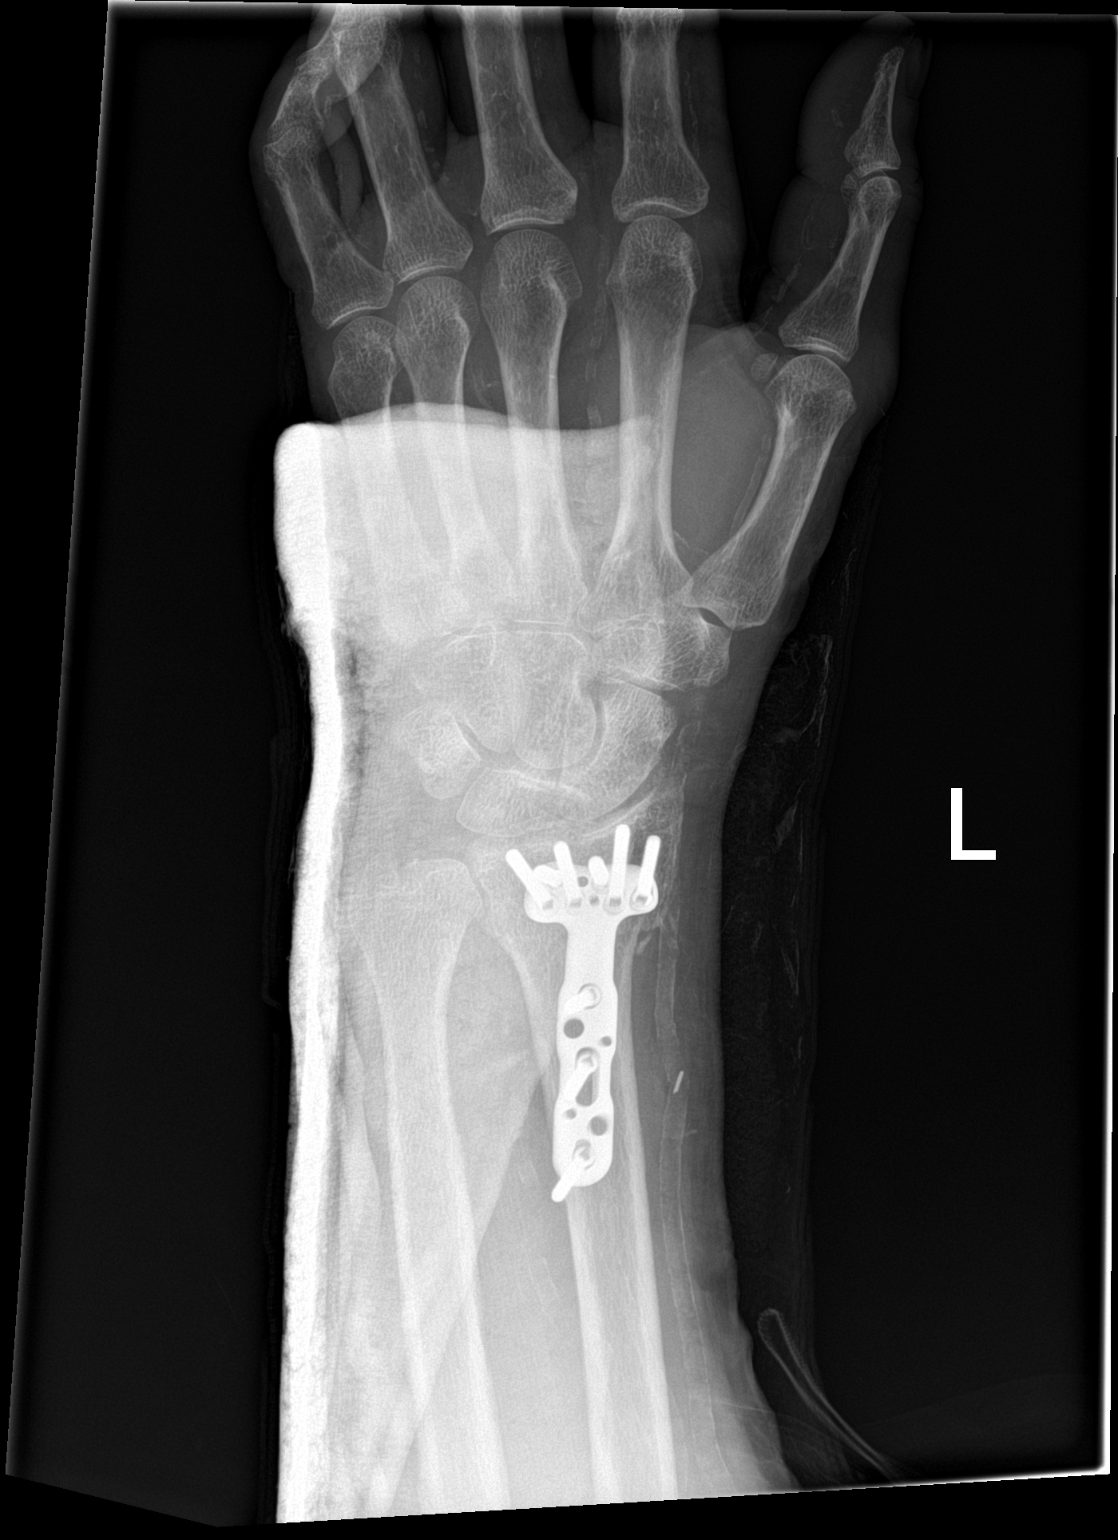
[im 2/3]
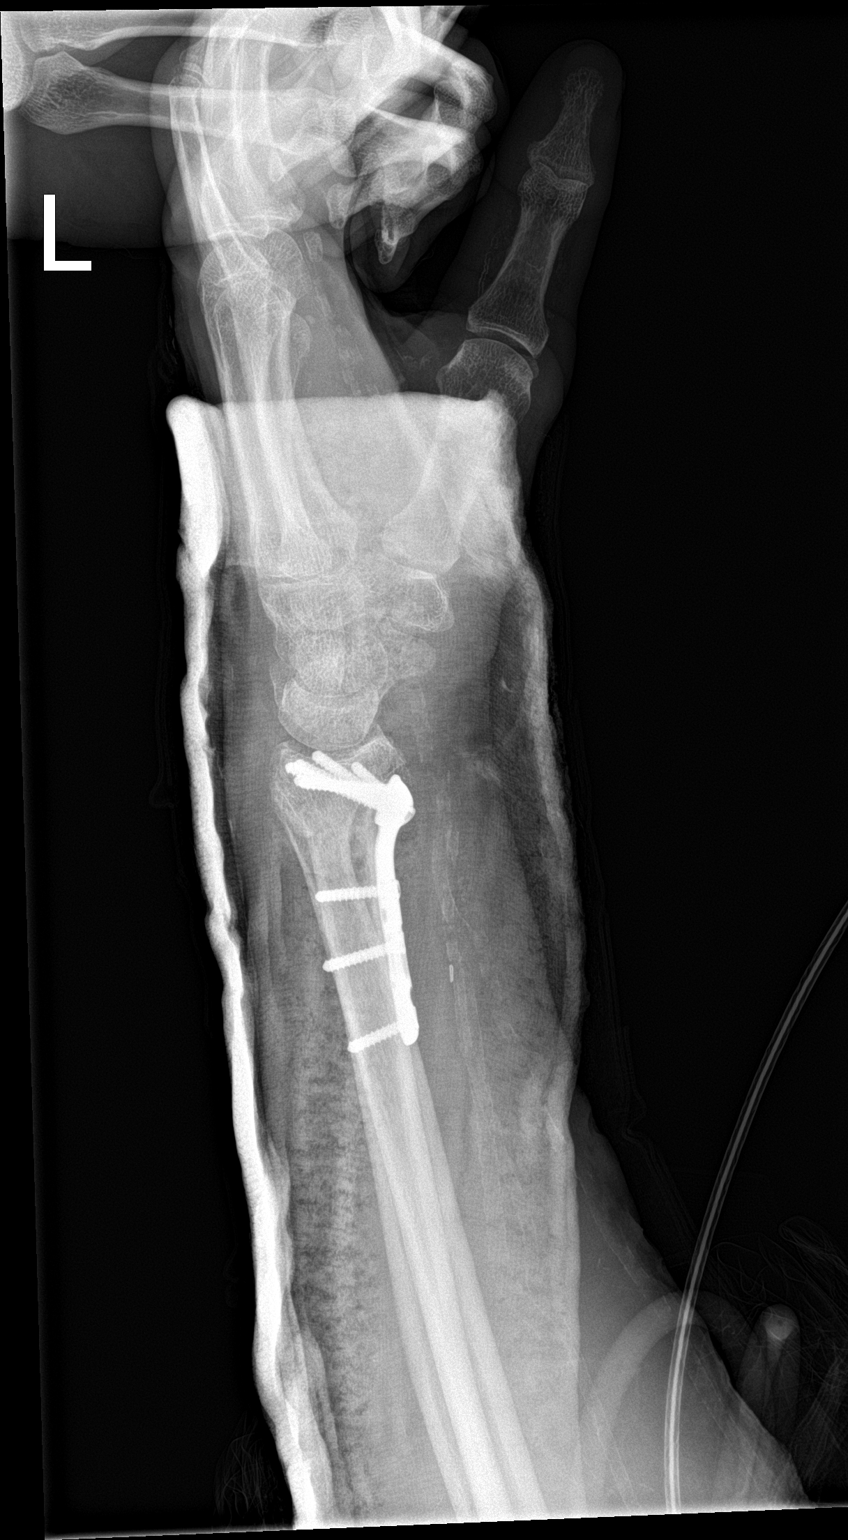
[im 3/3]
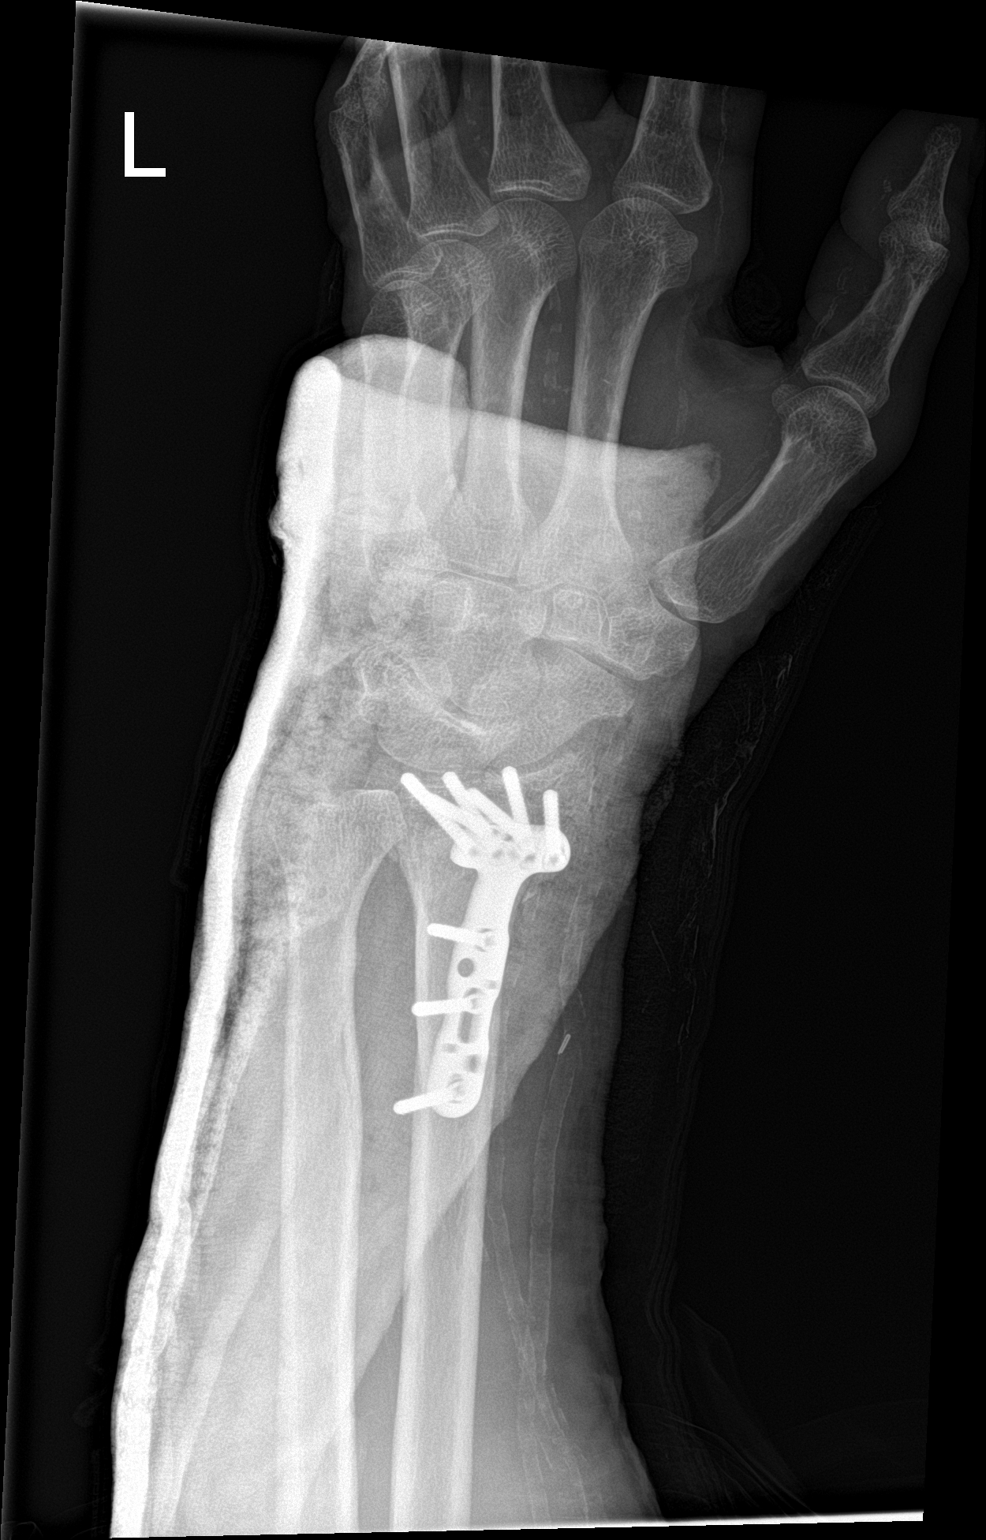

[3 of 3 positions shown; findings below may reference images not displayed]

FINDINGS: Left wrist: AP, lateral and oblique views in a cast are provided.

There has been ventral ORIF plate fixation based against the ventral
distal radial shaft and metaphysis, with multiple short securing
screws.

Post ORIF fracture alignment approximates anatomic. No new fracture
has become apparent.

A slightly laterally displaced ulnar styloid fracture is again shown
and unchanged.

There are extensive vascular calcifications in the forearm, wrist
and hand, including with calcifications extending into the digital
arteries. Generalized soft tissue swelling appears similar.

Right knee: Routine four views are provided.

There is an air-fluid level in the suprapatellar bursal space. There
is scattered linear subcutaneous soft tissue gas laterally and a
lateral sideplate ORIF with threaded securing screws against the
lateral aspect of the proximal tibial metaphysis and diametaphysis.

Post ORIF fracture alignment approximates anatomic. Joint spaces are
maintained.

No new fracture has become apparent. There are extensive vascular
calcifications.

Left ankle: AP, lateral and oblique views are provided in plaster
casting.

Two threaded screws enter obliquely from anteriorly into the distal
tibial metaphysis, with near anatomic post fracture fixation
alignment but with again noted mild widening of the lateral mortise
and mild narrowing of the medial mortise.

Lateral malleolar fracture with mild lateral displacement and
angulation continues to be seen and did not undergo ORIF.

No new fracture has become apparent. Generalized soft tissue
swelling appears similar. Extensive vascular calcifications.
IMPRESSION: Postoperative changes of left wrist, right knee and left ankle as
above. No acute hardware complication is seen. Lateral malleolar
fracture did not undergo ORIF. Extensive vascular calcifications.

## 2023-09-09 ENCOUNTER — Other Ambulatory Visit (HOSPITAL_COMMUNITY): Payer: Self-pay

## 2023-11-15 ENCOUNTER — Other Ambulatory Visit (HOSPITAL_COMMUNITY): Payer: Self-pay | Admitting: Nephrology

## 2023-11-15 DIAGNOSIS — N186 End stage renal disease: Secondary | ICD-10-CM

## 2023-11-18 ENCOUNTER — Other Ambulatory Visit: Payer: Self-pay | Admitting: Interventional Radiology

## 2023-11-19 ENCOUNTER — Other Ambulatory Visit: Payer: Self-pay | Admitting: Student

## 2023-11-21 NOTE — H&P (Incomplete)
 Chief Complaint: Low flow during dialysis. Request is for fistulogram with possible intervention.   Referring Physician(s): Upton,Elizabeth  Supervising Physician: Marland Silvas  Patient Status: [facility] - Out-pt  History of Present Illness: Blake Santana is a 53 y.o. male Spanish speaking.  outpatient. History of  HTN, HLD, DM, ESRD on HD via left upper arm brachiocephalic fistula place on 6.19.19. Prior left radiocephalic AV fistula placed on 1.6.10 failed. IR performed a fistulogram on 9.12.22. reads Normally patent left upper arm brachiocephalic AV fistula. Mild stable irregularity of the proximal fistula without significant stenosis. Stable competing venous outflow via branch veins in the upper arm into the deep venous system. Team is requesting a fistulogram due to low flow during dialysis.   No recent imaging. Venous doppler fro 4.3.24 reads Right:  Elevated systolic pressures of 200 mmHg in the right brachial preclude  accurate ankle-brachial indices. PTA and DPA waveforms are triphasic. Right TBI is 0.54  Left:  Elevated systolic pressures of 200 mmHg in the right brachial preclude  accurate ankle-brachial indices. PTA and DPA waveforms are triphasic.  Right TBI is 0.57.   Currently without any significant complaints. Patient alert and laying in bed,calm. Denies any fevers, headache, chest pain, SOB, cough, abdominal pain, nausea, vomiting or bleeding.   *** All labs and medications are within acceptable parameters. NKDA. Patient has been NPO since midnight.   Return precautions and treatment recommendations and follow-up discussed with the patient *** who is agreeable with the plan.     Past Medical History:  Diagnosis Date   Acute respiratory failure with hypoxia (HCC) 09/07/2018   Anemia of chronic kidney failure 03/11/2017   Cataracts, bilateral 2019   Chest pain 09/01/2018   minimal luminal irregularities   Chronic diastolic CHF (congestive heart  failure) (HCC)    Chronic kidney disease 03/11/2017   Dialysis Tu/Th/Sa   COVID 2022   hospitalized   Diabetes mellitus without complication (HCC) 1999   Edema 01/14/2017   Headache    Hypercholesterolemia    Hypertension 03/11/2017   Hypertensive retinopathy of both eyes 2019   Iron  deficiency anemia 06/18/2016   Pneumonia 09/07/2018   Proliferative diabetic retinopathy with macular edema (HCC) 2019   Retinal hemorrhage, bilateral and left vitreous hemorrhage.  Being treated with Lucentis.  Dr. Lenon Radar   RSV (respiratory syncytial virus infection) 08/2017    Past Surgical History:  Procedure Laterality Date   A/V FISTULAGRAM Left 01/19/2018   Procedure: A/V FISTULAGRAM - Left AV;  Surgeon: Adine Hoof, MD;  Location: [facility] INVASIVE CV LAB;  Service: Cardiovascular;  Laterality: Left;   AV FISTULA PLACEMENT Left 08/09/2017   Procedure: LEFT RADIOCEPHALIC ARTERIOVENOUS (AV) FISTULA CREATION;  Surgeon: Mayo Speck, MD;  Location: [facility] OR;  Service: Vascular;  Laterality: Left;   AV FISTULA PLACEMENT Left 02/02/2018   Procedure: ARTERIOVENOUS (AV) FISTULA CREATION LEFT UPPER EXTREMITY;  Surgeon: Mayo Speck, MD;  Location: [facility] OR;  Service: Vascular;  Laterality: Left;   INSERTION OF DIALYSIS CATHETER  08/09/2017   INSERTION OF DIALYSIS CATHETER Right 08/09/2017   Procedure: INSERTION OF DIALYSIS CATHETER RIGHT INTERNAL JUGULAR;  Surgeon: Mayo Speck, MD;  Location: [facility] OR;  Service: Vascular;  Laterality: Right;   IR DIALY SHUNT INTRO NEEDLE/INTRACATH INITIAL W/IMG LEFT Left 07/10/2020   IR DIALY SHUNT INTRO NEEDLE/INTRACATH INITIAL W/IMG LEFT Left 04/14/2021   IR REMOVAL TUN CV CATH W/O FL  07/01/2018   LEFT HEART CATH AND CORONARY ANGIOGRAPHY N/A 09/05/2018  Procedure: LEFT HEART CATH AND CORONARY ANGIOGRAPHY;  Surgeon: Lucendia Rusk, MD;  Location: [facility] INVASIVE CV LAB;  Service: Cardiovascular;  Laterality: N/A;   OPEN REDUCTION INTERNAL FIXATION (ORIF) DISTAL RADIAL  FRACTURE Left 11/20/2021   Procedure: POSSIBLE OPEN REDUCTION INTERNAL FIXATION (ORIF) DISTAL RADIAL FRACTURE;  Surgeon: Laneta Pintos, MD;  Location: [facility] OR;  Service: Orthopedics;  Laterality: Left;   ORIF ANKLE FRACTURE Left 11/20/2021   Procedure: OPEN REDUCTION INTERNAL FIXATION (ORIF) ANKLE PILON FRACTURE;  Surgeon: Laneta Pintos, MD;  Location: [facility] OR;  Service: Orthopedics;  Laterality: Left;   ORIF TIBIA PLATEAU Right 11/20/2021   Procedure: OPEN REDUCTION INTERNAL FIXATION (ORIF) TIBIAL PLATEAU;  Surgeon: Laneta Pintos, MD;  Location: [facility] OR;  Service: Orthopedics;  Laterality: Right;   PARATHYROIDECTOMY N/A 10/26/2022   Procedure: total PARATHYROIDECTOMY;  Surgeon: Oralee Billow, MD;  Location: [facility] OR;  Service: General;  Laterality: N/A;   PARATHYROIDECTOMY Right 10/26/2022   Procedure: AUTOTRANSPLANT TO RIGHT FOREARM;  Surgeon: Oralee Billow, MD;  Location: [facility] OR;  Service: General;  Laterality: Right;    Allergies: Patient has no known allergies.  Medications: Prior to Admission medications   Medication Sig Start Date End Date Taking? Authorizing Provider  acetaminophen  (TYLENOL ) 500 MG tablet Take 1,000 mg by mouth every 6 (six) hours as needed for moderate pain or headache.    [provider]  AgaMatrix Ultra-Thin Lancets MISC Check blood glucose twice daily before meals. 06/09/21   Jerrlyn Morel, NP  amLODipine  (NORVASC ) 10 MG tablet Take 1 tablet (10 mg total) by mouth daily. 04/13/22   Jacqueline Matsu, MD  aspirin -acetaminophen -caffeine (EXCEDRIN MIGRAINE) 250-250-65 MG tablet Take 1-2 tablets by mouth every 6 (six) hours as needed for headache.    [provider]  Blood Glucose Monitoring Suppl (AGAMATRIX PRESTO) w/Device KIT Check sugars twice daily 06/04/16   Ronalee Cocking, MD  Blood Glucose Monitoring Suppl (TRUE METRIX METER) w/Device KIT Use as directed 12/10/21   Senaida Dama, NP  calcitRIOL  (ROCALTROL ) 0.5 MCG capsule Take 6 capsules (3 mcg  total) by mouth 2 (two) times daily. 10/31/22   Candyce Champagne, MD  calcium  carbonate (TUMS - DOSED IN MG ELEMENTAL CALCIUM ) 500 MG chewable tablet Chew 5 tablets (1,000 mg of elemental calcium  total) by mouth 4 (four) times daily. 10/31/22   Candyce Champagne, MD  carvedilol  (COREG ) 12.5 MG tablet Take 1 tablet (12.5 mg total) by mouth 2 (two) times daily. 04/13/22   Jacqueline Matsu, MD  ezetimibe  (ZETIA ) 10 MG tablet Take 1 tablet (10 mg total) by mouth daily. 04/14/22   Jacqueline Matsu, MD  glipiZIDE  (GLUCOTROL ) 10 MG tablet Take 10 mg by mouth daily before breakfast.    [provider]  glucose blood (TRUE METRIX BLOOD GLUCOSE TEST) test strip Use as instructed 12/10/21   Senaida Dama, NP  rosuvastatin  (CRESTOR ) 40 MG tablet Take 1 tablet (40 mg total) by mouth daily. 02/25/22   Jacqueline Matsu, MD  traMADol  (ULTRAM ) 50 MG tablet Take 1 tablet (50 mg total) by mouth every 6 (six) hours as needed for moderate pain (mild pain). 10/31/22   Candyce Champagne, MD  TRUEplus Lancets 28G MISC Use as directed 12/10/21   Senaida Dama, NP     Family History  Problem Relation Age of Onset   Diabetes Mother    Kidney disease Mother        renal failure--diabetes, cause of death   Hypertension Mother  Diabetes Father    Kidney disease Father        Cause of death:  kidney failure form diabetes   Alcohol abuse Father    Diabetes Sister     Social History   Socioeconomic History   Marital status: Married    Spouse name: Marvelyn Slim   Number of children: 1   Years of education: 9   Highest education level: Not on file  Occupational History   Occupation: Education administrator  Tobacco Use   Smoking status: Former    Types: Cigarettes    Passive exposure: Past   Smokeless tobacco: Never  Vaping Use   Vaping status: Never Used  Substance and Sexual Activity   Alcohol use: Not Currently   Drug use: No   Sexual activity: Not on file  Other Topics Concern   Not on file  Social History Narrative    Originally from [place]   Came to [place]. In 1999   Lives with his girlfriend and their son.   Social Drivers of Corporate investment banker Strain: Not on file  Food Insecurity: No Food Insecurity (10/26/2022)   Hunger Vital Sign    Worried About Running Out of Food in the Last Year: Never true    Ran Out of Food in the Last Year: Never true  Transportation Needs: No Transportation Needs (10/26/2022)   PRAPARE - Administrator, Civil Service (Medical): No    Lack of Transportation (Non-Medical): No  Physical Activity: Not on file  Stress: Not on file  Social Connections: Not on file    ECOG Status: {CHL ONC ECOG MV:7846962952}  Review of Systems: A 12 point ROS discussed and pertinent positives are indicated in the HPI above.  All other systems are negative.  Review of Systems  Vital Signs: There were no vitals taken for this visit.    Physical Exam  Imaging: No results found.  Labs:  CBC: No results for input(s): "WBC", "HGB", "HCT", "PLT" in the last 8760 hours.  COAGS: No results for input(s): "INR", "APTT" in the last 8760 hours.  BMP: No results for input(s): "NA", "K", "CL", "CO2", "GLUCOSE", "BUN", "CALCIUM ", "CREATININE", "GFRNONAA", "GFRAA" in the last 8760 hours.  Invalid input(s): "CMP"  LIVER FUNCTION TESTS: No results for input(s): "BILITOT", "AST", "ALT", "ALKPHOS", "PROT", "ALBUMIN" in the last 8760 hours.   Assessment and Plan:  53 y.o. Spanish Speaking. male outpatient. History of  HTN, HLD, DM, ESRD on HD via left upper arm brachiocephalic  fistula place on 6.19.19. Prior left radiocephalic AV fistula placed on 8.4.13 failed. IR performed a fistulogram on 9.12.22. reads Normally patent left upper arm brachiocephalic AV fistula. Mild stable irregularity of the proximal fistula without significant stenosis. Stable competing venous outflow via branch veins in the upper arm into the deep venous system. Team is requesting a fistulogram due to  low flow during dialysis.   PLAN: IR Image Guided Fistulogram with possible intervention   Risks and benefits discussed with the patient including, but not limited to bleeding, infection, vascular injury, pulmonary embolism, need for tunneled HD catheter placement or even death.  All of the patient's questions were answered, patient is agreeable to proceed. Consent signed and in chart.   Thank you for this interesting consult.  I greatly enjoyed meeting Blake Santana and look forward to participating in their care.  A copy of this report was sent to the requesting provider on this date.  Electronically Signed: Marceil Sensor, NP  11/21/2023, 6:54 PM   I spent a total of {New INPT:304952001} {New Out-Pt:304952002}  {Established Out-Pt:304952003} in face to face in clinical consultation, greater than 50% of which was counseling/coordinating care for ***

## 2023-11-22 ENCOUNTER — Encounter (HOSPITAL_COMMUNITY): Payer: Self-pay

## 2023-11-22 ENCOUNTER — Ambulatory Visit (HOSPITAL_COMMUNITY): Admission: RE | Admit: 2023-11-22 | Payer: Self-pay | Source: Ambulatory Visit

## 2024-02-29 ENCOUNTER — Other Ambulatory Visit: Payer: Self-pay

## 2024-02-29 ENCOUNTER — Ambulatory Visit: Payer: Self-pay

## 2024-02-29 ENCOUNTER — Encounter (HOSPITAL_COMMUNITY): Payer: Self-pay

## 2024-02-29 ENCOUNTER — Telehealth: Payer: Self-pay

## 2024-02-29 VITALS — BP 152/83 | HR 104 | Temp 98.1°F | Resp 16 | Ht 62.0 in | Wt 145.0 lb

## 2024-02-29 DIAGNOSIS — Z7689 Persons encountering health services in other specified circumstances: Secondary | ICD-10-CM

## 2024-02-29 DIAGNOSIS — E1129 Type 2 diabetes mellitus with other diabetic kidney complication: Secondary | ICD-10-CM

## 2024-02-29 DIAGNOSIS — Z992 Dependence on renal dialysis: Secondary | ICD-10-CM

## 2024-02-29 DIAGNOSIS — Z7984 Long term (current) use of oral hypoglycemic drugs: Secondary | ICD-10-CM

## 2024-02-29 DIAGNOSIS — N186 End stage renal disease: Secondary | ICD-10-CM

## 2024-02-29 DIAGNOSIS — I152 Hypertension secondary to endocrine disorders: Secondary | ICD-10-CM

## 2024-02-29 LAB — POCT GLYCOSYLATED HEMOGLOBIN (HGB A1C): Hemoglobin A1C: 10.2 % — AB (ref 4.0–5.6)

## 2024-02-29 MED ORDER — METFORMIN HCL 1000 MG PO TABS
1000.0000 mg | ORAL_TABLET | Freq: Two times a day (BID) | ORAL | 3 refills | Status: DC
  Filled 2024-02-29: qty 180, 90d supply, fill #0

## 2024-02-29 MED ORDER — LANTHANUM CARBONATE 1000 MG PO CHEW
1000.0000 mg | CHEWABLE_TABLET | Freq: Two times a day (BID) | ORAL | 3 refills | Status: AC
  Filled 2024-02-29: qty 180, 90d supply, fill #0

## 2024-02-29 MED ORDER — GLIPIZIDE 10 MG PO TABS
10.0000 mg | ORAL_TABLET | Freq: Every day | ORAL | 3 refills | Status: DC
  Filled 2024-02-29: qty 90, 90d supply, fill #0

## 2024-02-29 NOTE — Progress Notes (Signed)
 Patient ID: Blake Santana, male    DOB: [birth year]  MRN: [id number]  CC: Annual Exam (-Patient is here to have annually  complete physical examination /-Care gap address/-labs taken )   Subjective: Blake Santana is a 53 y.o. male with past medical history of ESRD on dialysis, Type 2 diabetes, who presents to clinic for annual exam. No acute concerns at this time. Needs refills on some of his medications.    Patient Active Problem List   Diagnosis Date Noted   Non-English speaking patient 10/31/2022   Chronic diastolic CHF (congestive heart failure) (HCC) 02/20/2022   Fall on and from ladder, initial encounter 11/27/2021   Closed fracture of inferior pubic ramus, left, initial encounter (HCC) 11/17/2021   Fracture of distal end of tibia with fibula, left, closed, initial encounter 11/17/2021   Distal radius fracture, left 11/17/2021   Posterior dislocation of left elbow 11/17/2021   Injury resulting from fall from height 11/17/2021   Closed fracture of proximal end of right tibia 11/17/2021   Disp fx of proximal phalanx of right lesser toe(s), init 11/17/2021   Pain of joint of left ankle and foot 06/10/2021   Pruritus, unspecified 04/15/2021   Other disorders of phosphorus metabolism 12/09/2020   Personal history of COVID-19 04/05/2020   Pneumonia due to COVID-19 virus 03/30/2020   COVID-19 03/30/2020   Sepsis (HCC) 03/30/2020   Cough 03/26/2020   Allergy, unspecified, initial encounter 02/29/2020   Anaphylactic shock, unspecified, initial encounter 02/29/2020   Noncompliance 02/24/2020   Diarrhea, unspecified 03/15/2019   TB (tuberculosis) contact 11/27/2018   COVID-19 virus detected    Hypoxia    Abnormal EKG    Volume overload 09/02/2018   Pleural effusion, bilateral 09/02/2018   Atypical chest pain 09/01/2018   Acute respiratory failure with hypoxia (HCC) 09/01/2018   Community acquired pneumonia    Elevated troponin    Fever, unspecified  02/02/2018   Pain, unspecified 02/02/2018   Dependence on renal dialysis (HCC) 09/21/2017   Underimmunization status 09/07/2017   Hyperkalemia 08/31/2017   Complication of vascular dialysis catheter 08/18/2017   Shortness of breath 08/18/2017   Hyperlipidemia, unspecified 08/13/2017   Coagulation defect, unspecified (HCC) 08/13/2017   Encounter for screening for other viral diseases 08/13/2017   Heart failure, unspecified (HCC) 08/13/2017   Secondary hyperparathyroidism of renal origin (HCC) 08/13/2017   Unspecified protein-calorie malnutrition (HCC) 08/11/2017   RSV (respiratory syncytial virus infection) 08/09/2017   ESRD on hemodialysis TTS (HCC) 08/08/2017   Proliferative diabetic retinopathy with macular edema (HCC) 08/03/2017   Hypertensive retinopathy of both eyes 08/03/2017   Cataracts, bilateral 08/03/2017   Acute exacerbation of CHF (congestive heart failure) (HCC) 04/03/2017   End stage renal disease (HCC) 03/11/2017   Hypertension associated with diabetes (HCC) 03/11/2017   Hypertensive chronic kidney disease with stage 1 through stage 4 chronic kidney disease, or unspecified chronic kidney disease 03/11/2017   Edema 01/14/2017   Dyslipidemia 10/15/2016   Diabetic retinopathy (HCC) 10/15/2016   Proteinuria, unspecified 06/18/2016   Anemia in chronic kidney disease 06/18/2016   Iron  deficiency anemia, unspecified 06/18/2016   Type 2 diabetes mellitus with other diabetic kidney complication (HCC) 10/20/2001     No Known Allergies  ROS: Review of Systems Negative except as stated above  PHYSICAL EXAM: BP (!) 152/83   Pulse (!) 104   Temp 98.1 F (36.7 C) (Oral)   Resp 16   Ht 5' 2 (1.575 m)   Wt 145 lb (65.8 kg)  SpO2 93%   BMI 26.52 kg/m   Physical Exam  General: well-appearing, no acute distress Skin: no jaundice, rashes, or lesions Cardiovascular: regular heart rate and rhythm, normal S1/S2, no murmurs, gallops, or rubs, peripheral pulses 2+  bilaterally Chest: no skeletal deformity, lungs clear to auscultation bilaterally, equal breath sounds bilaterally Abdomen: soft, non-distended, non-tender to palpation, no hepatomegaly, no splenomegaly, normoactive bowel sounds Musculoskeletal: normal gait Extremities: no peripheral edema   ASSESSMENT AND PLAN:  1. Type 2 diabetes mellitus with other diabetic kidney complication (HCC) (Primary) - (Hb A1C) today is 10.2. Not a goal. Was 6.6 on 03/24 - Continue glipiZIDE  (GLUCOTROL ) 10 MG tablet; Take 1 tablet (10 mg total) by mouth daily before breakfast.  Dispense: 90 tablet; Refill: 3 - Start metFORMIN  (GLUCOPHAGE ) 1000 MG tablet; Take 1 tablet (1,000 mg total) by mouth 2 (two) times daily with a meal.  Dispense: 180 tablet; Refill: 3  2. ESRD on hemodialysis (HCC) - Pt had difficulty obtaining medication listed below due to cost. Reordering to community pharmacy. - Per Nephrology recommendations ordering lanthanum  (FOSRENOL ) 1000 MG chewable tablet; Chew 1 tablet (1,000 mg total) by mouth 2 (two) times daily with a meal.  Dispense: 180 tablet; Refill: 3 - Being followed by Nephrology, has hemodialysis M,W, F  3. Hypertension associated with diabetes (HCC) - Continue amlodipine  10mg  tablet once daily  4. Encounter to establish care with new provider Routine labs ordered today, will complete physical next visit - CBC - Comprehensive metabolic panel - Lipid panel   Patient was given the opportunity to ask questions.  Patient verbalized understanding of the plan and was able to repeat key elements of the plan.    Orders Placed This Encounter  Procedures   CBC   Comprehensive metabolic panel   Lipid panel   POCT glycosylated hemoglobin (Hb A1C)     Requested Prescriptions   Signed Prescriptions Disp Refills   lanthanum  (FOSRENOL ) 1000 MG chewable tablet 180 tablet 3    Sig: Chew 1 tablet (1,000 mg total) by mouth 2 (two) times daily with a meal.   glipiZIDE  (GLUCOTROL ) 10  MG tablet 90 tablet 3    Sig: Take 1 tablet (10 mg total) by mouth daily before breakfast.   metFORMIN  (GLUCOPHAGE ) 1000 MG tablet 180 tablet 3    Sig: Take 1 tablet (1,000 mg total) by mouth 2 (two) times daily with a meal.    Return in about 1 month (around 03/31/2024) for physical.  Sula Leavy Rode, PA-C

## 2024-02-29 NOTE — Telephone Encounter (Signed)
 A document form from [employer] has been faxed: Patient Assistance Application FOSRENOL  , to be filled out by provider. Send document back via email within 7-days. Document is located in providers tray at front office.         [email or web address]

## 2024-03-01 ENCOUNTER — Other Ambulatory Visit: Payer: Self-pay

## 2024-03-01 LAB — CBC
Hematocrit: 39.8 % (ref 37.5–51.0)
Hemoglobin: 13.1 g/dL (ref 13.0–17.7)
MCH: 32.4 pg (ref 26.6–33.0)
MCHC: 32.9 g/dL (ref 31.5–35.7)
MCV: 99 fL — ABNORMAL HIGH (ref 79–97)
Platelets: 209 x10E3/uL (ref 150–450)
RBC: 4.04 x10E6/uL — ABNORMAL LOW (ref 4.14–5.80)
RDW: 13.7 % (ref 11.6–15.4)
WBC: 8.7 x10E3/uL (ref 3.4–10.8)

## 2024-03-01 LAB — COMPREHENSIVE METABOLIC PANEL WITH GFR
ALT: 6 IU/L (ref 0–44)
AST: 10 IU/L (ref 0–40)
Albumin: 4.3 g/dL (ref 3.8–4.9)
Alkaline Phosphatase: 99 IU/L (ref 44–121)
BUN/Creatinine Ratio: 7 — ABNORMAL LOW (ref 9–20)
BUN: 55 mg/dL — ABNORMAL HIGH (ref 6–24)
Bilirubin Total: 0.3 mg/dL (ref 0.0–1.2)
CO2: 23 mmol/L (ref 20–29)
Calcium: 7 mg/dL — ABNORMAL LOW (ref 8.7–10.2)
Chloride: 87 mmol/L — ABNORMAL LOW (ref 96–106)
Creatinine, Ser: 8.04 mg/dL — ABNORMAL HIGH (ref 0.76–1.27)
Globulin, Total: 3.2 g/dL (ref 1.5–4.5)
Glucose: 243 mg/dL — ABNORMAL HIGH (ref 70–99)
Potassium: 5.8 mmol/L — ABNORMAL HIGH (ref 3.5–5.2)
Sodium: 134 mmol/L (ref 134–144)
Total Protein: 7.5 g/dL (ref 6.0–8.5)
eGFR: 7 mL/min/1.73 — ABNORMAL LOW (ref >59)

## 2024-03-01 LAB — LIPID PANEL
Chol/HDL Ratio: 8.3 ratio — ABNORMAL HIGH (ref 0.0–5.0)
Cholesterol, Total: 248 mg/dL — ABNORMAL HIGH (ref 100–199)
HDL: 30 mg/dL — ABNORMAL LOW (ref >39)
LDL Chol Calc (NIH): 174 mg/dL — ABNORMAL HIGH (ref 0–99)
Triglycerides: 233 mg/dL — ABNORMAL HIGH (ref 0–149)
VLDL Cholesterol Cal: 44 mg/dL — ABNORMAL HIGH (ref 5–40)

## 2024-03-02 ENCOUNTER — Other Ambulatory Visit: Payer: Self-pay

## 2024-03-02 ENCOUNTER — Ambulatory Visit: Payer: Self-pay

## 2024-03-02 ENCOUNTER — Encounter (HOSPITAL_COMMUNITY): Payer: Self-pay

## 2024-03-02 MED ORDER — ROSUVASTATIN CALCIUM 10 MG PO TABS
10.0000 mg | ORAL_TABLET | Freq: Every day | ORAL | 3 refills | Status: DC
  Filled 2024-03-02: qty 60, 60d supply, fill #0

## 2024-03-02 NOTE — Progress Notes (Signed)
 Your cholesterol levels are high again, I have ordered rosuvastatin  10mg  once daily to help lower those numbers. It is important for heart and vascular health. Please pick up prescription from pharmacy at earliest convenience.  Your kidney health is being followed by the kidney specialist, make sure not to miss your dialysis appointments.

## 2024-03-06 ENCOUNTER — Other Ambulatory Visit: Payer: Self-pay

## 2024-03-07 ENCOUNTER — Other Ambulatory Visit: Payer: Self-pay

## 2024-03-09 ENCOUNTER — Other Ambulatory Visit: Payer: Self-pay

## 2024-03-27 ENCOUNTER — Other Ambulatory Visit: Payer: Self-pay

## 2024-04-04 ENCOUNTER — Other Ambulatory Visit: Payer: Self-pay

## 2024-04-06 ENCOUNTER — Other Ambulatory Visit: Payer: Self-pay

## 2024-07-24 ENCOUNTER — Other Ambulatory Visit: Payer: Self-pay

## 2024-08-06 ENCOUNTER — Inpatient Hospital Stay (HOSPITAL_COMMUNITY)
Admission: EM | Admit: 2024-08-06 | Discharge: 2024-08-14 | DRG: 064 | Disposition: A | Discharge status: Rehabilitation Facility | Payer: MEDICAID

## 2024-08-06 ENCOUNTER — Other Ambulatory Visit: Payer: Self-pay

## 2024-08-06 ENCOUNTER — Encounter (HOSPITAL_COMMUNITY): Payer: Self-pay

## 2024-08-06 ENCOUNTER — Emergency Department (HOSPITAL_COMMUNITY): Payer: Self-pay

## 2024-08-06 DIAGNOSIS — Z87891 Personal history of nicotine dependence: Secondary | ICD-10-CM

## 2024-08-06 DIAGNOSIS — Z79899 Other long term (current) drug therapy: Secondary | ICD-10-CM

## 2024-08-06 DIAGNOSIS — Z8616 Personal history of COVID-19: Secondary | ICD-10-CM

## 2024-08-06 DIAGNOSIS — Z8249 Family history of ischemic heart disease and other diseases of the circulatory system: Secondary | ICD-10-CM

## 2024-08-06 DIAGNOSIS — I63412 Cerebral infarction due to embolism of left middle cerebral artery: Principal | ICD-10-CM | POA: Diagnosis present

## 2024-08-06 DIAGNOSIS — I5032 Chronic diastolic (congestive) heart failure: Secondary | ICD-10-CM | POA: Diagnosis present

## 2024-08-06 DIAGNOSIS — I132 Hypertensive heart and chronic kidney disease with heart failure and with stage 5 chronic kidney disease, or end stage renal disease: Secondary | ICD-10-CM | POA: Diagnosis present

## 2024-08-06 DIAGNOSIS — E875 Hyperkalemia: Secondary | ICD-10-CM | POA: Diagnosis present

## 2024-08-06 DIAGNOSIS — Z7982 Long term (current) use of aspirin: Secondary | ICD-10-CM

## 2024-08-06 DIAGNOSIS — E1122 Type 2 diabetes mellitus with diabetic chronic kidney disease: Secondary | ICD-10-CM | POA: Diagnosis present

## 2024-08-06 DIAGNOSIS — Z992 Dependence on renal dialysis: Secondary | ICD-10-CM

## 2024-08-06 DIAGNOSIS — Z91148 Patient's other noncompliance with medication regimen for other reason: Secondary | ICD-10-CM

## 2024-08-06 DIAGNOSIS — Z833 Family history of diabetes mellitus: Secondary | ICD-10-CM

## 2024-08-06 DIAGNOSIS — I639 Cerebral infarction, unspecified: Secondary | ICD-10-CM | POA: Diagnosis present

## 2024-08-06 DIAGNOSIS — Z603 Acculturation difficulty: Secondary | ICD-10-CM | POA: Diagnosis present

## 2024-08-06 DIAGNOSIS — I63512 Cerebral infarction due to unspecified occlusion or stenosis of left middle cerebral artery: Secondary | ICD-10-CM

## 2024-08-06 DIAGNOSIS — E7800 Pure hypercholesterolemia, unspecified: Secondary | ICD-10-CM | POA: Diagnosis present

## 2024-08-06 DIAGNOSIS — H538 Other visual disturbances: Secondary | ICD-10-CM | POA: Diagnosis present

## 2024-08-06 DIAGNOSIS — R7989 Other specified abnormal findings of blood chemistry: Secondary | ICD-10-CM | POA: Diagnosis present

## 2024-08-06 DIAGNOSIS — E1129 Type 2 diabetes mellitus with other diabetic kidney complication: Secondary | ICD-10-CM | POA: Diagnosis present

## 2024-08-06 DIAGNOSIS — R29810 Facial weakness: Secondary | ICD-10-CM | POA: Diagnosis present

## 2024-08-06 DIAGNOSIS — Z811 Family history of alcohol abuse and dependence: Secondary | ICD-10-CM

## 2024-08-06 DIAGNOSIS — R4701 Aphasia: Secondary | ICD-10-CM | POA: Diagnosis present

## 2024-08-06 DIAGNOSIS — I161 Hypertensive emergency: Principal | ICD-10-CM | POA: Diagnosis present

## 2024-08-06 DIAGNOSIS — N186 End stage renal disease: Secondary | ICD-10-CM | POA: Diagnosis present

## 2024-08-06 DIAGNOSIS — R29707 NIHSS score 7: Secondary | ICD-10-CM | POA: Diagnosis present

## 2024-08-06 DIAGNOSIS — D631 Anemia in chronic kidney disease: Secondary | ICD-10-CM | POA: Diagnosis present

## 2024-08-06 DIAGNOSIS — Z7984 Long term (current) use of oral hypoglycemic drugs: Secondary | ICD-10-CM

## 2024-08-06 DIAGNOSIS — Z8419 Family history of other disorders of kidney and ureter: Secondary | ICD-10-CM

## 2024-08-06 DIAGNOSIS — H35033 Hypertensive retinopathy, bilateral: Secondary | ICD-10-CM | POA: Diagnosis present

## 2024-08-06 LAB — COMPREHENSIVE METABOLIC PANEL WITH GFR
ALT: 7 U/L (ref 0–44)
AST: 13 U/L — ABNORMAL LOW (ref 15–41)
Albumin: 4.1 g/dL (ref 3.5–5.0)
Alkaline Phosphatase: 80 U/L (ref 38–126)
Anion gap: 16 — ABNORMAL HIGH (ref 5–15)
BUN: 50 mg/dL — ABNORMAL HIGH (ref 6–20)
CO2: 24 mmol/L (ref 22–32)
Calcium: 8.2 mg/dL — ABNORMAL LOW (ref 8.9–10.3)
Chloride: 94 mmol/L — ABNORMAL LOW (ref 98–111)
Creatinine, Ser: 10.6 mg/dL — ABNORMAL HIGH (ref 0.61–1.24)
GFR, Estimated: 5 mL/min — ABNORMAL LOW
Glucose, Bld: 171 mg/dL — ABNORMAL HIGH (ref 70–99)
Potassium: 6.4 mmol/L (ref 3.5–5.1)
Sodium: 134 mmol/L — ABNORMAL LOW (ref 135–145)
Total Bilirubin: 0.3 mg/dL (ref 0.0–1.2)
Total Protein: 7.6 g/dL (ref 6.5–8.1)

## 2024-08-06 LAB — DIFFERENTIAL
Abs Immature Granulocytes: 0.03 K/uL (ref 0.00–0.07)
Basophils Absolute: 0.1 K/uL (ref 0.0–0.1)
Basophils Relative: 1 %
Eosinophils Absolute: 0.5 K/uL (ref 0.0–0.5)
Eosinophils Relative: 6 %
Immature Granulocytes: 0 %
Lymphocytes Relative: 18 %
Lymphs Abs: 1.6 K/uL (ref 0.7–4.0)
Monocytes Absolute: 0.7 K/uL (ref 0.1–1.0)
Monocytes Relative: 8 %
Neutro Abs: 5.9 K/uL (ref 1.7–7.7)
Neutrophils Relative %: 67 %

## 2024-08-06 LAB — CBG MONITORING, ED
Glucose-Capillary: 168 mg/dL — ABNORMAL HIGH (ref 70–99)
Glucose-Capillary: 109 mg/dL — ABNORMAL HIGH (ref 70–99)

## 2024-08-06 LAB — I-STAT CHEM 8, ED
BUN: 47 mg/dL — ABNORMAL HIGH (ref 6–20)
Calcium, Ion: 0.95 mmol/L — ABNORMAL LOW (ref 1.15–1.40)
Chloride: 98 mmol/L (ref 98–111)
Creatinine, Ser: 11.7 mg/dL — ABNORMAL HIGH (ref 0.61–1.24)
Glucose, Bld: 165 mg/dL — ABNORMAL HIGH (ref 70–99)
HCT: 38 % — ABNORMAL LOW (ref 39.0–52.0)
Hemoglobin: 12.9 g/dL — ABNORMAL LOW (ref 13.0–17.0)
Potassium: 6.2 mmol/L — ABNORMAL HIGH (ref 3.5–5.1)
Sodium: 135 mmol/L (ref 135–145)
TCO2: 25 mmol/L (ref 22–32)

## 2024-08-06 LAB — CBC
HCT: 37.3 % — ABNORMAL LOW (ref 39.0–52.0)
Hemoglobin: 12.1 g/dL — ABNORMAL LOW (ref 13.0–17.0)
MCH: 31.8 pg (ref 26.0–34.0)
MCHC: 32.4 g/dL (ref 30.0–36.0)
MCV: 97.9 fL (ref 80.0–100.0)
Platelets: 162 K/uL (ref 150–400)
RBC: 3.81 MIL/uL — ABNORMAL LOW (ref 4.22–5.81)
RDW: 13.2 % (ref 11.5–15.5)
WBC: 8.8 K/uL (ref 4.0–10.5)
nRBC: 0 % (ref 0.0–0.2)

## 2024-08-06 LAB — PROTIME-INR
INR: 1 (ref 0.8–1.2)
Prothrombin Time: 13.9 s (ref 11.4–15.2)

## 2024-08-06 LAB — ETHANOL: Alcohol, Ethyl (B): <15 mg/dL

## 2024-08-06 LAB — APTT: aPTT: 30 s (ref 24–36)

## 2024-08-06 LAB — TROPONIN T, HIGH SENSITIVITY: Troponin T High Sensitivity: 45 ng/L — ABNORMAL HIGH (ref 0–19)

## 2024-08-06 MED ORDER — HYDRALAZINE HCL 20 MG/ML IJ SOLN
10.0000 mg | Freq: Once | INTRAMUSCULAR | Status: AC
  Administered 2024-08-06: 10 mg via INTRAVENOUS
  Filled 2024-08-06: qty 1

## 2024-08-06 MED ORDER — INSULIN ASPART 100 UNIT/ML IV SOLN
5.0000 [IU] | Freq: Once | INTRAVENOUS | Status: AC
  Administered 2024-08-06: 5 [IU] via INTRAVENOUS
  Filled 2024-08-06: qty 5

## 2024-08-06 MED ORDER — SODIUM CHLORIDE 0.9% FLUSH
3.0000 mL | Freq: Once | INTRAVENOUS | Status: AC
  Administered 2024-08-06: 3 mL via INTRAVENOUS

## 2024-08-06 MED ORDER — LABETALOL HCL 5 MG/ML IV SOLN
10.0000 mg | Freq: Once | INTRAVENOUS | Status: AC
  Administered 2024-08-06: 10 mg via INTRAVENOUS
  Filled 2024-08-06: qty 4

## 2024-08-06 MED ORDER — ALBUTEROL SULFATE (2.5 MG/3ML) 0.083% IN NEBU
10.0000 mg | INHALATION_SOLUTION | Freq: Once | RESPIRATORY_TRACT | Status: AC
  Administered 2024-08-06: 10 mg via RESPIRATORY_TRACT
  Filled 2024-08-06: qty 12

## 2024-08-06 MED ORDER — DEXTROSE 50 % IV SOLN
1.0000 | Freq: Once | INTRAVENOUS | Status: AC
  Administered 2024-08-06: 50 mL via INTRAVENOUS
  Filled 2024-08-06: qty 50

## 2024-08-06 MED ORDER — LORAZEPAM 2 MG/ML IJ SOLN
1.0000 mg | Freq: Once | INTRAMUSCULAR | Status: AC
  Administered 2024-08-06: 1 mg via INTRAVENOUS
  Filled 2024-08-06: qty 1

## 2024-08-06 MED ORDER — SODIUM ZIRCONIUM CYCLOSILICATE 10 G PO PACK
10.0000 g | PACK | Freq: Once | ORAL | Status: AC
  Administered 2024-08-06: 10 g via ORAL
  Filled 2024-08-06: qty 1

## 2024-08-06 MED ORDER — SODIUM BICARBONATE 8.4 % IV SOLN
50.0000 meq | Freq: Once | INTRAVENOUS | Status: AC
  Administered 2024-08-06: 50 meq via INTRAVENOUS
  Filled 2024-08-06: qty 50

## 2024-08-06 MED ORDER — CALCIUM GLUCONATE 10 % IV SOLN
1.0000 g | Freq: Once | INTRAVENOUS | Status: AC
  Administered 2024-08-06: 1 g via INTRAVENOUS
  Filled 2024-08-06: qty 10

## 2024-08-06 NOTE — ED Provider Triage Note (Signed)
 Emergency Medicine Provider Triage Evaluation Note  Blake Santana , a 54 y.o. male  was evaluated in triage.  Patient's family reports patient started to have difficulty with words last night at 10 PM, and symptoms seem to worsen at 1 PM today.  Both of these timeframes outside of code stroke window.  Patient also reports left-sided facial numbness and mild chest pain.  Review of Systems  Positive: As above Negative: As above  Physical Exam  BP (!) 231/94 (BP Location: Right Arm)   Pulse 72   Temp 98.3 F (36.8 C)   Resp 19   SpO2 95%  Gen:   Awake, no distress   Resp:  Normal effort  MSK:   Moves extremities without difficulty  Other:  Expressive aphasia  Physical exam and history taking complicated by use of interpreter patient's difficulty with words right now  Medical Decision Making  Medically screening exam initiated at 5:38 PM.  Appropriate orders placed.  Blake Santana was informed that the remainder of the evaluation will be completed by another provider, this initial triage assessment does not replace that evaluation, and the importance of remaining in the ED until their evaluation is complete.  Patient with a positive aphasia and left-sided facial numbness.  No drift in the upper or lower extremities, no appreciable weakness in the upper or lower extremities.  Symptoms started at 10 PM last night seem to worsen at 1 PM today.  Patient outside code stroke window, but activated workup for possible CVA with CT head and labs.   Veta Palma, PA-C 08/06/24 1738

## 2024-08-06 NOTE — ED Notes (Signed)
 CCMD Called

## 2024-08-06 NOTE — Consult Note (Incomplete)
 Renal Service Consult Note [facility] Blake JONETTA Fret, MD  Patient: Blake Santana Date: 08/06/2024 Requesting Physician: Dr. Ula  Reason for Consult: ESRD pt CVA and hyperkalemia HPI: The patient is a 54 y.o. year-old w/ PMH as below who presented to ED today complaining of trouble speaking and choking when he was eating dinner last night.  Then around noon today he began having slurred speech and numbness on the left side of his face.  No extremity involvement. In ED BP 231/ 94, HR 72, RR 18, 100% on RA. Labs showed Na 134, K 6.4, bun 50, creat 10.6, Ca 8.2, alb 4.1, lft's ok. WBC 8K. Hb 12.  Head CT showed focal cortical and subcortical hypodensity at the left temporoparietal lobe suspicious for acute or subacute infarct. MRI showed acute left posterior MCA territory infarct involving the left anterior temporal lobe, overlying parietal lobe and posterior left insula.  Some edema without mass effect.      Pt seen in HD unit. Is able to understand questions but seems to be having problems responding. May be his new stroke w/ aphasia. Denies any CP or SOb at this time.    ROS - n/a   Past Medical History  Past Medical History:  Diagnosis Date   Acute respiratory failure with hypoxia (HCC) 09/07/2018   Anemia of chronic kidney failure 03/11/2017   Cataracts, bilateral 2019   Chest pain 09/01/2018   minimal luminal irregularities   Chronic diastolic CHF (congestive heart failure) (HCC)    Chronic kidney disease 03/11/2017   Dialysis Tu/Th/Sa   COVID 2022   hospitalized   Diabetes mellitus without complication (HCC) 1999   Edema 01/14/2017   Headache    Hypercholesterolemia    Hypertension 03/11/2017   Hypertensive retinopathy of both eyes 2019   Iron  deficiency anemia 06/18/2016   Pneumonia 09/07/2018   Proliferative diabetic retinopathy with macular edema (HCC) 2019   Retinal hemorrhage, bilateral and left vitreous hemorrhage.  Being treated with  Lucentis.  Dr. Raj   RSV (respiratory syncytial virus infection) 08/2017   Past Surgical History  Past Surgical History:  Procedure Laterality Date   A/V FISTULAGRAM Left 01/19/2018   Procedure: A/V FISTULAGRAM - Left AV;  Surgeon: Sheree Penne Bruckner, MD;  Location: [facility] INVASIVE CV LAB;  Service: Cardiovascular;  Laterality: Left;   AV FISTULA PLACEMENT Left 08/09/2017   Procedure: LEFT RADIOCEPHALIC ARTERIOVENOUS (AV) FISTULA CREATION;  Surgeon: Oris Krystal FALCON, MD;  Location: [facility] OR;  Service: Vascular;  Laterality: Left;   AV FISTULA PLACEMENT Left 02/02/2018   Procedure: ARTERIOVENOUS (AV) FISTULA CREATION LEFT UPPER EXTREMITY;  Surgeon: Oris Krystal FALCON, MD;  Location: [facility] OR;  Service: Vascular;  Laterality: Left;   INSERTION OF DIALYSIS CATHETER  08/09/2017   INSERTION OF DIALYSIS CATHETER Right 08/09/2017   Procedure: INSERTION OF DIALYSIS CATHETER RIGHT INTERNAL JUGULAR;  Surgeon: Oris Krystal FALCON, MD;  Location: [facility] OR;  Service: Vascular;  Laterality: Right;   IR DIALY SHUNT INTRO NEEDLE/INTRACATH INITIAL W/IMG LEFT Left 07/10/2020   IR DIALY SHUNT INTRO NEEDLE/INTRACATH INITIAL W/IMG LEFT Left 04/14/2021   IR REMOVAL TUN CV CATH W/O FL  07/01/2018   LEFT HEART CATH AND CORONARY ANGIOGRAPHY N/A 09/05/2018   Procedure: LEFT HEART CATH AND CORONARY ANGIOGRAPHY;  Surgeon: Dann Candyce RAMAN, MD;  Location: [facility] INVASIVE CV LAB;  Service: Cardiovascular;  Laterality: N/A;   OPEN REDUCTION INTERNAL FIXATION (ORIF) DISTAL RADIAL FRACTURE Left 11/20/2021   Procedure: POSSIBLE OPEN REDUCTION INTERNAL FIXATION (ORIF)  DISTAL RADIAL FRACTURE;  Surgeon: Kendal Franky SQUIBB, MD;  Location: [facility] OR;  Service: Orthopedics;  Laterality: Left;   ORIF ANKLE FRACTURE Left 11/20/2021   Procedure: OPEN REDUCTION INTERNAL FIXATION (ORIF) ANKLE PILON FRACTURE;  Surgeon: Kendal Franky SQUIBB, MD;  Location: [facility] OR;  Service: Orthopedics;  Laterality: Left;   ORIF TIBIA PLATEAU Right 11/20/2021   Procedure: OPEN REDUCTION INTERNAL  FIXATION (ORIF) TIBIAL PLATEAU;  Surgeon: Kendal Franky SQUIBB, MD;  Location: [facility] OR;  Service: Orthopedics;  Laterality: Right;   PARATHYROIDECTOMY N/A 10/26/2022   Procedure: total PARATHYROIDECTOMY;  Surgeon: Eletha Boas, MD;  Location: [facility] OR;  Service: General;  Laterality: N/A;   PARATHYROIDECTOMY Right 10/26/2022   Procedure: AUTOTRANSPLANT TO RIGHT FOREARM;  Surgeon: Eletha Boas, MD;  Location: [facility] OR;  Service: General;  Laterality: Right;   Family History  Family History  Problem Relation Age of Onset   Diabetes Mother    Kidney disease Mother        renal failure--diabetes, cause of death   Hypertension Mother    Diabetes Father    Kidney disease Father        Cause of death:  kidney failure form diabetes   Alcohol abuse Father    Diabetes Sister    Social History  reports that he has quit smoking. His smoking use included cigarettes. He has been exposed to tobacco smoke. He has never used smokeless tobacco. He reports that he does not currently use alcohol. He reports that he does not use drugs. Allergies Allergies[1] Home medications Prior to Admission medications  Medication Sig Start Date End Date Taking? Authorizing Provider  acetaminophen  (TYLENOL ) 500 MG tablet Take 1,000 mg by mouth every 6 (six) hours as needed for moderate pain or headache.    [provider]  AgaMatrix Ultra-Thin Lancets MISC Check blood glucose twice daily before meals. 06/09/21   Oley Bascom RAMAN, NP  amLODipine  (NORVASC ) 10 MG tablet Take 1 tablet (10 mg total) by mouth daily. 04/13/22   Shlomo Wilbert SAUNDERS, MD  aspirin -acetaminophen -caffeine (EXCEDRIN MIGRAINE) 250-250-65 MG tablet Take 1-2 tablets by mouth every 6 (six) hours as needed for headache.    [provider]  Blood Glucose Monitoring Suppl (AGAMATRIX PRESTO) w/Device KIT Check sugars twice daily 06/04/16   Adella Norris, MD  Blood Glucose Monitoring Suppl (TRUE METRIX METER) w/Device KIT Use as directed 12/10/21   Jaycee Greig PARAS, NP  calcitRIOL  (ROCALTROL ) 0.5 MCG capsule Take 6 capsules (3 mcg total) by mouth 2 (two) times daily. 10/31/22   Sheldon Standing, MD  calcium  carbonate (TUMS - DOSED IN MG ELEMENTAL CALCIUM ) 500 MG chewable tablet Chew 5 tablets (1,000 mg of elemental calcium  total) by mouth 4 (four) times daily. 10/31/22   Sheldon Standing, MD  carvedilol  (COREG ) 12.5 MG tablet Take 1 tablet (12.5 mg total) by mouth 2 (two) times daily. 04/13/22   Shlomo Wilbert SAUNDERS, MD  ezetimibe  (ZETIA ) 10 MG tablet Take 1 tablet (10 mg total) by mouth daily. Patient not taking: Reported on 02/29/2024 04/14/22   Shlomo Wilbert SAUNDERS, MD  glipiZIDE  (GLUCOTROL ) 10 MG tablet Take 1 tablet (10 mg total) by mouth daily before breakfast. 02/29/24   Leavy Rode, Sula, PA-C  glucose blood (TRUE METRIX BLOOD GLUCOSE TEST) test strip Use as instructed 12/10/21   Jaycee Greig PARAS, NP  lanthanum  (FOSRENOL ) 1000 MG chewable tablet Chew 1 tablet (1,000 mg total) by mouth 2 (two) times daily with a meal. 02/29/24   Leavy Rode Sula, PA-C  metFORMIN  (GLUCOPHAGE ) 1000 MG tablet Take 1 tablet (1,000 mg total) by mouth 2 (two) times daily with a meal. 02/29/24   Leavy Rode, Sula, PA-C  rosuvastatin  (CRESTOR ) 10 MG tablet Take 1 tablet (10 mg total) by mouth daily. 03/02/24   Tapia Cedeno, Amor, PA-C  traMADol  (ULTRAM ) 50 MG tablet Take 1 tablet (50 mg total) by mouth every 6 (six) hours as needed for moderate pain (mild pain). 10/31/22   Sheldon Standing, MD  TRUEplus Lancets 28G MISC Use as directed 12/10/21   Jaycee Greig PARAS, NP     Vitals:   08/06/24 2022 08/06/24 2030 08/06/24 2047 08/06/24 2100  BP:  (!) 177/82 (!) 187/86 (!) 188/94  Pulse: 89 90 92 93  Resp: 18 17 18 16   Temp:      SpO2: 100% 100% 100% 95%   Exam Gen alert, no distress Sclera anicteric, throat clear  No jvd or bruits Chest clear bilat to bases RRR no MRG Abd soft ntnd no mass or ascites +bs Ext no LE or UE edema, no other edema Neuro is alert, Ox 3 , nf    LUA AVF+bruit   Home  bp meds: Norvasc  Coreg    OP HD: [facility] 3h B350 65.2kg  AVF  Hep 2000  Last OP HD 1/02, post wt 67.2kg   Assessment/ Plan:  # Hyperkalemia - K+ 6.4 in ED - pt rec'd full temporizing measures in ED + lokelma  10gm - HD in process w/ low K bath this am  # ESRD - on HD MWF - HD in process  # acute CVA  - L posterior MCA territory per MRI  # HTN - neuro requests permissive HTN for 1st 48 hrs - permissive HTN is keep SBP > 180 if possible, and under 220/110   # Volume - no vol excess on exam, on RA - CXR shows vasc congestion - UF goal 2.5- 3 L    # Anemia of esrd - Hb 12- 13 here, follow, no esa needs          Myer Fret  MD [facility] 08/06/2024, 10:32 PM  Recent Labs  Lab 08/06/24 1727 08/06/24 1746  HGB 12.1* 12.9*  ALBUMIN 4.1  --   CALCIUM  8.2*  --   CREATININE 10.60* 11.70*  K 6.4* 6.2*   Inpatient medications:         [1] No Known Allergies

## 2024-08-06 NOTE — ED Triage Notes (Signed)
 Spanish interpreter used: Pt's wife reports last night around 2000, when eating a sandwich, he began choking and had trouble speaking. Wife reports around 1100-1300 today, he began having slurred speech and numbness on the left side of his face. No focal weakness noted, pt is van negative. PT appears to have expressive aphasia.

## 2024-08-06 NOTE — ED Provider Notes (Signed)
 " [facility] Provider Note   CSN: [id number] Arrival date & time: 08/06/24  1718     Patient presents with: Aphasia and Numbness   Blake Santana is a 54 y.o. male.  The history is provided by the patient. The history is limited by a language barrier. A language interpreter was used.    Patient is a 54 year old male presenting ED today for concern with family bedside for concerns for aphasia, left-sided weakness to left face and left hand.  Noted to have had an episode of dysphagia last night with slurred speech at dinner that resolved on approximately 5 to 10 minutes.  Returned to baseline according to family who is at bedside.  On hemodialysis MWF, last dialysis on Friday.   Patiently notably struggling with aphasia, with family member at bedside providing history.  Reports that he had returned of symptoms at approximate 11:00 this afternoon with slurred speech, aphasia and right-sided numbness to face and left hand.    Family at bedside report that he has not taken any of his medications today.  Family deny fever, cough, congestion, vomiting, diarrhea, lower leg swelling.     Prior to Admission medications  Medication Sig Start Date End Date Taking? Authorizing Provider  acetaminophen  (TYLENOL ) 500 MG tablet Take 1,000 mg by mouth every 6 (six) hours as needed for moderate pain or headache.    [provider]  AgaMatrix Ultra-Thin Lancets MISC Check blood glucose twice daily before meals. 06/09/21   Oley Bascom RAMAN, NP  amLODipine  (NORVASC ) 10 MG tablet Take 1 tablet (10 mg total) by mouth daily. 04/13/22   Shlomo Wilbert SAUNDERS, MD  aspirin -acetaminophen -caffeine (EXCEDRIN MIGRAINE) 250-250-65 MG tablet Take 1-2 tablets by mouth every 6 (six) hours as needed for headache.    [provider]  Blood Glucose Monitoring Suppl (AGAMATRIX PRESTO) w/Device KIT Check sugars twice daily 06/04/16   Adella Norris, MD   Blood Glucose Monitoring Suppl (TRUE METRIX METER) w/Device KIT Use as directed 12/10/21   Jaycee Greig PARAS, NP  calcitRIOL  (ROCALTROL ) 0.5 MCG capsule Take 6 capsules (3 mcg total) by mouth 2 (two) times daily. 10/31/22   Sheldon Standing, MD  calcium  carbonate (TUMS - DOSED IN MG ELEMENTAL CALCIUM ) 500 MG chewable tablet Chew 5 tablets (1,000 mg of elemental calcium  total) by mouth 4 (four) times daily. 10/31/22   Sheldon Standing, MD  carvedilol  (COREG ) 12.5 MG tablet Take 1 tablet (12.5 mg total) by mouth 2 (two) times daily. 04/13/22   Shlomo Wilbert SAUNDERS, MD  ezetimibe  (ZETIA ) 10 MG tablet Take 1 tablet (10 mg total) by mouth daily. Patient not taking: Reported on 02/29/2024 04/14/22   Shlomo Wilbert SAUNDERS, MD  glipiZIDE  (GLUCOTROL ) 10 MG tablet Take 1 tablet (10 mg total) by mouth daily before breakfast. 02/29/24   Leavy Rode, Sula, PA-C  glucose blood (TRUE METRIX BLOOD GLUCOSE TEST) test strip Use as instructed 12/10/21   Jaycee Greig PARAS, NP  lanthanum  (FOSRENOL ) 1000 MG chewable tablet Chew 1 tablet (1,000 mg total) by mouth 2 (two) times daily with a meal. 02/29/24   Leavy Rode, Sula, PA-C  metFORMIN  (GLUCOPHAGE ) 1000 MG tablet Take 1 tablet (1,000 mg total) by mouth 2 (two) times daily with a meal. 02/29/24   Leavy Rode, Sula, PA-C  rosuvastatin  (CRESTOR ) 10 MG tablet Take 1 tablet (10 mg total) by mouth daily. 03/02/24   Leavy Rode Sula, PA-C  traMADol  (ULTRAM ) 50 MG tablet Take 1 tablet (50 mg  total) by mouth every 6 (six) hours as needed for moderate pain (mild pain). 10/31/22   Sheldon Standing, MD  TRUEplus Lancets 28G MISC Use as directed 12/10/21   Jaycee Greig PARAS, NP    Allergies: Patient has no known allergies.    Review of Systems  Neurological:  Positive for speech difficulty and weakness.  All other systems reviewed and are negative.   Updated Vital Signs BP (!) 165/80   Pulse 92   Temp 98.3 F (36.8 C) (Oral)   Resp 13   Ht 5' 2 (1.575 m)   Wt 66.2 kg   SpO2 93%   BMI 26.70 kg/m    Physical Exam Vitals and nursing note reviewed.  Constitutional:      General: He is not in acute distress.    Appearance: Normal appearance. He is not ill-appearing or diaphoretic.  HENT:     Head: Normocephalic and atraumatic.  Eyes:     General: No scleral icterus.       Right eye: No discharge.        Left eye: No discharge.     Extraocular Movements: Extraocular movements intact.     Conjunctiva/sclera: Conjunctivae normal.  Cardiovascular:     Rate and Rhythm: Normal rate and regular rhythm.     Pulses: Normal pulses.     Heart sounds: Normal heart sounds. No murmur heard.    No friction rub. No gallop.  Pulmonary:     Effort: Pulmonary effort is normal. No respiratory distress.     Breath sounds: No stridor. No wheezing, rhonchi or rales.  Chest:     Chest wall: No tenderness.  Abdominal:     General: Abdomen is flat. There is no distension.     Palpations: Abdomen is soft.     Tenderness: There is no abdominal tenderness. There is no right CVA tenderness, left CVA tenderness, guarding or rebound.  Musculoskeletal:        General: No swelling, deformity or signs of injury.     Cervical back: Normal range of motion. No rigidity.     Right lower leg: No edema.     Left lower leg: No edema.  Skin:    General: Skin is warm and dry.     Findings: No bruising, erythema or lesion.  Neurological:     General: No focal deficit present.     Mental Status: He is alert.     Sensory: Sensory deficit present.     Motor: No weakness.     Comments: Noted to have apraxia and aphasia  Unable to perform full neuroexam with patient possibly not understanding interpreter versus suffering from aphasia/apraxia.  Grossly moving all extremities, normal grip strength bilaterally.  Endorsing normal sensation to right side.  Additionally able to lift at hips bilaterally, noted to have 4 out of 5 strength on the left and 5 out of 5 strength on right.  Unable to perform further  strength/sensation testing with patient not following instructions.  Additionally unable to perform visual field testing with patient possibly not understanding instructions after repeated times versus aphasia/apraxia.   Psychiatric:        Mood and Affect: Mood normal.     (all labs ordered are listed, but only abnormal results are displayed) Labs Reviewed  CBC - Abnormal; Notable for the following components:      Result Value   RBC 3.81 (*)    Hemoglobin 12.1 (*)    HCT 37.3 (*)  All other components within normal limits  COMPREHENSIVE METABOLIC PANEL WITH GFR - Abnormal; Notable for the following components:   Sodium 134 (*)    Potassium 6.4 (*)    Chloride 94 (*)    Glucose, Bld 171 (*)    BUN 50 (*)    Creatinine, Ser 10.60 (*)    Calcium  8.2 (*)    AST 13 (*)    GFR, Estimated 5 (*)    Anion gap 16 (*)    All other components within normal limits  CBG MONITORING, ED - Abnormal; Notable for the following components:   Glucose-Capillary 168 (*)    All other components within normal limits  I-STAT CHEM 8, ED - Abnormal; Notable for the following components:   Potassium 6.2 (*)    BUN 47 (*)    Creatinine, Ser 11.70 (*)    Glucose, Bld 165 (*)    Calcium , Ion 0.95 (*)    Hemoglobin 12.9 (*)    HCT 38.0 (*)    All other components within normal limits  CBG MONITORING, ED - Abnormal; Notable for the following components:   Glucose-Capillary 109 (*)    All other components within normal limits  TROPONIN T, HIGH SENSITIVITY - Abnormal; Notable for the following components:   Troponin T High Sensitivity 45 (*)    All other components within normal limits  PROTIME-INR  APTT  DIFFERENTIAL  ETHANOL  TROPONIN T, HIGH SENSITIVITY    EKG: EKG Interpretation Date/Time:  Santana August 06 2024 17:55:07 EST Ventricular Rate:  77 PR Interval:  149 QRS Duration:  104 QT Interval:  422 QTC Calculation: 478 R Axis:   82  Text Interpretation: Sinus rhythm LVH with  secondary repolarization abnormality Inferior Q waves noted Confirmed by Ula Barter [phone]) on 08/06/2024 5:58:16 PM  Radiology: MR BRAIN WO CONTRAST Result Date: 08/06/2024 EXAM: MRI BRAIN WITHOUT CONTRAST 08/06/2024 09:36:57 PM TECHNIQUE: Multiplanar multisequence MRI of the head/brain was performed without the administration of intravenous contrast. COMPARISON: CT head earlier today. CLINICAL HISTORY: Neuro deficit, acute, stroke suspected FINDINGS: BRAIN AND VENTRICLES: Acute left posterior MCA territory infarct involving the left anterior temporal lobe, overlying parietal lobe and posterior left insula. Edema without mass effect. Numerous punctate foci of susceptibility artifact throughout the brain. No intracranial hemorrhage. No mass. No midline shift. No hydrocephalus. The sella is unremarkable. Normal flow voids. ORBITS: No acute abnormality. SINUSES AND MASTOIDS: No acute abnormality. BONES AND SOFT TISSUES: Normal marrow signal. No acute soft tissue abnormality. IMPRESSION: 1. Acute left posterior MCA territory infarct. 2. Numerous punctate foci of susceptibility artifact throughout the brain, possibly prior micro emboli. Electronically signed by: Gilmore Molt 08/06/2024 09:54 PM EST RP Workstation: [id number]   CT HEAD WO CONTRAST Result Date: 08/06/2024 CLINICAL DATA:  Altered aphasia EXAM: CT HEAD WITHOUT CONTRAST TECHNIQUE: Contiguous axial images were obtained from the base of the skull through the vertex without intravenous contrast. RADIATION DOSE REDUCTION: This exam was performed according to the departmental dose-optimization program which includes automated exposure control, adjustment of the mA and/or kV according to patient size and/or use of iterative reconstruction technique. COMPARISON:  CT brain 11/17/2021 FINDINGS: Brain: Focal cortical and subcortical hypodensity at the left temporoparietal lobe. No hemorrhage. No mass. No significant mass effect or midline shift. The ventricles  are nonenlarged Vascular: No hyperdense vessels. Carotid and vertebral calcification. Extensive small vessel calcification. Skull: No fracture Sinuses/Orbits: No acute finding. Other: None IMPRESSION: Focal cortical and subcortical hypodensity at the left temporoparietal lobe, suspicious  for acute to subacute infarct. No hemorrhage. No significant mass effect or midline shift. Electronically Signed   By: Luke Bun M.D.   On: 08/06/2024 18:09    .Critical Care  Performed by: Beola Terrall RAMAN, PA-C Authorized by: Beola Terrall RAMAN, PA-C   Critical care provider statement:    Critical care time (minutes):  45   Critical care was necessary to treat or prevent imminent or life-threatening deterioration of the following conditions:  Renal failure   Critical care was time spent personally by me on the following activities:  Development of treatment plan with patient or surrogate, discussions with consultants, evaluation of patient's response to treatment, examination of patient, ordering and review of laboratory studies, ordering and review of radiographic studies, ordering and performing treatments and interventions, pulse oximetry, re-evaluation of patient's condition, review of old charts and obtaining history from patient or surrogate   I assumed direction of critical care for this patient from another provider in my specialty: yes     Care discussed with: admitting provider      Medications Ordered in the ED  sodium chloride  flush (NS) 0.9 % injection 3 mL (3 mLs Intravenous Given 08/06/24 1821)  labetalol  (NORMODYNE ) injection 10 mg (10 mg Intravenous Given 08/06/24 1820)  LORazepam  (ATIVAN ) injection 1 mg (1 mg Intravenous Given 08/06/24 2059)  hydrALAZINE  (APRESOLINE ) injection 10 mg (10 mg Intravenous Given 08/06/24 1906)  insulin  aspart (novoLOG ) injection 5 Units (5 Units Intravenous Given 08/06/24 1903)    And  dextrose  50 % solution 50 mL (50 mLs Intravenous Given 08/06/24 1858)  sodium zirconium  cyclosilicate (LOKELMA ) packet 10 g (10 g Oral Given 08/06/24 2050)  albuterol  (PROVENTIL ) (2.5 MG/3ML) 0.083% nebulizer solution 10 mg (10 mg Nebulization Given 08/06/24 1913)  calcium  gluconate inj 10% (1 g) URGENT USE ONLY! (1 g Intravenous Given 08/06/24 1858)  sodium bicarbonate  injection 50 mEq (50 mEq Intravenous Given 08/06/24 1855)    Clinical Course as of 08/07/24 0008  Sun Aug 06, 2024  1900 Spoke with neurology, Dr. Matthews who Recommend to the patient blood pressure be brought below 220/110. [CB]    Clinical Course User Index [CB] Beola Terrall RAMAN, PA-C   Medical Decision Making Amount and/or Complexity of Data Reviewed Radiology: ordered.  Risk OTC drugs. Prescription drug management. Decision regarding hospitalization.   This patient is a 54 year old male who presents to the ED for concern of acute aphasia, apraxia, left hand numbness and right facial drooping that started acutely last night noted to have some dysphagia present at that time at approximately 8 PM.  Reportedly returning to baseline where he then had recurrent symptoms again at approximately 11 AM today. ESRD patient on dialysis, finishing his full dialysis last Friday.  On physical exam, patient is in no acute distress, afebrile, alert, nontachypneic, nontachycardic.  Notably does appear to be aphasic, answering questions differently.  Uncertain if interpreter or disease processes is the cause.  LCTAB.  Notably is able to move all extremities grossly.  Unable to evaluate visual fields as patient is not understanding questions.  Additionally noting no lower leg edema.  Patient conversation, suspecting likely stroke, presents at the window.  Spoke with Dr. Matthews with neurology who recommended MRI.  CT scan did show stroke.  Patient also noted to be in hypertensive emergency with systolic of 240.  Intended to give labetalol  without improvement patient's systolic still at 230.  Gave hydralazine  where patient's blood  pressure came down to 180.  States giving further  blood pressure medications.  Additionally noting patient is hyperkalemic, providing Lokelma , albuterol , bicarb, insulin , and spoke with Dr. Vassie with nephrology who said that he was scheduled for him for dialysis tomorrow morning.  Spoke with Dr. Khaliqdina with neurology who said that he was becoming to evaluate the patient.  Will see to admit patient with patient having left posterior MCA stroke additionally noting hyperkalemia.  Patient care transferred to Dr. Alfornia.   Differential diagnoses prior to evaluation: The emergent differential diagnosis includes, but is not limited to, hypertensive emergency, stroke, encephalopathy, metabolic disturbance. This is not an exhaustive differential.   Past Medical History / Co-morbidities / Social History: ESRD on dialysis, diabetes, HTN, anemia, CHF, HTN.  Additional history: Chart reviewed. Pertinent results include: Last PCP on 02/20/2023  Lab Tests/Imaging studies: I personally interpreted labs/imaging and the pertinent results include:    CBC does indicate anemia with hemoglobin 12.1 CMP notes hyperkalemia 6.4 Troponin elevated 45 Ethanol undetectable MRI brain shows left posterior  MCA stroke  CT head also noting focal cortical and subcortical hyperdensity at the left temporal parietal lobe.  I agree with the radiologist interpretation.  Cardiac monitoring: EKG obtained and interpreted by myself and attending physician which shows: Sinus rhythm with LVH  EKG Interpretation Date/Time:  Santana August 06 2024 17:55:07 EST Ventricular Rate:  77 PR Interval:  149 QRS Duration:  104 QT Interval:  422 QTC Calculation: 478 R Axis:   82  Text Interpretation: Sinus rhythm LVH with secondary repolarization abnormality Inferior Q waves noted Confirmed by Ula Barter [phone]) on 08/06/2024 5:58:16 PM          Medications: I ordered medication including lorazepam , hydralazine ,  labetalol , insulin , calcium  gluconate, dextrose , bicarb, Lokelma .  I have reviewed the patients home medicines and have made adjustments as needed.  Critical Interventions: Medications for hyperkalemia  Consultations: Spoke with nephrology, Dr. Geralynn Sermon with neurology, Dr. Matthews  Social Determinants of Health:  Spanish-speaking for interpretation  Disposition: After consideration of the diagnostic results and the patients response to treatment, I feel that the patient would benefit from admission, patient care transferred to Dr. Alfornia, pending evaluation by neurology Blake Santana  Final diagnoses:  Hypertensive emergency  Cerebrovascular accident (CVA) due to occlusion of left middle cerebral artery [facility])  Hyperkalemia    ED Discharge Orders     None          Beola Terrall GORMAN DEVONNA 08/07/24 0008    Ula Barter SAUNDERS, MD 08/07/24 2318  "

## 2024-08-07 ENCOUNTER — Inpatient Hospital Stay (HOSPITAL_COMMUNITY): Payer: MEDICAID

## 2024-08-07 ENCOUNTER — Other Ambulatory Visit: Payer: Self-pay

## 2024-08-07 DIAGNOSIS — N186 End stage renal disease: Secondary | ICD-10-CM

## 2024-08-07 DIAGNOSIS — I63412 Cerebral infarction due to embolism of left middle cerebral artery: Secondary | ICD-10-CM

## 2024-08-07 DIAGNOSIS — E785 Hyperlipidemia, unspecified: Secondary | ICD-10-CM

## 2024-08-07 DIAGNOSIS — E11319 Type 2 diabetes mellitus with unspecified diabetic retinopathy without macular edema: Secondary | ICD-10-CM

## 2024-08-07 DIAGNOSIS — I6389 Other cerebral infarction: Secondary | ICD-10-CM

## 2024-08-07 DIAGNOSIS — I161 Hypertensive emergency: Principal | ICD-10-CM

## 2024-08-07 DIAGNOSIS — R29707 NIHSS score 7: Secondary | ICD-10-CM

## 2024-08-07 DIAGNOSIS — E1122 Type 2 diabetes mellitus with diabetic chronic kidney disease: Secondary | ICD-10-CM

## 2024-08-07 DIAGNOSIS — Z992 Dependence on renal dialysis: Secondary | ICD-10-CM

## 2024-08-07 DIAGNOSIS — E1151 Type 2 diabetes mellitus with diabetic peripheral angiopathy without gangrene: Secondary | ICD-10-CM

## 2024-08-07 DIAGNOSIS — I120 Hypertensive chronic kidney disease with stage 5 chronic kidney disease or end stage renal disease: Secondary | ICD-10-CM

## 2024-08-07 DIAGNOSIS — I639 Cerebral infarction, unspecified: Secondary | ICD-10-CM

## 2024-08-07 LAB — ECHOCARDIOGRAM COMPLETE
AR max vel: 1.69 cm2
AV Area VTI: 1.6 cm2
AV Area mean vel: 1.47 cm2
AV Mean grad: 5 mmHg
AV Peak grad: 10 mmHg
Ao pk vel: 1.58 m/s
Calc EF: 60.4 %
Height: 62 in
S' Lateral: 3 cm
Single Plane A2C EF: 61.9 %
Single Plane A4C EF: 60.9 %
Weight: 2336 [oz_av]

## 2024-08-07 LAB — BASIC METABOLIC PANEL WITH GFR
Anion gap: 18 — ABNORMAL HIGH (ref 5–15)
BUN: 58 mg/dL — ABNORMAL HIGH (ref 6–20)
CO2: 22 mmol/L (ref 22–32)
Calcium: 7.9 mg/dL — ABNORMAL LOW (ref 8.9–10.3)
Chloride: 95 mmol/L — ABNORMAL LOW (ref 98–111)
Creatinine, Ser: 11.5 mg/dL — ABNORMAL HIGH (ref 0.61–1.24)
GFR, Estimated: 5 mL/min — ABNORMAL LOW
Glucose, Bld: 134 mg/dL — ABNORMAL HIGH (ref 70–99)
Potassium: 5.7 mmol/L — ABNORMAL HIGH (ref 3.5–5.1)
Sodium: 136 mmol/L (ref 135–145)

## 2024-08-07 LAB — GLUCOSE, CAPILLARY: Glucose-Capillary: 184 mg/dL — ABNORMAL HIGH (ref 70–99)

## 2024-08-07 LAB — CBG MONITORING, ED
Glucose-Capillary: 116 mg/dL — ABNORMAL HIGH (ref 70–99)
Glucose-Capillary: 165 mg/dL — ABNORMAL HIGH (ref 70–99)
Glucose-Capillary: 150 mg/dL — ABNORMAL HIGH (ref 70–99)

## 2024-08-07 LAB — LIPID PANEL
Cholesterol: 189 mg/dL (ref 0–200)
HDL: 33 mg/dL — ABNORMAL LOW
LDL Cholesterol: 122 mg/dL — ABNORMAL HIGH (ref 0–99)
Total CHOL/HDL Ratio: 5.7 ratio
Triglycerides: 169 mg/dL — ABNORMAL HIGH
VLDL: 34 mg/dL (ref 0–40)

## 2024-08-07 LAB — HEMOGLOBIN A1C
Hgb A1c MFr Bld: 8.5 % — ABNORMAL HIGH (ref 4.8–5.6)
Mean Plasma Glucose: 197.25 mg/dL

## 2024-08-07 LAB — TROPONIN T, HIGH SENSITIVITY: Troponin T High Sensitivity: 49 ng/L — ABNORMAL HIGH (ref 0–19)

## 2024-08-07 LAB — HIV ANTIBODY (ROUTINE TESTING W REFLEX): HIV Screen 4th Generation wRfx: REACTIVE — AB

## 2024-08-07 MED ORDER — HEPARIN SODIUM (PORCINE) 5000 UNIT/ML IJ SOLN
5000.0000 [IU] | Freq: Three times a day (TID) | INTRAMUSCULAR | Status: DC
  Administered 2024-08-07 – 2024-08-14 (×21): 5000 [IU] via SUBCUTANEOUS
  Filled 2024-08-07 (×22): qty 1

## 2024-08-07 MED ORDER — INSULIN ASPART 100 UNIT/ML IJ SOLN: 0.0000 [IU] | Freq: Every day | INTRAMUSCULAR | Status: DC

## 2024-08-07 MED ORDER — CLOPIDOGREL BISULFATE 75 MG PO TABS
75.0000 mg | ORAL_TABLET | Freq: Every day | ORAL | Status: DC
  Administered 2024-08-07 – 2024-08-14 (×8): 75 mg via ORAL
  Filled 2024-08-07 (×8): qty 1

## 2024-08-07 MED ORDER — STROKE: EARLY STAGES OF RECOVERY BOOK
Freq: Once | Status: AC
  Filled 2024-08-07: qty 1

## 2024-08-07 MED ORDER — HYDRALAZINE HCL 20 MG/ML IJ SOLN: 5.0000 mg | INTRAMUSCULAR | Status: DC | PRN

## 2024-08-07 MED ORDER — CLOPIDOGREL BISULFATE 75 MG PO TABS: 75.0000 mg | ORAL_TABLET | Freq: Every day | ORAL | Status: DC

## 2024-08-07 MED ORDER — ROSUVASTATIN CALCIUM 5 MG PO TABS
10.0000 mg | ORAL_TABLET | Freq: Every day | ORAL | Status: DC
  Administered 2024-08-07 – 2024-08-14 (×8): 10 mg via ORAL
  Filled 2024-08-07 (×8): qty 2

## 2024-08-07 MED ORDER — ACETAMINOPHEN 325 MG PO TABS
650.0000 mg | ORAL_TABLET | Freq: Four times a day (QID) | ORAL | Status: DC | PRN
  Administered 2024-08-07 – 2024-08-12 (×8): 650 mg via ORAL
  Filled 2024-08-07 (×9): qty 2

## 2024-08-07 MED ORDER — ASPIRIN 81 MG PO TBEC
81.0000 mg | DELAYED_RELEASE_TABLET | Freq: Every day | ORAL | Status: DC
  Administered 2024-08-07 – 2024-08-14 (×8): 81 mg via ORAL
  Filled 2024-08-07 (×8): qty 1

## 2024-08-07 MED ORDER — IOHEXOL 350 MG/ML SOLN
75.0000 mL | Freq: Once | INTRAVENOUS | Status: AC | PRN
  Administered 2024-08-07: 75 mL via INTRAVENOUS

## 2024-08-07 MED ORDER — HYDRALAZINE HCL 20 MG/ML IJ SOLN: 10.0000 mg | INTRAMUSCULAR | Status: DC | PRN

## 2024-08-07 MED ORDER — CARVEDILOL 12.5 MG PO TABS
12.5000 mg | ORAL_TABLET | Freq: Two times a day (BID) | ORAL | Status: DC
  Administered 2024-08-07 – 2024-08-08 (×3): 12.5 mg via ORAL
  Filled 2024-08-07 (×3): qty 1

## 2024-08-07 MED ORDER — CHLORHEXIDINE GLUCONATE CLOTH 2 % EX PADS
6.0000 | MEDICATED_PAD | Freq: Every day | CUTANEOUS | Status: DC
  Administered 2024-08-08: 6 via TOPICAL

## 2024-08-07 MED ORDER — AMLODIPINE BESYLATE 5 MG PO TABS
10.0000 mg | ORAL_TABLET | Freq: Every day | ORAL | Status: DC
  Administered 2024-08-07 – 2024-08-14 (×8): 10 mg via ORAL
  Filled 2024-08-07 (×8): qty 2

## 2024-08-07 MED ORDER — ACETAMINOPHEN 650 MG RE SUPP: 650.0000 mg | Freq: Four times a day (QID) | RECTAL | Status: DC | PRN

## 2024-08-07 MED ORDER — INSULIN ASPART 100 UNIT/ML IJ SOLN
0.0000 [IU] | Freq: Three times a day (TID) | INTRAMUSCULAR | Status: DC
  Administered 2024-08-08 – 2024-08-11 (×3): 1 [IU] via SUBCUTANEOUS
  Administered 2024-08-12: 2 [IU] via SUBCUTANEOUS
  Administered 2024-08-12 – 2024-08-14 (×3): 1 [IU] via SUBCUTANEOUS
  Filled 2024-08-07: qty 2
  Filled 2024-08-07 (×6): qty 1

## 2024-08-07 NOTE — ED Notes (Signed)
 Bp repeated with larger cuff 205/100

## 2024-08-07 NOTE — Evaluation (Signed)
 Physical Therapy Evaluation Patient Details Name: Blake Santana MRN: [id number] DOB: 1971/04/15 Today's Date: 08/07/2024  History of Present Illness  Pt is 54 year old presented to [facility] on  08/07/23 for aphasia. MRI showed acute lt posterior MCA infarct. PMH - ESRD on HD, DM, Diabetic retinopahty, HTN.  Clinical Impression  Pt admitted with above diagnosis and presents to PT with functional limitations due to deficits listed below (See PT problem list). Pt needs skilled PT to maximize independence and safety. Pt lives with wife and is typically mobile without assistive device. Today slightly unsteady but no loss of balance. May need HHPT at dc.           If plan is discharge home, recommend the following: A little help with walking and/or transfers;Help with stairs or ramp for entrance;Assist for transportation;Assistance with cooking/housework;A little help with bathing/dressing/bathroom   Can travel by private vehicle        Equipment Recommendations None recommended by PT  Recommendations for Other Services       Functional Status Assessment Patient has had a recent decline in their functional status and demonstrates the ability to make significant improvements in function in a reasonable and predictable amount of time.     Precautions / Restrictions Precautions Precautions: Fall;Other (comment) Recall of Precautions/Restrictions: Impaired Precaution/Restrictions Comments: aphasic Restrictions Weight Bearing Restrictions Per Provider Order: No      Mobility  Bed Mobility Overal bed mobility: Needs Assistance Bed Mobility: Supine to Sit, Sit to Supine     Supine to sit: Min assist, HOB elevated Sit to supine: Contact guard assist, HOB elevated   General bed mobility comments: Assist to initiate    Transfers Overall transfer level: Needs assistance Equipment used: None Transfers: Sit to/from Stand Sit to Stand: Contact guard assist           General  transfer comment: for safety    Ambulation/Gait Ambulation/Gait assistance: Contact guard assist Gait Distance (Feet): 100 Feet Assistive device: None Gait Pattern/deviations: Step-through pattern, Decreased step length - right, Decreased step length - left, Decreased stride length Gait velocity: decr Gait velocity interpretation: <1.31 ft/sec, indicative of household ambulator   General Gait Details: Assist for safety. Slightly unsteady but no loss of balance  Stairs            Wheelchair Mobility     Tilt Bed    Modified Rankin (Stroke Patients Only)       Balance Overall balance assessment: Needs assistance Sitting-balance support: No upper extremity supported, Feet unsupported Sitting balance-Leahy Scale: Good     Standing balance support: No upper extremity supported, During functional activity Standing balance-Leahy Scale: Fair                               Pertinent Vitals/Pain Pain Assessment Pain Assessment: Faces Faces Pain Scale: No hurt    Home Living Family/patient expects to be discharged to:: Private residence Living Arrangements: Spouse/significant other Available Help at Discharge: Family;Available 24 hours/day Type of Home: House Home Access: Stairs to enter Entrance Stairs-Rails: None Entrance Stairs-Number of Steps: 2-3   Home Layout: One level Home Equipment: None      Prior Function Prior Level of Function : Independent/Modified Independent             Mobility Comments: No assistive device       Extremity/Trunk Assessment   Upper Extremity Assessment Upper Extremity Assessment: Defer to OT evaluation  Lower Extremity Assessment Lower Extremity Assessment: Generalized weakness       Communication        Cognition Arousal: Alert Behavior During Therapy: WFL for tasks assessed/performed   PT - Cognitive impairments: Difficult to assess Difficult to assess due to: Impaired communication,  Non-English speaking                     PT - Cognition Comments: Pt aphasic. Spanish speaking. Used video interpreter Dedra [phone] Following commands: Impaired Following commands impaired: Follows one step commands inconsistently, Follows one step commands with increased time (needed multimodal cues)     Cueing Cueing Techniques: Verbal cues, Visual cues, Gestural cues, Tactile cues     General Comments General comments (skin integrity, edema, etc.): VSS on RA    Exercises     Assessment/Plan    PT Assessment Patient needs continued PT services  PT Problem List Decreased strength;Decreased mobility;Decreased balance       PT Treatment Interventions DME instruction;Stair training;Gait training;Functional mobility training;Therapeutic exercise;Therapeutic activities;Balance training;Patient/family education    PT Goals (Current goals can be found in the Care Plan section)  Acute Rehab PT Goals Patient Stated Goal: return home PT Goal Formulation: With patient/family Time For Goal Achievement: 08/21/24 Potential to Achieve Goals: Good    Frequency Min 2X/week     Co-evaluation               AM-PAC PT 6 Clicks Mobility  Outcome Measure Help needed turning from your back to your side while in a flat bed without using bedrails?: A Little Help needed moving from lying on your back to sitting on the side of a flat bed without using bedrails?: A Little Help needed moving to and from a bed to a chair (including a wheelchair)?: A Little Help needed standing up from a chair using your arms (e.g., wheelchair or bedside chair)?: A Little Help needed to walk in hospital room?: A Little Help needed climbing 3-5 steps with a railing? : A Little 6 Click Score: 18    End of Session   Activity Tolerance: Patient tolerated treatment well Patient left: in bed;with call bell/phone within reach;with family/visitor present   PT Visit Diagnosis: Unsteadiness on feet  (R26.81);Other abnormalities of gait and mobility (R26.89);Muscle weakness (generalized) (M62.81)    Time: 8474-8440 PT Time Calculation (min) (ACUTE ONLY): 34 min   Charges:   PT Evaluation $PT Eval Moderate Complexity: 1 Mod PT Treatments $Gait Training: 8-22 mins PT General Charges $$ ACUTE PT VISIT: 1 Visit         [facility] PT Acute Rehabilitation Services Office [phone]   Rodgers ORN [facility] 08/07/2024, 5:47 PM

## 2024-08-07 NOTE — Progress Notes (Signed)
 Pt receives out-pt HD at [facility] [place], [place], maine chair time. Will continue to assist as needed.    [name]  Dialysis Navigator [phone]

## 2024-08-07 NOTE — TOC CAGE-AID Note (Signed)
 Transition of Care [facility]) - CAGE-AID Screening   Patient Details  Name: Blake Santana MRN: [id number] Date of Birth: 08/05/1970  Transition of Care [facility]) CM/SW Contact:    [name], LCSW Phone Number: 08/07/2024, 9:33 AM   Clinical Narrative: No SA noted.   CAGE-AID Screening:    Have You Ever Felt You Ought to Cut Down on Your Drinking or Drug Use?: No Have People Annoyed You By Critizing Your Drinking Or Drug Use?: No Have You Felt Bad Or Guilty About Your Drinking Or Drug Use?: No Have You Ever Had a Drink or Used Drugs First Thing In The Morning to Steady Your Nerves or to Get Rid of a Hangover?: No CAGE-AID Score: 0  Substance Abuse Education Offered: No

## 2024-08-07 NOTE — Consult Note (Signed)
 NEUROLOGY CONSULT NOTE   Date of service: August 07, 2024 Patient Name: Blake Santana MRN:  [id number] DOB:  11/14/1970 Chief Complaint: aphasia Requesting Provider: Alfornia Madison, MD  History of Present Illness  Blake Santana is a 54 y.o. male with hx of ESRD on HD, DM2 with retinopathy, HTN, HLD, PAD who presents to the ED with global aphasia. Wife at bedside reports that on Friday, pt told her that he was having difficulty with holding a conversation with someone. He felt odd. All of Saturday, he was not very talkative. She thought it was because he was tired due to dialysis. He was still able to communicate. Sunday AM around 11, he was not making sense so she brought him to the ED.  He does not smoke, social EtOH, does not use any recreational substances.  LKW: 08/04/24 Modified rankin score: 1-No significant post stroke disability and can perform usual duties with stroke symptoms IV Thrombolysis: not offered, outside window EVT: not offered, outside window.  NIHSS components Score: Comment  1a Level of Conscious 0[x] 1[] 2[] 3[]     1b LOC Questions 0[] 1[] 2[x]      1c LOC Commands 0[] 1[] 2[x]      2 Best Gaze 0[x] 1[] 2[]      3 Visual 0[x] 1[] 2[] 3[]     4 Facial Palsy 0[x] 1[] 2[] 3[]     5a Motor Arm - left 0[x] 1[] 2[] 3[] 4[] UN[]   5b Motor Arm - Right 0[x] 1[] 2[] 3[] 4[] UN[]   6a Motor Leg - Left 0[x] 1[] 2[] 3[] 4[] UN[]   6b Motor Leg - Right 0[x] 1[] 2[] 3[] 4[] UN[]   7 Limb Ataxia 0[x] 1[] 2[] UN[]     8 Sensory 0[x] 1[] 2[] UN[]     9 Best Language 0[] 1[] 2[] 3[x]     10 Dysarthria 0[x] 1[] 2[] UN[]     11  Extinct. and Inattention 0[x]  1[]  2[]       TOTAL: 7      ROS   Unable to ascertain due to aphasia.  Past History   Past Medical History:  Diagnosis Date   Acute respiratory failure with hypoxia (HCC) 09/07/2018   Anemia of chronic kidney failure 03/11/2017   Cataracts, bilateral 2019   Chest pain 09/01/2018    minimal luminal irregularities   Chronic diastolic CHF (congestive heart failure) (HCC)    Chronic kidney disease 03/11/2017   Dialysis Tu/Th/Sa   COVID 2022   hospitalized   Diabetes mellitus without complication (HCC) 1999   Edema 01/14/2017   Headache    Hypercholesterolemia    Hypertension 03/11/2017   Hypertensive retinopathy of both eyes 2019   Iron  deficiency anemia 06/18/2016   Pneumonia 09/07/2018   Proliferative diabetic retinopathy with macular edema (HCC) 2019   Retinal hemorrhage, bilateral and left vitreous hemorrhage.  Being treated with Lucentis.  Dr. Raj   RSV (respiratory syncytial virus infection) 08/2017    Past Surgical History:  Procedure Laterality Date   A/V FISTULAGRAM Left 01/19/2018   Procedure: A/V FISTULAGRAM - Left AV;  Surgeon: Sheree Penne Bruckner, MD;  Location: [facility] INVASIVE CV LAB;  Service: Cardiovascular;  Laterality: Left;   AV FISTULA PLACEMENT Left 08/09/2017   Procedure: LEFT RADIOCEPHALIC ARTERIOVENOUS (AV) FISTULA CREATION;  Surgeon: Oris Krystal FALCON, MD;  Location: [facility] OR;  Service: Vascular;  Laterality: Left;   AV FISTULA PLACEMENT Left 02/02/2018   Procedure: ARTERIOVENOUS (AV)  FISTULA CREATION LEFT UPPER EXTREMITY;  Surgeon: Oris Krystal FALCON, MD;  Location: [facility] OR;  Service: Vascular;  Laterality: Left;   INSERTION OF DIALYSIS CATHETER  08/09/2017   INSERTION OF DIALYSIS CATHETER Right 08/09/2017   Procedure: INSERTION OF DIALYSIS CATHETER RIGHT INTERNAL JUGULAR;  Surgeon: Oris Krystal FALCON, MD;  Location: [facility] OR;  Service: Vascular;  Laterality: Right;   IR DIALY SHUNT INTRO NEEDLE/INTRACATH INITIAL W/IMG LEFT Left 07/10/2020   IR DIALY SHUNT INTRO NEEDLE/INTRACATH INITIAL W/IMG LEFT Left 04/14/2021   IR REMOVAL TUN CV CATH W/O FL  07/01/2018   LEFT HEART CATH AND CORONARY ANGIOGRAPHY N/A 09/05/2018   Procedure: LEFT HEART CATH AND CORONARY ANGIOGRAPHY;  Surgeon: Dann Candyce RAMAN, MD;  Location: [facility] INVASIVE CV LAB;  Service: Cardiovascular;   Laterality: N/A;   OPEN REDUCTION INTERNAL FIXATION (ORIF) DISTAL RADIAL FRACTURE Left 11/20/2021   Procedure: POSSIBLE OPEN REDUCTION INTERNAL FIXATION (ORIF) DISTAL RADIAL FRACTURE;  Surgeon: Kendal Franky SQUIBB, MD;  Location: [facility] OR;  Service: Orthopedics;  Laterality: Left;   ORIF ANKLE FRACTURE Left 11/20/2021   Procedure: OPEN REDUCTION INTERNAL FIXATION (ORIF) ANKLE PILON FRACTURE;  Surgeon: Kendal Franky SQUIBB, MD;  Location: [facility] OR;  Service: Orthopedics;  Laterality: Left;   ORIF TIBIA PLATEAU Right 11/20/2021   Procedure: OPEN REDUCTION INTERNAL FIXATION (ORIF) TIBIAL PLATEAU;  Surgeon: Kendal Franky SQUIBB, MD;  Location: [facility] OR;  Service: Orthopedics;  Laterality: Right;   PARATHYROIDECTOMY N/A 10/26/2022   Procedure: total PARATHYROIDECTOMY;  Surgeon: Eletha Krystal, MD;  Location: [facility] OR;  Service: General;  Laterality: N/A;   PARATHYROIDECTOMY Right 10/26/2022   Procedure: AUTOTRANSPLANT TO RIGHT FOREARM;  Surgeon: Eletha Krystal, MD;  Location: [facility] OR;  Service: General;  Laterality: Right;    Family History: Family History  Problem Relation Age of Onset   Diabetes Mother    Kidney disease Mother        renal failure--diabetes, cause of death   Hypertension Mother    Diabetes Father    Kidney disease Father        Cause of death:  kidney failure form diabetes   Alcohol abuse Father    Diabetes Sister     Social History  reports that he has quit smoking. His smoking use included cigarettes. He has been exposed to tobacco smoke. He has never used smokeless tobacco. He reports that he does not currently use alcohol. He reports that he does not use drugs.  Allergies[1]  Medications  Current Medications[2]  Vitals   Vitals:   08/07/24 0230 08/07/24 0250 08/07/24 0300 08/07/24 0315  BP: (!) 194/92  (!) 189/88 (!) 190/89  Pulse: 95 94 98 97  Resp: 20 (!) 21 15 15   Temp:  98.3 F (36.8 C)    TempSrc:  Oral    SpO2: 96% 90% 93% 92%  Weight:      Height:        Body mass index is 26.7  kg/m.   Physical Exam   General: Laying comfortably in bed; in no acute distress.  HENT: Normal oropharynx and mucosa. Normal external appearance of ears and nose.  Neck: Supple, no pain or tenderness  CV: No JVD. No peripheral edema.  Pulmonary: Symmetric Chest rise. Normal respiratory effort.  Abdomen: Soft to touch, non-tender.  Ext: No cyanosis, edema, or deformity Skin: No rash. Normal palpation of skin.   Musculoskeletal: Normal digits and nails by inspection. No clubbing.   Neurologic Examination  Mental status/Cognition: Alert, unable to answer orientation questions. Does  mimic. Speech/language: non fluent, get stuck on words, unable to finish a sentence. Unable to name objects or follow commands. Cranial nerves:   CN II Pupils equal and reactive to light, no VF deficits    CN III,IV,VI EOM intact, no gaze preference or deviation, no nystagmus    CN V normal sensation in V1, V2, and V3 segments bilaterally    CN VII no asymmetry, no nasolabial fold flattening    CN VIII normal hearing to speech    CN IX & X normal palatal elevation, no uvular deviation    CN XI 5/5 head turn and 5/5 shoulder shrug bilaterally    CN XII midline tongue protrusion    Motor:  Muscle bulk: normal, tone normal Mvmt Root Nerve  Muscle Right Left Comments  SA C5/6 Ax Deltoid     EF C5/6 Mc Biceps 5 5   EE C6/7/8 Rad Triceps 5 5   WF C6/7 Med FCR     WE C7/8 PIN ECU     F Ab C8/T1 U ADM/FDI 5 5   HF L1/2/3 Fem Illopsoas 5 5   KE L2/3/4 Fem Quad     DF L4/5 D Peron Tib Ant 5 5   PF S1/2 Tibial Grc/Sol 5 5    Sensation:  Light touch    Pin prick Localizes to pinch in all extremities.   Temperature    Vibration   Proprioception    Coordination/Complex Motor:  - Finger to Nose intact BL - Heel to shin unable to get him to do - Gait: deferred.  Labs/Imaging/Neurodiagnostic studies   CBC:  Recent Labs  Lab 18-Aug-2024 1727 August 18, 2024 1746  WBC 8.8  --   NEUTROABS 5.9  --   HGB  12.1* 12.9*  HCT 37.3* 38.0*  MCV 97.9  --   PLT 162  --    Basic Metabolic Panel:  Lab Results  Component Value Date   NA 136 08/07/2024   K 5.7 (H) 08/07/2024   CO2 22 08/07/2024   GLUCOSE 134 (H) 08/07/2024   BUN 58 (H) 08/07/2024   CREATININE 11.50 (H) 08/07/2024   CALCIUM  7.9 (L) 08/07/2024   GFRNONAA 5 (L) 08/07/2024   GFRAA 11 (L) 04/02/2020   Lipid Panel:  Lab Results  Component Value Date   LDLCALC 122 (H) 08/07/2024   HgbA1c:  Lab Results  Component Value Date   HGBA1C 8.5 (H) 08/07/2024   Urine Drug Screen: No results found for: LABOPIA, COCAINSCRNUR, LABBENZ, AMPHETMU, THCU, LABBARB  Alcohol Level     Component Value Date/Time   [facility] <15 2024/08/18 1727   INR  Lab Results  Component Value Date   INR 1.0 08/18/2024   APTT  Lab Results  Component Value Date   APTT 30 08/18/2024   AED levels: No results found for: PHENYTOIN, ZONISAMIDE, LAMOTRIGINE, LEVETIRACETA  CT Head without contrast(Personally reviewed): Focal cortical and subcortical hypodensity at the left temporoparietal lobe, suspicious for acute to subacute infarct. No hemorrhage. No significant mass effect or midline shift.  CT angio Head and Neck with contrast(Personally reviewed): 1. Occluded proximal left M2 MCA branch. 2. Severe bilateral P2 PCA stenosis. 3. Moderate right intradural vertebral artery stenosis.  CT venogram head(Personally reviewed): No CVST.  MRI Brain(Personally reviewed): 1. Acute left posterior MCA territory infarct. 2. Numerous punctate foci of susceptibility artifact throughout the brain, possibly prior micro emboli. ASSESSMENT   Blake Santana is a 54 y.o. male with hx of ESRD on HD, DM2 with retinopathy,  HTN, HLD, PAD who presents to the ED with global aphasia and found to have L MCA stroke.   Stroke appears embolic in nature. Etiology is still pending full workup.  RECOMMENDATIONS  - Frequent Neuro checks per stroke unit  protocol - Recommend brain imaging with MRI Brain without contrast - Recommend obtaining TTE  - Recommend obtaining Lipid panel with LDL - Please start statin if LDL > 70 - Recommend HbA1c to evaluate for diabetes and how well it is controlled. - Antithrombotic - Aspirin  81mg  daily along with plavix  75mg  daily x 21 days, followed by Aspirin  81mg  daily alone. - Recommend DVT ppx - SBP goal - permissive hypertension first 24 h < 220/110. Held home meds.  - Recommend Telemetry monitoring for arrythmia - Recommend bedside swallow screen prior to PO intake. - Stroke education booklet - Recommend PT/OT/SLP consult  ______________________________________________________________________    Signed, Leolia Vinzant, MD [facility]     [1] No Known Allergies [2]  Current Facility-Administered Medications:    [START ON 08/08/2024]  stroke: early stages of recovery book, , Does not apply, Once, Alfornia Madison, MD   acetaminophen  (TYLENOL ) tablet 650 mg, 650 mg, Oral, Q6H PRN, 650 mg at 08/07/24 0224 **OR** acetaminophen  (TYLENOL ) suppository 650 mg, 650 mg, Rectal, Q6H PRN, Alfornia Madison, MD   Chlorhexidine  Gluconate Cloth 2 % PADS 6 each, 6 each, Topical, Q0600, Geralynn Charleston, MD   heparin  injection 5,000 Units, 5,000 Units, Subcutaneous, Q8H, Rathore, Vasundhra, MD   hydrALAZINE  (APRESOLINE ) injection 5 mg, 5 mg, Intravenous, Q4H PRN, Rathore, Vasundhra, MD   insulin  aspart (novoLOG ) injection 0-5 Units, 0-5 Units, Subcutaneous, QHS, Rathore, Vasundhra, MD   insulin  aspart (novoLOG ) injection 0-6 Units, 0-6 Units, Subcutaneous, TID WC, Rathore, Vasundhra, MD  Current Outpatient Medications:    acetaminophen  (TYLENOL ) 500 MG tablet, Take 1,000 mg by mouth every 6 (six) hours as needed for moderate pain or headache., Disp: , Rfl:    AgaMatrix Ultra-Thin Lancets MISC, Check blood glucose twice daily before meals., Disp: 100 each, Rfl: 11   amLODipine  (NORVASC ) 10 MG  tablet, Take 1 tablet (10 mg total) by mouth daily. (Patient not taking: Reported on 08/07/2024), Disp: 90 tablet, Rfl: 3   aspirin -acetaminophen -caffeine (EXCEDRIN MIGRAINE) 250-250-65 MG tablet, Take 1-2 tablets by mouth every 6 (six) hours as needed for headache., Disp: , Rfl:    Blood Glucose Monitoring Suppl (AGAMATRIX PRESTO) w/Device KIT, Check sugars twice daily, Disp: 1 kit, Rfl: 0   Blood Glucose Monitoring Suppl (TRUE METRIX METER) w/Device KIT, Use as directed, Disp: 1 kit, Rfl: 0   calcitRIOL  (ROCALTROL ) 0.5 MCG capsule, Take 6 capsules (3 mcg total) by mouth 2 (two) times daily., Disp: 60 capsule, Rfl: 4   calcium  carbonate (TUMS - DOSED IN MG ELEMENTAL CALCIUM ) 500 MG chewable tablet, Chew 5 tablets (1,000 mg of elemental calcium  total) by mouth 4 (four) times daily., Disp: 120 tablet, Rfl: 2   carvedilol  (COREG ) 12.5 MG tablet, Take 1 tablet (12.5 mg total) by mouth 2 (two) times daily., Disp: 180 tablet, Rfl: 3   ezetimibe  (ZETIA ) 10 MG tablet, Take 1 tablet (10 mg total) by mouth daily. (Patient not taking: Reported on 02/29/2024), Disp: 90 tablet, Rfl: 3   glipiZIDE  (GLUCOTROL ) 10 MG tablet, Take 1 tablet (10 mg total) by mouth daily before breakfast., Disp: 90 tablet, Rfl: 3   glucose blood (TRUE METRIX BLOOD GLUCOSE TEST) test strip, Use as instructed, Disp: 100 each, Rfl: 12   lanthanum  (FOSRENOL ) 1000 MG chewable tablet,  Chew 1 tablet (1,000 mg total) by mouth 2 (two) times daily with a meal., Disp: 180 tablet, Rfl: 3   metFORMIN  (GLUCOPHAGE ) 1000 MG tablet, Take 1 tablet (1,000 mg total) by mouth 2 (two) times daily with a meal., Disp: 180 tablet, Rfl: 3   rosuvastatin  (CRESTOR ) 10 MG tablet, Take 1 tablet (10 mg total) by mouth daily., Disp: 90 tablet, Rfl: 3   TRUEplus Lancets 28G MISC, Use as directed, Disp: 100 each, Rfl: 4

## 2024-08-07 NOTE — Progress Notes (Signed)
 Pt. Came in awake and oriented with no complaints Consent signed and on file. Tx started with no complaints   UF removal: Tx duration: 3 25 hours  Access used: Left AVF Access issue: None  Dr. Geralynn ordered to lower UF removal to 2L and UF on if BP >135  Tx completed and tolerated. Pressure dressing applied. Endorsed to floor nurse. Transported back to room.   Joangel Vanosdol Rubi Amarii Amy, RN [facility]

## 2024-08-07 NOTE — ED Provider Notes (Incomplete)
 " Blake [facility]ana [facility] AT [facility] Provider Note   CSN: [id number] Arrival date & time: 08/06/24  1718     Patient presents with: Aphasia and Numbness   Blake Santana is a 54 y.o. male.  The history is provided by the patient. The history is limited by a language barrier. A language interpreter was used.    Patient is a 54 year old male presenting ED today for concern with family bedside for concerns for aphasia, left-sided weakness to left face and left hand.  Noted to have had an episode of dysphagia last night with slurred speech at dinner that resolved on approximately 5 to 10 minutes.  Returned to baseline according to family who is at bedside.  On hemodialysis MWF, last dialysis on Friday.   Patiently notably struggling with aphasia, with family member at bedside providing history.  Reports that he had returned of symptoms at approximate 11:00 this afternoon with slurred speech, aphasia and right-sided numbness to face and left hand.    Family at bedside report that he has not taken any of his medications today.  Family deny fever, cough, congestion, vomiting, diarrhea, lower leg swelling.     Prior to Admission medications  Medication Sig Start Date End Date Taking? Authorizing Provider  acetaminophen  (TYLENOL ) 500 MG tablet Take 1,000 mg by mouth every 6 (six) hours as needed for moderate pain or headache.    [provider]  AgaMatrix Ultra-Thin Lancets MISC Check blood glucose twice daily before meals. 06/09/21   Oley Bascom RAMAN, NP  amLODipine  (NORVASC ) 10 MG tablet Take 1 tablet (10 mg total) by mouth daily. 04/13/22   Shlomo Wilbert SAUNDERS, MD  aspirin -acetaminophen -caffeine (EXCEDRIN MIGRAINE) 250-250-65 MG tablet Take 1-2 tablets by mouth every 6 (six) hours as needed for headache.    [provider]  Blood Glucose Monitoring Suppl (AGAMATRIX PRESTO) w/Device KIT Check sugars twice daily 06/04/16   Adella Norris, MD   Blood Glucose Monitoring Suppl (TRUE METRIX METER) w/Device KIT Use as directed 12/10/21   Jaycee Greig PARAS, NP  calcitRIOL  (ROCALTROL ) 0.5 MCG capsule Take 6 capsules (3 mcg total) by mouth 2 (two) times daily. 10/31/22   Sheldon Standing, MD  calcium  carbonate (TUMS - DOSED IN MG ELEMENTAL CALCIUM ) 500 MG chewable tablet Chew 5 tablets (1,000 mg of elemental calcium  total) by mouth 4 (four) times daily. 10/31/22   Sheldon Standing, MD  carvedilol  (COREG ) 12.5 MG tablet Take 1 tablet (12.5 mg total) by mouth 2 (two) times daily. 04/13/22   Shlomo Wilbert SAUNDERS, MD  ezetimibe  (ZETIA ) 10 MG tablet Take 1 tablet (10 mg total) by mouth daily. Patient not taking: Reported on 02/29/2024 04/14/22   Shlomo Wilbert SAUNDERS, MD  glipiZIDE  (GLUCOTROL ) 10 MG tablet Take 1 tablet (10 mg total) by mouth daily before breakfast. 02/29/24   Leavy Rode, [place], PA-C  glucose blood (TRUE METRIX BLOOD GLUCOSE TEST) test strip Use as instructed 12/10/21   Jaycee Greig PARAS, NP  lanthanum  (FOSRENOL ) 1000 MG chewable tablet Chew 1 tablet (1,000 mg total) by mouth 2 (two) times daily with a meal. 02/29/24   Leavy Rode, [place], PA-C  metFORMIN  (GLUCOPHAGE ) 1000 MG tablet Take 1 tablet (1,000 mg total) by mouth 2 (two) times daily with a meal. 02/29/24   Leavy Rode, [place], PA-C  rosuvastatin  (CRESTOR ) 10 MG tablet Take 1 tablet (10 mg total) by mouth daily. 03/02/24   Leavy Rode Sula, PA-C  traMADol  (ULTRAM ) 50 MG tablet Take 1 tablet (50 mg  total) by mouth every 6 (six) hours as needed for moderate pain (mild pain). 10/31/22   Sheldon Standing, MD  TRUEplus Lancets 28G MISC Use as directed 12/10/21   Jaycee Greig PARAS, NP    Allergies: Patient has no known allergies.    Review of Systems  Neurological:  Positive for speech difficulty and weakness.  All other systems reviewed and are negative.   Updated Vital Signs BP (!) 231/94 (BP Location: Right Arm)   Pulse 72   Temp 98.3 F (36.8 C)   Resp 19   SpO2 95%   Physical Exam Vitals and nursing  note reviewed.  Constitutional:      General: He is not in acute distress.    Appearance: Normal appearance. He is not ill-appearing or diaphoretic.  HENT:     Head: Normocephalic and atraumatic.  Eyes:     General: No scleral icterus.       Right eye: No discharge.        Left eye: No discharge.     Extraocular Movements: Extraocular movements intact.     Conjunctiva/sclera: Conjunctivae normal.  Cardiovascular:     Rate and Rhythm: Normal rate and regular rhythm.     Pulses: Normal pulses.     Heart sounds: Normal heart sounds. No murmur heard.    No friction rub. No gallop.  Pulmonary:     Effort: Pulmonary effort is normal. No respiratory distress.     Breath sounds: No stridor. No wheezing, rhonchi or rales.  Chest:     Chest wall: No tenderness.  Abdominal:     General: Abdomen is flat. There is no distension.     Palpations: Abdomen is soft.     Tenderness: There is no abdominal tenderness. There is no right CVA tenderness, left CVA tenderness, guarding or rebound.  Musculoskeletal:        General: No swelling, deformity or signs of injury.     Cervical back: Normal range of motion. No rigidity.     Right lower leg: No edema.     Left lower leg: No edema.  Skin:    General: Skin is warm and dry.     Findings: No bruising, erythema or lesion.  Neurological:     General: No focal deficit present.     Mental Status: He is alert.     Sensory: Sensory deficit present.     Motor: No weakness.     Comments: Noted to have apraxia and aphasia  Unable to perform full neuroexam with patient possibly not understanding interpreter versus suffering from aphasia/apraxia.  Grossly moving all extremities, normal grip strength bilaterally.  Endorsing normal sensation to right side.  Additionally able to lift at hips bilaterally, noted to have 4 out of 5 strength on the left and 5 out of 5 strength on right.  Unable to perform further strength/sensation testing with patient not  following instructions.  Additionally unable to perform visual field testing with patient possibly not understanding instructions after repeated times versus aphasia/apraxia.   Psychiatric:        Mood and Affect: Mood normal.     (all labs ordered are listed, but only abnormal results are displayed) Labs Reviewed  CBG MONITORING, ED - Abnormal; Notable for the following components:      Result Value   Glucose-Capillary 168 (*)    All other components within normal limits  PROTIME-INR  APTT  CBC  DIFFERENTIAL  COMPREHENSIVE METABOLIC PANEL WITH GFR  ETHANOL  I-STAT  CHEM 8, ED  CBG MONITORING, ED  TROPONIN T, HIGH SENSITIVITY    EKG: None  Radiology: No results found.  .Critical Care  Performed by: Beola Terrall RAMAN, PA-C Authorized by: Beola Terrall RAMAN, PA-C   Critical care provider statement:    Critical care time (minutes):  45   Critical care was necessary to treat or prevent imminent or life-threatening deterioration of the following conditions:  Renal failure   Critical care was time spent personally by me on the following activities:  Development of treatment plan with patient or surrogate, discussions with consultants, evaluation of patient's response to treatment, examination of patient, ordering and review of laboratory studies, ordering and review of radiographic studies, ordering and performing treatments and interventions, pulse oximetry, re-evaluation of patient's condition, review of old charts and obtaining history from patient or surrogate   I assumed direction of critical care for this patient from another provider in my specialty: yes     Care discussed with: admitting provider      Medications Ordered in the ED  sodium chloride  flush (NS) 0.9 % injection 3 mL (has no administration in time range)    Clinical Course as of 08/06/24 1931  [place] Aug 06, 2024  1900 Spoke with neurology, Dr. Matthews who Recommend to the patient blood pressure be brought below  220/110. [CB]    Clinical Course User Index [CB] Beola Terrall RAMAN, PA-C   Medical Decision Making Amount and/or Complexity of Data Reviewed Radiology: ordered.  Risk OTC drugs. Prescription drug management. Decision regarding hospitalization.   This patient is a 54 year old male who presents to the ED for concern of acute aphasia, apraxia, left hand numbness and right facial drooping that started acutely last night noted to have some dysphagia present at that time at approximately 8 PM.  Reportedly returning to baseline where he then had recurrent symptoms again at approximately 11 AM today. ESRD patient on dialysis, finishing his full dialysis last Friday.  On physical exam, patient is in no acute distress, afebrile, alert, nontachypneic, nontachycardic.  Notably does appear to be aphasic, answering questions differently.  Uncertain if interpreter or disease processes is the cause.  LCTAB.  Notably is able to move all extremities grossly.  Unable to evaluate visual fields as patient is not understanding questions.  Additionally noting no lower leg edema.  Patient conversation, suspecting likely stroke, presents at the window.  Spoke with Dr. Matthews with neurology who recommended MRI.  CT scan did show stroke.  Patient also noted to be in hypertensive emergency with systolic of 240.  Intended to give labetalol  without improvement patient's systolic still at 230.  Gave hydralazine  where patient's blood pressure came down to 180.  States giving further blood pressure medications.  Additionally noting patient is hyperkalemic, providing Lokelma , albuterol , bicarb, insulin , and spoke with Dr. Vassie with nephrology who said that he was scheduled for him for dialysis tomorrow morning.  Spoke with Dr. Khaliqdina with neurology who said that he was becoming to evaluate the patient.  Will see to admit patient with patient having left posterior MCA stroke additionally noting hyperkalemia.  Patient  care transferred to Dr. Alfornia.   Differential diagnoses prior to evaluation: The emergent differential diagnosis includes, but is not limited to, hypertensive emergency, stroke, encephalopathy, metabolic disturbance. This is not an exhaustive differential.   Past Medical History / Co-morbidities / Social History: ESRD on dialysis, diabetes, HTN, anemia, CHF, HTN.  Additional history: Chart reviewed. Pertinent results include: ***  Lab Tests/Imaging  studies: I personally interpreted labs/imaging and the pertinent results include:  ***.   ***I agree with the radiologist interpretation.  Cardiac monitoring: EKG obtained and interpreted by myself and attending physician which shows: ***   Medications: I ordered medication including ***.  I have reviewed the patients home medicines and have made adjustments as needed.  Critical Interventions:  Social Determinants of Health:  Disposition: After consideration of the diagnostic results and the patients response to treatment, I feel that the patient would benefit from ***.   ***emergency department workup does not suggest an emergent condition requiring admission or immediate intervention beyond what has been performed at this time. The plan is: ***. The patient is safe for discharge and has been instructed to return immediately for worsening symptoms, change in symptoms or any other concerns.   Final diagnoses:  None    ED Discharge Orders     None        "

## 2024-08-07 NOTE — Progress Notes (Signed)
 PT Cancellation Note  Patient Details Name: Blake Santana MRN: [id number] DOB: Dec 15, 1970   Cancelled Treatment:    Reason Eval/Treat Not Completed: Patient at procedure or test/unavailable. Pt in HD.   Rodgers ORN [facility] 08/07/2024, 9:08 AM Rodgers Opal PT Acute [employer] [phone]

## 2024-08-07 NOTE — Progress Notes (Signed)
 OT Cancellation Note  Patient Details Name: Blake Santana MRN: [id number] DOB: 06/16/1971   Cancelled Treatment:    Reason Eval/Treat Not Completed: (P) Patient at procedure or test/ unavailable, Pt at HD earlier, having echo done now, BP has been high, will check back when appropriate for therapy  [name] 08/07/2024, 1:46 PM

## 2024-08-07 NOTE — Procedures (Signed)
 S: seen in HD unit, having problems speaking, was able to sign the consent form  Vitals:   08/07/24 0600 08/07/24 0828 08/07/24 0841 08/07/24 0845  BP: (!) 181/89 (!) 215/93 (!) 323/97 (!) 134/123  Pulse: 93 81 80 80  Resp: 16 18 18 18   Temp: 98.2 F (36.8 C) 98.5 F (36.9 C)    TempSrc:      SpO2: 96% 94% 93% (!) 89%  Weight:      Height:        Recent Labs  Lab 08/06/24 1727 08/06/24 1746 08/07/24 0227  HGB 12.1* 12.9*  --   ALBUMIN 4.1  --   --   CALCIUM  8.2*  --  7.9*  CREATININE 10.60* 11.70* 11.50*  K 6.4* 6.2* 5.7*    Inpatient medications:  [START ON 08/08/2024]  stroke: early stages of recovery book   Does not apply Once   aspirin  EC  81 mg Oral Daily   carvedilol   12.5 mg Oral BID   Chlorhexidine  Gluconate Cloth  6 each Topical Q0600   heparin   5,000 Units Subcutaneous Q8H   insulin  aspart  0-5 Units Subcutaneous QHS   insulin  aspart  0-6 Units Subcutaneous TID WC   rosuvastatin   10 mg Oral Daily    acetaminophen  **OR** acetaminophen , hydrALAZINE   I was present at the procedure, reviewed the HD regimen and made appropriate changes.   Myer Fret MD  [facility] 08/07/2024, 8:57 AM

## 2024-08-07 NOTE — Progress Notes (Signed)
" °   08/07/24 1208  Vitals  Temp 97.7 F (36.5 C)  Pulse Rate 94  Resp 17  BP (!) 205/94  SpO2 96 %  O2 Device Room Air  Weight (S)   (Unable to obtain)  Post Treatment  Dialyzer Clearance Clear  Liters Processed 68.2  Fluid Removed (mL) 2000 mL  Tolerated HD Treatment Yes  AVG/AVF Arterial Site Held (minutes) 7 minutes  AVG/AVF Venous Site Held (minutes) 7 minutes    "

## 2024-08-07 NOTE — H&P (Signed)
 " History and Physical    Blake Santana FMW:[id number] DOB: Jan 15, 1971 DOA: 08/06/2024  PCP: Leavy Lucas Sula, PA-C  Patient coming from: Home  Chief Complaint: Aphasia  HPI: Blake Santana is a 54 y.o. male with medical history significant of ESRD on HD TTS, HFpEF, type 2 diabetes with diabetic retinopathy, anemia of chronic disease, hypertension, hyperlipidemia, PAD presented to the ED with complaints of aphasia, facial/hand weakness/numbness, and dysphagia.  Out of window for TNK at the time of arrival.  Blood pressure 231/94 on arrival but remainder of vital signs stable.  Labs notable for hemoglobin 12.1 (at baseline), sodium 134, potassium 6.4, chloride 94, bicarb 24, ethanol level <15, troponin 45.  EKG without acute ischemic changes.  Brain MRI showing acute left posterior MCA territory infarct.  Patient was given IV hydralazine , IV labetalol , and IV Ativan  in the ED.  Also given insulin /dextrose , calcium  gluconate, albuterol , sodium bicarb, and Lokelma  for hyperkalemia.  Nephrology consulted and planning on taking the patient for dialysis in the morning.  Neurology consulted.  TRH called to admit.  Spanish video interpreter used.  Patient is endorsing left-sided chest pain and headache but not able to give any additional history.  History provided mostly by his wife at bedside who states patient last underwent dialysis on Friday, 08/04/2024 and that day he started having slurring of speech/word finding difficulty.  No weakness or numbness of arm or leg reported.  On Saturday evening they were at a family event where patient was having difficulty swallowing and started coughing and choking.  Yesterday patient started complaining of chest tightness so family brought him to the ED to be evaluated.  Wife states patient has been under a lot of stress recently as their house is under foreclosure and they have an upcoming court hearing tomorrow.  He has not been taking his home blood  pressure medications.  Review of Systems:  Review of Systems  All other systems reviewed and are negative.   Past Medical History:  Diagnosis Date   Acute respiratory failure with hypoxia (HCC) 09/07/2018   Anemia of chronic kidney failure 03/11/2017   Cataracts, bilateral 2019   Chest pain 09/01/2018   minimal luminal irregularities   Chronic diastolic CHF (congestive heart failure) (HCC)    Chronic kidney disease 03/11/2017   Dialysis Tu/Th/Sa   COVID 2022   hospitalized   Diabetes mellitus without complication (HCC) 1999   Edema 01/14/2017   Headache    Hypercholesterolemia    Hypertension 03/11/2017   Hypertensive retinopathy of both eyes 2019   Iron  deficiency anemia 06/18/2016   Pneumonia 09/07/2018   Proliferative diabetic retinopathy with macular edema (HCC) 2019   Retinal hemorrhage, bilateral and left vitreous hemorrhage.  Being treated with Lucentis.  Dr. Raj   RSV (respiratory syncytial virus infection) 08/2017    Past Surgical History:  Procedure Laterality Date   A/V FISTULAGRAM Left 01/19/2018   Procedure: A/V FISTULAGRAM - Left AV;  Surgeon: Sheree Penne Bruckner, MD;  Location: [facility] INVASIVE CV LAB;  Service: Cardiovascular;  Laterality: Left;   AV FISTULA PLACEMENT Left 08/09/2017   Procedure: LEFT RADIOCEPHALIC ARTERIOVENOUS (AV) FISTULA CREATION;  Surgeon: Oris Krystal FALCON, MD;  Location: [facility] OR;  Service: Vascular;  Laterality: Left;   AV FISTULA PLACEMENT Left 02/02/2018   Procedure: ARTERIOVENOUS (AV) FISTULA CREATION LEFT UPPER EXTREMITY;  Surgeon: Oris Krystal FALCON, MD;  Location: [facility] OR;  Service: Vascular;  Laterality: Left;   INSERTION OF DIALYSIS CATHETER  08/09/2017   INSERTION  OF DIALYSIS CATHETER Right 08/09/2017   Procedure: INSERTION OF DIALYSIS CATHETER RIGHT INTERNAL JUGULAR;  Surgeon: Oris Krystal FALCON, MD;  Location: [facility] OR;  Service: Vascular;  Laterality: Right;   IR DIALY SHUNT INTRO NEEDLE/INTRACATH INITIAL W/IMG LEFT Left 07/10/2020   IR DIALY  SHUNT INTRO NEEDLE/INTRACATH INITIAL W/IMG LEFT Left 04/14/2021   IR REMOVAL TUN CV CATH W/O FL  07/01/2018   LEFT HEART CATH AND CORONARY ANGIOGRAPHY N/A 09/05/2018   Procedure: LEFT HEART CATH AND CORONARY ANGIOGRAPHY;  Surgeon: Dann Candyce RAMAN, MD;  Location: [facility] INVASIVE CV LAB;  Service: Cardiovascular;  Laterality: N/A;   OPEN REDUCTION INTERNAL FIXATION (ORIF) DISTAL RADIAL FRACTURE Left 11/20/2021   Procedure: POSSIBLE OPEN REDUCTION INTERNAL FIXATION (ORIF) DISTAL RADIAL FRACTURE;  Surgeon: Kendal Franky SQUIBB, MD;  Location: [facility] OR;  Service: Orthopedics;  Laterality: Left;   ORIF ANKLE FRACTURE Left 11/20/2021   Procedure: OPEN REDUCTION INTERNAL FIXATION (ORIF) ANKLE PILON FRACTURE;  Surgeon: Kendal Franky SQUIBB, MD;  Location: [facility] OR;  Service: Orthopedics;  Laterality: Left;   ORIF TIBIA PLATEAU Right 11/20/2021   Procedure: OPEN REDUCTION INTERNAL FIXATION (ORIF) TIBIAL PLATEAU;  Surgeon: Kendal Franky SQUIBB, MD;  Location: [facility] OR;  Service: Orthopedics;  Laterality: Right;   PARATHYROIDECTOMY N/A 10/26/2022   Procedure: total PARATHYROIDECTOMY;  Surgeon: Eletha Krystal, MD;  Location: [facility] OR;  Service: General;  Laterality: N/A;   PARATHYROIDECTOMY Right 10/26/2022   Procedure: AUTOTRANSPLANT TO RIGHT FOREARM;  Surgeon: Eletha Krystal, MD;  Location: [facility] OR;  Service: General;  Laterality: Right;     reports that he has quit smoking. His smoking use included cigarettes. He has been exposed to tobacco smoke. He has never used smokeless tobacco. He reports that he does not currently use alcohol. He reports that he does not use drugs.  Allergies[1]  Family History  Problem Relation Age of Onset   Diabetes Mother    Kidney disease Mother        renal failure--diabetes, cause of death   Hypertension Mother    Diabetes Father    Kidney disease Father        Cause of death:  kidney failure form diabetes   Alcohol abuse Father    Diabetes Sister     Prior to Admission medications  Medication Sig  Start Date End Date Taking? Authorizing Provider  acetaminophen  (TYLENOL ) 500 MG tablet Take 1,000 mg by mouth every 6 (six) hours as needed for moderate pain or headache.    [provider]  AgaMatrix Ultra-Thin Lancets MISC Check blood glucose twice daily before meals. 06/09/21   Oley Bascom RAMAN, NP  amLODipine  (NORVASC ) 10 MG tablet Take 1 tablet (10 mg total) by mouth daily. 04/13/22   Shlomo Wilbert SAUNDERS, MD  aspirin -acetaminophen -caffeine (EXCEDRIN MIGRAINE) 250-250-65 MG tablet Take 1-2 tablets by mouth every 6 (six) hours as needed for headache.    [provider]  Blood Glucose Monitoring Suppl (AGAMATRIX PRESTO) w/Device KIT Check sugars twice daily 06/04/16   Adella Norris, MD  Blood Glucose Monitoring Suppl (TRUE METRIX METER) w/Device KIT Use as directed 12/10/21   Jaycee Greig PARAS, NP  calcitRIOL  (ROCALTROL ) 0.5 MCG capsule Take 6 capsules (3 mcg total) by mouth 2 (two) times daily. 10/31/22   Sheldon Standing, MD  calcium  carbonate (TUMS - DOSED IN MG ELEMENTAL CALCIUM ) 500 MG chewable tablet Chew 5 tablets (1,000 mg of elemental calcium  total) by mouth 4 (four) times daily. 10/31/22   Sheldon Standing, MD  carvedilol  (COREG ) 12.5 MG tablet Take  1 tablet (12.5 mg total) by mouth 2 (two) times daily. 04/13/22   Shlomo Wilbert SAUNDERS, MD  ezetimibe  (ZETIA ) 10 MG tablet Take 1 tablet (10 mg total) by mouth daily. Patient not taking: Reported on 02/29/2024 04/14/22   Shlomo Wilbert SAUNDERS, MD  glipiZIDE  (GLUCOTROL ) 10 MG tablet Take 1 tablet (10 mg total) by mouth daily before breakfast. 02/29/24   Leavy Rode, Sula, PA-C  glucose blood (TRUE METRIX BLOOD GLUCOSE TEST) test strip Use as instructed 12/10/21   Jaycee Greig PARAS, NP  lanthanum  (FOSRENOL ) 1000 MG chewable tablet Chew 1 tablet (1,000 mg total) by mouth 2 (two) times daily with a meal. 02/29/24   Leavy Rode, Sula, PA-C  metFORMIN  (GLUCOPHAGE ) 1000 MG tablet Take 1 tablet (1,000 mg total) by mouth 2 (two) times daily with a meal. 02/29/24    Leavy Rode, Sula, PA-C  rosuvastatin  (CRESTOR ) 10 MG tablet Take 1 tablet (10 mg total) by mouth daily. 03/02/24   Tapia Cedeno, Fines, PA-C  traMADol  (ULTRAM ) 50 MG tablet Take 1 tablet (50 mg total) by mouth every 6 (six) hours as needed for moderate pain (mild pain). 10/31/22   Sheldon Standing, MD  TRUEplus Lancets 28G MISC Use as directed 12/10/21   Jaycee Greig PARAS, NP    Physical Exam: Vitals:   08/06/24 2100 08/06/24 2110 08/06/24 2245 08/06/24 2328  BP: (!) 188/94  (!) 165/80   Pulse: 93 92 92   Resp: 16 18 13    Temp:   98.3 F (36.8 C)   TempSrc:   Oral   SpO2: 95% 95% 93%   Weight:    66.2 kg  Height:    5' 2 (1.575 m)    Physical Exam Vitals reviewed.  Constitutional:      General: He is not in acute distress. HENT:     Head: Normocephalic and atraumatic.  Eyes:     Extraocular Movements: Extraocular movements intact.  Cardiovascular:     Rate and Rhythm: Normal rate and regular rhythm.  Pulmonary:     Effort: Pulmonary effort is normal. No respiratory distress.     Breath sounds: Normal breath sounds.  Abdominal:     General: Bowel sounds are normal.     Palpations: Abdomen is soft.     Tenderness: There is no abdominal tenderness. There is no guarding.  Musculoskeletal:     Cervical back: Normal range of motion.     Right lower leg: No edema.     Left lower leg: No edema.  Skin:    General: Skin is warm and dry.  Neurological:     Mental Status: He is alert.     Comments: No obvious facial droop Strength and sensation to light touch intact in all extremities     Labs on Admission: I have personally reviewed following labs and imaging studies  CBC: Recent Labs  Lab 08/06/24 1727 08/06/24 1746  WBC 8.8  --   NEUTROABS 5.9  --   HGB 12.1* 12.9*  HCT 37.3* 38.0*  MCV 97.9  --   PLT 162  --    Basic Metabolic Panel: Recent Labs  Lab 08/06/24 1727 08/06/24 1746  NA 134* 135  K 6.4* 6.2*  CL 94* 98  CO2 24  --   GLUCOSE 171* 165*  BUN 50*  47*  CREATININE 10.60* 11.70*  CALCIUM  8.2*  --    GFR: Estimated Creatinine Clearance: 6 mL/min (A) (by C-G formula based on SCr of 11.7 mg/dL (H)). Liver Function  Tests: Recent Labs  Lab 08/06/24 1727  AST 13*  ALT 7  ALKPHOS 80  BILITOT 0.3  PROT 7.6  ALBUMIN 4.1   No results for input(s): LIPASE, AMYLASE in the last 168 hours. No results for input(s): AMMONIA in the last 168 hours. Coagulation Profile: Recent Labs  Lab 08/06/24 1727  INR 1.0   Cardiac Enzymes: No results for input(s): CKTOTAL, CKMB, CKMBINDEX, TROPONINI in the last 168 hours. BNP (last 3 results) No results for input(s): PROBNP in the last 8760 hours. HbA1C: No results for input(s): HGBA1C in the last 72 hours. CBG: Recent Labs  Lab 08/06/24 1727 08/06/24 2018  GLUCAP 168* 109*   Lipid Profile: No results for input(s): CHOL, HDL, LDLCALC, TRIG, CHOLHDL, LDLDIRECT in the last 72 hours. Thyroid Function Tests: No results for input(s): TSH, T4TOTAL, FREET4, T3FREE, THYROIDAB in the last 72 hours. Anemia Panel: No results for input(s): VITAMINB12, FOLATE, FERRITIN, TIBC, IRON , RETICCTPCT in the last 72 hours. Urine analysis:    Component Value Date/Time   COLORURINE YELLOW 08/08/2017 0354   APPEARANCEUR CLOUDY (A) 08/08/2017 0354   APPEARANCEUR Clear 04/01/2017 1107   LABSPEC 1.015 08/08/2017 0354   PHURINE 5.0 08/08/2017 0354   GLUCOSEU 150 (A) 08/08/2017 0354   HGBUR MODERATE (A) 08/08/2017 0354   BILIRUBINUR NEGATIVE 08/08/2017 0354   BILIRUBINUR neg 04/01/2017 1246   BILIRUBINUR Negative 04/01/2017 1107   KETONESUR NEGATIVE 08/08/2017 0354   PROTEINUR >=300 (A) 08/08/2017 0354   UROBILINOGEN 0.2 04/01/2017 1246   NITRITE NEGATIVE 08/08/2017 0354   LEUKOCYTESUR NEGATIVE 08/08/2017 0354   LEUKOCYTESUR Negative 04/01/2017 1107    Radiological Exams on Admission: MR BRAIN WO CONTRAST Result Date: 08/06/2024 EXAM: MRI BRAIN WITHOUT  CONTRAST 08/06/2024 09:36:57 PM TECHNIQUE: Multiplanar multisequence MRI of the head/brain was performed without the administration of intravenous contrast. COMPARISON: CT head earlier today. CLINICAL HISTORY: Neuro deficit, acute, stroke suspected FINDINGS: BRAIN AND VENTRICLES: Acute left posterior MCA territory infarct involving the left anterior temporal lobe, overlying parietal lobe and posterior left insula. Edema without mass effect. Numerous punctate foci of susceptibility artifact throughout the brain. No intracranial hemorrhage. No mass. No midline shift. No hydrocephalus. The sella is unremarkable. Normal flow voids. ORBITS: No acute abnormality. SINUSES AND MASTOIDS: No acute abnormality. BONES AND SOFT TISSUES: Normal marrow signal. No acute soft tissue abnormality. IMPRESSION: 1. Acute left posterior MCA territory infarct. 2. Numerous punctate foci of susceptibility artifact throughout the brain, possibly prior micro emboli. Electronically signed by: Gilmore Molt 08/06/2024 09:54 PM EST RP Workstation: [id number]   CT HEAD WO CONTRAST Result Date: 08/06/2024 CLINICAL DATA:  Altered aphasia EXAM: CT HEAD WITHOUT CONTRAST TECHNIQUE: Contiguous axial images were obtained from the base of the skull through the vertex without intravenous contrast. RADIATION DOSE REDUCTION: This exam was performed according to the departmental dose-optimization program which includes automated exposure control, adjustment of the mA and/or kV according to patient size and/or use of iterative reconstruction technique. COMPARISON:  CT brain 11/17/2021 FINDINGS: Brain: Focal cortical and subcortical hypodensity at the left temporoparietal lobe. No hemorrhage. No mass. No significant mass effect or midline shift. The ventricles are nonenlarged Vascular: No hyperdense vessels. Carotid and vertebral calcification. Extensive small vessel calcification. Skull: No fracture Sinuses/Orbits: No acute finding. Other: None IMPRESSION:  Focal cortical and subcortical hypodensity at the left temporoparietal lobe, suspicious for acute to subacute infarct. No hemorrhage. No significant mass effect or midline shift. Electronically Signed   By: Luke Bun M.D.   On: 08/06/2024 18:09  EKG: Independently reviewed.  Sinus rhythm, LVH, T wave inversions in lateral leads.  No significant change compared to previous tracings.  Assessment and Plan  Acute CVA Neurology consulted.  Antithrombotic therapy per neurology recommendation.  Echocardiogram, hemoglobin A1c, and lipid panel ordered.  Continue telemetry monitoring, frequent neurochecks.  PT/OT/SLP.  Patient has passed bedside swallow screen.  Hyperkalemia ESRD on HD TTS Patient was given insulin /dextrose , calcium  gluconate, albuterol , sodium bicarb, and Lokelma  in the ED.  Nephrology planning on taking him for dialysis in the morning.  Repeat labs ordered to check potassium level.  Hypertensive emergency In the setting of medication nonadherence.  SBP initially in the 230s, now improved to 180s after IV hydralazine  and IV labetalol  in the ED. Given acute CVA, discussed with neurology and they are recommending allowing permissive hypertension up to systolic 210.  Chest pain and elevated troponin Spanish interpreter used but patient appears to be a poor historian.  He is pointing to the left side of his chest and also his head.  ?ACS. EKG without acute ischemic changes.  Repeat troponin pending.  Chronic HFpEF Last echo done in January 2020 showing EF 50 to 55%, mild mitral regurgitation, mild tricuspid regurgitation.  Repeat echo ordered in the setting of acute CVA as above.  Patient is mildly hypoxic with oxygen saturation 90-93% on room air at rest, chest x-ray ordered.  Type 2 diabetes Poorly controlled -hemoglobin A1c 10.2 on 02/29/2024.  Repeat A1c ordered and placed on very sensitive sliding scale insulin  ACHS.  Anemia of chronic disease Hemoglobin  stable.  Hyperlipidemia PAD Unable to verify home medications with the patient and his wife.  Awaiting pharmacy med rec.  DVT prophylaxis: SQ Heparin  Code Status: Full Code (discussed with the patient and his wife) Family Communication: Wife at bedside. Consults called: Nephrology, neurology Level of care: Progressive Care Unit Admission status: It is my clinical opinion that admission to INPATIENT is reasonable and necessary because of the expectation that this patient will require hospital care that crosses at least 2 midnights to treat this condition based on the medical complexity of the problems presented.  Given the aforementioned information, the predictability of an adverse outcome is felt to be significant.  Editha Ram MD [facility]  If 7PM-7AM, please contact night-coverage [email or web address]  08/07/2024, 12:40 AM       [1] No Known Allergies  "

## 2024-08-07 NOTE — Progress Notes (Signed)
 Received new pt into room at 1848.  Oriented to room and placed on tele and vitals obtained.  Notified night nurse of admit.

## 2024-08-07 NOTE — Progress Notes (Signed)
 " PROGRESS NOTE    Blake Santana  FMW:[id number] DOB: 1971-05-25 DOA: 08/06/2024 PCP: Leavy Lucas Sula, PA-C   Brief Narrative:   Blake Santana is a 54 y.o. male with medical history significant of ESRD on HD TTS, HFpEF, type 2 diabetes with diabetic retinopathy, anemia of chronic disease, hypertension, hyperlipidemia, PAD presented to the ED with complaints of aphasia, facial/hand weakness/numbness, and dysphagia.  Out of window for TNK at the time of arrival.  Blood pressure 231/94 on arrival but remainder of vital signs stable.  Labs notable for hemoglobin 12.1 (at baseline), sodium 134, potassium 6.4, chloride 94, bicarb 24, ethanol level <15, troponin 45.  EKG without acute ischemic changes.  Brain MRI showing acute left posterior MCA territory infarct.  Patient was given IV hydralazine , IV labetalol , and IV Ativan  in the ED.  Also given insulin /dextrose , calcium  gluconate, albuterol , sodium bicarb, and Lokelma  for hyperkalemia.    Admitted by [facility].  Received hemodialysis today. Evaluated by neurology, stroke workup ongoing.  Subjective: Patient seen in dialysis.  He is not in any acute distress.  Blood pressures have been elevated today.  Denies chest pain shortness of breath.  Able to move all extremities equally.   Assessment & Plan:   Principal Problem:   Acute CVA (cerebrovascular accident) [facility]) Active Problems:   Type 2 diabetes mellitus with other diabetic kidney complication (HCC)   Elevated troponin   Hyperkalemia   Hypertensive emergency   Assessment and Plan   Acute CVA L MCA territory stroke per MRI.  CT angiogram shows left M2 MCA occlusion, severe PCA P2 stenosis. - Evaluated by neurology.  They have recommended aspirin  and Plavix  for 3 weeks followed by aspirin  alone. - Check lipid panel, hemoglobin A1c, echo, PT OT, SLP - Statin   Hyperkalemia ESRD on HD TTS Potassium 6.4 on presentation.  Medically treated in the ER, hemodialysis  today.  MWF schedule outpatient  Hypertensive emergency In the setting of medication nonadherence.  SBP initially in the 230s, improved to 180s after IV hydralazine  and IV labetalol  in the ED. Given acute CVA, allow permissive hypertension for 24 hours.  On Coreg  and amlodipine  at home, will resume  Elevated troponin, questionable chest pain: Troponin elevated 45 and 49 in the setting of ESRD  Will continue to monitor in telemetry. Dual antiplatelet therapy for acute stroke.  chronic HFpEF Last echo done in January 2020 showing EF 50 to 55%, mild mitral regurgitation, mild tricuspid regurgitation.  Repeat echo ordered in the setting of acute CVA as above.  Patient mildly hypoxic in the ER, currently off supplemental oxygen  Type 2 diabetes Poorly controlled -hemoglobin A1c 10.2 on 02/29/2024.  Repeat A1c pending.  Continue sliding scale insulin  ACHS.  Anemia of chronic disease Hemoglobin stable.   Hyperlipidemia PAD Resume home medications as appropriate once reconciled  DVT prophylaxis: SQ Heparin  Code Status: Full Code  Family Communication: None at the bedside Consults called: Nephrology, neurology Level of care: Progressive Care Unit Admission status: It is my clinical opinion that admission to INPATIENT is reasonable and necessary because of the expectation that this patient will require hospital care that crosses at least 2 midnights to treat this condition based on the medical complexity of the problems presented.  Given the aforementioned information, the predictability of an adverse outcome is felt to be significant.     Objective: Vitals:   08/07/24 1130 08/07/24 1202 08/07/24 1208 08/07/24 1331  BP: (!) 196/103 (!) 189/103 (!) 205/94 (!) 199/100  Pulse:  92 94 94 95  Resp: 12 17 17 16   Temp:  98.2 F (36.8 C) 97.7 F (36.5 C) 98 F (36.7 C)  TempSrc:    Oral  SpO2: 97% 99% 96% 96%  Weight:      Height:        Intake/Output Summary (Last 24 hours) at 08/07/2024  1412 Last data filed at 08/07/2024 1208 Gross per 24 hour  Intake 75 ml  Output 2000 ml  Net -1925 ml   Filed Weights   08/06/24 2328  Weight: 66.2 kg    Physical Exam Vitals reviewed.  Constitutional:      General: He is not in acute distress. HENT:     Head: Normocephalic and atraumatic.  Eyes:     Extraocular Movements: Extraocular movements intact.  Cardiovascular:     Rate and Rhythm: Normal rate and regular rhythm.  Pulmonary:     Effort: Pulmonary effort is normal. No respiratory distress.     Breath sounds: Normal breath sounds.  Abdominal:     General: Bowel sounds are normal.     Palpations: Abdomen is soft.     Tenderness: There is no abdominal tenderness. There is no guarding.  Musculoskeletal:     Cervical back: Normal range of motion.     Right lower leg: No edema.     Left lower leg: No edema.  Skin:    General: Skin is warm and dry.  Neurological: Language barrier?  Aphasia    Alert,  cranial nerves grossly intact, moves all extremities equally.  Data Reviewed: I have personally reviewed following labs and imaging studies  CBC: Recent Labs  Lab 08/06/24 1727 08/06/24 1746  WBC 8.8  --   NEUTROABS 5.9  --   HGB 12.1* 12.9*  HCT 37.3* 38.0*  MCV 97.9  --   PLT 162  --    Basic Metabolic Panel: Recent Labs  Lab 08/06/24 1727 08/06/24 1746 08/07/24 0227  NA 134* 135 136  K 6.4* 6.2* 5.7*  CL 94* 98 95*  CO2 24  --  22  GLUCOSE 171* 165* 134*  BUN 50* 47* 58*  CREATININE 10.60* 11.70* 11.50*  CALCIUM  8.2*  --  7.9*   GFR: Estimated Creatinine Clearance: 6.1 mL/min (A) (by C-G formula based on SCr of 11.5 mg/dL (H)). Liver Function Tests: Recent Labs  Lab 08/06/24 1727  AST 13*  ALT 7  ALKPHOS 80  BILITOT 0.3  PROT 7.6  ALBUMIN 4.1   No results for input(s): LIPASE, AMYLASE in the last 168 hours. No results for input(s): AMMONIA in the last 168 hours. Coagulation Profile: Recent Labs  Lab 08/06/24 1727  INR 1.0    Cardiac Enzymes: No results for input(s): CKTOTAL, CKMB, CKMBINDEX, TROPONINI in the last 168 hours. BNP (last 3 results) No results for input(s): PROBNP in the last 8760 hours. HbA1C: Recent Labs    08/07/24 0227  HGBA1C 8.5*   CBG: Recent Labs  Lab 08/06/24 1727 08/06/24 2018 08/07/24 1308  GLUCAP 168* 109* 116*   Lipid Profile: Recent Labs    08/07/24 0227  CHOL 189  HDL 33*  LDLCALC 122*  TRIG 169*  CHOLHDL 5.7   Thyroid Function Tests: No results for input(s): TSH, T4TOTAL, FREET4, T3FREE, THYROIDAB in the last 72 hours. Anemia Panel: No results for input(s): VITAMINB12, FOLATE, FERRITIN, TIBC, IRON , RETICCTPCT in the last 72 hours. Sepsis Labs: No results for input(s): PROCALCITON, LATICACIDVEN in the last 168 hours.  No results found for  this or any previous visit (from the past 240 hours).       Radiology Studies: CT VENOGRAM HEAD Result Date: 08/07/2024 EXAM: CT VENOGRAM WITH CONTRAST 08/07/2024 03:42:47 AM TECHNIQUE: CT venogram of the head/brain was performed with the administration of intravenous contrast. Multiplanar reformatted images are provided for review. MIP images are provided for review. Automated exposure control, iterative reconstruction, and/or weight based adjustment of the mA/kV was utilized to reduce the radiation dose to as low as reasonably achievable. COMPARISON: None available. CLINICAL HISTORY: Dural venous sinus thrombosis suspected FINDINGS: No dural venous sinus thrombosis. No significant stenosis. IMPRESSION: 1. No dural venous sinus thrombosis. Electronically signed by: Gilmore Molt 08/07/2024 04:03 AM EST RP Workstation: [id number]   CT ANGIO HEAD NECK W WO CM Result Date: 08/07/2024 EXAM: CTA HEAD AND NECK WITH AND WITHOUT 08/07/2024 03:42:47 AM TECHNIQUE: CTA of the head and neck was performed with and without the administration of intravenous contrast. Multiplanar 2D and/or 3D reformatted  images are provided for review. Automated exposure control, iterative reconstruction, and/or weight based adjustment of the mA/kV was utilized to reduce the radiation dose to as low as reasonably achievable. Stenosis of the internal carotid arteries measured using NASCET criteria. COMPARISON: None available CLINICAL HISTORY: Neuro deficit, acute, stroke suspected Neuro deficit, acute, stroke suspected FINDINGS: AORTIC ARCH AND ARCH VESSELS: No dissection or arterial injury. No significant stenosis of the brachiocephalic or subclavian arteries. CERVICAL CAROTID ARTERIES: No dissection, arterial injury, or hemodynamically significant stenosis by NASCET criteria. CERVICAL VERTEBRAL ARTERIES: No dissection, arterial injury, or significant stenosis. LUNGS AND MEDIASTINUM: Unremarkable. SOFT TISSUES: No acute abnormality. BONES: No acute abnormality. ANTERIOR CIRCULATION: No significant stenosis of the internal carotid arteries. No significant stenosis of the anterior cerebral arteries. Occluded proximal left M2 MCA branch. No aneurysm. POSTERIOR CIRCULATION: PCAs are patent. Severe bilateral P2 PCA stenosis. No significant stenosis of the basilar artery. Vertebral arteries are patent. Moderate right intradural vertebral artery stenosis. No aneurysm. OTHER: No dural venous sinus thrombosis on this non-dedicated study. Findings discussed with Dr. Khaliqdina via telephone at 3:55 AM. IMPRESSION: 1. Occluded proximal left M2 MCA branch. 2. Severe bilateral P2 PCA stenosis. 3. Moderate right intradural vertebral artery stenosis. Electronically signed by: Gilmore Molt 08/07/2024 04:00 AM EST RP Workstation: [id number]   DG CHEST PORT 1 VIEW Result Date: 08/07/2024 EXAM: 1 VIEW(S) XRAY OF THE CHEST 08/07/2024 02:24:13 AM COMPARISON: 11/17/2021 CLINICAL HISTORY: Hypoxia 200808 Hypoxia FINDINGS: LUNGS AND PLEURA: Low lung volumes. No focal pulmonary opacity. No pleural effusion. No pneumothorax. HEART AND MEDIASTINUM:  Cardiomegaly with vascular congestion. BONES AND SOFT TISSUES: No acute osseous abnormality. IMPRESSION: 1. Cardiomegaly with vascular congestion. 2. Low lung volumes. Electronically signed by: Franky Crease MD 08/07/2024 02:26 AM EST RP Workstation: [id number]   MR BRAIN WO CONTRAST Result Date: 08/06/2024 EXAM: MRI BRAIN WITHOUT CONTRAST 08/06/2024 09:36:57 PM TECHNIQUE: Multiplanar multisequence MRI of the head/brain was performed without the administration of intravenous contrast. COMPARISON: CT head earlier today. CLINICAL HISTORY: Neuro deficit, acute, stroke suspected FINDINGS: BRAIN AND VENTRICLES: Acute left posterior MCA territory infarct involving the left anterior temporal lobe, overlying parietal lobe and posterior left insula. Edema without mass effect. Numerous punctate foci of susceptibility artifact throughout the brain. No intracranial hemorrhage. No mass. No midline shift. No hydrocephalus. The sella is unremarkable. Normal flow voids. ORBITS: No acute abnormality. SINUSES AND MASTOIDS: No acute abnormality. BONES AND SOFT TISSUES: Normal marrow signal. No acute soft tissue abnormality. IMPRESSION: 1. Acute left posterior MCA territory infarct. 2. Numerous punctate  foci of susceptibility artifact throughout the brain, possibly prior micro emboli. Electronically signed by: Gilmore Molt 08/06/2024 09:54 PM EST RP Workstation: [id number]   CT HEAD WO CONTRAST Result Date: 08/06/2024 CLINICAL DATA:  Altered aphasia EXAM: CT HEAD WITHOUT CONTRAST TECHNIQUE: Contiguous axial images were obtained from the base of the skull through the vertex without intravenous contrast. RADIATION DOSE REDUCTION: This exam was performed according to the departmental dose-optimization program which includes automated exposure control, adjustment of the mA and/or kV according to patient size and/or use of iterative reconstruction technique. COMPARISON:  CT brain 11/17/2021 FINDINGS: Brain: Focal cortical and  subcortical hypodensity at the left temporoparietal lobe. No hemorrhage. No mass. No significant mass effect or midline shift. The ventricles are nonenlarged Vascular: No hyperdense vessels. Carotid and vertebral calcification. Extensive small vessel calcification. Skull: No fracture Sinuses/Orbits: No acute finding. Other: None IMPRESSION: Focal cortical and subcortical hypodensity at the left temporoparietal lobe, suspicious for acute to subacute infarct. No hemorrhage. No significant mass effect or midline shift. Electronically Signed   By: Luke Bun M.D.   On: 08/06/2024 18:09        Scheduled Meds:  [START ON 08/08/2024]  stroke: early stages of recovery book   Does not apply Once   amLODipine   10 mg Oral Daily   aspirin  EC  81 mg Oral Daily   carvedilol   12.5 mg Oral BID   Chlorhexidine  Gluconate Cloth  6 each Topical Q0600   [START ON 08/08/2024] clopidogrel   75 mg Oral Daily   heparin   5,000 Units Subcutaneous Q8H   insulin  aspart  0-5 Units Subcutaneous QHS   insulin  aspart  0-6 Units Subcutaneous TID WC   rosuvastatin   10 mg Oral Daily   Continuous Infusions:        Kimberlynn Lumbra, MD [facility] 08/07/2024, 2:12 PM   "

## 2024-08-07 NOTE — Progress Notes (Addendum)
 STROKE TEAM PROGRESS NOTE   SUBJECTIVE (INTERVAL HISTORY) His RN is at the bedside.  Pt RUE seems much weaker than yesterday, also painful on forced movement, especially at R shoulder, will check R shoulder X-ray and repeat MRI brain. His BP has been stable.    OBJECTIVE Temp:  [97.7 F (36.5 C)-98.5 F (36.9 C)] 98 F (36.7 C) (01/05 2002) Pulse Rate:  [76-98] 77 (01/05 2002) Cardiac Rhythm: Normal sinus rhythm (01/05 1900) Resp:  [10-23] 17 (01/05 2002) BP: (92-323)/(55-123) 166/81 (01/05 2002) SpO2:  [89 %-100 %] 100 % (01/05 2002) Weight:  [66.2 kg] 66.2 kg (01/04 2328)  Recent Labs  Lab 08/06/24 2018 08/07/24 1308 08/07/24 1711 08/07/24 1828 08/07/24 2139  GLUCAP 109* 116* 165* 150* 184*   Recent Labs  Lab 08/06/24 1727 08/06/24 1746 08/07/24 0227  NA 134* 135 136  K 6.4* 6.2* 5.7*  CL 94* 98 95*  CO2 24  --  22  GLUCOSE 171* 165* 134*  BUN 50* 47* 58*  CREATININE 10.60* 11.70* 11.50*  CALCIUM  8.2*  --  7.9*   Recent Labs  Lab 08/06/24 1727  AST 13*  ALT 7  ALKPHOS 80  BILITOT 0.3  PROT 7.6  ALBUMIN 4.1   Recent Labs  Lab 08/06/24 1727 08/06/24 1746  WBC 8.8  --   NEUTROABS 5.9  --   HGB 12.1* 12.9*  HCT 37.3* 38.0*  MCV 97.9  --   PLT 162  --    No results for input(s): CKTOTAL, CKMB, CKMBINDEX, TROPONINI in the last 168 hours. Recent Labs    08/06/24 1727  LABPROT 13.9  INR 1.0   No results for input(s): COLORURINE, LABSPEC, PHURINE, GLUCOSEU, HGBUR, BILIRUBINUR, KETONESUR, PROTEINUR, UROBILINOGEN, NITRITE, LEUKOCYTESUR in the last 72 hours.  Invalid input(s): APPERANCEUR     Component Value Date/Time   CHOL 189 08/07/2024 0227   CHOL 248 (H) 02/29/2024 1119   TRIG 169 (H) 08/07/2024 0227   HDL 33 (L) 08/07/2024 0227   HDL 30 (L) 02/29/2024 1119   CHOLHDL 5.7 08/07/2024 0227   VLDL 34 08/07/2024 0227   LDLCALC 122 (H) 08/07/2024 0227   LDLCALC 174 (H) 02/29/2024 1119   Lab Results  Component  Value Date   HGBA1C 8.5 (H) 08/07/2024   No results found for: LABOPIA, COCAINSCRNUR, LABBENZ, AMPHETMU, THCU, LABBARB  Recent Labs  Lab 08/06/24 1727  ETH <15    I have personally reviewed the radiological images below and agree with the radiology interpretations.  ECHOCARDIOGRAM COMPLETE Result Date: 08/07/2024    ECHOCARDIOGRAM REPORT   Patient Name:   Blake Santana Date of Exam: 08/07/2024 Medical Rec #:  [id number]                Height:       62.0 in Accession #:    [id number]               Weight:       146.0 lb Date of Birth:  Apr 11, 1971                BSA:          1.672 m Patient Age:    53 years                 BP:           205/100 mmHg Patient Gender: M  HR:           93 bpm. Exam Location:  Inpatient Procedure: 2D Echo, Cardiac Doppler and Color Doppler (Both Spectral and Color            Flow Doppler were utilized during procedure). Indications:    Stroke I63.9  History:        Patient has prior history of Echocardiogram examinations, most                 recent 04/03/2017. Risk Factors:Hypertension.  Sonographer:    Nathanel Devonshire Referring Phys: EDITHA RAM IMPRESSIONS  1. Left ventricular ejection fraction, by estimation, is 60 to 65%. The left ventricle has normal function. The left ventricle has no regional wall motion abnormalities. There is mild left ventricular hypertrophy. Left ventricular diastolic parameters are indeterminate.  2. Right ventricular systolic function is normal. The right ventricular size is normal.  3. The mitral valve is normal in structure. Trivial mitral valve regurgitation. No evidence of mitral stenosis.  4. The aortic valve is normal in structure. Aortic valve regurgitation is not visualized. No aortic stenosis is present.  5. The inferior vena cava is normal in size with greater than 50% respiratory variability, suggesting right atrial pressure of 3 mmHg. Conclusion(s)/Recommendation(s): No intracardiac source of  embolism detected on this transthoracic study. Consider a transesophageal echocardiogram to exclude cardiac source of embolism if clinically indicated. FINDINGS  Left Ventricle: Left ventricular ejection fraction, by estimation, is 60 to 65%. The left ventricle has normal function. The left ventricle has no regional wall motion abnormalities. The left ventricular internal cavity size was normal in size. There is  mild left ventricular hypertrophy. Abnormal (paradoxical) septal motion, consistent with left bundle branch block. Left ventricular diastolic parameters are indeterminate. Right Ventricle: The right ventricular size is normal. No increase in right ventricular wall thickness. Right ventricular systolic function is normal. Left Atrium: Left atrial size was normal in size. Right Atrium: Right atrial size was normal in size. Pericardium: There is no evidence of pericardial effusion. Mitral Valve: The mitral valve is normal in structure. Trivial mitral valve regurgitation. No evidence of mitral valve stenosis. Tricuspid Valve: The tricuspid valve is normal in structure. Tricuspid valve regurgitation is not demonstrated. No evidence of tricuspid stenosis. Aortic Valve: The aortic valve is normal in structure. Aortic valve regurgitation is not visualized. No aortic stenosis is present. Aortic valve mean gradient measures 5.0 mmHg. Aortic valve peak gradient measures 10.0 mmHg. Aortic valve area, by VTI measures 1.60 cm. Pulmonic Valve: The pulmonic valve was normal in structure. Pulmonic valve regurgitation is trivial. No evidence of pulmonic stenosis. Aorta: The aortic root is normal in size and structure. Venous: The inferior vena cava is normal in size with greater than 50% respiratory variability, suggesting right atrial pressure of 3 mmHg. IAS/Shunts: No atrial level shunt detected by color flow Doppler.  LEFT VENTRICLE PLAX 2D LVIDd:         4.50 cm LVIDs:         3.00 cm LV PW:         1.30 cm LV IVS:         1.30 cm LVOT diam:     1.80 cm LV SV:         47 LV SV Index:   28 LVOT Area:     2.54 cm  LV Volumes (MOD) LV vol d, MOD A2C: 71.3 ml LV vol d, MOD A4C: 83.2 ml LV vol s, MOD A2C: 27.2 ml  LV vol s, MOD A4C: 32.5 ml LV SV MOD A2C:     44.1 ml LV SV MOD A4C:     83.2 ml LV SV MOD BP:      49.1 ml RIGHT VENTRICLE RV Basal diam:  2.80 cm RV S prime:     15.00 cm/s TAPSE (M-mode): 1.9 cm LEFT ATRIUM             Index        RIGHT ATRIUM           Index LA diam:        3.10 cm 1.85 cm/m   RA Area:     12.20 cm LA Vol (A2C):   40.9 ml 24.46 ml/m  RA Volume:   25.70 ml  15.37 ml/m LA Vol (A4C):   52.9 ml 31.63 ml/m LA Biplane Vol: 47.5 ml 28.40 ml/m  AORTIC VALVE                    PULMONIC VALVE AV Area (Vmax):    1.69 cm     PV Vmax:       1.17 m/s AV Area (Vmean):   1.47 cm     PV Peak grad:  5.5 mmHg AV Area (VTI):     1.60 cm AV Vmax:           158.00 cm/s AV Vmean:          99.300 cm/s AV VTI:            0.293 m AV Peak Grad:      10.0 mmHg AV Mean Grad:      5.0 mmHg LVOT Vmax:         105.00 cm/s LVOT Vmean:        57.200 cm/s LVOT VTI:          0.184 m LVOT/AV VTI ratio: 0.63  AORTA Ao Root diam: 2.70 cm Ao Asc diam:  2.90 cm  SHUNTS Systemic VTI:  0.18 m Systemic Diam: 1.80 cm Oneil Parchment MD Electronically signed by Oneil Parchment MD Signature Date/Time: 08/07/2024/4:34:40 PM    Final    CT VENOGRAM HEAD Result Date: 08/07/2024 EXAM: CT VENOGRAM WITH CONTRAST 08/07/2024 03:42:47 AM TECHNIQUE: CT venogram of the head/brain was performed with the administration of intravenous contrast. Multiplanar reformatted images are provided for review. MIP images are provided for review. Automated exposure control, iterative reconstruction, and/or weight based adjustment of the mA/kV was utilized to reduce the radiation dose to as low as reasonably achievable. COMPARISON: None available. CLINICAL HISTORY: Dural venous sinus thrombosis suspected FINDINGS: No dural venous sinus thrombosis. No significant stenosis.  IMPRESSION: 1. No dural venous sinus thrombosis. Electronically signed by: Gilmore Molt 08/07/2024 04:03 AM EST RP Workstation: [id number]   CT ANGIO HEAD NECK W WO CM Result Date: 08/07/2024 EXAM: CTA HEAD AND NECK WITH AND WITHOUT 08/07/2024 03:42:47 AM TECHNIQUE: CTA of the head and neck was performed with and without the administration of intravenous contrast. Multiplanar 2D and/or 3D reformatted images are provided for review. Automated exposure control, iterative reconstruction, and/or weight based adjustment of the mA/kV was utilized to reduce the radiation dose to as low as reasonably achievable. Stenosis of the internal carotid arteries measured using NASCET criteria. COMPARISON: None available CLINICAL HISTORY: Neuro deficit, acute, stroke suspected Neuro deficit, acute, stroke suspected FINDINGS: AORTIC ARCH AND ARCH VESSELS: No dissection or arterial injury. No significant stenosis of the brachiocephalic or subclavian arteries. CERVICAL CAROTID ARTERIES: No dissection,  arterial injury, or hemodynamically significant stenosis by NASCET criteria. CERVICAL VERTEBRAL ARTERIES: No dissection, arterial injury, or significant stenosis. LUNGS AND MEDIASTINUM: Unremarkable. SOFT TISSUES: No acute abnormality. BONES: No acute abnormality. ANTERIOR CIRCULATION: No significant stenosis of the internal carotid arteries. No significant stenosis of the anterior cerebral arteries. Occluded proximal left M2 MCA branch. No aneurysm. POSTERIOR CIRCULATION: PCAs are patent. Severe bilateral P2 PCA stenosis. No significant stenosis of the basilar artery. Vertebral arteries are patent. Moderate right intradural vertebral artery stenosis. No aneurysm. OTHER: No dural venous sinus thrombosis on this non-dedicated study. Findings discussed with Dr. Khaliqdina via telephone at 3:55 AM. IMPRESSION: 1. Occluded proximal left M2 MCA branch. 2. Severe bilateral P2 PCA stenosis. 3. Moderate right intradural vertebral artery  stenosis. Electronically signed by: Gilmore Molt 08/07/2024 04:00 AM EST RP Workstation: [id number]   DG CHEST PORT 1 VIEW Result Date: 08/07/2024 EXAM: 1 VIEW(S) XRAY OF THE CHEST 08/07/2024 02:24:13 AM COMPARISON: 11/17/2021 CLINICAL HISTORY: Hypoxia 200808 Hypoxia FINDINGS: LUNGS AND PLEURA: Low lung volumes. No focal pulmonary opacity. No pleural effusion. No pneumothorax. HEART AND MEDIASTINUM: Cardiomegaly with vascular congestion. BONES AND SOFT TISSUES: No acute osseous abnormality. IMPRESSION: 1. Cardiomegaly with vascular congestion. 2. Low lung volumes. Electronically signed by: Franky Crease MD 08/07/2024 02:26 AM EST RP Workstation: [id number]   MR BRAIN WO CONTRAST Result Date: 08/06/2024 EXAM: MRI BRAIN WITHOUT CONTRAST 08/06/2024 09:36:57 PM TECHNIQUE: Multiplanar multisequence MRI of the head/brain was performed without the administration of intravenous contrast. COMPARISON: CT head earlier today. CLINICAL HISTORY: Neuro deficit, acute, stroke suspected FINDINGS: BRAIN AND VENTRICLES: Acute left posterior MCA territory infarct involving the left anterior temporal lobe, overlying parietal lobe and posterior left insula. Edema without mass effect. Numerous punctate foci of susceptibility artifact throughout the brain. No intracranial hemorrhage. No mass. No midline shift. No hydrocephalus. The sella is unremarkable. Normal flow voids. ORBITS: No acute abnormality. SINUSES AND MASTOIDS: No acute abnormality. BONES AND SOFT TISSUES: Normal marrow signal. No acute soft tissue abnormality. IMPRESSION: 1. Acute left posterior MCA territory infarct. 2. Numerous punctate foci of susceptibility artifact throughout the brain, possibly prior micro emboli. Electronically signed by: Gilmore Molt 08/06/2024 09:54 PM EST RP Workstation: [id number]   CT HEAD WO CONTRAST Result Date: 08/06/2024 CLINICAL DATA:  Altered aphasia EXAM: CT HEAD WITHOUT CONTRAST TECHNIQUE: Contiguous axial images were obtained  from the base of the skull through the vertex without intravenous contrast. RADIATION DOSE REDUCTION: This exam was performed according to the departmental dose-optimization program which includes automated exposure control, adjustment of the mA and/or kV according to patient size and/or use of iterative reconstruction technique. COMPARISON:  CT brain 11/17/2021 FINDINGS: Brain: Focal cortical and subcortical hypodensity at the left temporoparietal lobe. No hemorrhage. No mass. No significant mass effect or midline shift. The ventricles are nonenlarged Vascular: No hyperdense vessels. Carotid and vertebral calcification. Extensive small vessel calcification. Skull: No fracture Sinuses/Orbits: No acute finding. Other: None IMPRESSION: Focal cortical and subcortical hypodensity at the left temporoparietal lobe, suspicious for acute to subacute infarct. No hemorrhage. No significant mass effect or midline shift. Electronically Signed   By: Luke Bun M.D.   On: 08/06/2024 18:09     PHYSICAL EXAM  Temp:  [97.7 F (36.5 C)-98.5 F (36.9 C)] 98 F (36.7 C) (01/05 2002) Pulse Rate:  [76-98] 77 (01/05 2002) Resp:  [10-23] 17 (01/05 2002) BP: (92-323)/(55-123) 166/81 (01/05 2002) SpO2:  [89 %-100 %] 100 % (01/05 2002) Weight:  [66.2 kg] 66.2 kg (01/04 2328)  General -  Well nourished, well developed, in mild acute distress from R shoulder pain.  Ophthalmologic - fundi not visualized due to noncooperation.  Cardiovascular - Regular rhythm and rate.  Neuro - Pt is awake, alert, eyes open, global aphasia, only mumbles without meaningful words. Not following commands. No gaze palsy, able to track bilaterally but incomplete on both directions. Not blinking to visual threat bilaterally, PERRL. R mild facial droop. Tongue protrusion not cooperative. RUE 3-/5 proximal and distally. Bilaterally LEs and LUE 5/5, no drift. Sensation, coordination not cooperative and gait not tested.    ASSESSMENT/PLAN Blake Santana is a 54 y.o. male with history of ESRD on HD, hypertension, hyperlipidemia, diabetes with retinopathy, PAD admitted for global aphasia. No TNK given due to outside window.    Stroke:  left temporoparietal acute infarct, likely secondary to iCAD versus cardioembolic source CT left temporoparietal acute infarct CTA head and neck left M2 occlusion, severe bilateral P2 stenosis, right V4 moderate stenosis CTV negative MRI Acute left posterior MCA territory infarct. Numerous punctate foci of susceptibility artifact throughout the brain, possibly prior micro emboli. Limited MRI repeat pending MRA pending 2D Echo EF 60 to 65% LE venous Doppler pending Will consider TEE and possible loop recorder. LDL 122 HgbA1c 8.5 Heparin  subcu for VTE prophylaxis No antithrombotic prior to admission, now on aspirin  81 mg daily and clopidogrel  75 mg daily DAPT for 3 months and then aspirin  alone given M2 occlusion Ongoing aggressive stroke risk factor management Therapy recommendations: CIR Disposition: Pending  Diabetes HgbA1c 8.5, goal < 7.0 Uncontrolled CBG monitoring SSI DM education and close PCP follow up  Hypertension Unstable on the high end Hold off BP meds for now Permissive hypertension for now given the worsening symptoms  Hyperlipidemia Home meds: Crestor  10 LDL 122, goal < 70 Now on Crestor  10 which is the highest dose for ESRD patient Continue statin at discharge  Other Stroke Risk Factors PAD  Other Active Problems ESRD on dialysis R shoulder pain - X-ray pending  Hospital day # 1  Patient condition worsened within the last 24 hours, has developed RUE weakness and R facial droop, and I have ordered MRI repeat. I discussed with Dr. Maranda. I spent extensive total face-to-face time with the patient, reviewing test results, images and medication, and discussing the diagnosis, treatment plan and potential prognosis. This patient's care requiresreview of  multiple databases, neurological assessment, discussion with other specialists and medical decision making of high complexity.  Ary Cummins, MD PhD Stroke Neurology 08/08/2024 03:55 PM    To contact Stroke Continuity provider, please refer to [email or web address]. After hours, contact General Neurology

## 2024-08-08 ENCOUNTER — Inpatient Hospital Stay (HOSPITAL_COMMUNITY): Payer: MEDICAID

## 2024-08-08 ENCOUNTER — Encounter (HOSPITAL_COMMUNITY): Payer: Self-pay

## 2024-08-08 DIAGNOSIS — I7090 Unspecified atherosclerosis: Secondary | ICD-10-CM

## 2024-08-08 LAB — CBC
HCT: 34.8 % — ABNORMAL LOW (ref 39.0–52.0)
Hemoglobin: 11.5 g/dL — ABNORMAL LOW (ref 13.0–17.0)
MCH: 31.3 pg (ref 26.0–34.0)
MCHC: 33 g/dL (ref 30.0–36.0)
MCV: 94.6 fL (ref 80.0–100.0)
Platelets: 152 K/uL (ref 150–400)
RBC: 3.68 MIL/uL — ABNORMAL LOW (ref 4.22–5.81)
RDW: 13.3 % (ref 11.5–15.5)
WBC: 8.9 K/uL (ref 4.0–10.5)
nRBC: 0 % (ref 0.0–0.2)

## 2024-08-08 LAB — BASIC METABOLIC PANEL WITH GFR
Anion gap: 15 (ref 5–15)
BUN: 39 mg/dL — ABNORMAL HIGH (ref 6–20)
CO2: 26 mmol/L (ref 22–32)
Calcium: 7.7 mg/dL — ABNORMAL LOW (ref 8.9–10.3)
Chloride: 90 mmol/L — ABNORMAL LOW (ref 98–111)
Creatinine, Ser: 9.05 mg/dL — ABNORMAL HIGH (ref 0.61–1.24)
GFR, Estimated: 6 mL/min — ABNORMAL LOW
Glucose, Bld: 143 mg/dL — ABNORMAL HIGH (ref 70–99)
Potassium: 5.4 mmol/L — ABNORMAL HIGH (ref 3.5–5.1)
Sodium: 131 mmol/L — ABNORMAL LOW (ref 135–145)

## 2024-08-08 LAB — GLUCOSE, CAPILLARY
Glucose-Capillary: 131 mg/dL — ABNORMAL HIGH (ref 70–99)
Glucose-Capillary: 139 mg/dL — ABNORMAL HIGH (ref 70–99)
Glucose-Capillary: 161 mg/dL — ABNORMAL HIGH (ref 70–99)
Glucose-Capillary: 196 mg/dL — ABNORMAL HIGH (ref 70–99)

## 2024-08-08 LAB — RENAL FUNCTION PANEL
Albumin: 3.8 g/dL (ref 3.5–5.0)
Anion gap: 14 (ref 5–15)
BUN: 35 mg/dL — ABNORMAL HIGH (ref 6–20)
CO2: 28 mmol/L (ref 22–32)
Calcium: 7.8 mg/dL — ABNORMAL LOW (ref 8.9–10.3)
Chloride: 91 mmol/L — ABNORMAL LOW (ref 98–111)
Creatinine, Ser: 7.95 mg/dL — ABNORMAL HIGH (ref 0.61–1.24)
GFR, Estimated: 7 mL/min — ABNORMAL LOW
Glucose, Bld: 147 mg/dL — ABNORMAL HIGH (ref 70–99)
Phosphorus: 5.9 mg/dL — ABNORMAL HIGH (ref 2.5–4.6)
Potassium: 5.7 mmol/L — ABNORMAL HIGH (ref 3.5–5.1)
Sodium: 133 mmol/L — ABNORMAL LOW (ref 135–145)

## 2024-08-08 MED ORDER — HEPARIN SODIUM (PORCINE) 1000 UNIT/ML DIALYSIS: 1000.0000 [IU] | INTRAMUSCULAR | Status: DC | PRN

## 2024-08-08 MED ORDER — LIDOCAINE HCL (PF) 1 % IJ SOLN: 5.0000 mL | INTRAMUSCULAR | Status: DC | PRN

## 2024-08-08 MED ORDER — SODIUM ZIRCONIUM CYCLOSILICATE 10 G PO PACK
10.0000 g | PACK | Freq: Three times a day (TID) | ORAL | Status: AC
  Administered 2024-08-08 (×2): 10 g via ORAL
  Filled 2024-08-08 (×2): qty 1

## 2024-08-08 MED ORDER — ALTEPLASE 2 MG IJ SOLR: 2.0000 mg | Freq: Once | INTRAMUSCULAR | Status: DC | PRN

## 2024-08-08 MED ORDER — PENTAFLUOROPROP-TETRAFLUOROETH EX AERO: 1.0000 | INHALATION_SPRAY | CUTANEOUS | Status: DC | PRN

## 2024-08-08 MED ORDER — HEPARIN SODIUM (PORCINE) 1000 UNIT/ML DIALYSIS: 2000.0000 [IU] | Freq: Once | INTRAMUSCULAR | Status: DC

## 2024-08-08 MED ORDER — NEPRO/CARBSTEADY PO LIQD: 237.0000 mL | ORAL | Status: DC | PRN

## 2024-08-08 MED ORDER — LIDOCAINE-PRILOCAINE 2.5-2.5 % EX CREA: 1.0000 | TOPICAL_CREAM | CUTANEOUS | Status: DC | PRN

## 2024-08-08 MED ORDER — ANTICOAGULANT SODIUM CITRATE 4% (200MG/5ML) IV SOLN: 5.0000 mL | Status: DC | PRN

## 2024-08-08 MED ORDER — CHLORHEXIDINE GLUCONATE CLOTH 2 % EX PADS
6.0000 | MEDICATED_PAD | Freq: Every day | CUTANEOUS | Status: DC
  Administered 2024-08-09 – 2024-08-10 (×2): 6 via TOPICAL

## 2024-08-08 MED ORDER — HEPARIN SODIUM (PORCINE) 1000 UNIT/ML DIALYSIS
2000.0000 [IU] | Freq: Once | INTRAMUSCULAR | Status: AC
  Administered 2024-08-09: 2000 [IU] via INTRAVENOUS_CENTRAL

## 2024-08-08 MED ORDER — HEPARIN SODIUM (PORCINE) 1000 UNIT/ML DIALYSIS: 1500.0000 [IU] | INTRAMUSCULAR | Status: DC | PRN

## 2024-08-08 NOTE — Progress Notes (Addendum)
  [facility] Progress Note  Subjective:  Seen in room Working w/ PT No new c/o's   Presentation summary: 54 y.o. year-old w/ PMH as below who presented to ED today complaining of trouble speaking and choking when he was eating dinner last night.  Then around noon he began having slurred speech and numbness on the left side of his face.  No extremity involvement. In ED BP 231/ 94, HR 72, RR 18, 100% on RA. Labs showed Na 134, K 6.4, bun 50, creat 10. MRI showed acute left posterior MCA territory infarct involving the left anterior temporal lobe, overlying parietal lobe and posterior left insula.  Some edema without mass effect.  We were asked to see for dialysis.   Vitals:   08/08/24 0150 08/08/24 0441 08/08/24 0723 08/08/24 1127  BP: 108/60 (!) 160/74 (!) 150/80 (!) 160/69  Pulse: 63 66 68 66  Resp: 18 18 15 15   Temp: 98.1 F (36.7 C) 98 F (36.7 C) 98.1 F (36.7 C) 97.6 F (36.4 C)  TempSrc: Oral Oral    SpO2:  95% 94% 93%  Weight:      Height:        Exam: Gen alert, no distress Sclera anicteric, throat clear  No jvd or bruits Chest clear bilat to bases RRR no MRG Abd soft ntnd no mass or ascites +bs Ext no LE or UE edema, no other edema Neuro is alert, Ox 3 , nf    LUA AVF+bruit    Home bp meds: Norvasc  Coreg     [facility] HD: [facility] 3h B350 65.2kg  AVF  Hep 2000  Last OP HD 1/02, post wt 67.2kg Hep B SAg neg 07/10/24 Heb B SAb +Immune 05/15/24     Assessment/ Plan:   # ESRD - on HD MWF - HD here yesterday - next HD Wed  # Hyperkalemia - K+ 6.4 in ED, had HD yesterday - K+ 5.7 this am, not sure accurate- repeat pending   # acute CVA  - L posterior MCA territory per MRI - aphasia - PT seeing, CIR evaluating   # HTN - permissive HTN per neurology x initial 48 hrs  - on norvasc  and coreg , SBPs 150 -170 today   # Volume - no vol excess on exam, on RA - initial CXR showed vasc congestion - 2 L off w/ HD 1/05 - RA today, no edema      # Anemia of esrd - Hb 12- 13 here, follow, no esa needs    Myer Fret MD  [facility] 08/08/2024, 1:36 PM  Recent Labs  Lab 08/06/24 1727 08/06/24 1746 08/07/24 0227 08/08/24 0156  HGB 12.1* 12.9*  --  11.5*  ALBUMIN 4.1  --   --  3.8  CALCIUM  8.2*  --  7.9* 7.8*  PHOS  --   --   --  5.9*  CREATININE 10.60* 11.70* 11.50* 7.95*  K 6.4* 6.2* 5.7* 5.7*   No results for input(s): IRON , TIBC, FERRITIN in the last 168 hours. Inpatient medications:  amLODipine   10 mg Oral Daily   aspirin  EC  81 mg Oral Daily   carvedilol   12.5 mg Oral BID   Chlorhexidine  Gluconate Cloth  6 each Topical Q0600   clopidogrel   75 mg Oral Daily   heparin   5,000 Units Subcutaneous Q8H   insulin  aspart  0-5 Units Subcutaneous QHS   insulin  aspart  0-6 Units Subcutaneous TID WC   rosuvastatin   10 mg Oral Daily  sodium zirconium cyclosilicate   10 g Oral Q8H    acetaminophen  **OR** acetaminophen , hydrALAZINE 

## 2024-08-08 NOTE — Progress Notes (Signed)
Back to unit from MRI.

## 2024-08-08 NOTE — Evaluation (Signed)
 Speech Language Pathology Evaluation Patient Details Name: Blake Santana MRN: [id number] DOB: 1971/01/17 Today's Date: 08/08/2024 Time: 8569-8544 SLP Time Calculation (min) (ACUTE ONLY): 25 min  Problem List:  Patient Active Problem List   Diagnosis Date Noted   Hypertensive emergency 08/07/2024   Acute CVA (cerebrovascular accident) (HCC) 08/06/2024   Non-English speaking patient 10/31/2022   Chronic diastolic CHF (congestive heart failure) (HCC) 02/20/2022   Fall on and from ladder, initial encounter 11/27/2021   Closed fracture of inferior pubic ramus, left, initial encounter (HCC) 11/17/2021   Fracture of distal end of tibia with fibula, left, closed, initial encounter 11/17/2021   Distal radius fracture, left 11/17/2021   Posterior dislocation of left elbow 11/17/2021   Injury resulting from fall from height 11/17/2021   Closed fracture of proximal end of right tibia 11/17/2021   Disp fx of proximal phalanx of right lesser toe(s), init 11/17/2021   Pain of joint of left ankle and foot 06/10/2021   Pruritus, unspecified 04/15/2021   Other disorders of phosphorus metabolism 12/09/2020   Personal history of COVID-19 04/05/2020   Pneumonia due to COVID-19 virus 03/30/2020   COVID-19 03/30/2020   Sepsis (HCC) 03/30/2020   Cough 03/26/2020   Allergy, unspecified, initial encounter 02/29/2020   Anaphylactic shock, unspecified, initial encounter 02/29/2020   Noncompliance 02/24/2020   Diarrhea, unspecified 03/15/2019   TB (tuberculosis) contact 11/27/2018   COVID-19 virus detected    Hypoxia    Abnormal EKG    Volume overload 09/02/2018   Pleural effusion, bilateral 09/02/2018   Atypical chest pain 09/01/2018   Acute respiratory failure with hypoxia (HCC) 09/01/2018   Community acquired pneumonia    Elevated troponin    Fever, unspecified 02/02/2018   Pain, unspecified 02/02/2018   Dependence on renal dialysis 09/21/2017   Underimmunization status 09/07/2017    Hyperkalemia 08/31/2017   Complication of vascular dialysis catheter 08/18/2017   Shortness of breath 08/18/2017   Hyperlipidemia, unspecified 08/13/2017   Coagulation defect, unspecified 08/13/2017   Encounter for screening for other viral diseases 08/13/2017   Heart failure, unspecified (HCC) 08/13/2017   Secondary hyperparathyroidism of renal origin 08/13/2017   Unspecified protein-calorie malnutrition 08/11/2017   RSV (respiratory syncytial virus infection) 08/09/2017   ESRD on hemodialysis TTS (HCC) 08/08/2017   Proliferative diabetic retinopathy with macular edema (HCC) 08/03/2017   Hypertensive retinopathy of both eyes 08/03/2017   Cataracts, bilateral 08/03/2017   Acute exacerbation of CHF (congestive heart failure) (HCC) 04/03/2017   End stage renal disease (HCC) 03/11/2017   Hypertension associated with diabetes (HCC) 03/11/2017   Hypertensive chronic kidney disease with stage 1 through stage 4 chronic kidney disease, or unspecified chronic kidney disease 03/11/2017   Edema 01/14/2017   Dyslipidemia 10/15/2016   Diabetic retinopathy (HCC) 10/15/2016   Proteinuria, unspecified 06/18/2016   Anemia in chronic kidney disease 06/18/2016   Iron  deficiency anemia, unspecified 06/18/2016   Type 2 diabetes mellitus with other diabetic kidney complication (HCC) 10/20/2001   Past Medical History:  Past Medical History:  Diagnosis Date   Acute respiratory failure with hypoxia (HCC) 09/07/2018   Anemia of chronic kidney failure 03/11/2017   Cataracts, bilateral 2019   Chest pain 09/01/2018   minimal luminal irregularities   Chronic diastolic CHF (congestive heart failure) (HCC)    Chronic kidney disease 03/11/2017   Dialysis Tu/Th/Sa   COVID 2022   hospitalized   Diabetes mellitus without complication (HCC) 1999   Edema 01/14/2017   Headache    Hypercholesterolemia    Hypertension  03/11/2017   Hypertensive retinopathy of both eyes 2019   Iron  deficiency anemia 06/18/2016    Pneumonia 09/07/2018   Proliferative diabetic retinopathy with macular edema (HCC) 2019   Retinal hemorrhage, bilateral and left vitreous hemorrhage.  Being treated with Lucentis.  Dr. Raj   RSV (respiratory syncytial virus infection) 08/2017   Past Surgical History:  Past Surgical History:  Procedure Laterality Date   A/V FISTULAGRAM Left 01/19/2018   Procedure: A/V FISTULAGRAM - Left AV;  Surgeon: Sheree Penne Bruckner, MD;  Location: [facility] INVASIVE CV LAB;  Service: Cardiovascular;  Laterality: Left;   AV FISTULA PLACEMENT Left 08/09/2017   Procedure: LEFT RADIOCEPHALIC ARTERIOVENOUS (AV) FISTULA CREATION;  Surgeon: Oris Krystal FALCON, MD;  Location: [facility] OR;  Service: Vascular;  Laterality: Left;   AV FISTULA PLACEMENT Left 02/02/2018   Procedure: ARTERIOVENOUS (AV) FISTULA CREATION LEFT UPPER EXTREMITY;  Surgeon: Oris Krystal FALCON, MD;  Location: [facility] OR;  Service: Vascular;  Laterality: Left;   INSERTION OF DIALYSIS CATHETER  08/09/2017   INSERTION OF DIALYSIS CATHETER Right 08/09/2017   Procedure: INSERTION OF DIALYSIS CATHETER RIGHT INTERNAL JUGULAR;  Surgeon: Oris Krystal FALCON, MD;  Location: [facility] OR;  Service: Vascular;  Laterality: Right;   IR DIALY SHUNT INTRO NEEDLE/INTRACATH INITIAL W/IMG LEFT Left 07/10/2020   IR DIALY SHUNT INTRO NEEDLE/INTRACATH INITIAL W/IMG LEFT Left 04/14/2021   IR REMOVAL TUN CV CATH W/O FL  07/01/2018   LEFT HEART CATH AND CORONARY ANGIOGRAPHY N/A 09/05/2018   Procedure: LEFT HEART CATH AND CORONARY ANGIOGRAPHY;  Surgeon: Dann Candyce RAMAN, MD;  Location: [facility] INVASIVE CV LAB;  Service: Cardiovascular;  Laterality: N/A;   OPEN REDUCTION INTERNAL FIXATION (ORIF) DISTAL RADIAL FRACTURE Left 11/20/2021   Procedure: POSSIBLE OPEN REDUCTION INTERNAL FIXATION (ORIF) DISTAL RADIAL FRACTURE;  Surgeon: Kendal Franky SQUIBB, MD;  Location: [facility] OR;  Service: Orthopedics;  Laterality: Left;   ORIF ANKLE FRACTURE Left 11/20/2021   Procedure: OPEN REDUCTION INTERNAL FIXATION (ORIF) ANKLE PILON  FRACTURE;  Surgeon: Kendal Franky SQUIBB, MD;  Location: [facility] OR;  Service: Orthopedics;  Laterality: Left;   ORIF TIBIA PLATEAU Right 11/20/2021   Procedure: OPEN REDUCTION INTERNAL FIXATION (ORIF) TIBIAL PLATEAU;  Surgeon: Kendal Franky SQUIBB, MD;  Location: [facility] OR;  Service: Orthopedics;  Laterality: Right;   PARATHYROIDECTOMY N/A 10/26/2022   Procedure: total PARATHYROIDECTOMY;  Surgeon: Eletha Krystal, MD;  Location: [facility] OR;  Service: General;  Laterality: N/A;   PARATHYROIDECTOMY Right 10/26/2022   Procedure: AUTOTRANSPLANT TO RIGHT FOREARM;  Surgeon: Eletha Krystal, MD;  Location: [facility] OR;  Service: General;  Laterality: Right;   HPI:  Pt is 54 y.o. male presented to [facility] on 08/07/23 for aphasia. MRI showed acute lt posterior MCA infarct. PMH - ESRD on HD, DM, Diabetic retinopahty, HTN.   Assessment / Plan / Recommendation Clinical Impression  Pt demonstrates a severe communication impairment, likely aphasia though apraxic elements also possible. Pt is able to name, repeat or read words. His verbalizations other than no or 'yes are very inaccurate to target. When shown a spoon and given word cucharra pt does have some approximation that is either paraphasic or apraxic. Pt does not follow commands with oral motor commands or identifiying body parts with a verbal, written and picture cue. He is constantly making unclear gestures and grimacing. When shown his name, he gesture 'big.' When asked about pain he grabs his jaw, but seems to have either a headache or throat pain, but cannot indicate either. PT reports pt was follow commands yesterday. Will f/u  for further interventions. Pt would benefit from intensive SLP therapy.    SLP Assessment  SLP Recommendation/Assessment: Patient needs continued Speech Language Pathology Services SLP Visit Diagnosis: Aphasia (R47.01);Apraxia (R48.2)     Assistance Recommended at Discharge  Frequent or constant Supervision/Assistance  Functional Status Assessment Patient has  had a recent decline in their functional status and demonstrates the ability to make significant improvements in function in a reasonable and predictable amount of time.  Frequency and Duration min 2x/week  2 weeks      SLP Evaluation Cognition  Overall Cognitive Status: Difficult to assess Orientation Level: Oriented to person       Comprehension  Auditory Comprehension Overall Auditory Comprehension: Impaired Yes/No Questions: Impaired Basic Biographical Questions: 0-25% accurate Commands: Impaired One Step Basic Commands: 0-24% accurate Reading Comprehension Reading Status: Impaired Word level: Impaired    Expression Verbal Expression Overall Verbal Expression: Impaired Initiation: No impairment Repetition: Impaired Level of Impairment: Word level Naming: Impairment Confrontation: Impaired   Oral / Motor  Oral Motor/Sensory Function Overall Oral Motor/Sensory Function: Mild impairment Facial ROM: Reduced right;Suspected CN VII (facial) dysfunction Motor Speech Overall Motor Speech: Impaired Respiration: Within functional limits Phonation: Normal Resonance: Within functional limits Articulation: Within functional limitis Motor Planning: Impaired Level of Impairment: Word            Kenny Stern, Consuelo Fitch 08/08/2024, 3:37 PM

## 2024-08-08 NOTE — Progress Notes (Signed)
 Pt off to unit for MRI.

## 2024-08-08 NOTE — Evaluation (Signed)
 Occupational Therapy Evaluation Patient Details Name: Blake Santana MRN: [id number] DOB: 03-10-1971 Today's Date: 08/08/2024   History of Present Illness   Pt is 54 y.o. male presented to [facility] on 08/07/23 for aphasia. MRI showed acute lt posterior MCA infarct. PMH - ESRD on HD, DM, Diabetic retinopahty, HTN.     Clinical Impressions PTA, pt lived with spouse and was mod I for BADL. Upon eval, pt presents with decreased RUE strength, coordination, initiation, safety, vision. Pt perseverative on RUE pain and decreased functional status as well as prior cues provided during functional assessment. Brushing teeth this session with mod A secondary to need for cues for visual scanning, problem solving, and sequencing task. Pt grossly needing up to max A for BADL. Pt to continue to benefit from acute OT services. Due to significant functional decline and good family support, recommending intensive multidisciplinary rehabilitation >3 hours/day to optimize safety and independence in ADL.        If plan is discharge home, recommend the following:   A lot of help with walking and/or transfers;A lot of help with bathing/dressing/bathroom;Assistance with cooking/housework;Assist for transportation;Help with stairs or ramp for entrance;Direct supervision/assist for financial management;Direct supervision/assist for medications management;Assistance with feeding     Functional Status Assessment   Patient has had a recent decline in their functional status and demonstrates the ability to make significant improvements in function in a reasonable and predictable amount of time.     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 Left seated EOB with OT.    Transfers Overall transfer level: Needs assistance Equipment used: None Transfers: Sit to/from Stand Sit to Stand: Contact guard assist           General transfer comment: for safety      Balance Overall balance assessment: Needs assistance Sitting-balance support: No upper extremity supported, Feet unsupported Sitting balance-Leahy Scale: Good     Standing balance support: No upper extremity supported, During functional activity Standing balance-Leahy Scale: Fair                             ADL either performed or assessed with clinical judgement   ADL Overall ADL's : Needs assistance/impaired Eating/Feeding: Moderate assistance;Sitting   Grooming: Moderate assistance;Standing;Oral care Grooming Details (indicate cue type and reason): multimodal cues for sequencing, no initiation functionally with RUE other than touching face (not during ADL) Upper Body Bathing: Sitting;Maximal assistance   Lower Body Bathing: Maximal assistance;Sit to/from stand   Upper Body Dressing : Maximal assistance;Sitting   Lower Body Dressing: Maximal assistance;Sit to/from stand   Toilet Transfer: Minimal assistance;Ambulation           Functional mobility during ADLs: Minimal assistance       Vision Baseline Vision/History:  (L eye blindness at baseline per chart) Vision Assessment?: Vision impaired- to be further tested in functional context Additional Comments: pt needing multimoday cues to locate items at sink     Perception  Praxis         Pertinent Vitals/Pain Pain Assessment Pain Assessment: Faces Faces Pain Scale: Hurts  whole lot Pain Location: R arm Pain Descriptors / Indicators: Grimacing, Guarding, Sore Pain Intervention(s): Limited activity within patient's tolerance, Monitored during session     Extremity/Trunk Assessment Upper Extremity Assessment Upper Extremity Assessment: RUE deficits/detail RUE Deficits / Details: pt holding RUE in pain throughout session. with guidance, AROM shoulder 0-90, grossly 2+/5 strength. poor initiation, however, occasionally does reach to face. reports he cannot feel touch but also with expressive difficulty and seems to be an inconsistent report RUE Sensation: decreased light touch   Lower Extremity Assessment Lower Extremity Assessment: Defer to PT evaluation       Communication Communication Communication: Impaired Factors Affecting Communication: Difficulty expressing self;Reduced clarity of speech;Non - English speaking, interpreter not available   Cognition Arousal: Alert Behavior During Therapy: [facility] for tasks assessed/performed, Flat affect Cognition: Difficult to assess Difficult to assess due to: Impaired communication, Non-English speaking (spanish speaking, but also with expressive and receptive language deficits)           OT - Cognition Comments: inconsistently follows one step commands, verbal responses not improved by offering yes/no questions. cues for problem solving in light of deficits                 Following commands: Impaired Following commands impaired: Follows one step commands inconsistently, Follows one step commands with increased time     Cueing  General Comments   Cueing Techniques: Verbal cues;Visual cues;Gestural cues;Tactile cues  VSS on RA   Exercises     Shoulder Instructions      Home Living Family/patient expects to be discharged to:: Private residence Living Arrangements: Spouse/significant other;Children Available Help at Discharge: Family;Available 24 hours/day Type of Home: House Home Access:  Stairs to enter [employer] of Steps: 2-3 Entrance Stairs-Rails: None Home Layout: One level     Bathroom Shower/Tub: Tub/shower unit         Home Equipment: None          Prior Functioning/Environment Prior Level of Function : Independent/Modified Independent             Mobility Comments: No assistive device ADLs Comments: independent with BADL; does not drive or cook secondary to L eye visual deficits at baseline.    OT Problem List: Decreased strength;Decreased activity tolerance;Decreased range of motion;Impaired balance (sitting and/or standing);Impaired vision/perception;Decreased coordination;Decreased cognition;Decreased safety awareness;Impaired sensation;Impaired UE functional use   OT Treatment/Interventions: Self-care/ADL training;Therapeutic exercise;DME and/or AE instruction;Neuromuscular education;Therapeutic activities;Patient/family education;Balance training;Visual/perceptual remediation/compensation;Cognitive remediation/compensation      OT Goals(Current goals can be found in the care plan section)   Acute Rehab OT Goals Patient Stated Goal: get better OT Goal Formulation: With patient Time For Goal Achievement: 08/22/24 Potential to Achieve Goals: Good   OT Frequency:  Min 2X/week    Co-evaluation              AM-PAC OT 6 Clicks Daily Activity     Outcome Measure Help from another person eating meals?: A Little Help from another person taking care of personal grooming?: A Lot Help from another person toileting, which includes using toliet, bedpan, or urinal?: A Lot Help from another person bathing (including washing, rinsing, drying)?: A Lot Help from another person to put on and taking off regular upper body clothing?: A Lot Help from another person to put on and taking off regular lower body clothing?: A Lot 6 Click Score: 13  End of Session Equipment Utilized During Treatment: Gait belt Nurse Communication: Mobility  status;Other (comment) (cont decline functionally)  Activity Tolerance: Patient tolerated treatment well Patient left: in bed;with call bell/phone within reach;with bed alarm set;with family/visitor present  OT Visit Diagnosis: Unsteadiness on feet (R26.81);Muscle weakness (generalized) (M62.81);Low vision, both eyes (H54.2);Other symptoms and signs involving cognitive function;Hemiplegia and hemiparesis;Cognitive communication deficit (R41.841) Symptoms and signs involving cognitive functions: Cerebral infarction Hemiplegia - Right/Left: Right Hemiplegia - caused by: Cerebral infarction                Time: 1040-1104 OT Time Calculation (min): 24 min Charges:  OT General Charges $OT Visit: 1 Visit OT Evaluation $OT Eval Moderate Complexity: 1 Mod  [name], OTR/L [facility] Acute Rehabilitation Office: [phone]   [name] 08/08/2024, 12:54 PM

## 2024-08-08 NOTE — Progress Notes (Signed)
 Physical Therapy Treatment Patient Details Name: Blake Santana MRN: [id number] DOB: Nov 02, 1970 Today's Date: 08/08/2024   History of Present Illness Pt is 54 year old presented to [facility] on  08/07/23 for aphasia. MRI showed acute lt posterior MCA infarct. PMH - ESRD on HD, DM, Diabetic retinopahty, HTN.    PT Comments  Pt with poor tolerance to treatment today. Pt notably more unsteady today compared to previous session requiring up to Mod A to ambulate. Pt reported to interpreter that his vision was cloudy out of his R eye and that he couldnt see anything out of L eye at baseline. Wife present and states that this appears to be worse compared to yesterday. Pt also noted with severe R arm pain which spouse reports started last night. Pt appears to have declined from a mobility perspective therefore DC recs updated to CIR. PT will continue to follow.      If plan is discharge home, recommend the following: A little help with walking and/or transfers;Help with stairs or ramp for entrance;Assist for transportation;Assistance with cooking/housework;A little help with bathing/dressing/bathroom   Can travel by private vehicle        Equipment Recommendations  None recommended by PT    Recommendations for Other Services Rehab consult     Precautions / Restrictions Precautions Precautions: Fall;Other (comment) Recall of Precautions/Restrictions: Impaired Precaution/Restrictions Comments: aphasic, 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 Restrictions Weight Bearing Restrictions Per Provider Order: No     Mobility  Bed Mobility Overal bed mobility: Needs Assistance Bed Mobility: Supine to Sit     Supine to sit: Contact guard     General bed mobility comments: No physical assistance provided however increased time. Left seated EOB with OT.    Transfers Overall transfer level: Needs assistance Equipment used: None Transfers: Sit to/from Stand Sit to Stand: Contact guard assist            General transfer comment: for safety    Ambulation/Gait Ambulation/Gait assistance: Min assist, Mod assist Gait Distance (Feet): 75 Feet Assistive device: None Gait Pattern/deviations: Step-through pattern, Decreased step length - right, Decreased step length - left, Decreased stride length, Drifts right/left Gait velocity: decr     General Gait Details: Pt notably more unsteady today compared to previous session. Pt reported to interpreter that his vision was cloudy out of his R eye and that he couldnt see anything out of L eye at baseline. Wife present and states that this appears to be worse compared to yesterday.   Stairs             Wheelchair Mobility     Tilt Bed    Modified Rankin (Stroke Patients Only)       Balance Overall balance assessment: Needs assistance Sitting-balance support: No upper extremity supported, Feet unsupported Sitting balance-Leahy Scale: Good     Standing balance support: No upper extremity supported, During functional activity Standing balance-Leahy Scale: Fair                              Hotel Manager: Impaired Factors Affecting Communication: Difficulty expressing self;Reduced clarity of speech;Non - English speaking, interpreter not available  Cognition Arousal: Alert Behavior During Therapy: WFL for tasks assessed/performed, Flat affect   PT - Cognitive impairments: Difficult to assess Difficult to assess due to: Impaired communication, Non-English speaking  PT - Cognition Comments: Pt aphasic. Spanish speaking. Used in person interpreter Blake Santana who reports that pt appeared more disoriented today. Following commands: Impaired Following commands impaired: Follows one step commands inconsistently, Follows one step commands with increased time    Cueing Cueing Techniques: Verbal cues, Visual cues, Gestural cues, Tactile cues  Exercises      General  Comments General comments (skin integrity, edema, etc.): VSS on RA      Pertinent Vitals/Pain Pain Assessment Pain Assessment: Faces Faces Pain Scale: Hurts whole lot Pain Location: R arm Pain Descriptors / Indicators: Grimacing, Guarding, Sore Pain Intervention(s): Limited activity within patient's tolerance, Monitored during session, Repositioned    Home Living                          Prior Function            PT Goals (current goals can now be found in the care plan section) Progress towards PT goals: Progressing toward goals    Frequency    Min 2X/week      PT Plan      Co-evaluation              AM-PAC PT 6 Clicks Mobility   Outcome Measure  Help needed turning from your back to your side while in a flat bed without using bedrails?: A Little Help needed moving from lying on your back to sitting on the side of a flat bed without using bedrails?: A Little Help needed moving to and from a bed to a chair (including a wheelchair)?: A Little Help needed standing up from a chair using your arms (e.g., wheelchair or bedside chair)?: A Little Help needed to walk in hospital room?: A Lot Help needed climbing 3-5 steps with a railing? : Total 6 Click Score: 15    End of Session Equipment Utilized During Treatment: Gait belt Activity Tolerance: Patient tolerated treatment well Patient left: Other (comment);with call bell/phone within reach;with family/visitor present (handoff to OT. Pt seated EOB) Nurse Communication: Mobility status PT Visit Diagnosis: Unsteadiness on feet (R26.81);Other abnormalities of gait and mobility (R26.89);Muscle weakness (generalized) (M62.81)     Time: 8974-8948 PT Time Calculation (min) (ACUTE ONLY): 26 min  Charges:    $Gait Training: 8-22 mins $Therapeutic Activity: 8-22 mins PT General Charges $$ ACUTE PT VISIT: 1 Visit                     Blake Santana, PT, DPT Acute Rehab Services [phone]    Blake Santana 08/08/2024, 11:22 AM

## 2024-08-08 NOTE — Progress Notes (Signed)
 Inpatient Rehab Admissions Coordinator Note:   Per updated PT recommendations patient was screened for CIR candidacy by Reche FORBES Lowers, PT. At this time, pt appears to be a potential candidate for CIR. I will place an order for rehab consult for full assessment, per our protocol.  Please contact me any with questions.SABRA Reche Lowers, PT, DPT [phone] 08/08/2024 12:46 PM

## 2024-08-08 NOTE — Evaluation (Signed)
 Clinical/Bedside Swallow Evaluation Patient Details  Name: Blake Santana MRN: [id number] Date of Birth: February 16, 1971  Today's Date: 08/08/2024 Time: SLP Start Time (ACUTE ONLY): 1430 SLP Stop Time (ACUTE ONLY): 1455 SLP Time Calculation (min) (ACUTE ONLY): 25 min  Past Medical History:  Past Medical History:  Diagnosis Date   Acute respiratory failure with hypoxia (HCC) 09/07/2018   Anemia of chronic kidney failure 03/11/2017   Cataracts, bilateral 2019   Chest pain 09/01/2018   minimal luminal irregularities   Chronic diastolic CHF (congestive heart failure) (HCC)    Chronic kidney disease 03/11/2017   Dialysis Tu/Th/Sa   COVID 2022   hospitalized   Diabetes mellitus without complication (HCC) 1999   Edema 01/14/2017   Headache    Hypercholesterolemia    Hypertension 03/11/2017   Hypertensive retinopathy of both eyes 2019   Iron  deficiency anemia 06/18/2016   Pneumonia 09/07/2018   Proliferative diabetic retinopathy with macular edema (HCC) 2019   Retinal hemorrhage, bilateral and left vitreous hemorrhage.  Being treated with Lucentis.  Dr. Raj   RSV (respiratory syncytial virus infection) 08/2017   Past Surgical History:  Past Surgical History:  Procedure Laterality Date   A/V FISTULAGRAM Left 01/19/2018   Procedure: A/V FISTULAGRAM - Left AV;  Surgeon: Sheree Penne Bruckner, MD;  Location: [facility] INVASIVE CV LAB;  Service: Cardiovascular;  Laterality: Left;   AV FISTULA PLACEMENT Left 08/09/2017   Procedure: LEFT RADIOCEPHALIC ARTERIOVENOUS (AV) FISTULA CREATION;  Surgeon: Oris Krystal FALCON, MD;  Location: [facility] OR;  Service: Vascular;  Laterality: Left;   AV FISTULA PLACEMENT Left 02/02/2018   Procedure: ARTERIOVENOUS (AV) FISTULA CREATION LEFT UPPER EXTREMITY;  Surgeon: Oris Krystal FALCON, MD;  Location: [facility] OR;  Service: Vascular;  Laterality: Left;   INSERTION OF DIALYSIS CATHETER  08/09/2017   INSERTION OF DIALYSIS CATHETER Right 08/09/2017   Procedure: INSERTION OF DIALYSIS  CATHETER RIGHT INTERNAL JUGULAR;  Surgeon: Oris Krystal FALCON, MD;  Location: [facility] OR;  Service: Vascular;  Laterality: Right;   IR DIALY SHUNT INTRO NEEDLE/INTRACATH INITIAL W/IMG LEFT Left 07/10/2020   IR DIALY SHUNT INTRO NEEDLE/INTRACATH INITIAL W/IMG LEFT Left 04/14/2021   IR REMOVAL TUN CV CATH W/O FL  07/01/2018   LEFT HEART CATH AND CORONARY ANGIOGRAPHY N/A 09/05/2018   Procedure: LEFT HEART CATH AND CORONARY ANGIOGRAPHY;  Surgeon: Dann Candyce RAMAN, MD;  Location: [facility] INVASIVE CV LAB;  Service: Cardiovascular;  Laterality: N/A;   OPEN REDUCTION INTERNAL FIXATION (ORIF) DISTAL RADIAL FRACTURE Left 11/20/2021   Procedure: POSSIBLE OPEN REDUCTION INTERNAL FIXATION (ORIF) DISTAL RADIAL FRACTURE;  Surgeon: Kendal Franky SQUIBB, MD;  Location: [facility] OR;  Service: Orthopedics;  Laterality: Left;   ORIF ANKLE FRACTURE Left 11/20/2021   Procedure: OPEN REDUCTION INTERNAL FIXATION (ORIF) ANKLE PILON FRACTURE;  Surgeon: Kendal Franky SQUIBB, MD;  Location: [facility] OR;  Service: Orthopedics;  Laterality: Left;   ORIF TIBIA PLATEAU Right 11/20/2021   Procedure: OPEN REDUCTION INTERNAL FIXATION (ORIF) TIBIAL PLATEAU;  Surgeon: Kendal Franky SQUIBB, MD;  Location: [facility] OR;  Service: Orthopedics;  Laterality: Right;   PARATHYROIDECTOMY N/A 10/26/2022   Procedure: total PARATHYROIDECTOMY;  Surgeon: Eletha Krystal, MD;  Location: [facility] OR;  Service: General;  Laterality: N/A;   PARATHYROIDECTOMY Right 10/26/2022   Procedure: AUTOTRANSPLANT TO RIGHT FOREARM;  Surgeon: Eletha Krystal, MD;  Location: [facility] OR;  Service: General;  Laterality: Right;   HPI:  Pt is 54 y.o. male presented to [facility] on 08/07/23 for aphasia. MRI showed acute lt posterior MCA infarct. PMH - ESRD on HD,  DM, Diabetic retinopahty, HTN.    Assessment / Plan / Recommendation  Clinical Impression  Pt has had intermittent coughing throught he day and could not take pills with water with RN. Pt was unable to follow all oral motor commands due to aphasia and potentially apraxia, but he was  noticed to have mild right  facial droop. Pt took 3 oz of water without coughing and consumed bites of applesauce. Pt grimaced with all swallows. When given a graham cracker mastication appeared effortful and pt had some coughing prior to swallowing. Pt is unable to communicate with Spanish speaking SLP due to aphasia (see next note) so pt unable to explain what he is experiencing, but seems to be in pain. Will downgrade diet to puree and thins, pills to be given in puree. SLP will f/u. SLP Visit Diagnosis: Dysphagia, oropharyngeal phase (R13.12)    Aspiration Risk  Mild aspiration risk    Diet Recommendation           Other Recommendations Oral Care Recommendations: Oral care BID     Swallow Evaluation Recommendations Recommendations: PO diet PO Diet Recommendation: Dysphagia 1 (Pureed);Thin liquids (Level 0) Liquid Administration via: Cup;Straw Medication Administration: Whole meds with puree Supervision: Patient able to self-feed Postural changes: Position pt fully upright for meals Oral care recommendations: Oral care BID (2x/day)   Assistance Recommended at Discharge    Functional Status Assessment Patient has had a recent decline in their functional status and demonstrates the ability to make significant improvements in function in a reasonable and predictable amount of time.  Frequency and Duration            Prognosis Prognosis for improved oropharyngeal function: Good      Swallow Study   General HPI: Pt is 54 y.o. male presented to [facility] on 08/07/23 for aphasia. MRI showed acute lt posterior MCA infarct. PMH - ESRD on HD, DM, Diabetic retinopahty, HTN. Type of Study: Bedside Swallow Evaluation Diet Prior to this Study: Regular;Thin liquids (Level 0) Temperature Spikes Noted: No Respiratory Status: Room air History of Recent Intubation: No Behavior/Cognition: Alert;Cooperative;Doesn't follow directions Oral Care Completed by SLP: Yes Oral Cavity - Dentition: Adequate  natural dentition Vision: Impaired for self-feeding Self-Feeding Abilities: Able to feed self Baseline Vocal Quality: Normal Volitional Cough: Strong    Oral/Motor/Sensory Function Overall Oral Motor/Sensory Function: Mild impairment Facial ROM: Reduced right;Suspected CN VII (facial) dysfunction   Ice Chips Ice chips: Not tested   Thin Liquid Thin Liquid: Within functional limits Presentation: Straw    Nectar Thick Nectar Thick Liquid: Not tested   Honey Thick Honey Thick Liquid: Not tested   Puree Puree: Within functional limits   Solid     Solid: Impaired Presentation: Self Fed Oral Phase Impairments: Impaired mastication;Reduced lingual movement/coordination Oral Phase Functional Implications: Prolonged oral transit;Oral residue Pharyngeal Phase Impairments: Cough - Immediate      Malayah Demuro, Consuelo Fitch 08/08/2024,3:08 PM

## 2024-08-08 NOTE — Plan of Care (Signed)
 2300 Pt stated that he is more weak on the right upper arm, assessment were done, NIH remained the same at 7, still informed MD Sundil and updated pt BP.  Problem: Health Behavior/Discharge Planning: Goal: Ability to manage health-related needs will improve Outcome: Progressing   Problem: Nutritional: Goal: Maintenance of adequate nutrition will improve Outcome: Progressing   Problem: Ischemic Stroke/TIA Tissue Perfusion: Goal: Complications of ischemic stroke/TIA will be minimized Outcome: Progressing   Problem: Coping: Goal: Will verbalize positive feelings about self Outcome: Progressing   Problem: Self-Care: Goal: Ability to communicate needs accurately will improve Outcome: Progressing   Problem: Self-Care: Goal: Verbalization of feelings and concerns over difficulty with self-care will improve Outcome: Progressing   Problem: Clinical Measurements: Goal: Will remain free from infection Outcome: Progressing   Problem: Activity: Goal: Risk for activity intolerance will decrease Outcome: Progressing   Problem: Pain Managment: Goal: General experience of comfort will improve and/or be controlled Outcome: Progressing   Problem: Safety: Goal: Ability to remain free from injury will improve Outcome: Progressing   Problem: Skin Integrity: Goal: Risk for impaired skin integrity will decrease Outcome: Progressing

## 2024-08-08 NOTE — Progress Notes (Signed)
 " PROGRESS NOTE    Blake Santana  FMW:[id number] DOB: [birth year] DOA: 08/06/2024 PCP: Leavy Lucas Sula, PA-C   Brief Narrative:    Blake Santana is a 54 y.o. male with medical history significant of ESRD on HD TTS, HFpEF, type 2 diabetes with diabetic retinopathy, anemia of chronic disease, hypertension, hyperlipidemia, PAD presented to the ED with complaints of aphasia, facial/hand weakness/numbness, and dysphagia.  Out of window for TNK at the time of arrival.  Blood pressure 231/94 on arrival but remainder of vital signs stable.  Labs notable for hemoglobin 12.1 (at baseline), sodium 134, potassium 6.4, chloride 94, bicarb 24, ethanol level <15, troponin 45.  EKG without acute ischemic changes.  Brain MRI showing acute left posterior MCA territory infarct.  Patient was given IV hydralazine , IV labetalol , and IV Ativan  in the ED.  Also given insulin /dextrose , calcium  gluconate, albuterol , sodium bicarb, and Lokelma  for hyperkalemia.     Admitted by TR[facility].   08/07/24: Received hemodialysis today. Evaluated by neurology, stroke workup ongoing.   Subjective: Interviewed the patient this morning using video interpreter in the presence of his wife.  Patient c/o HA 3/10 after tylenol . BP 150/80.  Patient denies being a heavy caffeine consumer.  Complains of blurry vision and impaired balance.  Physical therapy arrived during my visit.  He had clear word finding difficulties consistent with his stroke.  He has an expressive aphasia.  Discussed with his wife that he will probably require rehab services.     Assessment & Plan:   Principal Problem:   Acute CVA (cerebrovascular accident) [facility]) Active Problems:   Type 2 diabetes mellitus with other diabetic kidney complication (HCC)   Elevated troponin   Hyperkalemia   Hypertensive emergency     Assessment and Plan   Acute CVA L MCA territory stroke per MRI.  CT angiogram shows left M2 MCA occlusion, severe PCA P2  stenosis. - Evaluated by neurology.  They have recommended aspirin  and Plavix  for 3 weeks followed by aspirin  alone. - , hemoglobin A1c, echo, PT OT, SLP - LDL 122, prescribed statin     Hyperkalemia ESRD on HD TTS Potassium 6.4 on presentation.  Medically treated in the ER, hemodialysis.  MWF schedule outpatient   Hypertensive emergency In the setting of medication nonadherence.  SBP initially in the 230s, improved to 180s after IV hydralazine  and IV labetalol  in the ED. Given acute CVA, allow permissive hypertension for 24 hours.  On Coreg  and amlodipine  at home, will resume   Elevated troponin, questionable chest pain: Troponin elevated 45 and 49 in the setting of ESRD  Will continue to monitor in telemetry. Dual antiplatelet therapy for acute stroke.   chronic HFpEF Last echo done in January 2020 showing EF 50 to 55%, mild mitral regurgitation, mild tricuspid regurgitation.  Repeat echo ordered in the setting of acute CVA as above.  Patient mildly hypoxic in the ER, currently off supplemental oxygen   Type 2 diabetes Poorly controlled -hemoglobin A1c 10.2 on 02/29/2024.  Repeat A1c pending.  Continue sliding scale insulin  ACHS.   Anemia of chronic disease Hemoglobin stable.   Hyperlipidemia PAD Resume home medications as appropriate once reconciled    DVT prophylaxis: heparin  injection 5,000 Units Start: 08/07/24 0600     Code Status: Full Code Family Communication: Discussed with his wife at the bedside today in the presence of the video interpreter Disposition Plan: Home with home health versus SNF for rehab/acute rehab Reason for continuing need for hospitalization: Ongoing rehab services  Objective: Vitals:   08/08/24 0027 08/08/24 0150 08/08/24 0441 08/08/24 0723  BP: (!) 151/73 108/60 (!) 160/74 (!) 150/80  Pulse: 69 63 66 68  Resp: 18 18 18 15   Temp: 98.1 F (36.7 C) 98.1 F (36.7 C) 98 F (36.7 C) 98.1 F (36.7 C)  TempSrc: Oral Oral Oral   SpO2: 95%   95% 94%  Weight:      Height:        Intake/Output Summary (Last 24 hours) at 08/08/2024 0900 Last data filed at 08/07/2024 1208 Gross per 24 hour  Intake --  Output 2000 ml  Net -2000 ml   Filed Weights   08/06/24 2328  Weight: 66.2 kg    Examination:  Physical Exam HENT:     Head: Normocephalic and atraumatic.     Mouth/Throat:     Mouth: Mucous membranes are moist.  Eyes:     Extraocular Movements: Extraocular movements intact.  Pulmonary:     Effort: Pulmonary effort is normal.  Abdominal:     Palpations: Abdomen is soft.  Skin:    General: Skin is warm and dry.  Neurological:     Mental Status: He is alert and oriented to person, place, and time.     Motor: Weakness present.     Coordination: Coordination abnormal.  Psychiatric:        Mood and Affect: Mood normal.        Thought Content: Thought content normal.     Data Reviewed: I have personally reviewed following labs and imaging studies  CBC: Recent Labs  Lab 08/06/24 1727 08/06/24 1746 08/08/24 0156  WBC 8.8  --  8.9  NEUTROABS 5.9  --   --   HGB 12.1* 12.9* 11.5*  HCT 37.3* 38.0* 34.8*  MCV 97.9  --  94.6  PLT 162  --  152   Basic Metabolic Panel: Recent Labs  Lab 08/06/24 1727 08/06/24 1746 08/07/24 0227 08/08/24 0156  NA 134* 135 136 133*  K 6.4* 6.2* 5.7* 5.7*  CL 94* 98 95* 91*  CO2 24  --  22 28  GLUCOSE 171* 165* 134* 147*  BUN 50* 47* 58* 35*  CREATININE 10.60* 11.70* 11.50* 7.95*  CALCIUM  8.2*  --  7.9* 7.8*  PHOS  --   --   --  5.9*   GFR: Estimated Creatinine Clearance: 8.8 mL/min (A) (by C-G formula based on SCr of 7.95 mg/dL (H)). Liver Function Tests: Recent Labs  Lab 08/06/24 1727 08/08/24 0156  AST 13*  --   ALT 7  --   ALKPHOS 80  --   BILITOT 0.3  --   PROT 7.6  --   ALBUMIN 4.1 3.8   No results for input(s): LIPASE, AMYLASE in the last 168 hours. No results for input(s): AMMONIA in the last 168 hours. Coagulation Profile: Recent Labs  Lab  08/06/24 1727  INR 1.0   Cardiac Enzymes: No results for input(s): CKTOTAL, CKMB, CKMBINDEX, TROPONINI in the last 168 hours. ProBNP, BNP (last 5 results) No results for input(s): PROBNP, BNP in the last 8760 hours. HbA1C: Recent Labs    08/07/24 0227  HGBA1C 8.5*   CBG: Recent Labs  Lab 08/07/24 1308 08/07/24 1711 08/07/24 1828 08/07/24 2139 08/08/24 0626  GLUCAP 116* 165* 150* 184* 131*   Lipid Profile: Recent Labs    08/07/24 0227  CHOL 189  HDL 33*  LDLCALC 122*  TRIG 169*  CHOLHDL 5.7   Thyroid Function Tests: No results for  input(s): TSH, T4TOTAL, FREET4, T3FREE, THYROIDAB in the last 72 hours. Anemia Panel: No results for input(s): VITAMINB12, FOLATE, FERRITIN, TIBC, IRON , RETICCTPCT in the last 72 hours. Sepsis Labs: No results for input(s): PROCALCITON, LATICACIDVEN in the last 168 hours.  No results found for this or any previous visit (from the past 240 hours).   Radiology Studies: ECHOCARDIOGRAM COMPLETE Result Date: 08/07/2024    ECHOCARDIOGRAM REPORT   Patient Name:   Lewie Deman Santana Date of Exam: 08/07/2024 Medical Rec #:  [id number]                Height:       62.0 in Accession #:    [id number]               Weight:       146.0 lb Date of Birth:  September 16, 1970                BSA:          1.672 m Patient Age:    53 years                 BP:           205/100 mmHg Patient Gender: M                        HR:           93 bpm. Exam Location:  Inpatient Procedure: 2D Echo, Cardiac Doppler and Color Doppler (Both Spectral and Color            Flow Doppler were utilized during procedure). Indications:    Stroke I63.9  History:        Patient has prior history of Echocardiogram examinations, most                 recent 04/03/2017. Risk Factors:Hypertension.  Sonographer:    Nathanel Devonshire Referring Phys: EDITHA RAM IMPRESSIONS  1. Left ventricular ejection fraction, by estimation, is 60 to 65%. The left ventricle has  normal function. The left ventricle has no regional wall motion abnormalities. There is mild left ventricular hypertrophy. Left ventricular diastolic parameters are indeterminate.  2. Right ventricular systolic function is normal. The right ventricular size is normal.  3. The mitral valve is normal in structure. Trivial mitral valve regurgitation. No evidence of mitral stenosis.  4. The aortic valve is normal in structure. Aortic valve regurgitation is not visualized. No aortic stenosis is present.  5. The inferior vena cava is normal in size with greater than 50% respiratory variability, suggesting right atrial pressure of 3 mmHg. Conclusion(s)/Recommendation(s): No intracardiac source of embolism detected on this transthoracic study. Consider a transesophageal echocardiogram to exclude cardiac source of embolism if clinically indicated. FINDINGS  Left Ventricle: Left ventricular ejection fraction, by estimation, is 60 to 65%. The left ventricle has normal function. The left ventricle has no regional wall motion abnormalities. The left ventricular internal cavity size was normal in size. There is  mild left ventricular hypertrophy. Abnormal (paradoxical) septal motion, consistent with left bundle branch block. Left ventricular diastolic parameters are indeterminate. Right Ventricle: The right ventricular size is normal. No increase in right ventricular wall thickness. Right ventricular systolic function is normal. Left Atrium: Left atrial size was normal in size. Right Atrium: Right atrial size was normal in size. Pericardium: There is no evidence of pericardial effusion. Mitral Valve: The mitral valve is normal in structure. Trivial mitral valve regurgitation. No evidence of  mitral valve stenosis. Tricuspid Valve: The tricuspid valve is normal in structure. Tricuspid valve regurgitation is not demonstrated. No evidence of tricuspid stenosis. Aortic Valve: The aortic valve is normal in structure. Aortic valve  regurgitation is not visualized. No aortic stenosis is present. Aortic valve mean gradient measures 5.0 mmHg. Aortic valve peak gradient measures 10.0 mmHg. Aortic valve area, by VTI measures 1.60 cm. Pulmonic Valve: The pulmonic valve was normal in structure. Pulmonic valve regurgitation is trivial. No evidence of pulmonic stenosis. Aorta: The aortic root is normal in size and structure. Venous: The inferior vena cava is normal in size with greater than 50% respiratory variability, suggesting right atrial pressure of 3 mmHg. IAS/Shunts: No atrial level shunt detected by color flow Doppler.  LEFT VENTRICLE PLAX 2D LVIDd:         4.50 cm LVIDs:         3.00 cm LV PW:         1.30 cm LV IVS:        1.30 cm LVOT diam:     1.80 cm LV SV:         47 LV SV Index:   28 LVOT Area:     2.54 cm  LV Volumes (MOD) LV vol d, MOD A2C: 71.3 ml LV vol d, MOD A4C: 83.2 ml LV vol s, MOD A2C: 27.2 ml LV vol s, MOD A4C: 32.5 ml LV SV MOD A2C:     44.1 ml LV SV MOD A4C:     83.2 ml LV SV MOD BP:      49.1 ml RIGHT VENTRICLE RV Basal diam:  2.80 cm RV S prime:     15.00 cm/s TAPSE (M-mode): 1.9 cm LEFT ATRIUM             Index        RIGHT ATRIUM           Index LA diam:        3.10 cm 1.85 cm/m   RA Area:     12.20 cm LA Vol (A2C):   40.9 ml 24.46 ml/m  RA Volume:   25.70 ml  15.37 ml/m LA Vol (A4C):   52.9 ml 31.63 ml/m LA Biplane Vol: 47.5 ml 28.40 ml/m  AORTIC VALVE                    PULMONIC VALVE AV Area (Vmax):    1.69 cm     PV Vmax:       1.17 m/s AV Area (Vmean):   1.47 cm     PV Peak grad:  5.5 mmHg AV Area (VTI):     1.60 cm AV Vmax:           158.00 cm/s AV Vmean:          99.300 cm/s AV VTI:            0.293 m AV Peak Grad:      10.0 mmHg AV Mean Grad:      5.0 mmHg LVOT Vmax:         105.00 cm/s LVOT Vmean:        57.200 cm/s LVOT VTI:          0.184 m LVOT/AV VTI ratio: 0.63  AORTA Ao Root diam: 2.70 cm Ao Asc diam:  2.90 cm  SHUNTS Systemic VTI:  0.18 m Systemic Diam: 1.80 cm Oneil Parchment MD Electronically  signed by Oneil Parchment MD Signature Date/Time: 08/07/2024/4:34:40 PM  Final    CT VENOGRAM HEAD Result Date: 08/07/2024 EXAM: CT VENOGRAM WITH CONTRAST 08/07/2024 03:42:47 AM TECHNIQUE: CT venogram of the head/brain was performed with the administration of intravenous contrast. Multiplanar reformatted images are provided for review. MIP images are provided for review. Automated exposure control, iterative reconstruction, and/or weight based adjustment of the mA/kV was utilized to reduce the radiation dose to as low as reasonably achievable. COMPARISON: None available. CLINICAL HISTORY: Dural venous sinus thrombosis suspected FINDINGS: No dural venous sinus thrombosis. No significant stenosis. IMPRESSION: 1. No dural venous sinus thrombosis. Electronically signed by: Gilmore Molt 08/07/2024 04:03 AM EST RP Workstation: [id number]   CT ANGIO HEAD NECK W WO CM Result Date: 08/07/2024 EXAM: CTA HEAD AND NECK WITH AND WITHOUT 08/07/2024 03:42:47 AM TECHNIQUE: CTA of the head and neck was performed with and without the administration of intravenous contrast. Multiplanar 2D and/or 3D reformatted images are provided for review. Automated exposure control, iterative reconstruction, and/or weight based adjustment of the mA/kV was utilized to reduce the radiation dose to as low as reasonably achievable. Stenosis of the internal carotid arteries measured using NASCET criteria. COMPARISON: None available CLINICAL HISTORY: Neuro deficit, acute, stroke suspected Neuro deficit, acute, stroke suspected FINDINGS: AORTIC ARCH AND ARCH VESSELS: No dissection or arterial injury. No significant stenosis of the brachiocephalic or subclavian arteries. CERVICAL CAROTID ARTERIES: No dissection, arterial injury, or hemodynamically significant stenosis by NASCET criteria. CERVICAL VERTEBRAL ARTERIES: No dissection, arterial injury, or significant stenosis. LUNGS AND MEDIASTINUM: Unremarkable. SOFT TISSUES: No acute abnormality. BONES: No  acute abnormality. ANTERIOR CIRCULATION: No significant stenosis of the internal carotid arteries. No significant stenosis of the anterior cerebral arteries. Occluded proximal left M2 MCA branch. No aneurysm. POSTERIOR CIRCULATION: PCAs are patent. Severe bilateral P2 PCA stenosis. No significant stenosis of the basilar artery. Vertebral arteries are patent. Moderate right intradural vertebral artery stenosis. No aneurysm. OTHER: No dural venous sinus thrombosis on this non-dedicated study. Findings discussed with Dr. Khaliqdina via telephone at 3:55 AM. IMPRESSION: 1. Occluded proximal left M2 MCA branch. 2. Severe bilateral P2 PCA stenosis. 3. Moderate right intradural vertebral artery stenosis. Electronically signed by: Gilmore Molt 08/07/2024 04:00 AM EST RP Workstation: [id number]   DG CHEST PORT 1 VIEW Result Date: 08/07/2024 EXAM: 1 VIEW(S) XRAY OF THE CHEST 08/07/2024 02:24:13 AM COMPARISON: 11/17/2021 CLINICAL HISTORY: Hypoxia 200808 Hypoxia FINDINGS: LUNGS AND PLEURA: Low lung volumes. No focal pulmonary opacity. No pleural effusion. No pneumothorax. HEART AND MEDIASTINUM: Cardiomegaly with vascular congestion. BONES AND SOFT TISSUES: No acute osseous abnormality. IMPRESSION: 1. Cardiomegaly with vascular congestion. 2. Low lung volumes. Electronically signed by: Franky Crease MD 08/07/2024 02:26 AM EST RP Workstation: [id number]   MR BRAIN WO CONTRAST Result Date: 08/06/2024 EXAM: MRI BRAIN WITHOUT CONTRAST 08/06/2024 09:36:57 PM TECHNIQUE: Multiplanar multisequence MRI of the head/brain was performed without the administration of intravenous contrast. COMPARISON: CT head earlier today. CLINICAL HISTORY: Neuro deficit, acute, stroke suspected FINDINGS: BRAIN AND VENTRICLES: Acute left posterior MCA territory infarct involving the left anterior temporal lobe, overlying parietal lobe and posterior left insula. Edema without mass effect. Numerous punctate foci of susceptibility artifact throughout the  brain. No intracranial hemorrhage. No mass. No midline shift. No hydrocephalus. The sella is unremarkable. Normal flow voids. ORBITS: No acute abnormality. SINUSES AND MASTOIDS: No acute abnormality. BONES AND SOFT TISSUES: Normal marrow signal. No acute soft tissue abnormality. IMPRESSION: 1. Acute left posterior MCA territory infarct. 2. Numerous punctate foci of susceptibility artifact throughout the brain, possibly prior micro emboli. Electronically signed by:  Gilmore Molt 08/06/2024 09:54 PM EST RP Workstation: [id number]   CT HEAD WO CONTRAST Result Date: 08/06/2024 CLINICAL DATA:  Altered aphasia EXAM: CT HEAD WITHOUT CONTRAST TECHNIQUE: Contiguous axial images were obtained from the base of the skull through the vertex without intravenous contrast. RADIATION DOSE REDUCTION: This exam was performed according to the departmental dose-optimization program which includes automated exposure control, adjustment of the mA and/or kV according to patient size and/or use of iterative reconstruction technique. COMPARISON:  CT brain 11/17/2021 FINDINGS: Brain: Focal cortical and subcortical hypodensity at the left temporoparietal lobe. No hemorrhage. No mass. No significant mass effect or midline shift. The ventricles are nonenlarged Vascular: No hyperdense vessels. Carotid and vertebral calcification. Extensive small vessel calcification. Skull: No fracture Sinuses/Orbits: No acute finding. Other: None IMPRESSION: Focal cortical and subcortical hypodensity at the left temporoparietal lobe, suspicious for acute to subacute infarct. No hemorrhage. No significant mass effect or midline shift. Electronically Signed   By: Luke Bun M.D.   On: 08/06/2024 18:09    Scheduled Meds:   stroke: early stages of recovery book   Does not apply Once   amLODipine   10 mg Oral Daily   aspirin  EC  81 mg Oral Daily   carvedilol   12.5 mg Oral BID   Chlorhexidine  Gluconate Cloth  6 each Topical Q0600   clopidogrel   75 mg  Oral Daily   heparin   5,000 Units Subcutaneous Q8H   insulin  aspart  0-5 Units Subcutaneous QHS   insulin  aspart  0-6 Units Subcutaneous TID WC   rosuvastatin   10 mg Oral Daily   sodium zirconium cyclosilicate   10 g Oral Q8H   Continuous Infusions:   LOS: 2 days   Time spent: 32 minutes  Lonni KANDICE Moose, MD  [facility]  08/08/2024, 9:00 AM   "

## 2024-08-09 ENCOUNTER — Inpatient Hospital Stay (HOSPITAL_COMMUNITY): Payer: MEDICAID

## 2024-08-09 DIAGNOSIS — I639 Cerebral infarction, unspecified: Secondary | ICD-10-CM

## 2024-08-09 LAB — GLUCOSE, CAPILLARY
Glucose-Capillary: 117 mg/dL — ABNORMAL HIGH (ref 70–99)
Glucose-Capillary: 185 mg/dL — ABNORMAL HIGH (ref 70–99)
Glucose-Capillary: 110 mg/dL — ABNORMAL HIGH (ref 70–99)

## 2024-08-09 LAB — BASIC METABOLIC PANEL WITH GFR
Anion gap: 16 — ABNORMAL HIGH (ref 5–15)
BUN: 47 mg/dL — ABNORMAL HIGH (ref 6–20)
CO2: 26 mmol/L (ref 22–32)
Calcium: 7.4 mg/dL — ABNORMAL LOW (ref 8.9–10.3)
Chloride: 89 mmol/L — ABNORMAL LOW (ref 98–111)
Creatinine, Ser: 10.2 mg/dL — ABNORMAL HIGH (ref 0.61–1.24)
GFR, Estimated: 5 mL/min — ABNORMAL LOW
Glucose, Bld: 153 mg/dL — ABNORMAL HIGH (ref 70–99)
Potassium: 4.9 mmol/L (ref 3.5–5.1)
Sodium: 132 mmol/L — ABNORMAL LOW (ref 135–145)

## 2024-08-09 LAB — HIV-1 RNA QUANT-NO REFLEX-BLD
HIV 1 RNA Quant: <20 {copies}/mL
LOG10 HIV-1 RNA: UNDETERMINED {Log_copies}/mL

## 2024-08-09 LAB — HIV-1/HIV-2 QUALITATIVE RNA
Final Interpretation: NEGATIVE
HIV-1 RNA, Qualitative: NONREACTIVE
HIV-2 RNA, Qualitative: NONREACTIVE

## 2024-08-09 LAB — HEPATITIS B SURFACE ANTIGEN: Hepatitis B Surface Ag: NONREACTIVE

## 2024-08-09 LAB — HIV-1/2 AB - DIFFERENTIATION
HIV 1 Ab: NONREACTIVE
HIV 2 Ab: NONREACTIVE
Note: NEGATIVE

## 2024-08-09 LAB — MRSA NEXT GEN BY PCR, NASAL: MRSA by PCR Next Gen: NOT DETECTED

## 2024-08-09 MED ORDER — HEPARIN SODIUM (PORCINE) 1000 UNIT/ML IJ SOLN
INTRAMUSCULAR | Status: AC
  Filled 2024-08-09: qty 2

## 2024-08-09 NOTE — Progress Notes (Signed)
 [facility] Progress Note  Subjective:  Seen in room Spoke w/ interpreter, no c/o's about HD  Presentation summary: 54 y.o. year-old w/ PMH as below who presented to ED today complaining of trouble speaking and choking when he was eating dinner last night.  Then around noon he began having slurred speech and numbness on the left side of his face.  No extremity involvement. In ED BP 231/ 94, HR 72, RR 18, 100% on RA. Labs showed Na 134, K 6.4, bun 50, creat 10. MRI showed acute left posterior MCA territory infarct involving the left anterior temporal lobe, overlying parietal lobe and posterior left insula.  Some edema without mass effect.  We were asked to see for dialysis.   Vitals:   08/09/24 1538 08/09/24 1600 08/09/24 1618 08/09/24 1632  BP: (!) 150/70 (!) 159/69 (!) 159/71 (!) 158/73  Pulse: 73 73 71 69  Resp: 16 17 18 16   Temp: 97.8 F (36.6 C) 98 F (36.7 C)    TempSrc: Oral     SpO2: 94% 94% 96% 97%  Weight:  67.3 kg    Height:        Exam: Gen alert, no distress Sclera anicteric, throat clear  No jvd or bruits Chest clear bilat to bases RRR no MRG Abd soft ntnd no mass or ascites +bs Ext no LE or UE edema, no other edema Neuro is alert, nf    LUA AVF+bruit    Home bp meds: Norvasc  Coreg     [facility] HD: [facility] M[facility] 3h B350 65.2kg  AVF  Hep 2000  Last OP HD 1/02, post wt 67.2kg Hep B SAg neg 07/10/24 Heb B SAb +Immune 05/15/24     Assessment/ Plan:   # ESRD - on HD MWF - next HD today  # Hyperkalemia - K+ 6.4 in ED, had HD yesterday - K+ 4.9 this am   # acute CVA  - L posterior MCA territory per MRI - aphasia - PT seeing, CIR evaluating   # HTN - on norvasc  and coreg  - SBPs 150 /75 range     # Volume - no vol excess on exam, on RA - initial CXR showed vasc congestion - 2 L off w/ HD 1/05 - UF 1.5 L HD today     # Anemia of esrd - Hb 12- 13 here, follow, no esa needs    Myer Fret MD  [facility] 08/09/2024, 5:06 PM  Recent Labs   Lab 08/06/24 1727 08/06/24 1746 08/07/24 0227 08/08/24 0156 08/08/24 1234 08/09/24 0152  HGB 12.1* 12.9*  --  11.5*  --   --   ALBUMIN 4.1  --   --  3.8  --   --   CALCIUM  8.2*  --    < > 7.8* 7.7* 7.4*  PHOS  --   --   --  5.9*  --   --   CREATININE 10.60* 11.70*   < > 7.95* 9.05* 10.20*  K 6.4* 6.2*   < > 5.7* 5.4* 4.9   < > = values in this interval not displayed.   No results for input(s): IRON , TIBC, FERRITIN in the last 168 hours. Inpatient medications:  amLODipine   10 mg Oral Daily   aspirin  EC  81 mg Oral Daily   Chlorhexidine  Gluconate Cloth  6 each Topical Q0600   Chlorhexidine  Gluconate Cloth  6 each Topical Q0600   clopidogrel   75 mg Oral Daily   heparin   2,000 Units Dialysis Once in  dialysis   heparin   5,000 Units Subcutaneous Q8H   insulin  aspart  0-5 Units Subcutaneous QHS   insulin  aspart  0-6 Units Subcutaneous TID WC   rosuvastatin   10 mg Oral Daily    anticoagulant sodium citrate      acetaminophen  **OR** acetaminophen , alteplase , anticoagulant sodium citrate , feeding supplement (NEPRO CARB STEADY), heparin , hydrALAZINE , lidocaine  (PF), lidocaine -prilocaine , pentafluoroprop-tetrafluoroeth

## 2024-08-09 NOTE — Progress Notes (Signed)
 Speech Language Pathology Treatment: Dysphagia  Patient Details Name: Blake Santana MRN: [id number] DOB: 1970-12-07 Today's Date: 08/09/2024 Time: 1230-1310 SLP Time Calculation (min) (ACUTE ONLY): 40 min  Assessment / Plan / Recommendation Clinical Impression  Pt still appears to have a vague difficulty with pain when eating and drinking. SLP observed with pureed meal and thin liquids. He was able to to eat a few bites and sips but had some grimacing. Well after stopping eating pt has a gag and a cough, ?possible regurgitation. Global aphasia makes pt report very difficult. He did seem to indicate dental pain. Wife said he had a tooth he wanted to see the dentist about, but wasn't really complaining about pain prior to admit. Will f/u with a tooth brushing session to look for oral pain, but also may complete MBS to screen for esophageal dysphagia if no overt dental pain is identified.    Pt was able to match objects to pictures with 75% accuracy, visual impairment made looking at pictures difficult, he had to squint and bring to his face. This is probably related to diabetic retinopathy as well as new visual impairment from stroke. Pt was not able to repeat words today, but had severe apraxic errors when counting.       HPI HPI: Pt is 54 y.o. male presented to [facility] on 08/07/23 for aphasia. MRI showed acute lt posterior MCA infarct. PMH - ESRD on HD, DM, Diabetic retinopahty, HTN.      SLP Plan  Continue with current plan of care        Swallow Evaluation Recommendations   Recommendations: PO diet PO Diet Recommendation: Dysphagia 1 (Pureed);Thin liquids (Level 0) Liquid Administration via: Cup;Straw Medication Administration: Whole meds with puree Supervision: Set-up assistance for safety Postural changes: Position pt fully upright for meals;Stay upright 30-60 min after meals Oral care recommendations: Oral care BID (2x/day)     Recommendations                      Oral care BID   Frequent or constant Supervision/Assistance Aphasia (R47.01);Apraxia (R48.2)     Continue with current plan of care     Blake Santana, Blake Santana  08/09/2024, 1:43 PM

## 2024-08-09 NOTE — TOC Initial Note (Signed)
 Transition of Care [facility]) - Initial/Assessment Note    Patient Details  Name: Blake Santana MRN: [id number] Date of Birth: Jul 19, 1971  Transition of Care [facility]) CM/SW Contact:    Andrez JULIANNA George, RN Phone Number: 08/09/2024, 12:48 PM  Clinical Narrative:                 Blake Santana is a 54 y.o. male with medical history significant of ESRD on HD TTS, HFpEF, type 2 diabetes with diabetic retinopathy, anemia of chronic disease, hypertension, hyperlipidemia, PAD presented to the ED with complaints of aphasia, facial/hand weakness/numbness, and dysphagia.  Pt is from home with his spouse and children. Spouse and pt dont work. Children work and were paying bills but got behind on mortgage. They are at risk for foreclosure. CM inquired about other family that they can stay with and wife states their are some family issues and they dont have any family to go to. She was in agreement to having his information sent out in the [employer] system.  Spouse provides needed transportation.  Pt will need PCP appt. Prior to dc.   Current recommendations are for CIR. Awaiting eval.  IP Care management following.  SDOH Interventions Today    Flowsheet Row Most Recent Value  SDOH Interventions   Food Insecurity Interventions [place] [facility] Referral, Inpatient TOC  Housing Interventions [employer] [facility] Referral, Inpatient [facility]     Expected Discharge Plan: IP Rehab Facility Barriers to Discharge: Continued Medical Work up, Inadequate or no insurance   Patient Goals and CMS Choice     Choice offered to / list presented to : Patient, Spouse      Expected Discharge Plan and Services   Discharge Planning Services: CM Consult Post Acute Care Choice: IP Rehab Living arrangements for the past 2 months: Single Family Home                                      Prior Living Arrangements/Services Living arrangements for the past 2 months: Single  Family Home Lives with:: Adult Children, Spouse Patient language and need for interpreter reviewed:: Yes (spanish interpreter) Do you feel safe going back to the place where you live?: Yes        Care giver support system in place?: Yes (comment)   Criminal Activity/Legal Involvement Pertinent to Current Situation/Hospitalization: No - Comment as needed  Activities of Daily Living   ADL Screening (condition at time of admission) Independently performs ADLs?: No Does the patient have a NEW difficulty with bathing/dressing/toileting/self-feeding that is expected to last >3 days?: Yes (Initiates electronic notice to provider for possible OT consult) Does the patient have a NEW difficulty with getting in/out of bed, walking, or climbing stairs that is expected to last >3 days?: Yes (Initiates electronic notice to provider for possible PT consult) Does the patient have a NEW difficulty with communication that is expected to last >3 days?: Yes (Initiates electronic notice to provider for possible SLP consult) Is the patient deaf or have difficulty hearing?: Yes Does the patient have difficulty seeing, even when wearing glasses/contacts?: Yes Does the patient have difficulty concentrating, remembering, or making decisions?: No  Permission Sought/Granted                  Emotional Assessment Appearance:: Appears stated age         Psych Involvement: No (comment)  Admission diagnosis:  Hyperkalemia [  E87.5] Hypertensive emergency [I16.1] Acute CVA (cerebrovascular accident) [facility]) [I63.9] Cerebrovascular accident (CVA) due to occlusion of left middle cerebral artery (HCC) [I63.512] Patient Active Problem List   Diagnosis Date Noted   Hypertensive emergency 08/07/2024   Acute CVA (cerebrovascular accident) (HCC) 08/06/2024   Non-English speaking patient 10/31/2022   Chronic diastolic CHF (congestive heart failure) (HCC) 02/20/2022   Fall on and from ladder, initial encounter  11/27/2021   Closed fracture of inferior pubic ramus, left, initial encounter (HCC) 11/17/2021   Fracture of distal end of tibia with fibula, left, closed, initial encounter 11/17/2021   Distal radius fracture, left 11/17/2021   Posterior dislocation of left elbow 11/17/2021   Injury resulting from fall from height 11/17/2021   Closed fracture of proximal end of right tibia 11/17/2021   Disp fx of proximal phalanx of right lesser toe(s), init 11/17/2021   Pain of joint of left ankle and foot 06/10/2021   Pruritus, unspecified 04/15/2021   Other disorders of phosphorus metabolism 12/09/2020   Personal history of COVID-19 04/05/2020   Pneumonia due to COVID-19 virus 03/30/2020   COVID-19 03/30/2020   Sepsis (HCC) 03/30/2020   Cough 03/26/2020   Allergy, unspecified, initial encounter 02/29/2020   Anaphylactic shock, unspecified, initial encounter 02/29/2020   Noncompliance 02/24/2020   Diarrhea, unspecified 03/15/2019   TB (tuberculosis) contact 11/27/2018   COVID-19 virus detected    Hypoxia    Abnormal EKG    Volume overload 09/02/2018   Pleural effusion, bilateral 09/02/2018   Atypical chest pain 09/01/2018   Acute respiratory failure with hypoxia (HCC) 09/01/2018   Community acquired pneumonia    Elevated troponin    Fever, unspecified 02/02/2018   Pain, unspecified 02/02/2018   Dependence on renal dialysis 09/21/2017   Underimmunization status 09/07/2017   Hyperkalemia 08/31/2017   Complication of vascular dialysis catheter 08/18/2017   Shortness of breath 08/18/2017   Hyperlipidemia, unspecified 08/13/2017   Coagulation defect, unspecified 08/13/2017   Encounter for screening for other viral diseases 08/13/2017   Heart failure, unspecified (HCC) 08/13/2017   Secondary hyperparathyroidism of renal origin 08/13/2017   Unspecified protein-calorie malnutrition 08/11/2017   RSV (respiratory syncytial virus infection) 08/09/2017   ESRD on hemodialysis TTS (HCC) 08/08/2017    Proliferative diabetic retinopathy with macular edema (HCC) 08/03/2017   Hypertensive retinopathy of both eyes 08/03/2017   Cataracts, bilateral 08/03/2017   Acute exacerbation of CHF (congestive heart failure) (HCC) 04/03/2017   End stage renal disease (HCC) 03/11/2017   Hypertension associated with diabetes (HCC) 03/11/2017   Hypertensive chronic kidney disease with stage 1 through stage 4 chronic kidney disease, or unspecified chronic kidney disease 03/11/2017   Edema 01/14/2017   Dyslipidemia 10/15/2016   Diabetic retinopathy (HCC) 10/15/2016   Proteinuria, unspecified 06/18/2016   Anemia in chronic kidney disease 06/18/2016   Iron  deficiency anemia, unspecified 06/18/2016   Type 2 diabetes mellitus with other diabetic kidney complication (HCC) 10/20/2001   PCP:  Leavy Lucas Fox, PA-C Pharmacy:   [facility] [address], [place] - [address] [place] [address] Phone: [phone] Fax: [phone]  [facility] [address] [place] [address] Phone: [phone] Fax: [phone]  [facility] [address] [place] [address] Phone: [phone] Fax: [phone]     Social Drivers of Health (SDOH) Social History: SDOH Screenings   Food Insecurity: Food Insecurity Present (08/08/2024)  Housing: High Risk (08/08/2024)  Transportation Needs: No Transportation Needs (08/08/2024)  Utilities: Not At Risk (08/08/2024)  Alcohol Screen: Low Risk (02/29/2024)  Depression (PHQ2-9): Low Risk (02/29/2024)  Financial Resource Strain: Low Risk (02/29/2024)  Physical Activity: Inactive (02/29/2024)  Social Connections: Unknown (08/08/2024)  Stress: No Stress Concern Present (02/29/2024)  Tobacco Use: Medium Risk (08/06/2024)  Health Literacy: Adequate Health Literacy (02/29/2024)   SDOH Interventions: Food Insecurity Interventions: [employer] Referral, Inpatient TOC Housing Interventions: [employer] Referral, Inpatient TOC   Readmission Risk Interventions     No data to display

## 2024-08-09 NOTE — Progress Notes (Signed)
 Bilateral lower extremity venous duplex has been completed. Preliminary results can be found in CV Proc through chart review.   08/09/2024 11:05 AM Cathlyn Collet RVT

## 2024-08-09 NOTE — Progress Notes (Signed)
" ° °  Inpatient Rehabilitation Admissions Coordinator   I await wife's arrival to begin discussions for rehab options.  Heron Leavell, RN, MSN Rehab Admissions Coordinator [phone] 08/09/2024 2:43 PM  "

## 2024-08-09 NOTE — Progress Notes (Signed)
 Physical Therapy Treatment Patient Details Name: Blake Santana MRN: [id number] DOB: 1970-10-11 Today's Date: 08/09/2024   History of Present Illness Pt is 54 y.o. male presented to [facility] on 08/07/23 for aphasia. MRI showed acute lt posterior MCA infarct. PMH - ESRD on HD, DM, Diabetic retinopahty, HTN.    PT Comments  Pt with fair tolerance to treatment today. Pt speech appeared to be somewhat improved today compared to yesterday. Able to ambulate some in hallway. Pt with 1 major LOB today at EOB requiring Max A to correct. Mostly Mod A via HHA to ambulate in hallway however required dense cues for navigation due to visual deficits. No change in DC/DME recs at this time. PT will continue to follow.     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 Pt able to use RUE to push himself to EOB.    Transfers Overall transfer level: Needs assistance Equipment used: 1 person hand held assist Transfers: Sit to/from Stand Sit to Stand: Min assist           General transfer comment: for safety    Ambulation/Gait Ambulation/Gait assistance: Max assist, Mod assist Gait  Distance (Feet): 100 Feet Assistive device: 1 person hand held assist Gait Pattern/deviations: Step-through pattern, Decreased step length - right, Decreased step length - left, Decreased stride length, Drifts right/left Gait velocity: decr     General Gait Details: Pt with 1 major LOB today at EOB requiring Max A to correct. Mostly Mod A via HHA to ambulate in hallway however required dense cues for navigation due to visual deficits.   Stairs             Wheelchair Mobility     Tilt Bed    Modified Rankin (Stroke Patients Only)       Balance Overall balance assessment: Needs assistance Sitting-balance support: No upper extremity supported, Feet unsupported Sitting balance-Leahy Scale: Good     Standing balance support: No upper extremity supported, During functional activity Standing balance-Leahy Scale: Fair                              Hotel Manager: Impaired Factors Affecting Communication: Difficulty expressing self;Reduced clarity of speech;Non - English speaking, interpreter not available  Cognition Arousal: Alert Behavior During Therapy: WFL for tasks assessed/performed, Flat affect   PT - Cognitive impairments: Difficult to assess Difficult to assess due to: Impaired communication, Non-English speaking                     PT - Cognition Comments: Pt aphasic. Spanish speaking. Used in person interpreter Blake Santana who reports that pt appeared more disoriented today. Following commands: Impaired Following commands impaired: Follows  one step commands inconsistently, Follows one step commands with increased time    Cueing Cueing Techniques: Verbal cues, Visual cues, Gestural cues, Tactile cues  Exercises      General Comments General comments (skin integrity, edema, etc.): VSS      Pertinent Vitals/Pain Pain Assessment Pain Assessment: Faces Faces Pain Scale: Hurts a little bit Pain Location: grimacing  but unable to communicate where Pain Descriptors / Indicators: Grimacing, Guarding, Sore Pain Intervention(s): Monitored during session, Limited activity within patient's tolerance, Repositioned    Home Living                          Prior Function            PT Goals (current goals can now be found in the care plan section) Progress towards PT goals: Progressing toward goals    Frequency    Min 2X/week      PT Plan      Co-evaluation              AM-PAC PT 6 Clicks Mobility   Outcome Measure  Help needed turning from your back to your side while in a flat bed without using bedrails?: A Little Help needed moving from lying on your back to sitting on the side of a flat bed without using bedrails?: A Little Help needed moving to and from a bed to a chair (including a wheelchair)?: A Little Help needed standing up from a chair using your arms (e.g., wheelchair or bedside chair)?: A Little Help needed to walk in hospital room?: A Lot Help needed climbing 3-5 steps with a railing? : Total 6 Click Score: 15    End of Session Equipment Utilized During Treatment: Gait belt Activity Tolerance: Patient tolerated treatment well Patient left: in bed;with call bell/phone within reach;with bed alarm set Nurse Communication: Mobility status PT Visit Diagnosis: Unsteadiness on feet (R26.81);Other abnormalities of gait and mobility (R26.89);Muscle weakness (generalized) (M62.81)     Time: 8596-8583 PT Time Calculation (min) (ACUTE ONLY): 13 min  Charges:    $Gait Training: 8-22 mins PT General Charges $$ ACUTE PT VISIT: 1 Visit                     Blake Santana, PT, DPT Acute Rehab Services [phone]    Blake Santana 08/09/2024, 4:31 PM

## 2024-08-09 NOTE — Progress Notes (Signed)
" °  Progress Note   Date: 08/09/2024  Patient Name: Blake Santana        MRN#: [id number]   The diagnostic studies of the brain supports a diagnosis of: Extension of current stroke.  The MRI findings support extension of the current stroke.  This is not like cerebral edema with malignancy where you would treat it with dexamethasone  or even consider mannitol.  Main consideration is allowing the blood pressure to remain  higher than we would normally want at this stage of his stroke.  He will require higher blood pressure to perfuse the ischemic penumbra.  Other explanation of clinical findings (please specify)     "

## 2024-08-09 NOTE — Progress Notes (Signed)
 Transported to dialysis in bed and no sign of distress.

## 2024-08-09 NOTE — Progress Notes (Signed)
 STROKE TEAM PROGRESS NOTE   SUBJECTIVE (INTERVAL HISTORY) No family is at the bedside.  Pt eye open still has global aphasia. RUE continues to be weak and now more flaccid than yesterday. Continue to have R facial droop. RLE still strong. Repeat MRI showed left MCA infarct extended. Off coreg  and BP stable    OBJECTIVE Temp:  [97.6 F (36.4 C)-98.6 F (37 C)] 97.7 F (36.5 C) (01/07 1145) Pulse Rate:  [66-73] 72 (01/07 1145) Cardiac Rhythm: Normal sinus rhythm (01/07 0706) Resp:  [16-18] 18 (01/07 1145) BP: (141-171)/(65-81) 156/67 (01/07 1145) SpO2:  [91 %-96 %] 91 % (01/07 1145)  Recent Labs  Lab 08/08/24 1146 08/08/24 1705 08/08/24 2231 08/09/24 0638 08/09/24 1143  GLUCAP 139* 161* 196* 117* 185*   Recent Labs  Lab 08/06/24 1727 08/06/24 1746 08/07/24 0227 08/08/24 0156 08/08/24 1234 08/09/24 0152  NA 134* 135 136 133* 131* 132*  K 6.4* 6.2* 5.7* 5.7* 5.4* 4.9  CL 94* 98 95* 91* 90* 89*  CO2 24  --  22 28 26 26   GLUCOSE 171* 165* 134* 147* 143* 153*  BUN 50* 47* 58* 35* 39* 47*  CREATININE 10.60* 11.70* 11.50* 7.95* 9.05* 10.20*  CALCIUM  8.2*  --  7.9* 7.8* 7.7* 7.4*  PHOS  --   --   --  5.9*  --   --    Recent Labs  Lab 08/06/24 1727 08/08/24 0156  AST 13*  --   ALT 7  --   ALKPHOS 80  --   BILITOT 0.3  --   PROT 7.6  --   ALBUMIN 4.1 3.8   Recent Labs  Lab 08/06/24 1727 08/06/24 1746 08/08/24 0156  WBC 8.8  --  8.9  NEUTROABS 5.9  --   --   HGB 12.1* 12.9* 11.5*  HCT 37.3* 38.0* 34.8*  MCV 97.9  --  94.6  PLT 162  --  152   No results for input(s): CKTOTAL, CKMB, CKMBINDEX, TROPONINI in the last 168 hours. Recent Labs    08/06/24 1727  LABPROT 13.9  INR 1.0   No results for input(s): COLORURINE, LABSPEC, PHURINE, GLUCOSEU, HGBUR, BILIRUBINUR, KETONESUR, PROTEINUR, UROBILINOGEN, NITRITE, LEUKOCYTESUR in the last 72 hours.  Invalid input(s): APPERANCEUR     Component Value Date/Time   CHOL 189 08/07/2024  0227   CHOL 248 (H) 02/29/2024 1119   TRIG 169 (H) 08/07/2024 0227   HDL 33 (L) 08/07/2024 0227   HDL 30 (L) 02/29/2024 1119   CHOLHDL 5.7 08/07/2024 0227   VLDL 34 08/07/2024 0227   LDLCALC 122 (H) 08/07/2024 0227   LDLCALC 174 (H) 02/29/2024 1119   Lab Results  Component Value Date   HGBA1C 8.5 (H) 08/07/2024   No results found for: LABOPIA, COCAINSCRNUR, LABBENZ, AMPHETMU, THCU, LABBARB  Recent Labs  Lab 08/06/24 1727  ETH <15    I have personally reviewed the radiological images below and agree with the radiology interpretations.  VAS US  LOWER EXTREMITY VENOUS (DVT) Result Date: 08/09/2024  Lower Venous DVT Study Patient Name:  Blake Santana  Date of Exam:   08/09/2024 Medical Rec #: [id number]                 Accession #:    [id number] Date of Birth: 12-Mar-1971                 Patient Gender: M Patient Age:   54 years Exam Location:  [facility] Procedure:  VAS US  LOWER EXTREMITY VENOUS (DVT) Referring Phys: Blake Santana --------------------------------------------------------------------------------  Indications: Stroke.  Risk Factors: None identified. Comparison Study: No prior studies. Performing Technologist: Cordella Collet RVT  Examination Guidelines: A complete evaluation includes B-mode imaging, spectral Doppler, color Doppler, and power Doppler as needed of all accessible portions of each vessel. Bilateral testing is considered an integral part of a complete examination. Limited examinations for reoccurring indications may be performed as noted. The reflux portion of the exam is performed with the patient in reverse Trendelenburg.  +---------+---------------+---------+-----------+----------+--------------+ RIGHT    CompressibilityPhasicitySpontaneityPropertiesThrombus Aging +---------+---------------+---------+-----------+----------+--------------+ CFV      Full           Yes      Yes                                  +---------+---------------+---------+-----------+----------+--------------+ SFJ      Full                                                        +---------+---------------+---------+-----------+----------+--------------+ FV Prox  Full                                                        +---------+---------------+---------+-----------+----------+--------------+ FV Mid   Full                                                        +---------+---------------+---------+-----------+----------+--------------+ FV DistalFull                                                        +---------+---------------+---------+-----------+----------+--------------+ PFV      Full                                                        +---------+---------------+---------+-----------+----------+--------------+ POP      Full           Yes      Yes                                 +---------+---------------+---------+-----------+----------+--------------+ PTV      Full                                                        +---------+---------------+---------+-----------+----------+--------------+ PERO     Full                                                        +---------+---------------+---------+-----------+----------+--------------+   +---------+---------------+---------+-----------+----------+--------------+  LEFT     CompressibilityPhasicitySpontaneityPropertiesThrombus Aging +---------+---------------+---------+-----------+----------+--------------+ CFV      Full           Yes      Yes                                 +---------+---------------+---------+-----------+----------+--------------+ SFJ      Full                                                        +---------+---------------+---------+-----------+----------+--------------+ FV Prox  Full                                                         +---------+---------------+---------+-----------+----------+--------------+ FV Mid   Full                                                        +---------+---------------+---------+-----------+----------+--------------+ FV DistalFull           Yes      Yes                                 +---------+---------------+---------+-----------+----------+--------------+ PFV      Full                                                        +---------+---------------+---------+-----------+----------+--------------+ POP      Full           Yes      Yes                                 +---------+---------------+---------+-----------+----------+--------------+ PTV      Full                                                        +---------+---------------+---------+-----------+----------+--------------+ PERO     Full                                                        +---------+---------------+---------+-----------+----------+--------------+    Summary: RIGHT: - There is no evidence of deep vein thrombosis in the lower extremity.  - No cystic structure found in the popliteal fossa.  LEFT: - There is no evidence of deep vein thrombosis in the lower extremity.  - No cystic structure found in the popliteal fossa.  *  See table(s) above for measurements and observations.    Preliminary    MR ANGIO HEAD WO CONTRAST Result Date: 08/09/2024 EXAM: MR Angiography Head without intravenous Contrast. 08/08/2024 09:52:09 PM TECHNIQUE: Magnetic resonance angiography images of the head without intravenous contrast. Multiplanar 2D and 3D reformatted images are provided for review. COMPARISON: MR Head Without IV Contrast 08/06/2024. CLINICAL HISTORY: Stroke, follow up. FINDINGS: Mild to moderately motion limited study. ANTERIOR CIRCULATION: No significant stenosis of the internal carotid arteries. No significant stenosis of the anterior cerebral arteries. Hypoplastic right A1 ACA. Similar occluded  proximal left M2 MCA. No aneurysm. POSTERIOR CIRCULATION: Severe right and moderate left P2 PCA stenosis. No significant stenosis of the basilar artery. No significant stenosis of the vertebral arteries. No aneurysm. IMPRESSION: 1. Similar occluded proximal left M2 MCA. 2. Severe right and moderate left P2 PCA stenosis. Electronically signed by: Gilmore Molt 08/09/2024 03:35 AM EST RP Workstation: [id number]   MR BRAIN WO CONTRAST Result Date: 08/09/2024 EXAM: MRI BRAIN WITHOUT CONTRAST 08/08/2024 09:38:18 PM TECHNIQUE: Multiplanar multisequence MRI of the head/brain was performed without the administration of intravenous contrast. COMPARISON: MRI head 08/06/2024 CLINICAL HISTORY: Stroke, follow up. FINDINGS: BRAIN AND VENTRICLES: Increasingly confluent and expanded left posterior MCA territory infarct. Increased edema with slight (2 mm) rightward midline shift. Numerous punctate foci of susceptibility artifact throughout the brain. No intracranial hemorrhage. No mass. No hydrocephalus. The sella is unremarkable. ORBITS: No acute abnormality. SINUSES AND MASTOIDS: No acute abnormality. BONES AND SOFT TISSUES: Normal marrow signal. No acute soft tissue abnormality. IMPRESSION: 1. In comparison to 08/06/2024 MRI , increasingly confluent and expanded left posterior MCA territory infarct. Increased edema with slight (2 mm) rightward midline shift. 2. Numerous punctate foci of susceptibility artifact throughout the brain, possibly prior micro emboli. Electronically signed by: Gilmore Molt 08/09/2024 03:27 AM EST RP Workstation: [id number]   DG Shoulder Right Port Result Date: 08/08/2024 CLINICAL DATA:  Right shoulder pain. EXAM: RIGHT SHOULDER - 1 VIEW COMPARISON:  None Available. FINDINGS: There is no evidence of acute fracture or dislocation. A small chronic appearing deformity is seen along the dorsal aspect of the distal right clavicle. Soft tissues are unremarkable. Mild to moderate severity atelectasis  and/or infiltrate is noted within the visualized portion of the right lung base. IMPRESSION: 1. No acute osseous abnormality. Electronically Signed   By: Suzen Dials M.D.   On: 08/08/2024 20:20   ECHOCARDIOGRAM COMPLETE Result Date: 08/07/2024    ECHOCARDIOGRAM REPORT   Patient Name:   Blake Santana Date of Exam: 08/07/2024 Medical Rec #:  [id number]                Height:       62.0 in Accession #:    [id number]               Weight:       146.0 lb Date of Birth:  04-01-1971                BSA:          1.672 m Patient Age:    53 years                 BP:           205/100 mmHg Patient Gender: M                        HR:           93 bpm. Exam  Location:  Inpatient Procedure: 2D Echo, Cardiac Doppler and Color Doppler (Both Spectral and Color            Flow Doppler were utilized during procedure). Indications:    Stroke I63.9  History:        Patient has prior history of Echocardiogram examinations, most                 recent 04/03/2017. Risk Factors:Hypertension.  Sonographer:    Nathanel Devonshire Referring Phys: EDITHA RAM IMPRESSIONS  1. Left ventricular ejection fraction, by estimation, is 60 to 65%. The left ventricle has normal function. The left ventricle has no regional wall motion abnormalities. There is mild left ventricular hypertrophy. Left ventricular diastolic parameters are indeterminate.  2. Right ventricular systolic function is normal. The right ventricular size is normal.  3. The mitral valve is normal in structure. Trivial mitral valve regurgitation. No evidence of mitral stenosis.  4. The aortic valve is normal in structure. Aortic valve regurgitation is not visualized. No aortic stenosis is present.  5. The inferior vena cava is normal in size with greater than 50% respiratory variability, suggesting right atrial pressure of 3 mmHg. Conclusion(s)/Recommendation(s): No intracardiac source of embolism detected on this transthoracic study. Consider a transesophageal echocardiogram  to exclude cardiac source of embolism if clinically indicated. FINDINGS  Left Ventricle: Left ventricular ejection fraction, by estimation, is 60 to 65%. The left ventricle has normal function. The left ventricle has no regional wall motion abnormalities. The left ventricular internal cavity size was normal in size. There is  mild left ventricular hypertrophy. Abnormal (paradoxical) septal motion, consistent with left bundle branch block. Left ventricular diastolic parameters are indeterminate. Right Ventricle: The right ventricular size is normal. No increase in right ventricular wall thickness. Right ventricular systolic function is normal. Left Atrium: Left atrial size was normal in size. Right Atrium: Right atrial size was normal in size. Pericardium: There is no evidence of pericardial effusion. Mitral Valve: The mitral valve is normal in structure. Trivial mitral valve regurgitation. No evidence of mitral valve stenosis. Tricuspid Valve: The tricuspid valve is normal in structure. Tricuspid valve regurgitation is not demonstrated. No evidence of tricuspid stenosis. Aortic Valve: The aortic valve is normal in structure. Aortic valve regurgitation is not visualized. No aortic stenosis is present. Aortic valve mean gradient measures 5.0 mmHg. Aortic valve peak gradient measures 10.0 mmHg. Aortic valve area, by VTI measures 1.60 cm. Pulmonic Valve: The pulmonic valve was normal in structure. Pulmonic valve regurgitation is trivial. No evidence of pulmonic stenosis. Aorta: The aortic root is normal in size and structure. Venous: The inferior vena cava is normal in size with greater than 50% respiratory variability, suggesting right atrial pressure of 3 mmHg. IAS/Shunts: No atrial level shunt detected by color flow Doppler.  LEFT VENTRICLE PLAX 2D LVIDd:         4.50 cm LVIDs:         3.00 cm LV PW:         1.30 cm LV IVS:        1.30 cm LVOT diam:     1.80 cm LV SV:         47 LV SV Index:   28 LVOT Area:     2.54  cm  LV Volumes (MOD) LV vol d, MOD A2C: 71.3 ml LV vol d, MOD A4C: 83.2 ml LV vol s, MOD A2C: 27.2 ml LV vol s, MOD A4C: 32.5 ml LV SV MOD A2C:  44.1 ml LV SV MOD A4C:     83.2 ml LV SV MOD BP:      49.1 ml RIGHT VENTRICLE RV Basal diam:  2.80 cm RV S prime:     15.00 cm/s TAPSE (M-mode): 1.9 cm LEFT ATRIUM             Index        RIGHT ATRIUM           Index LA diam:        3.10 cm 1.85 cm/m   RA Area:     12.20 cm LA Vol (A2C):   40.9 ml 24.46 ml/m  RA Volume:   25.70 ml  15.37 ml/m LA Vol (A4C):   52.9 ml 31.63 ml/m LA Biplane Vol: 47.5 ml 28.40 ml/m  AORTIC VALVE                    PULMONIC VALVE AV Area (Vmax):    1.69 cm     PV Vmax:       1.17 m/s AV Area (Vmean):   1.47 cm     PV Peak grad:  5.5 mmHg AV Area (VTI):     1.60 cm AV Vmax:           158.00 cm/s AV Vmean:          99.300 cm/s AV VTI:            0.293 m AV Peak Grad:      10.0 mmHg AV Mean Grad:      5.0 mmHg LVOT Vmax:         105.00 cm/s LVOT Vmean:        57.200 cm/s LVOT VTI:          0.184 m LVOT/AV VTI ratio: 0.63  AORTA Ao Root diam: 2.70 cm Ao Asc diam:  2.90 cm  SHUNTS Systemic VTI:  0.18 m Systemic Diam: 1.80 cm Oneil Parchment MD Electronically signed by Oneil Parchment MD Signature Date/Time: 08/07/2024/4:34:40 PM    Final    CT VENOGRAM HEAD Result Date: 08/07/2024 EXAM: CT VENOGRAM WITH CONTRAST 08/07/2024 03:42:47 AM TECHNIQUE: CT venogram of the head/brain was performed with the administration of intravenous contrast. Multiplanar reformatted images are provided for review. MIP images are provided for review. Automated exposure control, iterative reconstruction, and/or weight based adjustment of the mA/kV was utilized to reduce the radiation dose to as low as reasonably achievable. COMPARISON: None available. CLINICAL HISTORY: Dural venous sinus thrombosis suspected FINDINGS: No dural venous sinus thrombosis. No significant stenosis. IMPRESSION: 1. No dural venous sinus thrombosis. Electronically signed by: Gilmore Molt  08/07/2024 04:03 AM EST RP Workstation: [id number]   CT ANGIO HEAD NECK W WO CM Result Date: 08/07/2024 EXAM: CTA HEAD AND NECK WITH AND WITHOUT 08/07/2024 03:42:47 AM TECHNIQUE: CTA of the head and neck was performed with and without the administration of intravenous contrast. Multiplanar 2D and/or 3D reformatted images are provided for review. Automated exposure control, iterative reconstruction, and/or weight based adjustment of the mA/kV was utilized to reduce the radiation dose to as low as reasonably achievable. Stenosis of the internal carotid arteries measured using NASCET criteria. COMPARISON: None available CLINICAL HISTORY: Neuro deficit, acute, stroke suspected Neuro deficit, acute, stroke suspected FINDINGS: AORTIC ARCH AND ARCH VESSELS: No dissection or arterial injury. No significant stenosis of the brachiocephalic or subclavian arteries. CERVICAL CAROTID ARTERIES: No dissection, arterial injury, or hemodynamically significant stenosis by NASCET criteria. CERVICAL VERTEBRAL ARTERIES: No dissection, arterial  injury, or significant stenosis. LUNGS AND MEDIASTINUM: Unremarkable. SOFT TISSUES: No acute abnormality. BONES: No acute abnormality. ANTERIOR CIRCULATION: No significant stenosis of the internal carotid arteries. No significant stenosis of the anterior cerebral arteries. Occluded proximal left M2 MCA branch. No aneurysm. POSTERIOR CIRCULATION: PCAs are patent. Severe bilateral P2 PCA stenosis. No significant stenosis of the basilar artery. Vertebral arteries are patent. Moderate right intradural vertebral artery stenosis. No aneurysm. OTHER: No dural venous sinus thrombosis on this non-dedicated study. Findings discussed with Dr. Khaliqdina via telephone at 3:55 AM. IMPRESSION: 1. Occluded proximal left M2 MCA branch. 2. Severe bilateral P2 PCA stenosis. 3. Moderate right intradural vertebral artery stenosis. Electronically signed by: Gilmore Molt 08/07/2024 04:00 AM EST RP Workstation:  [id number]   DG CHEST PORT 1 VIEW Result Date: 08/07/2024 EXAM: 1 VIEW(S) XRAY OF THE CHEST 08/07/2024 02:24:13 AM COMPARISON: 11/17/2021 CLINICAL HISTORY: Hypoxia 200808 Hypoxia FINDINGS: LUNGS AND PLEURA: Low lung volumes. No focal pulmonary opacity. No pleural effusion. No pneumothorax. HEART AND MEDIASTINUM: Cardiomegaly with vascular congestion. BONES AND SOFT TISSUES: No acute osseous abnormality. IMPRESSION: 1. Cardiomegaly with vascular congestion. 2. Low lung volumes. Electronically signed by: Franky Crease MD 08/07/2024 02:26 AM EST RP Workstation: [id number]   MR BRAIN WO CONTRAST Result Date: 08/06/2024 EXAM: MRI BRAIN WITHOUT CONTRAST 08/06/2024 09:36:57 PM TECHNIQUE: Multiplanar multisequence MRI of the head/brain was performed without the administration of intravenous contrast. COMPARISON: CT head earlier today. CLINICAL HISTORY: Neuro deficit, acute, stroke suspected FINDINGS: BRAIN AND VENTRICLES: Acute left posterior MCA territory infarct involving the left anterior temporal lobe, overlying parietal lobe and posterior left insula. Edema without mass effect. Numerous punctate foci of susceptibility artifact throughout the brain. No intracranial hemorrhage. No mass. No midline shift. No hydrocephalus. The sella is unremarkable. Normal flow voids. ORBITS: No acute abnormality. SINUSES AND MASTOIDS: No acute abnormality. BONES AND SOFT TISSUES: Normal marrow signal. No acute soft tissue abnormality. IMPRESSION: 1. Acute left posterior MCA territory infarct. 2. Numerous punctate foci of susceptibility artifact throughout the brain, possibly prior micro emboli. Electronically signed by: Gilmore Molt 08/06/2024 09:54 PM EST RP Workstation: [id number]   CT HEAD WO CONTRAST Result Date: 08/06/2024 CLINICAL DATA:  Altered aphasia EXAM: CT HEAD WITHOUT CONTRAST TECHNIQUE: Contiguous axial images were obtained from the base of the skull through the vertex without intravenous contrast. RADIATION DOSE  REDUCTION: This exam was performed according to the departmental dose-optimization program which includes automated exposure control, adjustment of the mA and/or kV according to patient size and/or use of iterative reconstruction technique. COMPARISON:  CT brain 11/17/2021 FINDINGS: Brain: Focal cortical and subcortical hypodensity at the left temporoparietal lobe. No hemorrhage. No mass. No significant mass effect or midline shift. The ventricles are nonenlarged Vascular: No hyperdense vessels. Carotid and vertebral calcification. Extensive small vessel calcification. Skull: No fracture Sinuses/Orbits: No acute finding. Other: None IMPRESSION: Focal cortical and subcortical hypodensity at the left temporoparietal lobe, suspicious for acute to subacute infarct. No hemorrhage. No significant mass effect or midline shift. Electronically Signed   By: Luke Bun M.D.   On: 08/06/2024 18:09     PHYSICAL EXAM  Temp:  [97.6 F (36.4 C)-98.6 F (37 C)] 97.7 F (36.5 C) (01/07 1145) Pulse Rate:  [66-73] 72 (01/07 1145) Resp:  [16-18] 18 (01/07 1145) BP: (141-171)/(65-81) 156/67 (01/07 1145) SpO2:  [91 %-96 %] 91 % (01/07 1145)  General - Well nourished, well developed, in mild acute distress from R shoulder pain.  Ophthalmologic - fundi not visualized due to noncooperation.  Cardiovascular -  Regular rhythm and rate.  Neuro - Pt is awake, alert, eyes open, global aphasia, only mumbles without meaningful words. Not following commands. No gaze palsy, able to track bilaterally but incomplete on both directions. Not consistently blinking to visual threat bilaterally but seems blinking more on the left and less on the right, PERRL. R facial droop. Tongue protrusion not cooperative. RUE 1/5 proximal and 0/5 distally. Bilaterally LEs and LUE 5/5, no drift. Sensation, coordination not cooperative and gait not tested.    ASSESSMENT/PLAN Mr. Blake Santana is a 54 y.o. male with history of ESRD on  HD, hypertension, hyperlipidemia, diabetes with retinopathy, PAD admitted for global aphasia. No TNK given due to outside window.    Stroke with extension:  left MCA moderately large infarct, likely secondary to large vessel disease, can not rule out cardioembolic source CT left temporoparietal acute infarct CTA head and neck left M2 occlusion, severe bilateral P2 stenosis, right V4 moderate stenosis CTV negative MRI Acute left posterior MCA territory infarct. Numerous punctate foci of susceptibility artifact throughout the brain, possibly prior micro emboli. Limited MRI repeat showed left MCA infarct extended to moderately large size of stroke MRA Similar occluded proximal left M2 MCA. Severe right and moderate left P2 PCA stenosis. 2D Echo EF 60 to 65% LE venous Doppler no DVT Will consider 30 day monitoring as outpt LDL 122 HgbA1c 8.5 Heparin  subcu for VTE prophylaxis No antithrombotic prior to admission, now on aspirin  81 mg daily and clopidogrel  75 mg daily DAPT for 3 months and then aspirin  alone given M2 occlusion Ongoing aggressive stroke risk factor management Therapy recommendations: CIR Disposition: Pending  Diabetes HgbA1c 8.5, goal < 7.0 Uncontrolled CBG monitoring SSI DM education and close PCP follow up  Hypertension Unstable on the high end Hold off BP meds for now Permissive hypertension for now given the worsening symptoms with BP goal 140-180  Hyperlipidemia Home meds: Crestor  10 LDL 122, goal < 70 Now on Crestor  10 which is the highest dose for ESRD patient Continue statin at discharge  Other Stroke Risk Factors PAD  Other Active Problems ESRD on dialysis R shoulder pain - X-ray neg  Hospital day # 3  Discussed with Dr. Maranda Blake Cummins, MD PhD Stroke Neurology 08/09/2024 12:08 PM    To contact Stroke Continuity provider, please refer to [email or web address]. After hours, contact General Neurology

## 2024-08-09 NOTE — Plan of Care (Signed)
" °  Problem: Nutritional: Goal: Maintenance of adequate nutrition will improve Outcome: Progressing   Problem: Ischemic Stroke/TIA Tissue Perfusion: Goal: Complications of ischemic stroke/TIA will be minimized Outcome: Progressing   Problem: Coping: Goal: Will verbalize positive feelings about self Outcome: Progressing   Problem: Health Behavior/Discharge Planning: Goal: Ability to manage health-related needs will improve Outcome: Progressing   Problem: Self-Care: Goal: Ability to communicate needs accurately will improve Outcome: Progressing   Problem: Nutrition: Goal: Risk of aspiration will decrease Outcome: Progressing   Problem: Safety: Goal: Ability to remain free from injury will improve Outcome: Progressing   "

## 2024-08-09 NOTE — Progress Notes (Signed)
 " PROGRESS NOTE    Blake Santana  FMW:[id number] DOB: 10-13-1970 DOA: 08/06/2024 PCP: Leavy Lucas Sula, PA-C   Brief Narrative:    Blake Santana is a 54 y.o. male with medical history significant of ESRD on HD TTS, HFpEF, type 2 diabetes with diabetic retinopathy, anemia of chronic disease, hypertension, hyperlipidemia, PAD presented to the ED with complaints of aphasia, facial/hand weakness/numbness, and dysphagia.  Out of window for TNK at the time of arrival.  Blood pressure 231/94 on arrival but remainder of vital signs stable.  Labs notable for hemoglobin 12.1 (at baseline), sodium 134, potassium 6.4, chloride 94, bicarb 24, ethanol level <15, troponin 45.  EKG without acute ischemic changes.  Brain MRI showing acute left posterior MCA territory infarct.  Patient was given IV hydralazine , IV labetalol , and IV Ativan  in the ED.  Also given insulin /dextrose , calcium  gluconate, albuterol , sodium bicarb, and Lokelma  for hyperkalemia.     Admitted by [facility].   08/07/24: Received hemodialysis today. Evaluated by neurology, stroke workup ongoing. 08/07/24: Repeat MRI increased edema and slight midline shift , MRA with occluded left M2 MCA, severe right and moderate left PCA stenosis   Subjective: Interviewed the patient this morning using live interpreter.  Patient c/o HA 3/10 after tylenol .  Patient speech remains dysarthric and even the interpreter has difficulty understanding it.  He denied any pain complaints.  Head CT shows extension of stroke.  I discussed with neurology.  Plan will be to allow for permissive hypertension once again.  Keep blood pressure elevated over the next couple days.  I advised nurse to keep blood pressure 150/80 or higher.  If blood pressure drops below that would restart fluids and hold antihypertensives.  I discussed this with nurse.     Assessment & Plan:   Principal Problem:   Acute CVA (cerebrovascular accident) [facility]) Active Problems:   Type 2  diabetes mellitus with other diabetic kidney complication (HCC)   Elevated troponin   Hyperkalemia   Hypertensive emergency     Assessment and Plan   Acute CVA L MCA territory stroke per MRI.  CT angiogram shows left M2 MCA occlusion, severe PCA P2 stenosis. - Evaluated by neurology.  They have recommended aspirin  and Plavix  for 3 weeks followed by aspirin  alone. - , hemoglobin A1c, echo, PT OT, SLP - LDL 122, prescribed statin     Hyperkalemia ESRD on HD TTS Potassium 6.4 on presentation.  Medically treated in the ER, hemodialysis.  MWF schedule outpatient   Hypertensive emergency In the setting of medication nonadherence.  SBP initially in the 230s, improved to 180s after IV hydralazine  and IV labetalol  in the ED. Given acute CVA, allow permissive hypertension for 24 hours.  On Coreg  and amlodipine  at home, will resume   Elevated troponin, questionable chest pain: Troponin elevated 45 and 49 in the setting of ESRD  Will continue to monitor in telemetry. Dual antiplatelet therapy for acute stroke.   chronic HFpEF Last echo done in January 2020 showing EF 50 to 55%, mild mitral regurgitation, mild tricuspid regurgitation.  Repeat echo ordered in the setting of acute CVA as above.  Patient mildly hypoxic in the ER, currently off supplemental oxygen   Type 2 diabetes Poorly controlled -hemoglobin A1c 10.2 on 02/29/2024.  Repeat A1c pending.  Continue sliding scale insulin  ACHS.   Anemia of chronic disease Hemoglobin stable.   Hyperlipidemia PAD Resume home medications as appropriate once reconciled    DVT prophylaxis: heparin  injection 5,000 Units Start: 08/07/24 0600  Code Status: Full Code Family Communication: Wife not at the bedside. Disposition Plan: Home with home health versus SNF for rehab/acute rehab Reason for continuing need for hospitalization: Ongoing rehab services  Objective: Vitals:   08/08/24 1928 08/08/24 2323 08/09/24 0414 08/09/24 0738  BP: (!)  151/69 (!) 161/73 (!) 157/81 (!) 171/68  Pulse: 69 68 68 70  Resp: 18 18 18 16   Temp: 97.6 F (36.4 C) 98.3 F (36.8 C) 98.1 F (36.7 C) 97.7 F (36.5 C)  TempSrc: Oral Oral Oral Oral  SpO2: 93% 93% 95% 93%  Weight:      Height:        Intake/Output Summary (Last 24 hours) at 08/09/2024 0902 Last data filed at 08/08/2024 2230 Gross per 24 hour  Intake 60 ml  Output --  Net 60 ml   Filed Weights   08/06/24 2328  Weight: 66.2 kg    Examination: Speech is dysarthric. Physical Exam HENT:     Head: Normocephalic and atraumatic.     Mouth/Throat:     Mouth: Mucous membranes are moist.  Eyes:     Extraocular Movements: Extraocular movements intact.  Pulmonary:     Effort: Pulmonary effort is normal.  Abdominal:     Palpations: Abdomen is soft.  Skin:    General: Skin is warm and dry.  Neurological:     Mental Status: He is alert and oriented to person, place, and time.     Motor: Weakness present.     Coordination: Coordination abnormal.  Psychiatric:        Mood and Affect: Mood normal.        Thought Content: Thought content normal.     Data Reviewed: I have personally reviewed following labs and imaging studies  CBC: Recent Labs  Lab 08/06/24 1727 08/06/24 1746 08/08/24 0156  WBC 8.8  --  8.9  NEUTROABS 5.9  --   --   HGB 12.1* 12.9* 11.5*  HCT 37.3* 38.0* 34.8*  MCV 97.9  --  94.6  PLT 162  --  152   Basic Metabolic Panel: Recent Labs  Lab 08/06/24 1727 08/06/24 1746 08/07/24 0227 08/08/24 0156 08/08/24 1234 08/09/24 0152  NA 134* 135 136 133* 131* 132*  K 6.4* 6.2* 5.7* 5.7* 5.4* 4.9  CL 94* 98 95* 91* 90* 89*  CO2 24  --  22 28 26 26   GLUCOSE 171* 165* 134* 147* 143* 153*  BUN 50* 47* 58* 35* 39* 47*  CREATININE 10.60* 11.70* 11.50* 7.95* 9.05* 10.20*  CALCIUM  8.2*  --  7.9* 7.8* 7.7* 7.4*  PHOS  --   --   --  5.9*  --   --    GFR: Estimated Creatinine Clearance: 6.9 mL/min (A) (by C-G formula based on SCr of 10.2 mg/dL (H)). Liver  Function Tests: Recent Labs  Lab 08/06/24 1727 08/08/24 0156  AST 13*  --   ALT 7  --   ALKPHOS 80  --   BILITOT 0.3  --   PROT 7.6  --   ALBUMIN 4.1 3.8   No results for input(s): LIPASE, AMYLASE in the last 168 hours. No results for input(s): AMMONIA in the last 168 hours. Coagulation Profile: Recent Labs  Lab 08/06/24 1727  INR 1.0   Cardiac Enzymes: No results for input(s): CKTOTAL, CKMB, CKMBINDEX, TROPONINI in the last 168 hours. ProBNP, BNP (last 5 results) No results for input(s): PROBNP, BNP in the last 8760 hours. HbA1C: Recent Labs    08/07/24 0227  HGBA1C 8.5*   CBG: Recent Labs  Lab 08/08/24 0626 08/08/24 1146 08/08/24 1705 08/08/24 2231 08/09/24 0638  GLUCAP 131* 139* 161* 196* 117*   Lipid Profile: Recent Labs    08/07/24 0227  CHOL 189  HDL 33*  LDLCALC 122*  TRIG 169*  CHOLHDL 5.7   Thyroid Function Tests: No results for input(s): TSH, T4TOTAL, FREET4, T3FREE, THYROIDAB in the last 72 hours. Anemia Panel: No results for input(s): VITAMINB12, FOLATE, FERRITIN, TIBC, IRON , RETICCTPCT in the last 72 hours. Sepsis Labs: No results for input(s): PROCALCITON, LATICACIDVEN in the last 168 hours.  No results found for this or any previous visit (from the past 240 hours).   Radiology Studies: MR ANGIO HEAD WO CONTRAST Result Date: 08/09/2024 EXAM: MR Angiography Head without intravenous Contrast. 08/08/2024 09:52:09 PM TECHNIQUE: Magnetic resonance angiography images of the head without intravenous contrast. Multiplanar 2D and 3D reformatted images are provided for review. COMPARISON: MR Head Without IV Contrast 08/06/2024. CLINICAL HISTORY: Stroke, follow up. FINDINGS: Mild to moderately motion limited study. ANTERIOR CIRCULATION: No significant stenosis of the internal carotid arteries. No significant stenosis of the anterior cerebral arteries. Hypoplastic right A1 ACA. Similar occluded proximal left M2  MCA. No aneurysm. POSTERIOR CIRCULATION: Severe right and moderate left P2 PCA stenosis. No significant stenosis of the basilar artery. No significant stenosis of the vertebral arteries. No aneurysm. IMPRESSION: 1. Similar occluded proximal left M2 MCA. 2. Severe right and moderate left P2 PCA stenosis. Electronically signed by: Gilmore Molt 08/09/2024 03:35 AM EST RP Workstation: [id number]   MR BRAIN WO CONTRAST Result Date: 08/09/2024 EXAM: MRI BRAIN WITHOUT CONTRAST 08/08/2024 09:38:18 PM TECHNIQUE: Multiplanar multisequence MRI of the head/brain was performed without the administration of intravenous contrast. COMPARISON: MRI head 08/06/2024 CLINICAL HISTORY: Stroke, follow up. FINDINGS: BRAIN AND VENTRICLES: Increasingly confluent and expanded left posterior MCA territory infarct. Increased edema with slight (2 mm) rightward midline shift. Numerous punctate foci of susceptibility artifact throughout the brain. No intracranial hemorrhage. No mass. No hydrocephalus. The sella is unremarkable. ORBITS: No acute abnormality. SINUSES AND MASTOIDS: No acute abnormality. BONES AND SOFT TISSUES: Normal marrow signal. No acute soft tissue abnormality. IMPRESSION: 1. In comparison to 08/06/2024 MRI , increasingly confluent and expanded left posterior MCA territory infarct. Increased edema with slight (2 mm) rightward midline shift. 2. Numerous punctate foci of susceptibility artifact throughout the brain, possibly prior micro emboli. Electronically signed by: Gilmore Molt 08/09/2024 03:27 AM EST RP Workstation: [id number]   DG Shoulder Right Port Result Date: 08/08/2024 CLINICAL DATA:  Right shoulder pain. EXAM: RIGHT SHOULDER - 1 VIEW COMPARISON:  None Available. FINDINGS: There is no evidence of acute fracture or dislocation. A small chronic appearing deformity is seen along the dorsal aspect of the distal right clavicle. Soft tissues are unremarkable. Mild to moderate severity atelectasis and/or infiltrate  is noted within the visualized portion of the right lung base. IMPRESSION: 1. No acute osseous abnormality. Electronically Signed   By: Suzen Dials M.D.   On: 08/08/2024 20:20   ECHOCARDIOGRAM COMPLETE Result Date: 08/07/2024    ECHOCARDIOGRAM REPORT   Patient Name:   Blake Santana Date of Exam: 08/07/2024 Medical Rec #:  [id number]                Height:       62.0 in Accession #:    [id number]               Weight:       146.0 lb Date of  Birth:  09-Dec-1970                BSA:          1.672 m Patient Age:    53 years                 BP:           205/100 mmHg Patient Gender: M                        HR:           93 bpm. Exam Location:  Inpatient Procedure: 2D Echo, Cardiac Doppler and Color Doppler (Both Spectral and Color            Flow Doppler were utilized during procedure). Indications:    Stroke I63.9  History:        Patient has prior history of Echocardiogram examinations, most                 recent 04/03/2017. Risk Factors:Hypertension.  Sonographer:    Nathanel Devonshire Referring Phys: EDITHA RAM IMPRESSIONS  1. Left ventricular ejection fraction, by estimation, is 60 to 65%. The left ventricle has normal function. The left ventricle has no regional wall motion abnormalities. There is mild left ventricular hypertrophy. Left ventricular diastolic parameters are indeterminate.  2. Right ventricular systolic function is normal. The right ventricular size is normal.  3. The mitral valve is normal in structure. Trivial mitral valve regurgitation. No evidence of mitral stenosis.  4. The aortic valve is normal in structure. Aortic valve regurgitation is not visualized. No aortic stenosis is present.  5. The inferior vena cava is normal in size with greater than 50% respiratory variability, suggesting right atrial pressure of 3 mmHg. Conclusion(s)/Recommendation(s): No intracardiac source of embolism detected on this transthoracic study. Consider a transesophageal echocardiogram to exclude  cardiac source of embolism if clinically indicated. FINDINGS  Left Ventricle: Left ventricular ejection fraction, by estimation, is 60 to 65%. The left ventricle has normal function. The left ventricle has no regional wall motion abnormalities. The left ventricular internal cavity size was normal in size. There is  mild left ventricular hypertrophy. Abnormal (paradoxical) septal motion, consistent with left bundle branch block. Left ventricular diastolic parameters are indeterminate. Right Ventricle: The right ventricular size is normal. No increase in right ventricular wall thickness. Right ventricular systolic function is normal. Left Atrium: Left atrial size was normal in size. Right Atrium: Right atrial size was normal in size. Pericardium: There is no evidence of pericardial effusion. Mitral Valve: The mitral valve is normal in structure. Trivial mitral valve regurgitation. No evidence of mitral valve stenosis. Tricuspid Valve: The tricuspid valve is normal in structure. Tricuspid valve regurgitation is not demonstrated. No evidence of tricuspid stenosis. Aortic Valve: The aortic valve is normal in structure. Aortic valve regurgitation is not visualized. No aortic stenosis is present. Aortic valve mean gradient measures 5.0 mmHg. Aortic valve peak gradient measures 10.0 mmHg. Aortic valve area, by VTI measures 1.60 cm. Pulmonic Valve: The pulmonic valve was normal in structure. Pulmonic valve regurgitation is trivial. No evidence of pulmonic stenosis. Aorta: The aortic root is normal in size and structure. Venous: The inferior vena cava is normal in size with greater than 50% respiratory variability, suggesting right atrial pressure of 3 mmHg. IAS/Shunts: No atrial level shunt detected by color flow Doppler.  LEFT VENTRICLE PLAX 2D LVIDd:         4.50 cm LVIDs:  3.00 cm LV PW:         1.30 cm LV IVS:        1.30 cm LVOT diam:     1.80 cm LV SV:         47 LV SV Index:   28 LVOT Area:     2.54 cm  LV  Volumes (MOD) LV vol d, MOD A2C: 71.3 ml LV vol d, MOD A4C: 83.2 ml LV vol s, MOD A2C: 27.2 ml LV vol s, MOD A4C: 32.5 ml LV SV MOD A2C:     44.1 ml LV SV MOD A4C:     83.2 ml LV SV MOD BP:      49.1 ml RIGHT VENTRICLE RV Basal diam:  2.80 cm RV S prime:     15.00 cm/s TAPSE (M-mode): 1.9 cm LEFT ATRIUM             Index        RIGHT ATRIUM           Index LA diam:        3.10 cm 1.85 cm/m   RA Area:     12.20 cm LA Vol (A2C):   40.9 ml 24.46 ml/m  RA Volume:   25.70 ml  15.37 ml/m LA Vol (A4C):   52.9 ml 31.63 ml/m LA Biplane Vol: 47.5 ml 28.40 ml/m  AORTIC VALVE                    PULMONIC VALVE AV Area (Vmax):    1.69 cm     PV Vmax:       1.17 m/s AV Area (Vmean):   1.47 cm     PV Peak grad:  5.5 mmHg AV Area (VTI):     1.60 cm AV Vmax:           158.00 cm/s AV Vmean:          99.300 cm/s AV VTI:            0.293 m AV Peak Grad:      10.0 mmHg AV Mean Grad:      5.0 mmHg LVOT Vmax:         105.00 cm/s LVOT Vmean:        57.200 cm/s LVOT VTI:          0.184 m LVOT/AV VTI ratio: 0.63  AORTA Ao Root diam: 2.70 cm Ao Asc diam:  2.90 cm  SHUNTS Systemic VTI:  0.18 m Systemic Diam: 1.80 cm Oneil Parchment MD Electronically signed by Oneil Parchment MD Signature Date/Time: 08/07/2024/4:34:40 PM    Final     Scheduled Meds:  amLODipine   10 mg Oral Daily   aspirin  EC  81 mg Oral Daily   Chlorhexidine  Gluconate Cloth  6 each Topical Q0600   Chlorhexidine  Gluconate Cloth  6 each Topical Q0600   clopidogrel   75 mg Oral Daily   heparin   2,000 Units Dialysis Once in dialysis   heparin   2,000 Units Dialysis Once in dialysis   heparin   5,000 Units Subcutaneous Q8H   insulin  aspart  0-5 Units Subcutaneous QHS   insulin  aspart  0-6 Units Subcutaneous TID WC   rosuvastatin   10 mg Oral Daily   Continuous Infusions:  anticoagulant sodium citrate        LOS: 3 days   Time spent: 32 minutes  Lonni KANDICE Moose, MD  [facility]  08/09/2024, 9:02 AM   "

## 2024-08-09 NOTE — Plan of Care (Signed)
 Was assisted throughout the day using in-person interpreter as well as video interpretor. Has an uneventful day and no signs of distress and is in dialysis right now.   Problem: Coping: Goal: Ability to adjust to condition or change in health will improve Outcome: Progressing   Problem: Health Behavior/Discharge Planning: Goal: Ability to identify and utilize available resources and services will improve Outcome: Progressing   Problem: Metabolic: Goal: Ability to maintain appropriate glucose levels will improve Outcome: Progressing   Problem: Skin Integrity: Goal: Risk for impaired skin integrity will decrease Outcome: Progressing   Problem: Health Behavior/Discharge Planning: Goal: Ability to manage health-related needs will improve Outcome: Progressing   Problem: Self-Care: Goal: Ability to participate in self-care as condition permits will improve Outcome: Progressing   Problem: Self-Care: Goal: Ability to communicate needs accurately will improve Outcome: Progressing   Problem: Coping: Goal: Level of anxiety will decrease Outcome: Progressing   Problem: Nutrition: Goal: Adequate nutrition will be maintained Outcome: Progressing   Problem: Skin Integrity: Goal: Risk for impaired skin integrity will decrease Outcome: Progressing

## 2024-08-10 LAB — BASIC METABOLIC PANEL WITH GFR
Anion gap: 12 (ref 5–15)
BUN: 22 mg/dL — ABNORMAL HIGH (ref 6–20)
CO2: 29 mmol/L (ref 22–32)
Calcium: 7.7 mg/dL — ABNORMAL LOW (ref 8.9–10.3)
Chloride: 91 mmol/L — ABNORMAL LOW (ref 98–111)
Creatinine, Ser: 6.48 mg/dL — ABNORMAL HIGH (ref 0.61–1.24)
GFR, Estimated: 9 mL/min — ABNORMAL LOW
Glucose, Bld: 116 mg/dL — ABNORMAL HIGH (ref 70–99)
Potassium: 3.9 mmol/L (ref 3.5–5.1)
Sodium: 132 mmol/L — ABNORMAL LOW (ref 135–145)

## 2024-08-10 LAB — CBC WITH DIFFERENTIAL/PLATELET
Abs Immature Granulocytes: 0.03 K/uL (ref 0.00–0.07)
Basophils Absolute: 0.1 K/uL (ref 0.0–0.1)
Basophils Relative: 1 %
Eosinophils Absolute: 0.5 K/uL (ref 0.0–0.5)
Eosinophils Relative: 6 %
HCT: 32.3 % — ABNORMAL LOW (ref 39.0–52.0)
Hemoglobin: 11 g/dL — ABNORMAL LOW (ref 13.0–17.0)
Immature Granulocytes: 0 %
Lymphocytes Relative: 22 %
Lymphs Abs: 1.6 K/uL (ref 0.7–4.0)
MCH: 31.9 pg (ref 26.0–34.0)
MCHC: 34.1 g/dL (ref 30.0–36.0)
MCV: 93.6 fL (ref 80.0–100.0)
Monocytes Absolute: 0.8 K/uL (ref 0.1–1.0)
Monocytes Relative: 11 %
Neutro Abs: 4.5 K/uL (ref 1.7–7.7)
Neutrophils Relative %: 60 %
Platelets: 139 K/uL — ABNORMAL LOW (ref 150–400)
RBC: 3.45 MIL/uL — ABNORMAL LOW (ref 4.22–5.81)
RDW: 13 % (ref 11.5–15.5)
WBC: 7.5 K/uL (ref 4.0–10.5)
nRBC: 0 % (ref 0.0–0.2)

## 2024-08-10 LAB — GLUCOSE, CAPILLARY
Glucose-Capillary: 98 mg/dL (ref 70–99)
Glucose-Capillary: 129 mg/dL — ABNORMAL HIGH (ref 70–99)
Glucose-Capillary: 125 mg/dL — ABNORMAL HIGH (ref 70–99)
Glucose-Capillary: 96 mg/dL (ref 70–99)

## 2024-08-10 LAB — HEPATITIS B SURFACE ANTIBODY, QUANTITATIVE: Hep B S AB Quant (Post): 35.8 m[IU]/mL

## 2024-08-10 MED ORDER — EZETIMIBE 10 MG PO TABS
10.0000 mg | ORAL_TABLET | Freq: Every day | ORAL | Status: DC
  Administered 2024-08-11 – 2024-08-14 (×4): 10 mg via ORAL
  Filled 2024-08-10 (×4): qty 1

## 2024-08-10 MED ORDER — CHLORHEXIDINE GLUCONATE CLOTH 2 % EX PADS
6.0000 | MEDICATED_PAD | Freq: Every day | CUTANEOUS | Status: DC
  Administered 2024-08-11 – 2024-08-14 (×4): 6 via TOPICAL

## 2024-08-10 NOTE — Plan of Care (Signed)
" °  Problem: Health Behavior/Discharge Planning: Goal: Ability to manage health-related needs will improve Outcome: Progressing   Problem: Nutritional: Goal: Maintenance of adequate nutrition will improve Outcome: Progressing   Problem: Ischemic Stroke/TIA Tissue Perfusion: Goal: Complications of ischemic stroke/TIA will be minimized Outcome: Progressing   Problem: Health Behavior/Discharge Planning: Goal: Ability to manage health-related needs will improve Outcome: Progressing   Problem: Nutrition: Goal: Risk of aspiration will decrease Outcome: Progressing   Problem: Health Behavior/Discharge Planning: Goal: Ability to manage health-related needs will improve Outcome: Progressing   Problem: Clinical Measurements: Goal: Will remain free from infection Outcome: Progressing   Problem: Activity: Goal: Risk for activity intolerance will decrease Outcome: Progressing   Problem: Elimination: Goal: Will not experience complications related to urinary retention Outcome: Progressing   Problem: Safety: Goal: Ability to remain free from injury will improve Outcome: Progressing   "

## 2024-08-10 NOTE — Progress Notes (Signed)
 " PROGRESS NOTE    Blake Santana  FMW:[id number] DOB: 03/09/1971 DOA: 08/06/2024 PCP: Leavy Lucas Sula, PA-C   Brief Narrative:    Blake Santana is a 54 y.o. male with medical history significant of ESRD on HD TTS, HFpEF, type 2 diabetes with diabetic retinopathy, anemia of chronic disease, hypertension, hyperlipidemia, PAD presented to the ED with complaints of aphasia, facial/hand weakness/numbness, and dysphagia.  Out of window for TNK at the time of arrival.  Blood pressure 231/94 on arrival but remainder of vital signs stable.  Labs notable for hemoglobin 12.1 (at baseline), sodium 134, potassium 6.4, chloride 94, bicarb 24, ethanol level <15, troponin 45.  EKG without acute ischemic changes.  Brain MRI showing acute left posterior MCA territory infarct.  Patient was given IV hydralazine , IV labetalol , and IV Ativan  in the ED.  Also given insulin /dextrose , calcium  gluconate, albuterol , sodium bicarb, and Lokelma  for hyperkalemia.     Admitted by [facility].   08/07/24: Received hemodialysis today. Evaluated by neurology, stroke workup ongoing. 08/07/24: Repeat MRI increased edema and slight midline shift , MRA with occluded left M2 MCA, severe right and moderate left PCA stenosis   Subjective: Interviewed patient along with the nephrologist in the presence of his wife.  Complains of some dental pain.  Complains of shortness of breath.  Discussed how we would like his blood pressures to remain higher for few days given the cerebral infarct extension.  Not sure how much he understands as he is both receptive and expressive aphasia.  Wife seemed to understand.  Wife felt that he was not choking on food just having difficulty eating it due to pain.   Assessment & Plan:   Principal Problem:   Acute CVA (cerebrovascular accident) (HCC) Active Problems:   Type 2 diabetes mellitus with other diabetic kidney complication (HCC)   Elevated troponin   Hyperkalemia   Hypertensive  emergency     Assessment and Plan   Acute CVA -With extension.  Allow blood pressure to range from 140-180 for the next several days. Looks a bit more alert today but still fairly impaired L MCA territory stroke per MRI.  CT angiogram shows left M2 MCA occlusion, severe PCA P2 stenosis. - Evaluated by neurology.  They have recommended aspirin  and Plavix  for 3 weeks followed by aspirin  alone. - , hemoglobin A1c, echo, PT OT, SLP - LDL 122, prescribed statin     Hyperkalemia ESRD on HD TTS Potassium 6.4 on presentation.  Medically treated in the ER, hemodialysis.  MWF schedule outpatient   Hypertensive emergency In the setting of medication nonadherence.  SBP initially in the 230s, improved to 180s after IV hydralazine  and IV labetalol  in the ED. Given acute CVA, allow permissive hypertension for 24 hours.  On Coreg  and amlodipine  at home,   Elevated troponin, questionable chest pain: Troponin elevated 45 and 49 in the setting of ESRD  Will continue to monitor in telemetry. Dual antiplatelet therapy for acute stroke.   chronic HFpEF Last echo done in January 2020 showing EF 50 to 55%, mild mitral regurgitation, mild tricuspid regurgitation.  Repeat echo ordered in the setting of acute CVA as above.  Patient mildly hypoxic in the ER, currently off supplemental oxygen   Type 2 diabetes Poorly controlled -hemoglobin A1c 10.2 on 02/29/2024.  Repeat A1c pending.  Continue sliding scale insulin  ACHS.   Anemia of chronic disease Hemoglobin stable.   Hyperlipidemia PAD Resume home medications as appropriate once reconciled  Dental Pain? - Ordered Panorex  films of his teeth. - Not alert and strong enough to sit up yet. - He will need to be able to sit up or stand to undergo Panorex imaging.    DVT prophylaxis: heparin  injection 5,000 Units Start: 08/07/24 0600     Code Status: Full Code Family Communication: Wife at the bedside. Disposition Plan: Home with home health versus  SNF for rehab/acute rehab Reason for continuing need for hospitalization: Ongoing rehab services  Objective: Vitals:   08/09/24 2330 08/10/24 0325 08/10/24 0737 08/10/24 1127  BP: (!) 169/87 (!) 165/72 (!) 156/67 (!) 144/70  Pulse: 81 75 70 76  Resp: 17 18 16 16   Temp: 98.3 F (36.8 C) 98 F (36.7 C) 99.4 F (37.4 C) 98.1 F (36.7 C)  TempSrc: Oral  Oral Oral  SpO2: 99% 98% 96% 91%  Weight:      Height:        Intake/Output Summary (Last 24 hours) at 08/10/2024 1134 Last data filed at 08/09/2024 2300 Gross per 24 hour  Intake 100 ml  Output 700 ml  Net -600 ml   Filed Weights   08/09/24 1600 08/09/24 1944  Weight: 67.3 kg 66.8 kg    Examination: Speech is dysarthric. Physical Exam HENT:     Head: Normocephalic and atraumatic.     Mouth/Throat:     Mouth: Mucous membranes are moist.  Eyes:     Extraocular Movements: Extraocular movements intact.  Pulmonary:     Effort: Pulmonary effort is normal.  Abdominal:     Palpations: Abdomen is soft.  Skin:    General: Skin is warm and dry.  Neurological:     Mental Status: He is alert and oriented to person, place, and time.     Motor: Weakness present.     Coordination: Coordination abnormal.  Psychiatric:        Mood and Affect: Mood normal.        Thought Content: Thought content normal.     Data Reviewed: I have personally reviewed following labs and imaging studies  CBC: Recent Labs  Lab 08/06/24 1727 08/06/24 1746 08/08/24 0156 08/10/24 0215  WBC 8.8  --  8.9 7.5  NEUTROABS 5.9  --   --  4.5  HGB 12.1* 12.9* 11.5* 11.0*  HCT 37.3* 38.0* 34.8* 32.3*  MCV 97.9  --  94.6 93.6  PLT 162  --  152 139*   Basic Metabolic Panel: Recent Labs  Lab 08/07/24 0227 08/08/24 0156 08/08/24 1234 08/09/24 0152 08/10/24 0215  NA 136 133* 131* 132* 132*  K 5.7* 5.7* 5.4* 4.9 3.9  CL 95* 91* 90* 89* 91*  CO2 22 28 26 26 29   GLUCOSE 134* 147* 143* 153* 116*  BUN 58* 35* 39* 47* 22*  CREATININE 11.50* 7.95*  9.05* 10.20* 6.48*  CALCIUM  7.9* 7.8* 7.7* 7.4* 7.7*  PHOS  --  5.9*  --   --   --    GFR: Estimated Creatinine Clearance: 10.8 mL/min (A) (by C-G formula based on SCr of 6.48 mg/dL (H)). Liver Function Tests: Recent Labs  Lab 08/06/24 1727 08/08/24 0156  AST 13*  --   ALT 7  --   ALKPHOS 80  --   BILITOT 0.3  --   PROT 7.6  --   ALBUMIN 4.1 3.8   No results for input(s): LIPASE, AMYLASE in the last 168 hours. No results for input(s): AMMONIA in the last 168 hours. Coagulation Profile: Recent Labs  Lab 08/06/24 1727  INR 1.0  Cardiac Enzymes: No results for input(s): CKTOTAL, CKMB, CKMBINDEX, TROPONINI in the last 168 hours. ProBNP, BNP (last 5 results) No results for input(s): PROBNP, BNP in the last 8760 hours. HbA1C: No results for input(s): HGBA1C in the last 72 hours.  CBG: Recent Labs  Lab 08/09/24 0638 08/09/24 1143 08/09/24 2145 08/10/24 0647 08/10/24 1121  GLUCAP 117* 185* 110* 98 129*   Lipid Profile: No results for input(s): CHOL, HDL, LDLCALC, TRIG, CHOLHDL, LDLDIRECT in the last 72 hours.  Thyroid Function Tests: No results for input(s): TSH, T4TOTAL, FREET4, T3FREE, THYROIDAB in the last 72 hours. Anemia Panel: No results for input(s): VITAMINB12, FOLATE, FERRITIN, TIBC, IRON , RETICCTPCT in the last 72 hours. Sepsis Labs: No results for input(s): PROCALCITON, LATICACIDVEN in the last 168 hours.  Recent Results (from the past 240 hours)  MRSA Next Gen by PCR, Nasal     Status: None   Collection Time: 08/09/24  1:20 PM   Specimen: Nasal Mucosa; Nasal Swab  Result Value Ref Range Status   MRSA by PCR Next Gen NOT DETECTED NOT DETECTED Final    Comment: (NOTE) The GeneXpert MRSA Assay (FDA approved for NASAL specimens only), is one component of a comprehensive MRSA colonization surveillance program. It is not intended to diagnose MRSA infection nor to guide or monitor treatment for MRSA  infections. Test performance is not FDA approved in patients less than 30 years old. Performed at [facility], [address] [place], [place] [address]      Radiology Studies: VAS US  LOWER EXTREMITY VENOUS (DVT) Result Date: 08/09/2024  Lower Venous DVT Study Patient Name:  Blake Santana  Date of Exam:   08/09/2024 Medical Rec #: [id number]                 Accession #:    [id number] Date of Birth: 06/11/1971                 Patient Gender: M Patient Age:   59 years Exam Location:  [facility] Procedure:      VAS US  LOWER EXTREMITY VENOUS (DVT) Referring Phys: ARY XU --------------------------------------------------------------------------------  Indications: Stroke.  Risk Factors: None identified. Comparison Study: No prior studies. Performing Technologist: Cordella Collet RVT  Examination Guidelines: A complete evaluation includes B-mode imaging, spectral Doppler, color Doppler, and power Doppler as needed of all accessible portions of each vessel. Bilateral testing is considered an integral part of a complete examination. Limited examinations for reoccurring indications may be performed as noted. The reflux portion of the exam is performed with the patient in reverse Trendelenburg.  +---------+---------------+---------+-----------+----------+--------------+ RIGHT    CompressibilityPhasicitySpontaneityPropertiesThrombus Aging +---------+---------------+---------+-----------+----------+--------------+ CFV      Full           Yes      Yes                                 +---------+---------------+---------+-----------+----------+--------------+ SFJ      Full                                                        +---------+---------------+---------+-----------+----------+--------------+ FV Prox  Full                                                        +---------+---------------+---------+-----------+----------+--------------+  FV Mid   Full                                                         +---------+---------------+---------+-----------+----------+--------------+ FV DistalFull                                                        +---------+---------------+---------+-----------+----------+--------------+ PFV      Full                                                        +---------+---------------+---------+-----------+----------+--------------+ POP      Full           Yes      Yes                                 +---------+---------------+---------+-----------+----------+--------------+ PTV      Full                                                        +---------+---------------+---------+-----------+----------+--------------+ PERO     Full                                                        +---------+---------------+---------+-----------+----------+--------------+   +---------+---------------+---------+-----------+----------+--------------+ LEFT     CompressibilityPhasicitySpontaneityPropertiesThrombus Aging +---------+---------------+---------+-----------+----------+--------------+ CFV      Full           Yes      Yes                                 +---------+---------------+---------+-----------+----------+--------------+ SFJ      Full                                                        +---------+---------------+---------+-----------+----------+--------------+ FV Prox  Full                                                        +---------+---------------+---------+-----------+----------+--------------+ FV Mid   Full                                                        +---------+---------------+---------+-----------+----------+--------------+  FV DistalFull           Yes      Yes                                 +---------+---------------+---------+-----------+----------+--------------+ PFV      Full                                                         +---------+---------------+---------+-----------+----------+--------------+ POP      Full           Yes      Yes                                 +---------+---------------+---------+-----------+----------+--------------+ PTV      Full                                                        +---------+---------------+---------+-----------+----------+--------------+ PERO     Full                                                        +---------+---------------+---------+-----------+----------+--------------+     Summary: RIGHT: - There is no evidence of deep vein thrombosis in the lower extremity.  - No cystic structure found in the popliteal fossa.  LEFT: - There is no evidence of deep vein thrombosis in the lower extremity.  - No cystic structure found in the popliteal fossa.  *See table(s) above for measurements and observations. Electronically signed by Gaile New MD on 08/09/2024 at 7:08:42 PM.    Final    MR ANGIO HEAD WO CONTRAST Result Date: 08/09/2024 EXAM: MR Angiography Head without intravenous Contrast. 08/08/2024 09:52:09 PM TECHNIQUE: Magnetic resonance angiography images of the head without intravenous contrast. Multiplanar 2D and 3D reformatted images are provided for review. COMPARISON: MR Head Without IV Contrast 08/06/2024. CLINICAL HISTORY: Stroke, follow up. FINDINGS: Mild to moderately motion limited study. ANTERIOR CIRCULATION: No significant stenosis of the internal carotid arteries. No significant stenosis of the anterior cerebral arteries. Hypoplastic right A1 ACA. Similar occluded proximal left M2 MCA. No aneurysm. POSTERIOR CIRCULATION: Severe right and moderate left P2 PCA stenosis. No significant stenosis of the basilar artery. No significant stenosis of the vertebral arteries. No aneurysm. IMPRESSION: 1. Similar occluded proximal left M2 MCA. 2. Severe right and moderate left P2 PCA stenosis. Electronically signed by: Gilmore Molt 08/09/2024 03:35 AM EST RP  Workstation: [id number]   MR BRAIN WO CONTRAST Result Date: 08/09/2024 EXAM: MRI BRAIN WITHOUT CONTRAST 08/08/2024 09:38:18 PM TECHNIQUE: Multiplanar multisequence MRI of the head/brain was performed without the administration of intravenous contrast. COMPARISON: MRI head 08/06/2024 CLINICAL HISTORY: Stroke, follow up. FINDINGS: BRAIN AND VENTRICLES: Increasingly confluent and expanded left posterior MCA territory infarct. Increased edema with slight (2 mm) rightward midline shift. Numerous punctate foci of susceptibility artifact throughout the brain. No intracranial hemorrhage. No mass. No  hydrocephalus. The sella is unremarkable. ORBITS: No acute abnormality. SINUSES AND MASTOIDS: No acute abnormality. BONES AND SOFT TISSUES: Normal marrow signal. No acute soft tissue abnormality. IMPRESSION: 1. In comparison to 08/06/2024 MRI , increasingly confluent and expanded left posterior MCA territory infarct. Increased edema with slight (2 mm) rightward midline shift. 2. Numerous punctate foci of susceptibility artifact throughout the brain, possibly prior micro emboli. Electronically signed by: Gilmore Molt 08/09/2024 03:27 AM EST RP Workstation: [id number]   DG Shoulder Right Port Result Date: 08/08/2024 CLINICAL DATA:  Right shoulder pain. EXAM: RIGHT SHOULDER - 1 VIEW COMPARISON:  None Available. FINDINGS: There is no evidence of acute fracture or dislocation. A small chronic appearing deformity is seen along the dorsal aspect of the distal right clavicle. Soft tissues are unremarkable. Mild to moderate severity atelectasis and/or infiltrate is noted within the visualized portion of the right lung base. IMPRESSION: 1. No acute osseous abnormality. Electronically Signed   By: Suzen Dials M.D.   On: 08/08/2024 20:20    Scheduled Meds:  amLODipine   10 mg Oral Daily   aspirin  EC  81 mg Oral Daily   Chlorhexidine  Gluconate Cloth  6 each Topical Q0600   Chlorhexidine  Gluconate Cloth  6 each Topical  Q0600   clopidogrel   75 mg Oral Daily   heparin   5,000 Units Subcutaneous Q8H   insulin  aspart  0-5 Units Subcutaneous QHS   insulin  aspart  0-6 Units Subcutaneous TID WC   rosuvastatin   10 mg Oral Daily   Continuous Infusions:     LOS: 4 days   Time spent: 32 minutes  Lonni KANDICE Moose, MD  [facility]  08/10/2024, 11:34 AM   "

## 2024-08-10 NOTE — Progress Notes (Signed)
 STROKE TEAM PROGRESS NOTE   SUBJECTIVE (INTERVAL HISTORY) Wife, son and speech therapist are at the bedside.  Pt reclining in bed, grimace on chewing, swallowing. Hard to locate the lesion due to aphasia and spanish speaking. However, seems like he is having toothache instead of oral infection or laryngeal issue.    OBJECTIVE Temp:  [97.8 F (36.6 C)-100.7 F (38.2 C)] 98.1 F (36.7 C) (01/08 1127) Pulse Rate:  [68-81] 76 (01/08 1127) Cardiac Rhythm: Normal sinus rhythm (01/08 0745) Resp:  [13-18] 16 (01/08 1127) BP: (144-190)/(67-101) 144/70 (01/08 1127) SpO2:  [91 %-100 %] 91 % (01/08 1127) Weight:  [66.8 kg-67.3 kg] 66.8 kg (01/07 1944)  Recent Labs  Lab 08/09/24 0638 08/09/24 1143 08/09/24 2145 08/10/24 0647 08/10/24 1121  GLUCAP 117* 185* 110* 98 129*   Recent Labs  Lab 08/07/24 0227 08/08/24 0156 08/08/24 1234 08/09/24 0152 08/10/24 0215  NA 136 133* 131* 132* 132*  K 5.7* 5.7* 5.4* 4.9 3.9  CL 95* 91* 90* 89* 91*  CO2 22 28 26 26 29   GLUCOSE 134* 147* 143* 153* 116*  BUN 58* 35* 39* 47* 22*  CREATININE 11.50* 7.95* 9.05* 10.20* 6.48*  CALCIUM  7.9* 7.8* 7.7* 7.4* 7.7*  PHOS  --  5.9*  --   --   --    Recent Labs  Lab 08/06/24 1727 08/08/24 0156  AST 13*  --   ALT 7  --   ALKPHOS 80  --   BILITOT 0.3  --   PROT 7.6  --   ALBUMIN 4.1 3.8   Recent Labs  Lab 08/06/24 1727 08/06/24 1746 08/08/24 0156 08/10/24 0215  WBC 8.8  --  8.9 7.5  NEUTROABS 5.9  --   --  4.5  HGB 12.1* 12.9* 11.5* 11.0*  HCT 37.3* 38.0* 34.8* 32.3*  MCV 97.9  --  94.6 93.6  PLT 162  --  152 139*   No results for input(s): CKTOTAL, CKMB, CKMBINDEX, TROPONINI in the last 168 hours. No results for input(s): LABPROT, INR in the last 72 hours.  No results for input(s): COLORURINE, LABSPEC, PHURINE, GLUCOSEU, HGBUR, BILIRUBINUR, KETONESUR, PROTEINUR, UROBILINOGEN, NITRITE, LEUKOCYTESUR in the last 72 hours.  Invalid input(s): APPERANCEUR      Component Value Date/Time   CHOL 189 08/07/2024 0227   CHOL 248 (H) 02/29/2024 1119   TRIG 169 (H) 08/07/2024 0227   HDL 33 (L) 08/07/2024 0227   HDL 30 (L) 02/29/2024 1119   CHOLHDL 5.7 08/07/2024 0227   VLDL 34 08/07/2024 0227   LDLCALC 122 (H) 08/07/2024 0227   LDLCALC 174 (H) 02/29/2024 1119   Lab Results  Component Value Date   HGBA1C 8.5 (H) 08/07/2024   No results found for: LABOPIA, COCAINSCRNUR, LABBENZ, AMPHETMU, THCU, LABBARB  Recent Labs  Lab 08/06/24 1727  ETH <15    I have personally reviewed the radiological images below and agree with the radiology interpretations.  VAS US  LOWER EXTREMITY VENOUS (DVT) Result Date: 08/09/2024  Lower Venous DVT Study Patient Name:  Blake Santana  Date of Exam:   08/09/2024 Medical Rec #: [id number]                 Accession #:    [id number] Date of Birth: 08/04/1970                 Patient Gender: M Patient Age:   54 years Exam Location:  [facility] Procedure:      VAS US  LOWER EXTREMITY VENOUS (DVT)  Referring Phys: Blake Santana --------------------------------------------------------------------------------  Indications: Stroke.  Risk Factors: None identified. Comparison Study: No prior studies. Performing Technologist: Cordella Collet RVT  Examination Guidelines: A complete evaluation includes B-mode imaging, spectral Doppler, color Doppler, and power Doppler as needed of all accessible portions of each vessel. Bilateral testing is considered an integral part of a complete examination. Limited examinations for reoccurring indications may be performed as noted. The reflux portion of the exam is performed with the patient in reverse Trendelenburg.  +---------+---------------+---------+-----------+----------+--------------+ RIGHT    CompressibilityPhasicitySpontaneityPropertiesThrombus Aging +---------+---------------+---------+-----------+----------+--------------+ CFV      Full           Yes      Yes                                  +---------+---------------+---------+-----------+----------+--------------+ SFJ      Full                                                        +---------+---------------+---------+-----------+----------+--------------+ FV Prox  Full                                                        +---------+---------------+---------+-----------+----------+--------------+ FV Mid   Full                                                        +---------+---------------+---------+-----------+----------+--------------+ FV DistalFull                                                        +---------+---------------+---------+-----------+----------+--------------+ PFV      Full                                                        +---------+---------------+---------+-----------+----------+--------------+ POP      Full           Yes      Yes                                 +---------+---------------+---------+-----------+----------+--------------+ PTV      Full                                                        +---------+---------------+---------+-----------+----------+--------------+ PERO     Full                                                        +---------+---------------+---------+-----------+----------+--------------+   +---------+---------------+---------+-----------+----------+--------------+  LEFT     CompressibilityPhasicitySpontaneityPropertiesThrombus Aging +---------+---------------+---------+-----------+----------+--------------+ CFV      Full           Yes      Yes                                 +---------+---------------+---------+-----------+----------+--------------+ SFJ      Full                                                        +---------+---------------+---------+-----------+----------+--------------+ FV Prox  Full                                                         +---------+---------------+---------+-----------+----------+--------------+ FV Mid   Full                                                        +---------+---------------+---------+-----------+----------+--------------+ FV DistalFull           Yes      Yes                                 +---------+---------------+---------+-----------+----------+--------------+ PFV      Full                                                        +---------+---------------+---------+-----------+----------+--------------+ POP      Full           Yes      Yes                                 +---------+---------------+---------+-----------+----------+--------------+ PTV      Full                                                        +---------+---------------+---------+-----------+----------+--------------+ PERO     Full                                                        +---------+---------------+---------+-----------+----------+--------------+     Summary: RIGHT: - There is no evidence of deep vein thrombosis in the lower extremity.  - No cystic structure found in the popliteal fossa.  LEFT: - There is no evidence of deep vein thrombosis in the lower extremity.  - No cystic structure found in the popliteal  fossa.  *See table(s) above for measurements and observations. Electronically signed by Gaile New MD on 08/09/2024 at 7:08:42 PM.    Final    MR ANGIO HEAD WO CONTRAST Result Date: 08/09/2024 EXAM: MR Angiography Head without intravenous Contrast. 08/08/2024 09:52:09 PM TECHNIQUE: Magnetic resonance angiography images of the head without intravenous contrast. Multiplanar 2D and 3D reformatted images are provided for review. COMPARISON: MR Head Without IV Contrast 08/06/2024. CLINICAL HISTORY: Stroke, follow up. FINDINGS: Mild to moderately motion limited study. ANTERIOR CIRCULATION: No significant stenosis of the internal carotid arteries. No significant stenosis of the anterior  cerebral arteries. Hypoplastic right A1 ACA. Similar occluded proximal left M2 MCA. No aneurysm. POSTERIOR CIRCULATION: Severe right and moderate left P2 PCA stenosis. No significant stenosis of the basilar artery. No significant stenosis of the vertebral arteries. No aneurysm. IMPRESSION: 1. Similar occluded proximal left M2 MCA. 2. Severe right and moderate left P2 PCA stenosis. Electronically signed by: Gilmore Molt 08/09/2024 03:35 AM EST RP Workstation: [id number]   MR BRAIN WO CONTRAST Result Date: 08/09/2024 EXAM: MRI BRAIN WITHOUT CONTRAST 08/08/2024 09:38:18 PM TECHNIQUE: Multiplanar multisequence MRI of the head/brain was performed without the administration of intravenous contrast. COMPARISON: MRI head 08/06/2024 CLINICAL HISTORY: Stroke, follow up. FINDINGS: BRAIN AND VENTRICLES: Increasingly confluent and expanded left posterior MCA territory infarct. Increased edema with slight (2 mm) rightward midline shift. Numerous punctate foci of susceptibility artifact throughout the brain. No intracranial hemorrhage. No mass. No hydrocephalus. The sella is unremarkable. ORBITS: No acute abnormality. SINUSES AND MASTOIDS: No acute abnormality. BONES AND SOFT TISSUES: Normal marrow signal. No acute soft tissue abnormality. IMPRESSION: 1. In comparison to 08/06/2024 MRI , increasingly confluent and expanded left posterior MCA territory infarct. Increased edema with slight (2 mm) rightward midline shift. 2. Numerous punctate foci of susceptibility artifact throughout the brain, possibly prior micro emboli. Electronically signed by: Gilmore Molt 08/09/2024 03:27 AM EST RP Workstation: [id number]   DG Shoulder Right Port Result Date: 08/08/2024 CLINICAL DATA:  Right shoulder pain. EXAM: RIGHT SHOULDER - 1 VIEW COMPARISON:  None Available. FINDINGS: There is no evidence of acute fracture or dislocation. A small chronic appearing deformity is seen along the dorsal aspect of the distal right clavicle. Soft  tissues are unremarkable. Mild to moderate severity atelectasis and/or infiltrate is noted within the visualized portion of the right lung base. IMPRESSION: 1. No acute osseous abnormality. Electronically Signed   By: Suzen Dials M.D.   On: 08/08/2024 20:20   ECHOCARDIOGRAM COMPLETE Result Date: 08/07/2024    ECHOCARDIOGRAM REPORT   Patient Name:   Deryck Hippler Santana Date of Exam: 08/07/2024 Medical Rec #:  [id number]                Height:       62.0 in Accession #:    [id number]               Weight:       146.0 lb Date of Birth:  1971/06/11                BSA:          1.672 m Patient Age:    53 years                 BP:           205/100 mmHg Patient Gender: M                        HR:  93 bpm. Exam Location:  Inpatient Procedure: 2D Echo, Cardiac Doppler and Color Doppler (Both Spectral and Color            Flow Doppler were utilized during procedure). Indications:    Stroke I63.9  History:        Patient has prior history of Echocardiogram examinations, most                 recent 04/03/2017. Risk Factors:Hypertension.  Sonographer:    Nathanel Devonshire Referring Phys: EDITHA RAM IMPRESSIONS  1. Left ventricular ejection fraction, by estimation, is 60 to 65%. The left ventricle has normal function. The left ventricle has no regional wall motion abnormalities. There is mild left ventricular hypertrophy. Left ventricular diastolic parameters are indeterminate.  2. Right ventricular systolic function is normal. The right ventricular size is normal.  3. The mitral valve is normal in structure. Trivial mitral valve regurgitation. No evidence of mitral stenosis.  4. The aortic valve is normal in structure. Aortic valve regurgitation is not visualized. No aortic stenosis is present.  5. The inferior vena cava is normal in size with greater than 50% respiratory variability, suggesting right atrial pressure of 3 mmHg. Conclusion(s)/Recommendation(s): No intracardiac source of embolism detected on  this transthoracic study. Consider a transesophageal echocardiogram to exclude cardiac source of embolism if clinically indicated. FINDINGS  Left Ventricle: Left ventricular ejection fraction, by estimation, is 60 to 65%. The left ventricle has normal function. The left ventricle has no regional wall motion abnormalities. The left ventricular internal cavity size was normal in size. There is  mild left ventricular hypertrophy. Abnormal (paradoxical) septal motion, consistent with left bundle branch block. Left ventricular diastolic parameters are indeterminate. Right Ventricle: The right ventricular size is normal. No increase in right ventricular wall thickness. Right ventricular systolic function is normal. Left Atrium: Left atrial size was normal in size. Right Atrium: Right atrial size was normal in size. Pericardium: There is no evidence of pericardial effusion. Mitral Valve: The mitral valve is normal in structure. Trivial mitral valve regurgitation. No evidence of mitral valve stenosis. Tricuspid Valve: The tricuspid valve is normal in structure. Tricuspid valve regurgitation is not demonstrated. No evidence of tricuspid stenosis. Aortic Valve: The aortic valve is normal in structure. Aortic valve regurgitation is not visualized. No aortic stenosis is present. Aortic valve mean gradient measures 5.0 mmHg. Aortic valve peak gradient measures 10.0 mmHg. Aortic valve area, by VTI measures 1.60 cm. Pulmonic Valve: The pulmonic valve was normal in structure. Pulmonic valve regurgitation is trivial. No evidence of pulmonic stenosis. Aorta: The aortic root is normal in size and structure. Venous: The inferior vena cava is normal in size with greater than 50% respiratory variability, suggesting right atrial pressure of 3 mmHg. IAS/Shunts: No atrial level shunt detected by color flow Doppler.  LEFT VENTRICLE PLAX 2D LVIDd:         4.50 cm LVIDs:         3.00 cm LV PW:         1.30 cm LV IVS:        1.30 cm LVOT diam:      1.80 cm LV SV:         47 LV SV Index:   28 LVOT Area:     2.54 cm  LV Volumes (MOD) LV vol d, MOD A2C: 71.3 ml LV vol d, MOD A4C: 83.2 ml LV vol s, MOD A2C: 27.2 ml LV vol s, MOD A4C: 32.5 ml LV SV MOD A2C:  44.1 ml LV SV MOD A4C:     83.2 ml LV SV MOD BP:      49.1 ml RIGHT VENTRICLE RV Basal diam:  2.80 cm RV S prime:     15.00 cm/s TAPSE (M-mode): 1.9 cm LEFT ATRIUM             Index        RIGHT ATRIUM           Index LA diam:        3.10 cm 1.85 cm/m   RA Area:     12.20 cm LA Vol (A2C):   40.9 ml 24.46 ml/m  RA Volume:   25.70 ml  15.37 ml/m LA Vol (A4C):   52.9 ml 31.63 ml/m LA Biplane Vol: 47.5 ml 28.40 ml/m  AORTIC VALVE                    PULMONIC VALVE AV Area (Vmax):    1.69 cm     PV Vmax:       1.17 m/s AV Area (Vmean):   1.47 cm     PV Peak grad:  5.5 mmHg AV Area (VTI):     1.60 cm AV Vmax:           158.00 cm/s AV Vmean:          99.300 cm/s AV VTI:            0.293 m AV Peak Grad:      10.0 mmHg AV Mean Grad:      5.0 mmHg LVOT Vmax:         105.00 cm/s LVOT Vmean:        57.200 cm/s LVOT VTI:          0.184 m LVOT/AV VTI ratio: 0.63  AORTA Ao Root diam: 2.70 cm Ao Asc diam:  2.90 cm  SHUNTS Systemic VTI:  0.18 m Systemic Diam: 1.80 cm Blake Parchment MD Electronically signed by Blake Parchment MD Signature Date/Time: 08/07/2024/4:34:40 PM    Final    CT VENOGRAM HEAD Result Date: 08/07/2024 EXAM: CT VENOGRAM WITH CONTRAST 08/07/2024 03:42:47 AM TECHNIQUE: CT venogram of the head/brain was performed with the administration of intravenous contrast. Multiplanar reformatted images are provided for review. MIP images are provided for review. Automated exposure control, iterative reconstruction, and/or weight based adjustment of the mA/kV was utilized to reduce the radiation dose to as low as reasonably achievable. COMPARISON: None available. CLINICAL HISTORY: Dural venous sinus thrombosis suspected FINDINGS: No dural venous sinus thrombosis. No significant stenosis. IMPRESSION: 1. No dural  venous sinus thrombosis. Electronically signed by: Gilmore Molt 08/07/2024 04:03 AM EST RP Workstation: [id number]   CT ANGIO HEAD NECK W WO CM Result Date: 08/07/2024 EXAM: CTA HEAD AND NECK WITH AND WITHOUT 08/07/2024 03:42:47 AM TECHNIQUE: CTA of the head and neck was performed with and without the administration of intravenous contrast. Multiplanar 2D and/or 3D reformatted images are provided for review. Automated exposure control, iterative reconstruction, and/or weight based adjustment of the mA/kV was utilized to reduce the radiation dose to as low as reasonably achievable. Stenosis of the internal carotid arteries measured using NASCET criteria. COMPARISON: None available CLINICAL HISTORY: Neuro deficit, acute, stroke suspected Neuro deficit, acute, stroke suspected FINDINGS: AORTIC ARCH AND ARCH VESSELS: No dissection or arterial injury. No significant stenosis of the brachiocephalic or subclavian arteries. CERVICAL CAROTID ARTERIES: No dissection, arterial injury, or hemodynamically significant stenosis by NASCET criteria. CERVICAL VERTEBRAL ARTERIES: No dissection, arterial  injury, or significant stenosis. LUNGS AND MEDIASTINUM: Unremarkable. SOFT TISSUES: No acute abnormality. BONES: No acute abnormality. ANTERIOR CIRCULATION: No significant stenosis of the internal carotid arteries. No significant stenosis of the anterior cerebral arteries. Occluded proximal left M2 MCA branch. No aneurysm. POSTERIOR CIRCULATION: PCAs are patent. Severe bilateral P2 PCA stenosis. No significant stenosis of the basilar artery. Vertebral arteries are patent. Moderate right intradural vertebral artery stenosis. No aneurysm. OTHER: No dural venous sinus thrombosis on this non-dedicated study. Findings discussed with Dr. Khaliqdina via telephone at 3:55 AM. IMPRESSION: 1. Occluded proximal left M2 MCA branch. 2. Severe bilateral P2 PCA stenosis. 3. Moderate right intradural vertebral artery stenosis. Electronically  signed by: Gilmore Molt 08/07/2024 04:00 AM EST RP Workstation: [id number]   DG CHEST PORT 1 VIEW Result Date: 08/07/2024 EXAM: 1 VIEW(S) XRAY OF THE CHEST 08/07/2024 02:24:13 AM COMPARISON: 11/17/2021 CLINICAL HISTORY: Hypoxia [id number] Hypoxia FINDINGS: LUNGS AND PLEURA: Low lung volumes. No focal pulmonary opacity. No pleural effusion. No pneumothorax. HEART AND MEDIASTINUM: Cardiomegaly with vascular congestion. BONES AND SOFT TISSUES: No acute osseous abnormality. IMPRESSION: 1. Cardiomegaly with vascular congestion. 2. Low lung volumes. Electronically signed by: Franky Crease MD 08/07/2024 02:26 AM EST RP Workstation: [id number]   MR BRAIN WO CONTRAST Result Date: 08/06/2024 EXAM: MRI BRAIN WITHOUT CONTRAST 08/06/2024 09:36:57 PM TECHNIQUE: Multiplanar multisequence MRI of the head/brain was performed without the administration of intravenous contrast. COMPARISON: CT head earlier today. CLINICAL HISTORY: Neuro deficit, acute, stroke suspected FINDINGS: BRAIN AND VENTRICLES: Acute left posterior MCA territory infarct involving the left anterior temporal lobe, overlying parietal lobe and posterior left insula. Edema without mass effect. Numerous punctate foci of susceptibility artifact throughout the brain. No intracranial hemorrhage. No mass. No midline shift. No hydrocephalus. The sella is unremarkable. Normal flow voids. ORBITS: No acute abnormality. SINUSES AND MASTOIDS: No acute abnormality. BONES AND SOFT TISSUES: Normal marrow signal. No acute soft tissue abnormality. IMPRESSION: 1. Acute left posterior MCA territory infarct. 2. Numerous punctate foci of susceptibility artifact throughout the brain, possibly prior micro emboli. Electronically signed by: Gilmore Molt 08/06/2024 09:54 PM EST RP Workstation: [id number]   CT HEAD WO CONTRAST Result Date: 08/06/2024 CLINICAL DATA:  Altered aphasia EXAM: CT HEAD WITHOUT CONTRAST TECHNIQUE: Contiguous axial images were obtained from the base of the skull  through the vertex without intravenous contrast. RADIATION DOSE REDUCTION: This exam was performed according to the departmental dose-optimization program which includes automated exposure control, adjustment of the mA and/or kV according to patient size and/or use of iterative reconstruction technique. COMPARISON:  CT brain 11/17/2021 FINDINGS: Brain: Focal cortical and subcortical hypodensity at the left temporoparietal lobe. No hemorrhage. No mass. No significant mass effect or midline shift. The ventricles are nonenlarged Vascular: No hyperdense vessels. Carotid and vertebral calcification. Extensive small vessel calcification. Skull: No fracture Sinuses/Orbits: No acute finding. Other: None IMPRESSION: Focal cortical and subcortical hypodensity at the left temporoparietal lobe, suspicious for acute to subacute infarct. No hemorrhage. No significant mass effect or midline shift. Electronically Signed   By: Luke Bun M.D.   On: 08/06/2024 18:09     PHYSICAL EXAM  Temp:  [97.8 F (36.6 C)-100.7 F (38.2 C)] 98.1 F (36.7 C) (01/08 1127) Pulse Rate:  [68-81] 76 (01/08 1127) Resp:  [13-18] 16 (01/08 1127) BP: (144-190)/(67-101) 144/70 (01/08 1127) SpO2:  [91 %-100 %] 91 % (01/08 1127) Weight:  [66.8 kg-67.3 kg] 66.8 kg (01/07 1944)  General - Well nourished, well developed, in mild acute distress from orofacial pain  Ophthalmologic -  fundi not visualized due to noncooperation.  Cardiovascular - Regular rhythm and rate.  Neuro - Pt is awake, alert, eyes open, global aphasia, only mumbles without meaningful words. Not following commands, but seems communicating with family by body language OK. No gaze palsy, able to track bilaterally but incomplete on both directions. Not consistently blinking to visual threat bilaterally but seems blinking more on the left and less on the right, PERRL. R mild facial droop. Tongue protrusion not cooperative. RUE 0/5. RLE drift to bed beyond 5 sec. LUE and LLE  5/5. Sensation, coordination not cooperative and gait not tested.    ASSESSMENT/PLAN Blake Santana is a 54 y.o. male with history of ESRD on HD, hypertension, hyperlipidemia, diabetes with retinopathy, PAD admitted for global aphasia. No TNK given due to outside window.    Stroke with extension:  left MCA moderately large infarct, likely secondary to large vessel disease, can not rule out cardioembolic source CT left temporoparietal acute infarct CTA head and neck left M2 occlusion, severe bilateral P2 stenosis, right V4 moderate stenosis CTV negative MRI Acute left posterior MCA territory infarct. Numerous punctate foci of susceptibility artifact throughout the brain, possibly prior micro emboli. Limited MRI repeat showed left MCA infarct extended to moderately large size of stroke MRA Similar occluded proximal left M2 MCA. Severe right and moderate left P2 PCA stenosis. 2D Echo EF 60 to 65% LE venous Doppler no DVT Will consider 30 day monitoring as outpt LDL 122 HgbA1c 8.5 Heparin  subcu for VTE prophylaxis No antithrombotic prior to admission, now on aspirin  81 mg daily and clopidogrel  75 mg daily DAPT for 3 months and then aspirin  alone given M2 occlusion Ongoing aggressive stroke risk factor management Therapy recommendations: CIR Disposition: Pending  Diabetes HgbA1c 8.5, goal < 7.0 Uncontrolled CBG monitoring SSI DM education and close PCP follow up  Hypertension Unstable on the high end Hold off BP meds for now Permissive hypertension for now given the worsening symptoms with BP goal 140-180  Hyperlipidemia Home meds: Crestor  10 LDL 122, goal < 70 Now on Crestor  10 which is the highest dose for ESRD patient Add zetia  10 Continue statin and zetia  at discharge  Orofacial pain grimace on chewing, swallowing.  Hard to locate the lesion due to aphasia. No obvious lesion in oral cavity. Concerning for toothache instead of oral or laryngeal issue.   Primary team aware, defer to primary for management  Other Stroke Risk Factors PAD  Other Active Problems ESRD on dialysis R shoulder pain - X-ray neg  Hospital day # 4  I discussed with Dr. Maranda. I spent extensive total face-to-face time with the patient and family, evaluating orofacial pain, reviewing test results, images and medication, and discussing the diagnosis, treatment plan and potential prognosis. This patient's care requiresreview of multiple databases, neurological assessment, discussion with family, other specialists and medical decision making of high complexity.  Blake Cummins, MD PhD Stroke Neurology 08/10/2024 1:54 PM    To contact Stroke Continuity provider, please refer to [email or web address]. After hours, contact General Neurology

## 2024-08-10 NOTE — Progress Notes (Signed)
 Speech Language Pathology Treatment: Dysphagia  Patient Details Name: Athel Merriweather MRN: [id number] DOB: 1971/02/10 Today's Date: 08/10/2024 Time: 8794-8749 SLP Time Calculation (min) (ACUTE ONLY): 45 min  Assessment / Plan / Recommendation Clinical Impression  Pt seen at bedside for skilled ST intervention targeting goals for safe swallow and tolerance of least restrictive diet. Pt's wife and son were at bedside during this session. SLP set up suction to facilitate thorough oral care. Oral care completed. Visualization of oral cavity revealed intact natural dentition, mouth pink, moist and healthy in appearance without obvious irritation, swelling, or bleeding noted. MD arrived midway through session.   Pt accepted boluses of puree via teaspoon, and thin liquid via straw. Initial bolus of magic cup resulted in poor oral manipulation, as if pt was unsure what to do with the bolus. Suspect delayed swallow reflex. Subsequent presentations resulted in more timely oral prep with adequate laryngeal elevation per palpation. Mild right anterior leakage noted. Pt was noted to grimace during the swallow, and indicated to wife/son that he was experiencing pain in his face and teeth, and when he swallows. No overt s/s aspiration observed during PO presentations. Pt son indicated pt wanted to spit after PO presentations. Oral suction removed minimal secretions only.   RN reports intermittent difficulty with thin liquid and whole meds with puree. Recommend continue with puree diet and thin liquids, meds crushed in puree. Safe swallow precautions posted at [facility]. RN/MD informed. ST will continue to follow to assess diet tolerance, continue education, determine readiness for advanced textures and/or need for instrumental assessment.   HPI HPI: Pt is 54 y.o. male presented to [facility] on 08/07/23 for aphasia. MRI showed acute lt posterior MCA infarct. PMH - ESRD on HD, DM, Diabetic retinopahty, HTN.      SLP  Plan  Continue with current plan of care        Swallow Evaluation Recommendations    Continue puree/thin liquid, meds crushed in puree, 1:1 assist with all PO intake.     Recommendations  Diet recommendations: Dysphagia 1 (puree);Thin liquid Liquids provided via: Cup;Straw Medication Administration: Crushed with puree Supervision: Patient able to self feed;Full supervision/cueing for compensatory strategies;Staff to assist with self feeding Compensations: Minimize environmental distractions;Slow rate;Small sips/bites Postural Changes and/or Swallow Maneuvers: Seated upright 90 degrees;Upright 30-60 min after meal        Oral care BID   Frequent or constant Supervision/Assistance Dysphagia, unspecified (R13.10)     Continue with current plan of care    Anya Murphey B. Dory, MSP, CCC-SLP Speech Language Pathologist Office: [phone]  Dory Caprice Daring 08/10/2024, 12:57 PM

## 2024-08-10 NOTE — Progress Notes (Signed)
" ° °  Inpatient Rehabilitation Admissions Coordinator   Met with patient, spouse and nursing at bedside utilizing interpreter Sarahi # [phone]. Extensive 45 minute discussion concerning caregiver supports after a possible CIR admit, goals and expectations. Spouse reports continued pain noted with his teeth, swallowing and right arm.I discussed the need to clarify that with upcoming foreclosure proceedings from 1/4 court date that they have a dispo home. She states that if needed, they can stay with a neighbor. She and 3 children can provide 24/7 assist as she works 6-9 pm daily but kids can provide care, etc. I will discuss case with rehab team and follow up tomorrow . He is not eating per wife.  Heron Leavell, RN, MSN Rehab Admissions Coordinator [phone] 08/10/2024 11:42 AM  "

## 2024-08-10 NOTE — PMR Pre-admission (Signed)
 PMR Admission Coordinator Pre-Admission Assessment  Patient: Blake Santana is an 54 y.o., male MRN: [id number] DOB: 1970-11-16 Height: 5' 2 (157.5 cm) Weight: 68.6 kg  Insurance Information HMO:     PPO:      PCP:      IPA:      80/20:      OTHER:  PRIMARY: self pay    Self-Pay estimate given on 08/14/24  Policy#:       Subscriber:  CM Name:       Phone#:      Fax#:  Pre-Cert#:       Employer:  Benefits:  Phone #:      Name:  Eff. Date:      Deduct:       Out of Pocket Max:       Life Max:  CIR:       SNF:  Outpatient:      Co-Pay:  Home Health:       Co-Pay:  DME:      Co-Pay:  Providers:   Not a [place]  resident and without a green card. Not eligible for Medicaid  SECONDARY:       Policy#:      Phone#:   Artist:       Phone#:   The Best Boy for patients in Inpatient Rehabilitation Facilities with attached Privacy Act Statement-Health Care Records was provided and verbally reviewed with: Family  Emergency Contact Information Contact Information     Name Relation Home Work Mobile   Olmda-Gonzalez,Gladys Spouse [phone]        Other Contacts     Name Relation Home Work Mobile   [facility]  [phone]    Jaycee Settle Daughter   [phone]      Current Medical History  Patient Admitting Diagnosis: CVA  History of Present Illness:  Pt is a 54 year old male with medical hx significant for: ESRD on HD MWF, type II diabetes with diabetic retinopathy, HTN, anemia of chronic disease, hyperlipidemia, PAD, CHF.  Pt presented to [facility] on 08/07/24 d/t concerns for aphasia and left-sided weakness to left face and hand. Pt had episode of dysphagia and slurred speech the night prior to presentation. Pt hypertensive with systolic of 240. Pt given labetalol  without improvement then given hydralazine  which decreased BP to 180. Pt also noted to be hyperkalemic. CT showed focal cortical and  subcortical hyperdensity at left temporal parietal lobe.SABRA MRI showed left posterior MCA stroke. Nephrology and neurology consulted. CTA showed left M2 MCA occlusion, severe PCA P2 stenosis. Pt reported RUE weakness on 1/6. X-ray and repeat MRI ordered. Repeat MRI showed increased edema and slight midline shift. Right shoulder x-ray negative. Panorex ordered d/t dental pain. Noted resorption of maxilla and prominent periapical erosions involving tooth 1 in keeping with advanced periodontal disease. Therapy evaluations completed and CIR recommended d/t pt's deficits in functional mobility.   Complete NIHSS TOTAL: 14  Patient's medical record from [facility] has been reviewed by the rehabilitation admission coordinator and physician.  Past Medical History  Past Medical History:  Diagnosis Date   Acute respiratory failure with hypoxia (HCC) 09/07/2018   Anemia of chronic kidney failure 03/11/2017   Cataracts, bilateral 2019   Chest pain 09/01/2018   minimal luminal irregularities   Chronic diastolic CHF (congestive heart failure) (HCC)    Chronic kidney disease 03/11/2017   Dialysis Tu/Th/Sa   COVID 2022   hospitalized   Diabetes mellitus without  complication (HCC) 1999   Edema 01/14/2017   Headache    Hypercholesterolemia    Hypertension 03/11/2017   Hypertensive retinopathy of both eyes 2019   Iron  deficiency anemia 06/18/2016   Pneumonia 09/07/2018   Proliferative diabetic retinopathy with macular edema (HCC) 2019   Retinal hemorrhage, bilateral and left vitreous hemorrhage.  Being treated with Lucentis.  Dr. Raj   RSV (respiratory syncytial virus infection) 08/2017   Has the patient had major surgery during 100 days prior to admission? No  Family History   family history includes Alcohol abuse in his father; Diabetes in his father, mother, and sister; Hypertension in his mother; Kidney disease in his father and mother.  Current Medications Current  Medications[1]  Patients Current Diet:  Diet Order             DIET - DYS 1 Room service appropriate? 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 No  Prior Activity Level Community (5-7x/wk): Independent, not driving due ot basline visual issues, no AD  Prior Functional Level Self Care: Did the patient need help bathing, dressing, using the toilet or eating? Independent  Indoor Mobility: Did the patient need assistance with walking from room to room (with or without device)? Independent  Stairs: Did the patient need assistance with internal or external stairs (with or without device)? Independent  Functional Cognition: Did the patient need help planning regular tasks such as shopping or remembering to take medications? Independent  Patient Information Are you of Hispanic, Latino/a,or Spanish origin?: X. Patient unable to respond Gerianne answered; globally aphasic) What is your race?: Z. None of the above (per Kenneth) Do you need or want an interpreter to communicate with a doctor or health care staff?: 9. Unable to respond Gerianne answered yes) Patient information obtained via proxy : from partner, Kenneth  Patient's Response To:  Health Literacy and Transportation Is the patient able to respond to health literacy and transportation needs?: No Health Literacy - How often do you need to have someone help you when you read instructions, pamphlets, or other written material from your doctor or pharmacy?: Patient unable to respond In the past 12 months, has lack of transportation kept you from medical appointments or from getting medications?: No In the past 12 months, has  lack of transportation kept you from meetings, work, or from getting things needed for daily living?: No Health Literacy and Transportation obtained via proxy: per partner, Catering Manager / Equipment Home Equipment: None  Prior Device Use: Indicate devices/aids used by the patient prior to current illness, exacerbation or injury? None of the above  Current Functional Level Cognition  Overall Cognitive Status: Difficult to assess Orientation Level: Oriented to person, Oriented to place, Oriented to situation, Disoriented to time    Extremity Assessment (includes Sensation/Coordination)  Upper Extremity Assessment: RUE deficits/detail RUE Deficits / Details: able to range pt through full ROM,however when asked to incorporate RUE into tasks pt reports he cant but pt initated with pt putting RUE on RW and pushing up from EOB with RUE RUE Sensation: decreased light touch RUE Coordination: decreased fine motor, decreased gross motor  Lower Extremity Assessment: Defer to PT evaluation    ADLs  Overall ADL's : Needs assistance/impaired Eating/Feeding: Moderate assistance, Sitting Grooming: Wash/dry hands, Contact guard assist, Cueing for sequencing Grooming Details (indicate cue type and reason): pt able to stand at sink with CGA but needed max cues to sequene task, pt did not wash RUE despite max mutlimodal cues. Upper Body Bathing: Sitting, Maximal assistance Lower Body Bathing: Maximal assistance, Sit to/from stand Lower Body Bathing Details (indicate cue type and reason): pt attempted posterior pericare after toileting but needed MAX A for cleanliness Upper Body Dressing : Minimal assistance, Sitting Upper Body Dressing Details (indicate cue type and reason): to don gown as back side cover Lower Body Dressing: Maximal assistance, Sit to/from stand Toilet Transfer: Minimal assistance, Ambulation, Regular Toilet, Grab bars, Rolling walker (2 wheels) Toilet Transfer  Details (indicate cue type and reason): MIN A for steadying assist and RW mgmt Toileting- Clothing Manipulation and Hygiene: Maximal assistance, Sit to/from stand Toileting - Clothing Manipulation Details (indicate cue type and reason): MAX A for cleanliness Functional mobility during ADLs: Minimal assistance, Rolling walker (2 wheels) General ADL Comments: ADL participation impacted by R sided inattention, apraxia, impaired motor planning, impaired baseline vision    Mobility  Overal bed mobility: Needs Assistance Bed Mobility: Supine to Sit, Sit to Supine Supine to sit: Contact guard, +2 for safety/equipment Sit to supine: Contact guard assist, +2 for safety/equipment General bed mobility comments: CGA for safety    Transfers  Overall transfer level: Needs assistance Equipment used: Rolling walker (2 wheels) Transfers: Sit to/from Stand Sit to Stand: Contact guard assist, +2 safety/equipment General transfer comment: CGA to rise from EOB, +2 for safety    Ambulation / Gait / Stairs / Wheelchair Mobility  Ambulation/Gait Ambulation/Gait assistance: Contact guard assist, Min assist, +2 safety/equipment Gait Distance (Feet): 150 Feet Assistive device: Rolling walker (2 wheels) Gait Pattern/deviations: Step-through pattern, Decreased step length - right, Decreased step length - left, Decreased stride length, Drifts right/left, Trunk flexed General Gait Details: trialed RW today for gait with pt noted to have extreme forward posture and RW to far out in front of him. Constant cues for upright posture and proximity to RW however pt stated through in person interpreter that he couldnt do it. CGA/Min A overall. Gait velocity: decr Gait velocity interpretation: <1.31 ft/sec, indicative of household ambulator    Posture / Balance Balance Overall balance assessment: Needs assistance Sitting-balance support: No upper extremity supported, Feet unsupported Sitting balance-Leahy Scale:  Fair Standing balance support: No upper extremity supported, During functional activity Standing balance-Leahy Scale: Fair Standing balance comment: CGA for safety    Special considerations/life events  [facility] MWF Required spanish interpreter   Previous Home Environment  Living Arrangements: Spouse/significant other (and 3 children, youngest 81 yo)  Lives With: Significant other, Family Available Help at Discharge:  (s/o works 6 pm until 9 pm, chidlren provide during those times) Type of Home: House Home Layout: One level Home Access: Stairs to enter Entrance Stairs-Rails: None Secretary/administrator of Steps: 2-3 Bathroom Shower/Tub: Associate Professor: Yes How Accessible: Accessible via walker Home Care Services: No  Discharge Living Setting Plans for Discharge Living Setting: Patient's home, House, Lives with (comment) (s/o and 3 children , 78 yo is the youngest) Type of Home at Discharge: House Discharge Home Layout: One level Discharge Home Access: Stairs to enter Entrance Stairs-Rails: None Entrance Stairs-Number of Steps: 2-3 Discharge Bathroom Shower/Tub: Tub/shower unit Discharge Bathroom Toilet:  Standard Discharge Bathroom Accessibility: Yes How Accessible: Accessible via walker Does the patient have any problems obtaining your medications?: No  Patient and partner had court date 08/06/24 for foreclosure proceedings on their home. Corlis has extended that date due to patient's admit. Partner states that a neighbor has agreed to allow she and patient to stay in a room at their home if needed, adult children would go stay with friends and family. She is hopeful that proceedings will be extended to March, so they can sell the house and get an apartment.  Social/Family/Support Systems Patient Roles: Partner, Parent Contact Information: significant other, Gladys. non english speaker; Anticipated Caregiver: wife  and 3 children Anticipated Caregiver's Contact Information: see contacts Ability/Limitations of Caregiver: wife works 6 pm until 9 pm daily; adult children can be with him at that time Caregiver Availability: 24/7 Discharge Plan Discussed with Primary Caregiver: Yes Is Caregiver In Agreement with Plan?: Yes Does Caregiver/Family have Issues with Lodging/Transportation while Pt is in Rehab?: No  Goals Patient/Family Goal for Rehab: supervision to min assist with PT, OT and SLP Expected length of stay: 7-10 days Cultural Considerations: Hispanic, needs interpreter Additional Information: Kenneth is his significant other, not married. Unable to get Medicaid for not a resident Pt/Family Agrees to Admission and willing to participate: Yes Program Orientation Provided & Reviewed with Pt/Caregiver Including Roles  & Responsibilities: Yes Additional Information Needs: in 2023 he was in an accident fell from a ladder 20 feet and was NWB to 3 extremitees. Family took him home, used product/process development scientist, catering manager and provided 24/7 care and to OP dialysis  Decrease burden of Care through IP rehab admission: n/a  Possible need for SNF placement upon discharge: not anticipated; no means for NF as self pay  Patient Condition: I have reviewed medical records from [facility], spoken with CM, and patient and spouse. I met with patient at the bedside for inpatient rehabilitation assessment.  Patient will benefit from ongoing PT, OT, and SLP, can actively participate in 3 hours of therapy a day 5 days of the week, and can make measurable gains during the admission.  Patient will also benefit from the coordinated team approach during an Inpatient Acute Rehabilitation admission.  The patient will receive intensive therapy as well as Rehabilitation physician, nursing, social worker, and care management interventions.  Due to bladder management, bowel management, safety, skin/wound care, disease management, medication  administration, pain management, and patient education the patient requires 24 hour a day rehabilitation nursing.  The patient is currently CGA-Min A with mobility and Min A-Max A with basic ADLs.  Discharge setting and therapy post discharge at home with outpatient is anticipated.  Patient has agreed to participate in the Acute Inpatient Rehabilitation Program and will admit today.  Preadmission Screen Completed By:  Alison Heron Lot, RN MSN with updates from Tinnie Yvone Cohens 08/14/2024 11:09 AM ______________________________________________________________________   Discussed status with Dr. Carilyn on 08/14/2024 at 11:09 AM and received approval for admission today.  Admission Coordinator:  Heron Lot Alison, RN MSN time 11:09 AM/Date 08/14/2024    Assessment/Plan: Diagnosis: Left MCA infarct Does the need for close, 24 hr/day Medical supervision in concert with the patient's rehab needs make it unreasonable for this patient to be served in a less intensive setting? Yes Co-Morbidities requiring supervision/potential complications: Aphasia, right hemiparesis, diabetes 2 uncontrolled, HTN uncontrolled, ESRD on HD Due to bladder management, bowel management, safety, skin/wound care, disease management, medication administration, pain management, and patient education, does the patient require  24 hr/day rehab nursing? Yes Does the patient require coordinated care of a physician, rehab nurse, PT, OT, and SLP to address physical and functional deficits in the context of the above medical diagnosis(es)? Yes Addressing deficits in the following areas: balance, endurance, locomotion, strength, transferring, bowel/bladder control, bathing, dressing, feeding, grooming, toileting, cognition, speech, language, swallowing, and psychosocial support Can the patient actively participate in an intensive therapy program of at least 3 hrs of therapy 5 days a week? Yes The potential for patient to make  measurable gains while on inpatient rehab is good Anticipated functional outcomes upon discharge from inpatient rehab: supervision PT, min assist OT, modified independent and supervision SLP Estimated rehab length of stay to reach the above functional goals is: 7-10d Anticipated discharge destination: Home 10. Overall Rehab/Functional Prognosis: good   MD Signature: Prentice CHARLENA Compton M.D. [facility] Fellow Am Acad of Phys Med and Rehab Diplomate Am Board of Electrodiagnostic Med Fellow Am Board of Interventional Pain      [1]  Current Facility-Administered Medications:    acetaminophen  (TYLENOL ) tablet 650 mg, 650 mg, Oral, Q6H PRN, 650 mg at 08/12/24 0932 **OR** acetaminophen  (TYLENOL ) suppository 650 mg, 650 mg, Rectal, Q6H PRN, Alfornia Madison, MD   alteplase  (CATHFLO ACTIVASE ) injection 2 mg, 2 mg, Intracatheter, Once PRN, Geralynn Charleston, MD   amLODipine  (NORVASC ) tablet 10 mg, 10 mg, Oral, Daily, Sigdel, Santosh, MD, 10 mg at 08/13/24 9077   ampicillin -sulbactam (UNASYN ) 1.5 g in sodium chloride  0.9 % 100 mL IVPB, 1.5 g, Intravenous, Q12H, Maranda Lonni MATSU, MD, Last Rate: 200 mL/hr at 08/13/24 2214, 1.5 g at 08/13/24 2214   anticoagulant sodium citrate  solution 5 mL, 5 mL, Intracatheter, PRN, Geralynn Charleston, MD   aspirin  EC tablet 81 mg, 81 mg, Oral, Daily, Jerri Pfeiffer, MD, 81 mg at 08/13/24 9077   carvedilol  (COREG ) tablet 12.5 mg, 12.5 mg, Oral, BID WC, Imam, Shehab F, DO, 12.5 mg at 08/13/24 1709   Chlorhexidine  Gluconate Cloth 2 % PADS 6 each, 6 each, Topical, Q0600, Geralynn Charleston, MD, 6 each at 08/14/24 9378   Chlorhexidine  Gluconate Cloth 2 % PADS 6 each, 6 each, Topical, Q0600, Geralynn Charleston, MD, 6 each at 08/14/24 9378   clopidogrel  (PLAVIX ) tablet 75 mg, 75 mg, Oral, Daily, Sigdel, Santosh, MD, 75 mg at 08/13/24 9077   ezetimibe  (ZETIA ) tablet 10 mg, 10 mg, Oral, Daily, Jerri Pfeiffer, MD, 10 mg at 08/13/24 0922   [START ON 08/15/2024] heparin   injection 1,000 Units, 1,000 Units, Dialysis, PRN, Geralynn Charleston, MD   heparin  injection 1,000 Units, 1,000 Units, Intracatheter, PRN, Geralynn Charleston, MD   heparin  injection 5,000 Units, 5,000 Units, Subcutaneous, Q8H, Rathore, Vasundhra, MD, 5,000 Units at 08/14/24 9378   insulin  aspart (novoLOG ) injection 0-5 Units, 0-5 Units, Subcutaneous, QHS, Rathore, Madison, MD   insulin  aspart (novoLOG ) injection 0-6 Units, 0-6 Units, Subcutaneous, TID WC, Rathore, Vasundhra, MD, 1 Units at 08/13/24 1715   lidocaine  (PF) (XYLOCAINE ) 1 % injection 5 mL, 5 mL, Intradermal, PRN, Geralynn Charleston, MD   lidocaine -prilocaine  (EMLA ) cream 1 Application, 1 Application, Topical, PRN, Geralynn Charleston, MD   pentafluoroprop-tetrafluoroeth (GEBAUERS) aerosol 1 Application, 1 Application, Topical, PRN, Geralynn Charleston, MD   rosuvastatin  (CRESTOR ) tablet 10 mg, 10 mg, Oral, Daily, Jerri Pfeiffer, MD, 10 mg at 08/13/24 [phone]

## 2024-08-10 NOTE — Plan of Care (Signed)
   Problem: Education: Goal: Ability to describe self-care measures that may prevent or decrease complications (Diabetes Survival Skills Education) will improve Outcome: Progressing Goal: Individualized Educational Video(s) Outcome: Progressing   Problem: Coping: Goal: Ability to adjust to condition or change in health will improve Outcome: Progressing

## 2024-08-10 NOTE — Plan of Care (Signed)
 He told family (brother) that he can't feel his lower lip and has numbness on L face. Denies teeth hurting.    Problem: Education: Goal: Ability to describe self-care measures that may prevent or decrease complications (Diabetes Survival Skills Education) will improve Outcome: Progressing   Problem: Nutritional: Goal: Maintenance of adequate nutrition will improve Outcome: Progressing   Problem: Activity: Goal: Risk for activity intolerance will decrease Outcome: Progressing   Problem: Pain Managment: Goal: General experience of comfort will improve and/or be controlled Outcome: Progressing   Problem: Safety: Goal: Ability to remain free from injury will improve Outcome: Progressing   Problem: Skin Integrity: Goal: Risk for impaired skin integrity will decrease Outcome: Progressing

## 2024-08-10 NOTE — Progress Notes (Signed)
 [facility] Progress Note  Subjective:  Seen in room Spoke w/ interpreter  Presentation summary: 54 y.o. year-old w/ PMH as below who presented to ED today complaining of trouble speaking and choking when he was eating dinner last night.  Then around noon he began having slurred speech and numbness on the left side of his face.  No extremity involvement. In ED BP 231/ 94, HR 72, RR 18, 100% on RA. Labs showed Na 134, K 6.4, bun 50, creat 10. MRI showed acute left posterior MCA territory infarct involving the left anterior temporal lobe, overlying parietal lobe and posterior left insula.  Some edema without mass effect.  We were asked to see for dialysis.   Vitals:   08/09/24 2330 08/10/24 0325 08/10/24 0737 08/10/24 1127  BP: (!) 169/87 (!) 165/72 (!) 156/67 (!) 144/70  Pulse: 81 75 70 76  Resp: 17 18 16 16   Temp: 98.3 F (36.8 C) 98 F (36.7 C) 99.4 F (37.4 C) 98.1 F (36.7 C)  TempSrc: Oral  Oral Oral  SpO2: 99% 98% 96% 91%  Weight:      Height:        Exam: Gen alert, no distress Sclera anicteric, throat clear  No jvd or bruits Chest clear bilat to bases RRR no MRG Abd soft ntnd no mass or ascites +bs Ext no LE or UE edema, no other edema Neuro is alert, nf    LUA AVF+bruit    Home bp meds: Norvasc  Coreg     [facility] HD: [facility] 3h B350 65.2kg  AVF  Hep 2000  Last OP HD 1/02, post wt 67.2kg Hep B SAg neg 07/10/24 Heb B SAb +Immune 05/15/24     Assessment/ Plan:   # ESRD - on HD MWF - next HD Friday  # Hyperkalemia - resolved   # acute CVA  - L posterior MCA territory per MRI - aphasia - had CVA extension  - PT seeing, CIR evaluating   # HTN - on norvasc  and coreg  - SBPs 150 /75 range   - per neurology, try to keep SBP 140- 180   # Volume - no vol excess on exam, on RA - up 1-2 kg by wts - initial CXR showed vasc congestion - 2 L off w/ HD 1/05 and 0.7 L w/ HD 1/07 - UF 1 L w/ HD tomorrow, try to keep SBP 140- 180     #  Anemia of esrd - Hb 12- 13 here, follow, no esa needs    Myer Fret MD  [facility] 08/10/2024, 3:10 PM  Recent Labs  Lab 08/06/24 1727 08/06/24 1746 08/08/24 0156 08/08/24 1234 08/09/24 0152 08/10/24 0215  HGB 12.1*   < > 11.5*  --   --  11.0*  ALBUMIN 4.1  --  3.8  --   --   --   CALCIUM  8.2*   < > 7.8*   < > 7.4* 7.7*  PHOS  --   --  5.9*  --   --   --   CREATININE 10.60*   < > 7.95*   < > 10.20* 6.48*  K 6.4*   < > 5.7*   < > 4.9 3.9   < > = values in this interval not displayed.   No results for input(s): IRON , TIBC, FERRITIN in the last 168 hours. Inpatient medications:  amLODipine   10 mg Oral Daily   aspirin  EC  81 mg Oral Daily   Chlorhexidine  Gluconate Cloth  6 each Topical Q0600   Chlorhexidine  Gluconate Cloth  6 each Topical Q0600   clopidogrel   75 mg Oral Daily   [START ON 08/11/2024] ezetimibe   10 mg Oral Daily   heparin   5,000 Units Subcutaneous Q8H   insulin  aspart  0-5 Units Subcutaneous QHS   insulin  aspart  0-6 Units Subcutaneous TID WC   rosuvastatin   10 mg Oral Daily     acetaminophen  **OR** acetaminophen , hydrALAZINE 

## 2024-08-11 ENCOUNTER — Inpatient Hospital Stay (HOSPITAL_COMMUNITY): Payer: MEDICAID

## 2024-08-11 DIAGNOSIS — I63512 Cerebral infarction due to unspecified occlusion or stenosis of left middle cerebral artery: Secondary | ICD-10-CM

## 2024-08-11 LAB — GLUCOSE, CAPILLARY
Glucose-Capillary: 96 mg/dL (ref 70–99)
Glucose-Capillary: 182 mg/dL — ABNORMAL HIGH (ref 70–99)
Glucose-Capillary: 90 mg/dL (ref 70–99)
Glucose-Capillary: 198 mg/dL — ABNORMAL HIGH (ref 70–99)

## 2024-08-11 MED ORDER — HEPARIN SODIUM (PORCINE) 1000 UNIT/ML DIALYSIS: 1000.0000 [IU] | INTRAMUSCULAR | Status: DC | PRN

## 2024-08-11 MED ORDER — HEPARIN SODIUM (PORCINE) 1000 UNIT/ML DIALYSIS
2000.0000 [IU] | Freq: Once | INTRAMUSCULAR | Status: AC
  Administered 2024-08-11: 2000 [IU] via INTRAVENOUS_CENTRAL

## 2024-08-11 MED ORDER — SODIUM CHLORIDE 0.9 % IV SOLN
1.5000 g | Freq: Two times a day (BID) | INTRAVENOUS | Status: DC
  Administered 2024-08-11 – 2024-08-14 (×6): 1.5 g via INTRAVENOUS
  Filled 2024-08-11 (×8): qty 4

## 2024-08-11 MED ORDER — PENTAFLUOROPROP-TETRAFLUOROETH EX AERO: 1.0000 | INHALATION_SPRAY | CUTANEOUS | Status: DC | PRN

## 2024-08-11 MED ORDER — SODIUM CHLORIDE 0.9 % IV SOLN: 1.5000 g | Freq: Four times a day (QID) | INTRAVENOUS | Status: DC

## 2024-08-11 MED ORDER — HEPARIN SODIUM (PORCINE) 1000 UNIT/ML IJ SOLN
INTRAMUSCULAR | Status: AC
  Filled 2024-08-11: qty 3

## 2024-08-11 NOTE — Procedures (Signed)
 HD Note:  Some information was entered later than the data was gathered due to patient care needs. The stated time with the data is accurate.  Received patient in bed to unit.   Alert and oriented.   Informed consent signed and in chart.   Access used: Upper left arm graft Access issues: None  Patient tolerated treatment well.   TX duration: 3.25 hours  Alert, without acute distress.  Total UF removed: 1000 ml  Hand-off given to patient's nurse.   Transported back to the room   Kipper Buch L. Lenon, RN [facility].

## 2024-08-11 NOTE — Progress Notes (Signed)
" °   08/11/24 1600  Vitals  Pulse Rate 65  ECG Heart Rate 65  Resp 16  Oxygen Therapy  SpO2 96 %     Patient is off the unit in Hemodialysis "

## 2024-08-11 NOTE — Progress Notes (Signed)
 STROKE TEAM PROGRESS NOTE   SUBJECTIVE (INTERVAL HISTORY) Patient seen today in dialysis unit.  Still has aphasia, not following commands but able to mimic gestures.  Right upper extremity and lower extremity seems improving from yesterday.  Orofacial pain seems also improved, pending Panorex   OBJECTIVE Temp:  [97.7 F (36.5 C)-98.4 F (36.9 C)] 98 F (36.7 C) (01/09 1340) Pulse Rate:  [65-72] 69 (01/09 1723) Cardiac Rhythm: Normal sinus rhythm (01/09 1717) Resp:  [10-19] 19 (01/09 1723) BP: (153-186)/(29-86) 186/86 (01/09 1723) SpO2:  [93 %-97 %] 96 % (01/09 1723) Weight:  [67.6 kg] 67.6 kg (01/09 1340)  Recent Labs  Lab 08/10/24 1121 08/10/24 1633 08/10/24 2133 08/11/24 0613 08/11/24 1257  GLUCAP 129* 125* 96 96 182*   Recent Labs  Lab 08/07/24 0227 08/08/24 0156 08/08/24 1234 08/09/24 0152 08/10/24 0215  NA 136 133* 131* 132* 132*  K 5.7* 5.7* 5.4* 4.9 3.9  CL 95* 91* 90* 89* 91*  CO2 22 28 26 26 29   GLUCOSE 134* 147* 143* 153* 116*  BUN 58* 35* 39* 47* 22*  CREATININE 11.50* 7.95* 9.05* 10.20* 6.48*  CALCIUM  7.9* 7.8* 7.7* 7.4* 7.7*  PHOS  --  5.9*  --   --   --    Recent Labs  Lab 08/06/24 1727 08/08/24 0156  AST 13*  --   ALT 7  --   ALKPHOS 80  --   BILITOT 0.3  --   PROT 7.6  --   ALBUMIN 4.1 3.8   Recent Labs  Lab 08/06/24 1727 08/06/24 1746 08/08/24 0156 08/10/24 0215  WBC 8.8  --  8.9 7.5  NEUTROABS 5.9  --   --  4.5  HGB 12.1* 12.9* 11.5* 11.0*  HCT 37.3* 38.0* 34.8* 32.3*  MCV 97.9  --  94.6 93.6  PLT 162  --  152 139*   No results for input(s): CKTOTAL, CKMB, CKMBINDEX, TROPONINI in the last 168 hours. No results for input(s): LABPROT, INR in the last 72 hours.  No results for input(s): COLORURINE, LABSPEC, PHURINE, GLUCOSEU, HGBUR, BILIRUBINUR, KETONESUR, PROTEINUR, UROBILINOGEN, NITRITE, LEUKOCYTESUR in the last 72 hours.  Invalid input(s): APPERANCEUR     Component Value Date/Time   CHOL  189 08/07/2024 0227   CHOL 248 (H) 02/29/2024 1119   TRIG 169 (H) 08/07/2024 0227   HDL 33 (L) 08/07/2024 0227   HDL 30 (L) 02/29/2024 1119   CHOLHDL 5.7 08/07/2024 0227   VLDL 34 08/07/2024 0227   LDLCALC 122 (H) 08/07/2024 0227   LDLCALC 174 (H) 02/29/2024 1119   Lab Results  Component Value Date   HGBA1C 8.5 (H) 08/07/2024   No results found for: LABOPIA, COCAINSCRNUR, LABBENZ, AMPHETMU, THCU, LABBARB  Recent Labs  Lab 08/06/24 1727  ETH <15    I have personally reviewed the radiological images below and agree with the radiology interpretations.  VAS US  LOWER EXTREMITY VENOUS (DVT) Result Date: 08/09/2024  Lower Venous DVT Study Patient Name:  Blake Santana  Date of Exam:   08/09/2024 Medical Rec #: [id number]                 Accession #:    [id number] Date of Birth: [birth year]                 Patient Gender: M Patient Age:   56 years Exam Location:  [facility] Procedure:      VAS US  LOWER EXTREMITY VENOUS (DVT) Referring Phys: ARY Emmalene Kattner --------------------------------------------------------------------------------  Indications: Stroke.  Risk Factors: None identified. Comparison Study: No prior studies. Performing Technologist: Cordella Collet RVT  Examination Guidelines: A complete evaluation includes B-mode imaging, spectral Doppler, color Doppler, and power Doppler as needed of all accessible portions of each vessel. Bilateral testing is considered an integral part of a complete examination. Limited examinations for reoccurring indications may be performed as noted. The reflux portion of the exam is performed with the patient in reverse Trendelenburg.  +---------+---------------+---------+-----------+----------+--------------+ RIGHT    CompressibilityPhasicitySpontaneityPropertiesThrombus Aging +---------+---------------+---------+-----------+----------+--------------+ CFV      Full           Yes      Yes                                  +---------+---------------+---------+-----------+----------+--------------+ SFJ      Full                                                        +---------+---------------+---------+-----------+----------+--------------+ FV Prox  Full                                                        +---------+---------------+---------+-----------+----------+--------------+ FV Mid   Full                                                        +---------+---------------+---------+-----------+----------+--------------+ FV DistalFull                                                        +---------+---------------+---------+-----------+----------+--------------+ PFV      Full                                                        +---------+---------------+---------+-----------+----------+--------------+ POP      Full           Yes      Yes                                 +---------+---------------+---------+-----------+----------+--------------+ PTV      Full                                                        +---------+---------------+---------+-----------+----------+--------------+ PERO     Full                                                        +---------+---------------+---------+-----------+----------+--------------+   +---------+---------------+---------+-----------+----------+--------------+  LEFT     CompressibilityPhasicitySpontaneityPropertiesThrombus Aging +---------+---------------+---------+-----------+----------+--------------+ CFV      Full           Yes      Yes                                 +---------+---------------+---------+-----------+----------+--------------+ SFJ      Full                                                        +---------+---------------+---------+-----------+----------+--------------+ FV Prox  Full                                                         +---------+---------------+---------+-----------+----------+--------------+ FV Mid   Full                                                        +---------+---------------+---------+-----------+----------+--------------+ FV DistalFull           Yes      Yes                                 +---------+---------------+---------+-----------+----------+--------------+ PFV      Full                                                        +---------+---------------+---------+-----------+----------+--------------+ POP      Full           Yes      Yes                                 +---------+---------------+---------+-----------+----------+--------------+ PTV      Full                                                        +---------+---------------+---------+-----------+----------+--------------+ PERO     Full                                                        +---------+---------------+---------+-----------+----------+--------------+     Summary: RIGHT: - There is no evidence of deep vein thrombosis in the lower extremity.  - No cystic structure found in the popliteal fossa.  LEFT: - There is no evidence of deep vein thrombosis in the lower extremity.  - No cystic structure found in the popliteal  fossa.  *See table(s) above for measurements and observations. Electronically signed by Gaile New MD on 08/09/2024 at 7:08:42 PM.    Final    MR ANGIO HEAD WO CONTRAST Result Date: 08/09/2024 EXAM: MR Angiography Head without intravenous Contrast. 08/08/2024 09:52:09 PM TECHNIQUE: Magnetic resonance angiography images of the head without intravenous contrast. Multiplanar 2D and 3D reformatted images are provided for review. COMPARISON: MR Head Without IV Contrast 08/06/2024. CLINICAL HISTORY: Stroke, follow up. FINDINGS: Mild to moderately motion limited study. ANTERIOR CIRCULATION: No significant stenosis of the internal carotid arteries. No significant stenosis of the anterior  cerebral arteries. Hypoplastic right A1 ACA. Similar occluded proximal left M2 MCA. No aneurysm. POSTERIOR CIRCULATION: Severe right and moderate left P2 PCA stenosis. No significant stenosis of the basilar artery. No significant stenosis of the vertebral arteries. No aneurysm. IMPRESSION: 1. Similar occluded proximal left M2 MCA. 2. Severe right and moderate left P2 PCA stenosis. Electronically signed by: Gilmore Molt 08/09/2024 03:35 AM EST RP Workstation: [id number]   MR BRAIN WO CONTRAST Result Date: 08/09/2024 EXAM: MRI BRAIN WITHOUT CONTRAST 08/08/2024 09:38:18 PM TECHNIQUE: Multiplanar multisequence MRI of the head/brain was performed without the administration of intravenous contrast. COMPARISON: MRI head 08/06/2024 CLINICAL HISTORY: Stroke, follow up. FINDINGS: BRAIN AND VENTRICLES: Increasingly confluent and expanded left posterior MCA territory infarct. Increased edema with slight (2 mm) rightward midline shift. Numerous punctate foci of susceptibility artifact throughout the brain. No intracranial hemorrhage. No mass. No hydrocephalus. The sella is unremarkable. ORBITS: No acute abnormality. SINUSES AND MASTOIDS: No acute abnormality. BONES AND SOFT TISSUES: Normal marrow signal. No acute soft tissue abnormality. IMPRESSION: 1. In comparison to 08/06/2024 MRI , increasingly confluent and expanded left posterior MCA territory infarct. Increased edema with slight (2 mm) rightward midline shift. 2. Numerous punctate foci of susceptibility artifact throughout the brain, possibly prior micro emboli. Electronically signed by: Gilmore Molt 08/09/2024 03:27 AM EST RP Workstation: [id number]   DG Shoulder Right Port Result Date: 08/08/2024 CLINICAL DATA:  Right shoulder pain. EXAM: RIGHT SHOULDER - 1 VIEW COMPARISON:  None Available. FINDINGS: There is no evidence of acute fracture or dislocation. A small chronic appearing deformity is seen along the dorsal aspect of the distal right clavicle. Soft  tissues are unremarkable. Mild to moderate severity atelectasis and/or infiltrate is noted within the visualized portion of the right lung base. IMPRESSION: 1. No acute osseous abnormality. Electronically Signed   By: Suzen Dials M.D.   On: 08/08/2024 20:20   ECHOCARDIOGRAM COMPLETE Result Date: 08/07/2024    ECHOCARDIOGRAM REPORT   Patient Name:   Blake Santana Date of Exam: 08/07/2024 Medical Rec #:  [id number]                Height:       62.0 in Accession #:    [id number]               Weight:       146.0 lb Date of Birth:  [birth year]                BSA:          1.672 m Patient Age:    53 years                 BP:           205/100 mmHg Patient Gender: M                        HR:  93 bpm. Exam Location:  Inpatient Procedure: 2D Echo, Cardiac Doppler and Color Doppler (Both Spectral and Color            Flow Doppler were utilized during procedure). Indications:    Stroke I63.9  History:        Patient has prior history of Echocardiogram examinations, most                 recent 04/03/2017. Risk Factors:Hypertension.  Sonographer:    Nathanel Devonshire Referring Phys: EDITHA RAM IMPRESSIONS  1. Left ventricular ejection fraction, by estimation, is 60 to 65%. The left ventricle has normal function. The left ventricle has no regional wall motion abnormalities. There is mild left ventricular hypertrophy. Left ventricular diastolic parameters are indeterminate.  2. Right ventricular systolic function is normal. The right ventricular size is normal.  3. The mitral valve is normal in structure. Trivial mitral valve regurgitation. No evidence of mitral stenosis.  4. The aortic valve is normal in structure. Aortic valve regurgitation is not visualized. No aortic stenosis is present.  5. The inferior vena cava is normal in size with greater than 50% respiratory variability, suggesting right atrial pressure of 3 mmHg. Conclusion(s)/Recommendation(s): No intracardiac source of embolism detected on  this transthoracic study. Consider a transesophageal echocardiogram to exclude cardiac source of embolism if clinically indicated. FINDINGS  Left Ventricle: Left ventricular ejection fraction, by estimation, is 60 to 65%. The left ventricle has normal function. The left ventricle has no regional wall motion abnormalities. The left ventricular internal cavity size was normal in size. There is  mild left ventricular hypertrophy. Abnormal (paradoxical) septal motion, consistent with left bundle branch block. Left ventricular diastolic parameters are indeterminate. Right Ventricle: The right ventricular size is normal. No increase in right ventricular wall thickness. Right ventricular systolic function is normal. Left Atrium: Left atrial size was normal in size. Right Atrium: Right atrial size was normal in size. Pericardium: There is no evidence of pericardial effusion. Mitral Valve: The mitral valve is normal in structure. Trivial mitral valve regurgitation. No evidence of mitral valve stenosis. Tricuspid Valve: The tricuspid valve is normal in structure. Tricuspid valve regurgitation is not demonstrated. No evidence of tricuspid stenosis. Aortic Valve: The aortic valve is normal in structure. Aortic valve regurgitation is not visualized. No aortic stenosis is present. Aortic valve mean gradient measures 5.0 mmHg. Aortic valve peak gradient measures 10.0 mmHg. Aortic valve area, by VTI measures 1.60 cm. Pulmonic Valve: The pulmonic valve was normal in structure. Pulmonic valve regurgitation is trivial. No evidence of pulmonic stenosis. Aorta: The aortic root is normal in size and structure. Venous: The inferior vena cava is normal in size with greater than 50% respiratory variability, suggesting right atrial pressure of 3 mmHg. IAS/Shunts: No atrial level shunt detected by color flow Doppler.  LEFT VENTRICLE PLAX 2D LVIDd:         4.50 cm LVIDs:         3.00 cm LV PW:         1.30 cm LV IVS:        1.30 cm LVOT diam:      1.80 cm LV SV:         47 LV SV Index:   28 LVOT Area:     2.54 cm  LV Volumes (MOD) LV vol d, MOD A2C: 71.3 ml LV vol d, MOD A4C: 83.2 ml LV vol s, MOD A2C: 27.2 ml LV vol s, MOD A4C: 32.5 ml LV SV MOD A2C:  44.1 ml LV SV MOD A4C:     83.2 ml LV SV MOD BP:      49.1 ml RIGHT VENTRICLE RV Basal diam:  2.80 cm RV S prime:     15.00 cm/s TAPSE (M-mode): 1.9 cm LEFT ATRIUM             Index        RIGHT ATRIUM           Index LA diam:        3.10 cm 1.85 cm/m   RA Area:     12.20 cm LA Vol (A2C):   40.9 ml 24.46 ml/m  RA Volume:   25.70 ml  15.37 ml/m LA Vol (A4C):   52.9 ml 31.63 ml/m LA Biplane Vol: 47.5 ml 28.40 ml/m  AORTIC VALVE                    PULMONIC VALVE AV Area (Vmax):    1.69 cm     PV Vmax:       1.17 m/s AV Area (Vmean):   1.47 cm     PV Peak grad:  5.5 mmHg AV Area (VTI):     1.60 cm AV Vmax:           158.00 cm/s AV Vmean:          99.300 cm/s AV VTI:            0.293 m AV Peak Grad:      10.0 mmHg AV Mean Grad:      5.0 mmHg LVOT Vmax:         105.00 cm/s LVOT Vmean:        57.200 cm/s LVOT VTI:          0.184 m LVOT/AV VTI ratio: 0.63  AORTA Ao Root diam: 2.70 cm Ao Asc diam:  2.90 cm  SHUNTS Systemic VTI:  0.18 m Systemic Diam: 1.80 cm Oneil Parchment MD Electronically signed by Oneil Parchment MD Signature Date/Time: 08/07/2024/4:34:40 PM    Final    CT VENOGRAM HEAD Result Date: 08/07/2024 EXAM: CT VENOGRAM WITH CONTRAST 08/07/2024 03:42:47 AM TECHNIQUE: CT venogram of the head/brain was performed with the administration of intravenous contrast. Multiplanar reformatted images are provided for review. MIP images are provided for review. Automated exposure control, iterative reconstruction, and/or weight based adjustment of the mA/kV was utilized to reduce the radiation dose to as low as reasonably achievable. COMPARISON: None available. CLINICAL HISTORY: Dural venous sinus thrombosis suspected FINDINGS: No dural venous sinus thrombosis. No significant stenosis. IMPRESSION: 1. No dural  venous sinus thrombosis. Electronically signed by: Gilmore Molt 08/07/2024 04:03 AM EST RP Workstation: [id number]   CT ANGIO HEAD NECK W WO CM Result Date: 08/07/2024 EXAM: CTA HEAD AND NECK WITH AND WITHOUT 08/07/2024 03:42:47 AM TECHNIQUE: CTA of the head and neck was performed with and without the administration of intravenous contrast. Multiplanar 2D and/or 3D reformatted images are provided for review. Automated exposure control, iterative reconstruction, and/or weight based adjustment of the mA/kV was utilized to reduce the radiation dose to as low as reasonably achievable. Stenosis of the internal carotid arteries measured using NASCET criteria. COMPARISON: None available CLINICAL HISTORY: Neuro deficit, acute, stroke suspected Neuro deficit, acute, stroke suspected FINDINGS: AORTIC ARCH AND ARCH VESSELS: No dissection or arterial injury. No significant stenosis of the brachiocephalic or subclavian arteries. CERVICAL CAROTID ARTERIES: No dissection, arterial injury, or hemodynamically significant stenosis by NASCET criteria. CERVICAL VERTEBRAL ARTERIES: No dissection, arterial  injury, or significant stenosis. LUNGS AND MEDIASTINUM: Unremarkable. SOFT TISSUES: No acute abnormality. BONES: No acute abnormality. ANTERIOR CIRCULATION: No significant stenosis of the internal carotid arteries. No significant stenosis of the anterior cerebral arteries. Occluded proximal left M2 MCA branch. No aneurysm. POSTERIOR CIRCULATION: PCAs are patent. Severe bilateral P2 PCA stenosis. No significant stenosis of the basilar artery. Vertebral arteries are patent. Moderate right intradural vertebral artery stenosis. No aneurysm. OTHER: No dural venous sinus thrombosis on this non-dedicated study. Findings discussed with Dr. Khaliqdina via telephone at 3:55 AM. IMPRESSION: 1. Occluded proximal left M2 MCA branch. 2. Severe bilateral P2 PCA stenosis. 3. Moderate right intradural vertebral artery stenosis. Electronically  signed by: Gilmore Molt 08/07/2024 04:00 AM EST RP Workstation: [id number]   DG CHEST PORT 1 VIEW Result Date: 08/07/2024 EXAM: 1 VIEW(S) XRAY OF THE CHEST 08/07/2024 02:24:13 AM COMPARISON: 11/17/2021 CLINICAL HISTORY: Hypoxia [id number] Hypoxia FINDINGS: LUNGS AND PLEURA: Low lung volumes. No focal pulmonary opacity. No pleural effusion. No pneumothorax. HEART AND MEDIASTINUM: Cardiomegaly with vascular congestion. BONES AND SOFT TISSUES: No acute osseous abnormality. IMPRESSION: 1. Cardiomegaly with vascular congestion. 2. Low lung volumes. Electronically signed by: Franky Crease MD 08/07/2024 02:26 AM EST RP Workstation: [id number]   MR BRAIN WO CONTRAST Result Date: 08/06/2024 EXAM: MRI BRAIN WITHOUT CONTRAST 08/06/2024 09:36:57 PM TECHNIQUE: Multiplanar multisequence MRI of the head/brain was performed without the administration of intravenous contrast. COMPARISON: CT head earlier today. CLINICAL HISTORY: Neuro deficit, acute, stroke suspected FINDINGS: BRAIN AND VENTRICLES: Acute left posterior MCA territory infarct involving the left anterior temporal lobe, overlying parietal lobe and posterior left insula. Edema without mass effect. Numerous punctate foci of susceptibility artifact throughout the brain. No intracranial hemorrhage. No mass. No midline shift. No hydrocephalus. The sella is unremarkable. Normal flow voids. ORBITS: No acute abnormality. SINUSES AND MASTOIDS: No acute abnormality. BONES AND SOFT TISSUES: Normal marrow signal. No acute soft tissue abnormality. IMPRESSION: 1. Acute left posterior MCA territory infarct. 2. Numerous punctate foci of susceptibility artifact throughout the brain, possibly prior micro emboli. Electronically signed by: Gilmore Molt 08/06/2024 09:54 PM EST RP Workstation: [id number]   CT HEAD WO CONTRAST Result Date: 08/06/2024 CLINICAL DATA:  Altered aphasia EXAM: CT HEAD WITHOUT CONTRAST TECHNIQUE: Contiguous axial images were obtained from the base of the skull  through the vertex without intravenous contrast. RADIATION DOSE REDUCTION: This exam was performed according to the departmental dose-optimization program which includes automated exposure control, adjustment of the mA and/or kV according to patient size and/or use of iterative reconstruction technique. COMPARISON:  CT brain 11/17/2021 FINDINGS: Brain: Focal cortical and subcortical hypodensity at the left temporoparietal lobe. No hemorrhage. No mass. No significant mass effect or midline shift. The ventricles are nonenlarged Vascular: No hyperdense vessels. Carotid and vertebral calcification. Extensive small vessel calcification. Skull: No fracture Sinuses/Orbits: No acute finding. Other: None IMPRESSION: Focal cortical and subcortical hypodensity at the left temporoparietal lobe, suspicious for acute to subacute infarct. No hemorrhage. No significant mass effect or midline shift. Electronically Signed   By: Luke Bun M.D.   On: 08/06/2024 18:09     PHYSICAL EXAM  Temp:  [97.7 F (36.5 C)-98.4 F (36.9 C)] 98 F (36.7 C) (01/09 1340) Pulse Rate:  [65-72] 69 (01/09 1723) Resp:  [10-19] 19 (01/09 1723) BP: (153-186)/(29-86) 186/86 (01/09 1723) SpO2:  [93 %-97 %] 96 % (01/09 1723) Weight:  [67.6 kg] 67.6 kg (01/09 1340)  General - Well nourished, well developed, in mild acute distress from orofacial pain  Ophthalmologic - fundi  not visualized due to noncooperation.  Cardiovascular - Regular rhythm and rate.  Neuro - Pt is awake, alert, eyes open, global aphasia, only mumbles without meaningful words. Not following commands, but seems able to mimic gestures. No gaze palsy, able to track bilaterally but incomplete on both directions. Not consistently blinking to visual threat bilaterally but seems blinking more on the left and less on the right, PERRL. R mild facial droop. Tongue protrusion not cooperative. RUE 3/5. RLE 4+/5. LUE and LLE 5/5. Sensation, coordination not cooperative and gait  not tested.    ASSESSMENT/PLAN Blake Santana is a 54 y.o. male with history of ESRD on HD, hypertension, hyperlipidemia, diabetes with retinopathy, PAD admitted for global aphasia. No TNK given due to outside window.    Stroke with extension:  left MCA moderately large infarct, likely secondary to large vessel disease, can not rule out cardioembolic source CT left temporoparietal acute infarct CTA head and neck left M2 occlusion, severe bilateral P2 stenosis, right V4 moderate stenosis CTV negative MRI Acute left posterior MCA territory infarct. Numerous punctate foci of susceptibility artifact throughout the brain, possibly prior micro emboli. Limited MRI repeat showed left MCA infarct extended to moderately large size of stroke MRA Similar occluded proximal left M2 MCA. Severe right and moderate left P2 PCA stenosis. 2D Echo EF 60 to 65% LE venous Doppler no DVT Will consider 30 day monitoring as outpt LDL 122 HgbA1c 8.5 Heparin  subcu for VTE prophylaxis No antithrombotic prior to admission, now on aspirin  81 mg daily and clopidogrel  75 mg daily DAPT for 3 months and then aspirin  alone given M2 occlusion Ongoing aggressive stroke risk factor management Therapy recommendations: CIR Disposition: Pending  Diabetes HgbA1c 8.5, goal < 7.0 Uncontrolled CBG monitoring SSI DM education and close PCP follow up  Hypertension Unstable on the high end Clinically improving, recommend gradually lowering BP to goal within 5 to 7 days Long-term BP goal 130-150 given left M2 occlusion  Hyperlipidemia Home meds: Crestor  10 LDL 122, goal < 70 Now on Crestor  10 which is the highest dose for ESRD patient Add zetia  10 Continue statin and zetia  at discharge  Orofacial pain grimace on chewing, swallowing.  Hard to locate the lesion due to aphasia. No obvious lesion in oral cavity. Concerning for toothache instead of oral or laryngeal issue.  Primary team aware, defer to  primary for management Panorex pending  Other Stroke Risk Factors PAD  Other Active Problems ESRD on dialysis R shoulder pain - X-ray neg  Hospital day # 5   Ary Cummins, MD PhD Stroke Neurology 08/11/2024 5:39 PM    To contact Stroke Continuity provider, please refer to [email or web address]. After hours, contact General Neurology

## 2024-08-11 NOTE — Progress Notes (Signed)
 Occupational Therapy Treatment Patient Details Name: Blake Santana MRN: [id number] DOB: [birth year] Today's Date: 08/11/2024   History of present illness Pt is 54 y.o. male presented to [facility] on 08/07/23 for aphasia. MRI showed acute lt posterior MCA infarct. PMH - ESRD on HD, DM, Diabetic retinopahty, HTN.   OT comments  Pt seen in conjunction with PT to maximize pts activity tolerance and optimize pt participation. Pt continues to present with impaired balance, impaired motor planning/initiation in RUE, and aphasia impacting pts independence with ADLs. Pt currently requires CGA for ambulatory ADL transfers with RW, +2 for safety, pt required MAX  multimodal cues for RW mgmt as pt tends to lean over RW. Pt presents with R inattention with  pt noted to neglect his R hand during hand hygiene task but did initiate putting RUE on RW. Attempted to further assess vision with visual scanning task however pt reports I can't but doesn't elaborate on why, pt was able to touch stimulus on mirror but not in correct sequence. Pt with frequent loose BMs needing MAX A for pericare.  Patient will benefit from intensive inpatient follow-up therapy, >3 hours/day        If plan is discharge home, recommend the following:  A lot of help with walking and/or transfers;A lot of help with bathing/dressing/bathroom;Assistance with cooking/housework;Assist for transportation;Help with stairs or ramp for entrance;Direct supervision/assist for financial management;Direct supervision/assist for medications management;Assistance with feeding   Equipment Recommendations  Other (comment) (defer)    Recommendations for Other Services      Precautions / Restrictions Precautions Precautions: Fall;Other (comment) Recall of Precautions/Restrictions: Impaired Precaution/Restrictions Comments: aphasic, 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 Restrictions Weight Bearing Restrictions Per Provider Order: No       Mobility Bed  Mobility Overal bed mobility: Needs Assistance Bed Mobility: Supine to Sit, Sit to Supine     Supine to sit: Contact guard, +2 for safety/equipment Sit to supine: Contact guard assist, +2 for safety/equipment   General bed mobility comments: CGA for safety    Transfers Overall transfer level: Needs assistance Equipment used: Rolling walker (2 wheels) Transfers: Sit to/from Stand Sit to Stand: Contact guard assist, +2 safety/equipment           General transfer comment: CGA to rise from EOB, +2 for safety     Balance Overall balance assessment: Needs assistance Sitting-balance support: No upper extremity supported, Feet unsupported Sitting balance-Leahy Scale: Fair     Standing balance support: No upper extremity supported, During functional activity Standing balance-Leahy Scale: Fair Standing balance comment: CGA for safety                           ADL either performed or assessed with clinical judgement   ADL Overall ADL's : Needs assistance/impaired     Grooming: Wash/dry hands;Contact guard assist;Cueing for sequencing Grooming Details (indicate cue type and reason): pt able to stand at sink with CGA but needed max cues to sequene task, pt did not wash RUE despite max mutlimodal cues.     Lower Body Bathing: Maximal assistance;Sit to/from stand Lower Body Bathing Details (indicate cue type and reason): pt attempted posterior pericare after toileting but needed MAX A for cleanliness Upper Body Dressing : Minimal assistance;Sitting Upper Body Dressing Details (indicate cue type and reason): to don gown as back side cover     Toilet Transfer: Minimal assistance;Ambulation;Regular Toilet;Grab bars;Rolling walker (2 wheels) Toilet Transfer Details (indicate cue type and reason): MIN A  for steadying assist and RW mgmt Toileting- Clothing Manipulation and Hygiene: Maximal assistance;Sit to/from stand Toileting - Clothing Manipulation Details (indicate cue  type and reason): MAX A for cleanliness     Functional mobility during ADLs: Minimal assistance;Rolling walker (2 wheels) General ADL Comments: ADL participation impacted by R sided inattention, apraxia, impaired motor planning, impaired baseline vision    Extremity/Trunk Assessment Upper Extremity Assessment Upper Extremity Assessment: RUE deficits/detail RUE Deficits / Details: able to range pt through full ROM,however when asked to incorporate RUE into tasks pt reports he cant but pt initated with pt putting RUE on RW and pushing up from EOB with RUE RUE Coordination: decreased fine motor;decreased gross motor   Lower Extremity Assessment Lower Extremity Assessment: Defer to PT evaluation   Cervical / Trunk Assessment Cervical / Trunk Assessment: Normal    Vision   Additional Comments: R inattention noted, unable to fully assess vision d/t cog , 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 pt does touch the shapes but no in the correct sequence   Perception Perception Perception: Impaired Preception Impairment Details: Inattention/Neglect   Praxis Praxis Praxis: Impaired Praxis Impairment Details: Motor planning;Ideomotor Praxis-Other Comments: suspect apraxia, continue to assess   Communication Communication Communication: Impaired Factors Affecting Communication: Difficulty expressing self;Non - English speaking, interpreter not available   Cognition Arousal: Alert Behavior During Therapy: WFL for tasks assessed/performed, Flat affect Cognition: Difficult to assess Difficult to assess due to: Impaired communication, Non-English speaking           OT - Cognition Comments: spanish speaking and aphasic                 Following commands: Impaired Following commands impaired: Follows one step commands inconsistently, Follows one step commands with increased time      Cueing   Cueing  Techniques: Verbal cues, Gestural cues, Tactile cues, Visual cues  Exercises      Shoulder Instructions       General Comments wife and interpreter present during session    Pertinent Vitals/ Pain       Pain Assessment Pain Assessment: Faces Faces Pain Scale: Hurts a little bit Pain Location: generalized when assess RUE ROM Pain Descriptors / Indicators: Grimacing Pain Intervention(s): Monitored during session  Home Living                                          Prior Functioning/Environment              Frequency  Min 2X/week        Progress Toward Goals  OT Goals(current goals can now be found in the care plan section)  Progress towards OT goals: Progressing toward goals  Acute Rehab OT Goals Patient Stated Goal: none stated Time For Goal Achievement: 08/22/24 Potential to Achieve Goals: Good  Plan      Co-evaluation    PT/OT/SLP Co-Evaluation/Treatment: Yes Reason for Co-Treatment: Complexity of the patient's impairments (multi-system involvement);Necessary to address cognition/behavior during functional activity;To address functional/ADL transfers   OT goals addressed during session: ADL's and self-care;Strengthening/ROM      AM-PAC OT 6 Clicks Daily Activity     Outcome Measure   Help from another person eating meals?: A Little Help from another person taking care of personal grooming?: A Lot Help from another person  toileting, which includes using toliet, bedpan, or urinal?: A Lot Help from another person bathing (including washing, rinsing, drying)?: A Lot Help from another person to put on and taking off regular upper body clothing?: A Little Help from another person to put on and taking off regular lower body clothing?: A Lot 6 Click Score: 14    End of Session Equipment Utilized During Treatment: Gait belt;Rolling walker (2 wheels)  OT Visit Diagnosis: Unsteadiness on feet (R26.81);Muscle weakness (generalized)  (M62.81);Low vision, both eyes (H54.2);Other symptoms and signs involving cognitive function;Hemiplegia and hemiparesis;Cognitive communication deficit (R41.841) Symptoms and signs involving cognitive functions: Cerebral infarction Hemiplegia - Right/Left: Right Hemiplegia - caused by: Cerebral infarction   Activity Tolerance Patient tolerated treatment well   Patient Left in bed;with call bell/phone within reach   Nurse Communication Mobility status        Time: 8993-8970 OT Time Calculation (min): 23 min  Charges: OT General Charges $OT Visit: 1 Visit OT Treatments $Self Care/Home Management : 8-22 mins  Ronal Mallie POUR., COTA/L Acute Rehabilitation Services [phone]   Ronal Mallie Needy 08/11/2024, 12:24 PM

## 2024-08-11 NOTE — Plan of Care (Signed)
  Problem: Education: Goal: Knowledge of disease or condition will improve Outcome: Progressing Goal: Knowledge of patient specific risk factors will improve (DELETE if not current risk factor) Outcome: Progressing   Problem: Coping: Goal: Will identify appropriate support needs Outcome: Progressing

## 2024-08-11 NOTE — Progress Notes (Signed)
 " PROGRESS NOTE    Blake Santana  FMW:[id number] DOB: 1971-04-19 DOA: 08/06/2024 PCP: Leavy Lucas Sula, PA-C   Brief Narrative:    Blake Santana is a 54 y.o. male with medical history significant of ESRD on HD TTS, HFpEF, type 2 diabetes with diabetic retinopathy, anemia of chronic disease, hypertension, hyperlipidemia, PAD presented to the ED with complaints of aphasia, facial/hand weakness/numbness, and dysphagia.  Out of window for TNK at the time of arrival.  Blood pressure 231/94 on arrival but remainder of vital signs stable.  Labs notable for hemoglobin 12.1 (at baseline), sodium 134, potassium 6.4, chloride 94, bicarb 24, ethanol level <15, troponin 45.  EKG without acute ischemic changes.  Brain MRI showing acute left posterior MCA territory infarct.  Patient was given IV hydralazine , IV labetalol , and IV Ativan  in the ED.  Also given insulin /dextrose , calcium  gluconate, albuterol , sodium bicarb, and Lokelma  for hyperkalemia.     Admitted by [facility].   08/07/24: Received hemodialysis today. Evaluated by neurology, stroke workup ongoing. 08/07/24: Repeat MRI increased edema and slight midline shift , MRA with occluded left M2 MCA, severe right and moderate left PCA stenosis 08/11/24: Patient looks much brighter today.  He was up walking with therapy.  Still complains of dental pain.  Panorex ordered.  May benefit from antibiotics until he can follow-up with dentist.   Subjective: Looks brighter today.  Patient was seen walking down the halls with using a walker.  Complains of dental pain.   Assessment & Plan:   Principal Problem:   Acute CVA (cerebrovascular accident) (HCC) Active Problems:   Type 2 diabetes mellitus with other diabetic kidney complication (HCC)   Elevated troponin   Hyperkalemia   Hypertensive emergency     Assessment and Plan   Acute CVA -With extension.  Allow blood pressure to range from 140-180 for the next several days. Looks a bit more  alert today but still fairly impaired L MCA territory stroke per MRI.  CT angiogram shows left M2 MCA occlusion, severe PCA P2 stenosis. - Evaluated by neurology.  They have recommended aspirin  and Plavix  for 3 weeks followed by aspirin  alone. - , hemoglobin A1c, echo, PT OT, SLP - LDL 122, prescribed statin     Hyperkalemia ESRD on HD TTS Potassium 6.4 on presentation.  Medically treated in the ER, hemodialysis.  MWF schedule outpatient   Hypertensive emergency In the setting of medication nonadherence.  SBP initially in the 230s, improved to 180s after IV hydralazine  and IV labetalol  in the ED. Given acute CVA, allow permissive hypertension for 24 hours.  On Coreg  and amlodipine  at home,   Elevated troponin, questionable chest pain: Troponin elevated 45 and 49 in the setting of ESRD  Will continue to monitor in telemetry. Dual antiplatelet therapy for acute stroke.   chronic HFpEF Last echo done in January 2020 showing EF 50 to 55%, mild mitral regurgitation, mild tricuspid regurgitation.  Repeat echo ordered in the setting of acute CVA as above.  Patient mildly hypoxic in the ER, currently off supplemental oxygen   Type 2 diabetes Poorly controlled -hemoglobin A1c 10.2 on 02/29/2024.  Repeat A1c pending.  Continue sliding scale insulin  ACHS.   Anemia of chronic disease Hemoglobin stable.   Hyperlipidemia PAD Resume home medications as appropriate once reconciled  Dental Pain? - Ordered Panorex films of his teeth. - He is ambulating on the floor and is strong enough to undergo Panorex imaging. - Panorex results pending    DVT prophylaxis: heparin   injection 5,000 Units Start: 08/07/24 0600     Code Status: Full Code Family Communication: Wife at the bedside. Disposition Plan: Home with home health versus SNF for rehab/acute rehab Reason for continuing need for hospitalization: Ongoing rehab services  Objective: Vitals:   08/11/24 0829 08/11/24 0930 08/11/24 1256  08/11/24 1340  BP: (!) 154/75 (!) 154/75 (!) 179/79 (!) 171/75  Pulse: 69  69 67  Resp: 18  17 18   Temp: 97.9 F (36.6 C)  98.4 F (36.9 C) 98 F (36.7 C)  TempSrc: Oral  Oral   SpO2: 93%  94% 94%  Weight:    67.6 kg  Height:       No intake or output data in the 24 hours ending 08/11/24 1411  Filed Weights   08/09/24 1600 08/09/24 1944 08/11/24 1340  Weight: 67.3 kg 66.8 kg 67.6 kg    Examination: Speech is dysarthric. Physical Exam HENT:     Head: Normocephalic and atraumatic.     Mouth/Throat:     Mouth: Mucous membranes are moist.  Eyes:     Extraocular Movements: Extraocular movements intact.  Pulmonary:     Effort: Pulmonary effort is normal.  Abdominal:     Palpations: Abdomen is soft.  Skin:    General: Skin is warm and dry.  Neurological:     Mental Status: He is alert and oriented to person, place, and time.     Motor: Weakness present.     Coordination: Coordination abnormal.  Psychiatric:        Mood and Affect: Mood normal.        Thought Content: Thought content normal.     Data Reviewed: I have personally reviewed following labs and imaging studies  CBC: Recent Labs  Lab 08/06/24 1727 08/06/24 1746 08/08/24 0156 08/10/24 0215  WBC 8.8  --  8.9 7.5  NEUTROABS 5.9  --   --  4.5  HGB 12.1* 12.9* 11.5* 11.0*  HCT 37.3* 38.0* 34.8* 32.3*  MCV 97.9  --  94.6 93.6  PLT 162  --  152 139*   Basic Metabolic Panel: Recent Labs  Lab 08/07/24 0227 08/08/24 0156 08/08/24 1234 08/09/24 0152 08/10/24 0215  NA 136 133* 131* 132* 132*  K 5.7* 5.7* 5.4* 4.9 3.9  CL 95* 91* 90* 89* 91*  CO2 22 28 26 26 29   GLUCOSE 134* 147* 143* 153* 116*  BUN 58* 35* 39* 47* 22*  CREATININE 11.50* 7.95* 9.05* 10.20* 6.48*  CALCIUM  7.9* 7.8* 7.7* 7.4* 7.7*  PHOS  --  5.9*  --   --   --    GFR: Estimated Creatinine Clearance: 10.9 mL/min (A) (by C-G formula based on SCr of 6.48 mg/dL (H)). Liver Function Tests: Recent Labs  Lab 08/06/24 1727 08/08/24 0156   AST 13*  --   ALT 7  --   ALKPHOS 80  --   BILITOT 0.3  --   PROT 7.6  --   ALBUMIN 4.1 3.8   No results for input(s): LIPASE, AMYLASE in the last 168 hours. No results for input(s): AMMONIA in the last 168 hours. Coagulation Profile: Recent Labs  Lab 08/06/24 1727  INR 1.0   Cardiac Enzymes: No results for input(s): CKTOTAL, CKMB, CKMBINDEX, TROPONINI in the last 168 hours. ProBNP, BNP (last 5 results) No results for input(s): PROBNP, BNP in the last 8760 hours. HbA1C: No results for input(s): HGBA1C in the last 72 hours.  CBG: Recent Labs  Lab 08/10/24 1121 08/10/24 1633  08/10/24 2133 08/11/24 0613 08/11/24 1257  GLUCAP 129* 125* 96 96 182*   Lipid Profile: No results for input(s): CHOL, HDL, LDLCALC, TRIG, CHOLHDL, LDLDIRECT in the last 72 hours.  Thyroid Function Tests: No results for input(s): TSH, T4TOTAL, FREET4, T3FREE, THYROIDAB in the last 72 hours. Anemia Panel: No results for input(s): VITAMINB12, FOLATE, FERRITIN, TIBC, IRON , RETICCTPCT in the last 72 hours. Sepsis Labs: No results for input(s): PROCALCITON, LATICACIDVEN in the last 168 hours.  Recent Results (from the past 240 hours)  MRSA Next Gen by PCR, Nasal     Status: None   Collection Time: 08/09/24  1:20 PM   Specimen: Nasal Mucosa; Nasal Swab  Result Value Ref Range Status   MRSA by PCR Next Gen NOT DETECTED NOT DETECTED Final    Comment: (NOTE) The GeneXpert MRSA Assay (FDA approved for NASAL specimens only), is one component of a comprehensive MRSA colonization surveillance program. It is not intended to diagnose MRSA infection nor to guide or monitor treatment for MRSA infections. Test performance is not FDA approved in patients less than 12 years old. Performed at [facility], [address], [place], [place] [address]      Radiology Studies: No results found.   Scheduled Meds:  amLODipine   10 mg Oral Daily    aspirin  EC  81 mg Oral Daily   Chlorhexidine  Gluconate Cloth  6 each Topical Q0600   clopidogrel   75 mg Oral Daily   ezetimibe   10 mg Oral Daily   [START ON 08/12/2024] heparin   2,000 Units Dialysis Once in dialysis   heparin   5,000 Units Subcutaneous Q8H   insulin  aspart  0-5 Units Subcutaneous QHS   insulin  aspart  0-6 Units Subcutaneous TID WC   rosuvastatin   10 mg Oral Daily   Continuous Infusions:     LOS: 5 days   Time spent: 32 minutes  Lonni KANDICE Moose, MD  [facility]  08/11/2024, 2:11 PM   "

## 2024-08-11 NOTE — Progress Notes (Signed)
 [facility] Progress Note  Subjective:  Seen in room Per the wife was eating solid foods w/o choking  Presentation summary: 54 y.o. year-old w/ PMH as below who presented to ED today complaining of trouble speaking and choking when he was eating dinner last night.  Then around noon he began having slurred speech and numbness on the left side of his face.  No extremity involvement. In ED BP 231/ 94, HR 72, RR 18, 100% on RA. Labs showed Na 134, K 6.4, bun 50, creat 10. MRI showed acute left posterior MCA territory infarct involving the left anterior temporal lobe, overlying parietal lobe and posterior left insula.  Some edema without mass effect.  We were asked to see for dialysis.   Vitals:   08/11/24 1400 08/11/24 1430 08/11/24 1500 08/11/24 1530  BP: (!) 153/80 (!) 166/78 (!) 161/79 (!) 159/77  Pulse: 66 65 67 65  Resp: 16 18 19 16   Temp:      TempSrc:      SpO2: 95% 96% 95% 97%  Weight:      Height:        Exam: Gen alert, no distress Sclera anicteric, throat clear  No jvd or bruits Chest clear bilat to bases RRR no MRG Abd soft ntnd no mass or ascites +bs Ext no LE or UE edema, no other edema Neuro is alert, nf    LUA AVF+bruit    Home bp meds: Norvasc  Coreg     [facility] HD: [facility] 3h B350 65.2kg  AVF  Hep 2000  Last OP HD 1/02, post wt 67.2kg Hep B SAg neg 07/10/24 Heb B SAb +Immune 05/15/24     Assessment/ Plan:   # ESRD - on HD MWF - next HD today   # acute CVA  - L posterior MCA territory per MRI - aphasia - had CVA extension  - PT seeing, CIR evaluating   # HTN - on norvasc  and coreg  - SBPs 150 /75 range   - per neurology, try to keep SBP 140- 180   # Volume - no vol excess on exam, on RA - up 1-2 kg by wts - initial CXR showed vasc congestion - on RA here  - 2 L off w/ HD 1/05 and 0.7 L w/ HD 1/07 - UF 1 L w/ HD today, try to keep SBP 140- 180     # Anemia of esrd - Hb 12- 13 here, follow, no esa needs    Myer Fret  MD  [facility] 08/11/2024, 3:48 PM  Recent Labs  Lab 08/06/24 1727 08/06/24 1746 08/08/24 0156 08/08/24 1234 08/09/24 0152 08/10/24 0215  HGB 12.1*   < > 11.5*  --   --  11.0*  ALBUMIN 4.1  --  3.8  --   --   --   CALCIUM  8.2*   < > 7.8*   < > 7.4* 7.7*  PHOS  --   --  5.9*  --   --   --   CREATININE 10.60*   < > 7.95*   < > 10.20* 6.48*  K 6.4*   < > 5.7*   < > 4.9 3.9   < > = values in this interval not displayed.   No results for input(s): IRON , TIBC, FERRITIN in the last 168 hours. Inpatient medications:  amLODipine   10 mg Oral Daily   aspirin  EC  81 mg Oral Daily   Chlorhexidine  Gluconate Cloth  6 each Topical Q0600  clopidogrel   75 mg Oral Daily   ezetimibe   10 mg Oral Daily   heparin   5,000 Units Subcutaneous Q8H   insulin  aspart  0-5 Units Subcutaneous QHS   insulin  aspart  0-6 Units Subcutaneous TID WC   rosuvastatin   10 mg Oral Daily     acetaminophen  **OR** acetaminophen , [START ON 08/12/2024] heparin , hydrALAZINE , pentafluoroprop-tetrafluoroeth

## 2024-08-11 NOTE — Progress Notes (Signed)
 Physical Therapy Treatment Patient Details Name: Blake Santana MRN: [id number] DOB: Feb 07, 1971 Today's Date: 08/11/2024   History of Present Illness Pt is 54 y.o. male presented to [facility] on 08/07/23 for aphasia. MRI showed acute lt posterior MCA infarct. PMH - ESRD on HD, DM, Diabetic retinopahty, HTN.    PT Comments  Pt with fair tolerance to treatment today. Co-treat with OT. Trialed RW today for gait with pt noted to have extreme forward posture and RW too far out in front of him. Constant cues for upright posture and proximity to RW however pt stated through in person interpreter that he couldnt do it and didn't elaborate why. CGA/Min A overall. Attempted dynamic reaching activities upon returning to room however pt continued to state through interpreter that he can't do it. No change in DC/DME recs at this time. PT will continue to follow.      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 Restrictions Weight Bearing Restrictions Per Provider Order: No     Mobility  Bed Mobility Overal bed mobility: Needs Assistance Bed Mobility: Supine to Sit, Sit to Supine     Supine to sit: Contact guard, +2 for safety/equipment Sit to supine: Contact guard assist, +2 for safety/equipment   General bed mobility comments: CGA for safety    Transfers Overall transfer level: Needs assistance Equipment used: Rolling walker (2 wheels) Transfers: Sit to/from Stand Sit to Stand:  Contact guard assist, +2 safety/equipment           General transfer comment: CGA to rise from EOB, +2 for safety    Ambulation/Gait Ambulation/Gait assistance: Contact guard assist, Min assist, +2 safety/equipment Gait Distance (Feet): 150 Feet Assistive device: Rolling walker (2 wheels) Gait Pattern/deviations: Step-through pattern, Decreased step length - right, Decreased step length - left, Decreased stride length, Drifts right/left, Trunk flexed Gait velocity: decr     General Gait Details: trialed RW today for gait with pt noted to have extreme forward posture and RW to far out in front of him. Constant cues for upright posture and proximity to RW however pt stated through in person interpreter that he couldnt do it. CGA/Min A overall.   Stairs             Wheelchair Mobility     Tilt Bed    Modified Rankin (Stroke Patients Only) Modified Rankin (Stroke Patients Only) Pre-Morbid Rankin Score: Slight disability Modified Rankin: Moderately severe disability     Balance Overall balance assessment: Needs assistance Sitting-balance support: No upper extremity supported, Feet unsupported Sitting balance-Leahy Scale: Fair     Standing balance support: No upper extremity supported, During functional activity Standing balance-Leahy Scale: Fair Standing balance comment: CGA for safety                            Communication Communication Communication: Impaired Factors Affecting Communication: Difficulty expressing self;Non - English speaking, interpreter not available  Cognition Arousal: Alert Behavior During Therapy: WFL for tasks assessed/performed, Flat affect  Following commands: Impaired Following commands impaired: Follows one step commands inconsistently, Follows one step commands with increased time    Cueing Cueing Techniques: Verbal cues, Gestural cues, Tactile cues, Visual cues  Exercises       General Comments General comments (skin integrity, edema, etc.): Wife present. In person interpreter Shanda utilized.      Pertinent Vitals/Pain Pain Assessment Pain Assessment: Faces Faces Pain Scale: Hurts a little bit Pain Location: generalized when assess RUE ROM Pain Descriptors / Indicators: Grimacing    Home Living                          Prior Function            PT Goals (current goals can now be found in the care plan section) Progress towards PT goals: Progressing toward goals    Frequency    Min 2X/week      PT Plan      Co-evaluation PT/OT/SLP Co-Evaluation/Treatment: Yes Reason for Co-Treatment: Complexity of the patient's impairments (multi-system involvement);Necessary to address cognition/behavior during functional activity;To address functional/ADL transfers PT goals addressed during session: Mobility/safety with mobility;Strengthening/ROM;Proper use of DME OT goals addressed during session: ADL's and self-care;Strengthening/ROM      AM-PAC PT 6 Clicks Mobility   Outcome Measure  Help needed turning from your back to your side while in a flat bed without using bedrails?: A Little Help needed moving from lying on your back to sitting on the side of a flat bed without using bedrails?: A Little Help needed moving to and from a bed to a chair (including a wheelchair)?: A Little Help needed standing up from a chair using your arms (e.g., wheelchair or bedside chair)?: A Little Help needed to walk in hospital room?: A Lot Help needed climbing 3-5 steps with a railing? : Total 6 Click Score: 15    End of Session Equipment Utilized During Treatment: Gait belt Activity Tolerance: Patient tolerated treatment well Patient left: in bed;with call bell/phone within reach;with bed alarm set Nurse Communication: Mobility status PT Visit Diagnosis: Unsteadiness on feet (R26.81);Other abnormalities of gait and mobility (R26.89);Muscle weakness  (generalized) (M62.81)     Time: 1007-1030 PT Time Calculation (min) (ACUTE ONLY): 23 min  Charges:    $Gait Training: 8-22 mins PT General Charges $$ ACUTE PT VISIT: 1 Visit                     Jammie Clink B, PT, DPT Acute Rehab Services [phone]    Shaniya Tashiro 08/11/2024, 1:16 PM

## 2024-08-11 NOTE — H&P (Shared)
 "   Physical Medicine and Rehabilitation Admission H&P    Chief Complaint  Patient presents with   Functional deficits due to CVA   HPI: Blake Santana is a 54 year old male with PMHx ESRD on HD TTS, HFpEF, DM2 with diabetic retinopathy, anemia of chronic disease, HTN, HLD, PAD who presented to [facility] on 08/06/24 with difficulty speaking and choking with dinner the night prior. Slurred speech and left sided facial numbness returned around noon. In the BP 231/94 and labs revealed Na 134, K 6.4, BUN 50, cr 1036, Ca 8.2, alb 4.1 and WBC 8 K. NIHSS 7. CT head showed focal cortical and subcortical hypodensity at the left temporoparietal lobe suspicious for acute or subacute infarct. Neurology consulted and MRI showed an acute left posterior MCA territory infarct involving the left anterior temporal lobe, overlying parietal lobe and posterior left insula, some edema without mass effect.  Recommended antithrombotic asprin and plavix  x 21 days followed by aspirin  monotherapy.      ROS      Past Medical History:  Diagnosis Date   Acute respiratory failure with hypoxia (HCC) 09/07/2018   Anemia of chronic kidney failure 03/11/2017   Cataracts, bilateral 2019   Chest pain 09/01/2018   minimal luminal irregularities   Chronic diastolic CHF (congestive heart failure) (HCC)    Chronic kidney disease 03/11/2017   Dialysis Tu/Th/Sa   COVID 2022   hospitalized   Diabetes mellitus without complication (HCC) 1999   Edema 01/14/2017   Headache    Hypercholesterolemia    Hypertension 03/11/2017   Hypertensive retinopathy of both eyes 2019   Iron  deficiency anemia 06/18/2016   Pneumonia 09/07/2018   Proliferative diabetic retinopathy with macular edema (HCC) 2019   Retinal hemorrhage, bilateral and left vitreous hemorrhage.  Being treated with Lucentis.  Dr. Raj   RSV (respiratory syncytial virus infection) 08/2017   Past Surgical History:  Procedure  Laterality Date   A/V FISTULAGRAM Left 01/19/2018   Procedure: A/V FISTULAGRAM - Left AV;  Surgeon: Sheree Penne Bruckner, MD;  Location: [facility] INVASIVE CV LAB;  Service: Cardiovascular;  Laterality: Left;   AV FISTULA PLACEMENT Left 08/09/2017   Procedure: LEFT RADIOCEPHALIC ARTERIOVENOUS (AV) FISTULA CREATION;  Surgeon: Oris Krystal FALCON, MD;  Location: [facility] OR;  Service: Vascular;  Laterality: Left;   AV FISTULA PLACEMENT Left 02/02/2018   Procedure: ARTERIOVENOUS (AV) FISTULA CREATION LEFT UPPER EXTREMITY;  Surgeon: Oris Krystal FALCON, MD;  Location: [facility] OR;  Service: Vascular;  Laterality: Left;   INSERTION OF DIALYSIS CATHETER  08/09/2017   INSERTION OF DIALYSIS CATHETER Right 08/09/2017   Procedure: INSERTION OF DIALYSIS CATHETER RIGHT INTERNAL JUGULAR;  Surgeon: Oris Krystal FALCON, MD;  Location: [facility] OR;  Service: Vascular;  Laterality: Right;   IR DIALY SHUNT INTRO NEEDLE/INTRACATH INITIAL W/IMG LEFT Left 07/10/2020   IR DIALY SHUNT INTRO NEEDLE/INTRACATH INITIAL W/IMG LEFT Left 04/14/2021   IR REMOVAL TUN CV CATH W/O FL  07/01/2018   LEFT HEART CATH AND CORONARY ANGIOGRAPHY N/A 09/05/2018   Procedure: LEFT HEART CATH AND CORONARY ANGIOGRAPHY;  Surgeon: Dann Candyce RAMAN, MD;  Location: [facility] INVASIVE CV LAB;  Service: Cardiovascular;  Laterality: N/A;   OPEN REDUCTION INTERNAL FIXATION (ORIF) DISTAL RADIAL FRACTURE Left 11/20/2021   Procedure: POSSIBLE OPEN REDUCTION INTERNAL FIXATION (ORIF) DISTAL RADIAL FRACTURE;  Surgeon: Kendal Franky SQUIBB, MD;  Location: [facility] OR;  Service: Orthopedics;  Laterality: Left;   ORIF ANKLE FRACTURE Left 11/20/2021   Procedure: OPEN REDUCTION INTERNAL FIXATION (ORIF) ANKLE  PILON FRACTURE;  Surgeon: Kendal Franky SQUIBB, MD;  Location: [facility] OR;  Service: Orthopedics;  Laterality: Left;   ORIF TIBIA PLATEAU Right 11/20/2021   Procedure: OPEN REDUCTION INTERNAL FIXATION (ORIF) TIBIAL PLATEAU;  Surgeon: Kendal Franky SQUIBB, MD;  Location: [facility] OR;  Service: Orthopedics;  Laterality: Right;    PARATHYROIDECTOMY N/A 10/26/2022   Procedure: total PARATHYROIDECTOMY;  Surgeon: Eletha Boas, MD;  Location: [facility] OR;  Service: General;  Laterality: N/A;   PARATHYROIDECTOMY Right 10/26/2022   Procedure: AUTOTRANSPLANT TO RIGHT FOREARM;  Surgeon: Eletha Boas, MD;  Location: [facility] OR;  Service: General;  Laterality: Right;   Family History  Problem Relation Age of Onset   Diabetes Mother    Kidney disease Mother        renal failure--diabetes, cause of death   Hypertension Mother    Diabetes Father    Kidney disease Father        Cause of death:  kidney failure form diabetes   Alcohol abuse Father    Diabetes Sister    Social History:  reports that he has quit smoking. His smoking use included cigarettes. He has been exposed to tobacco smoke. He has never used smokeless tobacco. He reports that he does not currently use alcohol. He reports that he does not use drugs. Allergies: Allergies[1] Medications Prior to Admission  Medication Sig Dispense Refill   acetaminophen  (TYLENOL ) 500 MG tablet Take 1,000 mg by mouth every 6 (six) hours as needed for moderate pain or headache.     AgaMatrix Ultra-Thin Lancets MISC Check blood glucose twice daily before meals. 100 each 11   amLODipine  (NORVASC ) 10 MG tablet Take 1 tablet (10 mg total) by mouth daily. (Patient not taking: Reported on 08/07/2024) 90 tablet 3   aspirin -acetaminophen -caffeine (EXCEDRIN MIGRAINE) 250-250-65 MG tablet Take 1-2 tablets by mouth every 6 (six) hours as needed for headache.     calcitRIOL  (ROCALTROL ) 0.5 MCG capsule Take 6 capsules (3 mcg total) by mouth 2 (two) times daily. 60 capsule 4   calcium  carbonate (TUMS - DOSED IN MG ELEMENTAL CALCIUM ) 500 MG chewable tablet Chew 5 tablets (1,000 mg of elemental calcium  total) by mouth 4 (four) times daily. 120 tablet 2   carvedilol  (COREG ) 12.5 MG tablet Take 1 tablet (12.5 mg total) by mouth 2 (two) times daily. 180 tablet 3   ezetimibe  (ZETIA ) 10 MG tablet Take 1  tablet (10 mg total) by mouth daily. (Patient not taking: Reported on 02/29/2024) 90 tablet 3   glipiZIDE  (GLUCOTROL ) 10 MG tablet Take 1 tablet (10 mg total) by mouth daily before breakfast. 90 tablet 3   glucose blood (TRUE METRIX BLOOD GLUCOSE TEST) test strip Use as instructed 100 each 12   lanthanum  (FOSRENOL ) 1000 MG chewable tablet Chew 1 tablet (1,000 mg total) by mouth 2 (two) times daily with a meal. 180 tablet 3   rosuvastatin  (CRESTOR ) 10 MG tablet Take 1 tablet (10 mg total) by mouth daily. 90 tablet 3   TRUEplus Lancets 28G MISC Use as directed 100 each 4      Home: Home Living Family/patient expects to be discharged to:: Private residence Living Arrangements: Spouse/significant other (and 3 children, youngest 38 yo) Available Help at Discharge:  (s/o works 6 pm until 9 pm, chidlren provide during those times) Type of Home: House Home Access: Stairs to enter [employer] of Steps: 2-3 Entrance Stairs-Rails: None Home Layout: One level Bathroom Shower/Tub: Engineer, [employer] Systems: Standard Bathroom Accessibility: Yes Home Equipment: None  Lives  With: Significant other, Family   Functional History: Prior Function Prior Level of Function : Independent/Modified Independent Mobility Comments: No assistive device ADLs Comments: independent with BADL; does not drive or cook secondary to L eye visual deficits at baseline.  Functional Status:  Mobility: Bed Mobility Overal bed mobility: Needs Assistance Bed Mobility: Supine to Sit, Sit to Supine Supine to sit: Contact guard, +2 for safety/equipment Sit to supine: Contact guard assist, +2 for safety/equipment General bed mobility comments: CGA for safety Transfers Overall transfer level: Needs assistance Equipment used: Rolling walker (2 wheels) Transfers: Sit to/from Stand Sit to Stand: Contact guard assist, +2 safety/equipment General transfer comment: CGA to rise from EOB, +2 for  safety Ambulation/Gait Ambulation/Gait assistance: Max assist, Mod assist Gait Distance (Feet): 100 Feet Assistive device: 1 person hand held assist Gait Pattern/deviations: Step-through pattern, Decreased step length - right, Decreased step length - left, Decreased stride length, Drifts right/left General Gait Details: Pt with 1 major LOB today at EOB requiring Max A to correct. Mostly Mod A via HHA to ambulate in hallway however required dense cues for navigation due to visual deficits. Gait velocity: decr Gait velocity interpretation: <1.31 ft/sec, indicative of household ambulator    ADL: ADL Overall ADL's : Needs assistance/impaired Eating/Feeding: Moderate assistance, Sitting Grooming: Wash/dry hands, Contact guard assist, Cueing for sequencing Grooming Details (indicate cue type and reason): pt able to stand at sink with CGA but needed max cues to sequene task, pt did not wash RUE despite max mutlimodal cues. Upper Body Bathing: Sitting, Maximal assistance Lower Body Bathing: Maximal assistance, Sit to/from stand Lower Body Bathing Details (indicate cue type and reason): pt attempted posterior pericare after toileting but needed MAX A for cleanliness Upper Body Dressing : Minimal assistance, Sitting Upper Body Dressing Details (indicate cue type and reason): to don gown as back side cover Lower Body Dressing: Maximal assistance, Sit to/from stand Toilet Transfer: Minimal assistance, Ambulation, Regular Toilet, Grab bars, Rolling walker (2 wheels) Toilet Transfer Details (indicate cue type and reason): MIN A for steadying assist and RW mgmt Toileting- Clothing Manipulation and Hygiene: Maximal assistance, Sit to/from stand Toileting - Clothing Manipulation Details (indicate cue type and reason): MAX A for cleanliness Functional mobility during ADLs: Minimal assistance, Rolling walker (2 wheels) General ADL Comments: ADL participation impacted by R sided inattention, apraxia,  impaired motor planning, impaired baseline vision  Cognition: Cognition Overall Cognitive Status: Difficult to assess Orientation Level: Disoriented X4 Cognition Arousal: Alert Behavior During Therapy: WFL for tasks assessed/performed, Flat affect Overall Cognitive Status: Difficult to assess  Physical Exam: Blood pressure (!) 154/75, pulse 69, temperature 97.9 F (36.6 C), temperature source Oral, resp. rate 18, height 5' 2 (1.575 m), weight 66.8 kg, SpO2 93%. Physical Exam  Results for orders placed or performed during the hospital encounter of 08/06/24 (from the past 48 hours)  MRSA Next Gen by PCR, Nasal     Status: None   Collection Time: 08/09/24  1:20 PM   Specimen: Nasal Mucosa; Nasal Swab  Result Value Ref Range   MRSA by PCR Next Gen NOT DETECTED NOT DETECTED    Comment: (NOTE) The GeneXpert MRSA Assay (FDA approved for NASAL specimens only), is one component of a comprehensive MRSA colonization surveillance program. It is not intended to diagnose MRSA infection nor to guide or monitor treatment for MRSA infections. Test performance is not FDA approved in patients less than 75 years old. Performed at [facility], [address] [place], [place] [address]  Hepatitis B surface antibody,quantitative     Status: None   Collection Time: 08/09/24  4:15 PM  Result Value Ref Range   Hep B S AB Quant (Post) 35.8 Immunity>10 mIU/mL    Comment: (NOTE)  Status of Immunity                     Anti-HBs Level  ------------------                     -------------- Inconsistent with Immunity                  0.0 - 10.0 Consistent with Immunity                         >10.0 Performed At: [facility] [address] [place], [place] [id number] Jennette Shorter MD Ey:[id number]   Hepatitis B surface antigen     Status: None   Collection Time: 08/09/24  4:15 PM  Result Value Ref Range   Hepatitis B Surface Ag NON REACTIVE NON REACTIVE    Comment: Performed at  [facility], [address] [place], [place] [address]  Glucose, capillary     Status: Abnormal   Collection Time: 08/09/24  9:45 PM  Result Value Ref Range   Glucose-Capillary 110 (H) 70 - 99 mg/dL    Comment: Glucose reference range applies only to samples taken after fasting for at least 8 hours.   Comment 1 Notify RN    Comment 2 Document in Chart   Basic metabolic panel     Status: Abnormal   Collection Time: 08/10/24  2:15 AM  Result Value Ref Range   Sodium 132 (L) 135 - 145 mmol/L   Potassium 3.9 3.5 - 5.1 mmol/L   Chloride 91 (L) 98 - 111 mmol/L   CO2 29 22 - 32 mmol/L   Glucose, Bld 116 (H) 70 - 99 mg/dL    Comment: Glucose reference range applies only to samples taken after fasting for at least 8 hours.   BUN 22 (H) 6 - 20 mg/dL   Creatinine, Ser 3.51 (H) 0.61 - 1.24 mg/dL   Calcium  7.7 (L) 8.9 - 10.3 mg/dL   GFR, Estimated 9 (L) >60 mL/min    Comment: (NOTE) Calculated using the CKD-EPI Creatinine Equation (2021)    Anion gap 12 5 - 15    Comment: Performed at [facility], [address] [place], [place] [address]  CBC with Differential/Platelet     Status: Abnormal   Collection Time: 08/10/24  2:15 AM  Result Value Ref Range   WBC 7.5 4.0 - 10.5 K/uL   RBC 3.45 (L) 4.22 - 5.81 MIL/uL   Hemoglobin 11.0 (L) 13.0 - 17.0 g/dL   HCT 67.6 (L) 60.9 - 47.9 %   MCV 93.6 80.0 - 100.0 fL   MCH 31.9 26.0 - 34.0 pg   MCHC 34.1 30.0 - 36.0 g/dL   RDW 86.9 88.4 - 84.4 %   Platelets 139 (L) 150 - 400 K/uL   nRBC 0.0 0.0 - 0.2 %   Neutrophils Relative % 60 %   Neutro Abs 4.5 1.7 - 7.7 K/uL   Lymphocytes Relative 22 %   Lymphs Abs 1.6 0.7 - 4.0 K/uL   Monocytes Relative 11 %   Monocytes Absolute 0.8 0.1 - 1.0 K/uL   Eosinophils Relative 6 %   Eosinophils Absolute 0.5 0.0 - 0.5 K/uL  Basophils Relative 1 %   Basophils Absolute 0.1 0.0 - 0.1 K/uL   Immature Granulocytes 0 %   Abs Immature Granulocytes 0.03 0.00 - 0.07 K/uL    Comment: Performed at [facility], [address], [place], [place] [address]  Glucose, capillary     Status: None   Collection Time: 08/10/24  6:47 AM  Result Value Ref Range   Glucose-Capillary 98 70 - 99 mg/dL    Comment: Glucose reference range applies only to samples taken after fasting for at least 8 hours.  Glucose, capillary     Status: Abnormal   Collection Time: 08/10/24 11:21 AM  Result Value Ref Range   Glucose-Capillary 129 (H) 70 - 99 mg/dL    Comment: Glucose reference range applies only to samples taken after fasting for at least 8 hours.  Glucose, capillary     Status: Abnormal   Collection Time: 08/10/24  4:33 PM  Result Value Ref Range   Glucose-Capillary 125 (H) 70 - 99 mg/dL    Comment: Glucose reference range applies only to samples taken after fasting for at least 8 hours.  Glucose, capillary     Status: None   Collection Time: 08/10/24  9:33 PM  Result Value Ref Range   Glucose-Capillary 96 70 - 99 mg/dL    Comment: Glucose reference range applies only to samples taken after fasting for at least 8 hours.  Glucose, capillary     Status: None   Collection Time: 08/11/24  6:13 AM  Result Value Ref Range   Glucose-Capillary 96 70 - 99 mg/dL    Comment: Glucose reference range applies only to samples taken after fasting for at least 8 hours.   No results found.    Blood pressure (!) 154/75, pulse 69, temperature 97.9 F (36.6 C), temperature source Oral, resp. rate 18, height 5' 2 (1.575 m), weight 66.8 kg, SpO2 93%.  Medical Problem List and Plan: 1. Functional deficits secondary to ***  -patient may *** shower  -ELOS/Goals: ***  2.  Antithrombotics: -DVT/anticoagulation:  {VTE PROPHYLAXIS/ANTICOAGULATION - [id number]}  -antiplatelet therapy: ***  3. Pain Management: ***  4. Mood/Behavior/Sleep: LCSW to follow for evaluation and support when available.   -antipsychotic agents: ***  -sleep:   5. Neuropsych/cognition: This patient *** capable of making decisions on ***  own behalf.  6. Skin/Wound Care: Routine pressure relief measures.   7. Fluids/Electrolytes/Nutrition: Monitor I&O and weight. Follow up labs CBC/CMP     8.  9.  10.     ***  Daphne LOISE Satterfield, NP 08/11/2024       [1] No Known Allergies "

## 2024-08-12 ENCOUNTER — Other Ambulatory Visit: Payer: Self-pay | Admitting: Physician Assistant

## 2024-08-12 DIAGNOSIS — I779 Disorder of arteries and arterioles, unspecified: Secondary | ICD-10-CM

## 2024-08-12 DIAGNOSIS — I63512 Cerebral infarction due to unspecified occlusion or stenosis of left middle cerebral artery: Secondary | ICD-10-CM

## 2024-08-12 DIAGNOSIS — I639 Cerebral infarction, unspecified: Secondary | ICD-10-CM

## 2024-08-12 LAB — GLUCOSE, CAPILLARY
Glucose-Capillary: 100 mg/dL — ABNORMAL HIGH (ref 70–99)
Glucose-Capillary: 190 mg/dL — ABNORMAL HIGH (ref 70–99)
Glucose-Capillary: 217 mg/dL — ABNORMAL HIGH (ref 70–99)
Glucose-Capillary: 153 mg/dL — ABNORMAL HIGH (ref 70–99)

## 2024-08-12 NOTE — Progress Notes (Signed)
 [facility] Progress Note  Subjective:  Seen in room Per wife he had 2 bm's yesterday  Presentation summary: 54 y.o. year-old w/ PMH as below who presented to ED today complaining of trouble speaking and choking when he was eating dinner last night.  Then around noon he began having slurred speech and numbness on the left side of his face.  No extremity involvement. In ED BP 231/ 94, HR 72, RR 18, 100% on RA. Labs showed Na 134, K 6.4, bun 50, creat 10. MRI showed acute left posterior MCA territory infarct involving the left anterior temporal lobe, overlying parietal lobe and posterior left insula.  Some edema without mass effect.  We were asked to see for dialysis.   Vitals:   08/12/24 0306 08/12/24 0744 08/12/24 0932 08/12/24 1207  BP: (!) 142/70 (!) 163/75 (!) 163/75 (!) 151/70  Pulse: 68 66  70  Resp: 19 17  19   Temp: 98.8 F (37.1 C) 98.2 F (36.8 C)  97.6 F (36.4 C)  TempSrc: Axillary Oral  Oral  SpO2: 98% 97%  95%  Weight:      Height:        Exam: Gen alert, no distress, aphasic Sclera anicteric, throat clear  No jvd or bruits Chest clear bilat to bases RRR no MRG Abd soft ntnd no mass or ascites +bs Ext no LE or UE edema, no other edema Neuro is alert, nf    LUA AVF+bruit    Home bp meds: Norvasc  Coreg     [facility] HD: [facility] 3h B350 65.2kg  AVF  Hep 2000  Last OP HD 1/02, post wt 67.2kg Hep B SAg neg 07/10/24 Heb B SAb +Immune 05/15/24     Assessment/ Plan:   # ESRD - on HD MWF - next HD monday   # acute CVA  - L posterior MCA territory per MRI - aphasia - had CVA extension    # HTN - on norvasc  (coreg  dc'd)  - per neuro -> try to maintain SBP 130-150 given L M2 occlusion   # Volume - no vol excess on exam, on RA - up 1-2 kg by wts - initial CXR no edema - on RA here  - UF 0.7- 2 L here  - not eating/ drinking a lot    # Anemia of esrd - Hb 11- 13 here, follow, no esa needs   # Dispo - home w/ HH vs SNF for  rehab   Myer Fret MD  [facility] 08/12/2024, 12:11 PM  Recent Labs  Lab 08/06/24 1727 08/06/24 1746 08/08/24 0156 08/08/24 1234 08/09/24 0152 08/10/24 0215  HGB 12.1*   < > 11.5*  --   --  11.0*  ALBUMIN 4.1  --  3.8  --   --   --   CALCIUM  8.2*   < > 7.8*   < > 7.4* 7.7*  PHOS  --   --  5.9*  --   --   --   CREATININE 10.60*   < > 7.95*   < > 10.20* 6.48*  K 6.4*   < > 5.7*   < > 4.9 3.9   < > = values in this interval not displayed.   No results for input(s): IRON , TIBC, FERRITIN in the last 168 hours. Inpatient medications:  amLODipine   10 mg Oral Daily   aspirin  EC  81 mg Oral Daily   Chlorhexidine  Gluconate Cloth  6 each Topical Q0600   clopidogrel   75  mg Oral Daily   ezetimibe   10 mg Oral Daily   heparin   5,000 Units Subcutaneous Q8H   insulin  aspart  0-5 Units Subcutaneous QHS   insulin  aspart  0-6 Units Subcutaneous TID WC   rosuvastatin   10 mg Oral Daily    ampicillin -sulbactam (UNASYN ) IV 1.5 g (08/12/24 0751)    acetaminophen  **OR** acetaminophen , hydrALAZINE 

## 2024-08-12 NOTE — Progress Notes (Signed)
 " PROGRESS NOTE    Blake Santana  FMW:[id number] DOB: 1971/07/11 DOA: 08/06/2024 PCP: Leavy Lucas Sula, PA-C   Brief Narrative:    Blake Santana is a 54 y.o. male with medical history significant of ESRD on HD TTS, HFpEF, type 2 diabetes with diabetic retinopathy, anemia of chronic disease, hypertension, hyperlipidemia, PAD presented to the ED with complaints of aphasia, facial/hand weakness/numbness, and dysphagia.  Out of window for TNK at the time of arrival.  Blood pressure 231/94 on arrival but remainder of vital signs stable.  Labs notable for hemoglobin 12.1 (at baseline), sodium 134, potassium 6.4, chloride 94, bicarb 24, ethanol level <15, troponin 45.  EKG without acute ischemic changes.  Brain MRI showing acute left posterior MCA territory infarct.  Patient was given IV hydralazine , IV labetalol , and IV Ativan  in the ED.  Also given insulin /dextrose , calcium  gluconate, albuterol , sodium bicarb, and Lokelma  for hyperkalemia.     Admitted by [facility].   08/07/24: Received hemodialysis today. Evaluated by neurology, stroke workup ongoing. 08/07/24: Repeat MRI increased edema and slight midline shift , MRA with occluded left M2 MCA, severe right and moderate left PCA stenosis 08/11/24: Patient looks much brighter today.  He was up walking with therapy.  Still complains of dental pain.  Panorex ordered.  May benefit from antibiotics until he can follow-up with dentist.   Subjective: Patient's son is at the bedside.  He is a Programme researcher, broadcasting/film/video.  Patient states his dental pain is improved but not completely resolved. Review of the panorex shows : Resorption of the maxilla and prominent periapical erosions involving tooth 1 in keeping with advanced periodontal disease.    Principal Problem:   Acute CVA (cerebrovascular accident) [facility]) Active Problems:   Type 2 diabetes mellitus with other diabetic kidney complication (HCC)   Elevated troponin   Hyperkalemia   Hypertensive  emergency     Assessment and Plan   Acute CVA -With extension.  Allow blood pressure to range from 140-180 for the next several days. Looks a bit more alert today but still fairly impaired L MCA territory stroke per MRI.  CT angiogram shows left M2 MCA occlusion, severe PCA P2 stenosis. - Evaluated by neurology.  They have recommended aspirin  and Plavix  for 3 weeks followed by aspirin  alone. - , hemoglobin A1c, echo, PT OT, SLP - LDL 122, prescribed statin -Blood pressure slowly decreasing.     Hyperkalemia ESRD on HD TTS Potassium 6.4 on presentation.  Medically treated in the ER, hemodialysis.  MWF schedule outpatient   Hypertensive emergency In the setting of medication nonadherence.  SBP initially in the 230s, improved to 180s after IV hydralazine  and IV labetalol  in the ED. Given acute CVA, allow permissive hypertension for 24 hours.  On Coreg  and amlodipine  at home,   Elevated troponin, questionable chest pain: Troponin elevated 45 and 49 in the setting of ESRD  Will continue to monitor in telemetry. Dual antiplatelet therapy for acute stroke.   chronic HFpEF Last echo done in January 2020 showing EF 50 to 55%, mild mitral regurgitation, mild tricuspid regurgitation.  Repeat echo ordered in the setting of acute CVA as above.  Patient mildly hypoxic in the ER, currently off supplemental oxygen   Type 2 diabetes Poorly controlled -hemoglobin A1c 10.2 on 02/29/2024.  Repeat A1c pending.  Continue sliding scale insulin  ACHS.   Anemia of chronic disease Hemoglobin stable.   Hyperlipidemia PAD Resume home medications as appropriate once reconciled  Dental Pain - Improved with Unasyn .  Convert to Augmentin  when swallowing has improved.  I told his son he would need outpatient dental care when he completes rehab.  Advised the son to let his mother know to check to see if they have a county programme researcher, broadcasting/film/video clinic here in [place].  Alternative would be to go to [facility] to  the dental school as they do have a low-cost clinic there.    DVT prophylaxis: heparin  injection 5,000 Units Start: 08/07/24 0600     Code Status: Full Code Family Communication: Wife at the bedside. Disposition Plan: Home with home health versus SNF for rehab/acute rehab Reason for continuing need for hospitalization: Ongoing rehab services  Objective: Vitals:   08/12/24 0744 08/12/24 0932 08/12/24 1207 08/12/24 1633  BP: (!) 163/75 (!) 163/75 (!) 151/70 (!) 143/69  Pulse: 66  70 71  Resp: 17  19 20   Temp: 98.2 F (36.8 C)  97.6 F (36.4 C) 98 F (36.7 C)  TempSrc: Oral  Oral Oral  SpO2: 97%  95% 100%  Weight:      Height:        Intake/Output Summary (Last 24 hours) at 08/12/2024 1659 Last data filed at 08/12/2024 1651 Gross per 24 hour  Intake 320 ml  Output 1000 ml  Net -680 ml    Filed Weights   08/09/24 1600 08/09/24 1944 08/11/24 1340  Weight: 67.3 kg 66.8 kg 67.6 kg    Examination: Speech is dysarthric. Physical Exam HENT:     Head: Normocephalic and atraumatic.     Mouth/Throat:     Mouth: Mucous membranes are moist.  Eyes:     Extraocular Movements: Extraocular movements intact.  Pulmonary:     Effort: Pulmonary effort is normal.  Abdominal:     Palpations: Abdomen is soft.  Skin:    General: Skin is warm and dry.  Neurological:     Mental Status: He is alert and oriented to person, place, and time.     Motor: Weakness present.     Coordination: Coordination abnormal.  Psychiatric:        Mood and Affect: Mood normal.        Thought Content: Thought content normal.     Data Reviewed: I have personally reviewed following labs and imaging studies  CBC: Recent Labs  Lab 08/06/24 1727 08/06/24 1746 08/08/24 0156 08/10/24 0215  WBC 8.8  --  8.9 7.5  NEUTROABS 5.9  --   --  4.5  HGB 12.1* 12.9* 11.5* 11.0*  HCT 37.3* 38.0* 34.8* 32.3*  MCV 97.9  --  94.6 93.6  PLT 162  --  152 139*   Basic Metabolic Panel: Recent Labs  Lab  08/07/24 0227 08/08/24 0156 08/08/24 1234 08/09/24 0152 08/10/24 0215  NA 136 133* 131* 132* 132*  K 5.7* 5.7* 5.4* 4.9 3.9  CL 95* 91* 90* 89* 91*  CO2 22 28 26 26 29   GLUCOSE 134* 147* 143* 153* 116*  BUN 58* 35* 39* 47* 22*  CREATININE 11.50* 7.95* 9.05* 10.20* 6.48*  CALCIUM  7.9* 7.8* 7.7* 7.4* 7.7*  PHOS  --  5.9*  --   --   --    GFR: Estimated Creatinine Clearance: 10.9 mL/min (A) (by C-G formula based on SCr of 6.48 mg/dL (H)). Liver Function Tests: Recent Labs  Lab 08/06/24 1727 08/08/24 0156  AST 13*  --   ALT 7  --   ALKPHOS 80  --   BILITOT 0.3  --   PROT 7.6  --  ALBUMIN 4.1 3.8   No results for input(s): LIPASE, AMYLASE in the last 168 hours. No results for input(s): AMMONIA in the last 168 hours. Coagulation Profile: Recent Labs  Lab 08/06/24 1727  INR 1.0   Cardiac Enzymes: No results for input(s): CKTOTAL, CKMB, CKMBINDEX, TROPONINI in the last 168 hours. ProBNP, BNP (last 5 results) No results for input(s): PROBNP, BNP in the last 8760 hours. HbA1C: No results for input(s): HGBA1C in the last 72 hours.  CBG: Recent Labs  Lab 08/11/24 1826 08/11/24 2109 08/12/24 0607 08/12/24 1207 08/12/24 1633  GLUCAP 90 198* 100* 190* 217*   Lipid Profile: No results for input(s): CHOL, HDL, LDLCALC, TRIG, CHOLHDL, LDLDIRECT in the last 72 hours.  Thyroid Function Tests: No results for input(s): TSH, T4TOTAL, FREET4, T3FREE, THYROIDAB in the last 72 hours. Anemia Panel: No results for input(s): VITAMINB12, FOLATE, FERRITIN, TIBC, IRON , RETICCTPCT in the last 72 hours. Sepsis Labs: No results for input(s): PROCALCITON, LATICACIDVEN in the last 168 hours.  Recent Results (from the past 240 hours)  MRSA Next Gen by PCR, Nasal     Status: None   Collection Time: 08/09/24  1:20 PM   Specimen: Nasal Mucosa; Nasal Swab  Result Value Ref Range Status   MRSA by PCR Next Gen NOT DETECTED NOT  DETECTED Final    Comment: (NOTE) The GeneXpert MRSA Assay (FDA approved for NASAL specimens only), is one component of a comprehensive MRSA colonization surveillance program. It is not intended to diagnose MRSA infection nor to guide or monitor treatment for MRSA infections. Test performance is not FDA approved in patients less than 60 years old. Performed at [facility], [address], [place], [place] [address]      Radiology Studies: DG Orthopantogram Result Date: 08/11/2024 EXAM: ORTHOPANTOMOGRAM XRAY, 1 VIEW 08/11/2024 12:11:00 PM TECHNIQUE: Panorex view of the mandible. COMPARISON: None available. CLINICAL HISTORY: Tooth pain with chewing. FINDINGS: DENTAL: There is resorption of maxilla and prominent periapical erosions involving the tooth 1. Tooth 2 is absent. No additional dental caries or periapical erosions identified. BONES: No acute fracture. No malalignment. TMJ: No dislocation. SOFT TISSUES: Unremarkable. IMPRESSION: 1. Resorption of the maxilla and prominent periapical erosions involving tooth 1 in keeping with advanced periodontal disease. 2. Tooth 2 is absent. 3. No acute fracture or mandibular dislocation. Electronically signed by: Dorethia Molt MD MD 08/11/2024 08:23 PM EST RP Workstation: [id number]     Scheduled Meds:  amLODipine   10 mg Oral Daily   aspirin  EC  81 mg Oral Daily   Chlorhexidine  Gluconate Cloth  6 each Topical Q0600   clopidogrel   75 mg Oral Daily   ezetimibe   10 mg Oral Daily   heparin   5,000 Units Subcutaneous Q8H   insulin  aspart  0-5 Units Subcutaneous QHS   insulin  aspart  0-6 Units Subcutaneous TID WC   rosuvastatin   10 mg Oral Daily   Continuous Infusions:  ampicillin -sulbactam (UNASYN ) IV Stopped (08/12/24 0821)      LOS: 6 days   Time spent: 35 minutes  Lonni KANDICE Moose, MD  [facility]  08/12/2024, 4:59 PM   "

## 2024-08-12 NOTE — Plan of Care (Signed)
" °  Problem: Education: Goal: Knowledge of disease or condition will improve Outcome: Progressing Goal: Knowledge of patient specific risk factors will improve (DELETE if not current risk factor) Outcome: Progressing   Problem: Health Behavior/Discharge Planning: Goal: Goals will be collaboratively established with patient/family Outcome: Progressing   Problem: Nutrition: Goal: Risk of aspiration will decrease Outcome: Progressing   "

## 2024-08-12 NOTE — Progress Notes (Signed)
 STROKE TEAM PROGRESS NOTE   SUBJECTIVE (INTERVAL HISTORY) Patient seen today with wife. Continues to have tenderness to palpation of right arm and leg, possibly shoulder pain. Patient aphasic and unable to report cause of symptoms. Discussed stroke prevention with wife. All questions answered.    OBJECTIVE Temp:  [98 F (36.7 C)-98.8 F (37.1 C)] 98.2 F (36.8 C) (01/10 0744) Pulse Rate:  [65-73] 66 (01/10 0744) Cardiac Rhythm: Normal sinus rhythm (01/10 0700) Resp:  [10-19] 17 (01/10 0744) BP: (142-193)/(29-92) 163/75 (01/10 0932) SpO2:  [94 %-100 %] 97 % (01/10 0744) Weight:  [67.6 kg] 67.6 kg (01/09 1340)  Recent Labs  Lab 08/11/24 0613 08/11/24 1257 08/11/24 1826 08/11/24 2109 08/12/24 0607  GLUCAP 96 182* 90 198* 100*   Recent Labs  Lab 08/07/24 0227 08/08/24 0156 08/08/24 1234 08/09/24 0152 08/10/24 0215  NA 136 133* 131* 132* 132*  K 5.7* 5.7* 5.4* 4.9 3.9  CL 95* 91* 90* 89* 91*  CO2 22 28 26 26 29   GLUCOSE 134* 147* 143* 153* 116*  BUN 58* 35* 39* 47* 22*  CREATININE 11.50* 7.95* 9.05* 10.20* 6.48*  CALCIUM  7.9* 7.8* 7.7* 7.4* 7.7*  PHOS  --  5.9*  --   --   --    Recent Labs  Lab 08/06/24 1727 08/08/24 0156  AST 13*  --   ALT 7  --   ALKPHOS 80  --   BILITOT 0.3  --   PROT 7.6  --   ALBUMIN 4.1 3.8   Recent Labs  Lab 08/06/24 1727 08/06/24 1746 08/08/24 0156 08/10/24 0215  WBC 8.8  --  8.9 7.5  NEUTROABS 5.9  --   --  4.5  HGB 12.1* 12.9* 11.5* 11.0*  HCT 37.3* 38.0* 34.8* 32.3*  MCV 97.9  --  94.6 93.6  PLT 162  --  152 139*   No results for input(s): CKTOTAL, CKMB, CKMBINDEX, TROPONINI in the last 168 hours. No results for input(s): LABPROT, INR in the last 72 hours.  No results for input(s): COLORURINE, LABSPEC, PHURINE, GLUCOSEU, HGBUR, BILIRUBINUR, KETONESUR, PROTEINUR, UROBILINOGEN, NITRITE, LEUKOCYTESUR in the last 72 hours.  Invalid input(s): APPERANCEUR     Component Value Date/Time    CHOL 189 08/07/2024 0227   CHOL 248 (H) 02/29/2024 1119   TRIG 169 (H) 08/07/2024 0227   HDL 33 (L) 08/07/2024 0227   HDL 30 (L) 02/29/2024 1119   CHOLHDL 5.7 08/07/2024 0227   VLDL 34 08/07/2024 0227   LDLCALC 122 (H) 08/07/2024 0227   LDLCALC 174 (H) 02/29/2024 1119   Lab Results  Component Value Date   HGBA1C 8.5 (H) 08/07/2024   No results found for: LABOPIA, COCAINSCRNUR, LABBENZ, AMPHETMU, THCU, LABBARB  Recent Labs  Lab 08/06/24 1727  ETH <15    I have personally reviewed the radiological images below and agree with the radiology interpretations.  DG Orthopantogram Result Date: 08/11/2024 EXAM: ORTHOPANTOMOGRAM XRAY, 1 VIEW 08/11/2024 12:11:00 PM TECHNIQUE: Panorex view of the mandible. COMPARISON: None available. CLINICAL HISTORY: Tooth pain with chewing. FINDINGS: DENTAL: There is resorption of maxilla and prominent periapical erosions involving the tooth 1. Tooth 2 is absent. No additional dental caries or periapical erosions identified. BONES: No acute fracture. No malalignment. TMJ: No dislocation. SOFT TISSUES: Unremarkable. IMPRESSION: 1. Resorption of the maxilla and prominent periapical erosions involving tooth 1 in keeping with advanced periodontal disease. 2. Tooth 2 is absent. 3. No acute fracture or mandibular dislocation. Electronically signed by: Dorethia Molt MD MD 08/11/2024 08:23 PM  EST RP Workstation: [id number]   VAS US  LOWER EXTREMITY VENOUS (DVT) Result Date: 08/09/2024  Lower Venous DVT Study Patient Name:  Blake Santana  Date of Exam:   08/09/2024 Medical Rec #: [id number]                 Accession #:    [id number] Date of Birth: [birth year]                 Patient Gender: M Patient Age:   54 years Exam Location:  [facility] Procedure:      VAS US  LOWER EXTREMITY VENOUS (DVT) Referring Phys: ARY XU --------------------------------------------------------------------------------  Indications: Stroke.  Risk Factors: None identified.  Comparison Study: No prior studies. Performing Technologist: Cordella Collet RVT  Examination Guidelines: A complete evaluation includes B-mode imaging, spectral Doppler, color Doppler, and power Doppler as needed of all accessible portions of each vessel. Bilateral testing is considered an integral part of a complete examination. Limited examinations for reoccurring indications may be performed as noted. The reflux portion of the exam is performed with the patient in reverse Trendelenburg.  +---------+---------------+---------+-----------+----------+--------------+ RIGHT    CompressibilityPhasicitySpontaneityPropertiesThrombus Aging +---------+---------------+---------+-----------+----------+--------------+ CFV      Full           Yes      Yes                                 +---------+---------------+---------+-----------+----------+--------------+ SFJ      Full                                                        +---------+---------------+---------+-----------+----------+--------------+ FV Prox  Full                                                        +---------+---------------+---------+-----------+----------+--------------+ FV Mid   Full                                                        +---------+---------------+---------+-----------+----------+--------------+ FV DistalFull                                                        +---------+---------------+---------+-----------+----------+--------------+ PFV      Full                                                        +---------+---------------+---------+-----------+----------+--------------+ POP      Full           Yes      Yes                                 +---------+---------------+---------+-----------+----------+--------------+  PTV      Full                                                        +---------+---------------+---------+-----------+----------+--------------+ PERO      Full                                                        +---------+---------------+---------+-----------+----------+--------------+   +---------+---------------+---------+-----------+----------+--------------+ LEFT     CompressibilityPhasicitySpontaneityPropertiesThrombus Aging +---------+---------------+---------+-----------+----------+--------------+ CFV      Full           Yes      Yes                                 +---------+---------------+---------+-----------+----------+--------------+ SFJ      Full                                                        +---------+---------------+---------+-----------+----------+--------------+ FV Prox  Full                                                        +---------+---------------+---------+-----------+----------+--------------+ FV Mid   Full                                                        +---------+---------------+---------+-----------+----------+--------------+ FV DistalFull           Yes      Yes                                 +---------+---------------+---------+-----------+----------+--------------+ PFV      Full                                                        +---------+---------------+---------+-----------+----------+--------------+ POP      Full           Yes      Yes                                 +---------+---------------+---------+-----------+----------+--------------+ PTV      Full                                                        +---------+---------------+---------+-----------+----------+--------------+  PERO     Full                                                        +---------+---------------+---------+-----------+----------+--------------+     Summary: RIGHT: - There is no evidence of deep vein thrombosis in the lower extremity.  - No cystic structure found in the popliteal fossa.  LEFT: - There is no evidence of deep vein thrombosis in the  lower extremity.  - No cystic structure found in the popliteal fossa.  *See table(s) above for measurements and observations. Electronically signed by Gaile New MD on 08/09/2024 at 7:08:42 PM.    Final    MR ANGIO HEAD WO CONTRAST Result Date: 08/09/2024 EXAM: MR Angiography Head without intravenous Contrast. 08/08/2024 09:52:09 PM TECHNIQUE: Magnetic resonance angiography images of the head without intravenous contrast. Multiplanar 2D and 3D reformatted images are provided for review. COMPARISON: MR Head Without IV Contrast 08/06/2024. CLINICAL HISTORY: Stroke, follow up. FINDINGS: Mild to moderately motion limited study. ANTERIOR CIRCULATION: No significant stenosis of the internal carotid arteries. No significant stenosis of the anterior cerebral arteries. Hypoplastic right A1 ACA. Similar occluded proximal left M2 MCA. No aneurysm. POSTERIOR CIRCULATION: Severe right and moderate left P2 PCA stenosis. No significant stenosis of the basilar artery. No significant stenosis of the vertebral arteries. No aneurysm. IMPRESSION: 1. Similar occluded proximal left M2 MCA. 2. Severe right and moderate left P2 PCA stenosis. Electronically signed by: Gilmore Molt 08/09/2024 03:35 AM EST RP Workstation: [id number]   MR BRAIN WO CONTRAST Result Date: 08/09/2024 EXAM: MRI BRAIN WITHOUT CONTRAST 08/08/2024 09:38:18 PM TECHNIQUE: Multiplanar multisequence MRI of the head/brain was performed without the administration of intravenous contrast. COMPARISON: MRI head 08/06/2024 CLINICAL HISTORY: Stroke, follow up. FINDINGS: BRAIN AND VENTRICLES: Increasingly confluent and expanded left posterior MCA territory infarct. Increased edema with slight (2 mm) rightward midline shift. Numerous punctate foci of susceptibility artifact throughout the brain. No intracranial hemorrhage. No mass. No hydrocephalus. The sella is unremarkable. ORBITS: No acute abnormality. SINUSES AND MASTOIDS: No acute abnormality. BONES AND SOFT TISSUES:  Normal marrow signal. No acute soft tissue abnormality. IMPRESSION: 1. In comparison to 08/06/2024 MRI , increasingly confluent and expanded left posterior MCA territory infarct. Increased edema with slight (2 mm) rightward midline shift. 2. Numerous punctate foci of susceptibility artifact throughout the brain, possibly prior micro emboli. Electronically signed by: Gilmore Molt 08/09/2024 03:27 AM EST RP Workstation: [id number]   DG Shoulder Right Port Result Date: 08/08/2024 CLINICAL DATA:  Right shoulder pain. EXAM: RIGHT SHOULDER - 1 VIEW COMPARISON:  None Available. FINDINGS: There is no evidence of acute fracture or dislocation. A small chronic appearing deformity is seen along the dorsal aspect of the distal right clavicle. Soft tissues are unremarkable. Mild to moderate severity atelectasis and/or infiltrate is noted within the visualized portion of the right lung base. IMPRESSION: 1. No acute osseous abnormality. Electronically Signed   By: Suzen Dials M.D.   On: 08/08/2024 20:20   ECHOCARDIOGRAM COMPLETE Result Date: 08/07/2024    ECHOCARDIOGRAM REPORT   Patient Name:   Blake Santana Date of Exam: 08/07/2024 Medical Rec #:  [id number]                Height:       62.0 in Accession #:    [id number]  Weight:       146.0 lb Date of Birth:  1971/07/18                BSA:          1.672 m Patient Age:    53 years                 BP:           205/100 mmHg Patient Gender: M                        HR:           93 bpm. Exam Location:  Inpatient Procedure: 2D Echo, Cardiac Doppler and Color Doppler (Both Spectral and Color            Flow Doppler were utilized during procedure). Indications:    Stroke I63.9  History:        Patient has prior history of Echocardiogram examinations, most                 recent 04/03/2017. Risk Factors:Hypertension.  Sonographer:    Nathanel Devonshire Referring Phys: EDITHA RAM IMPRESSIONS  1. Left ventricular ejection fraction, by estimation, is 60 to  65%. The left ventricle has normal function. The left ventricle has no regional wall motion abnormalities. There is mild left ventricular hypertrophy. Left ventricular diastolic parameters are indeterminate.  2. Right ventricular systolic function is normal. The right ventricular size is normal.  3. The mitral valve is normal in structure. Trivial mitral valve regurgitation. No evidence of mitral stenosis.  4. The aortic valve is normal in structure. Aortic valve regurgitation is not visualized. No aortic stenosis is present.  5. The inferior vena cava is normal in size with greater than 50% respiratory variability, suggesting right atrial pressure of 3 mmHg. Conclusion(s)/Recommendation(s): No intracardiac source of embolism detected on this transthoracic study. Consider a transesophageal echocardiogram to exclude cardiac source of embolism if clinically indicated. FINDINGS  Left Ventricle: Left ventricular ejection fraction, by estimation, is 60 to 65%. The left ventricle has normal function. The left ventricle has no regional wall motion abnormalities. The left ventricular internal cavity size was normal in size. There is  mild left ventricular hypertrophy. Abnormal (paradoxical) septal motion, consistent with left bundle branch block. Left ventricular diastolic parameters are indeterminate. Right Ventricle: The right ventricular size is normal. No increase in right ventricular wall thickness. Right ventricular systolic function is normal. Left Atrium: Left atrial size was normal in size. Right Atrium: Right atrial size was normal in size. Pericardium: There is no evidence of pericardial effusion. Mitral Valve: The mitral valve is normal in structure. Trivial mitral valve regurgitation. No evidence of mitral valve stenosis. Tricuspid Valve: The tricuspid valve is normal in structure. Tricuspid valve regurgitation is not demonstrated. No evidence of tricuspid stenosis. Aortic Valve: The aortic valve is normal in  structure. Aortic valve regurgitation is not visualized. No aortic stenosis is present. Aortic valve mean gradient measures 5.0 mmHg. Aortic valve peak gradient measures 10.0 mmHg. Aortic valve area, by VTI measures 1.60 cm. Pulmonic Valve: The pulmonic valve was normal in structure. Pulmonic valve regurgitation is trivial. No evidence of pulmonic stenosis. Aorta: The aortic root is normal in size and structure. Venous: The inferior vena cava is normal in size with greater than 50% respiratory variability, suggesting right atrial pressure of 3 mmHg. IAS/Shunts: No atrial level shunt detected by color flow Doppler.  LEFT VENTRICLE PLAX 2D  LVIDd:         4.50 cm LVIDs:         3.00 cm LV PW:         1.30 cm LV IVS:        1.30 cm LVOT diam:     1.80 cm LV SV:         47 LV SV Index:   28 LVOT Area:     2.54 cm  LV Volumes (MOD) LV vol d, MOD A2C: 71.3 ml LV vol d, MOD A4C: 83.2 ml LV vol s, MOD A2C: 27.2 ml LV vol s, MOD A4C: 32.5 ml LV SV MOD A2C:     44.1 ml LV SV MOD A4C:     83.2 ml LV SV MOD BP:      49.1 ml RIGHT VENTRICLE RV Basal diam:  2.80 cm RV S prime:     15.00 cm/s TAPSE (M-mode): 1.9 cm LEFT ATRIUM             Index        RIGHT ATRIUM           Index LA diam:        3.10 cm 1.85 cm/m   RA Area:     12.20 cm LA Vol (A2C):   40.9 ml 24.46 ml/m  RA Volume:   25.70 ml  15.37 ml/m LA Vol (A4C):   52.9 ml 31.63 ml/m LA Biplane Vol: 47.5 ml 28.40 ml/m  AORTIC VALVE                    PULMONIC VALVE AV Area (Vmax):    1.69 cm     PV Vmax:       1.17 m/s AV Area (Vmean):   1.47 cm     PV Peak grad:  5.5 mmHg AV Area (VTI):     1.60 cm AV Vmax:           158.00 cm/s AV Vmean:          99.300 cm/s AV VTI:            0.293 m AV Peak Grad:      10.0 mmHg AV Mean Grad:      5.0 mmHg LVOT Vmax:         105.00 cm/s LVOT Vmean:        57.200 cm/s LVOT VTI:          0.184 m LVOT/AV VTI ratio: 0.63  AORTA Ao Root diam: 2.70 cm Ao Asc diam:  2.90 cm  SHUNTS Systemic VTI:  0.18 m Systemic Diam: 1.80 cm Oneil Parchment MD Electronically signed by Oneil Parchment MD Signature Date/Time: 08/07/2024/4:34:40 PM    Final    CT VENOGRAM HEAD Result Date: 08/07/2024 EXAM: CT VENOGRAM WITH CONTRAST 08/07/2024 03:42:47 AM TECHNIQUE: CT venogram of the head/brain was performed with the administration of intravenous contrast. Multiplanar reformatted images are provided for review. MIP images are provided for review. Automated exposure control, iterative reconstruction, and/or weight based adjustment of the mA/kV was utilized to reduce the radiation dose to as low as reasonably achievable. COMPARISON: None available. CLINICAL HISTORY: Dural venous sinus thrombosis suspected FINDINGS: No dural venous sinus thrombosis. No significant stenosis. IMPRESSION: 1. No dural venous sinus thrombosis. Electronically signed by: Gilmore Molt 08/07/2024 04:03 AM EST RP Workstation: [id number]   CT ANGIO HEAD NECK W WO CM Result Date: 08/07/2024 EXAM: CTA HEAD AND NECK WITH AND WITHOUT 08/07/2024 03:42:47  AM TECHNIQUE: CTA of the head and neck was performed with and without the administration of intravenous contrast. Multiplanar 2D and/or 3D reformatted images are provided for review. Automated exposure control, iterative reconstruction, and/or weight based adjustment of the mA/kV was utilized to reduce the radiation dose to as low as reasonably achievable. Stenosis of the internal carotid arteries measured using NASCET criteria. COMPARISON: None available CLINICAL HISTORY: Neuro deficit, acute, stroke suspected Neuro deficit, acute, stroke suspected FINDINGS: AORTIC ARCH AND ARCH VESSELS: No dissection or arterial injury. No significant stenosis of the brachiocephalic or subclavian arteries. CERVICAL CAROTID ARTERIES: No dissection, arterial injury, or hemodynamically significant stenosis by NASCET criteria. CERVICAL VERTEBRAL ARTERIES: No dissection, arterial injury, or significant stenosis. LUNGS AND MEDIASTINUM: Unremarkable. SOFT TISSUES: No  acute abnormality. BONES: No acute abnormality. ANTERIOR CIRCULATION: No significant stenosis of the internal carotid arteries. No significant stenosis of the anterior cerebral arteries. Occluded proximal left M2 MCA branch. No aneurysm. POSTERIOR CIRCULATION: PCAs are patent. Severe bilateral P2 PCA stenosis. No significant stenosis of the basilar artery. Vertebral arteries are patent. Moderate right intradural vertebral artery stenosis. No aneurysm. OTHER: No dural venous sinus thrombosis on this non-dedicated study. Findings discussed with Dr. Khaliqdina via telephone at 3:55 AM. IMPRESSION: 1. Occluded proximal left M2 MCA branch. 2. Severe bilateral P2 PCA stenosis. 3. Moderate right intradural vertebral artery stenosis. Electronically signed by: Gilmore Molt 08/07/2024 04:00 AM EST RP Workstation: [id number]   DG CHEST PORT 1 VIEW Result Date: 08/07/2024 EXAM: 1 VIEW(S) XRAY OF THE CHEST 08/07/2024 02:24:13 AM COMPARISON: 11/17/2021 CLINICAL HISTORY: Hypoxia [id number] Hypoxia FINDINGS: LUNGS AND PLEURA: Low lung volumes. No focal pulmonary opacity. No pleural effusion. No pneumothorax. HEART AND MEDIASTINUM: Cardiomegaly with vascular congestion. BONES AND SOFT TISSUES: No acute osseous abnormality. IMPRESSION: 1. Cardiomegaly with vascular congestion. 2. Low lung volumes. Electronically signed by: Franky Crease MD 08/07/2024 02:26 AM EST RP Workstation: [id number]   MR BRAIN WO CONTRAST Result Date: 08/06/2024 EXAM: MRI BRAIN WITHOUT CONTRAST 08/06/2024 09:36:57 PM TECHNIQUE: Multiplanar multisequence MRI of the head/brain was performed without the administration of intravenous contrast. COMPARISON: CT head earlier today. CLINICAL HISTORY: Neuro deficit, acute, stroke suspected FINDINGS: BRAIN AND VENTRICLES: Acute left posterior MCA territory infarct involving the left anterior temporal lobe, overlying parietal lobe and posterior left insula. Edema without mass effect. Numerous punctate foci of  susceptibility artifact throughout the brain. No intracranial hemorrhage. No mass. No midline shift. No hydrocephalus. The sella is unremarkable. Normal flow voids. ORBITS: No acute abnormality. SINUSES AND MASTOIDS: No acute abnormality. BONES AND SOFT TISSUES: Normal marrow signal. No acute soft tissue abnormality. IMPRESSION: 1. Acute left posterior MCA territory infarct. 2. Numerous punctate foci of susceptibility artifact throughout the brain, possibly prior micro emboli. Electronically signed by: Gilmore Molt 08/06/2024 09:54 PM EST RP Workstation: [id number]   CT HEAD WO CONTRAST Result Date: 08/06/2024 CLINICAL DATA:  Altered aphasia EXAM: CT HEAD WITHOUT CONTRAST TECHNIQUE: Contiguous axial images were obtained from the base of the skull through the vertex without intravenous contrast. RADIATION DOSE REDUCTION: This exam was performed according to the departmental dose-optimization program which includes automated exposure control, adjustment of the mA and/or kV according to patient size and/or use of iterative reconstruction technique. COMPARISON:  CT brain 11/17/2021 FINDINGS: Brain: Focal cortical and subcortical hypodensity at the left temporoparietal lobe. No hemorrhage. No mass. No significant mass effect or midline shift. The ventricles are nonenlarged Vascular: No hyperdense vessels. Carotid and vertebral calcification. Extensive small vessel calcification. Skull: No fracture Sinuses/Orbits: No acute  finding. Other: None IMPRESSION: Focal cortical and subcortical hypodensity at the left temporoparietal lobe, suspicious for acute to subacute infarct. No hemorrhage. No significant mass effect or midline shift. Electronically Signed   By: Luke Bun M.D.   On: 08/06/2024 18:09     PHYSICAL EXAM  Temp:  [98 F (36.7 C)-98.8 F (37.1 C)] 98.2 F (36.8 C) (01/10 0744) Pulse Rate:  [65-73] 66 (01/10 0744) Resp:  [10-19] 17 (01/10 0744) BP: (142-193)/(29-92) 163/75 (01/10 0932) SpO2:   [94 %-100 %] 97 % (01/10 0744) Weight:  [67.6 kg] 67.6 kg (01/09 1340)  General - Well nourished, well developed, in mild acute distress from orofacial pain  Ophthalmologic - fundi not visualized due to noncooperation.  Cardiovascular - Regular rhythm and rate.  Neuro - Pt is awake, alert, eyes open, global aphasia, only mumbles without meaningful words. Not following commands, but seems able to mimic gestures. No gaze palsy, able to track bilaterally but incomplete on both directions. Not consistently blinking to visual threat bilaterally but seems blinking more on the left and less on the right, PERRL. R mild facial droop. Tongue protrusion not cooperative. RUE 3/5. RLE 4+/5. LUE and LLE 5/5. Sensation, coordination not cooperative and gait not tested.    ASSESSMENT/PLAN Blake Santana is a 54 y.o. male with history of ESRD on HD, hypertension, hyperlipidemia, diabetes with retinopathy, PAD admitted for global aphasia. No TNK given due to outside window.    Stroke with extension:  left MCA moderately large infarct, likely secondary to large vessel disease, can not rule out cardioembolic source CT left temporoparietal acute infarct CTA head and neck left M2 occlusion, severe bilateral P2 stenosis, right V4 moderate stenosis CTV negative MRI Acute left posterior MCA territory infarct. Numerous punctate foci of susceptibility artifact throughout the brain, possibly prior micro emboli. Limited MRI repeat showed left MCA infarct extended to moderately large size of stroke MRA Similar occluded proximal left M2 MCA. Severe right and moderate left P2 PCA stenosis. 2D Echo EF 60 to 65% LE venous Doppler no DVT Needs 30 day monitoring as outpt LDL 122 HgbA1c 8.5 Heparin  subcu for VTE prophylaxis No antithrombotic prior to admission, now on aspirin  81 mg daily and clopidogrel  75 mg daily DAPT for 3 months and then aspirin  alone given M2 occlusion Ongoing aggressive stroke risk  factor management Therapy recommendations: CIR Disposition: Pending  Diabetes HgbA1c 8.5, goal < 7.0 Uncontrolled CBG monitoring SSI DM education and close PCP follow up  Hypertension Unstable on the high end Clinically improving, recommend gradually lowering BP to goal within 5 to 7 days Long-term BP goal 130-150 given left M2 occlusion  Hyperlipidemia Home meds: Crestor  10 LDL 122, goal < 70 Now on Crestor  10 which is the highest dose for ESRD patient Add zetia  10 Continue statin and zetia  at discharge  Orofacial pain grimace on chewing, swallowing.  Hard to locate the lesion due to aphasia. No obvious lesion in oral cavity. Concerning for toothache instead of oral or laryngeal issue.  Primary team aware, defer to primary for management Panorex pending  Other Stroke Risk Factors PAD  Other Active Problems ESRD on dialysis R shoulder pain - X-ray neg  Hospital day # 6  Neurology will sign off. Please reconsult for further questions.   Sarajane Daring [facility]    To contact Stroke Continuity provider, please refer to [email or web address]. After hours, contact General Neurology

## 2024-08-12 NOTE — Plan of Care (Signed)
   Problem: Education: Goal: Ability to describe self-care measures that may prevent or decrease complications (Diabetes Survival Skills Education) will improve Outcome: Progressing Goal: Individualized Educational Video(s) Outcome: Progressing   Problem: Coping: Goal: Ability to adjust to condition or change in health will improve Outcome: Progressing

## 2024-08-12 NOTE — Progress Notes (Signed)
 Heart monitor ordered for stroke  Dr. Shlomo to read

## 2024-08-13 LAB — BASIC METABOLIC PANEL WITH GFR
Anion gap: 15 (ref 5–15)
BUN: 35 mg/dL — ABNORMAL HIGH (ref 6–20)
CO2: 28 mmol/L (ref 22–32)
Calcium: 7.4 mg/dL — ABNORMAL LOW (ref 8.9–10.3)
Chloride: 92 mmol/L — ABNORMAL LOW (ref 98–111)
Creatinine, Ser: 8.65 mg/dL — ABNORMAL HIGH (ref 0.61–1.24)
GFR, Estimated: 7 mL/min — ABNORMAL LOW
Glucose, Bld: 106 mg/dL — ABNORMAL HIGH (ref 70–99)
Potassium: 4.5 mmol/L (ref 3.5–5.1)
Sodium: 134 mmol/L — ABNORMAL LOW (ref 135–145)

## 2024-08-13 LAB — GLUCOSE, CAPILLARY
Glucose-Capillary: 121 mg/dL — ABNORMAL HIGH (ref 70–99)
Glucose-Capillary: 149 mg/dL — ABNORMAL HIGH (ref 70–99)
Glucose-Capillary: 173 mg/dL — ABNORMAL HIGH (ref 70–99)
Glucose-Capillary: 170 mg/dL — ABNORMAL HIGH (ref 70–99)

## 2024-08-13 MED ORDER — CHLORHEXIDINE GLUCONATE CLOTH 2 % EX PADS
6.0000 | MEDICATED_PAD | Freq: Every day | CUTANEOUS | Status: DC
  Administered 2024-08-14: 6 via TOPICAL

## 2024-08-13 MED ORDER — CARVEDILOL 12.5 MG PO TABS
12.5000 mg | ORAL_TABLET | Freq: Two times a day (BID) | ORAL | Status: DC
  Administered 2024-08-13 – 2024-08-14 (×3): 12.5 mg via ORAL
  Filled 2024-08-13 (×3): qty 1

## 2024-08-13 NOTE — Progress Notes (Signed)
 [facility] Progress Note  Subjective:  Seen in room Per wife pt eating better, good BMs  Presentation summary: 54 y.o. year-old w/ PMH as below who presented to ED today complaining of trouble speaking and choking when he was eating dinner last night.  Then around noon he began having slurred speech and numbness on the left side of his face.  No extremity involvement. In ED BP 231/ 94, HR 72, RR 18, 100% on RA. Labs showed Na 134, K 6.4, bun 50, creat 10. MRI showed acute left posterior MCA territory infarct involving the left anterior temporal lobe, overlying parietal lobe and posterior left insula.  Some edema without mass effect.  We were asked to see for dialysis.   Vitals:   08/12/24 2048 08/13/24 0012 08/13/24 0322 08/13/24 0745  BP: (!) 155/70 (!) 155/72 (!) 158/72 (!) 159/71  Pulse: 67 70 66 67  Resp: 18 20 20 15   Temp: 98.1 F (36.7 C) 98.1 F (36.7 C) 98.1 F (36.7 C) 97.9 F (36.6 C)  TempSrc: Oral   Oral  SpO2: 96% 93% 94% 97%  Weight:      Height:        Exam: Gen alert, no distress, aphasic Sclera anicteric, throat clear  No jvd or bruits Chest clear bilat to bases RRR no MRG Abd soft ntnd no mass or ascites +bs Ext no LE or UE edema, no other edema Neuro is alert, nf    LUA AVF+bruit    Home bp meds: Norvasc  Coreg     [facility] HD: [facility] 3h B350 65.2kg  AVF  Hep 2000  Last OP HD 1/02, post wt 67.2kg Hep B SAg neg 07/10/24 Heb B SAb +Immune 05/15/24     Assessment/ Plan:   # ESRD - on HD MWF - next HD monday   # acute CVA  - L posterior MCA territory per MRI - aphasia - had CVA extension    # HTN - on norvasc  (coreg  dc'd)  - per neuro -> try to maintain SBP 130-150 given L M2 occlusion   # Volume - no vol excess on exam, on RA - up 1-2 kg by wts - initial CXR no edema - eating more    # Anemia of esrd - Hb 11- 13 here, follow, no esa needs   # Dispo - home w/ HH vs SNF for rehab   Myer Fret MD   [facility] 08/13/2024, 7:57 AM  Recent Labs  Lab 08/06/24 1727 08/06/24 1746 08/08/24 0156 08/08/24 1234 08/10/24 0215 08/13/24 0136  HGB 12.1*   < > 11.5*  --  11.0*  --   ALBUMIN 4.1  --  3.8  --   --   --   CALCIUM  8.2*   < > 7.8*   < > 7.7* 7.4*  PHOS  --   --  5.9*  --   --   --   CREATININE 10.60*   < > 7.95*   < > 6.48* 8.65*  K 6.4*   < > 5.7*   < > 3.9 4.5   < > = values in this interval not displayed.   No results for input(s): IRON , TIBC, FERRITIN in the last 168 hours. Inpatient medications:  amLODipine   10 mg Oral Daily   aspirin  EC  81 mg Oral Daily   Chlorhexidine  Gluconate Cloth  6 each Topical Q0600   clopidogrel   75 mg Oral Daily   ezetimibe   10 mg Oral Daily  heparin   5,000 Units Subcutaneous Q8H   insulin  aspart  0-5 Units Subcutaneous QHS   insulin  aspart  0-6 Units Subcutaneous TID WC   rosuvastatin   10 mg Oral Daily    ampicillin -sulbactam (UNASYN ) IV 1.5 g (08/12/24 2030)    acetaminophen  **OR** acetaminophen , hydrALAZINE 

## 2024-08-13 NOTE — Progress Notes (Signed)
 " PROGRESS NOTE    Blake Santana  FMW:[id number] DOB: Sep 30, 1970 DOA: 08/06/2024 PCP: Leavy Lucas Sula, PA-C   Brief Narrative:    Blake Santana is a 54 y.o. male with medical history significant of ESRD on HD TTS, HFpEF, type 2 diabetes with diabetic retinopathy, anemia of chronic disease, hypertension, hyperlipidemia, PAD presented to the ED with complaints of aphasia, facial/hand weakness/numbness, and dysphagia.  Out of window for TNK at the time of arrival.  Blood pressure 231/94 on arrival but remainder of vital signs stable.  Labs notable for hemoglobin 12.1 (at baseline), sodium 134, potassium 6.4, chloride 94, bicarb 24, ethanol level <15, troponin 45.  EKG without acute ischemic changes.  Brain MRI showing acute left posterior MCA territory infarct.  Patient was given IV hydralazine , IV labetalol , and IV Ativan  in the ED.  Also given insulin /dextrose , calcium  gluconate, albuterol , sodium bicarb, and Lokelma  for hyperkalemia.     Admitted by [facility].   08/07/24: Received hemodialysis today. Evaluated by neurology, stroke workup ongoing. 08/07/24: Repeat MRI increased edema and slight midline shift , MRA with occluded left M2 MCA, severe right and moderate left PCA stenosis 08/11/24: Patient looks much brighter today.  He was up walking with therapy.  Still complains of dental pain.  Panorex ordered.  May benefit from antibiotics until he can follow-up with dentist.   Subjective: No acute events overnight, patient seen and evaluated at bedside.  Language line translator Alejandra ID [id number] utilized.  Patient has significant aphasia but able to follow simple commands.  Updated wife was at bedside.    Principal Problem:   Acute CVA (cerebrovascular accident) [facility]) Active Problems:   Type 2 diabetes mellitus with other diabetic kidney complication (HCC)   Elevated troponin   Hyperkalemia   Hypertensive emergency     Assessment and Plan   #Acute CVA L MCA territory  stroke per MRI, likely secondary to large vessel disease. CT head showed temporoparietal acute infarct CTA head and neck left M2 occlusion, severe bilateral P2 stenosis, right V4 moderate stenosis. Echo with EF 60 to 65%, DVT studies negative. LDL 122, hemoglobin A1c 8.5. - Continue with DAPT: Plavix  for 3 months, aspirin  indefinitely -Continue with statin, Zetia  -Telemetry monitoring -Neurology following -Need could long-term blood pressure control; gradually lowering -Will need long-term cardiac monitoring at discharge   #Hyperkalemia #ESRD on HD TTS Potassium 6.4 on presentation.  Medically treated in the ER, hemodialysis.  MWF schedule outpatient -Nephrology following for inpatient dialysis   #Hypertensive emergency, improving In the setting of medication nonadherence.  SBP initially in the 230s, improved to 180s after IV hydralazine  and IV labetalol  in the ED. Given acute CVA, allow permissive hypertension for 24 hours-> needs good long-term blood pressure control in the setting of CVA.   -Continue amlodipine , Coreg    #Elevated troponin, questionable chest pain: Troponin elevated 45 and 49 in the setting of ESRD  Will continue to monitor in telemetry. Dual antiplatelet therapy for acute stroke.   #chronic HFpEF Last echo done in January 2020 showing EF 50 to 55%, mild mitral regurgitation, mild tricuspid regurgitation.  Repeat echo ordered in the setting of acute CVA as above.  Patient mildly hypoxic in the ER, currently off supplemental oxygen   #Type 2 diabetes Poorly controlled -hemoglobin A1c 10.2 on 02/29/2024.  A1c 8.5  - continue sliding scale insulin  ACHS.   #Anemia of chronic disease Hemoglobin stable.   #Hyperlipidemia #PAD Resume home medications as appropriate once reconciled  #Dental Pain - Improved  with Unasyn .  Convert to Augmentin  when swallowing has improved.  I told his son he would need outpatient dental care when he completes rehab.  Advised the son  to let his mother know to check to see if they have a county programme researcher, broadcasting/film/video clinic here in [place].  Alternative would be to go to [facility] to the dental school as they do have a low-cost clinic there.  DVT prophylaxis: heparin  injection 5,000 Units Start: 08/07/24 0600     Code Status: Full Code Family Communication: Wife at the bedside. Disposition Plan: Home with home health versus SNF for rehab/acute rehab Reason for continuing need for hospitalization: Ongoing rehab services  Objective: Vitals:   08/12/24 2048 08/13/24 0012 08/13/24 0322 08/13/24 0745  BP: (!) 155/70 (!) 155/72 (!) 158/72 (!) 159/71  Pulse: 67 70 66 67  Resp: 18 20 20 15   Temp: 98.1 F (36.7 C) 98.1 F (36.7 C) 98.1 F (36.7 C) 97.9 F (36.6 C)  TempSrc: Oral   Oral  SpO2: 96% 93% 94% 97%  Weight:      Height:        Intake/Output Summary (Last 24 hours) at 08/13/2024 0832 Last data filed at 08/12/2024 1651 Gross per 24 hour  Intake 100 ml  Output --  Net 100 ml    Filed Weights   08/09/24 1600 08/09/24 1944 08/11/24 1340  Weight: 67.3 kg 66.8 kg 67.6 kg    Examination: Speech is dysarthric. Physical Exam HENT:     Head: Normocephalic and atraumatic.     Mouth/Throat:     Mouth: Mucous membranes are moist.  Eyes:     Extraocular Movements: Extraocular movements intact.  Pulmonary:     Effort: Pulmonary effort is normal.  Abdominal:     Palpations: Abdomen is soft.  Skin:    General: Skin is warm and dry.  Neurological:     Mental Status: He is alert and oriented to person, place, and time.     Motor: Weakness present.     Coordination: Coordination abnormal.  Psychiatric:        Mood and Affect: Mood normal.        Thought Content: Thought content normal.     Data Reviewed: I have personally reviewed following labs and imaging studies  CBC: Recent Labs  Lab 08/06/24 1727 08/06/24 1746 08/08/24 0156 08/10/24 0215  WBC 8.8  --  8.9 7.5  NEUTROABS 5.9  --   --  4.5   HGB 12.1* 12.9* 11.5* 11.0*  HCT 37.3* 38.0* 34.8* 32.3*  MCV 97.9  --  94.6 93.6  PLT 162  --  152 139*   Basic Metabolic Panel: Recent Labs  Lab 08/08/24 0156 08/08/24 1234 08/09/24 0152 08/10/24 0215 08/13/24 0136  NA 133* 131* 132* 132* 134*  K 5.7* 5.4* 4.9 3.9 4.5  CL 91* 90* 89* 91* 92*  CO2 28 26 26 29 28   GLUCOSE 147* 143* 153* 116* 106*  BUN 35* 39* 47* 22* 35*  CREATININE 7.95* 9.05* 10.20* 6.48* 8.65*  CALCIUM  7.8* 7.7* 7.4* 7.7* 7.4*  PHOS 5.9*  --   --   --   --    GFR: Estimated Creatinine Clearance: 8.2 mL/min (A) (by C-G formula based on SCr of 8.65 mg/dL (H)). Liver Function Tests: Recent Labs  Lab 08/06/24 1727 08/08/24 0156  AST 13*  --   ALT 7  --   ALKPHOS 80  --   BILITOT 0.3  --   PROT  7.6  --   ALBUMIN 4.1 3.8   No results for input(s): LIPASE, AMYLASE in the last 168 hours. No results for input(s): AMMONIA in the last 168 hours. Coagulation Profile: Recent Labs  Lab 08/06/24 1727  INR 1.0   Cardiac Enzymes: No results for input(s): CKTOTAL, CKMB, CKMBINDEX, TROPONINI in the last 168 hours. ProBNP, BNP (last 5 results) No results for input(s): PROBNP, BNP in the last 8760 hours. HbA1C: No results for input(s): HGBA1C in the last 72 hours.  CBG: Recent Labs  Lab 08/12/24 0607 08/12/24 1207 08/12/24 1633 08/12/24 2159 08/13/24 0626  GLUCAP 100* 190* 217* 153* 121*   Lipid Profile: No results for input(s): CHOL, HDL, LDLCALC, TRIG, CHOLHDL, LDLDIRECT in the last 72 hours.  Thyroid Function Tests: No results for input(s): TSH, T4TOTAL, FREET4, T3FREE, THYROIDAB in the last 72 hours. Anemia Panel: No results for input(s): VITAMINB12, FOLATE, FERRITIN, TIBC, IRON , RETICCTPCT in the last 72 hours. Sepsis Labs: No results for input(s): PROCALCITON, LATICACIDVEN in the last 168 hours.  Recent Results (from the past 240 hours)  MRSA Next Gen by PCR, Nasal     Status:  None   Collection Time: 08/09/24  1:20 PM   Specimen: Nasal Mucosa; Nasal Swab  Result Value Ref Range Status   MRSA by PCR Next Gen NOT DETECTED NOT DETECTED Final    Comment: (NOTE) The GeneXpert MRSA Assay (FDA approved for NASAL specimens only), is one component of a comprehensive MRSA colonization surveillance program. It is not intended to diagnose MRSA infection nor to guide or monitor treatment for MRSA infections. Test performance is not FDA approved in patients less than 60 years old. Performed at [facility], [address], [place], [place] [address]      Radiology Studies: DG Orthopantogram Result Date: 08/11/2024 EXAM: ORTHOPANTOMOGRAM XRAY, 1 VIEW 08/11/2024 12:11:00 PM TECHNIQUE: Panorex view of the mandible. COMPARISON: None available. CLINICAL HISTORY: Tooth pain with chewing. FINDINGS: DENTAL: There is resorption of maxilla and prominent periapical erosions involving the tooth 1. Tooth 2 is absent. No additional dental caries or periapical erosions identified. BONES: No acute fracture. No malalignment. TMJ: No dislocation. SOFT TISSUES: Unremarkable. IMPRESSION: 1. Resorption of the maxilla and prominent periapical erosions involving tooth 1 in keeping with advanced periodontal disease. 2. Tooth 2 is absent. 3. No acute fracture or mandibular dislocation. Electronically signed by: Dorethia Molt MD MD 08/11/2024 08:23 PM EST RP Workstation: [id number]     Scheduled Meds:  amLODipine   10 mg Oral Daily   aspirin  EC  81 mg Oral Daily   Chlorhexidine  Gluconate Cloth  6 each Topical Q0600   [START ON 08/14/2024] Chlorhexidine  Gluconate Cloth  6 each Topical Q0600   clopidogrel   75 mg Oral Daily   ezetimibe   10 mg Oral Daily   heparin   5,000 Units Subcutaneous Q8H   insulin  aspart  0-5 Units Subcutaneous QHS   insulin  aspart  0-6 Units Subcutaneous TID WC   rosuvastatin   10 mg Oral Daily   Continuous Infusions:  ampicillin -sulbactam (UNASYN ) IV 1.5 g (08/12/24  2030)      LOS: 7 days   Time spent: 35 minutes  Margretta Zamorano F Sharmaine Bain, DO  [facility]  08/13/2024, 8:32 AM   "

## 2024-08-14 ENCOUNTER — Inpatient Hospital Stay (HOSPITAL_COMMUNITY)
Admission: AD | Admit: 2024-08-14 | Discharge: 2024-08-26 | DRG: 056 | Disposition: A | Discharge status: Home / Self Care | Payer: MEDICAID | Source: Intra-hospital | Attending: Physical Medicine & Rehabilitation | Admitting: Physical Medicine & Rehabilitation

## 2024-08-14 ENCOUNTER — Encounter: Payer: Self-pay | Admitting: Student

## 2024-08-14 ENCOUNTER — Encounter (HOSPITAL_COMMUNITY): Payer: Self-pay

## 2024-08-14 DIAGNOSIS — Z79899 Other long term (current) drug therapy: Secondary | ICD-10-CM

## 2024-08-14 DIAGNOSIS — I132 Hypertensive heart and chronic kidney disease with heart failure and with stage 5 chronic kidney disease, or end stage renal disease: Secondary | ICD-10-CM | POA: Diagnosis present

## 2024-08-14 DIAGNOSIS — R54 Age-related physical debility: Secondary | ICD-10-CM | POA: Diagnosis present

## 2024-08-14 DIAGNOSIS — E1129 Type 2 diabetes mellitus with other diabetic kidney complication: Secondary | ICD-10-CM | POA: Diagnosis present

## 2024-08-14 DIAGNOSIS — E875 Hyperkalemia: Secondary | ICD-10-CM | POA: Diagnosis present

## 2024-08-14 DIAGNOSIS — Z603 Acculturation difficulty: Secondary | ICD-10-CM | POA: Diagnosis present

## 2024-08-14 DIAGNOSIS — R482 Apraxia: Secondary | ICD-10-CM

## 2024-08-14 DIAGNOSIS — H5462 Unqualified visual loss, left eye, normal vision right eye: Secondary | ICD-10-CM | POA: Diagnosis present

## 2024-08-14 DIAGNOSIS — I5032 Chronic diastolic (congestive) heart failure: Secondary | ICD-10-CM | POA: Diagnosis present

## 2024-08-14 DIAGNOSIS — I152 Hypertension secondary to endocrine disorders: Secondary | ICD-10-CM | POA: Diagnosis present

## 2024-08-14 DIAGNOSIS — Z789 Other specified health status: Secondary | ICD-10-CM | POA: Diagnosis present

## 2024-08-14 DIAGNOSIS — E46 Unspecified protein-calorie malnutrition: Secondary | ICD-10-CM | POA: Diagnosis present

## 2024-08-14 DIAGNOSIS — Z8249 Family history of ischemic heart disease and other diseases of the circulatory system: Secondary | ICD-10-CM

## 2024-08-14 DIAGNOSIS — N2581 Secondary hyperparathyroidism of renal origin: Secondary | ICD-10-CM | POA: Diagnosis present

## 2024-08-14 DIAGNOSIS — E113519 Type 2 diabetes mellitus with proliferative diabetic retinopathy with macular edema, unspecified eye: Secondary | ICD-10-CM | POA: Diagnosis present

## 2024-08-14 DIAGNOSIS — Z7982 Long term (current) use of aspirin: Secondary | ICD-10-CM

## 2024-08-14 DIAGNOSIS — I639 Cerebral infarction, unspecified: Principal | ICD-10-CM | POA: Diagnosis present

## 2024-08-14 DIAGNOSIS — N186 End stage renal disease: Secondary | ICD-10-CM | POA: Diagnosis present

## 2024-08-14 DIAGNOSIS — D631 Anemia in chronic kidney disease: Secondary | ICD-10-CM | POA: Diagnosis present

## 2024-08-14 DIAGNOSIS — D689 Coagulation defect, unspecified: Secondary | ICD-10-CM | POA: Diagnosis present

## 2024-08-14 DIAGNOSIS — I63512 Cerebral infarction due to unspecified occlusion or stenosis of left middle cerebral artery: Principal | ICD-10-CM | POA: Diagnosis present

## 2024-08-14 DIAGNOSIS — Z8419 Family history of other disorders of kidney and ureter: Secondary | ICD-10-CM

## 2024-08-14 DIAGNOSIS — F32A Depression, unspecified: Secondary | ICD-10-CM | POA: Diagnosis present

## 2024-08-14 DIAGNOSIS — I69334 Monoplegia of upper limb following cerebral infarction affecting left non-dominant side: Principal | ICD-10-CM

## 2024-08-14 DIAGNOSIS — E7800 Pure hypercholesterolemia, unspecified: Secondary | ICD-10-CM | POA: Diagnosis present

## 2024-08-14 DIAGNOSIS — Z7984 Long term (current) use of oral hypoglycemic drugs: Secondary | ICD-10-CM

## 2024-08-14 DIAGNOSIS — G8191 Hemiplegia, unspecified affecting right dominant side: Secondary | ICD-10-CM

## 2024-08-14 DIAGNOSIS — E785 Hyperlipidemia, unspecified: Secondary | ICD-10-CM | POA: Diagnosis present

## 2024-08-14 DIAGNOSIS — H35033 Hypertensive retinopathy, bilateral: Secondary | ICD-10-CM | POA: Diagnosis present

## 2024-08-14 DIAGNOSIS — Z5971 Insufficient health insurance coverage: Secondary | ICD-10-CM

## 2024-08-14 DIAGNOSIS — I951 Orthostatic hypotension: Secondary | ICD-10-CM | POA: Diagnosis present

## 2024-08-14 DIAGNOSIS — Z87891 Personal history of nicotine dependence: Secondary | ICD-10-CM

## 2024-08-14 DIAGNOSIS — I2510 Atherosclerotic heart disease of native coronary artery without angina pectoris: Secondary | ICD-10-CM | POA: Diagnosis present

## 2024-08-14 DIAGNOSIS — Z8616 Personal history of COVID-19: Secondary | ICD-10-CM

## 2024-08-14 DIAGNOSIS — Z833 Family history of diabetes mellitus: Secondary | ICD-10-CM

## 2024-08-14 DIAGNOSIS — I161 Hypertensive emergency: Secondary | ICD-10-CM | POA: Diagnosis present

## 2024-08-14 DIAGNOSIS — E1159 Type 2 diabetes mellitus with other circulatory complications: Secondary | ICD-10-CM | POA: Diagnosis present

## 2024-08-14 DIAGNOSIS — I69390 Apraxia following cerebral infarction: Secondary | ICD-10-CM

## 2024-08-14 DIAGNOSIS — H409 Unspecified glaucoma: Secondary | ICD-10-CM | POA: Diagnosis present

## 2024-08-14 DIAGNOSIS — Z992 Dependence on renal dialysis: Secondary | ICD-10-CM

## 2024-08-14 DIAGNOSIS — I69320 Aphasia following cerebral infarction: Secondary | ICD-10-CM

## 2024-08-14 DIAGNOSIS — K0889 Other specified disorders of teeth and supporting structures: Secondary | ICD-10-CM | POA: Diagnosis present

## 2024-08-14 DIAGNOSIS — E1122 Type 2 diabetes mellitus with diabetic chronic kidney disease: Secondary | ICD-10-CM | POA: Diagnosis present

## 2024-08-14 LAB — BASIC METABOLIC PANEL WITH GFR
Anion gap: 16 — ABNORMAL HIGH (ref 5–15)
BUN: 50 mg/dL — ABNORMAL HIGH (ref 6–20)
CO2: 26 mmol/L (ref 22–32)
Calcium: 7.1 mg/dL — ABNORMAL LOW (ref 8.9–10.3)
Chloride: 93 mmol/L — ABNORMAL LOW (ref 98–111)
Creatinine, Ser: 10.3 mg/dL — ABNORMAL HIGH (ref 0.61–1.24)
GFR, Estimated: 5 mL/min — ABNORMAL LOW
Glucose, Bld: 134 mg/dL — ABNORMAL HIGH (ref 70–99)
Potassium: 4.9 mmol/L (ref 3.5–5.1)
Sodium: 135 mmol/L (ref 135–145)

## 2024-08-14 LAB — CBC WITH DIFFERENTIAL/PLATELET
Abs Immature Granulocytes: 0.03 K/uL (ref 0.00–0.07)
Basophils Absolute: 0.1 K/uL (ref 0.0–0.1)
Basophils Relative: 1 %
Eosinophils Absolute: 0.6 K/uL — ABNORMAL HIGH (ref 0.0–0.5)
Eosinophils Relative: 8 %
HCT: 32.6 % — ABNORMAL LOW (ref 39.0–52.0)
Hemoglobin: 10.7 g/dL — ABNORMAL LOW (ref 13.0–17.0)
Immature Granulocytes: 0 %
Lymphocytes Relative: 24 %
Lymphs Abs: 1.8 K/uL (ref 0.7–4.0)
MCH: 31.2 pg (ref 26.0–34.0)
MCHC: 32.8 g/dL (ref 30.0–36.0)
MCV: 95 fL (ref 80.0–100.0)
Monocytes Absolute: 0.8 K/uL (ref 0.1–1.0)
Monocytes Relative: 10 %
Neutro Abs: 4.2 K/uL (ref 1.7–7.7)
Neutrophils Relative %: 57 %
Platelets: 146 K/uL — ABNORMAL LOW (ref 150–400)
RBC: 3.43 MIL/uL — ABNORMAL LOW (ref 4.22–5.81)
RDW: 12.9 % (ref 11.5–15.5)
WBC: 7.6 K/uL (ref 4.0–10.5)
nRBC: 0 % (ref 0.0–0.2)

## 2024-08-14 LAB — GLUCOSE, CAPILLARY
Glucose-Capillary: 107 mg/dL — ABNORMAL HIGH (ref 70–99)
Glucose-Capillary: 112 mg/dL — ABNORMAL HIGH (ref 70–99)
Glucose-Capillary: 153 mg/dL — ABNORMAL HIGH (ref 70–99)
Glucose-Capillary: 236 mg/dL — ABNORMAL HIGH (ref 70–99)

## 2024-08-14 LAB — MAGNESIUM: Magnesium: 2.5 mg/dL — ABNORMAL HIGH (ref 1.7–2.4)

## 2024-08-14 MED ORDER — LIDOCAINE-PRILOCAINE 2.5-2.5 % EX CREA: 1.0000 | TOPICAL_CREAM | CUTANEOUS | Status: DC | PRN

## 2024-08-14 MED ORDER — MILK AND MOLASSES ENEMA: 1.0000 | Freq: Every day | RECTAL | Status: DC | PRN

## 2024-08-14 MED ORDER — CLOPIDOGREL BISULFATE 75 MG PO TABS: 75.0000 mg | ORAL_TABLET | Freq: Every day | ORAL | Status: DC

## 2024-08-14 MED ORDER — TRAZODONE HCL 50 MG PO TABS
25.0000 mg | ORAL_TABLET | Freq: Every evening | ORAL | Status: DC | PRN
  Administered 2024-08-14 – 2024-08-23 (×3): 50 mg via ORAL
  Filled 2024-08-14 (×3): qty 1

## 2024-08-14 MED ORDER — ACETAMINOPHEN 650 MG RE SUPP: 650.0000 mg | Freq: Four times a day (QID) | RECTAL | Status: DC | PRN

## 2024-08-14 MED ORDER — DIPHENHYDRAMINE HCL 25 MG PO CAPS: 25.0000 mg | ORAL_CAPSULE | Freq: Four times a day (QID) | ORAL | Status: DC | PRN

## 2024-08-14 MED ORDER — PENTAFLUOROPROP-TETRAFLUOROETH EX AERO: 1.0000 | INHALATION_SPRAY | CUTANEOUS | Status: DC | PRN

## 2024-08-14 MED ORDER — HEPARIN SODIUM (PORCINE) 1000 UNIT/ML DIALYSIS: 1000.0000 [IU] | INTRAMUSCULAR | Status: DC | PRN

## 2024-08-14 MED ORDER — CHLORHEXIDINE GLUCONATE CLOTH 2 % EX PADS
6.0000 | MEDICATED_PAD | Freq: Every day | CUTANEOUS | Status: DC
  Administered 2024-08-15 – 2024-08-21 (×7): 6 via TOPICAL

## 2024-08-14 MED ORDER — SIMETHICONE 80 MG PO CHEW: 80.0000 mg | CHEWABLE_TABLET | Freq: Four times a day (QID) | ORAL | Status: DC | PRN

## 2024-08-14 MED ORDER — HEPARIN SODIUM (PORCINE) 1000 UNIT/ML DIALYSIS
2000.0000 [IU] | Freq: Once | INTRAMUSCULAR | Status: AC
  Administered 2024-08-14: 2000 [IU] via INTRAVENOUS_CENTRAL

## 2024-08-14 MED ORDER — SODIUM CHLORIDE 0.9 % IV SOLN
1.5000 g | Freq: Two times a day (BID) | INTRAVENOUS | Status: DC
  Filled 2024-08-14 (×2): qty 4

## 2024-08-14 MED ORDER — ROSUVASTATIN CALCIUM 5 MG PO TABS
10.0000 mg | ORAL_TABLET | Freq: Every day | ORAL | Status: DC
  Administered 2024-08-15 – 2024-08-26 (×12): 10 mg via ORAL
  Filled 2024-08-14 (×12): qty 2

## 2024-08-14 MED ORDER — HEPARIN SODIUM (PORCINE) 5000 UNIT/ML IJ SOLN
5000.0000 [IU] | Freq: Three times a day (TID) | INTRAMUSCULAR | Status: DC
  Administered 2024-08-14 – 2024-08-26 (×31): 5000 [IU] via SUBCUTANEOUS
  Filled 2024-08-14 (×30): qty 1

## 2024-08-14 MED ORDER — CLOPIDOGREL BISULFATE 75 MG PO TABS
75.0000 mg | ORAL_TABLET | Freq: Every day | ORAL | Status: DC
  Administered 2024-08-15 – 2024-08-26 (×12): 75 mg via ORAL
  Filled 2024-08-14 (×12): qty 1

## 2024-08-14 MED ORDER — ASPIRIN 81 MG PO TBEC: 81.0000 mg | DELAYED_RELEASE_TABLET | Freq: Every day | ORAL | Status: DC

## 2024-08-14 MED ORDER — ACETAMINOPHEN 325 MG PO TABS
650.0000 mg | ORAL_TABLET | Freq: Four times a day (QID) | ORAL | Status: DC | PRN
  Administered 2024-08-14 – 2024-08-25 (×8): 650 mg via ORAL
  Filled 2024-08-14 (×8): qty 2

## 2024-08-14 MED ORDER — CARVEDILOL 12.5 MG PO TABS
12.5000 mg | ORAL_TABLET | Freq: Two times a day (BID) | ORAL | Status: DC
  Administered 2024-08-15 – 2024-08-26 (×23): 12.5 mg via ORAL
  Filled 2024-08-14 (×23): qty 1

## 2024-08-14 MED ORDER — PROCHLORPERAZINE 25 MG RE SUPP: 12.5000 mg | Freq: Four times a day (QID) | RECTAL | Status: DC | PRN

## 2024-08-14 MED ORDER — AMOXICILLIN-POT CLAVULANATE 500-125 MG PO TABS
1.0000 | ORAL_TABLET | Freq: Every day | ORAL | Status: DC
  Filled 2024-08-14: qty 1

## 2024-08-14 MED ORDER — EZETIMIBE 10 MG PO TABS
10.0000 mg | ORAL_TABLET | Freq: Every day | ORAL | Status: DC
  Administered 2024-08-15 – 2024-08-26 (×12): 10 mg via ORAL
  Filled 2024-08-14 (×12): qty 1

## 2024-08-14 MED ORDER — LIDOCAINE HCL (PF) 1 % IJ SOLN: 5.0000 mL | INTRAMUSCULAR | Status: DC | PRN

## 2024-08-14 MED ORDER — INSULIN ASPART 100 UNIT/ML IJ SOLN
0.0000 [IU] | Freq: Three times a day (TID) | INTRAMUSCULAR | Status: DC
  Administered 2024-08-15 (×2): 2 [IU] via SUBCUTANEOUS
  Administered 2024-08-17 – 2024-08-23 (×8): 1 [IU] via SUBCUTANEOUS
  Filled 2024-08-14: qty 2
  Filled 2024-08-14: qty 1
  Filled 2024-08-14: qty 3
  Filled 2024-08-14 (×2): qty 1
  Filled 2024-08-14: qty 5
  Filled 2024-08-14: qty 6
  Filled 2024-08-14: qty 1
  Filled 2024-08-14: qty 2

## 2024-08-14 MED ORDER — BISACODYL 10 MG RE SUPP: 10.0000 mg | Freq: Every day | RECTAL | Status: DC | PRN

## 2024-08-14 MED ORDER — AMLODIPINE BESYLATE 10 MG PO TABS
10.0000 mg | ORAL_TABLET | Freq: Every day | ORAL | Status: DC
  Administered 2024-08-15 – 2024-08-26 (×12): 10 mg via ORAL
  Filled 2024-08-14 (×12): qty 1

## 2024-08-14 MED ORDER — ANTICOAGULANT SODIUM CITRATE 4% (200MG/5ML) IV SOLN: 5.0000 mL | Status: DC | PRN

## 2024-08-14 MED ORDER — PROCHLORPERAZINE MALEATE 5 MG PO TABS: 5.0000 mg | ORAL_TABLET | Freq: Four times a day (QID) | ORAL | Status: DC | PRN

## 2024-08-14 MED ORDER — AMOXICILLIN-POT CLAVULANATE 500-125 MG PO TABS
1.0000 | ORAL_TABLET | Freq: Every day | ORAL | Status: AC
  Administered 2024-08-14 – 2024-08-17 (×4): 1 via ORAL
  Filled 2024-08-14 (×4): qty 1

## 2024-08-14 MED ORDER — GUAIFENESIN-DM 100-10 MG/5ML PO SYRP: 5.0000 mL | ORAL_SOLUTION | Freq: Four times a day (QID) | ORAL | Status: DC | PRN

## 2024-08-14 MED ORDER — ALTEPLASE 2 MG IJ SOLR: 2.0000 mg | Freq: Once | INTRAMUSCULAR | Status: DC | PRN

## 2024-08-14 MED ORDER — CARVEDILOL 12.5 MG PO TABS: 12.5000 mg | ORAL_TABLET | Freq: Two times a day (BID) | ORAL | Status: DC

## 2024-08-14 MED ORDER — PROCHLORPERAZINE EDISYLATE 10 MG/2ML IJ SOLN: 5.0000 mg | Freq: Four times a day (QID) | INTRAMUSCULAR | Status: DC | PRN

## 2024-08-14 MED ORDER — INSULIN ASPART 100 UNIT/ML IJ SOLN
0.0000 [IU] | Freq: Every day | INTRAMUSCULAR | Status: DC
  Administered 2024-08-14 – 2024-08-22 (×6): 2 [IU] via SUBCUTANEOUS
  Administered 2024-08-25: 1 [IU] via SUBCUTANEOUS
  Filled 2024-08-14 (×7): qty 2

## 2024-08-14 MED ORDER — AMOXICILLIN-POT CLAVULANATE 500-125 MG PO TABS: 1.0000 | ORAL_TABLET | Freq: Every day | ORAL | Status: DC

## 2024-08-14 NOTE — Procedures (Signed)
 I was present at this dialysis session. I have reviewed the session itself and made appropriate changes.   Filed Weights   08/09/24 1944 08/11/24 1340 08/14/24 0914  Weight: 66.8 kg 67.6 kg 68.6 kg    Recent Labs  Lab 08/08/24 0156 08/08/24 1234 08/14/24 0239  NA 133*   < > 135  K 5.7*   < > 4.9  CL 91*   < > 93*  CO2 28   < > 26  GLUCOSE 147*   < > 134*  BUN 35*   < > 50*  CREATININE 7.95*   < > 10.30*  CALCIUM  7.8*   < > 7.1*  PHOS 5.9*  --   --    < > = values in this interval not displayed.    Recent Labs  Lab 08/08/24 0156 08/10/24 0215 08/14/24 0239  WBC 8.9 7.5 7.6  NEUTROABS  --  4.5 4.2  HGB 11.5* 11.0* 10.7*  HCT 34.8* 32.3* 32.6*  MCV 94.6 93.6 95.0  PLT 152 139* 146*    Scheduled Meds:  amLODipine   10 mg Oral Daily   aspirin  EC  81 mg Oral Daily   carvedilol   12.5 mg Oral BID WC   Chlorhexidine  Gluconate Cloth  6 each Topical Q0600   Chlorhexidine  Gluconate Cloth  6 each Topical Q0600   clopidogrel   75 mg Oral Daily   ezetimibe   10 mg Oral Daily   heparin   5,000 Units Subcutaneous Q8H   insulin  aspart  0-5 Units Subcutaneous QHS   insulin  aspart  0-6 Units Subcutaneous TID WC   rosuvastatin   10 mg Oral Daily   Continuous Infusions:  ampicillin -sulbactam (UNASYN ) IV 1.5 g (08/13/24 2214)   anticoagulant sodium citrate      PRN Meds:.acetaminophen  **OR** acetaminophen , alteplase , anticoagulant sodium citrate , [START ON 08/15/2024] heparin , heparin , lidocaine  (PF), lidocaine -prilocaine , pentafluoroprop-tetrafluoroeth   Ephriam Stank, MD [facility] 08/14/2024, 10:42 AM

## 2024-08-14 NOTE — H&P (Deleted)
 Physical Medicine and Rehabilitation Admission H&P        Chief Complaint  Patient presents with   Functional deficits due to CVA    HPI: Blake Santana is a 54 year old male with PMHx ESRD on HD TTS, HFpEF, DM2 with diabetic retinopathy, anemia of chronic disease, HTN, HLD, PAD who presented to [facility] on 08/06/24 with difficulty speaking and choking with dinner the night prior. Slurred speech and left sided facial numbness returned around noon. In the BP 231/94 and labs revealed Na 134, K 6.4, BUN 50, cr 1036, Ca 8.2, alb 4.1 and WBC 8 K. NIHSS 7. CT head showed focal cortical and subcortical hypodensity at the left temporoparietal lobe suspicious for acute or subacute infarct. Neurology consulted and patient underwent MRI showed an acute left posterior MCA territory infarct involving the left anterior temporal lobe, overlying parietal lobe and posterior left insula, some edema without mass effect.    Recommended antithrombotic asprin and plavix  x 21 days followed by aspirin  monotherapy.          ROS  Global aphasia, cannot obtain             Past Medical History:  Diagnosis Date   Acute respiratory failure with hypoxia (HCC) 09/07/2018   Anemia of chronic kidney failure 03/11/2017   Cataracts, bilateral 2019   Chest pain 09/01/2018    minimal luminal irregularities   Chronic diastolic CHF (congestive heart failure) (HCC)     Chronic kidney disease 03/11/2017    Dialysis Tu/Th/Sa   COVID 2022    hospitalized   Diabetes mellitus without complication (HCC) 1999   Edema 01/14/2017   Headache     Hypercholesterolemia     Hypertension 03/11/2017   Hypertensive retinopathy of both eyes 2019   Iron  deficiency anemia 06/18/2016   Pneumonia 09/07/2018   Proliferative diabetic retinopathy with macular edema (HCC) 2019    Retinal hemorrhage, bilateral and left vitreous hemorrhage.  Being treated with Lucentis.  Dr. Raj   RSV (respiratory syncytial virus  infection) 08/2017             Past Surgical History:  Procedure Laterality Date   A/V FISTULAGRAM Left 01/19/2018    Procedure: A/V FISTULAGRAM - Left AV;  Surgeon: Sheree Penne Bruckner, MD;  Location: [facility] INVASIVE CV LAB;  Service: Cardiovascular;  Laterality: Left;   AV FISTULA PLACEMENT Left 08/09/2017    Procedure: LEFT RADIOCEPHALIC ARTERIOVENOUS (AV) FISTULA CREATION;  Surgeon: Oris Krystal FALCON, MD;  Location: [facility] OR;  Service: Vascular;  Laterality: Left;   AV FISTULA PLACEMENT Left 02/02/2018    Procedure: ARTERIOVENOUS (AV) FISTULA CREATION LEFT UPPER EXTREMITY;  Surgeon: Oris Krystal FALCON, MD;  Location: [facility] OR;  Service: Vascular;  Laterality: Left;   INSERTION OF DIALYSIS CATHETER   08/09/2017   INSERTION OF DIALYSIS CATHETER Right 08/09/2017    Procedure: INSERTION OF DIALYSIS CATHETER RIGHT INTERNAL JUGULAR;  Surgeon: Oris Krystal FALCON, MD;  Location: [facility] OR;  Service: Vascular;  Laterality: Right;   IR DIALY SHUNT INTRO NEEDLE/INTRACATH INITIAL W/IMG LEFT Left 07/10/2020   IR DIALY SHUNT INTRO NEEDLE/INTRACATH INITIAL W/IMG LEFT Left 04/14/2021   IR REMOVAL TUN CV CATH W/O FL   07/01/2018   LEFT HEART CATH AND CORONARY ANGIOGRAPHY N/A 09/05/2018    Procedure: LEFT HEART CATH AND CORONARY ANGIOGRAPHY;  Surgeon: Dann Candyce RAMAN, MD;  Location: [facility] INVASIVE CV LAB;  Service: Cardiovascular;  Laterality: N/A;   OPEN REDUCTION INTERNAL FIXATION (ORIF) DISTAL RADIAL FRACTURE  Left 11/20/2021    Procedure: POSSIBLE OPEN REDUCTION INTERNAL FIXATION (ORIF) DISTAL RADIAL FRACTURE;  Surgeon: Kendal Franky SQUIBB, MD;  Location: [facility] OR;  Service: Orthopedics;  Laterality: Left;   ORIF ANKLE FRACTURE Left 11/20/2021    Procedure: OPEN REDUCTION INTERNAL FIXATION (ORIF) ANKLE PILON FRACTURE;  Surgeon: Kendal Franky SQUIBB, MD;  Location: [facility] OR;  Service: Orthopedics;  Laterality: Left;   ORIF TIBIA PLATEAU Right 11/20/2021    Procedure: OPEN REDUCTION INTERNAL FIXATION (ORIF) TIBIAL PLATEAU;  Surgeon: Kendal Franky SQUIBB,  MD;  Location: [facility] OR;  Service: Orthopedics;  Laterality: Right;   PARATHYROIDECTOMY N/A 10/26/2022    Procedure: total PARATHYROIDECTOMY;  Surgeon: Eletha Boas, MD;  Location: [facility] OR;  Service: General;  Laterality: N/A;   PARATHYROIDECTOMY Right 10/26/2022    Procedure: AUTOTRANSPLANT TO RIGHT FOREARM;  Surgeon: Eletha Boas, MD;  Location: [facility] OR;  Service: General;  Laterality: Right;             Family History  Problem Relation Age of Onset   Diabetes Mother     Kidney disease Mother          renal failure--diabetes, cause of death   Hypertension Mother     Diabetes Father     Kidney disease Father          Cause of death:  kidney failure form diabetes   Alcohol abuse Father     Diabetes Sister          Social History:  reports that he has quit smoking. His smoking use included cigarettes. He has been exposed to tobacco smoke. He has never used smokeless tobacco. He reports that he does not currently use alcohol. He reports that he does not use drugs. Allergies: [Allergies]  [Allergies] No Known Allergies       Medications Prior to Admission  Medication Sig Dispense Refill   acetaminophen  (TYLENOL ) 500 MG tablet Take 1,000 mg by mouth every 6 (six) hours as needed for moderate pain or headache.       AgaMatrix Ultra-Thin Lancets MISC Check blood glucose twice daily before meals. 100 each 11   amLODipine  (NORVASC ) 10 MG tablet Take 1 tablet (10 mg total) by mouth daily. (Patient not taking: Reported on 08/07/2024) 90 tablet 3   aspirin -acetaminophen -caffeine (EXCEDRIN MIGRAINE) 250-250-65 MG tablet Take 1-2 tablets by mouth every 6 (six) hours as needed for headache.       calcitRIOL  (ROCALTROL ) 0.5 MCG capsule Take 6 capsules (3 mcg total) by mouth 2 (two) times daily. 60 capsule 4   calcium  carbonate (TUMS - DOSED IN MG ELEMENTAL CALCIUM ) 500 MG chewable tablet Chew 5 tablets (1,000 mg of elemental calcium  total) by mouth 4 (four) times daily. 120 tablet 2   carvedilol  (COREG )  12.5 MG tablet Take 1 tablet (12.5 mg total) by mouth 2 (two) times daily. 180 tablet 3   ezetimibe  (ZETIA ) 10 MG tablet Take 1 tablet (10 mg total) by mouth daily. (Patient not taking: Reported on 02/29/2024) 90 tablet 3   glipiZIDE  (GLUCOTROL ) 10 MG tablet Take 1 tablet (10 mg total) by mouth daily before breakfast. 90 tablet 3   glucose blood (TRUE METRIX BLOOD GLUCOSE TEST) test strip Use as instructed 100 each 12   lanthanum  (FOSRENOL ) 1000 MG chewable tablet Chew 1 tablet (1,000 mg total) by mouth 2 (two) times daily with a meal. 180 tablet 3   rosuvastatin  (CRESTOR ) 10 MG tablet Take 1 tablet (10 mg total) by mouth daily. 90 tablet 3  TRUEplus Lancets 28G MISC Use as directed 100 each 4              Home: Home Living Family/patient expects to be discharged to:: Private residence Living Arrangements: Spouse/significant other (and 3 children, youngest 61 yo) Available Help at Discharge:  (s/o works 6 pm until 9 pm, chidlren provide during those times) Type of Home: House Home Access: Stairs to enter [employer] of Steps: 2-3 Entrance Stairs-Rails: None Home Layout: One level Bathroom Shower/Tub: Associate Professor: Yes Home Equipment: None  Lives With: Significant other, Family   Functional History: Prior Function Prior Level of Function : Independent/Modified Independent Mobility Comments: No assistive device ADLs Comments: independent with BADL; does not drive or cook secondary to L eye visual deficits at baseline.   Functional Status:  Mobility: Bed Mobility Overal bed mobility: Needs Assistance Bed Mobility: Supine to Sit, Sit to Supine Supine to sit: Contact guard, +2 for safety/equipment Sit to supine: Contact guard assist, +2 for safety/equipment General bed mobility comments: CGA for safety Transfers Overall transfer level: Needs assistance Equipment used: Rolling walker (2 wheels) Transfers: Sit  to/from Stand Sit to Stand: Contact guard assist, +2 safety/equipment General transfer comment: CGA to rise from EOB, +2 for safety Ambulation/Gait Ambulation/Gait assistance: Contact guard assist, Min assist, +2 safety/equipment Gait Distance (Feet): 150 Feet Assistive device: Rolling walker (2 wheels) Gait Pattern/deviations: Step-through pattern, Decreased step length - right, Decreased step length - left, Decreased stride length, Drifts right/left, Trunk flexed General Gait Details: trialed RW today for gait with pt noted to have extreme forward posture and RW to far out in front of him. Constant cues for upright posture and proximity to RW however pt stated through in person interpreter that he couldnt do it. CGA/Min A overall. Gait velocity: decr Gait velocity interpretation: <1.31 ft/sec, indicative of household ambulator   ADL: ADL Overall ADL's : Needs assistance/impaired Eating/Feeding: Moderate assistance, Sitting Grooming: Wash/dry hands, Contact guard assist, Cueing for sequencing Grooming Details (indicate cue type and reason): pt able to stand at sink with CGA but needed max cues to sequene task, pt did not wash RUE despite max mutlimodal cues. Upper Body Bathing: Sitting, Maximal assistance Lower Body Bathing: Maximal assistance, Sit to/from stand Lower Body Bathing Details (indicate cue type and reason): pt attempted posterior pericare after toileting but needed MAX A for cleanliness Upper Body Dressing : Minimal assistance, Sitting Upper Body Dressing Details (indicate cue type and reason): to don gown as back side cover Lower Body Dressing: Maximal assistance, Sit to/from stand Toilet Transfer: Minimal assistance, Ambulation, Regular Toilet, Grab bars, Rolling walker (2 wheels) Toilet Transfer Details (indicate cue type and reason): MIN A for steadying assist and RW mgmt Toileting- Clothing Manipulation and Hygiene: Maximal assistance, Sit to/from stand Toileting -  Clothing Manipulation Details (indicate cue type and reason): MAX A for cleanliness Functional mobility during ADLs: Minimal assistance, Rolling walker (2 wheels) General ADL Comments: ADL participation impacted by R sided inattention, apraxia, impaired motor planning, impaired baseline vision   Cognition: Cognition Overall Cognitive Status: Difficult to assess Orientation Level: Oriented to person, Oriented to place, Oriented to situation, Disoriented to time Cognition Arousal: Alert Behavior During Therapy: [facility] for tasks assessed/performed, Flat affect Overall Cognitive Status: Difficult to assess   Physical Exam: Blood pressure (!) 181/89, pulse 68, temperature 98.1 F (36.7 C), resp. rate 17, height 5' 2 (1.575 m), weight 66.7 kg, SpO2 99%. Spanish interpreter via Ipad- limited use due to Global  aphasia Physical Exam   General: No acute distress Mood and affect are appropriate Heart: Regular rate and rhythm no rubs murmurs or extra sounds Lungs: Clear to auscultation, breathing unlabored, no rales or wheezes Abdomen: Positive bowel sounds, soft nontender to palpation, nondistended Extremities: No clubbing, cyanosis, or edema Skin: No evidence of breakdown, no evidence of rash Neurologic: Cranial nerves II through XII intact, motor strength is 5/5 in left and 2-/5 RIght  deltoid, bicep, tricep, grip, hip flexor, knee extensors, ankle dorsiflexor and plantar flexor- testing limited by aphasia and apraxia Sensory exam winces to pinch on fingers and toes, limited by aphasia  Cerebellar exam could not perform on RIght due to weakness Musculoskeletal: no pain with MMT No joint swelling  Lab Results Last 48 Hours        Results for orders placed or performed during the hospital encounter of 08/06/24 (from the past 48 hours)  Glucose, capillary     Status: Abnormal    Collection Time: 08/12/24  4:33 PM  Result Value Ref Range    Glucose-Capillary 217 (H) 70 - 99 mg/dL      Comment:  Glucose reference range applies only to samples taken after fasting for at least 8 hours.    Comment 1 Notify RN    Glucose, capillary     Status: Abnormal    Collection Time: 08/12/24  9:59 PM  Result Value Ref Range    Glucose-Capillary 153 (H) 70 - 99 mg/dL      Comment: Glucose reference range applies only to samples taken after fasting for at least 8 hours.    Comment 1 Notify RN      Comment 2 Document in Chart    Basic metabolic panel     Status: Abnormal    Collection Time: 08/13/24  1:36 AM  Result Value Ref Range    Sodium 134 (L) 135 - 145 mmol/L    Potassium 4.5 3.5 - 5.1 mmol/L    Chloride 92 (L) 98 - 111 mmol/L    CO2 28 22 - 32 mmol/L    Glucose, Bld 106 (H) 70 - 99 mg/dL      Comment: Glucose reference range applies only to samples taken after fasting for at least 8 hours.    BUN 35 (H) 6 - 20 mg/dL    Creatinine, Ser 1.34 (H) 0.61 - 1.24 mg/dL    Calcium  7.4 (L) 8.9 - 10.3 mg/dL    GFR, Estimated 7 (L) >60 mL/min      Comment: (NOTE) Calculated using the CKD-EPI Creatinine Equation (2021)      Anion gap 15 5 - 15      Comment: Performed at [facility] Lab, [address] [place], [place] [address]  Glucose, capillary     Status: Abnormal    Collection Time: 08/13/24  6:26 AM  Result Value Ref Range    Glucose-Capillary 121 (H) 70 - 99 mg/dL      Comment: Glucose reference range applies only to samples taken after fasting for at least 8 hours.  Glucose, capillary     Status: Abnormal    Collection Time: 08/13/24 11:12 AM  Result Value Ref Range    Glucose-Capillary 149 (H) 70 - 99 mg/dL      Comment: Glucose reference range applies only to samples taken after fasting for at least 8 hours.  Glucose, capillary     Status: Abnormal    Collection Time: 08/13/24  5:03 PM  Result Value Ref  Range    Glucose-Capillary 173 (H) 70 - 99 mg/dL      Comment: Glucose reference range applies only to samples taken after fasting for at least 8 hours.  Glucose, capillary      Status: Abnormal    Collection Time: 08/13/24  9:30 PM  Result Value Ref Range    Glucose-Capillary 170 (H) 70 - 99 mg/dL      Comment: Glucose reference range applies only to samples taken after fasting for at least 8 hours.  CBC with Differential/Platelet     Status: Abnormal    Collection Time: 08/14/24  2:39 AM  Result Value Ref Range    WBC 7.6 4.0 - 10.5 K/uL    RBC 3.43 (L) 4.22 - 5.81 MIL/uL    Hemoglobin 10.7 (L) 13.0 - 17.0 g/dL    HCT 67.3 (L) 60.9 - 52.0 %    MCV 95.0 80.0 - 100.0 fL    MCH 31.2 26.0 - 34.0 pg    MCHC 32.8 30.0 - 36.0 g/dL    RDW 87.0 88.4 - 84.4 %    Platelets 146 (L) 150 - 400 K/uL    nRBC 0.0 0.0 - 0.2 %    Neutrophils Relative % 57 %    Neutro Abs 4.2 1.7 - 7.7 K/uL    Lymphocytes Relative 24 %    Lymphs Abs 1.8 0.7 - 4.0 K/uL    Monocytes Relative 10 %    Monocytes Absolute 0.8 0.1 - 1.0 K/uL    Eosinophils Relative 8 %    Eosinophils Absolute 0.6 (H) 0.0 - 0.5 K/uL    Basophils Relative 1 %    Basophils Absolute 0.1 0.0 - 0.1 K/uL    Immature Granulocytes 0 %    Abs Immature Granulocytes 0.03 0.00 - 0.07 K/uL      Comment: Performed at [facility], [address], [place], [place] [address]  Basic metabolic panel with GFR     Status: Abnormal    Collection Time: 08/14/24  2:39 AM  Result Value Ref Range    Sodium 135 135 - 145 mmol/L    Potassium 4.9 3.5 - 5.1 mmol/L    Chloride 93 (L) 98 - 111 mmol/L    CO2 26 22 - 32 mmol/L    Glucose, Bld 134 (H) 70 - 99 mg/dL      Comment: Glucose reference range applies only to samples taken after fasting for at least 8 hours.    BUN 50 (H) 6 - 20 mg/dL    Creatinine, Ser 89.69 (H) 0.61 - 1.24 mg/dL    Calcium  7.1 (L) 8.9 - 10.3 mg/dL    GFR, Estimated 5 (L) >60 mL/min      Comment: (NOTE) Calculated using the CKD-EPI Creatinine Equation (2021)      Anion gap 16 (H) 5 - 15      Comment: Performed at [facility], [address], [place], [place] [address]  Magnesium     Status:  Abnormal    Collection Time: 08/14/24  2:39 AM  Result Value Ref Range    Magnesium 2.5 (H) 1.7 - 2.4 mg/dL      Comment: Performed at [facility], [address] [place], [place] [address]  Glucose, capillary     Status: Abnormal    Collection Time: 08/14/24  6:28 AM  Result Value Ref Range    Glucose-Capillary 107 (H) 70 - 99 mg/dL      Comment:  Glucose reference range applies only to samples taken after fasting for at least 8 hours.      Imaging Results (Last 48 hours)  No results found.         Blood pressure (!) 181/89, pulse 68, temperature 98.1 F (36.7 C), resp. rate 17, height 5' 2 (1.575 m), weight 66.7 kg, SpO2 99%.   Medical Problem List and Plan: 1. Functional deficits secondary to Left MCA infarct             -patient may  shower             -ELOS/Goals: 7-10d minA goals   2.  Antithrombotics: -DVT/anticoagulation:  Mechanical: Sequential compression devices, below knee Bilateral lower extremities Pharmaceutical: Heparin              -antiplatelet therapy: Aspirin  and Plavix  for 3 months, then followed by aspirin  alone.   3. Pain Management: Tylenol  prn    4. Mood/Behavior/Sleep: LCSW to follow for evaluation and support when available.              -antipsychotic agents: n/a             -sleep: Trazadone prn    5. Neuropsych/cognition: This patient is not capable of making decisions on his own behalf.   6. Skin/Wound Care: Routine pressure relief measures.    7. Fluids/Electrolytes/Nutrition: Monitor I&O and weight. Follow up labs monitored by HD.    8.  Acute CVA: Continue DAPT aspirin  and Plavix  for 3 months, then followed by aspirin  alone.  Continue Crestor  and Zetia .  Follow-up outpatient with cardiology for heart monitor.   9.  ESRD: On HD TTS, nephrology following.Did have HD today      Daphne LOISE Satterfield, NP 08/14/2024 I have personally performed a face to face diagnostic evaluation of this patient.  Additionally, I have reviewed and  concur with the physician assistant's documentation above. Prentice CHARLENA Compton M.D. [facility] Fellow Am Acad of Phys Med and Rehab Diplomate Am Board of Electrodiagnostic Med Fellow Am Board of Interventional Pain

## 2024-08-14 NOTE — Progress Notes (Signed)
 Inpatient Rehab Admissions Coordinator:  There is a bed available for pt in CIR today. Dr. Caleen aware and in agreement. Pt and pt's wife made aware utilizing video interpreter Fonda [phone]. TOC and NSG also made aware.   Blake Yvone Cohens, MS, CCC-SLP Admissions Coordinator [phone]

## 2024-08-14 NOTE — Progress Notes (Signed)
 Occupational Therapy Treatment Patient Details Name: Blake Santana MRN: [id number] DOB: 1971/03/23 Today's Date: 08/14/2024   History of present illness Pt is 55 y.o. male presented to [facility] on 08/07/23 for aphasia. MRI showed acute lt posterior MCA infarct. PMH - ESRD on HD, DM, Diabetic retinopahty, HTN.   OT comments  Pt progressing toward established OT goals. Facilitating RUE functional use with therapeutic activity and engagement in BADL of washing UB, donning socks, and retrieving medication cup (empty). Pt needing up to mod cues for encouragement and sequencing tasks as well as min A for OOB mobility. Will continue to follow.        If plan is discharge home, recommend the following:  A lot of help with walking and/or transfers;A lot of help with bathing/dressing/bathroom;Assistance with cooking/housework;Assist for transportation;Help with stairs or ramp for entrance;Direct supervision/assist for financial management;Direct supervision/assist for medications management;Assistance with feeding   Equipment Recommendations  Other (comment) (defer)    Recommendations for Other Services Rehab consult;Speech consult    Precautions / Restrictions Precautions Precautions: Fall;Other (comment) Recall of Precautions/Restrictions: Impaired Precaution/Restrictions Comments: aphasic, 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 Increased time and repeated cues needed.    Transfers Overall transfer level: Needs assistance Equipment used: Rolling walker (2 wheels) Transfers: Sit to/from Stand Sit to Stand: Min assist           General transfer  comment: min A for steadying prior to taking steps     Balance Overall balance assessment: Needs assistance Sitting-balance support: No upper extremity supported, Feet unsupported Sitting balance-Leahy Scale: Fair     Standing balance support: No upper extremity supported, During functional activity Standing balance-Leahy Scale: Poor Standing balance comment: min A dynamically                           ADL either performed or assessed with clinical judgement   ADL Overall ADL's : Needs assistance/impaired     Grooming: Wash/dry hands;Contact guard assist;Cueing for sequencing;Wash/dry face Grooming Details (indicate cue type and reason): mod + cues to sequence task             Lower Body Dressing: Set up;Bed level Lower Body Dressing Details (indicate cue type and reason): encouragement and multimodal cues needed but no hands on assist this session Toilet Transfer: Minimal assistance;+2 for safety/equipment           Functional mobility during ADLs: Minimal assistance;Rolling walker (2 wheels)      Extremity/Trunk Assessment Upper Extremity Assessment Upper Extremity Assessment: RUE deficits/detail RUE Deficits / Details: able to range pt through full ROM,however when asked to incorporate RUE into tasks pt reports he cant but pt initated with pt putting RUE on RW and pushing up from EOB with RUE as well as using to don sock, however, significantly decr dexterity and coordination, proprioceptive difficulty, sensation likely affecting performance RUE Sensation: decreased light touch RUE Coordination: decreased fine motor;decreased gross motor   Lower Extremity Assessment Lower Extremity Assessment: Defer to PT evaluation        Vision   Vision Assessment?: Vision impaired- to be further tested in functional context  Additional Comments: pt continues with R inattention but seems to be improving, able to recognize obstacles on R and engage with therapist on  L and R.   Perception Perception Perception: Impaired Preception Impairment Details: Inattention/Neglect   Praxis Praxis Praxis: Impaired Praxis Impairment Details: Motor planning;Ideomotor Praxis-Other Comments: suspect apraxia   Communication Communication Communication: Impaired Factors Affecting Communication: Difficulty expressing self;Non - English speaking, interpreter not available   Cognition Arousal: Alert Behavior During Therapy: WFL for tasks assessed/performed, Flat affect Cognition: Difficult to assess Difficult to assess due to: Impaired communication, Non-English speaking           OT - Cognition Comments: following one step commands today more consistently ~70% of session. greater attention to R side of environment. Continues to experience significant expressive aphasia. Oriented to hospital and month when provided options                 Following commands: Impaired Following commands impaired: Follows one step commands inconsistently, Follows one step commands with increased time      Cueing   Cueing Techniques: Verbal cues, Gestural cues, Tactile cues, Visual cues  Exercises Exercises: Other exercises Other Exercises Other Exercises: RUE AROM composite flexion, elbow flexion, AAROM shoulder flexion, washing L arm with RUE with wash cloth; cues for proprioception. provided squeeze ball    Shoulder Instructions       General Comments VSS    Pertinent Vitals/ Pain       Pain Assessment Pain Assessment: Faces Faces Pain Scale: Hurts little more Pain Location: toothache and grimacing when OT checked RUE ROM Pain Descriptors / Indicators: Grimacing Pain Intervention(s): Limited activity within patient's tolerance, Monitored during session  Home Living                                          Prior Functioning/Environment              Frequency  Min 2X/week        Progress Toward Goals  OT Goals(current goals  can now be found in the care plan section)  Progress towards OT goals: Progressing toward goals  Acute Rehab OT Goals OT Goal Formulation: With patient Time For Goal Achievement: 08/22/24 Potential to Achieve Goals: Good ADL Goals Pt Will Perform Grooming: with contact guard assist;standing Pt Will Perform Lower Body Dressing: with min assist;sit to/from stand Pt Will Transfer to Toilet: with contact guard assist;ambulating Pt/caregiver will Perform Home Exercise Program: Right Upper extremity;Increased ROM;Increased strength Additional ADL Goal #1: pt will follow 2 step commands with min cues Additional ADL Goal #2: OT to continue to assess vision  Plan      Co-evaluation                 AM-PAC OT 6 Clicks Daily Activity     Outcome Measure   Help from another person eating meals?: A Little Help from another person taking care of personal grooming?: A Lot Help from another person toileting, which includes using toliet, bedpan, or urinal?: A Lot Help from another person bathing (including washing, rinsing, drying)?: A Lot Help from another person to put on and taking off regular upper body clothing?: A Little Help from another person to put on and taking off regular lower body clothing?: A Lot 6 Click Score: 14    End of Session Equipment Utilized During Treatment: Gait belt;Rolling walker (2 wheels)  OT Visit Diagnosis: Unsteadiness on feet (R26.81);Muscle weakness (generalized) (M62.81);Low vision, both eyes (H54.2);Other symptoms and signs involving cognitive function;Hemiplegia and hemiparesis;Cognitive communication deficit (R41.841) Symptoms and signs involving cognitive functions: Cerebral infarction Hemiplegia - Right/Left: Right Hemiplegia - caused by: Cerebral infarction   Activity Tolerance Patient tolerated treatment well   Patient Left in bed;with call bell/phone within reach   Nurse Communication Mobility status        Time: 8590-8569 OT Time  Calculation (min): 21 min  Charges: OT General Charges $OT Visit: 1 Visit OT Treatments $Self Care/Home Management : 8-22 mins  [name], OTR/L [facility] Acute Rehabilitation Office: [phone]   [name] 08/14/2024, 4:34 PM

## 2024-08-14 NOTE — Progress Notes (Signed)
" °  [facility] Progress Note    Assessment/ Plan:   ESRD - on HD MWF - next HD Wed   Acute CVA  - L posterior MCA territory per MRI - aphasia - had CVA extension  -per primary   HTN/Volume - on norvasc  (coreg  dc'd)  - per neuro -> try to maintain SBP 130-150 given L M2 occlusion -will UF as tolerated   Anemia of esrd - Hb 11- 13 here, follow, no esa needs yet   Dispo - home w/ HH vs SNF for rehab, per primary   OP HD: [facility] 3h B350 65.2kg  AVF  Hep 2000  Last OP HD 1/02, post wt 67.2kg Hep B SAg neg 07/10/24 Heb B SAb +Immune 05/15/24  Subjective:   Patient seen and examined on HD. Tolerating treatment, no complaints.   Objective:   BP (!) 151/76   Pulse 61   Temp 98.2 F (36.8 C)   Resp 17   Ht 5' 2 (1.575 m)   Wt 68.6 kg   SpO2 92%   BMI 27.66 kg/m  No intake or output data in the 24 hours ending 08/14/24 1039 Weight change:   Physical Exam: Gen: NAD CVS: RRR Resp: CTA BL Abd: soft, nt/nd Ext: no edema Neuro: awake, alert, weak Dialysis access: LUE AVF +b/t  Imaging: No results found.  Labs: BMET Recent Labs  Lab 08/08/24 0156 08/08/24 1234 08/09/24 0152 08/10/24 0215 08/13/24 0136 08/14/24 0239  NA 133* 131* 132* 132* 134* 135  K 5.7* 5.4* 4.9 3.9 4.5 4.9  CL 91* 90* 89* 91* 92* 93*  CO2 28 26 26 29 28 26   GLUCOSE 147* 143* 153* 116* 106* 134*  BUN 35* 39* 47* 22* 35* 50*  CREATININE 7.95* 9.05* 10.20* 6.48* 8.65* 10.30*  CALCIUM  7.8* 7.7* 7.4* 7.7* 7.4* 7.1*  PHOS 5.9*  --   --   --   --   --    CBC Recent Labs  Lab 08/08/24 0156 08/10/24 0215 08/14/24 0239  WBC 8.9 7.5 7.6  NEUTROABS  --  4.5 4.2  HGB 11.5* 11.0* 10.7*  HCT 34.8* 32.3* 32.6*  MCV 94.6 93.6 95.0  PLT 152 139* 146*    Medications:     amLODipine   10 mg Oral Daily   aspirin  EC  81 mg Oral Daily   carvedilol   12.5 mg Oral BID WC   Chlorhexidine  Gluconate Cloth  6 each Topical Q0600   Chlorhexidine  Gluconate Cloth  6 each  Topical Q0600   clopidogrel   75 mg Oral Daily   ezetimibe   10 mg Oral Daily   heparin   5,000 Units Subcutaneous Q8H   insulin  aspart  0-5 Units Subcutaneous QHS   insulin  aspart  0-6 Units Subcutaneous TID WC   rosuvastatin   10 mg Oral Daily      Ephriam Stank, MD [facility] 08/14/2024, 10:39 AM   "

## 2024-08-14 NOTE — Progress Notes (Signed)
" °   08/14/24 1257  Vitals  Temp 98.1 F (36.7 C)  Pulse Rate 68  Resp 17  BP (!) 181/89  SpO2 99 %  Weight 66.7 kg  Type of Weight Post-Dialysis  Oxygen Therapy  Patient Activity (if Appropriate) In bed  Pulse Oximetry Type Continuous  Oximetry Probe Site Changed No  Post Treatment  Dialyzer Clearance Clear  Liters Processed 78  Fluid Removed (mL) 2000 mL  Tolerated HD Treatment Yes  AVG/AVF Arterial Site Held (minutes) 7 minutes  AVG/AVF Venous Site Held (minutes) 7 minutes    "

## 2024-08-14 NOTE — Discharge Summary (Signed)
 " Physician Discharge Summary   Patient: Blake Santana MRN: [id number] DOB: 17-Jan-1971  Admit date:     08/06/2024  Discharge date: 08/14/2024  Discharge Physician: Casimer Dare   PCP: Leavy Lucas Sula, PA-C   Recommendations at discharge:   Follow up with neurology as outpatient  Follow up with PCP/endocrine for further management of DM2 Follow up with dentist for dental pain   Discharge Diagnoses: Principal Problem:   Acute CVA (cerebrovascular accident) (HCC) Active Problems:   Type 2 diabetes mellitus with other diabetic kidney complication (HCC)   Elevated troponin   Hyperkalemia   Hypertensive emergency   Cerebrovascular accident (CVA) due to occlusion of left middle cerebral artery [facility])   Hospital Course:  54 y.o. male with medical history significant of ESRD on HD TTS, HFpEF, type 2 diabetes with diabetic retinopathy, anemia of chronic disease, hypertension, hyperlipidemia, PAD presented to the ED with complaints of aphasia, facial/hand weakness/numbness, and dysphagia.  Out of window for TNK at the time of arrival.  Blood pressure 231/94 on arrival but remainder of vital signs stable.  EKG without acute ischemic changes.  Brain MRI showing acute left posterior MCA territory infarct.  Patient was given IV hydralazine , IV labetalol , and IV Ativan  in the ED. Seen by neurology, and being discharged to CIR.   Assessment and Plan:  Acute CVA L MCA territory stroke per MRI, likely secondary to large vessel disease. CTA head and neck left M2 occlusion, severe bilateral P2 stenosis, right V4 moderate stenosis. Echo with EF 60 to 65%, DVT studies negative. LDL 122, hemoglobin A1c 8.5. - seen by neurology and recommended DAPT with aspirin  and plavix  for 3 months, followed by aspirin  alone indefinitely - continue statin and zetia  - goal SBP in the long run <130 - heart monitor ordered for stroke by cardiology team, f/u with cardiology and neurology as outpatient - will  also need better DM2 control, f/u with PCP/endocrine   #Hyperkalemia #ESRD on HD TTS Potassium 6.4 on presentation.  Medically treated in the ER, hemodialysis.  MWF schedule outpatient - received inpatient HD, resume outpatient HD   #Hypertensive emergency, improving In the setting of medication nonadherence.  SBP initially in the 230s, improved to 180s after IV hydralazine  and IV labetalol  in the ED. Given acute CVA, allowed permissive hypertension for 24 hours-> needs good long-term blood pressure control in the setting of CVA.   - Continue amlodipine , Coreg . BP better controlled   #Elevated troponin, questionable chest pain: Troponin elevated 45 and 49 in the setting of ESRD  - was monitored on tele, TTE completed  - already on dual antiplatelet therapy for acute stroke.   #Chronic HFpEF Last echo done in January 2020 showing EF 50 to 55%, mild mitral regurgitation, mild tricuspid regurgitation.  Repeat echo ordered in the setting of acute CVA as above.  Patient mildly hypoxic in the ER, currently off supplemental oxygen - fluid removal with HD   #Type 2 diabetes Poorly controlled , HbA1c 8.5 - resume home meds and f/u with PCP/endo for further management    #Anemia of chronic disease Hemoglobin stable.   #Hyperlipidemia #PAD - on statin, zetia , aspirin  and plavix     #Dental Pain - Improved with Unasyn  and discharged on augmentin  to complete 7 days - he needs outpatient dental care         Consultants: Neurology, cardiology  Procedures performed: None  Disposition: Skilled nursing facility, inpatient rehab Diet recommendation:  Dysphagia type 1 Thin Liquid DISCHARGE MEDICATION: Allergies  as of 08/14/2024   No Known Allergies      Medication List     TAKE these medications    acetaminophen  500 MG tablet Commonly known as: TYLENOL  Take 1,000 mg by mouth every 6 (six) hours as needed for moderate pain or headache.   AgaMatrix Ultra-Thin Lancets Misc Check  blood glucose twice daily before meals.   TRUEplus Lancets 28G Misc Use como se indica. (Use as directed)   amLODipine  10 MG tablet Commonly known as: NORVASC  Take 1 tablet (10 mg total) by mouth daily.   amoxicillin -clavulanate 500-125 MG tablet Commonly known as: Augmentin  Take 1 tablet by mouth daily for 4 days.   aspirin  EC 81 MG tablet Take 1 tablet (81 mg total) by mouth daily. Swallow whole.   aspirin -acetaminophen -caffeine 250-250-65 MG tablet Commonly known as: EXCEDRIN MIGRAINE Take 1-2 tablets by mouth every 6 (six) hours as needed for headache.   calcitRIOL  0.5 MCG capsule Commonly known as: ROCALTROL  Take 6 capsules (3 mcg total) by mouth 2 (two) times daily.   calcium  carbonate 500 MG chewable tablet Commonly known as: TUMS - dosed in mg elemental calcium  Chew 5 tablets (1,000 mg of elemental calcium  total) by mouth 4 (four) times daily.   carvedilol  12.5 MG tablet Commonly known as: COREG  Take 1 tablet (12.5 mg total) by mouth 2 (two) times daily.   clopidogrel  75 MG tablet Commonly known as: PLAVIX  Take 1 tablet (75 mg total) by mouth daily.   ezetimibe  10 MG tablet Commonly known as: ZETIA  Take 1 tablet (10 mg total) by mouth daily.   glipiZIDE  10 MG tablet Commonly known as: GLUCOTROL  Tome 1 tableta (10 mg en total) por va oral diariamente antes de desayunar. (Take 1 tablet (10 mg total) by mouth daily before breakfast.)   lanthanum  1000 MG chewable tablet Commonly known as: FOSRENOL  Chew 1 tablet (1,000 mg total) by mouth 2 (two) times daily with a meal.   rosuvastatin  10 MG tablet Commonly known as: Crestor  Tome 1 tableta (10 mg en total) por va oral diariamente. (Take 1 tablet (10 mg total) by mouth daily.)   True Metrix Blood Glucose Test test strip Generic drug: glucose blood Use segn las indicaciones. (Use as instructed)        Discharge Exam: Filed Weights   08/09/24 1944 08/11/24 1340 08/14/24 0914  Weight: 66.8 kg 67.6  kg 68.6 kg   Physical Exam Vitals and nursing note reviewed.  Constitutional:      General: He is not in acute distress. Cardiovascular:     Rate and Rhythm: Normal rate.  Neurological:     Comments: RUE and RLE 3/5 strength, left side 5/5, only mumbles some words, not consistently following commands       Condition at discharge: improving  The results of significant diagnostics from this hospitalization (including imaging, microbiology, ancillary and laboratory) are listed below for reference.   Imaging Studies: DG Orthopantogram Result Date: 08/11/2024 EXAM: ORTHOPANTOMOGRAM XRAY, 1 VIEW 08/11/2024 12:11:00 PM TECHNIQUE: Panorex view of the mandible. COMPARISON: None available. CLINICAL HISTORY: Tooth pain with chewing. FINDINGS: DENTAL: There is resorption of maxilla and prominent periapical erosions involving the tooth 1. Tooth 2 is absent. No additional dental caries or periapical erosions identified. BONES: No acute fracture. No malalignment. TMJ: No dislocation. SOFT TISSUES: Unremarkable. IMPRESSION: 1. Resorption of the maxilla and prominent periapical erosions involving tooth 1 in keeping with advanced periodontal disease. 2. Tooth 2 is absent. 3. No acute fracture or mandibular dislocation. Electronically signed  by: Dorethia Molt MD MD 08/11/2024 08:23 PM EST RP Workstation: [id number]   VAS US  LOWER EXTREMITY VENOUS (DVT) Result Date: 08/09/2024  Lower Venous DVT Study Patient Name:  TAINO MAERTENS ACEVEDO-OCEJO  Date of Exam:   08/09/2024 Medical Rec #: [id number]                 Accession #:    [id number] Date of Birth: Mar 05, 1971                 Patient Gender: M Patient Age:   89 years Exam Location:  [facility] Procedure:      VAS US  LOWER EXTREMITY VENOUS (DVT) Referring Phys: ARY XU --------------------------------------------------------------------------------  Indications: Stroke.  Risk Factors: None identified. Comparison Study: No prior studies. Performing  Technologist: Cordella Collet RVT  Examination Guidelines: A complete evaluation includes B-mode imaging, spectral Doppler, color Doppler, and power Doppler as needed of all accessible portions of each vessel. Bilateral testing is considered an integral part of a complete examination. Limited examinations for reoccurring indications may be performed as noted. The reflux portion of the exam is performed with the patient in reverse Trendelenburg.  +---------+---------------+---------+-----------+----------+--------------+ RIGHT    CompressibilityPhasicitySpontaneityPropertiesThrombus Aging +---------+---------------+---------+-----------+----------+--------------+ CFV      Full           Yes      Yes                                 +---------+---------------+---------+-----------+----------+--------------+ SFJ      Full                                                        +---------+---------------+---------+-----------+----------+--------------+ FV Prox  Full                                                        +---------+---------------+---------+-----------+----------+--------------+ FV Mid   Full                                                        +---------+---------------+---------+-----------+----------+--------------+ FV DistalFull                                                        +---------+---------------+---------+-----------+----------+--------------+ PFV      Full                                                        +---------+---------------+---------+-----------+----------+--------------+ POP      Full           Yes      Yes                                 +---------+---------------+---------+-----------+----------+--------------+  PTV      Full                                                        +---------+---------------+---------+-----------+----------+--------------+ PERO     Full                                                         +---------+---------------+---------+-----------+----------+--------------+   +---------+---------------+---------+-----------+----------+--------------+ LEFT     CompressibilityPhasicitySpontaneityPropertiesThrombus Aging +---------+---------------+---------+-----------+----------+--------------+ CFV      Full           Yes      Yes                                 +---------+---------------+---------+-----------+----------+--------------+ SFJ      Full                                                        +---------+---------------+---------+-----------+----------+--------------+ FV Prox  Full                                                        +---------+---------------+---------+-----------+----------+--------------+ FV Mid   Full                                                        +---------+---------------+---------+-----------+----------+--------------+ FV DistalFull           Yes      Yes                                 +---------+---------------+---------+-----------+----------+--------------+ PFV      Full                                                        +---------+---------------+---------+-----------+----------+--------------+ POP      Full           Yes      Yes                                 +---------+---------------+---------+-----------+----------+--------------+ PTV      Full                                                        +---------+---------------+---------+-----------+----------+--------------+  PERO     Full                                                        +---------+---------------+---------+-----------+----------+--------------+     Summary: RIGHT: - There is no evidence of deep vein thrombosis in the lower extremity.  - No cystic structure found in the popliteal fossa.  LEFT: - There is no evidence of deep vein thrombosis in the lower extremity.  - No cystic structure found in  the popliteal fossa.  *See table(s) above for measurements and observations. Electronically signed by Gaile New MD on 08/09/2024 at 7:08:42 PM.    Final    MR ANGIO HEAD WO CONTRAST Result Date: 08/09/2024 EXAM: MR Angiography Head without intravenous Contrast. 08/08/2024 09:52:09 PM TECHNIQUE: Magnetic resonance angiography images of the head without intravenous contrast. Multiplanar 2D and 3D reformatted images are provided for review. COMPARISON: MR Head Without IV Contrast 08/06/2024. CLINICAL HISTORY: Stroke, follow up. FINDINGS: Mild to moderately motion limited study. ANTERIOR CIRCULATION: No significant stenosis of the internal carotid arteries. No significant stenosis of the anterior cerebral arteries. Hypoplastic right A1 ACA. Similar occluded proximal left M2 MCA. No aneurysm. POSTERIOR CIRCULATION: Severe right and moderate left P2 PCA stenosis. No significant stenosis of the basilar artery. No significant stenosis of the vertebral arteries. No aneurysm. IMPRESSION: 1. Similar occluded proximal left M2 MCA. 2. Severe right and moderate left P2 PCA stenosis. Electronically signed by: Gilmore Molt 08/09/2024 03:35 AM EST RP Workstation: [id number]   MR BRAIN WO CONTRAST Result Date: 08/09/2024 EXAM: MRI BRAIN WITHOUT CONTRAST 08/08/2024 09:38:18 PM TECHNIQUE: Multiplanar multisequence MRI of the head/brain was performed without the administration of intravenous contrast. COMPARISON: MRI head 08/06/2024 CLINICAL HISTORY: Stroke, follow up. FINDINGS: BRAIN AND VENTRICLES: Increasingly confluent and expanded left posterior MCA territory infarct. Increased edema with slight (2 mm) rightward midline shift. Numerous punctate foci of susceptibility artifact throughout the brain. No intracranial hemorrhage. No mass. No hydrocephalus. The sella is unremarkable. ORBITS: No acute abnormality. SINUSES AND MASTOIDS: No acute abnormality. BONES AND SOFT TISSUES: Normal marrow signal. No acute soft tissue  abnormality. IMPRESSION: 1. In comparison to 08/06/2024 MRI , increasingly confluent and expanded left posterior MCA territory infarct. Increased edema with slight (2 mm) rightward midline shift. 2. Numerous punctate foci of susceptibility artifact throughout the brain, possibly prior micro emboli. Electronically signed by: Gilmore Molt 08/09/2024 03:27 AM EST RP Workstation: [id number]   DG Shoulder Right Port Result Date: 08/08/2024 CLINICAL DATA:  Right shoulder pain. EXAM: RIGHT SHOULDER - 1 VIEW COMPARISON:  None Available. FINDINGS: There is no evidence of acute fracture or dislocation. A small chronic appearing deformity is seen along the dorsal aspect of the distal right clavicle. Soft tissues are unremarkable. Mild to moderate severity atelectasis and/or infiltrate is noted within the visualized portion of the right lung base. IMPRESSION: 1. No acute osseous abnormality. Electronically Signed   By: Suzen Dials M.D.   On: 08/08/2024 20:20   ECHOCARDIOGRAM COMPLETE Result Date: 08/07/2024    ECHOCARDIOGRAM REPORT   Patient Name:   Blayne Frankie Acevedo-Ocejo Date of Exam: 08/07/2024 Medical Rec #:  [id number]                Height:       62.0 in Accession #:    [id number]  Weight:       146.0 lb Date of Birth:  05/12/1971                BSA:          1.672 m Patient Age:    53 years                 BP:           205/100 mmHg Patient Gender: M                        HR:           93 bpm. Exam Location:  Inpatient Procedure: 2D Echo, Cardiac Doppler and Color Doppler (Both Spectral and Color            Flow Doppler were utilized during procedure). Indications:    Stroke I63.9  History:        Patient has prior history of Echocardiogram examinations, most                 recent 04/03/2017. Risk Factors:Hypertension.  Sonographer:    Nathanel Devonshire Referring Phys: EDITHA RAM IMPRESSIONS  1. Left ventricular ejection fraction, by estimation, is 60 to 65%. The left ventricle has normal  function. The left ventricle has no regional wall motion abnormalities. There is mild left ventricular hypertrophy. Left ventricular diastolic parameters are indeterminate.  2. Right ventricular systolic function is normal. The right ventricular size is normal.  3. The mitral valve is normal in structure. Trivial mitral valve regurgitation. No evidence of mitral stenosis.  4. The aortic valve is normal in structure. Aortic valve regurgitation is not visualized. No aortic stenosis is present.  5. The inferior vena cava is normal in size with greater than 50% respiratory variability, suggesting right atrial pressure of 3 mmHg. Conclusion(s)/Recommendation(s): No intracardiac source of embolism detected on this transthoracic study. Consider a transesophageal echocardiogram to exclude cardiac source of embolism if clinically indicated. FINDINGS  Left Ventricle: Left ventricular ejection fraction, by estimation, is 60 to 65%. The left ventricle has normal function. The left ventricle has no regional wall motion abnormalities. The left ventricular internal cavity size was normal in size. There is  mild left ventricular hypertrophy. Abnormal (paradoxical) septal motion, consistent with left bundle branch block. Left ventricular diastolic parameters are indeterminate. Right Ventricle: The right ventricular size is normal. No increase in right ventricular wall thickness. Right ventricular systolic function is normal. Left Atrium: Left atrial size was normal in size. Right Atrium: Right atrial size was normal in size. Pericardium: There is no evidence of pericardial effusion. Mitral Valve: The mitral valve is normal in structure. Trivial mitral valve regurgitation. No evidence of mitral valve stenosis. Tricuspid Valve: The tricuspid valve is normal in structure. Tricuspid valve regurgitation is not demonstrated. No evidence of tricuspid stenosis. Aortic Valve: The aortic valve is normal in structure. Aortic valve  regurgitation is not visualized. No aortic stenosis is present. Aortic valve mean gradient measures 5.0 mmHg. Aortic valve peak gradient measures 10.0 mmHg. Aortic valve area, by VTI measures 1.60 cm. Pulmonic Valve: The pulmonic valve was normal in structure. Pulmonic valve regurgitation is trivial. No evidence of pulmonic stenosis. Aorta: The aortic root is normal in size and structure. Venous: The inferior vena cava is normal in size with greater than 50% respiratory variability, suggesting right atrial pressure of 3 mmHg. IAS/Shunts: No atrial level shunt detected by color flow Doppler.  LEFT VENTRICLE PLAX 2D  LVIDd:         4.50 cm LVIDs:         3.00 cm LV PW:         1.30 cm LV IVS:        1.30 cm LVOT diam:     1.80 cm LV SV:         47 LV SV Index:   28 LVOT Area:     2.54 cm  LV Volumes (MOD) LV vol d, MOD A2C: 71.3 ml LV vol d, MOD A4C: 83.2 ml LV vol s, MOD A2C: 27.2 ml LV vol s, MOD A4C: 32.5 ml LV SV MOD A2C:     44.1 ml LV SV MOD A4C:     83.2 ml LV SV MOD BP:      49.1 ml RIGHT VENTRICLE RV Basal diam:  2.80 cm RV S prime:     15.00 cm/s TAPSE (M-mode): 1.9 cm LEFT ATRIUM             Index        RIGHT ATRIUM           Index LA diam:        3.10 cm 1.85 cm/m   RA Area:     12.20 cm LA Vol (A2C):   40.9 ml 24.46 ml/m  RA Volume:   25.70 ml  15.37 ml/m LA Vol (A4C):   52.9 ml 31.63 ml/m LA Biplane Vol: 47.5 ml 28.40 ml/m  AORTIC VALVE                    PULMONIC VALVE AV Area (Vmax):    1.69 cm     PV Vmax:       1.17 m/s AV Area (Vmean):   1.47 cm     PV Peak grad:  5.5 mmHg AV Area (VTI):     1.60 cm AV Vmax:           158.00 cm/s AV Vmean:          99.300 cm/s AV VTI:            0.293 m AV Peak Grad:      10.0 mmHg AV Mean Grad:      5.0 mmHg LVOT Vmax:         105.00 cm/s LVOT Vmean:        57.200 cm/s LVOT VTI:          0.184 m LVOT/AV VTI ratio: 0.63  AORTA Ao Root diam: 2.70 cm Ao Asc diam:  2.90 cm  SHUNTS Systemic VTI:  0.18 m Systemic Diam: 1.80 cm Oneil Parchment MD Electronically  signed by Oneil Parchment MD Signature Date/Time: 08/07/2024/4:34:40 PM    Final    CT VENOGRAM HEAD Result Date: 08/07/2024 EXAM: CT VENOGRAM WITH CONTRAST 08/07/2024 03:42:47 AM TECHNIQUE: CT venogram of the head/brain was performed with the administration of intravenous contrast. Multiplanar reformatted images are provided for review. MIP images are provided for review. Automated exposure control, iterative reconstruction, and/or weight based adjustment of the mA/kV was utilized to reduce the radiation dose to as low as reasonably achievable. COMPARISON: None available. CLINICAL HISTORY: Dural venous sinus thrombosis suspected FINDINGS: No dural venous sinus thrombosis. No significant stenosis. IMPRESSION: 1. No dural venous sinus thrombosis. Electronically signed by: Gilmore Molt 08/07/2024 04:03 AM EST RP Workstation: [id number]   CT ANGIO HEAD NECK W WO CM Result Date: 08/07/2024 EXAM: CTA HEAD AND NECK WITH AND WITHOUT 08/07/2024 03:42:47  AM TECHNIQUE: CTA of the head and neck was performed with and without the administration of intravenous contrast. Multiplanar 2D and/or 3D reformatted images are provided for review. Automated exposure control, iterative reconstruction, and/or weight based adjustment of the mA/kV was utilized to reduce the radiation dose to as low as reasonably achievable. Stenosis of the internal carotid arteries measured using NASCET criteria. COMPARISON: None available CLINICAL HISTORY: Neuro deficit, acute, stroke suspected Neuro deficit, acute, stroke suspected FINDINGS: AORTIC ARCH AND ARCH VESSELS: No dissection or arterial injury. No significant stenosis of the brachiocephalic or subclavian arteries. CERVICAL CAROTID ARTERIES: No dissection, arterial injury, or hemodynamically significant stenosis by NASCET criteria. CERVICAL VERTEBRAL ARTERIES: No dissection, arterial injury, or significant stenosis. LUNGS AND MEDIASTINUM: Unremarkable. SOFT TISSUES: No acute abnormality. BONES: No  acute abnormality. ANTERIOR CIRCULATION: No significant stenosis of the internal carotid arteries. No significant stenosis of the anterior cerebral arteries. Occluded proximal left M2 MCA branch. No aneurysm. POSTERIOR CIRCULATION: PCAs are patent. Severe bilateral P2 PCA stenosis. No significant stenosis of the basilar artery. Vertebral arteries are patent. Moderate right intradural vertebral artery stenosis. No aneurysm. OTHER: No dural venous sinus thrombosis on this non-dedicated study. Findings discussed with Dr. Khaliqdina via telephone at 3:55 AM. IMPRESSION: 1. Occluded proximal left M2 MCA branch. 2. Severe bilateral P2 PCA stenosis. 3. Moderate right intradural vertebral artery stenosis. Electronically signed by: Gilmore Molt 08/07/2024 04:00 AM EST RP Workstation: [id number]   DG CHEST PORT 1 VIEW Result Date: 08/07/2024 EXAM: 1 VIEW(S) XRAY OF THE CHEST 08/07/2024 02:24:13 AM COMPARISON: 11/17/2021 CLINICAL HISTORY: Hypoxia 200808 Hypoxia FINDINGS: LUNGS AND PLEURA: Low lung volumes. No focal pulmonary opacity. No pleural effusion. No pneumothorax. HEART AND MEDIASTINUM: Cardiomegaly with vascular congestion. BONES AND SOFT TISSUES: No acute osseous abnormality. IMPRESSION: 1. Cardiomegaly with vascular congestion. 2. Low lung volumes. Electronically signed by: Franky Crease MD 08/07/2024 02:26 AM EST RP Workstation: [id number]   MR BRAIN WO CONTRAST Result Date: 08/06/2024 EXAM: MRI BRAIN WITHOUT CONTRAST 08/06/2024 09:36:57 PM TECHNIQUE: Multiplanar multisequence MRI of the head/brain was performed without the administration of intravenous contrast. COMPARISON: CT head earlier today. CLINICAL HISTORY: Neuro deficit, acute, stroke suspected FINDINGS: BRAIN AND VENTRICLES: Acute left posterior MCA territory infarct involving the left anterior temporal lobe, overlying parietal lobe and posterior left insula. Edema without mass effect. Numerous punctate foci of susceptibility artifact throughout the  brain. No intracranial hemorrhage. No mass. No midline shift. No hydrocephalus. The sella is unremarkable. Normal flow voids. ORBITS: No acute abnormality. SINUSES AND MASTOIDS: No acute abnormality. BONES AND SOFT TISSUES: Normal marrow signal. No acute soft tissue abnormality. IMPRESSION: 1. Acute left posterior MCA territory infarct. 2. Numerous punctate foci of susceptibility artifact throughout the brain, possibly prior micro emboli. Electronically signed by: Gilmore Molt 08/06/2024 09:54 PM EST RP Workstation: [id number]   CT HEAD WO CONTRAST Result Date: 08/06/2024 CLINICAL DATA:  Altered aphasia EXAM: CT HEAD WITHOUT CONTRAST TECHNIQUE: Contiguous axial images were obtained from the base of the skull through the vertex without intravenous contrast. RADIATION DOSE REDUCTION: This exam was performed according to the departmental dose-optimization program which includes automated exposure control, adjustment of the mA and/or kV according to patient size and/or use of iterative reconstruction technique. COMPARISON:  CT brain 11/17/2021 FINDINGS: Brain: Focal cortical and subcortical hypodensity at the left temporoparietal lobe. No hemorrhage. No mass. No significant mass effect or midline shift. The ventricles are nonenlarged Vascular: No hyperdense vessels. Carotid and vertebral calcification. Extensive small vessel calcification. Skull: No fracture Sinuses/Orbits: No acute  finding. Other: None IMPRESSION: Focal cortical and subcortical hypodensity at the left temporoparietal lobe, suspicious for acute to subacute infarct. No hemorrhage. No significant mass effect or midline shift. Electronically Signed   By: Luke Bun M.D.   On: 08/06/2024 18:09    Microbiology: Results for orders placed or performed during the hospital encounter of 08/06/24  MRSA Next Gen by PCR, Nasal     Status: None   Collection Time: 08/09/24  1:20 PM   Specimen: Nasal Mucosa; Nasal Swab  Result Value Ref Range Status    MRSA by PCR Next Gen NOT DETECTED NOT DETECTED Final    Comment: (NOTE) The GeneXpert MRSA Assay (FDA approved for NASAL specimens only), is one component of a comprehensive MRSA colonization surveillance program. It is not intended to diagnose MRSA infection nor to guide or monitor treatment for MRSA infections. Test performance is not FDA approved in patients less than 30 years old. Performed at [facility], [address] [place], [place] [address]     Labs: CBC: Recent Labs  Lab 08/08/24 0156 08/10/24 0215 08/14/24 0239  WBC 8.9 7.5 7.6  NEUTROABS  --  4.5 4.2  HGB 11.5* 11.0* 10.7*  HCT 34.8* 32.3* 32.6*  MCV 94.6 93.6 95.0  PLT 152 139* 146*   Basic Metabolic Panel: Recent Labs  Lab 08/08/24 0156 08/08/24 1234 08/09/24 0152 08/10/24 0215 08/13/24 0136 08/14/24 0239  NA 133* 131* 132* 132* 134* 135  K 5.7* 5.4* 4.9 3.9 4.5 4.9  CL 91* 90* 89* 91* 92* 93*  CO2 28 26 26 29 28 26   GLUCOSE 147* 143* 153* 116* 106* 134*  BUN 35* 39* 47* 22* 35* 50*  CREATININE 7.95* 9.05* 10.20* 6.48* 8.65* 10.30*  CALCIUM  7.8* 7.7* 7.4* 7.7* 7.4* 7.1*  MG  --   --   --   --   --  2.5*  PHOS 5.9*  --   --   --   --   --    Liver Function Tests: Recent Labs  Lab 08/08/24 0156  ALBUMIN 3.8   CBG: Recent Labs  Lab 08/13/24 0626 08/13/24 1112 08/13/24 1703 08/13/24 2130 08/14/24 0628  GLUCAP 121* 149* 173* 170* 107*    Discharge time spent: 40 minutes   Signed: Casimer Dare, MD [facility] 08/14/2024 "

## 2024-08-14 NOTE — TOC Transition Note (Signed)
 Transition of Care [facility]) - Discharge Note   Patient Details  Name: Blake Santana MRN: [id number] Date of Birth: 1971/04/30  [facility] CM/SW Contact:  Andrez JULIANNA George, RN Phone Number: 08/14/2024, 1:48 PM   Clinical Narrative:     Pt is discharging to CIR today. No further needs per CM.  Final next level of care: IP Rehab Facility Barriers to Discharge: Inadequate or no insurance, Barriers Unresolved (comment)   Patient Goals and CMS Choice   CMS Medicare.gov Compare Post Acute Care list provided to:: Patient Represenative (must comment) Choice offered to / list presented to : Spouse      Discharge Placement                       Discharge Plan and Services Additional resources added to the After Visit Summary for     Discharge Planning Services: CM Consult Post Acute Care Choice: IP Rehab                               Social Drivers of Health (SDOH) Interventions SDOH Screenings   Food Insecurity: Food Insecurity Present (08/08/2024)  Housing: High Risk (08/08/2024)  Transportation Needs: No Transportation Needs (08/08/2024)  Utilities: Not At Risk (08/08/2024)  Alcohol Screen: Low Risk (02/29/2024)  Depression (PHQ2-9): Low Risk (02/29/2024)  Financial Resource Strain: Low Risk (02/29/2024)  Physical Activity: Inactive (02/29/2024)  Social Connections: Unknown (08/08/2024)  Stress: No Stress Concern Present (02/29/2024)  Tobacco Use: Medium Risk (08/06/2024)  Health Literacy: Adequate Health Literacy (02/29/2024)     Readmission Risk Interventions     No data to display

## 2024-08-14 NOTE — Progress Notes (Signed)
 Came in awake and alert Consent verified and on file. Started with no complaints  Tx duration:3.25 hours UF removal: 2L  Access used: Left AVF Access issue: None  Tx completed and tolerated. Pressure dressing applied. Endorsed to floor nurse. Transported back to room.   Abbe Bula Rubi Lynard Postlewait, RN Kidney Care Unit

## 2024-08-14 NOTE — Progress Notes (Signed)
 "  Signed      Expand All Collapse All PMR Admission Coordinator Pre-Admission Assessment   Patient: Blake Santana is an 54 y.o., male MRN: [id number] DOB: 1971-07-22 Height: 5' 2 (157.5 cm) Weight: 68.6 kg   Insurance Information HMO:     PPO:      PCP:      IPA:      80/20:      OTHER:  PRIMARY: self pay    Self-Pay estimate given on 08/14/24  Policy#:       Subscriber:  CM Name:       Phone#:      Fax#:  Pre-Cert#:       Employer:  Benefits:  Phone #:      Name:  Eff. Date:      Deduct:       Out of Pocket Max:       Life Max:  CIR:       SNF:  Outpatient:      Co-Pay:  Home Health:       Co-Pay:  DME:      Co-Pay:  Providers:    Not a [place]  resident and without a green card. Not eligible for Medicaid  SECONDARY:       Policy#:      Phone#:    Artist:       Phone#:    The Best Boy for patients in Inpatient Rehabilitation Facilities with attached Privacy Act Statement-Health Care Records was provided and verbally reviewed with: Family   Emergency Contact Information Contact Information       Name Relation Home Work Mobile    Olmda-Gonzalez,Gladys Spouse [phone]             Other Contacts       Name Relation Home Work Mobile    [facility]   [phone]      Jaycee Settle Daughter     [phone]         Current Medical History  Patient Admitting Diagnosis: CVA   History of Present Illness:  Pt is a 54 year old male with medical hx significant for: ESRD on HD MWF, type II diabetes with diabetic retinopathy, HTN, anemia of chronic disease, hyperlipidemia, PAD, CHF.  Pt presented to [facility] 08[facility]07[facility]24 d/t concerns for aphasia and left-sided weakness to left face and hand. Pt had episode of dysphagia and slurred speech the night prior to presentation. Pt hypertensive with systolic of 240. Pt given labetalol  without improvement then given hydralazine  which decreased BP to 180.  Pt also noted to be hyperkalemic. CT showed focal cortical and subcortical hyperdensity at left temporal parietal lobe.SABRA MRI showed left posterior MCA stroke. Nephrology and neurology consulted. CTA showed left M2 MCA occlusion, severe PCA P2 stenosis. Pt reported RUE weakness on 1/6. X-ray and repeat MRI ordered. Repeat MRI showed increased edema and slight midline shift. Right shoulder x-ray negative. Panorex ordered d/t dental pain. Noted resorption of maxilla and prominent periapical erosions involving tooth 1 in keeping with advanced periodontal disease. Therapy evaluations completed and CIR recommended d/t pt's deficits in functional mobility.    Complete NIHSS TOTAL: 14   Patient's medical [facility] from [facility] has been reviewed by the rehabilitation admission coordinator and physician.   Past Medical History      Past Medical History:  Diagnosis Date   Acute respiratory failure with hypoxia (HCC) 09/07/2018   Anemia of chronic kidney failure 03/11/2017  Cataracts, bilateral 2019   Chest pain 09/01/2018    minimal luminal irregularities   Chronic diastolic CHF (congestive heart failure) (HCC)     Chronic kidney disease 03/11/2017    Dialysis Tu/Th/Sa   COVID 2022    hospitalized   Diabetes mellitus without complication (HCC) 1999   Edema 01/14/2017   Headache     Hypercholesterolemia     Hypertension 03/11/2017   Hypertensive retinopathy of both eyes 2019   Iron  deficiency anemia 06/18/2016   Pneumonia 09/07/2018   Proliferative diabetic retinopathy with macular edema (HCC) 2019    Retinal hemorrhage, bilateral and left vitreous hemorrhage.  Being treated with Lucentis.  Dr. Raj   RSV (respiratory syncytial virus infection) 08/2017        Has the patient had major surgery during 100 days prior to admission? No   Family History   family history includes Alcohol abuse in his father; Diabetes in his father, mother, and sister; Hypertension in his mother; Kidney  disease in his father and mother.   Current Medications [Current Medications]  [Current Medications]   Current Facility-Administered Medications:    acetaminophen  (TYLENOL ) tablet 650 mg, 650 mg, Oral, Q6H PRN, 650 mg at 08/12/24 0932 **OR** acetaminophen  (TYLENOL ) suppository 650 mg, 650 mg, Rectal, Q6H PRN, Alfornia Madison, MD   alteplase  (CATHFLO ACTIVASE ) injection 2 mg, 2 mg, Intracatheter, Once PRN, Geralynn Charleston, MD   amLODipine  (NORVASC ) tablet 10 mg, 10 mg, Oral, Daily, Sigdel, Santosh, MD, 10 mg at 08/13/24 9077   ampicillin -sulbactam (UNASYN ) 1.5 g in sodium chloride  0.9 % 100 mL IVPB, 1.5 g, Intravenous, Q12H, Maranda Lonni MATSU, MD, Last Rate: 200 mL/hr at 08/13/24 2214, 1.5 g at 08/13/24 2214   anticoagulant sodium citrate  solution 5 mL, 5 mL, Intracatheter, PRN, Geralynn Charleston, MD   aspirin  EC tablet 81 mg, 81 mg, Oral, Daily, Jerri Pfeiffer, MD, 81 mg at 08/13/24 9077   carvedilol  (COREG ) tablet 12.5 mg, 12.5 mg, Oral, BID WC, Imam, Shehab F, DO, 12.5 mg at 08/13/24 1709   Chlorhexidine  Gluconate Cloth 2 % PADS 6 each, 6 each, Topical, Q0600, Geralynn Charleston, MD, 6 each at 08/14/24 0621   Chlorhexidine  Gluconate Cloth 2 % PADS 6 each, 6 each, Topical, Q0600, Geralynn Charleston, MD, 6 each at 08/14/24 9378   clopidogrel  (PLAVIX ) tablet 75 mg, 75 mg, Oral, Daily, Sigdel, Santosh, MD, 75 mg at 08/13/24 9077   ezetimibe  (ZETIA ) tablet 10 mg, 10 mg, Oral, Daily, Jerri Pfeiffer, MD, 10 mg at 08/13/24 0922   [START ON 08/15/2024] heparin  injection 1,000 Units, 1,000 Units, Dialysis, PRN, Geralynn Charleston, MD   heparin  injection 1,000 Units, 1,000 Units, Intracatheter, PRN, Geralynn Charleston, MD   heparin  injection 5,000 Units, 5,000 Units, Subcutaneous, Q8H, Rathore, Vasundhra, MD, 5,000 Units at 08/14/24 9378   insulin  aspart (novoLOG ) injection 0-5 Units, 0-5 Units, Subcutaneous, QHS, Rathore, Vasundhra, MD   insulin  aspart (novoLOG ) injection 0-6 Units, 0-6 Units, Subcutaneous, TID WC,  Rathore, Vasundhra, MD, 1 Units at 08/13/24 1715   lidocaine  (PF) (XYLOCAINE ) 1 % injection 5 mL, 5 mL, Intradermal, PRN, Geralynn Charleston, MD   lidocaine -prilocaine  (EMLA ) cream 1 Application, 1 Application, Topical, PRN, Schertz, Robert, MD   pentafluoroprop-tetrafluoroeth (GEBAUERS) aerosol 1 Application, 1 Application, Topical, PRN, Geralynn Charleston, MD   rosuvastatin  (CRESTOR ) tablet 10 mg, 10 mg, Oral, Daily, Jerri Pfeiffer, MD, 10 mg at 08/13/24 9077   Patients Current Diet:  Diet Order  DIET - DYS 1 Room service appropriate? 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 No   Prior Activity Level Community (5-7x/wk): Independent, not driving due ot basline visual issues, no AD   Prior Functional Level Self Care: Did the patient need help bathing, dressing, using the toilet or eating? Independent   Indoor Mobility: Did the patient need assistance with walking from room to room (with or without device)? Independent   Stairs: Did the patient need assistance with internal or external stairs (with or without device)? Independent   Functional Cognition: Did the patient need help planning regular tasks such as shopping or remembering to take medications? Independent   Patient Information Are you of Hispanic, Latino/a,or Spanish origin?: X. Patient unable to respond Gerianne answered; globally aphasic) What is your race?: Z. None of the above (per Kenneth) Do you need or want an interpreter to communicate with a doctor or health care staff?: 9. Unable to respond Gerianne answered yes) Patient information obtained via proxy : from partner, Kenneth   Patient's  Response To:  Health Literacy and Transportation Is the patient able to respond to health literacy and transportation needs?: No Health Literacy - How often do you need to have someone help you when you read instructions, pamphlets, or other written material from your doctor or pharmacy?: Patient unable to respond In the past 12 months, has lack of transportation kept you from medical appointments or from getting medications?: No In the past 12 months, has lack of transportation kept you from meetings, work, or from getting things needed for daily living?: No Health Literacy and Transportation obtained via proxy: per partner, Insurance Claims Handler / Equipment Home Equipment: None   Prior Device Use: Indicate devices/aids used by the patient prior to current illness, exacerbation or injury? None of the above   Current Functional Level Cognition   Overall Cognitive Status: Difficult to assess Orientation Level: Oriented to person, Oriented to place, Oriented to situation, Disoriented to time    Extremity Assessment (includes Sensation/Coordination)   Upper Extremity Assessment: RUE deficits/detail RUE Deficits / Details: able to range pt through full ROM,however when asked to incorporate RUE into tasks pt reports he cant but pt initated with pt putting RUE on RW and pushing up from EOB with RUE RUE Sensation: decreased light touch RUE Coordination: decreased fine motor, decreased gross motor  Lower Extremity Assessment: Defer to PT evaluation     ADLs   Overall ADL's : Needs assistance/impaired Eating/Feeding: Moderate assistance, Sitting Grooming: Wash/dry hands, Contact guard assist, Cueing for sequencing Grooming Details (indicate cue type and reason): pt able to stand at sink with CGA but needed max cues to sequene task, pt did not wash RUE despite max mutlimodal cues. Upper Body Bathing: Sitting, Maximal assistance Lower Body Bathing: Maximal assistance, Sit to/from  stand Lower Body Bathing Details (indicate cue type and reason): pt attempted posterior pericare after toileting but needed MAX A for cleanliness Upper Body Dressing : Minimal assistance, Sitting Upper Body Dressing Details (indicate cue type and reason): to don gown as back side cover Lower Body  Dressing: Maximal assistance, Sit to/from stand Toilet Transfer: Minimal assistance, Ambulation, Regular Toilet, Grab bars, Rolling walker (2 wheels) Toilet Transfer Details (indicate cue type and reason): MIN A for steadying assist and RW mgmt Toileting- Clothing Manipulation and Hygiene: Maximal assistance, Sit to/from stand Toileting - Clothing Manipulation Details (indicate cue type and reason): MAX A for cleanliness Functional mobility during ADLs: Minimal assistance, Rolling walker (2 wheels) General ADL Comments: ADL participation impacted by R sided inattention, apraxia, impaired motor planning, impaired baseline vision     Mobility   Overal bed mobility: Needs Assistance Bed Mobility: Supine to Sit, Sit to Supine Supine to sit: Contact guard, +2 for safety/equipment Sit to supine: Contact guard assist, +2 for safety/equipment General bed mobility comments: CGA for safety     Transfers   Overall transfer level: Needs assistance Equipment used: Rolling walker (2 wheels) Transfers: Sit to/from Stand Sit to Stand: Contact guard assist, +2 safety/equipment General transfer comment: CGA to rise from EOB, +2 for safety     Ambulation / Gait / Stairs / Wheelchair Mobility   Ambulation/Gait Ambulation/Gait assistance: Contact guard assist, Min assist, +2 safety/equipment Gait Distance (Feet): 150 Feet Assistive device: Rolling walker (2 wheels) Gait Pattern/deviations: Step-through pattern, Decreased step length - right, Decreased step length - left, Decreased stride length, Drifts right/left, Trunk flexed General Gait Details: trialed RW today for gait with pt noted to have extreme forward  posture and RW to far out in front of him. Constant cues for upright posture and proximity to RW however pt stated through in person interpreter that he couldnt do it. CGA/Min A overall. Gait velocity: decr Gait velocity interpretation: <1.31 ft/sec, indicative of household ambulator     Posture / Balance Balance Overall balance assessment: Needs assistance Sitting-balance support: No upper extremity supported, Feet unsupported Sitting balance-Leahy Scale: Fair Standing balance support: No upper extremity supported, During functional activity Standing balance-Leahy Scale: Fair Standing balance comment: CGA for safety     Special considerations/life events  [facility] MWF Required spanish interpreter    Previous Home Environment  Living Arrangements: Spouse/significant other (and 3 children, youngest 46 yo)  Lives With: Significant other, Family Available Help at Discharge:  (s/o works 6 pm until 9 pm, chidlren provide during those times) Type of Home: House Home Layout: One level Home Access: Stairs to enter Entrance Stairs-Rails: None Secretary/administrator of Steps: 2-3 Bathroom Shower/Tub: Associate Professor: Yes How Accessible: Accessible via walker Home Care Services: No   Discharge Living Setting Plans for Discharge Living Setting: Patient's home, House, Lives with (comment) (s/o and 3 children , 93 yo is the youngest) Type of Home at Discharge: House Discharge Home Layout: One level Discharge Home Access: Stairs to enter Entrance Stairs-Rails: None Entrance Stairs-Number of Steps: 2-3 Discharge Bathroom Shower/Tub: Tub/shower unit Discharge Bathroom Toilet: Standard Discharge Bathroom Accessibility: Yes How Accessible: Accessible via walker Does the patient have any problems obtaining your medications?: No   Patient and partner had court date 08/06/24 for foreclosure proceedings on their home. Corlis has extended  that date due to patient's admit. Partner states that a neighbor has agreed to allow she and patient to stay in a room at their home if needed, adult children would go stay with friends and family. She is hopeful that proceedings will be extended to March, so they can sell the house and get an apartment.   Social/Family/Support Systems Patient Roles: Partner, Parent Contact Information: significant other, Gladys. non english speaker;  Anticipated Caregiver: wife and 3 children Anticipated Caregiver's Contact Information: see contacts Ability/Limitations of Caregiver: wife works 6 pm until 9 pm daily; adult children can be with him at that time Caregiver Availability: 24/7 Discharge Plan Discussed with Primary Caregiver: Yes Is Caregiver In Agreement with Plan?: Yes Does Caregiver/Family have Issues with Lodging/Transportation while Pt is in Rehab?: No   Goals Patient/Family Goal for Rehab: supervision to min assist with PT, OT and SLP Expected length of stay: 7-10 days Cultural Considerations: Hispanic, needs interpreter Additional Information: Kenneth is his significant other, not married. Unable to get Medicaid for not a resident Pt/Family Agrees to Admission and willing to participate: Yes Program Orientation Provided & Reviewed with Pt/Caregiver Including Roles  & Responsibilities: Yes Additional Information Needs: in 2023 he was in an accident fell from a ladder 20 feet and was NWB to 3 extremitees. Family took him home, used product/process development scientist, catering manager and provided 24/7 care and to OP dialysis   Decrease burden of Care through IP rehab admission: n/a   Possible need for SNF placement upon discharge: not anticipated; no means for NF as self pay   Patient Condition: I have reviewed medical records from [facility], spoken with CM, and patient and spouse. I met with patient at the bedside for inpatient rehabilitation assessment.  Patient will benefit from ongoing PT, OT, and SLP, can actively  participate in 3 hours of therapy a day 5 days of the week, and can make measurable gains during the admission.  Patient will also benefit from the coordinated team approach during an Inpatient Acute Rehabilitation admission.  The patient will receive intensive therapy as well as Rehabilitation physician, nursing, social worker, and care management interventions.  Due to bladder management, bowel management, safety, skin/wound care, disease management, medication administration, pain management, and patient education the patient requires 24 hour a day rehabilitation nursing.  The patient is currently CGA-Min A with mobility and Min A-Max A with basic ADLs.  Discharge setting and therapy post discharge at home with outpatient is anticipated.  Patient has agreed to participate in the Acute Inpatient Rehabilitation Program and will admit today.   Preadmission Screen Completed By:  Alison Heron Lot, RN MSN with updates from Tinnie Yvone Cohens 08/14/2024 11:09 AM ______________________________________________________________________   Discussed status with Dr. Carilyn on 08/14/2024 at 11:09 AM and received approval for admission today.   Admission Coordinator:  Heron Lot Alison, RN MSN time 11:09 AM/Date 08/14/2024     Assessment/Plan: Diagnosis: Left MCA infarct Does the need for close, 24 hr/day Medical supervision in concert with the patient's rehab needs make it unreasonable for this patient to be served in a less intensive setting? Yes Co-Morbidities requiring supervision/potential complications: Aphasia, right hemiparesis, diabetes 2 uncontrolled, HTN uncontrolled, ESRD on HD Due to bladder management, bowel management, safety, skin/wound care, disease management, medication administration, pain management, and patient education, does the patient require 24 hr/day rehab nursing? Yes Does the patient require coordinated care of a physician, rehab nurse, PT, OT, and SLP to address physical  and functional deficits in the context of the above medical diagnosis(es)? Yes Addressing deficits in the following areas: balance, endurance, locomotion, strength, transferring, bowel/bladder control, bathing, dressing, feeding, grooming, toileting, cognition, speech, language, swallowing, and psychosocial support Can the patient actively participate in an intensive therapy program of at least 3 hrs of therapy 5 days a week? Yes The potential for patient to make measurable gains while on inpatient rehab is good Anticipated functional outcomes upon discharge from  inpatient rehab: supervision PT, min assist OT, modified independent and supervision SLP Estimated rehab length of stay to reach the above functional goals is: 7-10d Anticipated discharge destination: Home 10. Overall Rehab/Functional Prognosis: good     MD Signature: Prentice CHARLENA Compton M.D. [facility] Fellow Am Acad of Phys Med and Rehab Diplomate Am Board of Electrodiagnostic Med Fellow Am Board of Interventional Pain             Revision History  Date/Time User Provider Type Action  08/14/2024 12:11 PM Kirsteins, Prentice BRAVO, MD Physician Sign  08/14/2024 11:09 AM Yvone Delayne Tinnie SHAUNNA, CCC-SLP Rehab Admission Coordinator Share  08/14/2024 11:08 AM Yvone Delayne Tinnie SHAUNNA, CCC-SLP Rehab Admission Coordinator Share  08/14/2024 10:54 AM Yvone Delayne Tinnie SHAUNNA, CCC-SLP Rehab Admission Coordinator Share  08/10/2024  4:46 PM Alison Heron MATSU, RN Rehab Admission Coordinator Share   "

## 2024-08-14 NOTE — Progress Notes (Signed)
 SLP Cancellation Note  Patient Details Name: Blake Santana MRN: [id number] DOB: 03-07-1971   Cancelled treatment:       Reason Eval/Treat Not Completed: Patient at procedure or test/unavailable   Kalika Smay, Consuelo Fitch 08/14/2024, 9:22 AM

## 2024-08-14 NOTE — Progress Notes (Addendum)
 Rcd call from HD RN.  Report given.  IV ABX and AM meds held prior to HD.  Pt to HD at this time via bed with transport.  Pt and SO updated on POC via interpreter prior to transfer

## 2024-08-14 NOTE — Progress Notes (Signed)
 Physical Therapy Treatment Patient Details Name: Blake Santana MRN: [id number] DOB: [birth year] Today's Date: 08/14/2024   History of Present Illness Pt is 54 y.o. male presented to [facility] on 08/07/23 for aphasia. MRI showed acute lt posterior MCA infarct. PMH - ESRD on HD, DM, Diabetic retinopahty, HTN.    PT Comments  Pt with fair tolerance to treatment today. Co-treat with OT. Pt received shortly after dialysis. Pt today able to progress ambulation distance in hallway with RW CGA/Min A +2 for safety. Pt noted with L knee hyperextension and RUE buckling on RW with fatigue. Pt cued for L quad contraction however stated through interpreter that he cant. Pt also noted with difficulty path finding back to room. No change in DC/DME recs at this time. Pt anticipates DC to [facility] today. PT will continue to follow.     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 Increased time and repeated cues needed.    Transfers Overall transfer level: Needs assistance Equipment used: Rolling walker (2 wheels) Transfers: Sit to/from Stand Sit to  Stand: Contact guard assist, +2 safety/equipment           General transfer comment: CGA to rise from EOB, +2 for safety. Cues for hand placement however pt still pushes up on RW.    Ambulation/Gait Ambulation/Gait assistance: Contact guard assist, Min assist, +2 safety/equipment, +2 physical assistance Gait Distance (Feet): 200 Feet Assistive device: Rolling walker (2 wheels) Gait Pattern/deviations: Step-through pattern, Decreased step length - right, Decreased step length - left, Decreased stride length, Drifts right/left, Trunk flexed, Knee hyperextension - left Gait velocity: decr     General Gait Details: Pt today able to progress ambulation distance in hallway with RW CGA/Min A +2 for safety. Pt noted with L knee hyperextension and RUE buckling on RW with fatigue. Pt cued for L quad contraction however stated through interpreter that he cant. Pt also noted with difficulty path finding back to room.   Stairs             Wheelchair Mobility     Tilt Bed    Modified Rankin (Stroke Patients Only) Modified Rankin (Stroke Patients Only) Pre-Morbid Rankin Score: Slight disability Modified Rankin: Moderately severe disability     Balance Overall balance assessment: Needs assistance Sitting-balance support: No upper extremity supported, Feet unsupported Sitting balance-Leahy Scale: Fair     Standing balance support: No upper extremity supported, During functional activity Standing balance-Leahy Scale: Fair Standing balance comment: CGA for safety                            Communication Communication Communication: Impaired Factors Affecting Communication: Difficulty expressing self;Non - English speaking, interpreter  not available  Cognition Arousal: Alert Behavior During Therapy: WFL for tasks assessed/performed, Flat affect   PT - Cognitive impairments: Difficult to assess Difficult to assess due to: Impaired communication, Non-English speaking                      PT - Cognition Comments: Pt aphasic. Spanish speaking. Used in person interpreter Raquel. Following commands: Impaired Following commands impaired: Follows one step commands inconsistently, Follows one step commands with increased time    Cueing Cueing Techniques: Verbal cues, Gestural cues, Tactile cues, Visual cues  Exercises      General Comments General comments (skin integrity, edema, etc.): VSS      Pertinent Vitals/Pain Pain Assessment Pain Assessment: Faces Faces Pain Scale: Hurts little more Pain Location: toothache and grimacing when OT checked RUE ROM Pain Descriptors / Indicators: Grimacing Pain Intervention(s): Monitored during session, Limited activity within patient's tolerance, RN gave pain meds during session, Repositioned    Home Living                          Prior Function            PT Goals (current goals can now be found in the care plan section) Progress towards PT goals: Progressing toward goals    Frequency    Min 2X/week      PT Plan      Co-evaluation              AM-PAC PT 6 Clicks Mobility   Outcome Measure  Help needed turning from your back to your side while in a flat bed without using bedrails?: A Little Help needed moving from lying on your back to sitting on the side of a flat bed without using bedrails?: A Little Help needed moving to and from a bed to a chair (including a wheelchair)?: A Little Help needed standing up from a chair using your arms (e.g., wheelchair or bedside chair)?: A Little Help needed to walk in hospital room?: A Lot Help needed climbing 3-5 steps with a railing? : Total 6 Click Score: 15    End of Session Equipment Utilized During Treatment: Gait belt Activity Tolerance: Patient tolerated treatment well Patient left: in bed;with call bell/phone within reach;with bed alarm set Nurse Communication: Mobility status PT Visit Diagnosis: Unsteadiness on feet  (R26.81);Other abnormalities of gait and mobility (R26.89);Muscle weakness (generalized) (M62.81)     Time: 8588-8561 PT Time Calculation (min) (ACUTE ONLY): 27 min  Charges:    $Gait Training: 8-22 mins PT General Charges $$ ACUTE PT VISIT: 1 Visit                     Suhas Estis B, PT, DPT Acute Rehab Services [phone]    Hershy Flenner 08/14/2024, 4:00 PM

## 2024-08-14 NOTE — Progress Notes (Signed)
 OT Cancellation Note  Patient Details Name: Blake Santana MRN: [id number] DOB: Nov 19, 1970   Cancelled Treatment:    Reason Eval/Treat Not Completed: Patient at procedure or test/ unavailable. Will return as OT schedule allows.   [name], OTR/L [facility] Acute Rehabilitation Office: [phone]   [name] 08/14/2024, 9:22 AM

## 2024-08-14 NOTE — Progress Notes (Signed)
 PT Cancellation Note  Patient Details Name: Blake Santana MRN: [id number] DOB: Feb 15, 1971   Cancelled Treatment:    Reason Eval/Treat Not Completed: Patient at procedure or test/unavailable (Pt off the floor at HD. Will follow up later if time allows.)   Ameris Akamine 08/14/2024, 9:27 AM

## 2024-08-14 NOTE — H&P (Signed)
 Physical Medicine and Rehabilitation Admission H&P        Chief Complaint  Patient presents with   Functional deficits due to CVA    HPI: Blake Santana is a 54 year old male with PMHx ESRD on HD TTS, HFpEF, DM2 with diabetic retinopathy, anemia of chronic disease, HTN, HLD, PAD who presented to [facility] on 08/06/24 with difficulty speaking and choking with dinner the night prior. Slurred speech and left sided facial numbness returned around noon. In the BP 231/94 and labs revealed Na 134, K 6.4, BUN 50, cr 1036, Ca 8.2, alb 4.1 and WBC 8 K. NIHSS 7. CT head showed focal cortical and subcortical hypodensity at the left temporoparietal lobe suspicious for acute or subacute infarct. Neurology consulted and patient underwent MRI showed an acute left posterior MCA territory infarct involving the left anterior temporal lobe, overlying parietal lobe and posterior left insula, some edema without mass effect.  CTA showed left M2 MCA occlusion, severe PCA P2 stenosis.  Patient reported right upper extremity weakness on 1/6 therefore underwent repeat MRI which showed increase edema and slight midline shift.  Neurology recommended continuation of DAPT antithrombotic asprin and plavix  x 21 days followed by aspirin  monotherapy. A right shoulder x-ray was negative. Nephrology was consulted for management of ESRD and hyperkalemia.  Panorex was ordered due to complaints of dental pain and noted reabsorption of maxilla and prominent periapical erosions involving tooth 1, advanced peridental disease.  Symptoms have improved with a course of Unasyn .  Patient discharged on Augmentin  to complete 7 days.  He will need to follow-up outpatient for dental care.     Per chart review the patient lives in a 1 level home with 2-3 steps to enter with his spouse.  Prior to arrival he was independent but did not drive at baseline due to visual issues.  He currently requires CGA to min assist with mobility and min  assist to max assist with basic ADLs.Therapy evaluations completed due to patient decreased functional mobility was admitted for a comprehensive rehab program.       ROS cannot obtain due to severe aphasia               Past Medical History:  Diagnosis Date   Acute respiratory failure with hypoxia (HCC) 09/07/2018   Anemia of chronic kidney failure 03/11/2017   Cataracts, bilateral 2019   Chest pain 09/01/2018    minimal luminal irregularities   Chronic diastolic CHF (congestive heart failure) (HCC)     Chronic kidney disease 03/11/2017    Dialysis Tu/Th/Sa   COVID 2022    hospitalized   Diabetes mellitus without complication (HCC) 1999   Edema 01/14/2017   Headache     Hypercholesterolemia     Hypertension 03/11/2017   Hypertensive retinopathy of both eyes 2019   Iron  deficiency anemia 06/18/2016   Pneumonia 09/07/2018   Proliferative diabetic retinopathy with macular edema (HCC) 2019    Retinal hemorrhage, bilateral and left vitreous hemorrhage.  Being treated with Lucentis.  Dr. Raj   RSV (respiratory syncytial virus infection) 08/2017             Past Surgical History:  Procedure Laterality Date   A/V FISTULAGRAM Left 01/19/2018    Procedure: A/V FISTULAGRAM - Left AV;  Surgeon: Sheree Penne Bruckner, MD;  Location: [facility] INVASIVE CV LAB;  Service: Cardiovascular;  Laterality: Left;   AV FISTULA PLACEMENT Left 08/09/2017    Procedure: LEFT RADIOCEPHALIC ARTERIOVENOUS (AV) FISTULA CREATION;  Surgeon:  Oris Krystal FALCON, MD;  [facility];  Service: Vascular;  Laterality: Left;   AV FISTULA PLACEMENT Left 02/02/2018    Procedure: ARTERIOVENOUS (AV) FISTULA CREATION LEFT UPPER EXTREMITY;  Surgeon: Oris Krystal FALCON, MD;  Location: [facility] OR;  Service: Vascular;  Laterality: Left;   INSERTION OF DIALYSIS CATHETER   08/09/2017   INSERTION OF DIALYSIS CATHETER Right 08/09/2017    Procedure: INSERTION OF DIALYSIS CATHETER RIGHT INTERNAL JUGULAR;  Surgeon: Oris Krystal FALCON, MD;  Location:  [facility] OR;  Service: Vascular;  Laterality: Right;   IR DIALY SHUNT INTRO NEEDLE/INTRACATH INITIAL W/IMG LEFT Left 07/10/2020   IR DIALY SHUNT INTRO NEEDLE/INTRACATH INITIAL W/IMG LEFT Left 04/14/2021   IR REMOVAL TUN CV CATH W/O FL   07/01/2018   LEFT HEART CATH AND CORONARY ANGIOGRAPHY N/A 09/05/2018    Procedure: LEFT HEART CATH AND CORONARY ANGIOGRAPHY;  Surgeon: Dann Candyce RAMAN, MD;  Location: [facility] INVASIVE CV LAB;  Service: Cardiovascular;  Laterality: N/A;   OPEN REDUCTION INTERNAL FIXATION (ORIF) DISTAL RADIAL FRACTURE Left 11/20/2021    Procedure: POSSIBLE OPEN REDUCTION INTERNAL FIXATION (ORIF) DISTAL RADIAL FRACTURE;  Surgeon: Kendal Franky SQUIBB, MD;  Location: [facility] OR;  Service: Orthopedics;  Laterality: Left;   ORIF ANKLE FRACTURE Left 11/20/2021    Procedure: OPEN REDUCTION INTERNAL FIXATION (ORIF) ANKLE PILON FRACTURE;  Surgeon: Kendal Franky SQUIBB, MD;  Location: [facility] OR;  Service: Orthopedics;  Laterality: Left;   ORIF TIBIA PLATEAU Right 11/20/2021    Procedure: OPEN REDUCTION INTERNAL FIXATION (ORIF) TIBIAL PLATEAU;  Surgeon: Kendal Franky SQUIBB, MD;  Location: [facility] OR;  Service: Orthopedics;  Laterality: Right;   PARATHYROIDECTOMY N/A 10/26/2022    Procedure: total PARATHYROIDECTOMY;  Surgeon: Eletha Krystal, MD;  Location[facility] OR;  Service: General;  Laterality: N/A;   PARATHYROIDECTOMY Right 10/26/2022    Procedure: AUTOTRANSPLANT TO RIGHT FOREARM;  Surgeon: Eletha Krystal, MD;  Location: [facility] OR;  Service: General;  Laterality: Right;             Family History  Problem Relation Age of Onset   Diabetes Mother     Kidney disease Mother          renal failure--diabetes, cause of death   Hypertension Mother     Diabetes Father     Kidney disease Father          Cause of death:  kidney failure form diabetes   Alcohol abuse Father     Diabetes Sister          Social History:  reports that he has quit smoking. His smoking use included cigarettes. He has been exposed to tobacco smoke. He has never  used smokeless tobacco. He reports that he does not currently use alcohol. He reports that he does not use drugs. Allergies: [Allergies]  [Allergies] No Known Allergies       Medications Prior to Admission  Medication Sig Dispense Refill   acetaminophen  (TYLENOL ) 500 MG tablet Take 1,000 mg by mouth every 6 (six) hours as needed for moderate pain or headache.       AgaMatrix Ultra-Thin Lancets MISC Check blood glucose twice daily before meals. 100 each 11   amLODipine  (NORVASC ) 10 MG tablet Take 1 tablet (10 mg total) by mouth daily. (Patient not taking: Reported on 08/07/2024) 90 tablet 3   aspirin -acetaminophen -caffeine (EXCEDRIN MIGRAINE) 250-250-65 MG tablet Take 1-2 tablets by mouth every 6 (six) hours as needed for headache.       calcitRIOL  (ROCALTROL ) 0.5 MCG capsule Take 6 capsules (  3 mcg total) by mouth 2 (two) times daily. 60 capsule 4   calcium  carbonate (TUMS - DOSED IN MG ELEMENTAL CALCIUM ) 500 MG chewable tablet Chew 5 tablets (1,000 mg of elemental calcium  total) by mouth 4 (four) times daily. 120 tablet 2   carvedilol  (COREG ) 12.5 MG tablet Take 1 tablet (12.5 mg total) by mouth 2 (two) times daily. 180 tablet 3   ezetimibe  (ZETIA ) 10 MG tablet Take 1 tablet (10 mg total) by mouth daily. (Patient not taking: Reported on 02/29/2024) 90 tablet 3   glipiZIDE  (GLUCOTROL ) 10 MG tablet Take 1 tablet (10 mg total) by mouth daily before breakfast. 90 tablet 3   glucose blood (TRUE METRIX BLOOD GLUCOSE TEST) test strip Use as instructed 100 each 12   lanthanum  (FOSRENOL ) 1000 MG chewable tablet Chew 1 tablet (1,000 mg total) by mouth 2 (two) times daily with a meal. 180 tablet 3   rosuvastatin  (CRESTOR ) 10 MG tablet Take 1 tablet (10 mg total) by mouth daily. 90 tablet 3   TRUEplus Lancets 28G MISC Use as directed 100 each 4              Home: Home Living Family/patient expects to be discharged to:: Private residence Living Arrangements: Spouse/significant other (and 3 children,  youngest 25 yo) Available Help at Discharge:  (s/o works 6 pm until 9 pm, chidlren provide during those times) Type of Home: House Home Access: Stairs to enter [employer] of Steps: 2-3 Entrance Stairs-Rails: None Home Layout: One level Bathroom Shower/Tub: Associate Professor: Yes Home Equipment: None  Lives With: Significant other, Family   Functional History: Prior Function Prior Level of Function : Independent/Modified Independent Mobility Comments: No assistive device ADLs Comments: independent with BADL; does not drive or cook secondary to L eye visual deficits at baseline.   Functional Status:  Mobility: Bed Mobility Overal bed mobility: Needs Assistance Bed Mobility: Supine to Sit, Sit to Supine Supine to sit: Contact guard, +2 for safety/equipment Sit to supine: Contact guard assist, +2 for safety/equipment General bed mobility comments: CGA for safety Transfers Overall transfer level: Needs assistance Equipment used: Rolling walker (2 wheels) Transfers: Sit to/from Stand Sit to Stand: Contact guard assist, +2 safety/equipment General transfer comment: CGA to rise from EOB, +2 for safety Ambulation/Gait Ambulation/Gait assistance: Contact guard assist, Min assist, +2 safety/equipment Gait Distance (Feet): 150 Feet Assistive device: Rolling walker (2 wheels) Gait Pattern/deviations: Step-through pattern, Decreased step length - right, Decreased step length - left, Decreased stride length, Drifts right/left, Trunk flexed General Gait Details: trialed RW today for gait with pt noted to have extreme forward posture and RW to far out in front of him. Constant cues for upright posture and proximity to RW however pt stated through in person interpreter that he couldnt do it. CGA/Min A overall. Gait velocity: decr Gait velocity interpretation: <1.31 ft/sec, indicative of household ambulator   ADL: ADL Overall ADL's :  Needs assistance/impaired Eating/Feeding: Moderate assistance, Sitting Grooming: Wash/dry hands, Contact guard assist, Cueing for sequencing Grooming Details (indicate cue type and reason): pt able to stand at sink with CGA but needed max cues to sequene task, pt did not wash RUE despite max mutlimodal cues. Upper Body Bathing: Sitting, Maximal assistance Lower Body Bathing: Maximal assistance, Sit to/from stand Lower Body Bathing Details (indicate cue type and reason): pt attempted posterior pericare after toileting but needed MAX A for cleanliness Upper Body Dressing : Minimal assistance, Sitting Upper Body Dressing Details (indicate cue  type and reason): to don gown as back side cover Lower Body Dressing: Maximal assistance, Sit to/from stand Toilet Transfer: Minimal assistance, Ambulation, Regular Toilet, Grab bars, Rolling walker (2 wheels) Toilet Transfer Details (indicate cue type and reason): MIN A for steadying assist and RW mgmt Toileting- Clothing Manipulation and Hygiene: Maximal assistance, Sit to/from stand Toileting - Clothing Manipulation Details (indicate cue type and reason): MAX A for cleanliness Functional mobility during ADLs: Minimal assistance, Rolling walker (2 wheels) General ADL Comments: ADL participation impacted by R sided inattention, apraxia, impaired motor planning, impaired baseline vision   Cognition: Cognition Overall Cognitive Status: Difficult to assess Orientation Level: Oriented to person, Oriented to place, Oriented to situation, Disoriented to time Cognition Arousal: Alert Behavior During Therapy: [facility] for tasks assessed/performed, Flat affect Overall Cognitive Status: Difficult to assess   Physical Exam: Blood pressure (!) 181/89, pulse 68, temperature 98.1 F (36.7 C), resp. rate 17, height 5' 2 (1.575 m), weight 66.7 kg, SpO2 99%. Physical Exam  General: No acute distress Mood and affect are appropriate Heart: Regular rate and rhythm no rubs  murmurs or extra sounds Lungs: Clear to auscultation, breathing unlabored, no rales or wheezes Abdomen: Positive bowel sounds, soft nontender to palpation, nondistended Extremities: No clubbing, cyanosis, or edema Skin: No evidence of breakdown, no evidence of rash Neurologic: Cranial nerves II through XII intact, motor strength is 5/5 in left and 2-/5 RIght  deltoid, bicep, tricep, grip, hip flexor, knee extensors, ankle dorsiflexor and plantar flexor- testing limited by aphasia and apraxia Sensory exam winces to pinch on fingers and toes, limited by aphasia  Cerebellar exam could not perform on RIght due to weakness Musculoskeletal: no pain with MMT No joint swelling Lab Results Last 48 Hours        Results for orders placed or performed during the hospital encounter of 08/06/24 (from the past 48 hours)  Glucose, capillary     Status: Abnormal    Collection Time: 08/12/24  4:33 PM  Result Value Ref Range    Glucose-Capillary 217 (H) 70 - 99 mg/dL      Comment: Glucose reference range applies only to samples taken after fasting for at least 8 hours.    Comment 1 Notify RN    Glucose, capillary     Status: Abnormal    Collection Time: 08/12/24  9:59 PM  Result Value Ref Range    Glucose-Capillary 153 (H) 70 - 99 mg/dL      Comment: Glucose reference range applies only to samples taken after fasting for at least 8 hours.    Comment 1 Notify RN      Comment 2 Document in Chart    Basic metabolic panel     Status: Abnormal    Collection Time: 08/13/24  1:36 AM  Result Value Ref Range    Sodium 134 (L) 135 - 145 mmol/L    Potassium 4.5 3.5 - 5.1 mmol/L    Chloride 92 (L) 98 - 111 mmol/L    CO2 28 22 - 32 mmol/L    Glucose, Bld 106 (H) 70 - 99 mg/dL      Comment: Glucose reference range applies only to samples taken after fasting for at least 8 hours.    BUN 35 (H) 6 - 20 mg/dL    Creatinine, Ser 1.34 (H) 0.61 - 1.24 mg/dL    Calcium  7.4 (L) 8.9 - 10.3 mg/dL    GFR, Estimated 7 (L)  >60 mL/min      Comment: (NOTE)  Calculated using the CKD-EPI Creatinine Equation (2021)      Anion gap 15 5 - 15      Comment: Performed at [facility], [address] [place], [place] [address]  Glucose, capillary     Status: Abnormal    Collection Time: 08/13/24  6:26 AM  Result Value Ref Range    Glucose-Capillary 121 (H) 70 - 99 mg/dL      Comment: Glucose reference range applies only to samples taken after fasting for at least 8 hours.  Glucose, capillary     Status: Abnormal    Collection Time: 08/13/24 11:12 AM  Result Value Ref Range    Glucose-Capillary 149 (H) 70 - 99 mg/dL      Comment: Glucose reference range applies only to samples taken after fasting for at least 8 hours.  Glucose, capillary     Status: Abnormal    Collection Time: 08/13/24  5:03 PM  Result Value Ref Range    Glucose-Capillary 173 (H) 70 - 99 mg/dL      Comment: Glucose reference range applies only to samples taken after fasting for at least 8 hours.  Glucose, capillary     Status: Abnormal    Collection Time: 08/13/24  9:30 PM  Result Value Ref Range    Glucose-Capillary 170 (H) 70 - 99 mg/dL      Comment: Glucose reference range applies only to samples taken after fasting for at least 8 hours.  CBC with Differential/Platelet     Status: Abnormal    Collection Time: 08/14/24  2:39 AM  Result Value Ref Range    WBC 7.6 4.0 - 10.5 K/uL    RBC 3.43 (L) 4.22 - 5.81 MIL/uL    Hemoglobin 10.7 (L) 13.0 - 17.0 g/dL    HCT 67.3 (L) 60.9 - 52.0 %    MCV 95.0 80.0 - 100.0 fL    MCH 31.2 26.0 - 34.0 pg    MCHC 32.8 30.0 - 36.0 g/dL    RDW 87.0 88.4 - 84.4 %    Platelets 146 (L) 150 - 400 K/uL    nRBC 0.0 0.0 - 0.2 %    Neutrophils Relative % 57 %    Neutro Abs 4.2 1.7 - 7.7 K/uL    Lymphocytes Relative 24 %    Lymphs Abs 1.8 0.7 - 4.0 K/uL    Monocytes Relative 10 %    Monocytes Absolute 0.8 0.1 - 1.0 K/uL    Eosinophils Relative 8 %    Eosinophils Absolute 0.6 (H) 0.0 - 0.5 K/uL    Basophils  Relative 1 %    Basophils Absolute 0.1 0.0 - 0.1 K/uL    Immature Granulocytes 0 %    Abs Immature Granulocytes 0.03 0.00 - 0.07 K/uL      Comment: Performed at [facility], [address] [place], [place] [address]  Basic metabolic panel with GFR     Status: Abnormal    Collection Time: 08/14/24  2:39 AM  Result Value Ref Range    Sodium 135 135 - 145 mmol/L    Potassium 4.9 3.5 - 5.1 mmol/L    Chloride 93 (L) 98 - 111 mmol/L    CO2 26 22 - 32 mmol/L    Glucose, Bld 134 (H) 70 - 99 mg/dL      Comment: Glucose reference range applies only to samples taken after fasting for at least 8 hours.    BUN 50 (H) 6 - 20 mg/dL  Creatinine, Ser 10.30 (H) 0.61 - 1.24 mg/dL    Calcium  7.1 (L) 8.9 - 10.3 mg/dL    GFR, Estimated 5 (L) >60 mL/min      Comment: (NOTE) Calculated using the CKD-EPI Creatinine Equation (2021)      Anion gap 16 (H) 5 - 15      Comment: Performed at [facility], [address], [place], [place] [address]  Magnesium     Status: Abnormal    Collection Time: 08/14/24  2:39 AM  Result Value Ref Range    Magnesium 2.5 (H) 1.7 - 2.4 mg/dL      Comment: Performed at [facility], [address], [place], [place] [address]  Glucose, capillary     Status: Abnormal    Collection Time: 08/14/24  6:28 AM  Result Value Ref Range    Glucose-Capillary 107 (H) 70 - 99 mg/dL      Comment: Glucose reference range applies only to samples taken after fasting for at least 8 hours.      Imaging Results (Last 48 hours)  No results found.         Blood pressure (!) 181/89, pulse 68, temperature 98.1 F (36.7 C), resp. rate 17, height 5' 2 (1.575 m), weight 66.7 kg, SpO2 99%.   Medical Problem List and Plan: 1. Functional deficits secondary to Left MCA infarct             -patient may  shower             -ELOS/Goals: 7-10d , Sup-MinA goals   2.  Antithrombotics: -DVT/anticoagulation:  Mechanical: Sequential compression devices, below knee Bilateral lower  extremities Pharmaceutical: Heparin              -antiplatelet therapy: Aspirin  and Plavix  for 3 months, then followed by aspirin  alone.   3. Pain Management: Tylenol  prn    4. Mood/Behavior/Sleep: LCSW to follow for evaluation and support when available.              -antipsychotic agents: n/a             -sleep: Trazadone prn    5. Neuropsych/cognition: This patient is not capable of making decisions on his  own behalf.   6. Skin/Wound Care: Routine pressure relief measures.    7. Fluids/Electrolytes/Nutrition: Monitor I&O and weight. Follow up labs monitored by HD.    8.  Acute CVA: Continue DAPT aspirin  and Plavix  for 3 months, then followed by aspirin  alone.  Continue Crestor  and Zetia .  Follow-up outpatient with cardiology for heart monitor.   9.  ESRD: On HD TTS, nephrology following.   10.  Chronic HFpEF: Continue fluid removal per HD.    11.  Type 2 diabetes: A1c 8.5, poorly controlled.  Used glipizide  10 mg at home. -Monitor glucose ACHS with SSI NovoLog .   12.  Anemia of chronic disease: Hemoglobin 10.7.  Monitor CBC.    13.  Hyperlipidemia/CAD: Continue tatin, zetia , aspirin  and plavix .    14.  Dental pain: Received IV Unasyn  transition Continue Augmentin  x 4 days (confirming with hospitalist).        Daphne LOISE Satterfield, NP 08/14/2024 I have personally performed a face to face diagnostic evaluation of this patient.  Additionally, I have reviewed and concur with the physician assistant's documentation above. Prentice CHARLENA Compton M.D. [facility] Fellow Am Acad of Phys Med and Rehab Diplomate Am Board of Electrodiagnostic Med Fellow Am Board of Interventional Pain

## 2024-08-15 DIAGNOSIS — Z992 Dependence on renal dialysis: Secondary | ICD-10-CM

## 2024-08-15 DIAGNOSIS — N186 End stage renal disease: Secondary | ICD-10-CM

## 2024-08-15 DIAGNOSIS — I69390 Apraxia following cerebral infarction: Secondary | ICD-10-CM

## 2024-08-15 DIAGNOSIS — I63312 Cerebral infarction due to thrombosis of left middle cerebral artery: Secondary | ICD-10-CM

## 2024-08-15 DIAGNOSIS — R4701 Aphasia: Secondary | ICD-10-CM

## 2024-08-15 LAB — GLUCOSE, CAPILLARY
Glucose-Capillary: 106 mg/dL — ABNORMAL HIGH (ref 70–99)
Glucose-Capillary: 203 mg/dL — ABNORMAL HIGH (ref 70–99)
Glucose-Capillary: 243 mg/dL — ABNORMAL HIGH (ref 70–99)
Glucose-Capillary: 175 mg/dL — ABNORMAL HIGH (ref 70–99)

## 2024-08-15 LAB — HEPATITIS B SURFACE ANTIGEN: Hepatitis B Surface Ag: NONREACTIVE

## 2024-08-15 LAB — MAGNESIUM: Magnesium: 2.4 mg/dL (ref 1.7–2.4)

## 2024-08-15 MED ORDER — CHLORHEXIDINE GLUCONATE CLOTH 2 % EX PADS: 6.0000 | MEDICATED_PAD | Freq: Every day | CUTANEOUS | Status: DC

## 2024-08-15 NOTE — Evaluation (Signed)
 Physical Therapy Assessment and Plan  Patient Details  Name: Blake Santana MRN: [id number] Date of Birth: 02/27/1971  PT Diagnosis: Abnormal posture, Abnormality of gait, Cognitive deficits, Coordination disorder, Difficulty walking, Hemiparesis dominant, Impaired sensation, Muscle weakness, and Pain in RUE Rehab Potential: Good ELOS: 7-10 days   Today's Date: 08/15/2024 PT Individual Time: 8953-8843 PT Individual Time Calculation (min): 70 min    Hospital Problem: Principal Problem:   CVA (cerebral vascular accident) [facility])   Past Medical History:  Past Medical History:  Diagnosis Date   Acute respiratory failure with hypoxia (HCC) 09/07/2018   Anemia of chronic kidney failure 03/11/2017   Cataracts, bilateral 2019   Chest pain 09/01/2018   minimal luminal irregularities   Chronic diastolic CHF (congestive heart failure) (HCC)    Chronic kidney disease 03/11/2017   Dialysis Tu/Th/Sa   COVID 2022   hospitalized   Diabetes mellitus without complication (HCC) 1999   Edema 01/14/2017   Headache    Hypercholesterolemia    Hypertension 03/11/2017   Hypertensive retinopathy of both eyes 2019   Iron  deficiency anemia 06/18/2016   Pneumonia 09/07/2018   Proliferative diabetic retinopathy with macular edema (HCC) 2019   Retinal hemorrhage, bilateral and left vitreous hemorrhage.  Being treated with Lucentis.  Dr. Raj   RSV (respiratory syncytial virus infection) 08/2017   Past Surgical History:  Past Surgical History:  Procedure Laterality Date   A/V FISTULAGRAM Left 01/19/2018   Procedure: A/V FISTULAGRAM - Left AV;  Surgeon: Sheree Penne Bruckner, MD;  Location: [facility] INVASIVE CV LAB;  Service: Cardiovascular;  Laterality: Left;   AV FISTULA PLACEMENT Left 08/09/2017   Procedure: LEFT RADIOCEPHALIC ARTERIOVENOUS (AV) FISTULA CREATION;  Surgeon: Oris Krystal FALCON, MD;  Location: [facility] OR;  Service: Vascular;  Laterality: Left;   AV FISTULA PLACEMENT Left 02/02/2018    Procedure: ARTERIOVENOUS (AV) FISTULA CREATION LEFT UPPER EXTREMITY;  Surgeon: Oris Krystal FALCON, MD;  Location: [facility] OR;  Service: Vascular;  Laterality: Left;   INSERTION OF DIALYSIS CATHETER  08/09/2017   INSERTION OF DIALYSIS CATHETER Right 08/09/2017   Procedure: INSERTION OF DIALYSIS CATHETER RIGHT INTERNAL JUGULAR;  Surgeon: Oris Krystal FALCON, MD;  Location: [facility] OR;  Service: Vascular;  Laterality: Right;   IR DIALY SHUNT INTRO NEEDLE/INTRACATH INITIAL W/IMG LEFT Left 07/10/2020   IR DIALY SHUNT INTRO NEEDLE/INTRACATH INITIAL W/IMG LEFT Left 04/14/2021   IR REMOVAL TUN CV CATH W/O FL  07/01/2018   LEFT HEART CATH AND CORONARY ANGIOGRAPHY N/A 09/05/2018   Procedure: LEFT HEART CATH AND CORONARY ANGIOGRAPHY;  Surgeon: Dann Candyce RAMAN, MD;  Location: [facility] INVASIVE CV LAB;  Service: Cardiovascular;  Laterality: N/A;   OPEN REDUCTION INTERNAL FIXATION (ORIF) DISTAL RADIAL FRACTURE Left 11/20/2021   Procedure: POSSIBLE OPEN REDUCTION INTERNAL FIXATION (ORIF) DISTAL RADIAL FRACTURE;  Surgeon: Kendal Franky SQUIBB, MD;  Location: [facility] OR;  Service: Orthopedics;  Laterality: Left;   ORIF ANKLE FRACTURE Left 11/20/2021   Procedure: OPEN REDUCTION INTERNAL FIXATION (ORIF) ANKLE PILON FRACTURE;  Surgeon: Kendal Franky SQUIBB, MD;  Location: [facility] OR;  Service: Orthopedics;  Laterality: Left;   ORIF TIBIA PLATEAU Right 11/20/2021   Procedure: OPEN REDUCTION INTERNAL FIXATION (ORIF) TIBIAL PLATEAU;  Surgeon: Kendal Franky SQUIBB, MD;  Location: [facility] OR;  Service: Orthopedics;  Laterality: Right;   PARATHYROIDECTOMY N/A 10/26/2022   Procedure: total PARATHYROIDECTOMY;  Surgeon: Eletha Krystal, MD;  Location: [facility] OR;  Service: General;  Laterality: N/A;   PARATHYROIDECTOMY Right 10/26/2022   Procedure: AUTOTRANSPLANT TO RIGHT FOREARM;  Surgeon: Eletha Krystal,  MD;  Location: [facility] OR;  Service: General;  Laterality: Right;    Assessment & Plan Clinical Impression: Patient is a 54 y.o. year old male with PMHx ESRD on HD TTS, HFpEF, DM2 with diabetic  retinopathy, anemia of chronic disease, HTN, HLD, PAD who presented to [facility] on 08/06[facility]24 with difficulty speaking and choking with dinner the night prior. Slurred speech and left sided facial numbness returned around noon. In the BP 231/94 and labs revealed Na 134, K 6.4, BUN 50, cr 1036, Ca 8.2, alb 4.1 and WBC 8 K. NIHSS 7. CT head showed focal cortical and subcortical hypodensity at the left temporoparietal lobe suspicious for acute or subacute infarct. Neurology consulted and patient underwent MRI showed an acute left posterior MCA territory infarct involving the left anterior temporal lobe, overlying parietal lobe and posterior left insula, some edema without mass effect.  CTA showed left M2 MCA occlusion, severe PCA P2 stenosis.  Patient reported right upper extremity weakness on 1/6 therefore underwent repeat MRI which showed increase edema and slight midline shift.  Neurology recommended continuation of DAPT antithrombotic asprin and plavix  x 21 days followed by aspirin  monotherapy. A right shoulder x-ray was negative. Nephrology was consulted for management of ESRD and hyperkalemia.  Panorex was ordered due to complaints of dental pain and noted reabsorption of maxilla and prominent periapical erosions involving tooth 1, advanced peridental disease.  Symptoms have improved with a course of Unasyn .  Patient discharged on Augmentin  to complete 7 days.  He will need to follow-up outpatient for dental care.     Per chart review the patient lives in a 1 level home with 2-3 steps to enter with his spouse.  Prior to arrival he was independent but did not drive at baseline due to visual issues.  He currently requires CGA to min assist with mobility and min assist to max assist with basic ADLs.Therapy evaluations completed due to patient decreased functional mobility was admitted for a comprehensive rehab program.  Patient currently requires min with mobility secondary to muscle weakness, decreased  cardiorespiratoy endurance, impaired timing and sequencing, abnormal tone, unbalanced muscle activation, motor apraxia, decreased coordination, and decreased motor planning, decreased visual acuity, decreased visual perceptual skills, and decreased visual motor skills, decreased attention to right, decreased attention, decreased awareness, decreased problem solving, and decreased safety awareness, and decreased standing balance, decreased postural control, hemiplegia, and decreased balance strategies.  Prior to hospitalization, patient was independent  with mobility and lived with Significant other, Family (3 children) in a House home.  Home access is 2-3Stairs to enter.  Patient will benefit from skilled PT intervention to maximize safe functional mobility, minimize fall risk, and decrease caregiver burden for planned discharge home with 24 hour supervision.  Anticipate patient will benefit from follow up OP at discharge.  PT - End of Session Activity Tolerance: Tolerates 30+ min activity with multiple rests Endurance Deficit: Yes Endurance Deficit Description: required rest breaks throughout PT Assessment Rehab Potential (ACUTE/IP ONLY): Good PT Barriers to Discharge: Home environment access/layout;Hemodialysis;Behavior;Other (comments) PT Barriers to Discharge Comments: aphasia, apraxia, hemiparesis, decreased balance/coordination, impaired motor planning/sequencing PT Patient demonstrates impairments in the following area(s): Balance;Endurance;Motor;Perception;Safety;Sensory;Skin Integrity;Pain PT Transfers Functional Problem(s): Bed Mobility;Bed to Chair;Car;Furniture;Floor PT Locomotion Functional Problem(s): Ambulation;Wheelchair Mobility;Stairs PT Plan PT Intensity: Minimum of 1-2 x/day ,45 to 90 minutes PT Frequency: 5 out of 7 days PT Duration Estimated Length of Stay: 7-10 days PT Treatment/Interventions: Ambulation/gait training;Discharge planning;Functional mobility  training;Psychosocial support;Therapeutic Activities;Visual/perceptual remediation/compensation;Balance/vestibular training;Disease management/prevention;Neuromuscular re-education;Skin care/wound management;Therapeutic  Exercise;Wheelchair propulsion/positioning;Cognitive remediation/compensation;DME/adaptive equipment instruction;Pain management;Splinting/orthotics;UE/LE Strength taining/ROM;Community reintegration;Functional electrical stimulation;Patient/family education;Stair training;UE/LE Coordination activities PT Transfers Anticipated Outcome(s): supervision with LRAD PT Locomotion Anticipated Outcome(s): supervision with LRAD PT Recommendation Follow Up Recommendations: Outpatient PT Patient destination: Home Equipment Recommended: To be determined Equipment Details: has WC and bedside commode   PT Evaluation Precautions/Restrictions Precautions Precautions: Fall;Other (comment) Recall of Precautions/Restrictions: Impaired Precaution/Restrictions Comments: aphasic, 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 Unable to answer (global aphasia) Pain Interference with Therapy Activities: 8. Unable to answer (global aphasia) Pain Interference with Day-to-Day Activities: 8. Unable to answer (global aphasia) Home Living/Prior Functioning Home Living Available Help at Discharge: Family;Available PRN/intermittently Type of Home: House Home Access: Stairs to enter [employer] of Steps: 2-3 Entrance Stairs-Rails: None Home Layout: One level Bathroom Shower/Tub: [employer]: Standard Bathroom Accessibility: Yes Additional Comments: history taken from wife. Has WC and commode  Lives With: Significant other;Family (3 children) Prior Function Level of Independence: Independent with basic ADLs;Independent with gait;Independent with homemaking with  ambulation;Independent with transfers  Able to Take Stairs?: Yes Driving: No Vision/Perception  Vision - History Ability to See in Adequate Light: 4 Severely impaired Vision - Assessment Additional Comments: pt unable to follow my hand for tracking but able to see shower seat, toilet, wc to sit in safely Perception Perception: Impaired Preception Impairment Details: Inattention/Neglect;Spatial orientation Perception-Other Comments: R arm inattention noted in ADLS but pt also grimacing with movement so he was not wanting to use arm Praxis Praxis: Impaired Praxis Impairment Details: Motor planning;Ideomotor  Cognition Overall Cognitive Status: Difficult to assess Arousal/Alertness: Awake/alert Orientation Level: Oriented to person;Oriented to place Memory: Impaired Awareness: Impaired Problem Solving: Impaired Safety/Judgment: Impaired Comments: pt unable to answer questions, but did follow 1 step directions Sensation Sensation Light Touch: Impaired by gross assessment Peripheral sensation comments: difficult to assess Light Touch Impaired Details: Impaired RUE;Impaired RLE Hot/Cold: Not tested Proprioception: Impaired by gross assessment Stereognosis: Not tested Additional Comments: difficult to assess due to apraxia, aphaisa, and impaired sequencing Coordination Gross Motor Movements are Fluid and Coordinated: No Fine Motor Movements are Fluid and Coordinated: No Coordination and Movement Description: grossly uncoordinated due to hemiparesis, decreased balance/coordination, aphasia, apraxia, and impaired motor planning/sequencing Finger Nose Finger Test: unable to test due to impaired vision Motor  Motor Motor: Hemiplegia Motor - Skilled Clinical Observations: hemiparesis, decreased balance/coordination, aphasia, apraxia, and impaired motor planning/sequencing  Trunk/Postural Assessment  Cervical Assessment Cervical Assessment: Within Functional Limits Thoracic  Assessment Thoracic Assessment: Exceptions to [facility] (mild kyphosis) Lumbar Assessment Lumbar Assessment: Exceptions to [facility] (posterior pelvic tilt) Postural Control Postural Control: Deficits on evaluation Righting Reactions: delayed Protective Responses: delayed  Balance Balance Balance Assessed: Yes Static Sitting Balance Static Sitting - Balance Support: Feet supported;Bilateral upper extremity supported Static Sitting - Level of Assistance: 6: Modified independent (Device/Increase time) Dynamic Sitting Balance Dynamic Sitting - Balance Support: Feet supported;No upper extremity supported Dynamic Sitting - Level of Assistance: 5: Stand by assistance (supervision) Static Standing Balance Static Standing - Balance Support: During functional activity;No upper extremity supported Static Standing - Level of Assistance: 4: Min assist Dynamic Standing Balance Dynamic Standing - Balance Support: During functional activity;No upper extremity supported Dynamic Standing - Level of Assistance: 4: Min assist Dynamic Standing - Comments: with transfers and gait Extremity Assessment  RLE Assessment RLE Assessment: Exceptions to [facility] General Strength Comments: tested sitting in WC - limited by apraxia and aphasia. Grossly  3+/5 LLE Assessment LLE Assessment: Exceptions to [facility] General Strength Comments: tested sitting in WC - limited by apraxia and aphasia. Grossly 3+/5  Care Tool Care Tool Bed Mobility Roll left and right activity   Roll left and right assist level: Supervision/Verbal cueing    Sit to lying activity   Sit to lying assist level: Minimal Assistance - Patient > 75%    Lying to sitting on side of bed activity         Care Tool Transfers Sit to stand transfer   Sit to stand assist level: Minimal Assistance - Patient > 75%    Chair/bed transfer   Chair/bed transfer assist level: Minimal Assistance - Patient > 75%    Car transfer   Car transfer assist level: Minimal  Assistance - Patient > 75%      Care Tool Locomotion Ambulation   Assist level: Minimal Assistance - Patient > 75% Assistive device: No Device Max distance: 131ft  Walk 10 feet activity   Assist level: Minimal Assistance - Patient > 75% Assistive device: No Device   Walk 50 feet with 2 turns activity   Assist level: Minimal Assistance - Patient > 75% Assistive device: No Device  Walk 150 feet activity Walk 150 feet activity did not occur: Safety/medical concerns (fatigue, weakness)      Walk 10 feet on uneven surfaces activity Walk 10 feet on uneven surfaces activity did not occur: Safety/medical concerns (fatigue, weakness)      Stairs   Assist level: Minimal Assistance - Patient > 75% Stairs assistive device: 2 hand rails Max number of stairs: 4 (6in)  Walk up/down 1 step activity   Walk up/down 1 step (curb) assist level: Minimal Assistance - Patient > 75% Walk up/down 1 step or curb assistive device: 2 hand rails  Walk up/down 4 steps activity   Walk up/down 4 steps assist level: Minimal Assistance - Patient > 75% Walk up/down 4 steps assistive device: 2 hand rails  Walk up/down 12 steps activity Walk up/down 12 steps activity did not occur: Safety/medical concerns (fatigue, weakness)      Pick up small objects from floor Pick up small object from the floor (from standing position) activity did not occur: Safety/medical concerns (fatigue, weakness)      Wheelchair Is the patient using a wheelchair?: Yes Type of Wheelchair: Manual   Wheelchair assist level: Dependent - Patient 0% Max wheelchair distance: >329ft  Wheel 50 feet with 2 turns activity   Assist Level: Dependent - Patient 0%  Wheel 150 feet activity   Assist Level: Dependent - Patient 0%    Refer to Care Plan for Long Term Goals  SHORT TERM GOAL WEEK 1 PT Short Term Goal 1 (Week 1): STG=LTG due to LOS  Recommendations for other services: None   Skilled Therapeutic Intervention Evaluation completed  (see details above and below) with education on PT POC and goals and individual treatment initiated with focus on functional mobility/transfers, generalized strengthening and endurance, dynamic standing balance/coordination, simulated car transfers, stair navigation, and ambulation. Received pt sitting in [facility] with wife and interpreter present. Pt educated on PT evaluation, CIR policies, and therapy schedule and agreeable. Pt demonstrated aphasia and apraxia throughout session and appeared to have pain in RUE from IV during session - notified MD/RN.   Pt performed all transfers without AD and min A throughout session. Pt transported to/from room in [facility] dependently for time management purposes. Pt performed simulated car transfer without AD and min A (pt opting  to enter via side stepping rather than sitting, then getting legs in). Stood without AD and min A and ambulated 110ft with R HHA and min A overall. Pt ambulates with short, shuffling steps, narrow BOS, decreased step length, and decreased arm swing. At baseline, pt occasionally walked with L limp due to previous ankle injuries.   Stood at staircase with min A and navigated 4 6in steps with 2 handrails and min A alternating ascending and descending with a step to and step through pattern. Wife reports plan to have 2 handrails installed on steps at home. Ambulated to/from Nustep and performed seated BLE and LUE strengthening on workload 2 for 8 minutes with emphasis on cardiovascular endurance and NMR. Did not use RUE due to pain and pt required 2 rest breaks due to fatigue. Total of 287 steps, 0.2 miles, 25 SPM, and 1.4 METs.  Pt performed TUG without AD and CGA/min A for balance with average of 29.6 seconds.  -Trial 1: 32 seconds -Trial 2: 30 seconds -Trial 3: 27 seconds  Returned to room pt indicated desire to return to bed - stand<>pivot without AD and min A and transitioned into supine with min A for BLE management (provided by wife). Concluded  session with pt semi-reclined in bed with all needs within reach and wife at bedside. Safety plan updated and laboratory present for blood draw.   Mobility Bed Mobility Bed Mobility: Rolling Right;Rolling Left;Sit to Supine Rolling Right: Supervision/verbal cueing Rolling Left: Supervision/Verbal cueing Sit to Supine: Minimal Assistance - Patient > 75% Transfers Transfers: Sit to Stand;Stand to Sit;Stand Pivot Transfers Sit to Stand: Minimal Assistance - Patient > 75% Stand to Sit: Contact Guard/Touching assist Stand Pivot Transfers: Minimal Assistance - Patient > 75% Stand Pivot Transfer Details: Verbal cues for precautions/safety Transfer (Assistive device): 1 person hand held assist Locomotion  Gait Ambulation: Yes Gait Assistance: Minimal Assistance - Patient > 75% Gait Distance (Feet): 133 Feet Assistive device: 1 person hand held assist Gait Assistance Details: Verbal cues for precautions/safety Gait Gait: Yes Gait Pattern: Impaired Gait Pattern: Step-to pattern;Decreased step length - right;Decreased step length - left;Decreased stride length;Poor foot clearance - right;Poor foot clearance - left;Narrow base of support;Trunk flexed;Shuffle Gait velocity: decreased Stairs / Additional Locomotion Stairs: Yes Stairs Assistance: Minimal Assistance - Patient > 75% Stair Management Technique: Two rails;Alternating pattern;Step to pattern Number of Stairs: 4 Height of Stairs: 6 Wheelchair Mobility Wheelchair Mobility: Yes Wheelchair Assistance: Dependent - Patient 0% Wheelchair Parts Management: Needs assistance Distance: >322ft   Discharge Criteria: Patient will be discharged from PT if patient refuses treatment 3 consecutive times without medical reason, if treatment goals not met, if there is a change in medical status, if patient makes no progress towards goals or if patient is discharged from hospital.  The above assessment, treatment plan, treatment alternatives and  goals were discussed and mutually agreed upon: by patient and by family  Carlisa Eble M Zaunegger Jaquese Irving Zaunegger PT, DPT 08/15/2024, 12:08 PM

## 2024-08-15 NOTE — Plan of Care (Signed)
" °  Problem: RH Swallowing Goal: LTG Patient will consume least restrictive diet using compensatory strategies with assistance (SLP) Description: LTG:  Patient will consume least restrictive diet using compensatory strategies with assistance (SLP) Flowsheets (Taken 08/15/2024 1543) LTG: Pt Patient will consume least restrictive diet using compensatory strategies with assistance of (SLP): Minimal Assistance - Patient > 75%   Problem: RH Comprehension Communication Goal: LTG Patient will comprehend basic/complex auditory (SLP) Description: LTG: Patient will comprehend basic/complex auditory information with cues (SLP). Flowsheets (Taken 08/15/2024 1543) LTG: Patient will comprehend: Basic auditory information LTG: Patient will comprehend auditory information with cueing (SLP): Maximal Assistance - Patient 25 - 49%   Problem: RH Expression Communication Goal: LTG Patient will express needs/wants via multi-modal(SLP) Description: LTG:  Patient will express needs/wants via multi-modal communication (gestures/written, etc) with cues (SLP) Flowsheets (Taken 08/15/2024 1543) LTG: Patient will express needs/wants via multimodal communication (gestures/written, etc) with cueing (SLP): Maximal Assistance - Patient 25 - 49%   "

## 2024-08-15 NOTE — Progress Notes (Signed)
 " Inpatient Rehabilitation Care Coordinator Assessment and Plan Patient Details  Name: Blake Santana MRN: [id number] Date of Birth: May 04, 1971  Today's Date: 08/15/2024  Hospital Problems: Principal Problem:   CVA (cerebral vascular accident) [facility])  Past Medical History:  Past Medical History:  Diagnosis Date   Acute respiratory failure with hypoxia (HCC) 09/07/2018   Anemia of chronic kidney failure 03/11/2017   Cataracts, bilateral 2019   Chest pain 09/01/2018   minimal luminal irregularities   Chronic diastolic CHF (congestive heart failure) (HCC)    Chronic kidney disease 03/11/2017   Dialysis Tu/Th/Sa   COVID 2022   hospitalized   Diabetes mellitus without complication (HCC) 1999   Edema 01/14/2017   Headache    Hypercholesterolemia    Hypertension 03/11/2017   Hypertensive retinopathy of both eyes 2019   Iron  deficiency anemia 06/18/2016   Pneumonia 09/07/2018   Proliferative diabetic retinopathy with macular edema (HCC) 2019   Retinal hemorrhage, bilateral and left vitreous hemorrhage.  Being treated with Lucentis.  Dr. Raj   RSV (respiratory syncytial virus infection) 08/2017   Past Surgical History:  Past Surgical History:  Procedure Laterality Date   A/V FISTULAGRAM Left 01/19/2018   Procedure: A/V FISTULAGRAM - Left AV;  Surgeon: Sheree Penne Bruckner, MD;  Location: [facility] INVASIVE CV LAB;  Service: Cardiovascular;  Laterality: Left;   AV FISTULA PLACEMENT Left 08/09/2017   Procedure: LEFT RADIOCEPHALIC ARTERIOVENOUS (AV) FISTULA CREATION;  Surgeon: Oris Krystal FALCON, MD;  Location: [facility] OR;  Service: Vascular;  Laterality: Left;   AV FISTULA PLACEMENT Left 02/02/2018   Procedure: ARTERIOVENOUS (AV) FISTULA CREATION LEFT UPPER EXTREMITY;  Surgeon: Oris Krystal FALCON, MD;  Location: [facility] OR;  Service: Vascular;  Laterality: Left;   INSERTION OF DIALYSIS CATHETER  08/09/2017   INSERTION OF DIALYSIS CATHETER Right 08/09/2017   Procedure: INSERTION OF DIALYSIS CATHETER  RIGHT INTERNAL JUGULAR;  Surgeon: Oris Krystal FALCON, MD;  Location: [facility] OR;  Service: Vascular;  Laterality: Right;   IR DIALY SHUNT INTRO NEEDLE/INTRACATH INITIAL W/IMG LEFT Left 07/10/2020   IR DIALY SHUNT INTRO NEEDLE/INTRACATH INITIAL W/IMG LEFT Left 04/14/2021   IR REMOVAL TUN CV CATH W/O FL  07/01/2018   LEFT HEART CATH AND CORONARY ANGIOGRAPHY N/A 09/05/2018   Procedure: LEFT HEART CATH AND CORONARY ANGIOGRAPHY;  Surgeon: Dann Candyce RAMAN, MD;  Location: [facility] INVASIVE CV LAB;  Service: Cardiovascular;  Laterality: N/A;   OPEN REDUCTION INTERNAL FIXATION (ORIF) DISTAL RADIAL FRACTURE Left 11/20/2021   Procedure: POSSIBLE OPEN REDUCTION INTERNAL FIXATION (ORIF) DISTAL RADIAL FRACTURE;  Surgeon: Kendal Franky SQUIBB, MD;  Location: [facility] OR;  Service: Orthopedics;  Laterality: Left;   ORIF ANKLE FRACTURE Left 11/20/2021   Procedure: OPEN REDUCTION INTERNAL FIXATION (ORIF) ANKLE PILON FRACTURE;  Surgeon: Kendal Franky SQUIBB, MD;  Location: [facility] OR;  Service: Orthopedics;  Laterality: Left;   ORIF TIBIA PLATEAU Right 11/20/2021   Procedure: OPEN REDUCTION INTERNAL FIXATION (ORIF) TIBIAL PLATEAU;  Surgeon: Kendal Franky SQUIBB, MD;  Location: [facility] OR;  Service: Orthopedics;  Laterality: Right;   PARATHYROIDECTOMY N/A 10/26/2022   Procedure: total PARATHYROIDECTOMY;  Surgeon: Eletha Krystal, MD;  Location: [facility] OR;  Service: General;  Laterality: N/A;   PARATHYROIDECTOMY Right 10/26/2022   Procedure: AUTOTRANSPLANT TO RIGHT FOREARM;  Surgeon: Eletha Krystal, MD;  Location: [facility] OR;  Service: General;  Laterality: Right;   Social History:  reports that he has quit smoking. His smoking use included cigarettes. He has been exposed to tobacco smoke. He has never used smokeless tobacco. He reports that  he does not currently use alcohol . He reports that he does not use drugs.  Family / Support Systems Marital Status: Single Patient Roles: Partner, Parent, Other (Comment) (employee) Spouse/Significant Other: Kenneth [phone] needs spanish  interpreter-doesnt speak English Children: Vonja-daughter [phone] Other Supports: 37 yo son and another son Anticipated Caregiver: Girlfriend and their 23 son along with two children of girlfriend Ability/Limitations of Caregiver: Girlfriend works 7p-10p daily and adult children can be there when she is working Medical Laboratory Scientific Officer: 24/7 Family Dynamics: Close knit with family and does have neighbors and friends who will check on and do what they can  Social History Preferred language: Spanish Religion: Catholic Cultural Background: From [place] has been here since 1999 Education: 9th grade Health Literacy - How often do you need to have someone help you when you read instructions, pamphlets, or other written material from your doctor or pharmacy?: Patient unable to respond Writes: Yes Employment Status: Employed Name of Employer: painter Return to Work Plans: Woud need to recover to return to Adult Nurse Issues: Is not here legally and has no green card so can not get services at discharge. Guardian/Conservator: No issues-according to MD pt is not fully capable of making his own decisions while here. Will look toward his girlfriend who is the mother of their 10 yo son if any decisions need to be made while here   Abuse/Neglect Abuse/Neglect Assessment Can Be Completed: Yes Physical Abuse: Denies Verbal Abuse: Denies Sexual Abuse: Denies Exploitation of patient/patient's resources: Denies Self-Neglect: Denies  Patient response to: Social Isolation - How often do you feel lonely or isolated from those around you?: Rarely  Emotional Status Pt's affect, behavior and adjustment status: Pt let his girlfriend answer for him, due to he has speech issues from his stroke. She reports he was independent prior to this and did not drive due to loss of vision in his left eye from a previous injury. 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 She will assist if needed and so will the children Recent Psychosocial Issues: other health issues-does go to a PCP and they are managed Psychiatric History: No history if his speech improves he would benefit from seeing neuro-psych while here Substance Abuse History: No issues he quit tobacco and ETOH  Patient / Family Perceptions, Expectations & Goals Pt/Family understanding of illness & functional limitations: Girlfriend can explain his stroke and deficits, she hopes he does well here and makes good progress. She tries to talk with the MD and will get interpreter for her if she would like. She did talk with Dr Carilyn today with interpreter and felt her questions were answered and she understands the plan moving forward. Premorbid pt/family roles/activities: partner, father, step-dad, neighbor, employee, friend, etc Anticipated changes in roles/activities/participation: resume Pt/family expectations/goals: Girlfriend states:  We hopes he does well and makes good progress here, he is not one to ask for help and would rather do it himself.  Community Resources [employer]: None Premorbid Home Care/DME Agencies: None Transportation available at discharge: girlfriend and children Is the patient able to respond to transportation needs?: Yes In the past 12 months, has lack of transportation kept you from medical appointments or from getting medications?: No In the past 12 months, has lack of transportation kept you from meetings, work, or from getting things needed for daily living?: No Resource referrals recommended: Neuropsychology  Discharge Planning Living Arrangements: Spouse/significant other, Children Support Systems:  Spouse/significant other, Children, Friends/neighbors Type of Residence: Private residence Insurance Resources: Customer Service Manager Resources: Employment, Family Support Financial Screen Referred: Yes Living Expenses: Own Money Management: Patient,  Significant Other Does the patient have any problems obtaining your medications?: Yes (Describe) (uninsured cost of medications) Home Management: girlfriend Patient/Family Preliminary Plans: Return home with girlfriend and children, there is an issue with the home and it is in foreclosure. This has been extended but unsure for how long due to was a patient here in the hospital when missed a court date. Both hope will be long enough to sell and move to an apartment. Aware being evaluated today and goals being set for stay here. Care Coordinator Barriers to Discharge: Other (comments) Care Coordinator Barriers to Discharge Comments: no insurance Care Coordinator Anticipated Follow Up Needs: HH/OP  Clinical Impression Pleasant gentleman who is quiet and lets girlfriend-partner answer the questions asked by social worker even though an interpreter was present. 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 Will await team's evaluations and update tomorrow after team conference. Aware he may need 24/7 care at discharge and dealing with home situation. Limited resources at discharge due to no insurance or eligible for insurance. Would benefit from neuro-psych if progresses and is appropriate will get input from team.  Raymonde Asberry MATSU 08/15/2024, 1:02 PM    "

## 2024-08-15 NOTE — Consult Note (Signed)
 ESRD Consult Note  Reason for consult: ESRD, provision of dialysis  Assessment/Recommendations:  ESRD - on HD MWF - next HD Wed   Acute CVA  - L posterior MCA territory per MRI - aphasia - had CVA extension  -per primary, in CIR   HTN/Volume - on norvasc  (coreg  dc'd)  - per neuro -> try to maintain SBP 130-150 given L M2 occlusion -will UF as tolerated   Anemia of esrd - Hb 10.7, follow, no esa needs yet  Secondary hyperparathyroidism -check phos   Dispo: in CIR   OP HD: [facility] 3h B350 65.2kg  AVF  Hep 2000  Last OP HD 1/02, post wt 67.2kg Hep B SAg neg 07/10/24 Heb B SAb +Immune 05/15/24  Ephriam Stank, MD [facility]  History of Present Illness: Blake Santana is a/an 54 y.o. male with a past medical history of ESRD, DM2, HTN, PAD, HFpEF who presents initially with CVA. Presented with aphasia and choking on dinner. Associated left sided facial numbness. Found to have CVA, MRI revealed acute left posterior MCA infarct involving the left anterior temporal lobe, oberlying parietal lobe, and posterior left insula with some edema without mass effect. CTA showed left M2 MCA occlusion and severe PCA P2 occlusion. Has been on DAPT x 21 days followed by ASA monotherapy. Transferred to CIR for further rehab. Patient seen in room, interpreter in room as well. Underwent HD yesterday with net UF 2L. No acute events. Continuing nephrology care from floor.   Medications:  Current Facility-Administered Medications  Medication Dose Route Frequency Provider Last Rate Last Admin   acetaminophen  (TYLENOL ) tablet 650 mg  650 mg Oral Q6H PRN Jerilynn Daphne SAILOR, NP   650 mg at 08/14/24 2203   Or   acetaminophen  (TYLENOL ) suppository 650 mg  650 mg Rectal Q6H PRN Jerilynn Daphne SAILOR, NP       amLODipine  (NORVASC ) tablet 10 mg  10 mg Oral Daily Jerilynn Daphne SAILOR, NP       amoxicillin -clavulanate (AUGMENTIN ) 500-125 MG per tablet 1 tablet  1 tablet Oral  Daily Amin, Aqsa, MD   1 tablet at 08/14/24 2201   bisacodyl  (DULCOLAX) suppository 10 mg  10 mg Rectal Daily PRN Jerilynn Daphne SAILOR, NP       carvedilol  (COREG ) tablet 12.5 mg  12.5 mg Oral BID WC Carilyn Prentice BRAVO, MD   12.5 mg at 08/15/24 9344   Chlorhexidine  Gluconate Cloth 2 % PADS 6 each  6 each Topical Q0600 Jerilynn Daphne SAILOR, NP   6 each at 08/15/24 9346   clopidogrel  (PLAVIX ) tablet 75 mg  75 mg Oral Daily Jerilynn Daphne SAILOR, NP       diphenhydrAMINE  (BENADRYL ) capsule 25 mg  25 mg Oral Q6H PRN Jerilynn Daphne SAILOR, NP       ezetimibe  (ZETIA ) tablet 10 mg  10 mg Oral Daily Jerilynn Daphne SAILOR, NP       guaiFENesin -dextromethorphan  (ROBITUSSIN DM) 100-10 MG/5ML syrup 5-10 mL  5-10 mL Oral Q6H PRN Jerilynn Daphne SAILOR, NP       heparin  injection 5,000 Units  5,000 Units Subcutaneous Q8H Jerilynn Daphne SAILOR, NP   5,000 Units at 08/15/24 9344   insulin  aspart (novoLOG ) injection 0-5 Units  0-5 Units Subcutaneous QHS Jerilynn Daphne SAILOR, NP   2 Units at 08/14/24 2159   insulin  aspart (novoLOG ) injection 0-6 Units  0-6 Units Subcutaneous TID WC Jerilynn Daphne SAILOR, NP       lidocaine -prilocaine  (EMLA ) cream 1 Application  1 Application Topical PRN Jerilynn Daphne SAILOR, NP       milk and molasses enema  1 enema Rectal Daily PRN Jerilynn Daphne SAILOR, NP       prochlorperazine  (COMPAZINE ) tablet 5-10 mg  5-10 mg Oral Q6H PRN Jerilynn Daphne SAILOR, NP       Or   prochlorperazine  (COMPAZINE ) suppository 12.5 mg  12.5 mg Rectal Q6H PRN Jerilynn Daphne SAILOR, NP       Or   prochlorperazine  (COMPAZINE ) injection 5-10 mg  5-10 mg Intravenous Q6H PRN Jerilynn Daphne SAILOR, NP       rosuvastatin  (CRESTOR ) tablet 10 mg  10 mg Oral Daily Jerilynn Daphne SAILOR, NP       simethicone  (MYLICON) chewable tablet 80 mg  80 mg Oral QID PRN Jerilynn Daphne SAILOR, NP       traZODone  (DESYREL ) tablet 25-50 mg  25-50 mg Oral QHS PRN Jerilynn Daphne SAILOR, NP   50 mg at 08/14/24 2204     ALLERGIES Patient has no known allergies.  MEDICAL  HISTORY Past Medical History:  Diagnosis Date   Acute respiratory failure with hypoxia (HCC) 09/07/2018   Anemia of chronic kidney failure 03/11/2017   Cataracts, bilateral 2019   Chest pain 09/01/2018   minimal luminal irregularities   Chronic diastolic CHF (congestive heart failure) (HCC)    Chronic kidney disease 03/11/2017   Dialysis Tu/Th/Sa   COVID 2022   hospitalized   Diabetes mellitus without complication (HCC) 1999   Edema 01/14/2017   Headache    Hypercholesterolemia    Hypertension 03/11/2017   Hypertensive retinopathy of both eyes 2019   Iron  deficiency anemia 06/18/2016   Pneumonia 09/07/2018   Proliferative diabetic retinopathy with macular edema (HCC) 2019   Retinal hemorrhage, bilateral and left vitreous hemorrhage.  Being treated with Lucentis.  Dr. Raj   RSV (respiratory syncytial virus infection) 08/2017     SOCIAL HISTORY Social History   Socioeconomic History   Marital status: Married    Spouse name: Kenneth Orchard   Number of children: 1   Years of education: 9   Highest education level: Not on file  Occupational History   Occupation: Education Administrator  Tobacco Use   Smoking status: Former    Types: Cigarettes    Passive exposure: Past   Smokeless tobacco: Never  Vaping Use   Vaping status: Never Used  Substance and Sexual Activity   Alcohol use: Not Currently   Drug use: No   Sexual activity: Not on file  Other Topics Concern   Not on file  Social History Narrative   Originally from [place]   Came to [employer]. In 1999   Lives with his girlfriend and their son.   Social Drivers of Health   Tobacco Use: Medium Risk (08/06/2024)   Patient History    Smoking Tobacco Use: Former    Smokeless Tobacco Use: Never    Passive Exposure: Past  Physicist, Medical Strain: Low Risk (02/29/2024)   Overall Financial Resource Strain (CARDIA)    Difficulty of Paying Living Expenses: Not very hard  Food Insecurity: Food Insecurity Present (08/08/2024)   Epic     Worried About Programme Researcher, Broadcasting/film/video in the Last Year: Sometimes true    Ran Out of Food in the Last Year: Sometimes true  Transportation Needs: No Transportation Needs (08/08/2024)   Epic    Lack of Transportation (Medical): No    Lack of Transportation (Non-Medical): No  Physical Activity: Inactive (02/29/2024)   Exercise Vital  Sign    Days of Exercise per Week: 0 days    Minutes of Exercise per Session: 0 min  Stress: No Stress Concern Present (02/29/2024)   [employer] of Occupational Health - Occupational Stress Questionnaire    Feeling of Stress: Not at all  Social Connections: Unknown (08/08/2024)   Social Connection and Isolation Panel    Frequency of Communication with Friends and Family: Not on file    Frequency of Social Gatherings with Friends and Family: Not on file    Attends Religious Services: Not on file    Active Member of Clubs or Organizations: Not on file    Attends Banker Meetings: Not on file    Marital Status: Married  Intimate Partner Violence: Not At Risk (08/08/2024)   Epic    Fear of Current or Ex-Partner: No    Emotionally Abused: No    Physically Abused: No    Sexually Abused: No  Depression (PHQ2-9): Low Risk (02/29/2024)   Depression (PHQ2-9)    PHQ-2 Score: 1  Alcohol Screen: Low Risk (02/29/2024)   Alcohol Screen    Last Alcohol Screening Score (AUDIT): 0  Housing: High Risk (08/08/2024)   Epic    Unable to Pay for Housing in the Last Year: Yes    Number of Times Moved in the Last Year: 0    Homeless in the Last Year: No  Utilities: Not At Risk (08/08/2024)   Epic    Threatened with loss of utilities: No  Health Literacy: Adequate Health Literacy (02/29/2024)   B1300 Health Literacy    Frequency of need for help with medical instructions: Never     FAMILY HISTORY Family History  Problem Relation Age of Onset   Diabetes Mother    Kidney disease Mother        renal failure--diabetes, cause of death   Hypertension Mother     Diabetes Father    Kidney disease Father        Cause of death:  kidney failure form diabetes   Alcohol abuse Father    Diabetes Sister      Review of Systems: 12 systems were reviewed and negative except per HPI  Physical Exam: Vitals:   08/14/24 1832 08/15/24 0654  BP: (!) 154/73 (!) 143/73  Pulse: 68 60  Resp: 18 18  Temp: 98.1 F (36.7 C) 98.2 F (36.8 C)  SpO2: 99% 99%   Total I/O In: 120 [P.O.:120] Out: -   Intake/Output Summary (Last 24 hours) at 08/15/2024 9062 Last data filed at 08/15/2024 0844 Gross per 24 hour  Intake 240 ml  Output --  Net 240 ml   General: no acute distress, in wheelchair, weak appearing HEENT: anicteric sclera, MMM CV: normal rate, no murmurs, no edema Lungs: bilateral chest rise, normal wob Abd: soft, non-tender, non-distended Skin: no visible lesions or rashes Neuro: aphasic, awake, alert Dialysis access: LUE AVF +b/t  Test Results Reviewed Lab Results  Component Value Date   NA 135 08/14/2024   K 4.9 08/14/2024   CL 93 (L) 08/14/2024   CO2 26 08/14/2024   BUN 50 (H) 08/14/2024   CREATININE 10.30 (H) 08/14/2024   CALCIUM  7.1 (L) 08/14/2024   ALBUMIN 3.8 08/08/2024   PHOS 5.9 (H) 08/08/2024    I have reviewed relevant outside healthcare records

## 2024-08-15 NOTE — Progress Notes (Signed)
 Inpatient Rehabilitation Admission Medication Review by a Pharmacist  A complete drug regimen review was completed for this patient to identify any potential clinically significant medication issues.  High Risk Drug Classes Is patient taking? Indication by Medication  Antipsychotic Yes Compazine  prn N/V  Anticoagulant Yes Sq heparin  - VTE ppx  Antibiotic Yes Augmentin  po - dental infection  Opioid No   Antiplatelet Yes Clopidogrel  - PAD, CVA  Hypoglycemics/insulin  Yes Insulin  SSI- DM  Vasoactive Medication Yes Amlodipine , Carvedilol  - HTN  Chemotherapy No   Other Yes Ezetimibe , Rosuvastatin - HLD Trazodone  prn sleep     Type of Medication Issue Identified Description of Issue Recommendation(s)  Drug Interaction(s) (clinically significant)     Duplicate Therapy     Allergy     No Medication Administration End Date     Incorrect Dose     Additional Drug Therapy Needed     Significant med changes from prior encounter (inform family/care partners about these prior to discharge).    Other       Clinically significant medication issues were identified that warrant physician communication and completion of prescribed/recommended actions by midnight of the next day:  No  Name of provider notified for urgent issues identified:   Provider Method of Notification:     Pharmacist comments: PO glipizide  PTA  Time spent performing this drug regimen review (minutes):  20 minutes  Thank you. Olam Monte, PharmD

## 2024-08-15 NOTE — Progress Notes (Signed)
 Contacted pt out pt HD clinic [facility] to inform of  pt being admitted to CIR. Will continue to assist as needed.  Neyra Pettie Dialysis navigator [phone]

## 2024-08-15 NOTE — Progress Notes (Signed)
 "                                                        PROGRESS NOTE   Subjective/Complaints:  Ipad interpreter used , then live interpreter showed up  Wife speaks no English and states pt spoke mostly Spanish PTA. We discussed that CVA affected language center in brain, severely affecting all language   Objective:   No results found. Recent Labs    08/14/24 0239  WBC 7.6  HGB 10.7*  HCT 32.6*  PLT 146*   Recent Labs    08/13/24 0136 08/14/24 0239  NA 134* 135  K 4.5 4.9  CL 92* 93*  CO2 28 26  GLUCOSE 106* 134*  BUN 35* 50*  CREATININE 8.65* 10.30*  CALCIUM  7.4* 7.1*    Intake/Output Summary (Last 24 hours) at 08/15/2024 0815 Last data filed at 08/14/2024 1832 Gross per 24 hour  Intake 120 ml  Output --  Net 120 ml        Physical Exam: Vital Signs Blood pressure (!) 143/73, pulse 60, temperature 98.2 F (36.8 C), temperature source Oral, resp. rate 18, height 5' 2 (1.575 m), weight 68 kg, SpO2 99%.   General: No acute distress Mood and affect are appropriate Heart: Regular rate and rhythm no rubs murmurs or extra sounds Lungs: Clear to auscultation, breathing unlabored, no rales or wheezes Abdomen: Positive bowel sounds, soft nontender to palpation, nondistended Extremities: No clubbing, cyanosis, or edema Skin: No evidence of breakdown, no evidence of rash Neurologic: Cranial nerves II through XII intact, motor strength is difficult to assess to to comprehension deficit, responds to gestural cues is antigravity in all 4 limbs but stronger on Left than on the right  Sensory exam withdrws to pinch in BUE but formal testing limited by aphasia  No words attempted , cannot follow verbal commands even with interpreter assist  Needs gestural cues  Cerebellar exam cannot cooperate with exam  Musculoskeletal:no pain with  range of motion in all 4 extremities. No joint swelling   Assessment/Plan: 1. Functional deficits which require 3+ hours per day of  interdisciplinary therapy in a comprehensive inpatient rehab setting. Physiatrist is providing close team supervision and 24 hour management of active medical problems listed below. Physiatrist and rehab team continue to assess barriers to discharge/monitor patient progress toward functional and medical goals  Care Tool:  Bathing              Bathing assist       Upper Body Dressing/Undressing Upper body dressing        Upper body assist      Lower Body Dressing/Undressing Lower body dressing            Lower body assist       Toileting Toileting    Toileting assist       Transfers Chair/bed transfer  Transfers assist           Locomotion Ambulation   Ambulation assist              Walk 10 feet activity   Assist           Walk 50 feet activity   Assist           Walk 150 feet activity   Assist  Walk 10 feet on uneven surface  activity   Assist           Wheelchair     Assist               Wheelchair 50 feet with 2 turns activity    Assist            Wheelchair 150 feet activity     Assist          Blood pressure (!) 143/73, pulse 60, temperature 98.2 F (36.8 C), temperature source Oral, resp. rate 18, height 5' 2 (1.575 m), weight 68 kg, SpO2 99%.  Medical Problem List and Plan: 1. Functional deficits secondary to Left MCA infarct             -patient may  shower             -ELOS/Goals: 7-10d , Sup-MinA goals, evals today , team conf in am    2.  Antithrombotics: -DVT/anticoagulation:  Mechanical: Sequential compression devices, below knee Bilateral lower extremities Pharmaceutical: Heparin              -antiplatelet therapy: Aspirin  and Plavix  for 3 months, then followed by aspirin  alone.   3. Pain Management: Tylenol  prn    4. Mood/Behavior/Sleep: LCSW to follow for evaluation and support when available.              -antipsychotic agents: n/a              -sleep: Trazadone prn    5. Neuropsych/cognition: This patient is not capable of making decisions on his  own behalf.   6. Skin/Wound Care: Routine pressure relief measures.    7. Fluids/Electrolytes/Nutrition: Monitor I&O and weight. Follow up labs monitored by HD.    8.  Acute CVA: Continue DAPT aspirin  and Plavix  for 3 months, then followed by aspirin  alone.  Continue Crestor  and Zetia .  Follow-up outpatient with cardiology for heart monitor.   9.  ESRD: On HD TTS, nephrology following.   10.  Chronic HFpEF: Continue fluid removal per HD.    11.  Type 2 diabetes: A1c 8.5, poorly controlled.  Used glipizide  10 mg at home. -Monitor glucose ACHS with SSI NovoLog .   12.  Anemia of chronic disease: Hemoglobin 10.7.  Monitor CBC.    13.  Hyperlipidemia/CAD: Continue tatin, zetia , aspirin  and plavix .    14.  Dental pain: Received IV Unasyn  transition Continue Augmentin  x 4 days (confirming with hospitalist).           LOS: 1 days A FACE TO FACE EVALUATION WAS PERFORMED  Blake Santana 08/15/2024, 8:15 AM     "

## 2024-08-15 NOTE — Plan of Care (Signed)
 " Problem: RH Balance Goal: LTG: Patient will maintain dynamic sitting balance (OT) Description: LTG:  Patient will maintain dynamic sitting balance with assistance during activities of daily living (OT) Flowsheets (Taken 08/15/2024 1213) LTG: Pt will maintain dynamic sitting balance during ADLs with: Supervision/Verbal cueing Goal: LTG Patient will maintain dynamic standing with ADLs (OT) Description: LTG:  Patient will maintain dynamic standing balance with assist during activities of daily living (OT)  Flowsheets (Taken 08/15/2024 1213) LTG: Pt will maintain dynamic standing balance during ADLs with: Supervision/Verbal cueing   Problem: Sit to Stand Goal: LTG:  Patient will perform sit to stand in prep for activites of daily living with assistance level (OT) Description: LTG:  Patient will perform sit to stand in prep for activites of daily living with assistance level (OT) Flowsheets (Taken 08/15/2024 1215) LTG: PT will perform sit to stand in prep for activites of daily living with assistance level: Supervision/Verbal cueing   Problem: RH Eating Goal: LTG Patient will perform eating w/assist, cues/equip (OT) Description: LTG: Patient will perform eating with assist, with/without cues using equipment (OT) Flowsheets (Taken 08/15/2024 1215) LTG: Pt will perform eating with assistance level of: Set up assist    Problem: RH Grooming Goal: LTG Patient will perform grooming w/assist,cues/equip (OT) Description: LTG: Patient will perform grooming with assist, with/without cues using equipment (OT) Flowsheets (Taken 08/15/2024 1215) LTG: Pt will perform grooming with assistance level of: Set up assist    Problem: RH Bathing Goal: LTG Patient will bathe all body parts with assist levels (OT) Description: LTG: Patient will bathe all body parts with assist levels (OT) Flowsheets (Taken 08/15/2024 1215) LTG: Pt will perform bathing with assistance level/cueing: Contact Guard/Touching assist LTG:  Position pt will perform bathing: Shower   Problem: RH Dressing Goal: LTG Patient will perform upper body dressing (OT) Description: LTG Patient will perform upper body dressing with assist, with/without cues (OT). Flowsheets (Taken 08/15/2024 1215) LTG: Pt will perform upper body dressing with assistance level of: Supervision/Verbal cueing Goal: LTG Patient will perform lower body dressing w/assist (OT) Description: LTG: Patient will perform lower body dressing with assist, with/without cues in positioning using equipment (OT) Flowsheets (Taken 08/15/2024 1215) LTG: Pt will perform lower body dressing with assistance level of: Contact Guard/Touching assist   Problem: RH Toileting Goal: LTG Patient will perform toileting task (3/3 steps) with assistance level (OT) Description: LTG: Patient will perform toileting task (3/3 steps) with assistance level (OT)  Flowsheets (Taken 08/15/2024 1215) LTG: Pt will perform toileting task (3/3 steps) with assistance level: Supervision/Verbal cueing   Problem: RH Functional Use of Upper Extremity Goal: LTG Patient will use RT/LT upper extremity as a (OT) Description: LTG: Patient will use right/left upper extremity as a stabilizer/gross assist/diminished/nondominant/dominant level with assist, with/without cues during functional activity (OT) Flowsheets (Taken 08/15/2024 1215) LTG: Use of upper extremity in functional activities: RUE as diminished level LTG: Pt will use upper extremity in functional activity with assistance level of: Supervision/Verbal cueing   Problem: RH Toilet Transfers Goal: LTG Patient will perform toilet transfers w/assist (OT) Description: LTG: Patient will perform toilet transfers with assist, with/without cues using equipment (OT) Flowsheets (Taken 08/15/2024 1215) LTG: Pt will perform toilet transfers with assistance level of: Supervision/Verbal cueing   Problem: RH Tub/Shower Transfers Goal: LTG Patient will perform  tub/shower transfers w/assist (OT) Description: LTG: Patient will perform tub/shower transfers with assist, with/without cues using equipment (OT) Flowsheets (Taken 08/15/2024 1215) LTG: Pt will perform tub/shower stall transfers with assistance level of: Supervision/Verbal cueing   "

## 2024-08-15 NOTE — Progress Notes (Signed)
 Inpatient Rehabilitation Center Individual Statement of Services  Patient Name:  Blake Santana  Date:  08/15/2024  Welcome to the Inpatient Rehabilitation Center.  Our goal is to provide you with an individualized program based on your diagnosis and situation, designed to meet your specific needs.  With this comprehensive rehabilitation program, you will be expected to participate in at least 3 hours of rehabilitation therapies Monday-Friday, with modified therapy programming on the weekends.  Your rehabilitation program will include the following services:  Physical Therapy (PT), Occupational Therapy (OT), Speech Therapy (ST), 24 hour per day rehabilitation nursing, Therapeutic Recreaction (TR), Neuropsychology, Care Coordinator, Rehabilitation Medicine, Nutrition Services, and Pharmacy Services  Weekly team conferences will be held on Wednesday to discuss your progress.  Your Inpatient Rehabilitation Care Coordinator will talk with you frequently to get your input and to update you on team discussions.  Team conferences with you and your family in attendance may also be held.  Expected length of stay: 7-10 days  Overall anticipated outcome: supervision with cues  Depending on your progress and recovery, your program may change. Your Inpatient Rehabilitation Care Coordinator will coordinate services and will keep you informed of any changes. Your Inpatient Rehabilitation Care Coordinator's name and contact numbers are listed  below.  The following services may also be recommended but are not provided by the Inpatient Rehabilitation Center:   Home Health Rehabiltiation Services Outpatient Rehabilitation Services Vocational Rehabilitation   Arrangements will be made to provide these services after discharge if needed.  Arrangements include referral to agencies that provide these services.  Your insurance has been verified to be:  Uninsured Your primary doctor is:  Eryk Beavers  Pertinent information will be shared with your doctor and your insurance company.  Inpatient Rehabilitation Care Coordinator:  Rhoda Clement, KEN [phone] or ELIGAH BASQUES  Information discussed with and copy given to patient by: Clement Asberry MATSU, 08/15/2024, 1:06 PM

## 2024-08-15 NOTE — Evaluation (Signed)
 Speech Language Pathology Assessment and Plan  Patient Details  Name: Blake Santana MRN: [id number] Date of Birth: 1970/10/12  SLP Diagnosis: Aphasia;Apraxia;Cognitive Impairments;Dysphagia  Rehab Potential: Fair ELOS: 7-10 days    Today's Date: 08/15/2024 SLP Individual Time: 1330-1430 SLP Individual Time Calculation (min): 60 min   Hospital Problem: Principal Problem:   CVA (cerebral vascular accident) [facility])  Past Medical History:  Past Medical History:  Diagnosis Date   Acute respiratory failure with hypoxia (HCC) 09/07/2018   Anemia of chronic kidney failure 03/11/2017   Cataracts, bilateral 2019   Chest pain 09/01/2018   minimal luminal irregularities   Chronic diastolic CHF (congestive heart failure) (HCC)    Chronic kidney disease 03/11/2017   Dialysis Tu/Th/Sa   COVID 2022   hospitalized   Diabetes mellitus without complication (HCC) 1999   Edema 01/14/2017   Headache    Hypercholesterolemia    Hypertension 03/11/2017   Hypertensive retinopathy of both eyes 2019   Iron  deficiency anemia 06/18/2016   Pneumonia 09/07/2018   Proliferative diabetic retinopathy with macular edema (HCC) 2019   Retinal hemorrhage, bilateral and left vitreous hemorrhage.  Being treated with Lucentis.  Dr. Raj   RSV (respiratory syncytial virus infection) 08/2017   Past Surgical History:  Past Surgical History:  Procedure Laterality Date   A/V FISTULAGRAM Left 01/19/2018   Procedure: A/V FISTULAGRAM - Left AV;  Surgeon: Sheree Penne Bruckner, MD;  Location: [facility] INVASIVE CV LAB;  Service: Cardiovascular;  Laterality: Left;   AV FISTULA PLACEMENT Left 08/09/2017   Procedure: LEFT RADIOCEPHALIC ARTERIOVENOUS (AV) FISTULA CREATION;  Surgeon: Oris Krystal FALCON, MD;  Location: [facility] OR;  Service: Vascular;  Laterality: Left;   AV FISTULA PLACEMENT Left 02/02/2018   Procedure: ARTERIOVENOUS (AV) FISTULA CREATION LEFT UPPER EXTREMITY;  Surgeon: Oris Krystal FALCON, MD;  Location: [facility] OR;   Service: Vascular;  Laterality: Left;   INSERTION OF DIALYSIS CATHETER  08/09/2017   INSERTION OF DIALYSIS CATHETER Right 08/09/2017   Procedure: INSERTION OF DIALYSIS CATHETER RIGHT INTERNAL JUGULAR;  Surgeon: Oris Krystal FALCON, MD;  Location: [facility] OR;  Service: Vascular;  Laterality: Right;   IR DIALY SHUNT INTRO NEEDLE/INTRACATH INITIAL W/IMG LEFT Left 07/10/2020   IR DIALY SHUNT INTRO NEEDLE/INTRACATH INITIAL W/IMG LEFT Left 04/14/2021   IR REMOVAL TUN CV CATH W/O FL  07/01/2018   LEFT HEART CATH AND CORONARY ANGIOGRAPHY N/A 09/05/2018   Procedure: LEFT HEART CATH AND CORONARY ANGIOGRAPHY;  Surgeon: Dann Candyce RAMAN, MD;  Location: [facility] INVASIVE CV LAB;  Service: Cardiovascular;  Laterality: N/A;   OPEN REDUCTION INTERNAL FIXATION (ORIF) DISTAL RADIAL FRACTURE Left 11/20/2021   Procedure: POSSIBLE OPEN REDUCTION INTERNAL FIXATION (ORIF) DISTAL RADIAL FRACTURE;  Surgeon: Kendal Franky SQUIBB, MD;  Location: [facility] OR;  Service: Orthopedics;  Laterality: Left;   ORIF ANKLE FRACTURE Left 11/20/2021   Procedure: OPEN REDUCTION INTERNAL FIXATION (ORIF) ANKLE PILON FRACTURE;  Surgeon: Kendal Franky SQUIBB, MD;  Location: [facility] OR;  Service: Orthopedics;  Laterality: Left;   ORIF TIBIA PLATEAU Right 11/20/2021   Procedure: OPEN REDUCTION INTERNAL FIXATION (ORIF) TIBIAL PLATEAU;  Surgeon: Kendal Franky SQUIBB, MD;  Location: [facility] OR;  Service: Orthopedics;  Laterality: Right;   PARATHYROIDECTOMY N/A 10/26/2022   Procedure: total PARATHYROIDECTOMY;  Surgeon: Eletha Krystal, MD;  Location: [facility] OR;  Service: General;  Laterality: N/A;   PARATHYROIDECTOMY Right 10/26/2022   Procedure: AUTOTRANSPLANT TO RIGHT FOREARM;  Surgeon: Eletha Krystal, MD;  Location: [facility] OR;  Service: General;  Laterality: Right;    Assessment / Plan /  Recommendation Clinical Impression HPI:  Pt is a 54 year old male with medical hx significant for: ESRD on HD MWF, type II diabetes with diabetic retinopathy, HTN, anemia of chronic disease, hyperlipidemia, PAD, CHF.  Pt  presented to [facility] on 08/07/24 d/t concerns for aphasia and left-sided weakness to left face and hand. Pt had episode of dysphagia and slurred speech the night prior to presentation. Pt hypertensive with systolic of 240. Pt given labetalol  without improvement then given hydralazine  which decreased BP to 180. Pt also noted to be hyperkalemic. CT showed focal cortical and subcortical hyperdensity at left temporal parietal lobe.SABRA MRI showed left posterior MCA stroke. Nephrology and neurology consulted. CTA showed left M2 MCA occlusion, severe PCA P2 stenosis. Pt reported RUE weakness on 1/6. X-ray and repeat MRI ordered. Repeat MRI showed increased edema and slight midline shift. Right shoulder x-ray negative. Panorex ordered d/t dental pain. Noted resorption of maxilla and prominent periapical erosions involving tooth 1 in keeping with advanced periodontal disease. Therapy evaluations completed and CIR recommended d/t pt's deficits in functional mobility.   Clinical Impression:  Bedside Swallow Evaluation: A bedside swallow evaluation was completed to assess for s/sx of oropharyngeal dysphagia. Oral mechanism exam extremely limited secondary to patients apraxia and significant language deficits. POs administered included thin liquids, purees and solids. Patient with mildly prolonged mastication and oral residuals with regular solids, though seemingly improved from prior sessions. No s/sx of aspiration present. Recommend D2/thin diet with use of standardized precautions including sitting upright during PO, taking small bites/sips at a slow rate and caregiver checking for oral clearance. Recommend FULL supervision during mealtimes, only feeding if patient is awake/alert. Cognitive-Linguistic: Patient was evaluated via non-standardized means to assess cognitive linguistic skills. Patient presents with global aphasia + apraxia characterized by significant expressive and receptive language deficits.  Expressive language is characterized by perseveration's, apraxic errors/groping + paraphasias and neologisms. Patient with limited verbal output and occasional approximations during repetition of single syllabic words (name, numbers). Patient unable to name functional objects nor participate in automatic speech tasks. Of note, patient with poor eyesight. Receptive language is remarkable for inability to answer yes/no questions nor follow single step commands without maxA - thought he benefited from extra time and visual cues. Unable to assess cognition at this time due to deficits in language. Pt would benefit from skilled ST services to maximize communication and dysphagia in order to maximize functional independence at d/c. Anticipate patient will require 24 hour supervision at d/c and f/u SLP services.    Skilled Therapeutic Interventions          Patient evaluated using a non-standardized cognitive linguistic assessment and bedside swallow evaluation to assess current cognitive, communicative and swallowing function. See above for details.   SLP Assessment  Patient will need skilled Speech Lanaguage Pathology Services during CIR admission    Recommendations  SLP Diet Recommendations: Age appropriate regular solids;Thin Liquid Administration via: Cup;Straw Medication Administration: Crushed with puree Supervision: Full supervision/cueing for compensatory strategies;Staff to assist with self feeding Compensations: Slow rate;Small sips/bites Postural Changes and/or Swallow Maneuvers: Seated upright 90 degrees;Upright 30-60 min after meal Oral Care Recommendations: Oral care BID Patient destination: Home Follow up Recommendations: Home Health SLP;Outpatient SLP;24 hour supervision/assistance Equipment Recommended: None recommended by SLP    SLP Frequency 3 to 5 out of 7 days   SLP Duration  SLP Intensity  SLP Treatment/Interventions 7-10 days  Minumum of 1-2 x/day, 30 to 90  minutes  Cueing hierarchy;Dysphagia/aspiration precaution training;Functional tasks;Internal/external  aids;Multimodal communication approach;Patient/family education;Speech/Language facilitation;Therapeutic Activities    Pain None visualized   SLP Evaluation Cognition Overall Cognitive Status: Difficult to assess (significant language deficits) Arousal/Alertness: Awake/alert Orientation Level: Oriented to person;Oriented to place Memory: Impaired Awareness: Impaired Awareness Impairment: Intellectual impairment Problem Solving: Impaired Comprehension Auditory Comprehension Overall Auditory Comprehension: Impaired Yes/No Questions: Impaired Basic Biographical Questions: 0-25% accurate Basic Immediate Environment Questions: 0-24% accurate Commands: Impaired One Step Basic Commands: 0-24% accurate Visual Recognition/Discrimination Discrimination:  (DTA due to vision impairments) Expression Verbal Expression Overall Verbal Expression: Impaired Automatic Speech: Name (able to approximate name) Repetition: Impaired Level of Impairment: Word level Naming: Impairment Confrontation: Impaired Verbal Errors: Neologisms;Perseveration Oral Motor Oral Motor/Sensory Function Overall Oral Motor/Sensory Function: Mild impairment (DTA due to language deficits, apraxia) Motor Speech Overall Motor Speech: Impaired Respiration: Within functional limits Phonation: Normal Resonance: Within functional limits Motor Planning: Impaired Level of Impairment: Word  Care Tool Care Tool Cognition Ability to hear (with hearing aid or hearing appliances if normally used Ability to hear (with hearing aid or hearing appliances if normally used): 0. Adequate - no difficulty in normal conservation, social interaction, listening to TV   Expression of Ideas and Wants Expression of Ideas and Wants: 1. Rarely/Never expressess or very difficult - rarely/never expresses self or speech is very difficult to  understand   Understanding Verbal and Non-Verbal Content Understanding Verbal and Non-Verbal Content: 1. Rarely/never understands  Memory/Recall Ability Memory/Recall Ability : None of the above were recalled    Bedside Swallowing Assessment General Previous Swallow Assessment: 1/8 Diet Prior to this Study: Dysphagia 1 (pureed);Thin liquids (Level 0) Respiratory Status: Room air History of Recent Intubation: No Behavior/Cognition: Alert;Cooperative;Requires cueing Oral Cavity - Dentition: Adequate natural dentition Self-Feeding Abilities: Able to feed self;Needs set up Vision: Impaired for self-feeding Patient Positioning: Upright in bed Volitional Cough: Cognitively unable to elicit Volitional Swallow: Unable to elicit  Ice Chips Ice chips: Not tested Thin Liquid Thin Liquid: Within functional limits Presentation: Cup;Straw Nectar Thick Nectar Thick Liquid: Not tested Honey Thick Honey Thick Liquid: Not tested Puree Puree: Within functional limits Presentation: Self Fed;Spoon Solid Solid: Impaired Presentation: Self Fed Oral Phase Impairments: Impaired mastication;Reduced lingual movement/coordination Oral Phase Functional Implications: Prolonged oral transit;Oral residue BSE Assessment Risk for Aspiration Impact on safety and function: Mild aspiration risk Other Related Risk Factors: Cognitive impairment (apraxia)  Short Term Goals: Week 1: SLP Short Term Goal 1 (Week 1): STG=LTG due to ELOS  Refer to Care Plan for Long Term Goals  Recommendations for other services: None   Discharge Criteria: Patient will be discharged from SLP if patient refuses treatment 3 consecutive times without medical reason, if treatment goals not met, if there is a change in medical status, if patient makes no progress towards goals or if patient is discharged from hospital.  The above assessment, treatment plan, treatment alternatives and goals were discussed and mutually agreed upon: by  family  Polk Minor M.A., CCC-SLP 08/15/2024, 3:40 PM

## 2024-08-15 NOTE — Progress Notes (Signed)
 Inpatient Rehabilitation  Patient information reviewed and entered into eRehab system by [facility]. Karen Kays., CCC/SLP, PPS Coordinator.  Information including medical coding, functional ability and quality indicators will be reviewed and updated through discharge.

## 2024-08-15 NOTE — Evaluation (Signed)
 Occupational Therapy Assessment and Plan  Patient Details  Name: Blake Santana MRN: [id number] Date of Birth: 1971-03-10  OT Diagnosis: apraxia, cognitive deficits, disturbance of vision, and hemiplegia affecting dominant side Rehab Potential: Rehab Potential (ACUTE ONLY): Good ELOS: 7-10 days   Today's Date: 08/15/2024 OT Individual Time: 9169-9054 OT Individual Time Calculation (min): 75 min     Pt seen for initial evaluation and ADL training with a focus on family education, balance, use and awareness of R side. Pt completed toileting, shower, dressing tasks - see ADL documentation below. Ambulation in hallway with and without RW., with walker he pushes it too far forward and runs into obstacles.  He seems to be safer without it but continues to need min to mod A.   Reviewed role of OT, discussed POC and pt's goals, and ELOS. Pt resting in wc With all needs met and wife sitting with him.  Hospital Problem: Principal Problem:   CVA (cerebral vascular accident) [facility])   Past Medical History:  Past Medical History:  Diagnosis Date   Acute respiratory failure with hypoxia (HCC) 09/07/2018   Anemia of chronic kidney failure 03/11/2017   Cataracts, bilateral 2019   Chest pain 09/01/2018   minimal luminal irregularities   Chronic diastolic CHF (congestive heart failure) (HCC)    Chronic kidney disease 03/11/2017   Dialysis Tu/Th/Sa   COVID 2022   hospitalized   Diabetes mellitus without complication (HCC) 1999   Edema 01/14/2017   Headache    Hypercholesterolemia    Hypertension 03/11/2017   Hypertensive retinopathy of both eyes 2019   Iron  deficiency anemia 06/18/2016   Pneumonia 09/07/2018   Proliferative diabetic retinopathy with macular edema (HCC) 2019   Retinal hemorrhage, bilateral and left vitreous hemorrhage.  Being treated with Lucentis.  Dr. Raj   RSV (respiratory syncytial virus infection) 08/2017   Past Surgical History:  Past Surgical History:   Procedure Laterality Date   A/V FISTULAGRAM Left 01/19/2018   Procedure: A/V FISTULAGRAM - Left AV;  Surgeon: Sheree Penne Bruckner, MD;  Location: [facility] INVASIVE CV LAB;  Service: Cardiovascular;  Laterality: Left;   AV FISTULA PLACEMENT Left 08/09/2017   Procedure: LEFT RADIOCEPHALIC ARTERIOVENOUS (AV) FISTULA CREATION;  Surgeon: Oris Krystal FALCON, MD;  Location: [facility] OR;  Service: Vascular;  Laterality: Left;   AV FISTULA PLACEMENT Left 02/02/2018   Procedure: ARTERIOVENOUS (AV) FISTULA CREATION LEFT UPPER EXTREMITY;  Surgeon: Oris Krystal FALCON, MD;  Location: [facility] OR;  Service: Vascular;  Laterality: Left;   INSERTION OF DIALYSIS CATHETER  08/09/2017   INSERTION OF DIALYSIS CATHETER Right 08/09/2017   Procedure: INSERTION OF DIALYSIS CATHETER RIGHT INTERNAL JUGULAR;  Surgeon: Oris Krystal FALCON, MD;  Location: [facility] OR;  Service: Vascular;  Laterality: Right;   IR DIALY SHUNT INTRO NEEDLE/INTRACATH INITIAL W/IMG LEFT Left 07/10/2020   IR DIALY SHUNT INTRO NEEDLE/INTRACATH INITIAL W/IMG LEFT Left 04/14/2021   IR REMOVAL TUN CV CATH W/O FL  07/01/2018   LEFT HEART CATH AND CORONARY ANGIOGRAPHY N/A 09/05/2018   Procedure: LEFT HEART CATH AND CORONARY ANGIOGRAPHY;  Surgeon: Dann Candyce RAMAN, MD;  Location: [facility] INVASIVE CV LAB;  Service: Cardiovascular;  Laterality: N/A;   OPEN REDUCTION INTERNAL FIXATION (ORIF) DISTAL RADIAL FRACTURE Left 11/20/2021   Procedure: POSSIBLE OPEN REDUCTION INTERNAL FIXATION (ORIF) DISTAL RADIAL FRACTURE;  Surgeon: Kendal Franky SQUIBB, MD;  Location: [facility] OR;  Service: Orthopedics;  Laterality: Left;   ORIF ANKLE FRACTURE Left 11/20/2021   Procedure: OPEN REDUCTION INTERNAL FIXATION (ORIF) ANKLE PILON FRACTURE;  Surgeon: Kendal Franky SQUIBB, MD;  Location: [facility] OR;  Service: Orthopedics;  Laterality: Left;   ORIF TIBIA PLATEAU Right 11/20/2021   Procedure: OPEN REDUCTION INTERNAL FIXATION (ORIF) TIBIAL PLATEAU;  Surgeon: Kendal Franky SQUIBB, MD;  Location: [facility] OR;  Service: Orthopedics;  Laterality: Right;    PARATHYROIDECTOMY N/A 10/26/2022   Procedure: total PARATHYROIDECTOMY;  Surgeon: Eletha Boas, MD;  Location: [facility] OR;  Service: General;  Laterality: N/A;   PARATHYROIDECTOMY Right 10/26/2022   Procedure: AUTOTRANSPLANT TO RIGHT FOREARM;  Surgeon: Eletha Boas, MD;  Location: [facility] OR;  Service: General;  Laterality: Right;    Assessment & Plan Clinical Impression: .Blake Santana is a 54 year old male with PMHx ESRD on HD TTS, HFpEF, DM2 with diabetic retinopathy, anemia of chronic disease, HTN, HLD, PAD who presented to [facility] on 08/06[facility]24 with difficulty speaking and choking with dinner the night prior. Slurred speech and left sided facial numbness returned around noon. In the BP 231/94 and labs revealed Na 134, K 6.4, BUN 50, cr 1036, Ca 8.2, alb 4.1 and WBC 8 K. NIHSS 7. CT head showed focal cortical and subcortical hypodensity at the left temporoparietal lobe suspicious for acute or subacute infarct. Neurology consulted and patient underwent MRI showed an acute left posterior MCA territory infarct involving the left anterior temporal lobe, overlying parietal lobe and posterior left insula, some edema without mass effect.  CTA showed left M2 MCA occlusion, severe PCA P2 stenosis.  Patient reported right upper extremity weakness on 1/6 therefore underwent repeat MRI which showed increase edema and slight midline shift.  Neurology recommended continuation of DAPT antithrombotic asprin and plavix  x 21 days followed by aspirin  monotherapy. A right shoulder x-ray was negative. Nephrology was consulted for management of ESRD and hyperkalemia.  Panorex was ordered due to complaints of dental pain and noted reabsorption of maxilla and prominent periapical erosions involving tooth 1, advanced peridental disease.  Symptoms have improved with a course of Unasyn .  Patient discharged on Augmentin  to complete 7 days.  He will need to follow-up outpatient for dental care.     Per chart review the  patient lives in a 1 level home with 2-3 steps to enter with his spouse.  Prior to arrival he was independent but did not drive at baseline due to visual issues.  He currently requires CGA to min assist with mobility and min assist to max assist with basic ADLs.Therapy evaluations completed due to patient decreased functional mobility was admitted for a comprehensive rehab program.      Patient transferred to CIR on 08/14/2024 .    Patient currently requires mod with basic self-care skills secondary to unbalanced muscle activation, motor apraxia, and decreased coordination, decreased visual acuity and decreased visual perceptual skills, decreased attention to right, decreased awareness, decreased problem solving, decreased safety awareness, decreased memory, and delayed processing, and decreased sitting balance, decreased standing balance, decreased postural control, hemiplegia, and decreased balance strategies.  Prior to hospitalization, patient could complete BADLS with independent .  Patient will benefit from skilled intervention to increase independence with basic self-care skills prior to discharge home with care partner.  Anticipate patient will require minimal physical assistance and follow up home health.  OT - End of Session Activity Tolerance: Tolerates 30+ min activity with multiple rests Endurance Deficit: Yes OT Assessment Rehab Potential (ACUTE ONLY): Good OT Barriers to Discharge: None OT Patient demonstrates impairments in the following area(s): Balance;Cognition;Pain;Motor;Endurance;Perception;Safety;Sensory;Vision OT Basic ADL's Functional Problem(s): Eating;Grooming;Bathing;Dressing;Toileting OT Advanced ADL's Functional Problem(s):  None OT Transfers Functional Problem(s): Toilet;Tub/Shower OT Additional Impairment(s): Fuctional Use of Upper Extremity OT Plan OT Intensity: Minimum of 1-2 x/day, 45 to 90 minutes OT Frequency: 5 out of 7 days OT Duration/Estimated Length of  Stay: 7-10 days OT Treatment/Interventions: Balance/vestibular training;Cognitive remediation/compensation;Discharge planning;DME/adaptive equipment instruction;Functional mobility training;Neuromuscular re-education;Patient/family education;Self Care/advanced ADL retraining;Therapeutic Activities;Therapeutic Exercise;UE/LE Strength taining/ROM;UE/LE Coordination activities;Visual/perceptual remediation/compensation OT Self Feeding Anticipated Outcome(s): independent OT Basic Self-Care Anticipated Outcome(s): CGA OT Toileting Anticipated Outcome(s): supervision OT Bathroom Transfers Anticipated Outcome(s): CGA OT Recommendation Recommendations for Other Services: None Patient destination: Home Follow Up Recommendations: Home health OT Equipment Recommended: To be determined   OT Evaluation Precautions/Restrictions  Precautions Precautions: Fall;Other (comment) Recall of Precautions/Restrictions: Impaired Precaution/Restrictions Comments: aphasic, 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 Restrictions Weight Bearing Restrictions Per Provider Order: No    Vital Signs Therapy Vitals Temp: 98.2 F (36.8 C) Temp Source: Oral Pulse Rate: 60 Resp: 18 BP: (!) 143/73 Patient Position (if appropriate): Lying Oxygen Therapy SpO2: 99 % O2 Device: Room Air Pain Pain Assessment Faces Pain Scale: Hurts whole lot Pain Location: Arm Pain Orientation: Right Pain Descriptors / Indicators: Grimacing Pain Onset: With Activity (with touch) Pain Intervention(s): RN made aware Home Living/Prior Functioning Home Living Family/patient expects to be discharged to:: Private residence Living Arrangements: Spouse/significant other Type of Home: House Home Access: Stairs to enter Secretary/administrator of Steps: 2-3 Home Layout: One level Bathroom Shower/Tub: Engineer, Manufacturing Systems: Standard  Lives With: Significant other, Family Prior Function Level of Independence: Independent with basic ADLs  Able  to Take Stairs?: Yes Driving: No Vision Baseline Vision/History: 4 Cataracts;2 Legally blind Ability to See in Adequate Light: 4 Severely impaired Patient Visual Report: Other (comment);Blurring of vision (R eye visual clarity worse (per wife)) Vision Assessment?: Vision impaired- to be further tested in functional context Additional Comments: pt unable to follow my hand for tracking but able to see shower seat, toilet, wc to sit in safely Perception  Perception: Impaired Perception-Other Comments: R arm inattention noted in ADLS but pt also grimacing with movement so he was not wanting to use arm Praxis Praxis: Impaired Praxis Impairment Details: Motor planning;Ideomotor Cognition Cognition Overall Cognitive Status: Difficult to assess Arousal/Alertness: Awake/alert Orientation Level: Person Comments: pt unable to answer questions, but did follow 1 step directions Brief Interview for Mental Status (BIMS) Repetition of Three Words (First Attempt): No answer (aphasia) Temporal Orientation: Year: No answer Temporal Orientation: Month: No answer Temporal Orientation: Day: No answer Recall: Sock: No answer Recall: Blue: No answer Recall: Bed: No answer BIMS Summary Score: 99 Sensation Sensation Light Touch: Impaired Detail Peripheral sensation comments: difficult to assess Light Touch Impaired Details: Impaired RUE Proprioception: Impaired by gross assessment Stereognosis: Impaired by gross assessment Additional Comments: limited in RUE Coordination Gross Motor Movements are Fluid and Coordinated: No Fine Motor Movements are Fluid and Coordinated: No Coordination and Movement Description: impaired in RUE, able to use as a gross A in self care; incoordination with eccentric movement patterns Finger Nose Finger Test: unable to test due to impaired vision Motor  Motor Motor: Hemiplegia  Trunk/Postural Assessment    R lean during ambulation  Balance Dynamic Sitting  Balance Dynamic Sitting - Level of Assistance: 5: Stand by assistance Static Standing Balance Static Standing - Level of Assistance: 4: Min assist Dynamic Standing Balance Dynamic Standing - Level of Assistance: 3: Mod assist Extremity/Trunk Assessment RUE Assessment Active Range of Motion (AROM) Comments: WFL at shoulder/ elbow, unable to assess at wrist due to pt grimacing  in discomfort General Strength Comments: grasp 3+ with grimacing face, shoulder 3+ LUE Assessment General Strength Comments: 4/5  Care Tool Care Tool Self Care Eating   Eating Assist Level: Supervision/Verbal cueing    Oral Care    Oral Care Assist Level: Minimal Assistance - Patient > 75%    Bathing   Body parts bathed by patient: Chest;Abdomen;Front perineal area;Right upper leg;Left upper leg;Right lower leg;Left lower leg;Face;Right arm Body parts bathed by helper: Left arm;Buttocks   Assist Level: Minimal Assistance - Patient > 75%    Upper Body Dressing(including orthotics)   What is the patient wearing?: Pull over shirt   Assist Level: Maximal Assistance - Patient 25 - 49%    Lower Body Dressing (excluding footwear)   What is the patient wearing?: Underwear/pull up;Pants Assist for lower body dressing: Minimal Assistance - Patient > 75%    Putting on/Taking off footwear   What is the patient wearing?: Non-skid slipper socks Assist for footwear: Minimal Assistance - Patient > 75%       Care Tool Toileting Toileting activity   Assist for toileting: Moderate Assistance - Patient 50 - 74%     Care Tool Bed Mobility Roll left and right activity        Sit to lying activity        Lying to sitting on side of bed activity         Care Tool Transfers Sit to stand transfer   Sit to stand assist level: Contact Guard/Touching assist    Chair/bed transfer   Chair/bed transfer assist level: Moderate Assistance - Patient 50 - 74%     Toilet transfer   Assist Level: Moderate Assistance -  Patient 50 - 74%     Care Tool Cognition  Expression of Ideas and Wants Expression of Ideas and Wants: 1. Rarely/Never expressess or very difficult - rarely/never expresses self or speech is very difficult to understand  Understanding Verbal and Non-Verbal Content Understanding Verbal and Non-Verbal Content: 2. Sometimes understands - understands only basic conversations or simple, direct phrases. Frequently requires cues to understand   Memory/Recall Ability Memory/Recall Ability : None of the above were recalled   Refer to Care Plan for Long Term Goals  SHORT TERM GOAL WEEK 1 OT Short Term Goal 1 (Week 1): STGs = LTGs  Recommendations for other services: None    Skilled Therapeutic Intervention ADL ADL Eating: Supervision/safety Grooming: Minimal assistance Upper Body Bathing: Minimal assistance Where Assessed-Upper Body Bathing: Shower Lower Body Bathing: Minimal assistance Where Assessed-Lower Body Bathing: Shower Upper Body Dressing: Moderate assistance Where Assessed-Upper Body Dressing: Chair Lower Body Dressing: Minimal assistance Where Assessed-Lower Body Dressing: Chair Toileting: Moderate assistance Where Assessed-Toileting: Teacher, Adult Education: Moderate assistance Toilet Transfer Method: Proofreader: Acupuncturist: Moderate assistance Film/video Editor Method: Designer, Industrial/product: Grab bars      Discharge Criteria: Patient will be discharged from OT if patient refuses treatment 3 consecutive times without medical reason, if treatment goals not met, if there is a change in medical status, if patient makes no progress towards goals or if patient is discharged from hospital.  The above assessment, treatment plan, treatment alternatives and goals were discussed and mutually agreed upon: by patient and by family  Daking Westervelt 08/15/2024, 10:43 AM

## 2024-08-16 LAB — GLUCOSE, CAPILLARY
Glucose-Capillary: 103 mg/dL — ABNORMAL HIGH (ref 70–99)
Glucose-Capillary: 133 mg/dL — ABNORMAL HIGH (ref 70–99)
Glucose-Capillary: 118 mg/dL — ABNORMAL HIGH (ref 70–99)
Glucose-Capillary: 227 mg/dL — ABNORMAL HIGH (ref 70–99)

## 2024-08-16 LAB — HEPATITIS B SURFACE ANTIBODY, QUANTITATIVE: Hep B S AB Quant (Post): 38.1 m[IU]/mL

## 2024-08-16 LAB — MAGNESIUM: Magnesium: 2.5 mg/dL — ABNORMAL HIGH (ref 1.7–2.4)

## 2024-08-16 NOTE — Progress Notes (Signed)
 "                                                        PROGRESS NOTE   Subjective/Complaints:  Ipad interpreter used ,wife at bedside Severe global aphasia  ROS- cannot obtain  Objective:   No results found. Recent Labs    08/14/24 0239  WBC 7.6  HGB 10.7*  HCT 32.6*  PLT 146*   Recent Labs    08/14/24 0239  NA 135  K 4.9  CL 93*  CO2 26  GLUCOSE 134*  BUN 50*  CREATININE 10.30*  CALCIUM  7.1*    Intake/Output Summary (Last 24 hours) at 08/16/2024 0733 Last data filed at 08/15/2024 1808 Gross per 24 hour  Intake 480 ml  Output --  Net 480 ml        Physical Exam: Vital Signs Blood pressure (!) 149/72, pulse 62, temperature 98.1 F (36.7 C), resp. rate 18, height 5' 2 (1.575 m), weight 68 kg, SpO2 97%.   General: No acute distress Mood and affect are appropriate Heart: Regular rate and rhythm no rubs murmurs or extra sounds Lungs: Clear to auscultation, breathing unlabored, no rales or wheezes Abdomen: Positive bowel sounds, soft nontender to palpation, nondistended Extremities: No clubbing, cyanosis, or edema Skin: No evidence of breakdown, no evidence of rash Neurologic: Cranial nerves II through XII intact, motor strength is difficult to assess to to comprehension deficit, responds to gestural cues is antigravity in all 4 limbs but stronger on Left than on the right  Sensory exam withdrws to pinch in BUE but formal testing limited by aphasia  No words attempted , cannot follow verbal commands even with interpreter assist  Needs gestural cues  Cerebellar exam cannot cooperate with exam  Musculoskeletal:no pain with  range of motion in all 4 extremities. No joint swelling   Assessment/Plan: 1. Functional deficits which require 3+ hours per day of interdisciplinary therapy in a comprehensive inpatient rehab setting. Physiatrist is providing close team supervision and 24 hour management of active medical problems listed below. Physiatrist and rehab  team continue to assess barriers to discharge/monitor patient progress toward functional and medical goals  Care Tool:  Bathing    Body parts bathed by patient: Chest, Abdomen, Front perineal area, Right upper leg, Left upper leg, Right lower leg, Left lower leg, Face, Right arm   Body parts bathed by helper: Left arm, Buttocks     Bathing assist Assist Level: Minimal Assistance - Patient > 75%     Upper Body Dressing/Undressing Upper body dressing   What is the patient wearing?: Pull over shirt    Upper body assist Assist Level: Maximal Assistance - Patient 25 - 49%    Lower Body Dressing/Undressing Lower body dressing      What is the patient wearing?: Underwear/pull up, Pants     Lower body assist Assist for lower body dressing: Minimal Assistance - Patient > 75%     Toileting Toileting    Toileting assist Assist for toileting: Moderate Assistance - Patient 50 - 74%     Transfers Chair/bed transfer  Transfers assist     Chair/bed transfer assist level: Minimal Assistance - Patient > 75%     Locomotion Ambulation   Ambulation assist      Assist level: Minimal Assistance - Patient > 75%  Assistive device: No Device Max distance: 170ft   Walk 10 feet activity   Assist     Assist level: Minimal Assistance - Patient > 75% Assistive device: No Device   Walk 50 feet activity   Assist    Assist level: Minimal Assistance - Patient > 75% Assistive device: No Device    Walk 150 feet activity   Assist Walk 150 feet activity did not occur: Safety/medical concerns (fatigue, weakness)         Walk 10 feet on uneven surface  activity   Assist Walk 10 feet on uneven surfaces activity did not occur: Safety/medical concerns (fatigue, weakness)         Wheelchair     Assist Is the patient using a wheelchair?: Yes Type of Wheelchair: Manual    Wheelchair assist level: Dependent - Patient 0% Max wheelchair distance: >320ft     Wheelchair 50 feet with 2 turns activity    Assist        Assist Level: Dependent - Patient 0%   Wheelchair 150 feet activity     Assist      Assist Level: Dependent - Patient 0%   Blood pressure (!) 149/72, pulse 62, temperature 98.1 F (36.7 C), resp. rate 18, height 5' 2 (1.575 m), weight 68 kg, SpO2 97%.  Medical Problem List and Plan: 1. Functional deficits secondary to Left MCA infarct             -patient may  shower             -ELOS/Goals: 7-10d , Sup-MinA goals, evals today , team conf in am   Team conference today please see physician documentation under team conference tab, met with team  to discuss problems,progress, and goals. Formulized individual treatment plan based on medical history, underlying problem and comorbidities.  2.  Antithrombotics: -DVT/anticoagulation:  Mechanical: Sequential compression devices, below knee Bilateral lower extremities Pharmaceutical: Heparin              -antiplatelet therapy: Aspirin  and Plavix  for 3 months, then followed by aspirin  alone.   3. Pain Management: Tylenol  prn    4. Mood/Behavior/Sleep: LCSW to follow for evaluation and support when available.              -antipsychotic agents: n/a             -sleep: Trazadone prn    5. Neuropsych/cognition: This patient is not capable of making decisions on his  own behalf.   6. Skin/Wound Care: Routine pressure relief measures.    7. Fluids/Electrolytes/Nutrition: Monitor I&O and weight. Follow up labs monitored by HD.    8.  Acute CVA: Continue DAPT aspirin  and Plavix  for 3 months, then followed by aspirin  alone.  Continue Crestor  and Zetia .  Follow-up outpatient with cardiology for heart monitor.   9.  ESRD: On HD TTS, nephrology following.   10.  Chronic HFpEF: Continue fluid removal per HD.    11.  Type 2 diabetes: A1c 8.5, poorly controlled.  Used glipizide  10 mg at home. -Monitor glucose ACHS with SSI NovoLog . CBG (last 3)  Recent Labs     08/15/24 1639 08/15/24 2118 08/16/24 0551  GLUCAP 243* 175* 103*  Am CBG in desired range     12.  Anemia of chronic disease: Hemoglobin 10.7.  Monitor CBC.    13.  Hyperlipidemia/CAD: Continue statin, zetia , aspirin  and plavix .    14.  Dental pain: Received IV Unasyn  transition Continue Augmentin  x 4 days f/u dental  as OP           LOS: 2 days A FACE TO FACE EVALUATION WAS PERFORMED  Blake Santana 08/16/2024, 7:33 AM     "

## 2024-08-16 NOTE — Progress Notes (Signed)
 " [facility] Progress Note   Subjective:    Seen and examined patient at bedside. Patient's family member, PT, and [facility]'s interpreter also at bedside. He's currently c/o headache and neck pain. Denies SOB, CP, and N/V.   Objective Vitals:   08/15/24 1602 08/15/24 2118 08/16/24 0553 08/16/24 0815  BP: 139/61 (!) 155/69 (!) 149/72 (!) 149/72  Pulse: 64 64 62   Resp: 18 18 18    Temp: 97.7 F (36.5 C) 98.6 F (37 C) 98.1 F (36.7 C)   TempSrc:      SpO2: 95% 98% 97%   Weight:      Height:       Physical Exam General: Awake, alert, mildly frail, NAD Heart: S1 and S2; No murmurs, gallops, or rubs Lungs: Clear throughout Abdomen: Soft and non-tender Extremities: No LE edema Dialysis Access: AVF   Filed Weights   08/14/24 1832 08/15/24 0500  Weight: 67.7 kg 68 kg    Intake/Output Summary (Last 24 hours) at 08/16/2024 1155 Last data filed at 08/15/2024 1808 Gross per 24 hour  Intake 480 ml  Output --  Net 480 ml    Additional Objective Labs: Basic Metabolic Panel: Recent Labs  Lab 08/10/24 0215 08/13/24 0136 08/14/24 0239  NA 132* 134* 135  K 3.9 4.5 4.9  CL 91* 92* 93*  CO2 29 28 26   GLUCOSE 116* 106* 134*  BUN 22* 35* 50*  CREATININE 6.48* 8.65* 10.30*  CALCIUM  7.7* 7.4* 7.1*   Liver Function Tests: No results for input(s): AST, ALT, ALKPHOS, BILITOT, PROT, ALBUMIN in the last 168 hours. No results for input(s): LIPASE, AMYLASE in the last 168 hours. CBC: Recent Labs  Lab 08/10/24 0215 08/14/24 0239  WBC 7.5 7.6  NEUTROABS 4.5 4.2  HGB 11.0* 10.7*  HCT 32.3* 32.6*  MCV 93.6 95.0  PLT 139* 146*   Blood Culture    Component Value Date/Time   SDES BLOOD RIGHT FOREARM 03/29/2020 2250   SPECREQUEST  03/29/2020 2250    BOTTLES DRAWN AEROBIC AND ANAEROBIC Blood Culture adequate volume   CULT  03/29/2020 2250    NO GROWTH 5 DAYS Performed at [facility], [address], [place], [place] [address]    REPTSTATUS  04/03/2020 FINAL 03/29/2020 2250    Cardiac Enzymes: No results for input(s): CKTOTAL, CKMB, CKMBINDEX, TROPONINI in the last 168 hours. CBG: Recent Labs  Lab 08/15/24 1201 08/15/24 1639 08/15/24 2118 08/16/24 0551 08/16/24 1124  GLUCAP 203* 243* 175* 103* 133*   Iron  Studies: No results for input(s): IRON , TIBC, TRANSFERRIN, FERRITIN in the last 72 hours. Lab Results  Component Value Date   INR 1.0 08/06/2024   INR 1.1 11/17/2021   INR 1.26 09/02/2018   Studies/Results: No results found.  Medications:   amLODipine   10 mg Oral Daily   amoxicillin -clavulanate  1 tablet Oral Daily   carvedilol   12.5 mg Oral BID WC   Chlorhexidine  Gluconate Cloth  6 each Topical Q0600   Chlorhexidine  Gluconate Cloth  6 each Topical Q0600   clopidogrel   75 mg Oral Daily   ezetimibe   10 mg Oral Daily   heparin   5,000 Units Subcutaneous Q8H   insulin  aspart  0-5 Units Subcutaneous QHS   insulin  aspart  0-6 Units Subcutaneous TID WC   rosuvastatin   10 mg Oral Daily    D[facility] Orders: [facility] 3h B350 65.2kg  AVF  Hep 2000  Last OP HD 1/02, post wt 67.2kg Hep B SAg neg 07/10/24  Heb B SAb +Immune 05/15/24  Assessment/Plan: ESRD - on HD MWF - next HD this afternoon   Acute CVA  - L posterior MCA territory per MRI - aphasia - had CVA extension  -per primary, in CIR   HTN/Volume - on norvasc  (coreg  dc'd)  - per neuro -> try to maintain SBP 130-150 given L M2 occlusion -will UF as tolerated   Anemia of esrd - Hb 10.7, follow, no esa needs yet   Secondary hyperparathyroidism -check phos in AM   Dispo: in CIR  Charmaine Piety, NP [facility] 08/16/2024,11:56 AM  LOS: 2 days    "

## 2024-08-16 NOTE — Progress Notes (Signed)
 Occupational Therapy Session Note  Patient Details  Name: Blake Santana MRN: [id number] Date of Birth: 09/09/1970  Today's Date: 08/16/2024 OT Individual Time: 8964-8879 OT Individual Time Calculation (min): 45 min    Short Term Goals: Week 1:  OT Short Term Goal 1 (Week 1): STGs = LTGs  Skilled Therapeutic Interventions/Progress Updates:    Pt received in bed with spouse and interpretor present.  Pt's wife stated pt had a bad headache and not feeling well. Pt nodding yes his head hurt.   Suggested that he take a warm shower and take his time in the session and that he may feel better. Pt agreeable  to that.  Pt then started moving fairly efficiently and did not grimace using his hand except when trying to squeeze and hold onto a bottle.  CGA to ambulate to toilet and doff clothing without A,  CGA to step into shower.  Min A to wash L arm, but pt did try to use R hand to do so.  Stood CGA to wash his buttocks without A.  Ambulated to bed to dress. Today no A or cues to don shirt! Donned underwear and pants CGA with balance in standing and Socks with no cues or A but did take him extra time as he worked with using R hand.  Good participation in therapy and pt nodded yes when asked if he felt better.  Had pt lay down to rest until the PT came for his next session.   Bed alarm on and all needs met.    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 Restrictions Weight Bearing Restrictions Per Provider Order: No       Pain: Pain Assessment Pain Scale: 0-10 Pain Score: 8  Pain Location: Head Pain Intervention(s): Medication (See eMAR) ADL: ADL Eating: Supervision/safety Grooming: Minimal assistance Upper Body Bathing: Minimal assistance Where Assessed-Upper Body Bathing: Shower Lower Body Bathing: Contact guard Where  Assessed-Lower Body Bathing: Shower Upper Body Dressing: Supervision/safety Where Assessed-Upper Body Dressing: Chair Lower Body Dressing: Contact guard Where Assessed-Lower Body Dressing: Chair Toileting: Moderate assistance Where Assessed-Toileting: Teacher, Adult Education: Moderate assistance Toilet Transfer Method: Proofreader: Acupuncturist: Moderate assistance Film/video Editor Method: Designer, Industrial/product: Grab bars  Therapy/Group: Individual Therapy  Lennell Shanks 08/16/2024, 12:44 PM

## 2024-08-16 NOTE — IPOC Note (Signed)
 Overall Plan of Care [facility]) Patient Details Name: Blake Santana MRN: [id number] DOB: 11-21-1970  Admitting Diagnosis: CVA (cerebral vascular accident) [facility])  Hospital Problems: Principal Problem:   CVA (cerebral vascular accident) [facility])     Functional Problem List: Nursing Endurance, Edema, Bowel, Pain, Safety  PT Balance, Endurance, Motor, Perception, Safety, Sensory, Skin Integrity, Pain  OT Balance, Cognition, Pain, Motor, Endurance, Perception, Safety, Sensory, Vision  SLP Cognition, Nutrition, Linguistic  TR         Basic ADLs: OT Eating, Grooming, Bathing, Dressing, Toileting     Advanced  ADLs: OT None     Transfers: PT Bed Mobility, Bed to Chair, Car, [employer], Floor  OT Toilet, Research Scientist (life Sciences): PT Ambulation, Psychologist, [employer], Stairs     Additional Impairments: OT Fuctional Use of Upper Extremity  SLP Swallowing, Communication, Social Cognition comprehension, expression Awareness  TR      Anticipated Outcomes Item Anticipated Outcome  Self Feeding independent  Swallowing  minA   Basic self-care  CGA  Toileting  supervision   Bathroom Transfers CGA  Bowel/Bladder  manager bowels with medications/ patient is oliguric  Transfers  supervision with LRAD  Locomotion  supervision with LRAD  Communication  maxA  Cognition     Pain  <4 w/ prn  Safety/Judgment  Manage safety with supervision assistance   Therapy Plan: PT Intensity: Minimum of 1-2 x/day ,45 to 90 minutes PT Frequency: 5 out of 7 days PT Duration Estimated Length of Stay: 7-10 days OT Intensity: Minimum of 1-2 x/day, 45 to 90 minutes OT Frequency: 5 out of 7 days OT Duration/Estimated Length of Stay: 7-10 days SLP Intensity: Minumum of 1-2 x/day, 30 to 90 minutes SLP Frequency: 3 to 5 out of 7 days SLP Duration/Estimated Length of Stay: 7-10 days   Team Interventions: Nursing Interventions Patient/Family Education, Bowel Management, Disease  Management/Prevention, Pain Management, Medication Management, Discharge Planning, Dysphagia/Aspiration Precaution Training  PT interventions Ambulation/gait training, Discharge planning, Functional mobility training, Psychosocial support, Therapeutic Activities, Visual/perceptual remediation/compensation, Balance/vestibular training, Disease management/prevention, Neuromuscular re-education, Skin care/wound management, Therapeutic Exercise, Wheelchair propulsion/positioning, Cognitive remediation/compensation, DME/adaptive equipment instruction, Pain management, Splinting/orthotics, UE/LE Strength taining/ROM, Community reintegration, Development worker, international aid stimulation, Patient/family education, Museum/gallery curator, UE/LE Coordination activities  OT Interventions Warden/ranger, Cognitive remediation/compensation, Discharge planning, DME/adaptive equipment instruction, Functional mobility training, Neuromuscular re-education, Patient/family education, Self Care/advanced ADL retraining, Therapeutic Activities, Therapeutic Exercise, UE/LE Strength taining/ROM, UE/LE Coordination activities, Visual/perceptual remediation/compensation  SLP Interventions Cueing hierarchy, Dysphagia/aspiration precaution training, Functional tasks, Internal/external aids, Multimodal communication approach, Patient/family education, Speech/Language facilitation, Therapeutic Activities  TR Interventions    SW/CM Interventions Discharge Planning, Psychosocial Support, Patient/Family Education   Barriers to Discharge MD  Hemodialysis  Nursing Decreased caregiver support, Home environment access/layout, Hemodialysis Discharge: House  Discharge Home Layout: One level  Discharge Home Access: Stairs to enter  Entrance Stairs-Rails: None  Entrance Stairs-Number of Steps: 2-3  PT Home environment access/layout, Hemodialysis, Behavior, Other (comments) aphasia, apraxia, hemiparesis, decreased balance/coordination, impaired motor  planning/sequencing  OT None    SLP      SW Other (comments) no insurance   Team Discharge Planning: Destination: PT-Home ,OT- Home , SLP-Home Projected Follow-up: PT-Outpatient PT, OT-  Home health OT, SLP-Home Health SLP, Outpatient SLP, 24 hour supervision/assistance Projected Equipment Needs: PT-To be determined, OT- To be determined, SLP-None recommended by SLP Equipment Details: PT-has WC and bedside commode, OT-  Patient/family involved in discharge planning: PT- Patient, Family member/caregiver,  OT-Patient, Family member/caregiver, SLP-Family member/caregiver  MD ELOS:  12-14d Medical Rehab Prognosis:  Good Assessment: The patient has been admitted for CIR therapies with the diagnosis of CVA. The team will be addressing functional mobility, strength, stamina, balance, safety, adaptive techniques and equipment, self-care, bowel and bladder mgt, patient and caregiver education, ESRD on HD. Goals have been set at Mod I. Anticipated discharge destination is home.        See Team Conference Notes for weekly updates to the plan of care

## 2024-08-16 NOTE — Progress Notes (Signed)
 Speech Language Pathology Daily Session Note  Patient Details  Name: Blake Santana MRN: [id number] Date of Birth: Feb 26, 1971  Today's Date: 08/16/2024 SLP Individual Time: 0900-0930 SLP Individual Time Calculation (min): 30 min  Short Term Goals: Week 1: SLP Short Term Goal 1 (Week 1): STG=LTG due to ELOS  Skilled Therapeutic Interventions: Skilled therapy session focused on communication goals. SLP facilitated session by prompting patient to verbalize name given verbal and written cues. Patient recalled name clearly this date with maxA! SLP then targeted automatic speech tasks. Patient approximated numbers 1-3 given maxA, however unable to recall numbers 4-5. SLP then attempted confrontational naming task, however patient began grabbing head and grimacing. SLP turned down lights and notified nurse. Patient reporting 8/10 pain given scale and requested to d/c tx this date (through yes/no questions). Patient with seemingly improved receptive language this date, responding to pain questions appropriately. Patient left in bed with RN and family present. 30 minutes of session missed due to pain. Continue POC  Pain 8/10 headache, RN aware providing pain medications   Therapy/Group: Individual Therapy  Chrysta Fulcher M.A., CCC-SLP 08/16/2024, 7:41 AM

## 2024-08-16 NOTE — Patient Care Conference (Signed)
 Inpatient RehabilitationTeam Conference and Plan of Care Update Date: 08/16/2024   Time: 1:14 PM    Patient Name: Blake Santana      Medical Record Number: [id number]  Date of Birth: 10/14/1970 Sex: Male         Room/Bed: 4M13C/4M13C-01 Payor Info: Payor: MEDICAID POTENTIAL / Plan: MEDICAID POTENTIAL / Product Type: *No Product type* /    Admit Date/Time:  08/14/2024  5:43 PM  Primary Diagnosis:  CVA (cerebral vascular accident) [facility])  Hospital Problems: Principal Problem:   CVA (cerebral vascular accident) [facility])    Expected Discharge Date: Expected Discharge Date: 08/25/24  Team Members Present: Physician leading conference: Dr. Prentice Compton Social Worker Present: Rhoda Clement, LCSW Nurse Present: Barnie Ronde, RN PT Present: Sherlean Perks, PT;Dominic Sandoval, PTA OT Present: Recardo Maxwell, OT SLP Present: Blaise Alderman, SLP     Current Status/Progress Goal Weekly Team Focus  Bowel/Bladder   Pt is oliguric, Continent of bowel       Assist with toileting needs    Swallow/Nutrition/ Hydration   D2/thin   minA  tolerance, use of compensatory strategies for oral clearance, diet upgrade    ADL's   min - mod A   supervision to CGA   RUE NMR, balance, R side awareness, pt/fam education , ADL training    Mobility   overall minA with ambulation without AD and transfers  and stairs and supervision with bed mobility   overall supervision and CGA for stairs  barriers = aphasia, safe d/c home: focus, NMRE, stairs, ambulation, family ed, spatial/safety awareness,    Communication   profound global aphasia, apraxia   maxA   method of communication, yes/no, receptive language    Safety/Cognition/ Behavioral Observations               Pain   Pt makes facial expression related to pain when handling the R arm, Prn meds given   <3 pain score   Assess qshift and prn    Skin   Skin intact   Remain intact  Assess qshift and prn       Discharge Planning:  Home with girlfreind and children between all of them can have 24/7 care. Girlfriend works 7p-10p daily. Aware will not be eligible for services at discharge due to uninsured. Will try to get charity equipment and give pro bono clinics resources   Team Discussion: Patient admitted post CVA with history of orthopedic injury; ambulates with a limp; now with poor proprioception, visual deficits, retinopathy aphasia and apraxia.  Progress limited by visual deficits and pain with perceptual deficits.  Patient on target to meet rehab goals: no, currently needs min assist for ADLs and shower transfers. Needs min assist to ambulate and navigate steps. Goals for discharge set for supervision - CGA overall and Max assist for communication.  *See Care Plan and progress notes for long and short-term goals.   Revisions to Treatment Plan:  N/a   Teaching Needs: Safety, medications, dietary modification, transfers, toileting, etc.   Current Barriers to Discharge: Decreased caregiver support, Home enviroment access/layout, and Hemodialysis and lack of insurance for follow up services. Home currently in foreclosure status and S.O. works.  Possible Resolutions to Barriers: Family education     Medical Summary Current Status: Aphasic adn apraxic, ESRD  Barriers to Discharge: Renal Insufficiency/Failure   Possible Resolutions to [employer] Focus: nephro consult,work on alternative communication strategies   Continued Need for Acute Rehabilitation Level of Care: The patient requires daily medical management by  a physician with specialized training in physical medicine and rehabilitation for the following reasons: Direction of a multidisciplinary physical rehabilitation program to maximize functional independence : Yes Medical management of patient stability for increased activity during participation in an intensive rehabilitation regime.: Yes Analysis of laboratory values  and/or radiology reports with any subsequent need for medication adjustment and/or medical intervention. : Yes   I attest that I was present, lead the team conference, and concur with the assessment and plan of the team.   Fredericka Sober B 08/16/2024, 1:14 PM

## 2024-08-16 NOTE — Progress Notes (Signed)
 Physical Therapy Session Note  Patient Details  Name: Dominik Yordy MRN: [id number] Date of Birth: 1971-01-20  Today's Date: 08/16/2024 PT Individual Time: 9196-9155; 1120 - 1201 PT Individual Time Calculation (min): 41 min; 41 min   Short Term Goals: Week 1:  PT Short Term Goal 1 (Week 1): STG=LTG due to LOS  SESSION 1 Skilled Therapeutic Interventions/Progress Updates: Patient semi-reclined with wife and interpretor present on entrance to room. Patient alert and agreeable to PT session.   Patient with grimacing face and unrated pain in R UE. Care team aware with previous day thinking it was from IV (IV no longer in place) but pt continues to report pain and is difficult to assess due to global aphasia. PT wife reported that pt can seemingly understand more than he can express. Pt sat to EOB with supervision and HOB elevated. Pt sat on EOB and cued to donn grip socks over personal socks briefly to safely stand and doff/donn personal pants. Pt with apraxic presentation and donned sock onto hand and required 2nd attempt and was able to donn on L foot after PTA demonstrated placement of sock on R foot. Pt stood to RW with max cuing for pt to place R hand on handle of RW but would reach for front bar of RW or miss handle entirely to the R. Pt required manual placement of hand. Pt stood while pt wife assisted with doffing pants with pt requiring minA to perform single leg stance to thread LE through pant leg. Pt performed stand step transfer to [facility] without AD and with overall minA and visual cue for hand placement. Pt transported to main gym and ambulated roughly 100' without AD and with minA with decreased safety awareness (pt noted to be blind in L eye and to have blurry vision in R).  Pt with decreased B LE clearance and with somewhat of a limp on L side (pt wife reported that this was the case prior to arrival due to past fractures in L LE). Pt also required multimodal cues to navigate  doorways and obstacles. Pt navigated hurdles of various heights with overall minA and pt not knocking any of them over (demonstrative cue provided). Pt ambulated back to room without AD and with minA and WC follow for safety. Overall pt seemingly able to follow one step commands and responds better with visual cues  Patient supine in bed at end of session with brakes locked, wife present, bed alarm set, and all needs within reach.  SESSION 2 Skilled Therapeutic Interventions/Progress Updates: Patient semi-reclined in bed on entrance to room with wife and interpretor present. Patient alert and agreeable to PT session.   Patient reported unrated HA pain (manual therapy provided). PTA and pt and pt wife discussed pt's d/c plan back home with pt wife confirming personal matter of housing situation. Pt performed bed mobility with supervision and HOB elevated. Pt sat EOB and cued to donn personal shoes with pt doing so and even correcting when about to donn wrong shoe to wrong foot (pt wife informed to allow pt to self correct errors vs assisting). Pt performed stand step transfer without AD and with CGA. Pt ambulated to toilet in RW with cues for pt to safely manage RW (difficult for pt to correct in the moment due to language barrier and global aphasia). Pt had BM and cued to perform self posterior peri-care with pt doing so and PTA following up for hygiene (pt used R UE while sitting) while pt  standing with R UE supported on railing and CGA. Pt ambulated to sink with minA to safely manage RW with pt eventually cued to release support and to ambulate short distance to sink. Pt washed hands and required pointed cues to where soap, handle of sink and wash clothes were located). Pt washed and dried hands/forearms with supervision.  Manual Therapy: Palpation of cervical paraspinals performed with tension noted. Education and rationale provided with pt agreeing to participate in intervention. - Soft tissue  mobilization provided to stated area following 5 min donning of hot pack to relax musculature. Pt wife educated on how to perform massage to area for her to perform in room to decrease pt's tightness and improve pt's cervical ROM as pt also presented with grimace face at beginning of session and would periodically attempt to stretch neck).   Patient supine in bed at end of session with brakes locked, wife present, bed alarm set, and all needs within reach.       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

## 2024-08-16 NOTE — Progress Notes (Signed)
 Have secured message Lavanda Dotter navigator to let know of discharge date 1/23 and if his OP-HD is T, [facility] Sat or like here M, W, F. Await response and if needed can change date. Lavanda responded he is a M, W, F OP-HD so will message team to see if Thursday 1/22 or Sat 1/24 will work.

## 2024-08-17 LAB — RENAL FUNCTION PANEL
Albumin: 3.6 g/dL (ref 3.5–5.0)
Anion gap: 13 (ref 5–15)
BUN: 28 mg/dL — ABNORMAL HIGH (ref 6–20)
CO2: 28 mmol/L (ref 22–32)
Calcium: 7.7 mg/dL — ABNORMAL LOW (ref 8.9–10.3)
Chloride: 91 mmol/L — ABNORMAL LOW (ref 98–111)
Creatinine, Ser: 7.2 mg/dL — ABNORMAL HIGH (ref 0.61–1.24)
GFR, Estimated: 8 mL/min — ABNORMAL LOW
Glucose, Bld: 114 mg/dL — ABNORMAL HIGH (ref 70–99)
Phosphorus: 5.4 mg/dL — ABNORMAL HIGH (ref 2.5–4.6)
Potassium: 4.5 mmol/L (ref 3.5–5.1)
Sodium: 131 mmol/L — ABNORMAL LOW (ref 135–145)

## 2024-08-17 LAB — CBC WITH DIFFERENTIAL/PLATELET
Abs Immature Granulocytes: 0.05 K/uL (ref 0.00–0.07)
Basophils Absolute: 0.1 K/uL (ref 0.0–0.1)
Basophils Relative: 1 %
Eosinophils Absolute: 0.7 K/uL — ABNORMAL HIGH (ref 0.0–0.5)
Eosinophils Relative: 7 %
HCT: 33.8 % — ABNORMAL LOW (ref 39.0–52.0)
Hemoglobin: 11.1 g/dL — ABNORMAL LOW (ref 13.0–17.0)
Immature Granulocytes: 1 %
Lymphocytes Relative: 26 %
Lymphs Abs: 2.6 K/uL (ref 0.7–4.0)
MCH: 31.4 pg (ref 26.0–34.0)
MCHC: 32.8 g/dL (ref 30.0–36.0)
MCV: 95.5 fL (ref 80.0–100.0)
Monocytes Absolute: 1.1 K/uL — ABNORMAL HIGH (ref 0.1–1.0)
Monocytes Relative: 12 %
Neutro Abs: 5.2 K/uL (ref 1.7–7.7)
Neutrophils Relative %: 53 %
Platelets: 196 K/uL (ref 150–400)
RBC: 3.54 MIL/uL — ABNORMAL LOW (ref 4.22–5.81)
RDW: 12.7 % (ref 11.5–15.5)
WBC: 9.8 K/uL (ref 4.0–10.5)
nRBC: 0 % (ref 0.0–0.2)

## 2024-08-17 LAB — GLUCOSE, CAPILLARY
Glucose-Capillary: 124 mg/dL — ABNORMAL HIGH (ref 70–99)
Glucose-Capillary: 176 mg/dL — ABNORMAL HIGH (ref 70–99)
Glucose-Capillary: 184 mg/dL — ABNORMAL HIGH (ref 70–99)
Glucose-Capillary: 186 mg/dL — ABNORMAL HIGH (ref 70–99)

## 2024-08-17 LAB — MAGNESIUM: Magnesium: 2.4 mg/dL (ref 1.7–2.4)

## 2024-08-17 NOTE — Progress Notes (Signed)
 Occupational Therapy Session Note  Patient Details  Name: Blake Santana MRN: [id number] Date of Birth: 1971-01-17  Today's Date: 08/17/2024 OT Individual Time: 8693-8597 OT Individual Time Calculation (min): 56 min    Short Term Goals: Week 1:  OT Short Term Goal 1 (Week 1): STGs = LTGs  Skilled Therapeutic Interventions/Progress Updates:  Pt greeted supine in bed, pt agreeable to OT intervention.   Pts wife and interpreter present during session.    Transfers/bed mobility/functional mobility:  Pt completed supine>sit with CGA. Pt completed sit>stands with CGA and stand pivot transfers with RW and MINA to manage RW.   Therapeutic activity:  Pt completed standing RUE coordination task with pt instructed to reach to basketball net to remove clothespins. Pt required max multimodal cues to sequence task d/t apraxia but with increased time and effort to able to complete task with CGA.   Graded task up and instructed pt to place/remove horseshoes from rim of basketball goal to challenge cylindrical grasp and to simulate reaching in kitchen. Pt completed this task with more ease and CGA.  Pt completed standing bimanual task with pt instructed to select indicated fruits/veggies from R side of sink and then wash items and place in bowl. Pt completed task with 80% accuracy when choosing the correct fruit/veggie. Pt unable to state any fruit/veggie correctly.   ADLs:  Transfers: discussed plan for shower transfers at home with wife reporting tub shower. Demonstrated use of TTB with pt able to return demo ambulatory transfer with RW and CGA, MOD multimodal cues for technique and RW mgmt.   Of note wife reports adjustable bed at home, therefore deferred practicing transferring on to flat head of bed.                   Ended session with pt supine in bed with all needs within reach and bed alarm activated.                    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

## 2024-08-17 NOTE — Progress Notes (Signed)
 "                                                        PROGRESS NOTE   Subjective/Complaints:  Ipad interpreter used then live interpreter entered room ,wife at bedside Severe global aphasia Discussed d/c date an possible need for adjustment based on HD schedule  Discussed aphasia recovery  Labs reviewed   ROS- cannot obtain  Objective:   No results found. Recent Labs    08/17/24 0527  WBC 9.8  HGB 11.1*  HCT 33.8*  PLT 196   Recent Labs    08/17/24 0527  NA 131*  K 4.5  CL 91*  CO2 28  GLUCOSE 114*  BUN 28*  CREATININE 7.20*  CALCIUM  7.7*    Intake/Output Summary (Last 24 hours) at 08/17/2024 0748 Last data filed at 08/17/2024 0656 Gross per 24 hour  Intake 330 ml  Output 1500 ml  Net -1170 ml        Physical Exam: Vital Signs Blood pressure 139/66, pulse 63, temperature 97.8 F (36.6 C), temperature source Oral, resp. rate 18, height 5' 2 (1.575 m), weight 67.1 kg, SpO2 93%.   General: No acute distress Mood and affect are appropriate Heart: Regular rate and rhythm no rubs murmurs or extra sounds Lungs: Clear to auscultation, breathing unlabored, no rales or wheezes Abdomen: Positive bowel sounds, soft nontender to palpation, nondistended Extremities: No clubbing, cyanosis, or edema Skin: No evidence of breakdown, no evidence of rash Neurologic: Cranial nerves II through XII intact, motor strength is difficult to assess to to comprehension deficit, responds to gestural cues is antigravity in all 4 limbs but stronger on Left than on the right  Sensory exam withdrws to pinch in BUE but formal testing limited by aphasia  No words attempted , cannot follow verbal commands even with interpreter assist  Needs gestural cues  Cerebellar exam cannot cooperate with exam  Musculoskeletal:no pain with  range of motion in all 4 extremities. No joint swelling   Assessment/Plan: 1. Functional deficits which require 3+ hours per day of interdisciplinary therapy  in a comprehensive inpatient rehab setting. Physiatrist is providing close team supervision and 24 hour management of active medical problems listed below. Physiatrist and rehab team continue to assess barriers to discharge/monitor patient progress toward functional and medical goals  Care Tool:  Bathing    Body parts bathed by patient: Chest, Abdomen, Front perineal area, Right upper leg, Left upper leg, Right lower leg, Left lower leg, Face, Right arm, Buttocks   Body parts bathed by helper: Left arm     Bathing assist Assist Level: Minimal Assistance - Patient > 75%     Upper Body Dressing/Undressing Upper body dressing   What is the patient wearing?: Pull over shirt    Upper body assist Assist Level: Supervision/Verbal cueing    Lower Body Dressing/Undressing Lower body dressing      What is the patient wearing?: Underwear/pull up, Pants     Lower body assist Assist for lower body dressing: Contact Guard/Touching assist     Toileting Toileting    Toileting assist Assist for toileting: Moderate Assistance - Patient 50 - 74%     Transfers Chair/bed transfer  Transfers assist     Chair/bed transfer assist level: Minimal Assistance - Patient > 75%  Locomotion Ambulation   Ambulation assist      Assist level: Minimal Assistance - Patient > 75% Assistive device: No Device Max distance: 130ft   Walk 10 feet activity   Assist     Assist level: Minimal Assistance - Patient > 75% Assistive device: No Device   Walk 50 feet activity   Assist    Assist level: Minimal Assistance - Patient > 75% Assistive device: No Device    Walk 150 feet activity   Assist Walk 150 feet activity did not occur: Safety/medical concerns (fatigue, weakness)         Walk 10 feet on uneven surface  activity   Assist Walk 10 feet on uneven surfaces activity did not occur: Safety/medical concerns (fatigue, weakness)         Wheelchair     Assist Is  the patient using a wheelchair?: Yes Type of Wheelchair: Manual    Wheelchair assist level: Dependent - Patient 0% Max wheelchair distance: >369ft    Wheelchair 50 feet with 2 turns activity    Assist        Assist Level: Dependent - Patient 0%   Wheelchair 150 feet activity     Assist      Assist Level: Dependent - Patient 0%   Blood pressure 139/66, pulse 63, temperature 97.8 F (36.6 C), temperature source Oral, resp. rate 18, height 5' 2 (1.575 m), weight 67.1 kg, SpO2 93%.  Medical Problem List and Plan: 1. Functional deficits secondary to Left MCA infarct             -patient may  shower             -ELOS/Goals: 1/22 vs 1/24 , Sup-MinA goals, evals today ,  Due to lack of insurance will not have therapy post d/c, perhaps pt may be able to go to [facility] program   2.  Antithrombotics: -DVT/anticoagulation:  Mechanical: Sequential compression devices, below knee Bilateral lower extremities Pharmaceutical: Heparin              -antiplatelet therapy: Aspirin  and Plavix  for 3 months, then followed by aspirin  alone.   3. Pain Management: Tylenol  prn    4. Mood/Behavior/Sleep: LCSW to follow for evaluation and support when available.              -antipsychotic agents: n/a             -sleep: Trazadone prn    5. Neuropsych/cognition: This patient is not capable of making decisions on his  own behalf.   6. Skin/Wound Care: Routine pressure relief measures.    7. Fluids/Electrolytes/Nutrition: Monitor I&O and weight. Follow up labs monitored by HD.    8.  Acute CVA: Continue DAPT aspirin  and Plavix  for 3 months, then followed by aspirin  alone.  Continue Crestor  and Zetia .  Follow-up outpatient with cardiology for heart monitor.   9.  ESRD: On HD TTS, nephrology following.   10.  Chronic HFpEF: Continue fluid removal per HD.    11.  Type 2 diabetes: A1c 8.5, poorly controlled.  Used glipizide  10 mg at home. -Monitor glucose ACHS with SSI NovoLog . CBG  (last 3)  Recent Labs    08/16/24 1802 08/16/24 2144 08/17/24 0648  GLUCAP 118* 227* 124*  Am CBG in desired range     12.  Anemia of chronic disease: Hemoglobin 10.7.  Monitor CBC.    13.  Hyperlipidemia/CAD: Continue statin, zetia , aspirin  and plavix .    14.  Dental pain: Received IV  Unasyn  transition Continue Augmentin  x 4 days f/u dental as OP           LOS: 3 days A FACE TO FACE EVALUATION WAS PERFORMED  Blake Santana 08/17/2024, 7:48 AM     "

## 2024-08-17 NOTE — Progress Notes (Addendum)
 Patient ID: Blake Santana, male   DOB: [birth year], 54 y.o.   MRN: [id number]  New discharge date 1/24 due to HD conflict. Will let pt and wife know once have access to interpretor.  2;31 PM have met with pt and girlfriend and interpreter to give team conference update regarding goals of CGA level and target discharge date now of 1/24. Pt doing better today and feeling better. Have asked them to begin looking for any DME might need and will give pro bono clinic information

## 2024-08-17 NOTE — Progress Notes (Signed)
 Physical Therapy Session Note  Patient Details  Name: Blake Santana MRN: [id number] Date of Birth: 08-07-1970  Today's Date: 08/17/2024 PT Individual Time: 8964-8884 PT Individual Time Calculation (min): 40 min   Short Term Goals: Week 1:  PT Short Term Goal 1 (Week 1): STG=LTG due to LOS  Skilled Therapeutic Interventions/Progress Updates: Patient supine in bed on entrance to room. Patient alert and agreeable to PT session.   Patient reported mild HA but that it has improved since yesterday following manual therapy  Therapeutic Activity: Bed Mobility: Pt performed supine<>sit on EOB with supervision.  Transfers: Pt performed sit<>stand transfers throughout session with CGA in preparation for functional mobility.  Neuromuscular Re-ed: NMR facilitated during session with focus on coordination, attention to task, R UE NMRE. - Pt instructed to stack cups (small base to small base) on table with pt requiring increased time/effort to perform with R UE and pt placing cups on horizontal position vs vertically stacked. Pt then cued to only flip cups upside down and required max multimodal cuing to do and eventually able to do so when cued to flip cups with L UE first, then progressed to using R UE (presented with increase in apraxia).  - Pt progressed after mass practice of flipping cups back and forth to stacking and was able to do so after increased time/effort with R UE but was ultimately able to improve coordination of R UE (pt also cued to take down cones after stacking them all).  - Pt ambulated from room<>main gym using RW with minA to navigate safely the hallways with pt unable to recall where room was at end of session until close to room with pt looking in and seeing familiar items.   NMR performed for improvements in motor control and coordination, balance, sequencing, judgement, and self confidence/ efficacy in performing all aspects of mobility at highest level of  independence.   Patient supine in bed at end of session with brakes locked, wife present, bed alarm set, and all needs within reach.      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

## 2024-08-17 NOTE — Progress Notes (Signed)
 Occupational Therapy Session Note  Patient Details  Name: Blake Santana MRN: [id number] Date of Birth: 1971/01/11  Today's Date: 08/17/2024 OT Individual Time: 9084-9041 OT Individual Time Calculation (min): 43 min    Short Term Goals: Week 1:  OT Short Term Goal 1 (Week 1): STGs = LTGs  Skilled Therapeutic Interventions/Progress Updates:    Pt received in bed with wife and interpretor present. Explained to wife that starting next Monday we will have her do family education so she can be more involved with ambulating him to the bathroom and assisting with self care. She left to get breakfast.   Pt sat to EOB with S and donned high top sneakers with out A! Pt engaged in balance and RUE strengthening exercises. He does best with tactile and close visual cues as following verbal commands for new movements are challenging for him.   3 lb dowel bar for shoulder AROM with overhead reaches in sitting and standing.   Sit to stands 10x and then another 10x holding weighted dowel  bar so  he could only use his legs with supervision.  Holding ball in seated for cross chops. Pt did well moving ball from R knee to L shoulder but needed A and cues for full motion L knee to R shoulder.  Repeated exercise in standing with no LOB.   Overall, pt much more energetic and did well completing several challenging dynamic sit to stands and standing balance.   Pt opted to sit in wc. Supervision to take a few steps bed to wc.  Belt alarm on and all needs met. Wife returned to room at end of session.   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 Restrictions Weight Bearing Restrictions Per Provider Order: No    Vital Signs: Therapy Vitals Temp: 97.8 F (36.6 C) Temp Source: Oral Pulse Rate: 63 Resp: 18 BP: 139/66 Patient Position (if  appropriate): Lying Oxygen Therapy SpO2: 93 % O2 Device: Room Air Pain: Pain Assessment Pain Scale: 0-10 Pain Score: 3  Pain Location: Head Pain Intervention(s): Medication (See eMAR) ADL: ADL Eating: Supervision/safety Grooming: Minimal assistance Upper Body Bathing: Minimal assistance Where Assessed-Upper Body Bathing: Shower Lower Body Bathing: Contact guard Where Assessed-Lower Body Bathing: Shower Upper Body Dressing: Supervision/safety Where Assessed-Upper Body Dressing: Chair Lower Body Dressing: Contact guard Where Assessed-Lower Body Dressing: Chair Toileting: Moderate assistance Where Assessed-Toileting: Teacher, Adult Education: Moderate assistance Toilet Transfer Method: Proofreader: Acupuncturist: Moderate assistance Film/video Editor Method: Designer, Industrial/product: Grab bars  Therapy/Group: Individual Therapy  Lupe Bonner 08/17/2024, 9:43 AM

## 2024-08-17 NOTE — Progress Notes (Signed)
 " [facility] Progress Note   Subjective:    Seen and examined patient at bedside. Patient's family member, PT, and [facility]'s interpreter also at bedside. No complaints, tolerated HD yesterday with net uf 1.5L  Objective Vitals:   08/16/24 1842 08/16/24 2146 08/17/24 0500 08/17/24 0649  BP: (!) 159/76 (!) 149/63  139/66  Pulse: 67 69  63  Resp:  17  18  Temp:  98.3 F (36.8 C)  97.8 F (36.6 C)  TempSrc:  Oral  Oral  SpO2:  94%  93%  Weight:   67.1 kg   Height:       Physical Exam General: Awake, alert, mildly frail, NAD Heart: S1 and S2; No murmurs, gallops, or rubs Lungs: Clear throughout Abdomen: Soft and non-tender Extremities: No LE edema Dialysis Access: AVF   Filed Weights   08/16/24 1323 08/16/24 1642 08/17/24 0500  Weight: 68.8 kg 66.7 kg 67.1 kg    Intake/Output Summary (Last 24 hours) at 08/17/2024 0920 Last data filed at 08/17/2024 0839 Gross per 24 hour  Intake 450 ml  Output 1500 ml  Net -1050 ml    Additional Objective Labs: Basic Metabolic Panel: Recent Labs  Lab 08/13/24 0136 08/14/24 0239 08/17/24 0527  NA 134* 135 131*  K 4.5 4.9 4.5  CL 92* 93* 91*  CO2 28 26 28   GLUCOSE 106* 134* 114*  BUN 35* 50* 28*  CREATININE 8.65* 10.30* 7.20*  CALCIUM  7.4* 7.1* 7.7*  PHOS  --   --  5.4*   Liver Function Tests: Recent Labs  Lab 08/17/24 0527  ALBUMIN 3.6   No results for input(s): LIPASE, AMYLASE in the last 168 hours. CBC: Recent Labs  Lab 08/14/24 0239 08/17/24 0527  WBC 7.6 9.8  NEUTROABS 4.2 5.2  HGB 10.7* 11.1*  HCT 32.6* 33.8*  MCV 95.0 95.5  PLT 146* 196   Blood Culture    Component Value Date/Time   SDES BLOOD RIGHT FOREARM 03/29/2020 2250   SPECREQUEST  03/29/2020 2250    BOTTLES DRAWN AEROBIC AND ANAEROBIC Blood Culture adequate volume   CULT  03/29/2020 2250    NO GROWTH 5 DAYS Performed at [facility], [address], [place], [place] [address]    REPTSTATUS 04/03/2020 FINAL 03/29/2020  2250    Cardiac Enzymes: No results for input(s): CKTOTAL, CKMB, CKMBINDEX, TROPONINI in the last 168 hours. CBG: Recent Labs  Lab 08/16/24 0551 08/16/24 1124 08/16/24 1802 08/16/24 2144 08/17/24 0648  GLUCAP 103* 133* 118* 227* 124*   Iron  Studies: No results for input(s): IRON , TIBC, TRANSFERRIN, FERRITIN in the last 72 hours. Lab Results  Component Value Date   INR 1.0 08/06/2024   INR 1.1 11/17/2021   INR 1.26 09/02/2018   Studies/Results: No results found.  Medications:   amLODipine   10 mg Oral Daily   carvedilol   12.5 mg Oral BID WC   Chlorhexidine  Gluconate Cloth  6 each Topical Q0600   Chlorhexidine  Gluconate Cloth  6 each Topical Q0600   clopidogrel   75 mg Oral Daily   ezetimibe   10 mg Oral Daily   heparin   5,000 Units Subcutaneous Q8H   insulin  aspart  0-5 Units Subcutaneous QHS   insulin  aspart  0-6 Units Subcutaneous TID WC   rosuvastatin   10 mg Oral Daily    Dialysis Orders: [facility] 3h B350 65.2kg  AVF  Hep 2000  Last OP HD 1/02, post wt 67.2kg Hep B SAg neg 07/10/24 Heb B SAb +Immune 05/15/24  Assessment/Plan: ESRD - on HD MWF, tomorrow   Acute CVA  - L posterior MCA territory per MRI - aphasia - had CVA extension  -per primary, in CIR   HTN/Volume - on norvasc  (coreg  dc'd)  - per neuro -> try to maintain SBP 130-150 given L M2 occlusion -will UF as tolerated   Anemia of esrd - Hb 11.1, follow, no esa needs yet   Secondary hyperparathyroidism -po4 acceptable   Dispo: in CIR  Ephriam Stank, MD [facility] 08/17/2024,9:20 AM  LOS: 3 days    "

## 2024-08-17 NOTE — Progress Notes (Signed)
 Speech Language Pathology Daily Session Note  Patient Details  Name: Blake Santana MRN: [id number] Date of Birth: February 03, 1971  Today's Date: 08/17/2024 SLP Individual Time: 9199-9144 SLP Individual Time Calculation (min): 55 min  Short Term Goals: Week 1: SLP Short Term Goal 1 (Week 1): STG=LTG due to ELOS  Skilled Therapeutic Interventions: Skilled therapy session focused on dysphagia and communication goals. Upon entrance, patient consuming D2/thin liquid breakfast tray with assistance from wife. Patient with timely mastication and complete oral clearance. Due to success with D2 solids, SLP offered trials of D3. Patient with continued timely mastication though mild oral residuals. Residuals cleared with liquid wash. Patient with x1 instance of coughing after sips of thin liquids, though no other instances of overt s/sx of aspiration. Continue D3 trials and consider trial tray of upgraded textures. SLP targeted communication through automatic speech tasks. Patient verbalized name with modA and numbers 1-4 with maxA. SLP provided total A for patient to answer biographical yes/no questions with use of gestures and simple colored AAC board. Lastly, SLP prompted ID and verbalization of functional objects. Patient required total A. Patient with consistent neologisms and perseverations this date. Patient left in bed with alarm set and call bell in reach. Continue POC.   Pain None reported   Therapy/Group: Individual Therapy  Jaleal Schliep M.A., CCC-SLP 08/17/2024, 7:42 AM

## 2024-08-17 NOTE — Progress Notes (Signed)
 Noted that d/c date for this pt is set as 1/24 from CIR. Pt should resume with his clinic on 1/26. Contacted [facility] clinic at this time and informed of this. Will continue to assist as needed.   Aqsa Sensabaugh Dialysis Navigator [phone]

## 2024-08-18 LAB — CBC
HCT: 34.1 % — ABNORMAL LOW (ref 39.0–52.0)
Hemoglobin: 11.5 g/dL — ABNORMAL LOW (ref 13.0–17.0)
MCH: 31.2 pg (ref 26.0–34.0)
MCHC: 33.7 g/dL (ref 30.0–36.0)
MCV: 92.4 fL (ref 80.0–100.0)
Platelets: 198 K/uL (ref 150–400)
RBC: 3.69 MIL/uL — ABNORMAL LOW (ref 4.22–5.81)
RDW: 12.9 % (ref 11.5–15.5)
WBC: 8 K/uL (ref 4.0–10.5)
nRBC: 0 % (ref 0.0–0.2)

## 2024-08-18 LAB — GLUCOSE, CAPILLARY
Glucose-Capillary: 120 mg/dL — ABNORMAL HIGH (ref 70–99)
Glucose-Capillary: 146 mg/dL — ABNORMAL HIGH (ref 70–99)
Glucose-Capillary: 195 mg/dL — ABNORMAL HIGH (ref 70–99)
Glucose-Capillary: 107 mg/dL — ABNORMAL HIGH (ref 70–99)
Glucose-Capillary: 247 mg/dL — ABNORMAL HIGH (ref 70–99)

## 2024-08-18 LAB — RENAL FUNCTION PANEL
Albumin: 3.9 g/dL (ref 3.5–5.0)
Anion gap: 17 — ABNORMAL HIGH (ref 5–15)
BUN: 46 mg/dL — ABNORMAL HIGH (ref 6–20)
CO2: 26 mmol/L (ref 22–32)
Calcium: 7.6 mg/dL — ABNORMAL LOW (ref 8.9–10.3)
Chloride: 86 mmol/L — ABNORMAL LOW (ref 98–111)
Creatinine, Ser: 9.48 mg/dL — ABNORMAL HIGH (ref 0.61–1.24)
GFR, Estimated: 6 mL/min — ABNORMAL LOW
Glucose, Bld: 178 mg/dL — ABNORMAL HIGH (ref 70–99)
Phosphorus: 6.7 mg/dL — ABNORMAL HIGH (ref 2.5–4.6)
Potassium: 4.9 mmol/L (ref 3.5–5.1)
Sodium: 129 mmol/L — ABNORMAL LOW (ref 135–145)

## 2024-08-18 LAB — MAGNESIUM: Magnesium: 2.5 mg/dL — ABNORMAL HIGH (ref 1.7–2.4)

## 2024-08-18 MED ORDER — HEPARIN SODIUM (PORCINE) 1000 UNIT/ML DIALYSIS: 1000.0000 [IU] | INTRAMUSCULAR | Status: DC | PRN

## 2024-08-18 MED ORDER — PENTAFLUOROPROP-TETRAFLUOROETH EX AERO: 1.0000 | INHALATION_SPRAY | CUTANEOUS | Status: DC | PRN

## 2024-08-18 MED ORDER — HEPARIN SODIUM (PORCINE) 1000 UNIT/ML IJ SOLN
INTRAMUSCULAR | Status: AC
  Filled 2024-08-18: qty 5

## 2024-08-18 MED ORDER — LIDOCAINE HCL (PF) 1 % IJ SOLN: 5.0000 mL | INTRAMUSCULAR | Status: DC | PRN

## 2024-08-18 MED ORDER — LIDOCAINE-PRILOCAINE 2.5-2.5 % EX CREA: 1.0000 | TOPICAL_CREAM | CUTANEOUS | Status: DC | PRN

## 2024-08-18 MED ORDER — ANTICOAGULANT SODIUM CITRATE 4% (200MG/5ML) IV SOLN: 5.0000 mL | Status: DC | PRN

## 2024-08-18 MED ORDER — GLIPIZIDE 5 MG PO TABS
2.5000 mg | ORAL_TABLET | Freq: Every day | ORAL | Status: DC
  Administered 2024-08-19 – 2024-08-26 (×8): 2.5 mg via ORAL
  Filled 2024-08-18 (×8): qty 0.5

## 2024-08-18 MED ORDER — ALTEPLASE 2 MG IJ SOLR: 2.0000 mg | Freq: Once | INTRAMUSCULAR | Status: DC | PRN

## 2024-08-18 NOTE — Progress Notes (Signed)
 Physical Therapy Session Note  Patient Details  Name: Blake Santana MRN: [id number] Date of Birth: [birth year]  Today's Date: 08/18/2024 PT Individual Time: 9093-8997; 1121 - 1202 PT Individual Time Calculation (min): 56 min; 41 min   Short Term Goals: Week 1:  PT Short Term Goal 1 (Week 1): STG=LTG due to LOS  SESSION 1 Skilled Therapeutic Interventions/Progress Updates: Patient semi-reclined in bed with wife and interpretor present on entrance to room. Patient alert and agreeable to PT session.   Patient reported unrated pain/increase in blurriness in L eye. Nsg and attending PA notified.  Pt performed all transfers in preparation for functional mobility, as well as bed mobility with supervision and no AD. Pt ambulated from room<>main gym without AD and CGA with few moments of minA for standing balance due to gait deviations to the R and decreased safety awareness. PTA discussed with pt and pt wife for pt wife to bring pictures of family to pt for personalization to interventions.  TUG (avg = 15.51s) with light CGA then close supervision with pt demonstrating recall of new information by following sequence of walking past cones (2 cone barrier), turning and then sitting back down without cues.   - 15.97s  - 15.35s  - 15.21s  Neuromuscular Re-ed: NMR facilitated during session with focus on dynamic standing balance, memory recall, visual tracking, environmental awareness. - Pt cued to doff grip socks but to keep personal socks on. Pt demonstrated ability to problem solve and to separate socks to keep personal on, and when accidentally doffing both grip and personal socks, pt donned personal back on. Pt donned personal shoes as well but required assistance with untying/tying R shoe lace (pt able to slide on shoe on L).  - Pt ambulated around main gym hallway with cue to locate orange cones first, then black cups after finding and stacking all orange cones. Cones stacked on railings  on walls to further challenge visual tracking. Pt required max cuing at first to locate cones as pt would walk past or seemingly look in direction of cone but not picking it up (pt with visual deficits). Pt eventually required assistance to locate first cone and then after finding 2nd to stack on top. Pt then required min cuing to continue to stack cones after finding. Pt located orange cones first and required max cuing to locate next cones. Pt stacked cups with min cue at first, then pt able to do so without cuing. Pt required light CGA for safety. - Pt cued to walk across main gym to basketball net to doff pins off net of yellow and red and to ambulate back to other side of room to table and to place on matching color cone. Pt required max cuing throughout to follow sequence and fluctuated between mod/maxA to recall demonstrative cues.   NMR performed for improvements in motor control and coordination, balance, sequencing, judgement, and self confidence/ efficacy in performing all aspects of mobility at highest level of independence.   Patient supine in bed at end of session with brakes locked, wife present, bed alarm set, and all needs within reach.  SESSION 2 Skilled Therapeutic Interventions/Progress Updates: Patient semi-reclined in bed with wife and interpretor present on entrance to room. Patient alert and agreeable to PT session.   Patient reported increase in pain in L eye, especially with bright lights. Pt also continued to report blurry vision. Nsg and attending physician notified. Pt still agreeable to participate in therapy. Pt ambulated from room<>main gym with  CGA/light minA and pt recalling where room was when ambulating back at end of session. Pt cued to stack cups small base to small base from one side of table to the other all starting right side up. Pt required significantly less time performing this task vs previous day with this PTA after initial cuing of sequence. Pt then cued to  put cups back right side up on other side of table and pt di so initial time required to recall sequence without assistance. Intervention focused on pt's ability to follow simple commands of stacking blocks in same order/color that PTA made (simple stacked shapes with at most 4 different color). Pt required increased time/effort/demonstration to obtain shape, which became easier after pt achieve said shape with less time required.  Patient supine in bed at end of session with brakes locked, wife present, belt alarm set, and all needs within reach.       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

## 2024-08-18 NOTE — Progress Notes (Signed)
" °   08/18/24 1740  Vitals  Temp (!) 97.4 F (36.3 C)  Pulse Rate 65  Resp 16  BP 137/69  SpO2 96 %  O2 Device Room Air  Weight 69.5 kg  Type of Weight Post-Dialysis  Oxygen Therapy  Patient Activity (if Appropriate) In bed  Pulse Oximetry Type Continuous  Oximetry Probe Site Changed No  Post Treatment  Dialyzer Clearance Lightly streaked  Hemodialysis Intake (mL) 0 mL  Liters Processed 69.7  Fluid Removed (mL) 2000 mL  Tolerated HD Treatment Yes  AVG/AVF Arterial Site Held (minutes) 10 minutes  AVG/AVF Venous Site Held (minutes) 10 minutes   Received patient in bed to unit.  Alert and oriented.  Informed consent signed and in chart.   TX duration:3.0  Patient tolerated well.  Transported back to the room  Alert, without acute distress.  Hand-off given to patient's nurse.   Access used: LUAF Access issues: no complications  Total UF removed: 2000 Medication(s) given: none   Blake Santana [facility] "

## 2024-08-18 NOTE — Progress Notes (Addendum)
 "                                                        PROGRESS NOTE   Subjective/Complaints:  Labs reviewed remains severely aphasic minimally responsive to verbal cues from Spanish interpreter   ROS- cannot obtain  Objective:   No results found. Recent Labs    08/17/24 0527  WBC 9.8  HGB 11.1*  HCT 33.8*  PLT 196   Recent Labs    08/17/24 0527  NA 131*  K 4.5  CL 91*  CO2 28  GLUCOSE 114*  BUN 28*  CREATININE 7.20*  CALCIUM  7.7*    Intake/Output Summary (Last 24 hours) at 08/18/2024 0747 Last data filed at 08/17/2024 1856 Gross per 24 hour  Intake 480 ml  Output --  Net 480 ml        Physical Exam: Vital Signs Blood pressure 136/69, pulse (!) 59, temperature 98.2 F (36.8 C), temperature source Oral, resp. rate 18, height 5' 2 (1.575 m), weight 67.1 kg, SpO2 97%.   General: No acute distress Mood and affect are appropriate Heart: Regular rate and rhythm no rubs murmurs or extra sounds Lungs: Clear to auscultation, breathing unlabored, no rales or wheezes Abdomen: Positive bowel sounds, soft nontender to palpation, nondistended Extremities: No clubbing, cyanosis, or edema Skin: No evidence of breakdown, no evidence of rash Neurologic: Cranial nerves II through XII intact, motor strength is difficult to assess to to comprehension deficit, responds to gestural cues is antigravity in all 4 limbs but stronger on Left than on the right  Sensory exam withdrws to pinch in BUE but formal testing limited by aphasia  No words attempted , cannot follow verbal commands even with interpreter assist  Needs gestural cues  Cerebellar exam cannot cooperate with exam  Musculoskeletal:no pain with  range of motion in all 4 extremities. No joint swelling   Assessment/Plan: 1. Functional deficits which require 3+ hours per day of interdisciplinary therapy in a comprehensive inpatient rehab setting. Physiatrist is providing close team supervision and 24 hour management  of active medical problems listed below. Physiatrist and rehab team continue to assess barriers to discharge/monitor patient progress toward functional and medical goals  Care Tool:  Bathing    Body parts bathed by patient: Chest, Abdomen, Front perineal area, Right upper leg, Left upper leg, Right lower leg, Left lower leg, Face, Right arm, Buttocks   Body parts bathed by helper: Left arm     Bathing assist Assist Level: Minimal Assistance - Patient > 75%     Upper Body Dressing/Undressing Upper body dressing   What is the patient wearing?: Pull over shirt    Upper body assist Assist Level: Supervision/Verbal cueing    Lower Body Dressing/Undressing Lower body dressing      What is the patient wearing?: Underwear/pull up, Pants     Lower body assist Assist for lower body dressing: Contact Guard/Touching assist     Toileting Toileting    Toileting assist Assist for toileting: Moderate Assistance - Patient 50 - 74%     Transfers Chair/bed transfer  Transfers assist     Chair/bed transfer assist level: Minimal Assistance - Patient > 75%     Locomotion Ambulation   Ambulation assist      Assist level: Minimal Assistance - Patient > 75%  Assistive device: No Device Max distance: 165ft   Walk 10 feet activity   Assist     Assist level: Minimal Assistance - Patient > 75% Assistive device: No Device   Walk 50 feet activity   Assist    Assist level: Minimal Assistance - Patient > 75% Assistive device: No Device    Walk 150 feet activity   Assist Walk 150 feet activity did not occur: Safety/medical concerns (fatigue, weakness)         Walk 10 feet on uneven surface  activity   Assist Walk 10 feet on uneven surfaces activity did not occur: Safety/medical concerns (fatigue, weakness)         Wheelchair     Assist Is the patient using a wheelchair?: Yes Type of Wheelchair: Manual    Wheelchair assist level: Dependent - Patient  0% Max wheelchair distance: >367ft    Wheelchair 50 feet with 2 turns activity    Assist        Assist Level: Dependent - Patient 0%   Wheelchair 150 feet activity     Assist      Assist Level: Dependent - Patient 0%   Blood pressure 136/69, pulse (!) 59, temperature 98.2 F (36.8 C), temperature source Oral, resp. rate 18, height 5' 2 (1.575 m), weight 67.1 kg, SpO2 97%.  Medical Problem List and Plan: 1. Functional deficits secondary to Left MCA infarct             -patient may  shower             -ELOS/Goals: 1/22 vs 1/24 , Sup-MinA goals, evals today ,  Due to lack of insurance will not have therapy post d/c, perhaps pt may be able to go to [facility] aphasia program   2.  Antithrombotics: -DVT/anticoagulation:  Mechanical: Sequential compression devices, below knee Bilateral lower extremities Pharmaceutical: Heparin              -antiplatelet therapy: Aspirin  and Plavix  for 3 months, then followed by aspirin  alone.   3. Pain Management: Tylenol  prn    4. Mood/Behavior/Sleep: LCSW to follow for evaluation and support when available.              -antipsychotic agents: n/a             -sleep: Trazadone prn    5. Neuropsych/cognition: This patient is not capable of making decisions on his  own behalf.   6. Skin/Wound Care: Routine pressure relief measures.    7. Fluids/Electrolytes/Nutrition: Monitor I&O and weight. Follow up labs monitored by HD.    8.  Acute CVA: Continue DAPT aspirin  and Plavix  for 3 months, then followed by aspirin  alone.  Continue Crestor  and Zetia .  Follow-up outpatient with cardiology for heart monitor.   9.  ESRD: On HD TTS, nephrology following.   10.  Chronic HFpEF: Continue fluid removal per HD.    11.  Type 2 diabetes: A1c 8.5, poorly controlled.  Used glipizide  10 mg at home. -Monitor glucose ACHS with SSI NovoLog . CBG (last 3)  Recent Labs    08/17/24 1628 08/17/24 2120 08/18/24 0610  GLUCAP 184* 186* 120*  Am CBG in  desired range Start low dose glipizide  to control pm CBGs    12.  Anemia of chronic disease: Hemoglobin 10.7.  Monitor CBC.    13.  Hyperlipidemia/CAD: Continue statin, zetia , aspirin  and plavix .    14.  Dental pain: Received IV Unasyn  transition Completed Augmentin  x 4 days f/u dental as OP  LOS: 4 days A FACE TO FACE EVALUATION WAS PERFORMED  Blake Santana 08/18/2024, 7:47 AM     "

## 2024-08-18 NOTE — Progress Notes (Signed)
 Speech Language Pathology Daily Session Note  Patient Details  Name: Blake Santana MRN: [id number] Date of Birth: 1971-02-23  Today's Date: 08/18/2024 SLP Individual Time: 1030-1100 SLP Individual Time Calculation (min): 30 min  Short Term Goals: Week 1: SLP Short Term Goal 1 (Week 1): STG=LTG due to ELOS  Skilled Therapeutic Interventions: Skilled therapy session focused on communication goals. SLP facilitated session by prompting patient to point to yes/no on colored simple communication board. Patient required max-totalA to answer simple biographical and environmental questions utilizing board. Patient with approx 50% accuracy though unsure if due to true comprehension vs guessing. SLP targeted expressive language through automatic speech tasks. Patient required modA to verbalize name and numbers 1-4. SLP educated patients wife on utilizing yes/no communication board and practicing automatic speech tasks in free time. She verbalized understanding. Patient left in bed with alarm set and call bell in reach.   Pain None reported   Therapy/Group: Individual Therapy  Tram Wrenn M.A., CCC-SLP 08/18/2024, 7:42 AM

## 2024-08-18 NOTE — Progress Notes (Signed)
 Occupational Therapy Session Note  Patient Details  Name: Blake Santana MRN: [id number] Date of Birth: 1970-11-04  Today's Date: 08/18/2024 OT Individual Time: 9257-9164 OT Individual Time Calculation (min): 53 min  7 mins missed, pt eating breakfast   Short Term Goals: Week 1:  OT Short Term Goal 1 (Week 1): STGs = LTGs  Skilled Therapeutic Interventions/Progress Updates:  Pt greeted semi reclined in bed eating breakfast with wife, returned 7 mins later with pt agreeable to OT intervention.    Pt c/o HA, nurse provided pain meds. Interpreter present during session.   Transfers/bed mobility/functional mobility:  Pt completed supine>sit with CGA.pt completed sit>stand from EOB with Rw and CGA. Pt completed functional ambulation from room<>gym with RW and MINA for RW mgmt.   Therapeutic activity:  Pt completed various seated thereutic  activities focused on RUE coordination/motor planning and visual scanning to R side. Pt first instructed to match colored rings to colored pegs on board. Pt intially needed max multimodal cues to sequence task but with increased time and visual demonstration pt completed task with 90% accuracy for coloring matching. Pt did state the last syllable of orange.   Pt also Stating here and okay   And also Stated the ending syllables of the of the word orange per interpreter  Pt shown numbers 1-3 with pt instructed to place indicated number of shapes on colors I.e 1 shape on number 1. Pt able to completed task with demonstration cues but then completed task with 100% accuracy. Pt completed same task with number 5,6,7, with 100% accuracy.   Pt instructed to match colored shapes into colored cones with a focus on RUE motor planning and visual scanning, pt with increased difficulty manipulating small triangles needing size of shape to be increased. Suspect impaired sensation in R hand but FMC noted vs GM.   ADLs:   Transfers: pt completed ambulatory  toilet transfer with RW and MINA  for RW mgmt.  Toileting: wife assist with pericare with total A.     Ended session with pt on toilet with wife assisting with pericare.          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

## 2024-08-18 NOTE — Progress Notes (Signed)
 " [facility] Progress Note   Subjective:    Seen and examined patient at bedside. No complaints/acute events Wife at bedside  Objective Vitals:   08/17/24 0649 08/17/24 1508 08/17/24 2022 08/18/24 0608  BP: 139/66 (!) 142/69 138/72 136/69  Pulse: 63 63 60 (!) 59  Resp: 18 18 18 18   Temp: 97.8 F (36.6 C) 97.9 F (36.6 C) 97.8 F (36.6 C) 98.2 F (36.8 C)  TempSrc: Oral Oral Oral Oral  SpO2: 93% 98% 95% 97%  Weight:      Height:       Physical Exam General: Awake, alert, mildly frail, NAD Heart: S1 and S2; No murmurs, gallops, or rubs Lungs: Clear throughout Abdomen: Soft and non-tender Extremities: No LE edema Dialysis Access: AVF   Filed Weights   08/16/24 1323 08/16/24 1642 08/17/24 0500  Weight: 68.8 kg 66.7 kg 67.1 kg    Intake/Output Summary (Last 24 hours) at 08/18/2024 0910 Last data filed at 08/18/2024 0800 Gross per 24 hour  Intake 480 ml  Output --  Net 480 ml    Additional Objective Labs: Basic Metabolic Panel: Recent Labs  Lab 08/13/24 0136 08/14/24 0239 08/17/24 0527  NA 134* 135 131*  K 4.5 4.9 4.5  CL 92* 93* 91*  CO2 28 26 28   GLUCOSE 106* 134* 114*  BUN 35* 50* 28*  CREATININE 8.65* 10.30* 7.20*  CALCIUM  7.4* 7.1* 7.7*  PHOS  --   --  5.4*   Liver Function Tests: Recent Labs  Lab 08/17/24 0527  ALBUMIN 3.6   No results for input(s): LIPASE, AMYLASE in the last 168 hours. CBC: Recent Labs  Lab 08/14/24 0239 08/17/24 0527  WBC 7.6 9.8  NEUTROABS 4.2 5.2  HGB 10.7* 11.1*  HCT 32.6* 33.8*  MCV 95.0 95.5  PLT 146* 196   Blood Culture    Component Value Date/Time   SDES BLOOD RIGHT FOREARM 03/29/2020 2250   SPECREQUEST  03/29/2020 2250    BOTTLES DRAWN AEROBIC AND ANAEROBIC Blood Culture adequate volume   CULT  03/29/2020 2250    NO GROWTH 5 DAYS Performed at [facility], [address], [place], [place] [address]    REPTSTATUS 04/03/2020 FINAL 03/29/2020 2250    Cardiac Enzymes: No  results for input(s): CKTOTAL, CKMB, CKMBINDEX, TROPONINI in the last 168 hours. CBG: Recent Labs  Lab 08/17/24 0648 08/17/24 1128 08/17/24 1628 08/17/24 2120 08/18/24 0610  GLUCAP 124* 176* 184* 186* 120*   Iron  Studies: No results for input(s): IRON , TIBC, TRANSFERRIN, FERRITIN in the last 72 hours. Lab Results  Component Value Date   INR 1.0 08/06/2024   INR 1.1 11/17/2021   INR 1.26 09/02/2018   Studies/Results: No results found.  Medications:   amLODipine   10 mg Oral Daily   carvedilol   12.5 mg Oral BID WC   Chlorhexidine  Gluconate Cloth  6 each Topical Q0600   Chlorhexidine  Gluconate Cloth  6 each Topical Q0600   clopidogrel   75 mg Oral Daily   ezetimibe   10 mg Oral Daily   [START ON 08/19/2024] glipiZIDE   2.5 mg Oral QAC breakfast   heparin   5,000 Units Subcutaneous Q8H   insulin  aspart  0-5 Units Subcutaneous QHS   insulin  aspart  0-6 Units Subcutaneous TID WC   rosuvastatin   10 mg Oral Daily    Dialysis Orders: [facility] 3h B350 65.2kg  AVF  Hep 2000  Last OP HD 1/02, post wt 67.2kg Hep B SAg neg 07/10/24 Heb B  SAb +Immune 05/15/24  Assessment/Plan: ESRD - on HD MWF, today   Acute CVA  - L posterior MCA territory per MRI - aphasia - had CVA extension  -per primary, in CIR   HTN/Volume - on norvasc  (coreg  dc'd)  - per neuro -> try to maintain SBP 130-150 given L M2 occlusion -will UF as tolerated   Anemia of esrd - Hb 11.1, follow, no esa needs yet   Secondary hyperparathyroidism -po4 acceptable   Dispo: in CIR  Ephriam Stank, MD [facility] 08/18/2024,9:10 AM  LOS: 4 days    "

## 2024-08-19 LAB — GLUCOSE, CAPILLARY
Glucose-Capillary: 114 mg/dL — ABNORMAL HIGH (ref 70–99)
Glucose-Capillary: 131 mg/dL — ABNORMAL HIGH (ref 70–99)
Glucose-Capillary: 158 mg/dL — ABNORMAL HIGH (ref 70–99)
Glucose-Capillary: 214 mg/dL — ABNORMAL HIGH (ref 70–99)

## 2024-08-19 LAB — MAGNESIUM: Magnesium: 2.5 mg/dL — ABNORMAL HIGH (ref 1.7–2.4)

## 2024-08-19 MED ORDER — BRIMONIDINE TARTRATE 0.2 % OP SOLN
1.0000 [drp] | Freq: Two times a day (BID) | OPHTHALMIC | Status: DC
  Administered 2024-08-19 – 2024-08-26 (×14): 1 [drp] via OPHTHALMIC
  Filled 2024-08-19 (×2): qty 5

## 2024-08-19 MED ORDER — GABAPENTIN 100 MG PO CAPS
100.0000 mg | ORAL_CAPSULE | Freq: Three times a day (TID) | ORAL | Status: DC
  Administered 2024-08-19 – 2024-08-26 (×18): 100 mg via ORAL
  Filled 2024-08-19 (×18): qty 1

## 2024-08-19 MED ORDER — DORZOLAMIDE HCL-TIMOLOL MAL 2-0.5 % OP SOLN
1.0000 [drp] | Freq: Two times a day (BID) | OPHTHALMIC | Status: DC
  Administered 2024-08-19 – 2024-08-26 (×14): 1 [drp] via OPHTHALMIC
  Filled 2024-08-19 (×2): qty 10

## 2024-08-19 MED ORDER — BRIMONIDINE TARTRATE 0.15 % OP SOLN: 1.0000 [drp] | Freq: Two times a day (BID) | OPHTHALMIC | Status: DC

## 2024-08-19 MED ORDER — CALCIUM ACETATE (PHOS BINDER) 667 MG PO CAPS
667.0000 mg | ORAL_CAPSULE | Freq: Three times a day (TID) | ORAL | Status: DC
  Administered 2024-08-19 – 2024-08-26 (×20): 667 mg via ORAL
  Filled 2024-08-19 (×23): qty 1

## 2024-08-19 MED ORDER — POLYVINYL ALCOHOL 1.4 % OP SOLN
2.0000 [drp] | OPHTHALMIC | Status: DC | PRN
  Filled 2024-08-19: qty 15

## 2024-08-19 NOTE — Progress Notes (Signed)
 Physical Therapy Session Note  Patient Details  Name: Blake Santana MRN: [id number] Date of Birth: Dec 08, 1970  Today's Date: 08/19/2024 PT Individual Time: 9091-8982 PT Individual Time Calculation (min): 69 min   Short Term Goals: Week 1:  PT Short Term Goal 1 (Week 1): STG=LTG due to LOS  Skilled Therapeutic Interventions/Progress Updates: Patient semi-reclined in bed with wife and interpretor present on entrance to room. Patient alert and agreeable to PT session.   Patient reported unrated pain in R eye/face around eye. Covering physician arrived at beginning of session with PTA communicated pt's pain. Physician contacted pt's ophthalmologist and will have eye drops ordered to assist. Pt denies continued R arm pain as previously reported. Pt required dimmed lighting in main gym due to bright overhead lights causing increase in pain in L eye.   Therapeutic Activity: Bed Mobility: Pt performed supine<>sit on EOB with supervision for safety. Pt sat EOB and donned personal socks and shoes with supervision and continued improvement with R UE management (decreased apraxia) Transfers: Pt performed sit<>stand transfers throughout session with CGA for safety. Pt ambulated throughout session with no AD and with CGA and L head slightly rotated to L that required cues to increase awareness to R side.   Pt wife assisted pt to bathroom to have BM and also with posterior peri-care. Pt wife cleared for in room toileting/transfers with education on use of gait belt and not to use RW due to pt's room set up being small and pt's decreased awareness.  Neuromuscular Re-ed: - Pt cued to locate various fruit in basket with pt able to locate banana easily, but required increased time for similarly shaped items such as apple and pepper. Pt cued to repeat name of fruit but would always reply with no and when pt would find item or think he found it, pt would respond with this? - Pt demonstrated  decreased awareness of surroundings and finding exiting doors out of main gym when cued to locate (difficult to assess how much of pt's receptive aphasia affected his ability) and required increased time and cues to locate. Pt did, however, locate room at end of session when ambulating back to without cuing required.  NMR performed for improvements in motor control and coordination, balance, sequencing, judgement, and self confidence/ efficacy in performing all aspects of mobility at highest level of independence.   Therapeutic Exercise: Pt performed the following exercises with therapist providing the described cuing and facilitation for improvement.   Manual Therapy: Palpation of B upper traps and cervical paraspinals performed with tension noted. Education and rationale provided with pt agreeing to participate in intervention. - soft tissue mobilization provided. Hot pack applied prior to manual therapy to further relax musculature. Pt demonstrated improved cervical rotation following (observable but not formally assessed) as well as decrease in reported discomfort/pain.   Patient semi-reclined in bed at end of session with brakes locked, wife present and cleared for in room transfers, and all needs within reach.      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

## 2024-08-19 NOTE — Progress Notes (Signed)
 Speech Language Pathology Daily Session Note  Patient Details  Name: Heith Haigler MRN: [id number] Date of Birth: March 25, 1971  Today's Date: 08/19/2024 SLP Individual Time: 1115-1200 SLP Individual Time Calculation (min): 45 min  Short Term Goals: Week 1: SLP Short Term Goal 1 (Week 1): STG=LTG due to ELOS  Skilled Therapeutic Interventions: Skilled intervention focused on verbal expression.Pt required max A and made attempts to produce 1-5 in english and in spanish during automatic speech task. He was able to produce four seven and eleven independently.  He made attempts to sing happy birthday but mostly sang the melody with intermittent attempts to produce lyrics during singing task. Pt followed single step commands with max visual models. Object id [id number] completed with mod A, comprehension of y/n simple questions with communication board completed 6/10 accuracy. He was able produce ball and a little bit in spanish when asked if he was hungry. Cont therapy per plan of care.   Pain Pain Assessment Pain Scale: Faces Faces Pain Scale: No hurt  Therapy/Group: Individual Therapy  Recardo LABOR Evelette Hollern 08/19/2024, 11:53 AM

## 2024-08-19 NOTE — Progress Notes (Signed)
 "                                                        PROGRESS NOTE   Subjective/Complaints: No events overnight.  Levels are stable. On a.m. evaluation, patient working with OT, complains of severe pain in his left eye, feels like a throbbing.  Family at bedside, states this has been going on with increasing frequency since October, and that he generally gets shots every few months for glaucoma but has missed the last few.  No other complaints. Review of systems: + Left eye pain + Left facial sensitivity  Objective:   No results found. Recent Labs    08/17/24 0527 08/18/24 1113  WBC 9.8 8.0  HGB 11.1* 11.5*  HCT 33.8* 34.1*  PLT 196 198   Recent Labs    08/17/24 0527 08/18/24 1113  NA 131* 129*  K 4.5 4.9  CL 91* 86*  CO2 28 26  GLUCOSE 114* 178*  BUN 28* 46*  CREATININE 7.20* 9.48*  CALCIUM  7.7* 7.6*    Intake/Output Summary (Last 24 hours) at 08/19/2024 1547 Last data filed at 08/19/2024 1200 Gross per 24 hour  Intake 348 ml  Output 2000 ml  Net -1652 ml        Physical Exam: Vital Signs Blood pressure 132/78, pulse 61, temperature 98.7 F (37.1 C), resp. rate 17, height 5' 2 (1.575 m), weight 69.5 kg, SpO2 96%.   General: No acute distress.  Sitting up in bedside with therapies. 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  No injection or notable discharge.  Mood and affect are appropriate Heart: Regular rate and rhythm no rubs murmurs or extra sounds Lungs: Clear to auscultation, breathing unlabored, no rales or wheezes Abdomen: Positive bowel sounds, soft nontender to palpation, nondistended Extremities: No clubbing, cyanosis, or edema Skin: No evidence of breakdown, no evidence of rash  Neurologic:  Awake, alert, oriented to self; somewhat distracted/restless due to pain on the left eye/face. Babinsky: flexor responses b/l.   Hoffmans: negative b/l Sensory exam: Reduced sensation to light touch throughout left V1  through 3, otherwise grossly intact. Motor exam: 4 out of 5 left upper and lower extremity, 5 out of 5 right upper and lower extremity. Coordination: Some left upper extremity ataxia on finger-nose Cranial nerves:  Assessment/Plan: 1. Functional deficits which require 3+ hours per day of interdisciplinary therapy in a comprehensive inpatient rehab setting. Physiatrist is providing close team supervision and 24 hour management of active medical problems listed below. Physiatrist and rehab team continue to assess barriers to discharge/monitor patient progress toward functional and medical goals  Care Tool:  Bathing    Body parts bathed by patient: Chest, Abdomen, Front perineal area, Right upper leg, Left upper leg, Right lower leg, Left lower leg, Face, Right arm, Buttocks   Body parts bathed by helper: Left arm     Bathing assist Assist Level: Minimal Assistance - Patient > 75%     Upper Body Dressing/Undressing Upper body dressing   What is the patient wearing?: Pull over shirt    Upper body assist Assist Level: Supervision/Verbal cueing    Lower Body Dressing/Undressing Lower body dressing      What is the patient wearing?: Underwear/pull up, Pants     Lower body assist Assist  for lower body dressing: Contact Guard/Touching assist     Toileting Toileting    Toileting assist Assist for toileting: Moderate Assistance - Patient 50 - 74%     Transfers Chair/bed transfer  Transfers assist     Chair/bed transfer assist level: Minimal Assistance - Patient > 75%     Locomotion Ambulation   Ambulation assist      Assist level: Minimal Assistance - Patient > 75% Assistive device: No Device Max distance: 199ft   Walk 10 feet activity   Assist     Assist level: Minimal Assistance - Patient > 75% Assistive device: No Device   Walk 50 feet activity   Assist    Assist level: Minimal Assistance - Patient > 75% Assistive device: No Device    Walk  150 feet activity   Assist Walk 150 feet activity did not occur: Safety/medical concerns (fatigue, weakness)         Walk 10 feet on uneven surface  activity   Assist Walk 10 feet on uneven surfaces activity did not occur: Safety/medical concerns (fatigue, weakness)         Wheelchair     Assist Is the patient using a wheelchair?: Yes Type of Wheelchair: Manual    Wheelchair assist level: Dependent - Patient 0% Max wheelchair distance: >336ft    Wheelchair 50 feet with 2 turns activity    Assist        Assist Level: Dependent - Patient 0%   Wheelchair 150 feet activity     Assist      Assist Level: Dependent - Patient 0%   Blood pressure 132/78, pulse 61, temperature 98.7 F (37.1 C), resp. rate 17, height 5' 2 (1.575 m), weight 69.5 kg, SpO2 96%.  Medical Problem List and Plan: 1. Functional deficits secondary to Left MCA infarct             -patient may  shower             -ELOS/Goals: 1/22 vs 1/24 , Sup-MinA goals, evals today ,  Due to lack of insurance will not have therapy post d/c, perhaps pt may be able to go to [facility] aphasia program   2.  Antithrombotics: -DVT/anticoagulation:  Mechanical: Sequential compression devices, below knee Bilateral lower extremities Pharmaceutical: Heparin              -antiplatelet therapy: Aspirin  and Plavix  for 3 months, then followed by aspirin  alone.   3. Pain Management: Tylenol  prn   1-17: Add gabapentin  100 mg 3 times daily for left facial sensitivity/pain; may be more related to glaucoma as below although seems to separate processes on exam   4. Mood/Behavior/Sleep: LCSW to follow for evaluation and support when available.              -antipsychotic agents: n/a             -sleep: Trazadone prn    5. Neuropsych/cognition: This patient is not capable of making decisions on his  own behalf.   6. Skin/Wound Care: Routine pressure relief measures.    7. Fluids/Electrolytes/Nutrition: Monitor I&O  and weight. Follow up labs monitored by HD.    8.  Acute CVA: Continue DAPT aspirin  and Plavix  for 3 months, then followed by aspirin  alone.  Continue Crestor  and Zetia .  Follow-up outpatient with cardiology for heart monitor.   9.  ESRD: On HD TTS, nephrology following.   10.  Chronic HFpEF: Continue fluid removal per HD.    - No signs  of volume overload Filed Weights   08/17/24 0500 08/18/24 1412 08/18/24 1740  Weight: 67.1 kg 71.5 kg 69.5 kg    11.  Type 2 diabetes: A1c 8.5, poorly controlled.  Used glipizide  10 mg at home. -Monitor glucose ACHS with SSI NovoLog . CBG (last 3)  Recent Labs    08/18/24 2125 08/19/24 0646 08/19/24 1123  GLUCAP 247* 114* 131*  Am CBG in desired range Start low dose glipizide  to control pm CBGs 1-17: Some intermittent highs, but fairly well-controlled.  Monitor.   12.  Anemia of chronic disease: Hemoglobin 10.7.  Monitor CBC.    13.  Hyperlipidemia/CAD: Continue statin, zetia , aspirin  and plavix .    14.  Dental pain: Received IV Unasyn  transition Completed Augmentin  x 4 days f/u dental as OP    1-17: Denies dental pain on exam  15.  Left eye pressure/pain.  Likely secondary to chronic glaucoma.  Gets injections every 2 to 3 months, missed the last injection in October.   - 1-17: Contacted Dr. Austin with ophthalmology, recommended medication management only at this time, appreciate her assistance.  Ultimately, got a hold of patient's outpatient ophthalmology practice with [facility], Dr. Nonah recommended addition of Cosopt  and brimonidine  0.2% eyedrops to the left eye twice daily; added.  Current visual acuity seems to be at his baseline.  Will need to follow-up with their office to resume injections immediately after discharge.    LOS: 5 days A FACE TO FACE EVALUATION WAS PERFORMED  Blake Santana 08/19/2024, 3:47 PM     "

## 2024-08-19 NOTE — Progress Notes (Signed)
 Occupational Therapy Session Note  Patient Details  Name: Blake Santana MRN: [id number] Date of Birth: 11/19/1970  Today's Date: 08/19/2024 OT Individual Time: 8693-8582 OT Individual Time Calculation (min): 71 min    Short Term Goals: Week 1:  OT Short Term Goal 1 (Week 1): STGs = LTGs  Skilled Therapeutic Interventions/Progress Updates:  Skilled OT session completed to address functional transfers, self feeding and BUE strength and coordination. Pt received toileting with wife assisting with transfer and spanish interpreter present, agreeable to participate in therapy. Pt reports 8/10 pain in head, OT provided pain intervention by incorporating rest breaks, pt respectfully declined RN assistance for medication. All functional mobility this session completed ambulatory with RW CGA for RW safety and pacing.   Pt completed toileting with CGA provided by wife for balance. OT provided education on safety awareness to pt and wife as pt is completing ambulatory transfer without gait belt and non-slip socks, OT recommending footwear and gait belt to reduce risk for falls, wife verbalized understanding. Pt completed functional mobility to sink for hand hygiene. Pt completed functional mobility to ADL apt. Pt completed 2x TTB transfer; Trial 1: Min A to manage RW and BLE safely d/t impaired sequencing during task. Trial 2: CGA, VC for sequencing. Pt completed functional mobility to main gym. Pt completed placing green resistance sponge squares in cup to facilitate BUE coordination and self-feeding with RUE. OT modified task to place cup in R visual field to increase independence with task. OT provided built up foam utensils to retrieve green sponges, with A from LUE intermittently pt able to retrieve sponges with spoon, unable to complete with fork with verbal and tactile cues. Pt completed color matching on peg board with RUE, task downgraded to large peg board to increase independence with  functional grasp. Pt prompted to verbalize colors for cognitive retraining; verbalizing here to ensure correct placement of pegs throughout task. Pt displaying challenges with pincer grasp throughout. Pt completed the following exs with medium theraputty to increase RUE strength and independence with ADLs:  -Pincer grasp -Tripod pinch -power grasp -Finger/Thumb opposition Pt reporting fatigue and displaying increased difficulty with theraputty tasks, theraputty removed and pt completed exs listed above with visual and verbal cues with increased time. Pt completed functional mobility back to room, seated EOB with wife present and all needs in reach.  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 Restrictions Weight Bearing Restrictions Per Provider Order: No  Therapy/Group: Individual Therapy  Sh[place]nee [place], MS, OTR/L 08/19/2024, 7:41 AM

## 2024-08-19 NOTE — Progress Notes (Signed)
 " [facility] Progress Note   Subjective:    Seen and examined patient at bedside. No acute events Wife at bedside Tolerated hd yesterday with net uf 2L  Objective Vitals:   08/18/24 1730 08/18/24 1740 08/18/24 2036 08/19/24 0554  BP: 128/67 137/69 (!) 140/71 132/78  Pulse: 63 65 71 61  Resp: 16 16 18 17   Temp:  (!) 97.4 F (36.3 C) 98.7 F (37.1 C)   TempSrc:      SpO2: 98% 96% 94% 96%  Weight:  69.5 kg    Height:       Physical Exam General: Awake, alert, mildly frail, NAD Heart: S1 and S2; No murmurs, gallops, or rubs Lungs: Clear throughout Abdomen: Soft and non-tender Extremities: No LE edema Dialysis Access: AVF   Filed Weights   08/17/24 0500 08/18/24 1412 08/18/24 1740  Weight: 67.1 kg 71.5 kg 69.5 kg    Intake/Output Summary (Last 24 hours) at 08/19/2024 1023 Last data filed at 08/19/2024 0700 Gross per 24 hour  Intake 350 ml  Output 2000 ml  Net -1650 ml    Additional Objective Labs: Basic Metabolic Panel: Recent Labs  Lab 08/14/24 0239 08/17/24 0527 08/18/24 1113  NA 135 131* 129*  K 4.9 4.5 4.9  CL 93* 91* 86*  CO2 26 28 26   GLUCOSE 134* 114* 178*  BUN 50* 28* 46*  CREATININE 10.30* 7.20* 9.48*  CALCIUM  7.1* 7.7* 7.6*  PHOS  --  5.4* 6.7*   Liver Function Tests: Recent Labs  Lab 08/17/24 0527 08/18/24 1113  ALBUMIN 3.6 3.9   No results for input(s): LIPASE, AMYLASE in the last 168 hours. CBC: Recent Labs  Lab 08/14/24 0239 08/17/24 0527 08/18/24 1113  WBC 7.6 9.8 8.0  NEUTROABS 4.2 5.2  --   HGB 10.7* 11.1* 11.5*  HCT 32.6* 33.8* 34.1*  MCV 95.0 95.5 92.4  PLT 146* 196 198   Blood Culture    Component Value Date/Time   SDES BLOOD RIGHT FOREARM 03/29/2020 2250   SPECREQUEST  03/29/2020 2250    BOTTLES DRAWN AEROBIC AND ANAEROBIC Blood Culture adequate volume   CULT  03/29/2020 2250    NO GROWTH 5 DAYS Performed at [facility], [address], [place], [place] [address]    REPTSTATUS  04/03/2020 FINAL 03/29/2020 2250    Cardiac Enzymes: No results for input(s): CKTOTAL, CKMB, CKMBINDEX, TROPONINI in the last 168 hours. CBG: Recent Labs  Lab 08/18/24 0923 08/18/24 1128 08/18/24 1815 08/18/24 2125 08/19/24 0646  GLUCAP 146* 195* 107* 247* 114*   Iron  Studies: No results for input(s): IRON , TIBC, TRANSFERRIN, FERRITIN in the last 72 hours. Lab Results  Component Value Date   INR 1.0 08/06/2024   INR 1.1 11/17/2021   INR 1.26 09/02/2018   Studies/Results: No results found.  Medications:   amLODipine   10 mg Oral Daily   brimonidine   1 drop Left Eye BID   carvedilol   12.5 mg Oral BID WC   Chlorhexidine  Gluconate Cloth  6 each Topical Q0600   Chlorhexidine  Gluconate Cloth  6 each Topical Q0600   clopidogrel   75 mg Oral Daily   dorzolamide -timolol   1 drop Left Eye BID   ezetimibe   10 mg Oral Daily   gabapentin   100 mg Oral TID   glipiZIDE   2.5 mg Oral QAC breakfast   heparin   5,000 Units Subcutaneous Q8H   insulin  aspart  0-5 Units Subcutaneous QHS   insulin  aspart  0-6 Units Subcutaneous TID WC  rosuvastatin   10 mg Oral Daily    Dialysis Orders: [facility] M[facility] 3h B350 65.2kg  AVF  Hep 2000  Last OP HD 1/02, post wt 67.2kg Hep B SAg neg 07/10/24 Heb B SAb +Immune 05/15/24  Assessment/Plan: ESRD - on HD MWF, next on Monday   Acute CVA  - L posterior MCA territory per MRI - aphasia - had CVA extension  -per primary, in CIR   HTN/Volume - on norvasc  (coreg  dc'd)  - per neuro -> try to maintain SBP 130-150 given L M2 occlusion -will UF as tolerated   Anemia of esrd - Hb 11.1, follow, no esa needs yet   Secondary hyperparathyroidism -po4 high, starting phoslo  1 tab tidac   Dispo: in CIR  Ephriam Stank, MD [facility] 08/19/2024,10:23 AM  LOS: 5 days    "

## 2024-08-20 LAB — GLUCOSE, CAPILLARY
Glucose-Capillary: 99 mg/dL (ref 70–99)
Glucose-Capillary: 159 mg/dL — ABNORMAL HIGH (ref 70–99)
Glucose-Capillary: 194 mg/dL — ABNORMAL HIGH (ref 70–99)
Glucose-Capillary: 177 mg/dL — ABNORMAL HIGH (ref 70–99)

## 2024-08-20 NOTE — Progress Notes (Signed)
 "                                                        PROGRESS NOTE   Subjective/Complaints: No events overnight.  Patient asleep on a.m. rounds, no complaints per nursing. Vital stable.  Eating well.  Continent of bowel and bladder.  Last bowel movement 1-18.  Review of systems: + Left eye pain + Left facial sensitivity  Objective:   No results found. Recent Labs    08/18/24 1113  WBC 8.0  HGB 11.5*  HCT 34.1*  PLT 198   Recent Labs    08/18/24 1113  NA 129*  K 4.9  CL 86*  CO2 26  GLUCOSE 178*  BUN 46*  CREATININE 9.48*  CALCIUM  7.6*    Intake/Output Summary (Last 24 hours) at 08/20/2024 2045 Last data filed at 08/20/2024 1800 Gross per 24 hour  Intake 826 ml  Output --  Net 826 ml        Physical Exam: Vital Signs Blood pressure 123/70, pulse 64, temperature 98.4 F (36.9 C), resp. rate 16, height 5' 2 (1.575 m), weight 70.3 kg, SpO2 98%.   General: No acute distress.  Laying in bed, sleeping. 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  No injection or notable discharge. Heart: Regular rate and rhythm no rubs murmurs or extra sounds Lungs: Clear to auscultation, breathing unlabored, no rales or wheezes Abdomen: Positive bowel sounds, soft nontender to palpation, nondistended Extremities: No clubbing, cyanosis, or edema Skin: No evidence of breakdown, no evidence of rash Neuro: Arousable to verbal stimuli, moving all 4 limbs  Prior exams: Neurologic:  Awake, alert, oriented to self; somewhat distracted/restless due to pain on the left eye/face. Babinsky: flexor responses b/l.   Hoffmans: negative b/l Sensory exam: Reduced sensation to light touch throughout left V1 through 3, otherwise grossly intact. Motor exam: 4 out of 5 left upper and lower extremity, 5 out of 5 right upper and lower extremity. Coordination: Some left upper extremity ataxia on finger-nose Cranial nerves: Grossly intact.   Assessment/Plan: 1.  Functional deficits which require 3+ hours per day of interdisciplinary therapy in a comprehensive inpatient rehab setting. Physiatrist is providing close team supervision and 24 hour management of active medical problems listed below. Physiatrist and rehab team continue to assess barriers to discharge/monitor patient progress toward functional and medical goals  Care Tool:  Bathing    Body parts bathed by patient: Chest, Abdomen, Front perineal area, Right upper leg, Left upper leg, Right lower leg, Left lower leg, Face, Right arm, Buttocks   Body parts bathed by helper: Left arm     Bathing assist Assist Level: Minimal Assistance - Patient > 75%     Upper Body Dressing/Undressing Upper body dressing   What is the patient wearing?: Pull over shirt    Upper body assist Assist Level: Supervision/Verbal cueing    Lower Body Dressing/Undressing Lower body dressing      What is the patient wearing?: Underwear/pull up, Pants     Lower body assist Assist for lower body dressing: Contact Guard/Touching assist     Toileting Toileting    Toileting assist Assist for toileting: Moderate Assistance - Patient 50 - 74%     Transfers Chair/bed transfer  Transfers assist     Chair/bed  transfer assist level: Minimal Assistance - Patient > 75%     Locomotion Ambulation   Ambulation assist      Assist level: Minimal Assistance - Patient > 75% Assistive device: No Device Max distance: 137ft   Walk 10 feet activity   Assist     Assist level: Minimal Assistance - Patient > 75% Assistive device: No Device   Walk 50 feet activity   Assist    Assist level: Minimal Assistance - Patient > 75% Assistive device: No Device    Walk 150 feet activity   Assist Walk 150 feet activity did not occur: Safety/medical concerns (fatigue, weakness)         Walk 10 feet on uneven surface  activity   Assist Walk 10 feet on uneven surfaces activity did not occur:  Safety/medical concerns (fatigue, weakness)         Wheelchair     Assist Is the patient using a wheelchair?: Yes Type of Wheelchair: Manual    Wheelchair assist level: Dependent - Patient 0% Max wheelchair distance: >379ft    Wheelchair 50 feet with 2 turns activity    Assist        Assist Level: Dependent - Patient 0%   Wheelchair 150 feet activity     Assist      Assist Level: Dependent - Patient 0%   Blood pressure 123/70, pulse 64, temperature 98.4 F (36.9 C), resp. rate 16, height 5' 2 (1.575 m), weight 70.3 kg, SpO2 98%.  Medical Problem List and Plan: 1. Functional deficits secondary to Left MCA infarct             -patient may  shower             -ELOS/Goals: 1/22 vs 1/24 , Sup-MinA goals, evals today ,  Due to lack of insurance will not have therapy post d/c, perhaps pt may be able to go to [facility] aphasia program   2.  Antithrombotics: -DVT/anticoagulation:  Mechanical: Sequential compression devices, below knee Bilateral lower extremities Pharmaceutical: Heparin              -antiplatelet therapy: Aspirin  and Plavix  for 3 months, then followed by aspirin  alone.   3. Pain Management: Tylenol  prn   1-17: Add gabapentin  100 mg 3 times daily for left facial sensitivity/pain; may be more related to glaucoma as below although seems to separate processes on exam  1-18: No complaints overnight, continue current regimen.   4. Mood/Behavior/Sleep: LCSW to follow for evaluation and support when available.              -antipsychotic agents: n/a             -sleep: Trazadone prn    5. Neuropsych/cognition: This patient is not capable of making decisions on his  own behalf.   6. Skin/Wound Care: Routine pressure relief measures.    7. Fluids/Electrolytes/Nutrition: Monitor I&O and weight. Follow up labs monitored by HD.    8.  Acute CVA: Continue DAPT aspirin  and Plavix  for 3 months, then followed by aspirin  alone.  Continue Crestor  and Zetia .   Follow-up outpatient with cardiology for heart monitor.   9.  ESRD: On HD TTS, nephrology following.   10.  Chronic HFpEF: Continue fluid removal per HD.    - No signs of volume overload Filed Weights   08/18/24 1412 08/18/24 1740 08/20/24 0537  Weight: 71.5 kg 69.5 kg 70.3 kg    11.  Type 2 diabetes: A1c 8.5, poorly controlled.  Used  glipizide  10 mg at home. -Monitor glucose ACHS with SSI NovoLog . CBG (last 3)  Recent Labs    08/20/24 0602 08/20/24 1149 08/20/24 1707  GLUCAP 99 159* 194*  Am CBG in desired range Start low dose glipizide  to control pm CBGs 1-17: Some intermittent highs, but fairly well-controlled.  Monitor.   12.  Anemia of chronic disease: Hemoglobin 10.7.  Monitor CBC.    13.  Hyperlipidemia/CAD: Continue statin, zetia , aspirin  and plavix .    14.  Dental pain: Received IV Unasyn  transition Completed Augmentin  x 4 days f/u dental as OP    1-17: Denies dental pain on exam  15.  Left eye pressure/pain.  Likely secondary to chronic glaucoma.  Gets injections every 2 to 3 months, missed the last injection in October.   - 1-17: Contacted Dr. Austin with ophthalmology, recommended medication management only at this time, appreciate her assistance.  Ultimately, got a hold of patient's outpatient ophthalmology practice with [facility], Dr. Nonah recommended addition of Cosopt  and brimonidine  0.2% eyedrops to the left eye twice daily; added.  Current visual acuity seems to be at his baseline.  Will need to follow-up with their office to resume injections immediately after discharge. 1-18: No further complaints of eye pain per nursing; they know to notify provider if this recurs.  LOS: 6 days A FACE TO FACE EVALUATION WAS PERFORMED  Blake Santana 08/20/2024, 8:45 PM     "

## 2024-08-20 NOTE — Progress Notes (Signed)
 " [facility] Progress Note   Subjective:    Seen and examined patient at bedside. No acute events Resting comfortably  Objective Vitals:   08/19/24 1820 08/19/24 2018 08/20/24 0535 08/20/24 0537  BP: 135/73 (!) 129/50 (!) 141/70   Pulse: 63 62 63   Resp:  18 18   Temp:  97.7 F (36.5 C) 97.8 F (36.6 C)   TempSrc:   Oral   SpO2:  98% 94%   Weight:    70.3 kg  Height:       Physical Exam General: Awake, alert, mildly frail, NAD Heart: S1 and S2; No murmurs, gallops, or rubs Lungs: Clear throughout Abdomen: Soft and non-tender Extremities: No LE edema Dialysis Access: AVF   Filed Weights   08/18/24 1412 08/18/24 1740 08/20/24 0537  Weight: 71.5 kg 69.5 kg 70.3 kg    Intake/Output Summary (Last 24 hours) at 08/20/2024 0819 Last data filed at 08/19/2024 1200 Gross per 24 hour  Intake 118 ml  Output --  Net 118 ml    Additional Objective Labs: Basic Metabolic Panel: Recent Labs  Lab 08/14/24 0239 08/17/24 0527 08/18/24 1113  NA 135 131* 129*  K 4.9 4.5 4.9  CL 93* 91* 86*  CO2 26 28 26   GLUCOSE 134* 114* 178*  BUN 50* 28* 46*  CREATININE 10.30* 7.20* 9.48*  CALCIUM  7.1* 7.7* 7.6*  PHOS  --  5.4* 6.7*   Liver Function Tests: Recent Labs  Lab 08/17/24 0527 08/18/24 1113  ALBUMIN 3.6 3.9   No results for input(s): LIPASE, AMYLASE in the last 168 hours. CBC: Recent Labs  Lab 08/14/24 0239 08/17/24 0527 08/18/24 1113  WBC 7.6 9.8 8.0  NEUTROABS 4.2 5.2  --   HGB 10.7* 11.1* 11.5*  HCT 32.6* 33.8* 34.1*  MCV 95.0 95.5 92.4  PLT 146* 196 198   Blood Culture    Component Value Date/Time   SDES BLOOD RIGHT FOREARM 03/29/2020 2250   SPECREQUEST  03/29/2020 2250    BOTTLES DRAWN AEROBIC AND ANAEROBIC Blood Culture adequate volume   CULT  03/29/2020 2250    NO GROWTH 5 DAYS Performed at [facility], [address], [place], [place] [address]    REPTSTATUS 04/03/2020 FINAL 03/29/2020 2250    Cardiac Enzymes: No  results for input(s): CKTOTAL, CKMB, CKMBINDEX, TROPONINI in the last 168 hours. CBG: Recent Labs  Lab 08/19/24 0646 08/19/24 1123 08/19/24 1658 08/19/24 2031 08/20/24 0602  GLUCAP 114* 131* 158* 214* 99   Iron  Studies: No results for input(s): IRON , TIBC, TRANSFERRIN, FERRITIN in the last 72 hours. Lab Results  Component Value Date   INR 1.0 08/06/2024   INR 1.1 11/17/2021   INR 1.26 09/02/2018   Studies/Results: No results found.  Medications:   amLODipine   10 mg Oral Daily   brimonidine   1 drop Left Eye BID   calcium  acetate  667 mg Oral TID WC   carvedilol   12.5 mg Oral BID WC   Chlorhexidine  Gluconate Cloth  6 each Topical Q0600   Chlorhexidine  Gluconate Cloth  6 each Topical Q0600   clopidogrel   75 mg Oral Daily   dorzolamide -timolol   1 drop Left Eye BID   ezetimibe   10 mg Oral Daily   gabapentin   100 mg Oral TID   glipiZIDE   2.5 mg Oral QAC breakfast   heparin   5,000 Units Subcutaneous Q8H   insulin  aspart  0-5 Units Subcutaneous QHS   insulin  aspart  0-6 Units Subcutaneous TID WC  rosuvastatin   10 mg Oral Daily    Dialysis Orders: [facility] M[facility] 3h B350 65.2kg  AVF  Hep 2000  Last OP HD 1/02, post wt 67.2kg Hep B SAg neg 07/10/24 Heb B SAb +Immune 05/15/24  Assessment/Plan: ESRD - on HD MWF, next on Monday   Acute CVA  - L posterior MCA territory per MRI - aphasia - had CVA extension  -per primary, in CIR   HTN/Volume - on norvasc  (coreg  dc'd)  - per neuro -> try to maintain SBP 130-150 given L M2 occlusion -will UF as tolerated   Anemia of esrd - Hb 11.5 (on target), follow, no esa needs yet   Secondary hyperparathyroidism -po4 high, started phoslo  1 tab tidac   Dispo: in CIR  Blake Stank, MD [facility] 08/20/2024,8:19 AM  LOS: 6 days    "

## 2024-08-21 ENCOUNTER — Encounter (HOSPITAL_COMMUNITY): Payer: Self-pay | Admitting: Physical Medicine & Rehabilitation

## 2024-08-21 ENCOUNTER — Other Ambulatory Visit: Payer: Self-pay

## 2024-08-21 LAB — GLUCOSE, CAPILLARY
Glucose-Capillary: 111 mg/dL — ABNORMAL HIGH (ref 70–99)
Glucose-Capillary: 140 mg/dL — ABNORMAL HIGH (ref 70–99)
Glucose-Capillary: 132 mg/dL — ABNORMAL HIGH (ref 70–99)
Glucose-Capillary: 207 mg/dL — ABNORMAL HIGH (ref 70–99)

## 2024-08-21 NOTE — Progress Notes (Signed)
 Physical Therapy Session Note  Patient Details  Name: Tyion Boylen MRN: [id number] Date of Birth: 09/02/1970  Today's Date: 08/21/2024 PT Individual Time: 0930-1015 PT Individual Time Calculation (min): 45 min   Short Term Goals: Week 1:  PT Short Term Goal 1 (Week 1): STG=LTG due to LOS  Skilled Therapeutic Interventions/Progress Updates:     Pt semi-reclined in bed upon arrival. Spanish interpreter present. Pt denies pain at start of session and agreeable to therapy. Pt did endorse pain in UT throughout session that improved with hot pack. Rest breaks also provided. Session emphasized functional strengthening and mobility as well as balance and coordination with ambulation and stair training. Pt amb to main gym without AD with CGA/min A - veers toward R, VC to remain in middle of hallway. Pt practiced asc/desc 12-6 inch steps with single HR support, VC for sequencing and CGA overall. Seated rest break required before second trial. Increased tone/spasticity noted in R UE causing pain. Pt c/o pain in L UT area - applied hot pack to B UE and R UE while performing B LE exercises - 1x10 LAQ, 1x10 ankle pumps, 1x10 marching with max tactile cues due to aphasia. Pt practiced steps again - no HR, min A with ascent, min/mod A with descent, and VC for sequencing. Pt amb back to room same assist as before. Pt returned to supine in bed with all needs in reach and wife at bedside at end of session.  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

## 2024-08-21 NOTE — Progress Notes (Signed)
 " [facility] Progress Note   Subjective:    Seen and examined patient at bedside. Patient's family member also at bedside. No acute issues. Plan for HD this afternoon.  Objective Vitals:   08/20/24 1754 08/20/24 2225 08/21/24 0648 08/21/24 0649  BP: 123/70 127/66 135/65   Pulse: 64 63 65   Resp:  17 17   Temp:  97.8 F (36.6 C) 98 F (36.7 C)   TempSrc:      SpO2:  100% 95%   Weight:    71.3 kg  Height:       Physical Exam General: Awake, alert, mildly frail, NAD Heart: S1 and S2; No murmurs, gallops, or rubs Lungs: Clear throughout Abdomen: Soft and non-tender Extremities: No LE edema Dialysis Access: AVF  Filed Weights   08/18/24 1740 08/20/24 0537 08/21/24 0649  Weight: 69.5 kg 70.3 kg 71.3 kg    Intake/Output Summary (Last 24 hours) at 08/21/2024 1121 Last data filed at 08/20/2024 1800 Gross per 24 hour  Intake 590 ml  Output --  Net 590 ml    Additional Objective Labs: Basic Metabolic Panel: Recent Labs  Lab 08/17/24 0527 08/18/24 1113  NA 131* 129*  K 4.5 4.9  CL 91* 86*  CO2 28 26  GLUCOSE 114* 178*  BUN 28* 46*  CREATININE 7.20* 9.48*  CALCIUM  7.7* 7.6*  PHOS 5.4* 6.7*   Liver Function Tests: Recent Labs  Lab 08/17/24 0527 08/18/24 1113  ALBUMIN 3.6 3.9   No results for input(s): LIPASE, AMYLASE in the last 168 hours. CBC: Recent Labs  Lab 08/17/24 0527 08/18/24 1113  WBC 9.8 8.0  NEUTROABS 5.2  --   HGB 11.1* 11.5*  HCT 33.8* 34.1*  MCV 95.5 92.4  PLT 196 198   Blood Culture    Component Value Date/Time   SDES BLOOD RIGHT FOREARM 03/29/2020 2250   SPECREQUEST  03/29/2020 2250    BOTTLES DRAWN AEROBIC AND ANAEROBIC Blood Culture adequate volume   CULT  03/29/2020 2250    NO GROWTH 5 DAYS Performed at [facility], [address], [place], [place] [address]    REPTSTATUS 04/03/2020 FINAL 03/29/2020 2250    Cardiac Enzymes: No results for input(s): CKTOTAL, CKMB, CKMBINDEX, TROPONINI in  the last 168 hours. CBG: Recent Labs  Lab 08/20/24 1149 08/20/24 1707 08/20/24 2224 08/21/24 0646 08/21/24 1105  GLUCAP 159* 194* 177* 111* 140*   Iron  Studies: No results for input(s): IRON , TIBC, TRANSFERRIN, FERRITIN in the last 72 hours. Lab Results  Component Value Date   INR 1.0 08/06/2024   INR 1.1 11/17/2021   INR 1.26 09/02/2018   Studies/Results: No results found.  Medications:   amLODipine   10 mg Oral Daily   brimonidine   1 drop Left Eye BID   calcium  acetate  667 mg Oral TID WC   carvedilol   12.5 mg Oral BID WC   Chlorhexidine  Gluconate Cloth  6 each Topical Q0600   Chlorhexidine  Gluconate Cloth  6 each Topical Q0600   clopidogrel   75 mg Oral Daily   dorzolamide -timolol   1 drop Left Eye BID   ezetimibe   10 mg Oral Daily   gabapentin   100 mg Oral TID   glipiZIDE   2.5 mg Oral QAC breakfast   heparin   5,000 Units Subcutaneous Q8H   insulin  aspart  0-5 Units Subcutaneous QHS   insulin  aspart  0-6 Units Subcutaneous TID WC   rosuvastatin   10 mg Oral Daily    Dialysis Orders: [facility] MWF 3h  B350 65.2kg  AVF  Hep 2000  Last OP HD 1/02, post wt 67.2kg Hep B SAg neg 07/10/24 Heb B SAb +Immune 05/15/24  Assessment/Plan: ESRD - on HD MWF, next HD this afternoon   Acute CVA  - L posterior MCA territory per MRI - aphasia - had CVA extension  -per primary, in CIR   HTN/Volume - on norvasc  (coreg  dc'd)  - per neuro -> try to maintain SBP 130-150 given L M2 occlusion -will UF as tolerated   Anemia of esrd - Hb 11.5 (on target), follow, no esa needs yet   Secondary hyperparathyroidism -po4 high, started phoslo  1 tab tidac   Dispo: in CIR  Charmaine Piety, NP [facility] 08/21/2024,11:21 AM  LOS: 7 days    "

## 2024-08-21 NOTE — Progress Notes (Signed)
" °   08/21/24 1629  Vitals  BP (!) 156/72  MAP (mmHg) 97  Pulse Rate 72  ECG Heart Rate 73  Resp 12  Weight 68.9 kg  Oxygen Therapy  SpO2 96 %  O2 Device Room Air  Patient Activity (if Appropriate) In bed  Pulse Oximetry Type Continuous  Oximetry Probe Site Changed No  During Treatment Monitoring  Blood Flow Rate (mL/min) 0 mL/min  Arterial Pressure (mmHg) -1.61 mmHg  Venous Pressure (mmHg) -2.22 mmHg  TMP (mmHg) -42.02 mmHg  Ultrafiltration Rate (mL/min) 0 mL/min  Dialysate Flow Rate (mL/min) 299 ml/min  Duration of HD Treatment -hour(s) 2.99 hour(s)  Cumulative Fluid Removed (mL) per Treatment  -600  Post Treatment  Dialyzer Clearance Lightly streaked  Hemodialysis Intake (mL) 0 mL  Liters Processed 71.8  Fluid Removed (mL) 600 mL  Tolerated HD Treatment Yes  AVG/AVF Arterial Site Held (minutes) 6 minutes  AVG/AVF Venous Site Held (minutes) 6 minutes  Fistula / Graft Left Forearm Arteriovenous fistula  Placement Date/Time: 08/09/17 1616   Placed prior to admission: No  Orientation: Left  Access Location: (c) Forearm  Access Type: Arteriovenous fistula  Site Condition No complications  Fistula / Graft Assessment Present;Thrill;Bruit  Status Deaccessed  Drainage Description None    "

## 2024-08-21 NOTE — Progress Notes (Signed)
 Speech Language Pathology Daily Session Note  Patient Details  Name: Blake Santana MRN: [id number] Date of Birth: 1971/02/17  Today's Date: 08/21/2024 SLP Individual Time: 9154-9070 SLP Individual Time Calculation (min): 44 min  Short Term Goals: Week 1: SLP Short Term Goal 1 (Week 1): STG=LTG due to ELOS  Skilled Therapeutic Interventions: Skilled therapy session focused on dysphagia and communication goals. SLP facilitated session by observing patient consume medications whole in puree and D3 solids. Patient with timely mastication and complete oral clearance. No s/sx of aspiration. Recommend upgrade to D3/thin with medications whole in puree. SLP targeted communication through automatic speech tasks. Patient recalled name with minA and sequenced numbers 1-10 with minA. Patient continues to present with difficulty utilizing yes/no communication board, though was able to ID objects in a FO2 with 100% accuracy given maxA. Patient with increased verbalizations today and spontaneously communicating wants/needs through single words including aqua and cold! Patient left in bed with wife present. Continue POC   Pain None reported   Therapy/Group: Individual Therapy  Leevi Cullars M.A., CCC-SLP 08/21/2024, 7:38 AM

## 2024-08-21 NOTE — Progress Notes (Signed)
 "                                                        PROGRESS NOTE   Subjective/Complaints:  Appreciate nephro consult, no new issues   Global aphasia , severe, unable to get reliable HX grabs hand and then touches head  Review of systems:  Severe aphasia    Objective:   No results found. Recent Labs    08/18/24 1113  WBC 8.0  HGB 11.5*  HCT 34.1*  PLT 198   Recent Labs    08/18/24 1113  NA 129*  K 4.9  CL 86*  CO2 26  GLUCOSE 178*  BUN 46*  CREATININE 9.48*  CALCIUM  7.6*    Intake/Output Summary (Last 24 hours) at 08/21/2024 0743 Last data filed at 08/20/2024 1800 Gross per 24 hour  Intake 826 ml  Output --  Net 826 ml        Physical Exam: Vital Signs Blood pressure 135/65, pulse 65, temperature 98 F (36.7 C), resp. rate 17, height 5' 2 (1.575 m), weight 71.3 kg, SpO2 95%.   General: No acute distress.  Laying in bed, sleeping. 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  No injection or notable discharge. Heart: Regular rate and rhythm no rubs murmurs or extra sounds Lungs: Clear to auscultation, breathing unlabored, no rales or wheezes Abdomen: Positive bowel sounds, soft nontender to palpation, nondistended Extremities: No clubbing, cyanosis, or edema Skin: No evidence of breakdown, no evidence of rash Neuro: Arousable to verbal stimuli, moving all 4 limbs  Prior exams: Neurologic:  Awake, alert, oriented to self; somewhat distracted/restless due to pain on the left eye/face. Babinsky: flexor responses b/l.   Hoffmans: negative b/l Sensory exam: Reduced sensation to light touch throughout left V1 through 3, otherwise grossly intact. Motor exam: 4 out of 5 left upper and lower extremity, 5 out of 5 right upper and lower extremity. Coordination: Some left upper extremity ataxia on finger-nose Cranial nerves: Grossly intact. MSK- No RUE swelling , no pain with ROM   Assessment/Plan: 1. Functional deficits which  require 3+ hours per day of interdisciplinary therapy in a comprehensive inpatient rehab setting. Physiatrist is providing close team supervision and 24 hour management of active medical problems listed below. Physiatrist and rehab team continue to assess barriers to discharge/monitor patient progress toward functional and medical goals  Care Tool:  Bathing    Body parts bathed by patient: Chest, Abdomen, Front perineal area, Right upper leg, Left upper leg, Right lower leg, Left lower leg, Face, Right arm, Buttocks   Body parts bathed by helper: Left arm     Bathing assist Assist Level: Minimal Assistance - Patient > 75%     Upper Body Dressing/Undressing Upper body dressing   What is the patient wearing?: Pull over shirt    Upper body assist Assist Level: Supervision/Verbal cueing    Lower Body Dressing/Undressing Lower body dressing      What is the patient wearing?: Underwear/pull up, Pants     Lower body assist Assist for lower body dressing: Contact Guard/Touching assist     Toileting Toileting    Toileting assist Assist for toileting: Moderate Assistance - Patient 50 - 74%     Transfers Chair/bed transfer  Transfers assist  Chair/bed transfer assist level: Minimal Assistance - Patient > 75%     Locomotion Ambulation   Ambulation assist      Assist level: Minimal Assistance - Patient > 75% Assistive device: No Device Max distance: 165ft   Walk 10 feet activity   Assist     Assist level: Minimal Assistance - Patient > 75% Assistive device: No Device   Walk 50 feet activity   Assist    Assist level: Minimal Assistance - Patient > 75% Assistive device: No Device    Walk 150 feet activity   Assist Walk 150 feet activity did not occur: Safety/medical concerns (fatigue, weakness)         Walk 10 feet on uneven surface  activity   Assist Walk 10 feet on uneven surfaces activity did not occur: Safety/medical concerns (fatigue,  weakness)         Wheelchair     Assist Is the patient using a wheelchair?: Yes Type of Wheelchair: Manual    Wheelchair assist level: Dependent - Patient 0% Max wheelchair distance: >365ft    Wheelchair 50 feet with 2 turns activity    Assist        Assist Level: Dependent - Patient 0%   Wheelchair 150 feet activity     Assist      Assist Level: Dependent - Patient 0%   Blood pressure 135/65, pulse 65, temperature 98 F (36.7 C), resp. rate 17, height 5' 2 (1.575 m), weight 71.3 kg, SpO2 95%.  Medical Problem List and Plan: 1. Functional deficits secondary to Left MCA infarct             -patient may  shower             -ELOS/Goals: 1/22 vs 1/24 , Sup-MinA goals, evals today ,  Due to lack of insurance will not have therapy post d/c, perhaps pt may be able to go to [facility] aphasia program   2.  Antithrombotics: -DVT/anticoagulation:  Mechanical: Sequential compression devices, below knee Bilateral lower extremities Pharmaceutical: Heparin              -antiplatelet therapy: Aspirin  and Plavix  for 3 months, then followed by aspirin  alone.   3. Pain Management: Tylenol  prn   1-17: Add gabapentin  100 mg 3 times daily for left facial sensitivity/pain; may be more related to glaucoma as below although seems to separate processes on exam  1-18: No complaints overnight, continue current regimen.   4. Mood/Behavior/Sleep: LCSW to follow for evaluation and support when available.              -antipsychotic agents: n/a             -sleep: Trazadone prn    5. Neuropsych/cognition: This patient is not capable of making decisions on his  own behalf.   6. Skin/Wound Care: Routine pressure relief measures.    7. Fluids/Electrolytes/Nutrition: Monitor I&O and weight. Follow up labs monitored by HD.    8.  Acute CVA: Continue DAPT aspirin  and Plavix  for 3 months, then followed by aspirin  alone.  Continue Crestor  and Zetia .  Follow-up outpatient with cardiology for  heart monitor.   9.  ESRD: On HD TTS, nephrology following.   10.  Chronic HFpEF: Continue fluid removal per HD.    - No signs of volume overload Filed Weights   08/18/24 1740 08/20/24 0537 08/21/24 0649  Weight: 69.5 kg 70.3 kg 71.3 kg    11.  Type 2 diabetes: A1c 8.5, poorly controlled.  Used glipizide  10 mg at home. -Monitor glucose ACHS with SSI NovoLog . CBG (last 3)  Recent Labs    08/20/24 1707 08/20/24 2224 08/21/24 0646  GLUCAP 194* 177* 111*  Am CBG in desired range Start low dose glipizide  to control pm CBGs 1-17: Some intermittent highs, but fairly well-controlled.  Monitor.   12.  Anemia of chronic disease: Hemoglobin 10.7.  Monitor CBC.    13.  Hyperlipidemia/CAD: Continue statin, zetia , aspirin  and plavix .    14.  Dental pain: Received IV Unasyn  transition Completed Augmentin  x 4 days f/u dental as OP    1-17: Denies dental pain on exam  15.  Left eye pressure/pain.  Likely secondary to chronic glaucoma.  Gets injections every 2 to 3 months, missed the last injection in October.   - 1-17: Contacted Dr. Austin with ophthalmology, recommended medication management only at this time, appreciate her assistance.  Ultimately, got a hold of patient's outpatient ophthalmology practice with [facility], Dr. Nonah recommended addition of Cosopt  and brimonidine  0.2% eyedrops to the left eye twice daily; added.  Current visual acuity seems to be at his baseline.  Will need to follow-up with their office to resume injections immediately after discharge. 1-18: No further complaints of eye pain per nursing; they know to notify provider if this recurs. Overall difficult to get accurate hx, per PT has some trap tighness may be contributing to poorly described c/os LOS: 7 days A FACE TO FACE EVALUATION WAS PERFORMED  Prentice FORBES Compton 08/21/2024, 7:43 AM     "

## 2024-08-21 NOTE — Progress Notes (Signed)
 Occupational Therapy Session Note  Patient Details  Name: Blake Santana MRN: [id number] Date of Birth: 02-24-1971  Today's Date: 08/21/2024 OT Individual Time: 1115-1200 OT Individual Time Calculation (min): 45 min    Short Term Goals: Week 1:  OT Short Term Goal 1 (Week 1): STGs = LTGs   Skilled Therapeutic Interventions/Progress Updates:    Pt received resting in bed with wife present.  Interpretor also present.  Pt followed tactile and visual cues more easily than verbal cues during the session.   Pt's wife stated he bathed last night and did not need a shower. She also stated walking him to the bathroom has been going well.   Pt and wife taken to tub room to practice tub bench transfers and then just stepping over the tub wall. Pt did fine with CGA stepping over tub wall, he practiced 3x.  They already have a BSC at home that they do not need as a BSC so suggested she use it as the shower seat in the tub.   Pt ambulated to orthogym to engage in various exercises to challenge dynamic standing balance, sit to stands,  RUE coordination.  Worked on activities to improve self feeding, holding cup in hand and bringing to mouth, picking up small objects (like grapes).  Initially he has great difficulty coordinating hand but with hand over hand tactile guidance and multiple repetitions, pt improves well and then is able to do the task.  Pt ambulated back to his room with all needs met.  Wife in room with patient.  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

## 2024-08-21 NOTE — Progress Notes (Signed)
 Occupational Therapy Session Note  Patient Details  Name: Blake Santana MRN: [id number] Date of Birth: 06/22/1971  Today's Date: 08/21/2024 OT Individual Time: 9262-9163 OT Individual Time Calculation (min): 59 min    Short Term Goals: Week 1:  OT Short Term Goal 1 (Week 1): STGs = LTGs   Skilled Therapeutic Interventions/Progress Updates:  Pt greeted asleep in supine, pt needed increased time to arouse but then pt agreeable to OT intervention.    Interpreter and wife present for session.   Transfers/bed mobility/functional mobility:  Pt completed supine>sit with supervision. Pt completed functional ambulation to<>from gym with RW and CGA, intermittent MIN cues needed for directional cues.   ADLs:  LB dressing: pt donned socks from EOB with set- up assist.      NMR:  Pt attempted to complete seated Peg board task however pt  with impaired sensation during task presenting with impaired motor planning/apraxia in RUE.   Pt completed seated NMRE task with pt instructed to roll weighted ball forward/backwards with a focus on providing sensory reeducation to RUE.  Graded task up and instructed pt to keep RUE on weighted ball but reach to floor to  on L side only to transport cones form floor level to bench to provide weightbearing to RUE. Graded task up and instructed pt to reach to R and L side to retrieve cones, pt with increased difficulty motor planning reaching to floor on affected R side even with LUE.    Added in cognitive component with pt instructed to use RUE to Match colors to balls ,pt needed initial max hand over hand assist to complete task but then able to complete task with increased time but supervision and MIN cues for technique.   Pt completed seated bimanual task with pt able to Pass ball behind back from sitting position with a focus on bilateral integration. Pt completed task with no motor planning issues noted. Pt even able to pass ball around his head  with great carryover. Pt did need increased time to pass ball from R hand to L d/t impaired sensory and attention to R hand.   Pt completed seated biceps curls holding same weighted ball to promote, NMRE and bilateral integration.   Pt instructed on additional bimanual task with pt instructed to fold washcloths with BUEs, wife reports this is a familiar task to him. Pt completed this task with ease and supervision.                   Ended session with pt supine in bed with all needs within reach and bed alarm activated.                    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

## 2024-08-22 LAB — GLUCOSE, CAPILLARY
Glucose-Capillary: 109 mg/dL — ABNORMAL HIGH (ref 70–99)
Glucose-Capillary: 153 mg/dL — ABNORMAL HIGH (ref 70–99)
Glucose-Capillary: 148 mg/dL — ABNORMAL HIGH (ref 70–99)
Glucose-Capillary: 212 mg/dL — ABNORMAL HIGH (ref 70–99)

## 2024-08-22 MED ORDER — CHLORHEXIDINE GLUCONATE CLOTH 2 % EX PADS
6.0000 | MEDICATED_PAD | Freq: Every day | CUTANEOUS | Status: DC
  Administered 2024-08-23 – 2024-08-24 (×2): 6 via TOPICAL

## 2024-08-22 NOTE — Progress Notes (Signed)
 "                                                        PROGRESS NOTE   Subjective/Complaints:  Appreciate nephro consult, HD on M-W-F Global aphasia , severe, unable to get reliable HX grabs hand and then touches head  Review of systems:  Severe aphasia    Objective:   No results found. No results for input(s): WBC, HGB, HCT, PLT in the last 72 hours.  No results for input(s): NA, K, CL, CO2, GLUCOSE, BUN, CREATININE, CALCIUM  in the last 72 hours.   Intake/Output Summary (Last 24 hours) at 08/22/2024 9177 Last data filed at 08/21/2024 2140 Gross per 24 hour  Intake 540 ml  Output 0 ml  Net 540 ml        Physical Exam: Vital Signs Blood pressure (!) 164/70, pulse 65, temperature 98.7 F (37.1 C), temperature source Oral, resp. rate 17, height 5' 2 (1.575 m), weight 69.1 kg, SpO2 93%.   General: No acute distress.  Laying in bed, sleeping. 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  No injection or notable discharge. Heart: Regular rate and rhythm no rubs murmurs or extra sounds Lungs: Clear to auscultation, breathing unlabored, no rales or wheezes Abdomen: Positive bowel sounds, soft nontender to palpation, nondistended Extremities: No clubbing, cyanosis, or edema Skin: No evidence of breakdown, no evidence of rash Neuro: Arousable to verbal stimuli, moving all 4 limbs  Prior exams: Neurologic:  Awake, alert, oriented to self; somewhat distracted/restless due to pain on the left eye/face. Babinsky: flexor responses b/l.   Hoffmans: negative b/l Sensory exam: Reduced sensation to light touch throughout left V1 through 3, otherwise grossly intact. Motor exam: 4 out of 5 right upper and lower extremity, 5 out of 5 left  upper and lower extremity.  Cranial nerves: Grossly intact. MSK- No RUE swelling , no pain with ROM , no discoloration   Assessment/Plan: 1. Functional deficits which require 3+ hours per day of  interdisciplinary therapy in a comprehensive inpatient rehab setting. Physiatrist is providing close team supervision and 24 hour management of active medical problems listed below. Physiatrist and rehab team continue to assess barriers to discharge/monitor patient progress toward functional and medical goals  Care Tool:  Bathing    Body parts bathed by patient: Chest, Abdomen, Front perineal area, Right upper leg, Left upper leg, Right lower leg, Left lower leg, Face, Right arm, Buttocks   Body parts bathed by helper: Left arm     Bathing assist Assist Level: Minimal Assistance - Patient > 75%     Upper Body Dressing/Undressing Upper body dressing   What is the patient wearing?: Pull over shirt    Upper body assist Assist Level: Supervision/Verbal cueing    Lower Body Dressing/Undressing Lower body dressing      What is the patient wearing?: Underwear/pull up, Pants     Lower body assist Assist for lower body dressing: Contact Guard/Touching assist     Toileting Toileting    Toileting assist Assist for toileting: Moderate Assistance - Patient 50 - 74%     Transfers Chair/bed transfer  Transfers assist     Chair/bed transfer assist level: Minimal Assistance - Patient > 75%     Locomotion Ambulation   Ambulation assist  Assist level: Minimal Assistance - Patient > 75% Assistive device: No Device Max distance: 135ft   Walk 10 feet activity   Assist     Assist level: Minimal Assistance - Patient > 75% Assistive device: No Device   Walk 50 feet activity   Assist    Assist level: Minimal Assistance - Patient > 75% Assistive device: No Device    Walk 150 feet activity   Assist Walk 150 feet activity did not occur: Safety/medical concerns (fatigue, weakness)         Walk 10 feet on uneven surface  activity   Assist Walk 10 feet on uneven surfaces activity did not occur: Safety/medical concerns (fatigue, weakness)          Wheelchair     Assist Is the patient using a wheelchair?: Yes Type of Wheelchair: Manual    Wheelchair assist level: Dependent - Patient 0% Max wheelchair distance: >332ft    Wheelchair 50 feet with 2 turns activity    Assist        Assist Level: Dependent - Patient 0%   Wheelchair 150 feet activity     Assist      Assist Level: Dependent - Patient 0%   Blood pressure (!) 164/70, pulse 65, temperature 98.7 F (37.1 C), temperature source Oral, resp. rate 17, height 5' 2 (1.575 m), weight 69.1 kg, SpO2 93%.  Medical Problem List and Plan: 1. Functional deficits secondary to Left MCA infarct             -patient may  shower             -ELOS/Goals: 1/22 vs 1/24 , Sup-MinA goals, evals today ,  Due to lack of insurance will not have therapy post d/c, perhaps pt may be able to go to [facility] aphasia program   2.  Antithrombotics: -DVT/anticoagulation:  Mechanical: Sequential compression devices, below knee Bilateral lower extremities Pharmaceutical: Heparin              -antiplatelet therapy: Aspirin  and Plavix  for 3 months, then followed by aspirin  alone.   3. Pain Management: Tylenol  prn   1-17: Add gabapentin  100 mg 3 times daily for left facial sensitivity/pain; may be more related to glaucoma as below although seems to separate processes on exam  1-18: No complaints overnight, continue current regimen.   4. Mood/Behavior/Sleep: LCSW to follow for evaluation and support when available.              -antipsychotic agents: n/a             -sleep: Trazadone prn    5. Neuropsych/cognition: This patient is not capable of making decisions on his  own behalf.   6. Skin/Wound Care: Routine pressure relief measures.    7. Fluids/Electrolytes/Nutrition: Monitor I&O and weight. Follow up labs monitored by HD.    8.  Acute CVA: Continue DAPT aspirin  and Plavix  for 3 months, then followed by aspirin  alone.  Continue Crestor  and Zetia .  Follow-up outpatient with  cardiology for heart monitor.   9.  ESRD: On HD TTS, nephrology following.   10.  Chronic HFpEF: Continue fluid removal per HD.    - No signs of volume overload Filed Weights   08/21/24 0649 08/21/24 1629 08/22/24 0500  Weight: 71.3 kg 68.9 kg 69.1 kg    11.  Type 2 diabetes: A1c 8.5, poorly controlled.  Used glipizide  10 mg at home. -Monitor glucose ACHS with SSI NovoLog . CBG (last 3)  Recent Labs  08/21/24 1659 08/21/24 2130 08/22/24 0602  GLUCAP 132* 207* 109*  Am CBG in desired range Start low dose glipizide  to control pm CBGs 1-17: Some intermittent highs, but fairly well-controlled.  Monitor.   12.  Anemia of chronic disease: Hemoglobin 10.7.  Monitor CBC.    13.  Hyperlipidemia/CAD: Continue statin, zetia , aspirin  and plavix .    14.  Dental pain: Received IV Unasyn  transition Completed Augmentin  x 4 days f/u dental as OP    1-17: Denies dental pain on exam  15.  Left eye pressure/pain.  Likely secondary to chronic glaucoma.  Gets injections every 2 to 3 months, missed the last injection in October.   - 1-17: Contacted Dr. Austin with ophthalmology, recommended medication management only at this time, appreciate her assistance.  Ultimately, got a hold of patient's outpatient ophthalmology practice with [facility], Dr. Nonah recommended addition of Cosopt  and brimonidine  0.2% eyedrops to the left eye twice daily; added.  Current visual acuity seems to be at his baseline.  Will need to follow-up with their office to resume injections immediately after discharge. 1-18: No further complaints of eye pain per nursing; they know to notify provider if this recurs. Overall difficult to get accurate hx, on exam has left trap pain , also reported by PT, may have some radiating discomfort to head, but otherwise exam neg LOS: 8 days A FACE TO FACE EVALUATION WAS PERFORMED  Blake Santana 08/22/2024, 8:22 AM     "

## 2024-08-22 NOTE — Progress Notes (Signed)
 Speech Language Pathology Daily Session Note  Patient Details  Name: Blake Santana MRN: [id number] Date of Birth: [birth year]  Today's Date: 08/22/2024 SLP Individual Time: 1345-1445 SLP Individual Time Calculation (min): 60 min  Short Term Goals: Week 1: SLP Short Term Goal 1 (Week 1): STG=LTG due to ELOS  Skilled Therapeutic Interventions: Skilled therapy session focused on communication goals. SLP facilitated session by targeting yes/no questions with use of communication board. Patient now choosing one answer, however remains approx 50% accurate with maxA. SLP targeted expressive language through automatic speech tasks. Patient unable to verbalize name this date, however did verbalize numbers 1-5 and placed written DOW in order given maxA. Lastly, SLP targeted ID of functional objects. Patient with 75% accuracy in a FO4 given maxA. Patient spontaneously verbalized two objects key and comb. Patient left in bed with alarm set and call bell in reach. Continue POC.   Pain None reported  Therapy/Group: Individual Therapy  Shakhia Gramajo M.A., CCC-SLP 08/22/2024, 7:45 AM

## 2024-08-22 NOTE — Progress Notes (Signed)
 Physical Therapy Session Note  Patient Details  Name: Blake Santana MRN: [id number] Date of Birth: Sep 15, 1970  Today's Date: 08/22/2024 PT Individual Time: 1007-1117 PT Individual Time Calculation (min): 70 min   Short Term Goals: Week 1:  PT Short Term Goal 1 (Week 1): STG=LTG due to LOS  Skilled Therapeutic Interventions/Progress Updates: Patient supine in bed on entrance to room. Patient alert and agreeable to PT session.   Patient reported unrated pain/discomfort in L upper trap/cervical paraspinals (manual therapy provided).   Therapeutic Activity: Bed Mobility: Pt performed supine<>sit on EOB independently.  Transfers: Pt performed sit<>stand transfers throughout session with light CGA ad no AD.  Neuromuscular Re-ed: - Pt ambulated throughout session without AD in order to improved safety awareness and dynamic gait balance. Overall pt with CGA/light minA - Pt cued to locate random cones in main gym hallway on R and L side. Pt required mod/max cuing throughout to locate cones with overall close supervision/CGA.  - pt ambulated around main gym and around day room with tidal tank in B UE and PTA providing light perturbations on gait belt and tidal tank to further increase challenge to slight LOB presentation earlier when walking without AD (to the R/L and would have contralateral LE in increased hip flexion and lean to same side but would take increased step length to catch Santana with minA - Pt performed shoulder press with B UE and tidal tank present with cue to keep water as level as possible  NMR performed for improvements in motor control and coordination, balance, sequencing, judgement, and Santana confidence/ efficacy in performing all aspects of mobility at highest level of independence.   Manual Therapy: Palpation of B upper traps performed with trigger points noted (L>R). Education and rationale provided with pt agreeing to participate in intervention. - Trigger point  release to stated area with soft tissue mobilization to follow throughout (addition of STM to cervical paraspinals due to tension present). Pt also with hot pack donned prior to beginning of intervention and then again towards middle. Pt cued to follow visual cue of following PTA cervical rotation and lateral flexor stretch.   Patient supine in bed at end of session with brakes locked, bed alarm set, and all needs within reach.      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

## 2024-08-22 NOTE — Progress Notes (Signed)
 Occupational Therapy Weekly Progress Note  Patient Details  Name: Blake Santana MRN: [id number] Date of Birth: 04/03/1971  Beginning of progress report period: August 15, 2024 End of progress report period: August 22, 2024  Today's Date: 08/22/2024 OT Individual Time: 9169-9069 OT Individual Time Calculation (min): 60 min   STGs not initially set due to initial expected LOS.  STGs  = LTGs  Patient continues to demonstrate the following deficits: unbalanced muscle activation, motor apraxia, and decreased coordination, decreased visual acuity and decreased visual perceptual skills, decreased attention to right, decreased awareness, decreased problem solving, and delayed processing, and decreased sitting balance, decreased standing balance, hemiplegia, and decreased balance strategies and therefore will continue to benefit from skilled OT intervention to enhance overall performance with BADL.  Patient progressing toward long term goals..  Continue plan of care.  OT Short Term Goals Week 1:  OT Short Term Goal 1 (Week 1): STGs = LTGs OT Short Term Goal 1 - Progress (Week 1): Progressing toward goal Week 2:  OT Short Term Goal 1 (Week 2): STGs = LTGs  Skilled Therapeutic Interventions/Progress Updates:      Pt seen for BADL retraining of toileting, bathing, and dressing with a focus on balance, R side awareness, RUE coordination. Pt's wife and interpretor present.   Continues to have challenges with R hand dropping washcloth in shower.  Provided pt with wash mit to use the next time he showers.  He is able to use R hand to wash his hair, don his socks,  hold toothbrush. Worked on bimanual tasks of opening closing deoderant bottle and toothpaste tube.   See ADL documentation below.   Pt ambulated with light CGA to main gym, sat briefly to rest and then back to his room.  Cued pt to find his room,  he knew to turn R out of the gym and then needed 1 cue to keep walking further.  Once in his hallway, he found his room without a cue!    Pt also saying more words and responding to y/n questions more frequently.    Pt opted to rest in bed.  Wife with pt.     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 Restrictions Weight Bearing Restrictions Per Provider Order: No     Pain: Pain Assessment Pain Scale: 0-10 Pain Score: 3  Pain Location: Head Pain Intervention(s): Medication (See eMAR) ADL: ADL Eating: Supervision/safety Grooming: Supervision/safety Where Assessed-Grooming: Standing at sink Upper Body Bathing: Minimal assistance Where Assessed-Upper Body Bathing: Shower Lower Body Bathing: Contact guard Where Assessed-Lower Body Bathing: Shower Upper Body Dressing: Supervision/safety Where Assessed-Upper Body Dressing: Edge of bed Lower Body Dressing: Supervision/safety Where Assessed-Lower Body Dressing: Edge of bed Toileting: Supervision/safety Where Assessed-Toileting: Teacher, Adult Education: Furniture Conservator/restorer Method: Proofreader: Chiropractor Transfer: Scientific Laboratory Technician Method: Ship Broker: Acupuncturist: Administrator, Arts Method: Designer, Industrial/product: Grab bars   Therapy/Group: Individual Therapy  Blake Santana 08/22/2024, 10:42 AM

## 2024-08-22 NOTE — Progress Notes (Signed)
 " [facility] Progress Note   Subjective:    Seen and examined patient at bedside. No acute issues. Minimal UF was removed yesterday ( ) d/t his BP. Per Neuro, maintaining SBP between 130-150. Next HD 1/21.  Objective Vitals:   08/21/24 1701 08/21/24 2131 08/22/24 0500 08/22/24 0555  BP: (!) 145/69 (!) 155/75  (!) 164/70  Pulse: 75 71  65  Resp: 19 17    Temp: 98.1 F (36.7 C) 98.5 F (36.9 C)  98.7 F (37.1 C)  TempSrc: Oral   Oral  SpO2: 92% 94%  93%  Weight:   69.1 kg   Height:       Physical Exam General: Awake, alert, mildly frail, NAD Heart: S1 and S2; No murmurs, gallops, or rubs Lungs: Clear throughout Abdomen: Soft and non-tender Extremities: No LE edema Dialysis Access: AVF  Filed Weights   08/21/24 0649 08/21/24 1629 08/22/24 0500  Weight: 71.3 kg 68.9 kg 69.1 kg    Intake/Output Summary (Last 24 hours) at 08/22/2024 1209 Last data filed at 08/22/2024 0900 Gross per 24 hour  Intake 780 ml  Output 0 ml  Net 780 ml    Additional Objective Labs: Basic Metabolic Panel: Recent Labs  Lab 08/17/24 0527 08/18/24 1113  NA 131* 129*  K 4.5 4.9  CL 91* 86*  CO2 28 26  GLUCOSE 114* 178*  BUN 28* 46*  CREATININE 7.20* 9.48*  CALCIUM  7.7* 7.6*  PHOS 5.4* 6.7*   Liver Function Tests: Recent Labs  Lab 08/17/24 0527 08/18/24 1113  ALBUMIN 3.6 3.9   No results for input(s): LIPASE, AMYLASE in the last 168 hours. CBC: Recent Labs  Lab 08/17/24 0527 08/18/24 1113  WBC 9.8 8.0  NEUTROABS 5.2  --   HGB 11.1* 11.5*  HCT 33.8* 34.1*  MCV 95.5 92.4  PLT 196 198   Blood Culture    Component Value Date/Time   SDES BLOOD RIGHT FOREARM 03/29/2020 2250   SPECREQUEST  03/29/2020 2250    BOTTLES DRAWN AEROBIC AND ANAEROBIC Blood Culture adequate volume   CULT  03/29/2020 2250    NO GROWTH 5 DAYS Performed at [facility], [address] [place], [place] [address]    REPTSTATUS 04/03/2020 FINAL 03/29/2020 2250     Cardiac Enzymes: No results for input(s): CKTOTAL, CKMB, CKMBINDEX, TROPONINI in the last 168 hours. CBG: Recent Labs  Lab 08/21/24 1105 08/21/24 1659 08/21/24 2130 08/22/24 0602 08/22/24 1205  GLUCAP 140* 132* 207* 109* 153*   Iron  Studies: No results for input(s): IRON , TIBC, TRANSFERRIN, FERRITIN in the last 72 hours. Lab Results  Component Value Date   INR 1.0 08/06/2024   INR 1.1 11/17/2021   INR 1.26 09/02/2018   Studies/Results: No results found.  Medications:   amLODipine   10 mg Oral Daily   brimonidine   1 drop Left Eye BID   calcium  acetate  667 mg Oral TID WC   carvedilol   12.5 mg Oral BID WC   Chlorhexidine  Gluconate Cloth  6 each Topical Q0600   Chlorhexidine  Gluconate Cloth  6 each Topical Q0600   clopidogrel   75 mg Oral Daily   dorzolamide -timolol   1 drop Left Eye BID   ezetimibe   10 mg Oral Daily   gabapentin   100 mg Oral TID   glipiZIDE   2.5 mg Oral QAC breakfast   heparin   5,000 Units Subcutaneous Q8H   insulin  aspart  0-5 Units Subcutaneous QHS   insulin  aspart  0-6 Units Subcutaneous TID WC  rosuvastatin   10 mg Oral Daily    Dialysis Orders: [facility] M[facility] 3h B350 65.2kg  AVF  Hep 2000  Last OP HD 1/02, post wt 67.2kg Hep B SAg neg 07/10/24 Heb B SAb +Immune 05/15/24  Assessment/Plan: ESRD - on HD MWF, next HD 1/21   Acute CVA  - L posterior MCA territory per MRI - aphasia - had CVA extension  -per primary, in CIR   HTN/Volume - on norvasc  (coreg  dc'd)  - per neuro -> try to maintain SBP 130-150 given L M2 occlusion -will UF as tolerated   Anemia of esrd - Last Hb 11.5 (on target), follow, no esa needs yet -Re-checking labs   Secondary hyperparathyroidism -po4 high, started phoslo  1 tab tidac   Dispo: in CIR  Charmaine Piety, NP  [facility] 08/22/2024,12:09 PM  LOS: 8 days    "

## 2024-08-23 ENCOUNTER — Encounter (HOSPITAL_COMMUNITY): Payer: Self-pay

## 2024-08-23 ENCOUNTER — Other Ambulatory Visit (HOSPITAL_COMMUNITY): Payer: Self-pay

## 2024-08-23 LAB — RENAL FUNCTION PANEL
Albumin: 3.6 g/dL (ref 3.5–5.0)
Anion gap: 12 (ref 5–15)
BUN: 50 mg/dL — ABNORMAL HIGH (ref 6–20)
CO2: 28 mmol/L (ref 22–32)
Calcium: 7.5 mg/dL — ABNORMAL LOW (ref 8.9–10.3)
Chloride: 88 mmol/L — ABNORMAL LOW (ref 98–111)
Creatinine, Ser: 9.2 mg/dL — ABNORMAL HIGH (ref 0.61–1.24)
GFR, Estimated: 6 mL/min — ABNORMAL LOW
Glucose, Bld: 107 mg/dL — ABNORMAL HIGH (ref 70–99)
Phosphorus: 5.4 mg/dL — ABNORMAL HIGH (ref 2.5–4.6)
Potassium: 6.6 mmol/L (ref 3.5–5.1)
Sodium: 128 mmol/L — ABNORMAL LOW (ref 135–145)

## 2024-08-23 LAB — CBC WITH DIFFERENTIAL/PLATELET
Abs Immature Granulocytes: 0.03 K/uL (ref 0.00–0.07)
Basophils Absolute: 0.1 K/uL (ref 0.0–0.1)
Basophils Relative: 1 %
Eosinophils Absolute: 0.7 K/uL — ABNORMAL HIGH (ref 0.0–0.5)
Eosinophils Relative: 9 %
HCT: 30.1 % — ABNORMAL LOW (ref 39.0–52.0)
Hemoglobin: 10.2 g/dL — ABNORMAL LOW (ref 13.0–17.0)
Immature Granulocytes: 0 %
Lymphocytes Relative: 29 %
Lymphs Abs: 2.4 K/uL (ref 0.7–4.0)
MCH: 32 pg (ref 26.0–34.0)
MCHC: 33.9 g/dL (ref 30.0–36.0)
MCV: 94.4 fL (ref 80.0–100.0)
Monocytes Absolute: 0.8 K/uL (ref 0.1–1.0)
Monocytes Relative: 9 %
Neutro Abs: 4.4 K/uL (ref 1.7–7.7)
Neutrophils Relative %: 52 %
Platelets: 199 K/uL (ref 150–400)
RBC: 3.19 MIL/uL — ABNORMAL LOW (ref 4.22–5.81)
RDW: 12.9 % (ref 11.5–15.5)
WBC: 8.4 K/uL (ref 4.0–10.5)
nRBC: 0 % (ref 0.0–0.2)

## 2024-08-23 LAB — GLUCOSE, CAPILLARY
Glucose-Capillary: 109 mg/dL — ABNORMAL HIGH (ref 70–99)
Glucose-Capillary: 83 mg/dL (ref 70–99)
Glucose-Capillary: 193 mg/dL — ABNORMAL HIGH (ref 70–99)
Glucose-Capillary: 181 mg/dL — ABNORMAL HIGH (ref 70–99)

## 2024-08-23 MED ORDER — HEPARIN SODIUM (PORCINE) 1000 UNIT/ML IJ SOLN: 2500.0000 [IU] | Freq: Once | INTRAMUSCULAR | Status: DC

## 2024-08-23 MED ORDER — HEPARIN SODIUM (PORCINE) 1000 UNIT/ML IJ SOLN
INTRAMUSCULAR | Status: AC
  Filled 2024-08-23: qty 3

## 2024-08-23 MED ORDER — LIDOCAINE HCL (PF) 1 % IJ SOLN: 5.0000 mL | INTRAMUSCULAR | Status: DC | PRN

## 2024-08-23 MED ORDER — HEPARIN SODIUM (PORCINE) 1000 UNIT/ML DIALYSIS: 1000.0000 [IU] | INTRAMUSCULAR | Status: DC | PRN

## 2024-08-23 MED ORDER — DOXERCALCIFEROL 4 MCG/2ML IV SOLN
2.0000 ug | INTRAVENOUS | Status: DC
  Administered 2024-08-23 – 2024-08-25 (×2): 2 ug via INTRAVENOUS
  Filled 2024-08-23: qty 2

## 2024-08-23 MED ORDER — PENTAFLUOROPROP-TETRAFLUOROETH EX AERO: 1.0000 | INHALATION_SPRAY | CUTANEOUS | Status: DC | PRN

## 2024-08-23 MED ORDER — ALTEPLASE 2 MG IJ SOLR: 2.0000 mg | Freq: Once | INTRAMUSCULAR | Status: DC | PRN

## 2024-08-23 MED ORDER — LIDOCAINE-PRILOCAINE 2.5-2.5 % EX CREA: 1.0000 | TOPICAL_CREAM | CUTANEOUS | Status: DC | PRN

## 2024-08-23 MED ORDER — DOXERCALCIFEROL 4 MCG/2ML IV SOLN
INTRAVENOUS | Status: AC
  Filled 2024-08-23: qty 2

## 2024-08-23 MED ORDER — HEPARIN BOLUS VIA INFUSION: 2500.0000 [IU] | Freq: Once | INTRAVENOUS | Status: DC

## 2024-08-23 MED ORDER — HEPARIN BOLUS VIA INFUSION: 2000.0000 [IU] | Freq: Once | INTRAVENOUS | Status: DC | PRN

## 2024-08-23 MED ORDER — HEPARIN SODIUM (PORCINE) 1000 UNIT/ML IJ SOLN: 2000.0000 [IU] | Freq: Once | INTRAMUSCULAR | Status: AC | PRN
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
## 2024-08-23 NOTE — Progress Notes (Incomplete)
 "                                                        PROGRESS NOTE   Subjective/Complaints:    Review of systems:  Severe aphasia    Objective:   No results found. Recent Labs    08/23/24 0540  WBC 8.4  HGB 10.2*  HCT 30.1*  PLT 199    Recent Labs    08/23/24 0540  NA 128*  K 6.6*  CL 88*  CO2 28  GLUCOSE 107*  BUN 50*  CREATININE 9.20*  CALCIUM  7.5*     Intake/Output Summary (Last 24 hours) at 08/23/2024 1025 Last data filed at 08/23/2024 0715 Gross per 24 hour  Intake 720 ml  Output --  Net 720 ml        Physical Exam: Vital Signs Blood pressure (!) 164/71, pulse 63, temperature 98.6 F (37 C), temperature source Oral, resp. rate 13, height 5' 2 (1.575 m), weight 68 kg, SpO2 97%.   General: No acute distress.  Laying in bed, sleeping. 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  No injection or notable discharge. Heart: Regular rate and rhythm no rubs murmurs or extra sounds Lungs: Clear to auscultation, breathing unlabored, no rales or wheezes Abdomen: Positive bowel sounds, soft nontender to palpation, nondistended Extremities: No clubbing, cyanosis, or edema Skin: No evidence of breakdown, no evidence of rash Neuro: Arousable to verbal stimuli, moving all 4 limbs  Prior exams: Neurologic:  Awake, alert, oriented to self; somewhat distracted/restless due to pain on the left eye/face. Babinsky: flexor responses b/l.   Hoffmans: negative b/l Sensory exam: Reduced sensation to light touch throughout left V1 through 3, otherwise grossly intact. Motor exam: 4 out of 5 right upper and lower extremity, 5 out of 5 left  upper and lower extremity.  Cranial nerves: Grossly intact. MSK- No RUE swelling , no pain with ROM , no discoloration   Assessment/Plan: 1. Functional deficits which require 3+ hours per day of interdisciplinary therapy in a comprehensive inpatient rehab setting. Physiatrist is providing close team  supervision and 24 hour management of active medical problems listed below. Physiatrist and rehab team continue to assess barriers to discharge/monitor patient progress toward functional and medical goals  Care Tool:  Bathing    Body parts bathed by patient: Chest, Abdomen, Front perineal area, Right upper leg, Left upper leg, Right lower leg, Left lower leg, Face, Right arm, Buttocks   Body parts bathed by helper: Left arm     Bathing assist Assist Level: Minimal Assistance - Patient > 75%     Upper Body Dressing/Undressing Upper body dressing   What is the patient wearing?: Pull over shirt    Upper body assist Assist Level: Supervision/Verbal cueing    Lower Body Dressing/Undressing Lower body dressing      What is the patient wearing?: Underwear/pull up, Pants     Lower body assist Assist for lower body dressing: Contact Guard/Touching assist     Toileting Toileting    Toileting assist Assist for toileting: Moderate Assistance - Patient 50 - 74%     Transfers Chair/bed transfer  Transfers assist     Chair/bed transfer assist level: Minimal Assistance - Patient > 75%     Locomotion Ambulation   Ambulation  assist      Assist level: Minimal Assistance - Patient > 75% Assistive device: No Device Max distance: 19ft   Walk 10 feet activity   Assist     Assist level: Minimal Assistance - Patient > 75% Assistive device: No Device   Walk 50 feet activity   Assist    Assist level: Minimal Assistance - Patient > 75% Assistive device: No Device    Walk 150 feet activity   Assist Walk 150 feet activity did not occur: Safety/medical concerns (fatigue, weakness)         Walk 10 feet on uneven surface  activity   Assist Walk 10 feet on uneven surfaces activity did not occur: Safety/medical concerns (fatigue, weakness)         Wheelchair     Assist Is the patient using a wheelchair?: Yes Type of Wheelchair: Manual     Wheelchair assist level: Dependent - Patient 0% Max wheelchair distance: >382ft    Wheelchair 50 feet with 2 turns activity    Assist        Assist Level: Dependent - Patient 0%   Wheelchair 150 feet activity     Assist      Assist Level: Dependent - Patient 0%   Blood pressure (!) 164/71, pulse 63, temperature 98.6 F (37 C), temperature source Oral, resp. rate 13, height 5' 2 (1.575 m), weight 68 kg, SpO2 97%.  Medical Problem List and Plan: 1. Functional deficits secondary to Left MCA infarct             -patient may  shower             -ELOS/Goals: 1/22 vs 1/24 , Sup-MinA goals, evals today ,  Due to lack of insurance will not have therapy post d/c, perhaps pt may be able to go to [facility] aphasia program   2.  Antithrombotics: -DVT/anticoagulation:  Mechanical: Sequential compression devices, below knee Bilateral lower extremities Pharmaceutical: Heparin              -antiplatelet therapy: Aspirin  and Plavix  for 3 months, then followed by aspirin  alone.   3. Pain Management: Tylenol  prn   1-17: Add gabapentin  100 mg 3 times daily for left facial sensitivity/pain; may be more related to glaucoma as below although seems to separate processes on exam  1-18: No complaints overnight, continue current regimen.   4. Mood/Behavior/Sleep: LCSW to follow for evaluation and support when available.              -antipsychotic agents: n/a             -sleep: Trazadone prn    5. Neuropsych/cognition: This patient is not capable of making decisions on his  own behalf.   6. Skin/Wound Care: Routine pressure relief measures.    7. Fluids/Electrolytes/Nutrition: Monitor I&O and weight. Follow up labs monitored by HD.    8.  Acute CVA: Continue DAPT aspirin  and Plavix  for 3 months, then followed by aspirin  alone.  Continue Crestor  and Zetia .  Follow-up outpatient with cardiology for heart monitor.   9.  ESRD: On HD TTS, nephrology following.   10.  Chronic HFpEF: Continue  fluid removal per HD.    - No signs of volume overload Filed Weights   08/21/24 1629 08/22/24 0500 08/23/24 0801  Weight: 68.9 kg 69.1 kg 68 kg    11.  Type 2 diabetes: A1c 8.5, poorly controlled.  Used glipizide  10 mg at home. -Monitor glucose ACHS with SSI NovoLog . CBG (last 3)  Recent Labs    08/22/24 1610 08/22/24 2107 08/23/24 0615  GLUCAP 148* 212* 109*  Am CBG in desired range Start low dose glipizide  to control pm CBGs 1-17: Some intermittent highs, but fairly well-controlled.  Monitor.   12.  Anemia of chronic disease: Hemoglobin 10.7.  Monitor CBC.    13.  Hyperlipidemia/CAD: Continue statin, zetia , aspirin  and plavix .    14.  Dental pain: Received IV Unasyn  transition Completed Augmentin  x 4 days f/u dental as OP    1-17: Denies dental pain on exam  15.  Left eye pressure/pain.  Likely secondary to chronic glaucoma.  Gets injections every 2 to 3 months, missed the last injection in October.   - 1-17: Contacted Dr. Austin with ophthalmology, recommended medication management only at this time, appreciate her assistance.  Ultimately, got a hold of patient's outpatient ophthalmology practice with [facility], Dr. Nonah recommended addition of Cosopt  and brimonidine  0.2% eyedrops to the left eye twice daily; added.  Current visual acuity seems to be at his baseline.  Will need to follow-up with their office to resume injections immediately after discharge. 1-18: No further complaints of eye pain per nursing; they know to notify provider if this recurs. Overall difficult to get accurate hx, on exam has left trap pain , also reported by PT, may have some radiating discomfort to head, but otherwise exam neg LOS: 9 days A FACE TO FACE EVALUATION WAS PERFORMED  Blake Santana 08/23/2024, 10:25 AM     "

## 2024-08-23 NOTE — Discharge Instructions (Addendum)
 Inpatient Rehab Discharge Instructions  Blake Santana  Discharge date and time: 08/26/2024  Activities/Precautions/ Functional Status:  Activity: no lifting, driving, or strenuous exercise for until cleared by provider  Diet: renal diet and soft moist foods.   Wound Care: none needed   Functional status:  ___ No restrictions     ___ Walk up steps independently _X__ 24/7 supervision/assistance   ___ Walk up steps with assistance ___ Intermittent supervision/assistance  ___ Bathe/dress independently _X__ Walk with walker    ___ Bathe/dress with assistance ___ Walk Independently    ___ Shower independently ___ Walk with assistance    ___ Shower with assistance __X_ No alcohol      ___ Return to work/school ________  Special Instructions:    My questions have been answered and I understand these instructions. I will adhere to these goals and the provided educational materials after my discharge from the hospital.  Patient/Caregiver Signature _______________________________ Date __________  Clinician Signature _______________________________________ Date __________  Please bring this form and your medication list with you to all your follow-up doctor's appointments.     COMMUNITY REFERRALS UPON DISCHARGE:    [facility] INFORMATION GIVEN TO PATIENT AND GIRLFRIEND  Medical Equipment/Items Ordered:HAS ALL NEEDED EQUIPMENT FROM PAST ADMISSION                                                 Agency/Supplier:NA  MATCH PLACED FOR 30 DAYS OF PRESCRIPTION COVERAGE    STROKE/TIA DISCHARGE INSTRUCTIONS SMOKING Cigarette smoking nearly doubles your risk of having a stroke & is the single most alterable risk factor  If you smoke or have smoked in the last 12 months, you are advised to quit smoking for your health. Most of the excess cardiovascular risk related to smoking disappears within a year of stopping. Ask you doctor about anti-smoking medications [place] Quit Line:  1-800-QUIT NOW Free Smoking Cessation Classes [phone]  CHOLESTEROL Know your levels; limit fat & cholesterol in your diet  Lipid Panel     Component Value Date/Time   CHOL 189 08/07/2024 0227   CHOL 248 (H) 02/29/2024 1119   TRIG 169 (H) 08/07/2024 0227   HDL 33 (L) 08/07/2024 0227   HDL 30 (L) 02/29/2024 1119   CHOLHDL 5.7 08/07/2024 0227   VLDL 34 08/07/2024 0227   LDLCALC 122 (H) 08/07/2024 0227   LDLCALC 174 (H) 02/29/2024 1119     Many patients benefit from treatment even if their cholesterol is at goal. Goal: Total Cholesterol (CHOL) less than 160 Goal:  Triglycerides (TRIG) less than 150 Goal:  HDL greater than 40 Goal:  LDL (LDLCALC) less than 100   BLOOD PRESSURE American Stroke Association blood pressure target is less that 120/80 mm/Hg  Your discharge blood pressure is:  BP: (!) 162/69 Monitor your blood pressure Limit your salt and alcohol  intake Many individuals will require more than one medication for high blood pressure  DIABETES (A1c is a blood sugar average for last 3 months) Goal HGBA1c is under 7% (HBGA1c is blood sugar average for last 3 months)  Diabetes:   Lab Results  Component Value Date   HGBA1C 8.5 (H) 08/07/2024    Your HGBA1c can be lowered with medications, healthy diet, and exercise. Check your blood sugar as directed by your physician Call your physician if you experience unexplained or low blood sugars.  PHYSICAL ACTIVITY/REHABILITATION Goal is 30 minutes at least 4 days per week  Activity: Increase activity slowly,, No driving,, and Walk with assistance, Therapies: Physical Therapy: Outpatient, Occupational Therapy: Outpatient, and Speech Therapy: Outpatient Return to work: N/A Activity decreases your risk of heart attack and stroke and makes your heart stronger.  It helps control your weight and blood pressure; helps you relax and can improve your mood. Participate in a regular exercise program. Talk with your doctor about the best  form of exercise for you (dancing, walking, swimming, cycling).  DIET/WEIGHT Goal is to maintain a healthy weight  Your discharge diet is:  Diet Order             DIET DYS 3 Room service appropriate? Yes; Fluid consistency: Thin  Diet effective now                   Your height is:  Height: 5' 2 (157.5 cm) Your current weight is: Weight: 68.3 kg Your Body Mass Index (BMI) is:  BMI (Calculated): 27.53 Following the type of diet specifically designed for you will help prevent another stroke. Your goal Body Mass Index (BMI) is 19-24. Healthy food habits can help reduce 3 risk factors for stroke:  High cholesterol, hypertension, and excess weight.  RESOURCES Stroke/Support Group:  Call [phone]   STROKE EDUCATION PROVIDED/REVIEWED AND GIVEN TO PATIENT Stroke warning signs and symptoms How to activate emergency medical system (call 911). Medications prescribed at discharge. Need for follow-up after discharge. Personal risk factors for stroke. Pneumonia vaccine given: No Flu vaccine given: No My questions have been answered, the writing is legible, and I understand these instructions.  I will adhere to these goals & educational materials that have been provided to me after my discharge from the hospital.

## 2024-08-23 NOTE — Progress Notes (Signed)
 " [facility] Progress Note   Subjective:    Seen and examined patient on HD. He appears comfortable and tolerating UFG 1L. BP is 153/69. Informed of HD machine clotting. Will start heparin  boluses with HD. Current K+ is 6.6. Ordered 1K bath X then transition to 2K bath thereafter.  Objective Vitals:   08/23/24 0806 08/23/24 0836 08/23/24 0906 08/23/24 0936  BP: (!) 140/64 (!) 155/74 (!) 153/69 (!) 167/72  Pulse: (!) 54 (!) 58 62 62  Resp: 12 (!) 7 11 (!) 8  Temp:      TempSrc:      SpO2: (!) 85% 95% 96% 98%  Weight:      Height:       Physical Exam General: Awake, alert, mildly frail, NAD Heart: S1 and S2; No murmurs, gallops, or rubs Lungs: Clear throughout Abdomen: Soft and non-tender Extremities: No LE edema Dialysis Access: AVF  Filed Weights   08/21/24 1629 08/22/24 0500 08/23/24 0801  Weight: 68.9 kg 69.1 kg 68 kg    Intake/Output Summary (Last 24 hours) at 08/23/2024 1005 Last data filed at 08/23/2024 0715 Gross per 24 hour  Intake 720 ml  Output --  Net 720 ml    Additional Objective Labs: Basic Metabolic Panel: Recent Labs  Lab 08/17/24 0527 08/18/24 1113 08/23/24 0540  NA 131* 129* 128*  K 4.5 4.9 6.6*  CL 91* 86* 88*  CO2 28 26 28   GLUCOSE 114* 178* 107*  BUN 28* 46* 50*  CREATININE 7.20* 9.48* 9.20*  CALCIUM  7.7* 7.6* 7.5*  PHOS 5.4* 6.7* 5.4*   Liver Function Tests: Recent Labs  Lab 08/17/24 0527 08/18/24 1113 08/23/24 0540  ALBUMIN 3.6 3.9 3.6   No results for input(s): LIPASE, AMYLASE in the last 168 hours. CBC: Recent Labs  Lab 08/17/24 0527 08/18/24 1113 08/23/24 0540  WBC 9.8 8.0 8.4  NEUTROABS 5.2  --  4.4  HGB 11.1* 11.5* 10.2*  HCT 33.8* 34.1* 30.1*  MCV 95.5 92.4 94.4  PLT 196 198 199   Blood Culture    Component Value Date/Time   SDES BLOOD RIGHT FOREARM 03/29/2020 2250   SPECREQUEST  03/29/2020 2250    BOTTLES DRAWN AEROBIC AND ANAEROBIC Blood Culture adequate volume   CULT  03/29/2020  2250    NO GROWTH 5 DAYS Performed at [facility], [address] [place], [place] [address]    REPTSTATUS 04/03/2020 FINAL 03/29/2020 2250    Cardiac Enzymes: No results for input(s): CKTOTAL, CKMB, CKMBINDEX, TROPONINI in the last 168 hours. CBG: Recent Labs  Lab 08/22/24 0602 08/22/24 1205 08/22/24 1610 08/22/24 2107 08/23/24 0615  GLUCAP 109* 153* 148* 212* 109*   Iron  Studies: No results for input(s): IRON , TIBC, TRANSFERRIN, FERRITIN in the last 72 hours. Lab Results  Component Value Date   INR 1.0 08/06/2024   INR 1.1 11/17/2021   INR 1.26 09/02/2018   Studies/Results: No results found.  Medications:   amLODipine   10 mg Oral Daily   brimonidine   1 drop Left Eye BID   calcium  acetate  667 mg Oral TID WC   carvedilol   12.5 mg Oral BID WC   Chlorhexidine  Gluconate Cloth  6 each Topical Q0600   clopidogrel   75 mg Oral Daily   dorzolamide -timolol   1 drop Left Eye BID   ezetimibe   10 mg Oral Daily   gabapentin   100 mg Oral TID   glipiZIDE   2.5 mg Oral QAC breakfast   heparin   2,500 Units Intravenous  Once   heparin   5,000 Units Subcutaneous Q8H   insulin  aspart  0-5 Units Subcutaneous QHS   insulin  aspart  0-6 Units Subcutaneous TID WC   rosuvastatin   10 mg Oral Daily    Dialysis Orders: [facility] M[facility] 3h B350 65.2kg  AVF  Hep 2000  Last OP HD 1/02, post wt 67.2kg Hep B SAg neg 07/10/24 Heb B SAb +Immune 05/15/24  Assessment/Plan:  ESRD - on HD MWF, on HD -Informed of HD machine clotting, will start heparin  boluses with HD -Current K+ 6.6. Ordered 1K bath X then transition to 2K bath thereafter.   Acute CVA  - L posterior MCA territory per MRI - aphasia - had CVA extension  -per primary, in CIR   HTN/Volume - on norvasc  (coreg  dc'd)  - per neuro -> try to maintain SBP 130-150 given L M2 occlusion -will UF as tolerated   Anemia of esrd - Last Hb 11.5 (on target), follow, no esa needs yet -Re-checking labs    Secondary hyperparathyroidism -Continue phoslo  1 tab tidac, phos now at goal -Corr Ca 7.8, resuming Hectorol    Dispo: in CIR  Charmaine Piety, NP [facility] 08/23/2024,10:05 AM  LOS: 9 days    "

## 2024-08-23 NOTE — Progress Notes (Signed)
 Physical Therapy Session Note  Patient Details  Name: Blake Santana MRN: [id number] Date of Birth: September 05, 1970  Today's Date: 08/23/2024  Short Term Goals: Week 1:  PT Short Term Goal 1 (Week 1): STG=LTG due to LOS  Pt missed 75 min of skilled therapy due to being off unit for dialysis. Pt nsg stated that pt had to go to HD prior to scheduled afternoon time due to high potassium. Will re-attempt as schedule and pt availability permits.       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

## 2024-08-23 NOTE — Discharge Summary (Signed)
 Physician Discharge Summary  Patient ID: Blake Santana MRN: [id number] DOB/AGE: 56/06/1971 56 y.o.  Admit date: 08/14/2024 Discharge date: 08/26/2024  Discharge Diagnoses:  Principal Problem:   CVA (cerebral vascular accident) [facility]) Active Problems:   Type 2 diabetes mellitus with other diabetic kidney complication (HCC)   Anemia in chronic kidney disease   Dyslipidemia   Hypertension associated with diabetes (HCC)   ESRD on hemodialysis TTS (HCC)   Unspecified protein-calorie malnutrition   Proliferative diabetic retinopathy with macular edema (HCC)   Hyperlipidemia, unspecified   Coagulation defect, unspecified   Hyperkalemia   Secondary hyperparathyroidism of renal origin   Chronic diastolic CHF (congestive heart failure) (HCC)   Non-English speaking patient   Hypertensive emergency   Cerebrovascular accident (CVA) due to occlusion of left middle cerebral artery (HCC)   Discharged Condition: stable  Significant Diagnostic Studies: DG Orthopantogram Result Date: 08/11/2024 EXAM: ORTHOPANTOMOGRAM XRAY, 1 VIEW 08/11/2024 12:11:00 PM TECHNIQUE: Panorex view of the mandible. COMPARISON: None available. CLINICAL HISTORY: Tooth pain with chewing. FINDINGS: DENTAL: There is resorption of maxilla and prominent periapical erosions involving the tooth 1. Tooth 2 is absent. No additional dental caries or periapical erosions identified. BONES: No acute fracture. No malalignment. TMJ: No dislocation. SOFT TISSUES: Unremarkable. IMPRESSION: 1. Resorption of the maxilla and prominent periapical erosions involving tooth 1 in keeping with advanced periodontal disease. 2. Tooth 2 is absent. 3. No acute fracture or mandibular dislocation. Electronically signed by: Dorethia Molt MD MD 08/11/2024 08:23 PM EST RP Workstation: [id number]   VAS US  LOWER EXTREMITY VENOUS (DVT) Result Date: 08/09/2024  Lower Venous DVT Study Patient Name:  Blake Santana  Date of Exam:   08/09/2024 Medical  Rec #: [id number]                 Accession #:    [id number] Date of Birth: 19-May-1971                 Patient Gender: M Patient Age:   54 years Exam Location:  [facility] Procedure:      VAS US  LOWER EXTREMITY VENOUS (DVT) Referring Phys: ARY XU --------------------------------------------------------------------------------  Indications: Stroke.  Risk Factors: None identified. Comparison Study: No prior studies. Performing Technologist: Cordella Collet RVT  Examination Guidelines: A complete evaluation includes B-mode imaging, spectral Doppler, color Doppler, and power Doppler as needed of all accessible portions of each vessel. Bilateral testing is considered an integral part of a complete examination. Limited examinations for reoccurring indications may be performed as noted. The reflux portion of the exam is performed with the patient in reverse Trendelenburg.  +---------+---------------+---------+-----------+----------+--------------+ RIGHT    CompressibilityPhasicitySpontaneityPropertiesThrombus Aging +---------+---------------+---------+-----------+----------+--------------+ CFV      Full           Yes      Yes                                 +---------+---------------+---------+-----------+----------+--------------+ SFJ      Full                                                        +---------+---------------+---------+-----------+----------+--------------+ FV Prox  Full                                                        +---------+---------------+---------+-----------+----------+--------------+  FV Mid   Full                                                        +---------+---------------+---------+-----------+----------+--------------+ FV DistalFull                                                        +---------+---------------+---------+-----------+----------+--------------+ PFV      Full                                                         +---------+---------------+---------+-----------+----------+--------------+ POP      Full           Yes      Yes                                 +---------+---------------+---------+-----------+----------+--------------+ PTV      Full                                                        +---------+---------------+---------+-----------+----------+--------------+ PERO     Full                                                        +---------+---------------+---------+-----------+----------+--------------+   +---------+---------------+---------+-----------+----------+--------------+ LEFT     CompressibilityPhasicitySpontaneityPropertiesThrombus Aging +---------+---------------+---------+-----------+----------+--------------+ CFV      Full           Yes      Yes                                 +---------+---------------+---------+-----------+----------+--------------+ SFJ      Full                                                        +---------+---------------+---------+-----------+----------+--------------+ FV Prox  Full                                                        +---------+---------------+---------+-----------+----------+--------------+ FV Mid   Full                                                        +---------+---------------+---------+-----------+----------+--------------+  FV DistalFull           Yes      Yes                                 +---------+---------------+---------+-----------+----------+--------------+ PFV      Full                                                        +---------+---------------+---------+-----------+----------+--------------+ POP      Full           Yes      Yes                                 +---------+---------------+---------+-----------+----------+--------------+ PTV      Full                                                         +---------+---------------+---------+-----------+----------+--------------+ PERO     Full                                                        +---------+---------------+---------+-----------+----------+--------------+     Summary: RIGHT: - There is no evidence of deep vein thrombosis in the lower extremity.  - No cystic structure found in the popliteal fossa.  LEFT: - There is no evidence of deep vein thrombosis in the lower extremity.  - No cystic structure found in the popliteal fossa.  *See table(s) above for measurements and observations. Electronically signed by Gaile New MD on 08/09/2024 at 7:08:42 PM.    Final    MR ANGIO HEAD WO CONTRAST Result Date: 08/09/2024 EXAM: MR Angiography Head without intravenous Contrast. 08/08/2024 09:52:09 PM TECHNIQUE: Magnetic resonance angiography images of the head without intravenous contrast. Multiplanar 2D and 3D reformatted images are provided for review. COMPARISON: MR Head Without IV Contrast 08/06/2024. CLINICAL HISTORY: Stroke, follow up. FINDINGS: Mild to moderately motion limited study. ANTERIOR CIRCULATION: No significant stenosis of the internal carotid arteries. No significant stenosis of the anterior cerebral arteries. Hypoplastic right A1 ACA. Similar occluded proximal left M2 MCA. No aneurysm. POSTERIOR CIRCULATION: Severe right and moderate left P2 PCA stenosis. No significant stenosis of the basilar artery. No significant stenosis of the vertebral arteries. No aneurysm. IMPRESSION: 1. Similar occluded proximal left M2 MCA. 2. Severe right and moderate left P2 PCA stenosis. Electronically signed by: Gilmore Molt 08/09/2024 03:35 AM EST RP Workstation: [id number]   MR BRAIN WO CONTRAST Result Date: 08/09/2024 EXAM: MRI BRAIN WITHOUT CONTRAST 08/08/2024 09:38:18 PM TECHNIQUE: Multiplanar multisequence MRI of the head/brain was performed without the administration of intravenous contrast. COMPARISON: MRI head 08/06/2024 CLINICAL HISTORY:  Stroke, follow up. FINDINGS: BRAIN AND VENTRICLES: Increasingly confluent and expanded left posterior MCA territory infarct. Increased edema with slight (2 mm) rightward midline shift. Numerous punctate foci of susceptibility artifact throughout the brain. No intracranial hemorrhage. No mass. No  hydrocephalus. The sella is unremarkable. ORBITS: No acute abnormality. SINUSES AND MASTOIDS: No acute abnormality. BONES AND SOFT TISSUES: Normal marrow signal. No acute soft tissue abnormality. IMPRESSION: 1. In comparison to 08/06/2024 MRI , increasingly confluent and expanded left posterior MCA territory infarct. Increased edema with slight (2 mm) rightward midline shift. 2. Numerous punctate foci of susceptibility artifact throughout the brain, possibly prior micro emboli. Electronically signed by: Gilmore Molt 08/09/2024 03:27 AM EST RP Workstation: [id number]   DG Shoulder Right Port Result Date: 08/08/2024 CLINICAL DATA:  Right shoulder pain. EXAM: RIGHT SHOULDER - 1 VIEW COMPARISON:  None Available. FINDINGS: There is no evidence of acute fracture or dislocation. A small chronic appearing deformity is seen along the dorsal aspect of the distal right clavicle. Soft tissues are unremarkable. Mild to moderate severity atelectasis and/or infiltrate is noted within the visualized portion of the right lung base. IMPRESSION: 1. No acute osseous abnormality. Electronically Signed   By: Suzen Dials M.D.   On: 08/08/2024 20:20   ECHOCARDIOGRAM COMPLETE Result Date: 08/07/2024    ECHOCARDIOGRAM REPORT   Patient Name:   Blake Santana Date of Exam: 08/07/2024 Medical Rec #:  [id number]                Height:       62.0 in Accession #:    [id number]               Weight:       146.0 lb Date of Birth:  1971/03/16                BSA:          1.672 m Patient Age:    53 years                 BP:           205/100 mmHg Patient Gender: M                        HR:           93 bpm. Exam Location:  Inpatient  Procedure: 2D Echo, Cardiac Doppler and Color Doppler (Both Spectral and Color            Flow Doppler were utilized during procedure). Indications:    Stroke I63.9  History:        Patient has prior history of Echocardiogram examinations, most                 recent 04/03/2017. Risk Factors:Hypertension.  Sonographer:    Nathanel Devonshire Referring Phys: EDITHA RAM IMPRESSIONS  1. Left ventricular ejection fraction, by estimation, is 60 to 65%. The left ventricle has normal function. The left ventricle has no regional wall motion abnormalities. There is mild left ventricular hypertrophy. Left ventricular diastolic parameters are indeterminate.  2. Right ventricular systolic function is normal. The right ventricular size is normal.  3. The mitral valve is normal in structure. Trivial mitral valve regurgitation. No evidence of mitral stenosis.  4. The aortic valve is normal in structure. Aortic valve regurgitation is not visualized. No aortic stenosis is present.  5. The inferior vena cava is normal in size with greater than 50% respiratory variability, suggesting right atrial pressure of 3 mmHg. Conclusion(s)/Recommendation(s): No intracardiac source of embolism detected on this transthoracic study. Consider a transesophageal echocardiogram to exclude cardiac source of embolism if clinically indicated. FINDINGS  Left Ventricle: Left ventricular ejection fraction, by estimation, is  60 to 65%. The left ventricle has normal function. The left ventricle has no regional wall motion abnormalities. The left ventricular internal cavity size was normal in size. There is  mild left ventricular hypertrophy. Abnormal (paradoxical) septal motion, consistent with left bundle branch block. Left ventricular diastolic parameters are indeterminate. Right Ventricle: The right ventricular size is normal. No increase in right ventricular wall thickness. Right ventricular systolic function is normal. Left Atrium: Left atrial size was  normal in size. Right Atrium: Right atrial size was normal in size. Pericardium: There is no evidence of pericardial effusion. Mitral Valve: The mitral valve is normal in structure. Trivial mitral valve regurgitation. No evidence of mitral valve stenosis. Tricuspid Valve: The tricuspid valve is normal in structure. Tricuspid valve regurgitation is not demonstrated. No evidence of tricuspid stenosis. Aortic Valve: The aortic valve is normal in structure. Aortic valve regurgitation is not visualized. No aortic stenosis is present. Aortic valve mean gradient measures 5.0 mmHg. Aortic valve peak gradient measures 10.0 mmHg. Aortic valve area, by VTI measures 1.60 cm. Pulmonic Valve: The pulmonic valve was normal in structure. Pulmonic valve regurgitation is trivial. No evidence of pulmonic stenosis. Aorta: The aortic root is normal in size and structure. Venous: The inferior vena cava is normal in size with greater than 50% respiratory variability, suggesting right atrial pressure of 3 mmHg. IAS/Shunts: No atrial level shunt detected by color flow Doppler.  LEFT VENTRICLE PLAX 2D LVIDd:         4.50 cm LVIDs:         3.00 cm LV PW:         1.30 cm LV IVS:        1.30 cm LVOT diam:     1.80 cm LV SV:         47 LV SV Index:   28 LVOT Area:     2.54 cm  LV Volumes (MOD) LV vol d, MOD A2C: 71.3 ml LV vol d, MOD A4C: 83.2 ml LV vol s, MOD A2C: 27.2 ml LV vol s, MOD A4C: 32.5 ml LV SV MOD A2C:     44.1 ml LV SV MOD A4C:     83.2 ml LV SV MOD BP:      49.1 ml RIGHT VENTRICLE RV Basal diam:  2.80 cm RV S prime:     15.00 cm/s TAPSE (M-mode): 1.9 cm LEFT ATRIUM             Index        RIGHT ATRIUM           Index LA diam:        3.10 cm 1.85 cm/m   RA Area:     12.20 cm LA Vol (A2C):   40.9 ml 24.46 ml/m  RA Volume:   25.70 ml  15.37 ml/m LA Vol (A4C):   52.9 ml 31.63 ml/m LA Biplane Vol: 47.5 ml 28.40 ml/m  AORTIC VALVE                    PULMONIC VALVE AV Area (Vmax):    1.69 cm     PV Vmax:       1.17 m/s AV Area  (Vmean):   1.47 cm     PV Peak grad:  5.5 mmHg AV Area (VTI):     1.60 cm AV Vmax:           158.00 cm/s AV Vmean:          99.300  cm/s AV VTI:            0.293 m AV Peak Grad:      10.0 mmHg AV Mean Grad:      5.0 mmHg LVOT Vmax:         105.00 cm/s LVOT Vmean:        57.200 cm/s LVOT VTI:          0.184 m LVOT/AV VTI ratio: 0.63  AORTA Ao Root diam: 2.70 cm Ao Asc diam:  2.90 cm  SHUNTS Systemic VTI:  0.18 m Systemic Diam: 1.80 cm Oneil Parchment MD Electronically signed by Oneil Parchment MD Signature Date/Time: 08/07/2024/4:34:40 PM    Final    CT VENOGRAM HEAD Result Date: 08/07/2024 EXAM: CT VENOGRAM WITH CONTRAST 08/07/2024 03:42:47 AM TECHNIQUE: CT venogram of the head/brain was performed with the administration of intravenous contrast. Multiplanar reformatted images are provided for review. MIP images are provided for review. Automated exposure control, iterative reconstruction, and/or weight based adjustment of the mA/kV was utilized to reduce the radiation dose to as low as reasonably achievable. COMPARISON: None available. CLINICAL HISTORY: Dural venous sinus thrombosis suspected FINDINGS: No dural venous sinus thrombosis. No significant stenosis. IMPRESSION: 1. No dural venous sinus thrombosis. Electronically signed by: Gilmore Molt 08/07/2024 04:03 AM EST RP Workstation: [id number]   CT ANGIO HEAD NECK W WO CM Result Date: 08/07/2024 EXAM: CTA HEAD AND NECK WITH AND WITHOUT 08/07/2024 03:42:47 AM TECHNIQUE: CTA of the head and neck was performed with and without the administration of intravenous contrast. Multiplanar 2D and/or 3D reformatted images are provided for review. Automated exposure control, iterative reconstruction, and/or weight based adjustment of the mA/kV was utilized to reduce the radiation dose to as low as reasonably achievable. Stenosis of the internal carotid arteries measured using NASCET criteria. COMPARISON: None available CLINICAL HISTORY: Neuro deficit, acute, stroke suspected  Neuro deficit, acute, stroke suspected FINDINGS: AORTIC ARCH AND ARCH VESSELS: No dissection or arterial injury. No significant stenosis of the brachiocephalic or subclavian arteries. CERVICAL CAROTID ARTERIES: No dissection, arterial injury, or hemodynamically significant stenosis by NASCET criteria. CERVICAL VERTEBRAL ARTERIES: No dissection, arterial injury, or significant stenosis. LUNGS AND MEDIASTINUM: Unremarkable. SOFT TISSUES: No acute abnormality. BONES: No acute abnormality. ANTERIOR CIRCULATION: No significant stenosis of the internal carotid arteries. No significant stenosis of the anterior cerebral arteries. Occluded proximal left M2 MCA branch. No aneurysm. POSTERIOR CIRCULATION: PCAs are patent. Severe bilateral P2 PCA stenosis. No significant stenosis of the basilar artery. Vertebral arteries are patent. Moderate right intradural vertebral artery stenosis. No aneurysm. OTHER: No dural venous sinus thrombosis on this non-dedicated study. Findings discussed with Dr. Khaliqdina via telephone at 3:55 AM. IMPRESSION: 1. Occluded proximal left M2 MCA branch. 2. Severe bilateral P2 PCA stenosis. 3. Moderate right intradural vertebral artery stenosis. Electronically signed by: Gilmore Molt 08/07/2024 04:00 AM EST RP Workstation: [id number]   DG CHEST PORT 1 VIEW Result Date: 08/07/2024 EXAM: 1 VIEW(S) XRAY OF THE CHEST 08/07/2024 02:24:13 AM COMPARISON: 11/17/2021 CLINICAL HISTORY: Hypoxia [id number] Hypoxia FINDINGS: LUNGS AND PLEURA: Low lung volumes. No focal pulmonary opacity. No pleural effusion. No pneumothorax. HEART AND MEDIASTINUM: Cardiomegaly with vascular congestion. BONES AND SOFT TISSUES: No acute osseous abnormality. IMPRESSION: 1. Cardiomegaly with vascular congestion. 2. Low lung volumes. Electronically signed by: Franky Crease MD 08/07/2024 02:26 AM EST RP Workstation: [id number]   MR BRAIN WO CONTRAST Result Date: 08/06/2024 EXAM: MRI BRAIN WITHOUT CONTRAST 08/06/2024 09:36:57 PM  TECHNIQUE: Multiplanar multisequence MRI of the head/brain was performed without the  administration of intravenous contrast. COMPARISON: CT head earlier today. CLINICAL HISTORY: Neuro deficit, acute, stroke suspected FINDINGS: BRAIN AND VENTRICLES: Acute left posterior MCA territory infarct involving the left anterior temporal lobe, overlying parietal lobe and posterior left insula. Edema without mass effect. Numerous punctate foci of susceptibility artifact throughout the brain. No intracranial hemorrhage. No mass. No midline shift. No hydrocephalus. The sella is unremarkable. Normal flow voids. ORBITS: No acute abnormality. SINUSES AND MASTOIDS: No acute abnormality. BONES AND SOFT TISSUES: Normal marrow signal. No acute soft tissue abnormality. IMPRESSION: 1. Acute left posterior MCA territory infarct. 2. Numerous punctate foci of susceptibility artifact throughout the brain, possibly prior micro emboli. Electronically signed by: Gilmore Molt 08/06/2024 09:54 PM EST RP Workstation: [id number]   CT HEAD WO CONTRAST Result Date: 08/06/2024 CLINICAL DATA:  Altered aphasia EXAM: CT HEAD WITHOUT CONTRAST TECHNIQUE: Contiguous axial images were obtained from the base of the skull through the vertex without intravenous contrast. RADIATION DOSE REDUCTION: This exam was performed according to the departmental dose-optimization program which includes automated exposure control, adjustment of the mA and/or kV according to patient size and/or use of iterative reconstruction technique. COMPARISON:  CT brain 11/17/2021 FINDINGS: Brain: Focal cortical and subcortical hypodensity at the left temporoparietal lobe. No hemorrhage. No mass. No significant mass effect or midline shift. The ventricles are nonenlarged Vascular: No hyperdense vessels. Carotid and vertebral calcification. Extensive small vessel calcification. Skull: No fracture Sinuses/Orbits: No acute finding. Other: None IMPRESSION: Focal cortical and subcortical  hypodensity at the left temporoparietal lobe, suspicious for acute to subacute infarct. No hemorrhage. No significant mass effect or midline shift. Electronically Signed   By: Luke Bun M.D.   On: 08/06/2024 18:09    Labs:  Basic Metabolic Panel: Recent Labs  Lab 08/17/24 0527 08/18/24 0536 08/18/24 1113 08/19/24 0704 08/23/24 0540  NA 131*  --  129*  --  128*  K 4.5  --  4.9  --  6.6*  CL 91*  --  86*  --  88*  CO2 28  --  26  --  28  GLUCOSE 114*  --  178*  --  107*  BUN 28*  --  46*  --  50*  CREATININE 7.20*  --  9.48*  --  9.20*  CALCIUM  7.7*  --  7.6*  --  7.5*  MG 2.4 2.5*  --  2.5*  --   PHOS 5.4*  --  6.7*  --  5.4*    CBC: Recent Labs  Lab 08/17/24 0527 08/18/24 1113 08/23/24 0540  WBC 9.8 8.0 8.4  NEUTROABS 5.2  --  4.4  HGB 11.1* 11.5* 10.2*  HCT 33.8* 34.1* 30.1*  MCV 95.5 92.4 94.4  PLT 196 198 199    CBG: Recent Labs  Lab 08/22/24 0602 08/22/24 1205 08/22/24 1610 08/22/24 2107 08/23/24 0615  GLUCAP 109* 153* 148* 212* 109*    Brief HPI:   Fleming Prill is a 54 y.o. male  with PMHx ESRD on HD TTS, HFpEF, DM2 with diabetic retinopathy, anemia of chronic disease, HTN, HLD, PAD who presented to [facility] with difficulty speaking and choking with dinner the night prior. Slurred speech and left sided facial numbness returned around noon. In the BP 231/94 and labs revealed Na 134, K 6.4, BUN 50, cr 1036, Ca 8.2, alb 4.1 and WBC 8 K. NIHSS 7. CT head showed focal cortical and subcortical hypodensity at the left temporoparietal lobe suspicious for acute or subacute infarct. Neurology  consulted and patient underwent MRI showed an acute left posterior MCA territory infarct involving the left anterior temporal lobe, overlying parietal lobe and posterior left insula, some edema without mass effect.  CTA showed left M2 MCA occlusion, severe PCA P2 stenosis.  Patient reported right upper extremity weakness on 1/6 therefore underwent  repeat MRI which showed increase edema and slight midline shift.  Neurology recommended continuation of DAPT antithrombotic asprin and plavix  x 21 days followed by aspirin  monotherapy. A right shoulder x-ray was negative.  Nephrology was consulted for management of ESRD and hyperkalemia.  Panorex was ordered due to complaints of dental pain and noted reabsorption of maxilla and prominent periapical erosions involving tooth 1, advanced peridental disease.  Symptoms have improved with a course of Unasyn .  Patient discharged on Augmentin  to complete 7 days.  He will need to follow-up outpatient for dental care.    Per chart review the patient lives in a 1 level home with 2-3 steps to enter with his spouse.  Prior to arrival he was independent but did not drive at baseline due to visual issues.  He currently requires CGA to min assist with mobility and min assist to max assist with basic ADLs.Therapy evaluations completed due to patient decreased functional mobility was admitted for a comprehensive rehab program.    Inpatient Rehabilitation Course: Blake Santana was admitted to rehab 08/14/2024 for inpatient therapies to consist of PT, ST and OT at least three hours five days a week. Past admission physiatrist, therapy team and rehab RN have worked together to provide customized collaborative inpatient rehab.  Skin/Wound Care: Wound care/signs and symptoms of infection discussed at discharge.    Acute CVA: Continue Crestor  and Zetia .  Follow-up outpatient with cardiology for heart monitor. New DAPT (aspirin /clopidogrel ) through 11/05/24 then aspirin  alone Aspirin  was not ordered on discharge med list- after discussing with CIR team, called patient and instructed patient/family to take 81 mg aspirin  daily.   ESRD: On HD TTS, nephrology following.   Chronic HFpEF: Continue fluid removal per HD. No signs of volume overload. CHF instructions provided.    Hypertension:   (Orthostatic hypotension  was monitored, and blood pressures remained controlled. Patient advised to get blood pressure cuff for monitoring at home. Patient reports access to BP cuff. Education for BP parameters discussed. The patient is scheduled to establish care/follow up with PCP upon discharge)   Hyperlipidemia: Continue statin and zetia    T2DM: A1c 8.5, poorly controlled.  Used glipizide  10 mg at home. Monitored glucose ACHS with SSI NovoLog . Resumed low dose glipizide  to control pm CBGs. Some intermittent highs, but fairly well-controlled.    Anemia of chronic disease: Hemoglobin overall stable at 10.7.   Dental pain: Received IV Unasyn  transition Completed Augmentin  x 4 days f/u dental as OP   Retinopathy r/t diabetes:  Likely secondary to chronic glaucoma. Gets injections every 2 to 3 months, missed the last injection in October. Dr. Nonah recommended addition of Cosopt  and brimonidine  0.2% eyedrops to the left eye twice daily; no further complaints of eye pain. Follow up immediately to resume injections.   Planned Outpatient Follow-Up:  -[facility] -Nephrology  -PCP -PM&R -[facility]   Rehab course: During patient's stay in rehab weekly team conferences were held to monitor patient's progress, set goals and discuss barriers to discharge. At admission, patient required CGA to min assist with mobility and min assist to max assist with basic ADLs   Occupational Therapy: Patient has met 9 of 10 long  term goals due to improved activity tolerance, improved balance, functional use of right upper and right lower extremity, and improved coordination.  Patient to discharge at overall supervision level with self care and CGA transfers and standing balance. Patient's care partner is independent to provide the necessary physical assistance at discharge.  He/She will benefit from ongoing OT in outpatient setting to continue to advance functional skills in the area of BADL.   Physical Therapy:  Patient has met 6 of 8 long term goals due to improved activity tolerance, improved balance, improved postural control, increased strength, decreased pain, ability to compensate for deficits, improved attention, improved awareness, and improved coordination.  Patient to discharge at an ambulatory level Supervision for transfers and CGA for ambulation. He  requires CGA to ambulate safely due to poor visual deficits (chronic) and decreased dynamic standing balance.   He/She will benefit from ongoing skilled PT services in outpatient setting to continue to advance safe functional mobility, address ongoing impairments in strength, ROM, balance, endurance, gait, and minimize fall risk.   Speech Therapy: Patient has met 3 of 3 long term goals.  Patient to discharge at overall Max;Min level.  Pt is currently an overall maxA for communicative tasks and requires min cues for utilization of swallowing compensatory strategies to minimize overt s/sx of aspiration with D3/thin diet. Pt/family education complete and pt will discharge home with 24 hour supervision from friends/family/etc. Pt would benefit from f/u ST services to maximize communication in order to maximize functional independence.    Discharge plan was discussed with patient and his wife at bedside, they verbalized understanding and agreed with it.      Disposition:  Discharge disposition: 01-Home or Self Care        Diet: Renal   Special Instructions:  -Due to lack of insurance will not have therapy post d/c, perhaps pt may be able to go to [facility] aphasia program.   -No driving or operating heavy machinery until cleared by provider  -No smoking or alcohol  or illicit drug use    Discharge Instructions     Ambulatory referral to Neurology   Complete by: As directed    An appointment is requested in approximately: 4 weeks   Ambulatory referral to Physical Medicine Rehab   Complete by: As directed       Allergies as of 08/26/2024    No Known Allergies      Medication List     PAUSE taking these medications    lanthanum  1000 MG chewable tablet Wait to take this until your doctor or other care provider tells you to start again. Commonly known as: FOSRENOL  Chew 1 tablet (1,000 mg total) by mouth 2 (two) times daily with a meal.       STOP taking these medications    amoxicillin -clavulanate 500-125 MG tablet Commonly known as: Augmentin    aspirin -acetaminophen -caffeine 250-250-65 MG tablet Commonly known as: EXCEDRIN MIGRAINE       TAKE these medications    acetaminophen  500 MG tablet Commonly known as: TYLENOL  Take 1,000 mg by mouth every 6 (six) hours as needed for moderate pain or headache.   AgaMatrix Ultra-Thin Lancets Misc Check blood glucose twice daily before meals.   TRUEplus Lancets 28G Misc Use como se indica. (Use as directed)   amLODipine  10 MG tablet Commonly known as: NORVASC  Tome 1 tableta (10 mg en total) por va oral diariamente. (Take 1 tablet (10 mg total) by mouth daily.)   artificial tears ophthalmic solution Place 2 drops  into the left eye as needed for dry eyes.   brimonidine  0.2 % ophthalmic solution Commonly known as: ALPHAGAN  Place 1 drop into the left eye 2 (two) times daily.   calcitRIOL  0.5 MCG capsule Commonly known as: ROCALTROL  Take 6 capsules (3 mcg total) by mouth 2 (two) times daily.   calcium  acetate 667 MG capsule Commonly known as: PHOSLO  Take 1 capsule (667 mg total) by mouth 3 (three) times daily with meals.   calcium  carbonate 500 MG chewable tablet Commonly known as: TUMS - dosed in mg elemental calcium  Chew 5 tablets (1,000 mg of elemental calcium  total) by mouth 4 (four) times daily.   carvedilol  12.5 MG tablet Commonly known as: COREG  Take 1 tablet (12.5 mg total) by mouth 2 (two) times daily with a meal. What changed: when to take this   clopidogrel  75 MG tablet Commonly known as: PLAVIX  Tome 1 tableta (75 mg en total) por va  oral diariamente. (Take 1 tablet (75 mg total) by mouth daily.)   dorzolamide -timolol  2-0.5 % ophthalmic solution Commonly known as: COSOPT  Place 1 drop into the left eye 2 (two) times daily.   ezetimibe  10 MG tablet Commonly known as: ZETIA  Tome 1 tableta (10 mg en total) por va oral diariamente. (Take 1 tablet (10 mg total) by mouth daily.)   gabapentin  100 MG capsule Commonly known as: NEURONTIN  Take 1 capsule (100 mg total) by mouth 3 (three) times daily.   glipiZIDE  5 MG tablet Commonly known as: GLUCOTROL  Take one-half tablet (2.5mg  total) orally once a day, before breakfast. Tome medio comprimido (2,5mg  total) por va oral una vez al da, antes del desayuno. What changed:  medication strength how much to take   rosuvastatin  10 MG tablet Commonly known as: Crestor  Tome 1 tableta (10 mg en total) por va oral diariamente. (Take 1 tablet (10 mg total) by mouth daily.)   True Metrix Blood Glucose Test test strip Generic drug: glucose blood Use segn las indicaciones. (Use as instructed)       ASK your doctor about these medications    aspirin  EC 81 MG tablet Take 81 mg by mouth daily. Swallow whole. Ask about: Which instructions should I use?        Follow-up Information     [place], [place], PA-C Follow up.   Specialty: Physician Assistant Why: Call for an appointment within 1-2 weeks after discharge. Contact information: [address] [place] [address] [phone]         Carilyn Prentice BRAVO, MD Follow up.   Specialty: Physical Medicine and Rehabilitation Why: Office will call for appointment Contact information: [address] [place] [address] [place] [address] [phone]         [facility] Follow up.   Why: Call for an appointment at discharge to resume injections for chronic glaucoma. Contact information: [address] [place] [address] [phone]         [facility] Follow up.   Specialty: Neurology Why: Call for an appointment for follow up of stroke. Contact information: [address] [place]  [address] [phone]                Signed: Daphne LOISE Satterfield 08/23/2024, 12:34 PM

## 2024-08-23 NOTE — Progress Notes (Signed)
 SLP Cancellation Note  Patient Details Name: Blake Santana MRN: [id number] DOB: [birth year]   Cancelled treatment:        Patient scheduled for a 60 minute speech therapy treatment. Per nursing, pt away completing dialysis. Therefore, pt missed 60 minutes of skilled intervention. SLP plan to follow up as patient and clinician schedule allows.                                                                                                 Joane GORMAN Fuss 08/23/2024, 11:37 AM

## 2024-08-23 NOTE — Patient Care Conference (Signed)
 Inpatient RehabilitationTeam Conference and Plan of Care Update Date: 08/23/2024   Time: 10:20 AM    Patient Name: Blake Santana      Medical Record Number: [id number]  Date of Birth: 11/26/1970 Sex: Male         Room/Bed: 4M13C/4M13C-01 Payor Info: Payor: MEDICAID POTENTIAL / Plan: MEDICAID POTENTIAL / Product Type: *No Product type* /    Admit Date/Time:  08/14/2024  5:43 PM  Primary Diagnosis:  CVA (cerebral vascular accident) [facility])  Hospital Problems: Principal Problem:   CVA (cerebral vascular accident) (HCC) Active Problems:   Type 2 diabetes mellitus with other diabetic kidney complication (HCC)   Anemia in chronic kidney disease   Dyslipidemia   Hypertension associated with diabetes (HCC)   ESRD on hemodialysis TTS (HCC)   Unspecified protein-calorie malnutrition   Proliferative diabetic retinopathy with macular edema (HCC)   Hyperlipidemia, unspecified   Coagulation defect, unspecified   Hyperkalemia   Secondary hyperparathyroidism of renal origin   Chronic diastolic CHF (congestive heart failure) (HCC)   Non-English speaking patient   Hypertensive emergency   Cerebrovascular accident (CVA) due to occlusion of left middle cerebral artery [facility])    Expected Discharge Date: Expected Discharge Date: 08/26/24  Team Members Present: Physician leading conference: Dr. Prentice Compton Social Worker Present: Rhoda Clement, LCSW Nurse Present: Barnie Ronde, RN PT Present: Sherlean Perks, PT;Dominic Epworth, PTA OT Present: Recardo Maxwell, OT SLP Present: Recardo Cowing, SLP     Current Status/Progress Goal Weekly Team Focus  Bowel/Bladder   Pt oliguric, last BM 1/20. Cont. of both   Remain continent of both, Regular stools   Assist with toileting needs    Swallow/Nutrition/ Hydration   D3/thin   minA  tolerance, use of comepensatory strategies for oral clearance    ADL's   CGA overall with all self care, min to mod verbal cues to attend to R side    supervision to CGA (supervision overall except for CGA with bathing, LB dressing and tub transfer)   RUE NMR, balalnce, R side awareness, ADL training, pt/family education    Mobility   bed mobility/transfers = supervision; gait/stairs = CGA (poor visual deficits lead to poor safety awareness and requires consistent cuing to safely navigate environement)   overall supervision and CGA for stairs  barriers = aphasia; Foucs = NMRE, stairs, gait, dynamic standing balance, safety awareness, pt and family ed    Communication   profound global aphasia, apraxia   maxA   method of communication, yes/no board, expressive language and automatic speech tasks    Safety/Cognition/ Behavioral Observations               Pain   Pt denies pain at this time   <3 pain score   Assess pain Q shift and PRN    Skin      N/a           Discharge Planning:    Home with girlfreind and children between all of them can have 24/7 care. Girlfriend works 7p-10p daily. Aware will not be eligible for services at discharge due to uninsured. Will try to get charity equipment and give pro bono clinics resources   Team Discussion: Patient admitted post CVA with history of orthopedic injury; ambulates with a limp; now with poor proprioception, visual deficits, retinopathy,  aphasia and apraxia. HTN monitored due to M2 segment tightness. Progress limited by visual deficits with apraxia, and pain; trapezius tightness with perceptual deficits.   Patient on target to meet  rehab goals: yes, currently needs CGA for ADLs and supervision for transfers. Due to expressive aphasia; stratus and interpreter are ineffective; patient responds best to demonstrational cues.  Needs supervision for transfers and able to ambulate but with loss of balance noted.   *See Care Plan and progress notes for long and short-term goals.   Revisions to Treatment Plan:  Low tech AAC board; yes/no board H2O protocol   Teaching  Needs: Safety, medications, transfers, toileting, etc.   Current Barriers to Discharge: Decreased caregiver support, Insurance for SNF coverage, and lack of insurance for follow up services, Hemodialysis. Home is currently in foreclosure status and S.O. works  Designer, Fashion/clothing to Barriers: Family education M[employer] medication discount card Referral to [facility]; [employer] speech group     Medical Summary Current Status: End-stage renal disease with hyper kalemia managed by nephrology.  Aphasia improving slightly still apraxic with right upper extremity  Barriers to Discharge: Electrolyte abnormality;Renal Insufficiency/Failure   Possible Resolutions to [employer] Focus: Continue dialysis as per nephrology, family training   Continued Need for Acute Rehabilitation Level of Care: The patient requires daily medical management by a physician with specialized training in physical medicine and rehabilitation for the following reasons: Direction of a multidisciplinary physical rehabilitation program to maximize functional independence : Yes Medical management of patient stability for increased activity during participation in an intensive rehabilitation regime.: Yes Analysis of laboratory values and/or radiology reports with any subsequent need for medication adjustment and/or medical intervention. : Yes   I attest that I was present, lead the team conference, and concur with the assessment and plan of the team.   Fredericka Sober B 08/23/2024, 1:38 PM

## 2024-08-23 NOTE — Progress Notes (Signed)
 Received patient in bed to unit.  Alert and oriented.  Informed consent signed and in chart.   TX duration:3  Patient tolerated well.  Transported back to the room  Alert, without acute distress.  Hand-off given to patient's nurse.   Access used: AVF Access issues: NA  Total UF removed: 1L Medication(s) given: Hectorol     08/23/24 1222  Vitals  Temp 98.2 F (36.8 C)  Temp Source Oral  BP (!) 160/72  Pulse Rate 64  Resp (!) 7  Oxygen Therapy  SpO2 92 %  O2 Device Room Air  Patient Activity (if Appropriate) In bed  Pulse Oximetry Type Continuous  During Treatment Monitoring  Blood Flow Rate (mL/min) 399 mL/min  Arterial Pressure (mmHg) -219.79 mmHg  Venous Pressure (mmHg) 204.43 mmHg  TMP (mmHg) 5.25 mmHg  Ultrafiltration Rate (mL/min) 840 mL/min  Dialysate Flow Rate (mL/min) 300 ml/min  Dialysate Potassium Concentration 2  Dialysate Calcium  Concentration 2.5  Duration of HD Treatment -hour(s) 1.21 hour(s)  Cumulative Fluid Removed (mL) per Treatment  620.29  HD Safety Checks Performed Yes  Intra-Hemodialysis Comments Tx completed;Tolerated well  Post Treatment  Dialyzer Clearance Clear  Liters Processed 72.2  Fluid Removed (mL) 1000 mL  Tolerated HD Treatment Yes  AVG/AVF Arterial Site Held (minutes) 7 minutes  AVG/AVF Venous Site Held (minutes) 7 minutes  Fistula / Graft Left Forearm Arteriovenous fistula  Placement Date/Time: 08/09/17 1616   Placed prior to admission: No  Orientation: Left  Access Location: (c) Forearm  Access Type: Arteriovenous fistula  Site Condition No complications  Fistula / Graft Assessment Present;Thrill;Bruit  Status Deaccessed  Needle Size 15  Drainage Description None      [facility]

## 2024-08-23 NOTE — Progress Notes (Signed)
 Occupational Therapy Session Note  Patient Details  Name: Blake Santana MRN: [id number] Date of Birth: [birth year]  Today's Date: 08/23/2024 OT Missed Time: 60 Minutes Missed Time Reason: Unavailable (comment) (dialysis)   Short Term Goals: Week 1:  OT Short Term Goal 1 (Week 1): STGs = LTGs OT Short Term Goal 1 - Progress (Week 1): Progressing toward goal Week 2:  OT Short Term Goal 1 (Week 2): STGs = LTGs  Skilled Therapeutic Interventions/Progress Updates:    Pt taken to dialysis this morning and has been off the unit.  Unable to see pt for therapy this am.  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

## 2024-08-24 LAB — RENAL FUNCTION PANEL
Albumin: 3.6 g/dL (ref 3.5–5.0)
Anion gap: 15 (ref 5–15)
BUN: 32 mg/dL — ABNORMAL HIGH (ref 6–20)
CO2: 27 mmol/L (ref 22–32)
Calcium: 7.9 mg/dL — ABNORMAL LOW (ref 8.9–10.3)
Chloride: 89 mmol/L — ABNORMAL LOW (ref 98–111)
Creatinine, Ser: 7.28 mg/dL — ABNORMAL HIGH (ref 0.61–1.24)
GFR, Estimated: 8 mL/min — ABNORMAL LOW
Glucose, Bld: 165 mg/dL — ABNORMAL HIGH (ref 70–99)
Phosphorus: 4.7 mg/dL — ABNORMAL HIGH (ref 2.5–4.6)
Potassium: 4.9 mmol/L (ref 3.5–5.1)
Sodium: 130 mmol/L — ABNORMAL LOW (ref 135–145)

## 2024-08-24 LAB — GLUCOSE, CAPILLARY
Glucose-Capillary: 150 mg/dL — ABNORMAL HIGH (ref 70–99)
Glucose-Capillary: 119 mg/dL — ABNORMAL HIGH (ref 70–99)
Glucose-Capillary: 147 mg/dL — ABNORMAL HIGH (ref 70–99)
Glucose-Capillary: 194 mg/dL — ABNORMAL HIGH (ref 70–99)

## 2024-08-24 MED ORDER — CHLORHEXIDINE GLUCONATE CLOTH 2 % EX PADS: 6.0000 | MEDICATED_PAD | Freq: Every day | CUTANEOUS | Status: DC

## 2024-08-24 NOTE — Progress Notes (Addendum)
 Patient ID: Blake Santana, male   DOB: Sep 15, 1970, 54 y.o.   MRN: [id number]  Pt requests letter for court due to unable to speak or write due to CVA. Will have MD signed and get to pt and girlfriend.   1:39 PM Gave MD signed letter to son who was here and attending therapies with pt.

## 2024-08-24 NOTE — Progress Notes (Signed)
 "                                                        PROGRESS NOTE   Subjective/Complaints:  Elevated K+ managed by Nephro in HD yesterday am , has another HD treatment in am   Review of systems:  Severe aphasia    Objective:   No results found. Recent Labs    08/23/24 0540  WBC 8.4  HGB 10.2*  HCT 30.1*  PLT 199    Recent Labs    08/23/24 0540  NA 128*  K 6.6*  CL 88*  CO2 28  GLUCOSE 107*  BUN 50*  CREATININE 9.20*  CALCIUM  7.5*     Intake/Output Summary (Last 24 hours) at 08/24/2024 0708 Last data filed at 08/23/2024 1430 Gross per 24 hour  Intake 240 ml  Output 1000 ml  Net -760 ml        Physical Exam: Vital Signs Blood pressure (!) 141/74, pulse 61, temperature 98.3 F (36.8 C), temperature source Oral, resp. rate 18, height 5' 2 (1.575 m), weight 68.3 kg, SpO2 (!) 89%.   General: No acute distress.  Laying in bed, sleeping. 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  No injection or notable discharge. Heart: Regular rate and rhythm no rubs murmurs or extra sounds Lungs: Clear to auscultation, breathing unlabored, no rales or wheezes Abdomen: Positive bowel sounds, soft nontender to palpation, nondistended Extremities: No clubbing, cyanosis, or edema Skin: No evidence of breakdown, no evidence of rash Neuro: Awake alert unable to follow commands without gestural cues due to receptive aphasia and apraxia , moving all 4 limbs  Sensory exam: cannot assess due to apraxia Motor exam: 4 out of 5 right upper and lower extremity, 5 out of 5 left  upper and lower extremity.  Cranial nerves: Grossly intact. MSK- No RUE swelling , no pain with ROM , no discoloration   Assessment/Plan: 1. Functional deficits which require 3+ hours per day of interdisciplinary therapy in a comprehensive inpatient rehab setting. Physiatrist is providing close team supervision and 24 hour management of active medical problems listed  below. Physiatrist and rehab team continue to assess barriers to discharge/monitor patient progress toward functional and medical goals  Care Tool:  Bathing    Body parts bathed by patient: Chest, Abdomen, Front perineal area, Right upper leg, Left upper leg, Right lower leg, Left lower leg, Face, Right arm, Buttocks   Body parts bathed by helper: Left arm     Bathing assist Assist Level: Minimal Assistance - Patient > 75%     Upper Body Dressing/Undressing Upper body dressing   What is the patient wearing?: Pull over shirt    Upper body assist Assist Level: Supervision/Verbal cueing    Lower Body Dressing/Undressing Lower body dressing      What is the patient wearing?: Underwear/pull up, Pants     Lower body assist Assist for lower body dressing: Contact Guard/Touching assist     Toileting Toileting    Toileting assist Assist for toileting: Moderate Assistance - Patient 50 - 74%     Transfers Chair/bed transfer  Transfers assist     Chair/bed transfer assist level: Minimal Assistance - Patient > 75%     Locomotion Ambulation   Ambulation assist  Assist level: Minimal Assistance - Patient > 75% Assistive device: No Device Max distance: 188ft   Walk 10 feet activity   Assist     Assist level: Minimal Assistance - Patient > 75% Assistive device: No Device   Walk 50 feet activity   Assist    Assist level: Minimal Assistance - Patient > 75% Assistive device: No Device    Walk 150 feet activity   Assist Walk 150 feet activity did not occur: Safety/medical concerns (fatigue, weakness)         Walk 10 feet on uneven surface  activity   Assist Walk 10 feet on uneven surfaces activity did not occur: Safety/medical concerns (fatigue, weakness)         Wheelchair     Assist Is the patient using a wheelchair?: Yes Type of Wheelchair: Manual    Wheelchair assist level: Dependent - Patient 0% Max wheelchair distance:  >326ft    Wheelchair 50 feet with 2 turns activity    Assist        Assist Level: Dependent - Patient 0%   Wheelchair 150 feet activity     Assist      Assist Level: Dependent - Patient 0%   Blood pressure (!) 141/74, pulse 61, temperature 98.3 F (36.8 C), temperature source Oral, resp. rate 18, height 5' 2 (1.575 m), weight 68.3 kg, SpO2 (!) 89%.  Medical Problem List and Plan: 1. Functional deficits secondary to Left MCA infarct             -patient may  shower             -ELOS/Goals: 1/24 , Sup-MinA goals,per wife able to leave early to avoid snow ,  Due to lack of insurance will not have therapy post d/c, perhaps pt may be able to go to [facility] aphasia program  Will need f/u PCP, Nephro,  PMR and Neuro   2.  Antithrombotics: -DVT/anticoagulation:  Mechanical: Sequential compression devices, below knee Bilateral lower extremities Pharmaceutical: Heparin              -antiplatelet therapy: Aspirin  and Plavix  for 3 months, then followed by aspirin  alone.   3. Pain Management: Tylenol  prn   1-17: Add gabapentin  100 mg 3 times daily for left facial sensitivity/pain; may be more related to glaucoma as below although seems to separate processes on exam  1-18: No complaints overnight, continue current regimen.   4. Mood/Behavior/Sleep: LCSW to follow for evaluation and support when available.              -antipsychotic agents: n/a             -sleep: Trazadone prn    5. Neuropsych/cognition: This patient is not capable of making decisions on his  own behalf.   6. Skin/Wound Care: Routine pressure relief measures.    7. Fluids/Electrolytes/Nutrition: Monitor I&O and weight. Follow up labs monitored by HD.    8.  Acute CVA: Continue DAPT aspirin  and Plavix  for 3 months, then followed by aspirin  alone.  Continue Crestor  and Zetia .  Follow-up outpatient with cardiology for heart monitor.   9.  ESRD: On HD TTS, nephrology following.   10.  Chronic HFpEF: Continue  fluid removal per HD.    - No signs of volume overload Filed Weights   08/23/24 0801 08/23/24 1222 08/23/24 1227  Weight: 68 kg 68.3 kg 68.3 kg    11.  Type 2 diabetes: A1c 8.5, poorly controlled.  Used glipizide  10 mg at home. -  Monitor glucose ACHS with SSI NovoLog . CBG (last 3)  Recent Labs    08/23/24 1623 08/23/24 2120 08/24/24 0619  GLUCAP 193* 181* 150*  Am CBG in desired range Start low dose glipizide  to control pm CBGs 1-17: Some intermittent highs, but fairly well-controlled.  Monitor.   12.  Anemia of chronic disease: Hemoglobin 10.7.  Monitor CBC.       Latest Ref Rng & Units 08/23/2024    5:40 AM 08/18/2024   11:13 AM 08/17/2024    5:27 AM  CBC  WBC 4.0 - 10.5 K/uL 8.4  8.0  9.8   Hemoglobin 13.0 - 17.0 g/dL 89.7  88.4  88.8   Hematocrit 39.0 - 52.0 % 30.1  34.1  33.8   Platelets 150 - 400 K/uL 199  198  196     13.  Hyperlipidemia/CAD: Continue statin, zetia , aspirin  and plavix .    14.  Dental pain: Received IV Unasyn  transition Completed Augmentin  x 4 days f/u dental as OP    1-17: Denies dental pain on exam  15.  Left eye pressure/pain.  Likely secondary to chronic glaucoma.  Gets injections every 2 to 3 months, missed the last injection in October.  Has retinopathy r/t diabetes    - 1-17: Contacted Dr. Austin with ophthalmology, recommended medication management only at this time, appreciate her assistance.  Ultimately, got a hold of patient's outpatient ophthalmology practice with [facility], Dr. Nonah recommended addition of Cosopt  and brimonidine  0.2% eyedrops to the left eye twice daily; added.  Current visual acuity seems to be at his baseline.  Will need to follow-up with their office to resume injections immediately after discharge. 1-18: No further complaints of eye pain per nursing; they know to notify provider if this recurs. Overall difficult to get accurate hx, on exam has left trap pain , also reported by PT, may have some radiating discomfort to  head, but otherwise exam neg LOS: 10 days A FACE TO FACE EVALUATION WAS PERFORMED  Blake Santana 08/24/2024, 7:08 AM     "

## 2024-08-24 NOTE — Progress Notes (Signed)
 Physical Therapy Session Note  Patient Details  Name: Blake Santana MRN: [id number] Date of Birth: [birth year]  Today's Date: 08/24/2024 PT Individual Time: 9096-9056 PT Individual Time Calculation (min): 40 min   Short Term Goals: Week 1:  PT Short Term Goal 1 (Week 1): STG=LTG due to LOS  SESSION 1 Skilled Therapeutic Interventions/Progress Updates: Patient semi-reclined in bed on entrance to room. Patient alert and agreeable to PT session.   Patient reported unrated pain in B upper traps/shoulder region with occasional 6-//10 grimace in face (Wong-Baker)  Therapeutic Activity: Bed Mobility: Pt performed supine<>sit on EOB independently. Transfers: Pt performed sit<>stand transfers throughout session with supervision no AD with supervision. Provided VC for pt to increase safety awareness to ensure back of knees touch sitting surface.  Neuromuscular Re-ed: NMR facilitated during session with focus on dynamic standing balance. - Standing on airex pad with tidal tank in B UE and pt performing shoulder flexion while maintaining balance, then moved to statically holding tank in B UE. Pt required max multimodal cuing (due to aphasia) to hold foot placement on pad vs stepping too close to edge of pad. Min/modA to prevent LOB required - Pt ambulating around main gym and hallway with tidal tank in B UE and max cuing required to keep water level. PTA also provided light perturbations throughout and pt required overall supervision with 2 rounds performed  NMR performed for improvements in motor control and coordination, balance, sequencing, judgement, and self confidence/ efficacy in performing all aspects of mobility at highest level of independence.   Manual Therapy: Palpation of L upper trap performed with trigger points noted. Education and rationale provided with pt agreeing to participate in intervention. - Trigger point release to stated area with soft tissue mobilization to  follow throughout. Pt with hot pack donned 5 min prior to manual therapy to relax musculature   Patient supine in bed at end of session with brakes locked, wife present and all needs within reach.   SESSION 2 Skilled Therapeutic Interventions/Progress Updates: Patient semi-reclined in bed with wife and son and interpretor present on entrance to room. Patient alert and agreeable to PT session.   Patient reported no pain. Nsg was initially going to donn 2L O2 due to reports of pt SpO2 between 89-91 at rest (pt denied symptoms). Attending physician stated to use prn. Pt son present with PTA updating son on pt's CLOF and that pt will need close supervision when ambulating due to poor visual deficit, decreased safety awareness and overall balance. PTA provided pt and pt wife with pro bono PT's in the area  Therapeutic Activity: Bed Mobility: Pt performed supine<sit on EOB independently.  Transfers: Pt performed sit<>stand transfers throughout session with supervision no AD. Pt doffed B grip socks and donned personal socks and shoes with supervision for safety sitting EOB. Pt wife had to assist pt with tying L shoe due to some apraxia noted to be present.  Neuromuscular Re-ed: NMR facilitated during session with focus on dynamic standing balance. Pt ambulating around main gym up to day room and back around main gym and back to room with tidal tank in B UE. PTA provided light perturbations throughout and pt required overall supervision.   NMR performed for improvements in motor control and coordination, balance, sequencing, judgement, and self confidence/ efficacy in performing all aspects of mobility at highest level of independence.   Manual Therapy: Palpation of B upper traps (L>R) performed with trigger point still present but able to be released  some following manual therapy each session as pt had high tension covering point. STM to cervical paraspinals performed as well. Education and rationale  provided with pt agreeing to participate in intervention. Pt performed stretch of lateral cervical flexors with cue to follow PTA movement. PTA provided passive stretch of B levator scapula. Pt noted to have improved ROM (not formally assessed) cervical rotation - Trigger point release to L upper trap with soft tissue mobilization to follow throughout rest of musculature noted above. Hot pack applied at beginning to relax musculature, and then at end to decrease soreness.   Patient sitting EOB at end of session with brakes locked, nsg and PA and wife present, and all needs within reach.       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

## 2024-08-24 NOTE — Progress Notes (Addendum)
 Patient difficult to obtain information this morning. Ongoing issue of aphasia. Initially denying pain or any issues this morning. Asked three times about pain. Observed favoring right arm. Asked about right arm and hand. Denied any pain. Denied any pain with neck or shoulder. During interaction reported sharp chest pain under the left breast area. Provider made aware and informed. Provider aware of explained patient having ongoing trap and neck pain issues. Later patient explained meant pain was in the back of his neck not his chest.

## 2024-08-24 NOTE — Progress Notes (Signed)
 Occupational Therapy Session Note  Patient Details  Name: Blake Santana MRN: [id number] Date of Birth: Dec 23, 1970  Today's Date: 08/24/2024 OT Individual Time: 8884-8788 OT Individual Time Calculation (min): 56 min    Short Term Goals: Week 1:  OT Short Term Goal 1 (Week 1): STGs = LTGs OT Short Term Goal 1 - Progress (Week 1): Progressing toward goal Week 2:  OT Short Term Goal 1 (Week 2): STGs = LTGs  Skilled Therapeutic Interventions/Progress Updates:      Pt seen for BADL retraining of toileting, bathing, and dressing with a focus on use of R hand and balance. Interpretor, wife, and eldest son present for family education.  See ADL documentation below. Excellent use of RUE and improved vision.  Pt able to step into shower and find seat, found door of room, and recognized plastic fruit.   Ambulated with CGA to kitchen and worked on naming fruit. He did will with fruit with 1, or 2 syllables, had more difficulty with 3+syllables.    Ambulated to gym so his son could see recommended exercises for home: standing with a gym ball between hands and working on torso twists and cross chops.  Son had questions about his vision which I answered to the best of what I have been able to evaluate in light of his aphasia.  Pt returned to room with all needs met.   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 Restrictions Weight Bearing Restrictions Per Provider Order: No    Vital Signs: Therapy Vitals BP: 132/65 Pain: Pain Assessment Pain Scale: Faces Pain Score: 0-No pain Faces Pain Scale: No hurt ADL: ADL Eating: Supervision/safety Grooming: Supervision/safety Where Assessed-Grooming: Standing at sink Upper Body Bathing: Supervision/safety (using sponge mit on R hand) Where Assessed-Upper Body Bathing: Shower Lower  Body Bathing: Supervision/safety Where Assessed-Lower Body Bathing: Shower Upper Body Dressing: Supervision/safety Where Assessed-Upper Body Dressing: Edge of bed Lower Body Dressing: Supervision/safety Where Assessed-Lower Body Dressing: Edge of bed Toileting: Supervision/safety Where Assessed-Toileting: Teacher, Adult Education: Furniture Conservator/restorer Method: Proofreader: Chiropractor Transfer: Scientific Laboratory Technician Method: Ship Broker: Acupuncturist: Administrator, Arts Method: Designer, Industrial/product: Grab bars    Therapy/Group: Individual Therapy  Lenoard Helbert 08/24/2024, 12:26 PM

## 2024-08-24 NOTE — Progress Notes (Signed)
 Speech Language Pathology Weekly Progress and Session Note  Patient Details  Name: Blake Santana MRN: [id number] Date of Birth: 1971-03-06  Beginning of progress report period: August 15, 2024 End of progress report period: August 24, 2024  Today's Date: 08/24/2024 SLP Individual Time: 9199-9154 SLP Individual Time Calculation (min): 45 min  Short Term Goals: Week 1: SLP Short Term Goal 1 (Week 1): STG=LTG due to ELOS SLP Short Term Goal 1 - Progress (Week 1): Progressing toward goal    New Short Term Goals: Week 2: SLP Short Term Goal 1 (Week 2): STG = LTG due to ELOS  Weekly Progress Updates: Pt has made fair gains this reporting period due to improved communication and swallowing. STG were not written at evaluation due to ELOS. Currently, patient continues to require max-total A for expressive/receptive language and is now consuming a D3/thin diet with medications whole in puree.  Pt/family eduction ongoing. Pt would benefit from continued ST intervention to maximize communication and dysphagia in order to maximize functional independence at d/c.    Intensity: Minumum of 1-2 x/day, 30 to 90 minutes Frequency: 3 to 5 out of 7 days Duration/Length of Stay: 1/24 Treatment/Interventions: Cueing hierarchy;Dysphagia/aspiration precaution training;Functional tasks;Internal/external aids;Multimodal communication approach;Patient/family education;Speech/Language facilitation;Therapeutic Activities   Daily Session  Skilled Therapeutic Interventions:  Skilled therapy session focused on communication goals. SLP facilitated session by targeting receptive language. Patient with appox 75% accuracy given modA for simple yes/no questions with use of colored communication board. SLP then targeted automatic speech tasks. Patient verbalized name with maxA and numbers 1-5 with minA in English. Patient sequenced days of the week with max-totalA, though unable to verbalize due to  persveration on first. During confrontational naming task, patient independently verbalized pen and water in Spanish. Patient unable to ID objects in FO4 this date. Patient left in bed with alarm set and call bell in reach. Continue POC.      Pain None visualized   Therapy/Group: Individual Therapy  Ayda Tancredi M.A., CCC-SLP 08/24/2024, 10:37 AM

## 2024-08-24 NOTE — Progress Notes (Signed)
 " [facility] Progress Note   Subjective:    Seen and examined patient at bedside. Patient's family and Interpreter also at bedside. Tolerated yesterday's HD with net UF 1L. He denies SOB, CP, and N/V. Next HD 1/23.  Objective Vitals:   08/24/24 0618 08/24/24 0827 08/24/24 1429 08/24/24 1445  BP: (!) 141/74 132/65 123/62   Pulse: 61  62   Resp: 18  12   Temp: 98.3 F (36.8 C)  97.6 F (36.4 C)   TempSrc: Oral  Oral   SpO2: (!) 89%  91% 97%  Weight:      Height:       Physical Exam General: Awake, alert, mildly frail, NAD Heart: S1 and S2; No murmurs, gallops, or rubs Lungs: Clear throughout Abdomen: Soft and non-tender Extremities: No LE edema Dialysis Access: AVF  Filed Weights   08/23/24 0801 08/23/24 1222 08/23/24 1227  Weight: 68 kg 68.3 kg 68.3 kg    Intake/Output Summary (Last 24 hours) at 08/24/2024 1600 Last data filed at 08/24/2024 1000 Gross per 24 hour  Intake 240 ml  Output --  Net 240 ml    Additional Objective Labs: Basic Metabolic Panel: Recent Labs  Lab 08/18/24 1113 08/23/24 0540 08/24/24 0955  NA 129* 128* 130*  K 4.9 6.6* 4.9  CL 86* 88* 89*  CO2 26 28 27   GLUCOSE 178* 107* 165*  BUN 46* 50* 32*  CREATININE 9.48* 9.20* 7.28*  CALCIUM  7.6* 7.5* 7.9*  PHOS 6.7* 5.4* 4.7*   Liver Function Tests: Recent Labs  Lab 08/18/24 1113 08/23/24 0540 08/24/24 0955  ALBUMIN 3.9 3.6 3.6   No results for input(s): LIPASE, AMYLASE in the last 168 hours. CBC: Recent Labs  Lab 08/18/24 1113 08/23/24 0540  WBC 8.0 8.4  NEUTROABS  --  4.4  HGB 11.5* 10.2*  HCT 34.1* 30.1*  MCV 92.4 94.4  PLT 198 199   Blood Culture    Component Value Date/Time   SDES BLOOD RIGHT FOREARM 03/29/2020 2250   SPECREQUEST  03/29/2020 2250    BOTTLES DRAWN AEROBIC AND ANAEROBIC Blood Culture adequate volume   CULT  03/29/2020 2250    NO GROWTH 5 DAYS Performed at [facility], [address] [place], [place] [address]     REPTSTATUS 04/03/2020 FINAL 03/29/2020 2250    Cardiac Enzymes: No results for input(s): CKTOTAL, CKMB, CKMBINDEX, TROPONINI in the last 168 hours. CBG: Recent Labs  Lab 08/23/24 1313 08/23/24 1623 08/23/24 2120 08/24/24 0619 08/24/24 1218  GLUCAP 83 193* 181* 150* 119*   Iron  Studies: No results for input(s): IRON , TIBC, TRANSFERRIN, FERRITIN in the last 72 hours. Lab Results  Component Value Date   INR 1.0 08/06/2024   INR 1.1 11/17/2021   INR 1.26 09/02/2018   Studies/Results: No results found.  Medications:   amLODipine   10 mg Oral Daily   brimonidine   1 drop Left Eye BID   calcium  acetate  667 mg Oral TID WC   carvedilol   12.5 mg Oral BID WC   Chlorhexidine  Gluconate Cloth  6 each Topical Q0600   clopidogrel   75 mg Oral Daily   dorzolamide -timolol   1 drop Left Eye BID   doxercalciferol   2 mcg Intravenous Q M,W,F-HD   ezetimibe   10 mg Oral Daily   gabapentin   100 mg Oral TID   glipiZIDE   2.5 mg Oral QAC breakfast   heparin   5,000 Units Subcutaneous Q8H   heparin  sodium (porcine)  2,500 Units Intravenous Once  insulin  aspart  0-5 Units Subcutaneous QHS   insulin  aspart  0-6 Units Subcutaneous TID WC   rosuvastatin   10 mg Oral Daily    Dialysis Orders: [facility] M[facility] 3h B350 65.2kg  AVF  Hep 2000  Last OP HD 1/02, post wt 67.2kg Hep B SAg neg 07/10/24 Heb B SAb +Immune 05/15/24  Assessment/Plan: ESRD - on HD MWF, on HD -Informed of HD machine clotting, will start heparin  boluses with HD -Current K+ now stable   Acute CVA  - L posterior MCA territory per MRI - aphasia - had CVA extension  -per primary, in CIR   HTN/Volume - on norvasc  10mg  daily and Coreg  12.5mg  BID - per neuro -> try to maintain SBP 130-150 given L M2 occlusion -discussed with Neurology again today. Maintaining SBP 130-150 during HD may be challenging and will do our best. Watch closely for any neurological changes -will UF as tolerated   Anemia of esrd - Hb  10.2, follow, resume ESA for Hgb < 10    Secondary hyperparathyroidism -Continue phoslo  1 tab tidac, phos now at goal -Corr Ca 7.8, resuming Hectorol    Dispo: in CIR  Charmaine Piety, NP [facility] 08/24/2024,4:00 PM  LOS: 10 days    "

## 2024-08-25 ENCOUNTER — Other Ambulatory Visit (HOSPITAL_COMMUNITY): Payer: Self-pay

## 2024-08-25 ENCOUNTER — Encounter (HOSPITAL_COMMUNITY): Payer: Self-pay

## 2024-08-25 LAB — BASIC METABOLIC PANEL WITH GFR
Anion gap: 11 (ref 5–15)
BUN: 21 mg/dL — ABNORMAL HIGH (ref 6–20)
CO2: 29 mmol/L (ref 22–32)
Calcium: 8 mg/dL — ABNORMAL LOW (ref 8.9–10.3)
Chloride: 90 mmol/L — ABNORMAL LOW (ref 98–111)
Creatinine, Ser: 5.43 mg/dL — ABNORMAL HIGH (ref 0.61–1.24)
GFR, Estimated: 12 mL/min — ABNORMAL LOW
Glucose, Bld: 255 mg/dL — ABNORMAL HIGH (ref 70–99)
Potassium: 5.2 mmol/L — ABNORMAL HIGH (ref 3.5–5.1)
Sodium: 129 mmol/L — ABNORMAL LOW (ref 135–145)

## 2024-08-25 LAB — CBC WITH DIFFERENTIAL/PLATELET
Abs Immature Granulocytes: 0.05 K/uL (ref 0.00–0.07)
Basophils Absolute: 0.1 K/uL (ref 0.0–0.1)
Basophils Relative: 1 %
Eosinophils Absolute: 0.8 K/uL — ABNORMAL HIGH (ref 0.0–0.5)
Eosinophils Relative: 9 %
HCT: 30.4 % — ABNORMAL LOW (ref 39.0–52.0)
Hemoglobin: 10.1 g/dL — ABNORMAL LOW (ref 13.0–17.0)
Immature Granulocytes: 1 %
Lymphocytes Relative: 25 %
Lymphs Abs: 2.1 K/uL (ref 0.7–4.0)
MCH: 31.6 pg (ref 26.0–34.0)
MCHC: 33.2 g/dL (ref 30.0–36.0)
MCV: 95 fL (ref 80.0–100.0)
Monocytes Absolute: 0.7 K/uL (ref 0.1–1.0)
Monocytes Relative: 9 %
Neutro Abs: 4.7 K/uL (ref 1.7–7.7)
Neutrophils Relative %: 55 %
Platelets: 182 K/uL (ref 150–400)
RBC: 3.2 MIL/uL — ABNORMAL LOW (ref 4.22–5.81)
RDW: 12.9 % (ref 11.5–15.5)
WBC: 8.4 K/uL (ref 4.0–10.5)
nRBC: 0 % (ref 0.0–0.2)

## 2024-08-25 LAB — GLUCOSE, CAPILLARY
Glucose-Capillary: 92 mg/dL (ref 70–99)
Glucose-Capillary: 116 mg/dL — ABNORMAL HIGH (ref 70–99)
Glucose-Capillary: 131 mg/dL — ABNORMAL HIGH (ref 70–99)
Glucose-Capillary: 230 mg/dL — ABNORMAL HIGH (ref 70–99)

## 2024-08-25 LAB — PHOSPHORUS: Phosphorus: 5 mg/dL — ABNORMAL HIGH (ref 2.5–4.6)

## 2024-08-25 MED ORDER — POLYVINYL ALCOHOL 1.4 % OP SOLN
2.0000 [drp] | OPHTHALMIC | 0 refills | Status: AC | PRN
  Filled 2024-08-25: qty 15, fill #0

## 2024-08-25 MED ORDER — CALCITRIOL 0.5 MCG PO CAPS
3.0000 ug | ORAL_CAPSULE | Freq: Two times a day (BID) | ORAL | 0 refills | Status: AC
  Filled 2024-08-25: qty 102, 9d supply, fill #0

## 2024-08-25 MED ORDER — CARVEDILOL 12.5 MG PO TABS
12.5000 mg | ORAL_TABLET | Freq: Two times a day (BID) | ORAL | 0 refills | Status: AC
  Filled 2024-08-25: qty 60, 30d supply, fill #0

## 2024-08-25 MED ORDER — DORZOLAMIDE HCL-TIMOLOL MAL 2-0.5 % OP SOLN
1.0000 [drp] | Freq: Two times a day (BID) | OPHTHALMIC | 12 refills | Status: AC
  Filled 2024-08-25: qty 10, 30d supply, fill #0

## 2024-08-25 MED ORDER — CALCIUM ACETATE (PHOS BINDER) 667 MG PO CAPS
667.0000 mg | ORAL_CAPSULE | Freq: Three times a day (TID) | ORAL | 0 refills | Status: AC
  Filled 2024-08-25: qty 90, 30d supply, fill #0

## 2024-08-25 MED ORDER — EZETIMIBE 10 MG PO TABS
10.0000 mg | ORAL_TABLET | Freq: Every day | ORAL | 0 refills | Status: AC
  Filled 2024-08-25: qty 30, 30d supply, fill #0

## 2024-08-25 MED ORDER — DOXERCALCIFEROL 4 MCG/2ML IV SOLN
INTRAVENOUS | Status: AC
  Filled 2024-08-25: qty 2

## 2024-08-25 MED ORDER — CLOPIDOGREL BISULFATE 75 MG PO TABS
75.0000 mg | ORAL_TABLET | Freq: Every day | ORAL | 0 refills | Status: AC
  Filled 2024-08-25: qty 30, 30d supply, fill #0

## 2024-08-25 MED ORDER — BRIMONIDINE TARTRATE 0.2 % OP SOLN
1.0000 [drp] | Freq: Two times a day (BID) | OPHTHALMIC | 12 refills | Status: AC
  Filled 2024-08-25: qty 5, 30d supply, fill #0

## 2024-08-25 MED ORDER — GABAPENTIN 100 MG PO CAPS
100.0000 mg | ORAL_CAPSULE | Freq: Three times a day (TID) | ORAL | 0 refills | Status: AC
  Filled 2024-08-25: qty 90, 30d supply, fill #0

## 2024-08-25 MED ORDER — ROSUVASTATIN CALCIUM 10 MG PO TABS
10.0000 mg | ORAL_TABLET | Freq: Every day | ORAL | 0 refills | Status: AC
  Filled 2024-08-25: qty 30, 30d supply, fill #0

## 2024-08-25 MED ORDER — AMLODIPINE BESYLATE 10 MG PO TABS
10.0000 mg | ORAL_TABLET | Freq: Every day | ORAL | 0 refills | Status: AC
  Filled 2024-08-25: qty 30, 30d supply, fill #0

## 2024-08-25 MED ORDER — GLIPIZIDE 5 MG PO TABS
2.5000 mg | ORAL_TABLET | Freq: Every day | ORAL | 0 refills | Status: AC
  Filled 2024-08-25: qty 15, 30d supply, fill #0

## 2024-08-25 NOTE — Plan of Care (Signed)
 Pt had dynamic stand balance, toilet and tub transfer goals set at supervision but he continues to need CGA which his wife is able to provide the A for.  Problem: RH Balance Goal: LTG: Patient will maintain dynamic sitting balance (OT) Description: LTG:  Patient will maintain dynamic sitting balance with assistance during activities of daily living (OT) Outcome: Completed/Met Goal: LTG Patient will maintain dynamic standing with ADLs (OT) Description: LTG:  Patient will maintain dynamic standing balance with assist during activities of daily living (OT)  Outcome: Adequate for Discharge   Problem: Sit to Stand Goal: LTG:  Patient will perform sit to stand in prep for activites of daily living with assistance level (OT) Description: LTG:  Patient will perform sit to stand in prep for activites of daily living with assistance level (OT) Outcome: Completed/Met   Problem: RH Eating Goal: LTG Patient will perform eating w/assist, cues/equip (OT) Description: LTG: Patient will perform eating with assist, with/without cues using equipment (OT) Outcome: Completed/Met   Problem: RH Grooming Goal: LTG Patient will perform grooming w/assist,cues/equip (OT) Description: LTG: Patient will perform grooming with assist, with/without cues using equipment (OT) Outcome: Completed/Met   Problem: RH Bathing Goal: LTG Patient will bathe all body parts with assist levels (OT) Description: LTG: Patient will bathe all body parts with assist levels (OT) Outcome: Completed/Met   Problem: RH Dressing Goal: LTG Patient will perform upper body dressing (OT) Description: LTG Patient will perform upper body dressing with assist, with/without cues (OT). Outcome: Completed/Met Goal: LTG Patient will perform lower body dressing w/assist (OT) Description: LTG: Patient will perform lower body dressing with assist, with/without cues in positioning using equipment (OT) Outcome: Completed/Met   Problem: RH  Toileting Goal: LTG Patient will perform toileting task (3/3 steps) with assistance level (OT) Description: LTG: Patient will perform toileting task (3/3 steps) with assistance level (OT)  Outcome: Completed/Met   Problem: RH Functional Use of Upper Extremity Goal: LTG Patient will use RT/LT upper extremity as a (OT) Description: LTG: Patient will use right/left upper extremity as a stabilizer/gross assist/diminished/nondominant/dominant level with assist, with/without cues during functional activity (OT) Outcome: Completed/Met   Problem: RH Toilet Transfers Goal: LTG Patient will perform toilet transfers w/assist (OT) Description: LTG: Patient will perform toilet transfers with assist, with/without cues using equipment (OT) Outcome: Adequate for Discharge   Problem: RH Tub/Shower Transfers Goal: LTG Patient will perform tub/shower transfers w/assist (OT) Description: LTG: Patient will perform tub/shower transfers with assist, with/without cues using equipment (OT) Outcome: Adequate for Discharge

## 2024-08-25 NOTE — Progress Notes (Signed)
 Occupational Therapy Session Note  Patient Details  Name: Blake Santana MRN: [id number] Date of Birth: 1970-11-15  Today's Date: 08/25/2024 OT Individual Time: 8891-8794 OT Individual Time Calculation (min): 57 min    Short Term Goals: Week 1:  OT Short Term Goal 1 (Week 1): STGs = LTGs OT Short Term Goal 1 - Progress (Week 1): Progressing toward goal Week 2:  OT Short Term Goal 1 (Week 2): STGs = LTGs  Skilled Therapeutic Interventions/Progress Updates:    Pt received in bed. Interpretor present. Pt did want to shower today. See ADL documentation below. He continues to need CGA with his transfers due instability with leaning to R and R side incoordination.  Pt ambulated to gym with CGA. Reviewed his HEP and pt provided with exercise sheet to show his wife. Pt practiced the exercises.   Worked on R hand FMC with squeezing red clothespins to put on a target with min cues.  Worked at [employer] to see if pt could identify targets on the screen. He was able to find the targets with extra time and additional head turns.  Reassessed visual tracking. Pt able to track with B and each eye individually.  The chart said L eye blind but he does have some vision in that eye.  Pt returned to room with all needs met.  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 Restrictions Weight Bearing Restrictions Per Provider Order: No  Vital Signs: Therapy Vitals Pulse Rate: 63 BP: (!) 148/66 Pain: Pain Assessment Pain Scale: Faces Pain Score: 0-No pain Faces Pain Scale: Hurts a little bit Pain Location: Neck Pain Intervention(s): Medication (See eMAR) ADL: ADL Eating: Set up Grooming: Supervision/safety Where Assessed-Grooming: Standing at sink Upper Body Bathing: Supervision/safety (using sponge mit on R hand) Where Assessed-Upper  Body Bathing: Shower Lower Body Bathing: Supervision/safety Where Assessed-Lower Body Bathing: Shower Upper Body Dressing: Supervision/safety Where Assessed-Upper Body Dressing: Edge of bed Lower Body Dressing: Supervision/safety Where Assessed-Lower Body Dressing: Edge of bed Toileting: Supervision/safety Where Assessed-Toileting: Teacher, Adult Education: Furniture Conservator/restorer Method: Proofreader: Chiropractor Transfer: Scientific Laboratory Technician Method: Ship Broker: Acupuncturist: Administrator, Arts Method: Designer, Industrial/product: Grab bars      Therapy/Group: Individual Therapy  Vere Diantonio 08/25/2024, 12:54 PM

## 2024-08-25 NOTE — Progress Notes (Signed)
 Occupational Therapy Discharge Summary  Patient Details  Name: Blake Santana MRN: [id number] Date of Birth: 05-05-1971  Date of Discharge from OT service:August 25, 2024    Patient has met 9 of 12 long term goals due to improved activity tolerance, improved balance, postural control, ability to compensate for deficits, functional use of  RIGHT upper and RIGHT lower extremity, improved attention, improved awareness, and improved coordination.  Patient to discharge at overall Supervision level  with self care and CGA with transfers and standing balance.  Pt has made strong progress as he initially needed mod A with self care. Patient's care partner is independent to provide the necessary physical and cognitive assistance at discharge.  Family education completed with wife and son.  Reasons goals not met: supervision goals set for toilet and tub transfers and dynamic standing balance but pt continues to need CGA  Recommendation:  Patient will benefit from ongoing skilled OT services in outpatient setting to continue to advance functional skills in the area of BADL and iADL, but due to no insurance he does not qualify. Family has been given information about pro bono clinics.    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 Restrictions Weight Bearing Restrictions Per Provider Order: No   ADL ADL Eating: Set up Grooming: Supervision/safety Where Assessed-Grooming: Standing at sink Upper Body Bathing: Supervision/safety (using sponge mit on R hand) Where Assessed-Upper Body Bathing: Shower Lower Body Bathing: Supervision/safety Where Assessed-Lower Body Bathing: Shower Upper Body Dressing:  Supervision/safety Where Assessed-Upper Body Dressing: Edge of bed Lower Body Dressing: Supervision/safety Where Assessed-Lower Body Dressing: Edge of bed Toileting: Supervision/safety Where Assessed-Toileting: Teacher, Adult Education: Furniture Conservator/restorer Method: Proofreader: Engineer, Technical Sales: Scientific Laboratory Technician Method: Ship Broker: Acupuncturist: Administrator, Arts Method: Designer, Industrial/product: Engineer, materials Vision Baseline Vision/History: 4 Teacher, Early Years/pre blind Patient Visual Report: Blurring of vision Vision Assessment?: Yes Eye Alignment: Within Functional Limits Ocular Range of Motion: Within Functional Limits Tracking/Visual Pursuits: Able to track stimulus in all quads without difficulty Visual Fields: Other (comment) (unable to accurately assess due to aphasia) Perception  Perception: Impaired Perception-Other Comments: R side inattention with ambulation but now has functional attention to R arm in ADLs Praxis Praxis Impairment Details: Motor planning;Ideomotor Cognition Cognition Overall Cognitive Status: Difficult to assess (due to global aphasia) Arousal/Alertness: Awake/alert Awareness: Impaired Awareness Impairment: Intellectual impairment Problem Solving: Impaired Safety/Judgment: Impaired Comments: Difficult to assess due to global aphasia but able to follow 1 step commands Brief Interview for Mental Status (BIMS) Repetition of Three Words (First Attempt): No answer (pt unable to form words due to aphasia) Temporal Orientation: Year: No answer Temporal Orientation: Month: No answer Temporal Orientation: Day: No answer Recall: Sock: No answer Recall: Blue: No answer Recall: Bed: No answer BIMS Summary Score: 99 Sensation Sensation Light Touch: Impaired by gross assessment Peripheral sensation comments: difficult  to assess Light Touch Impaired Details: Impaired RUE;Impaired RLE Hot/Cold: Appears Intact Proprioception: Impaired by gross assessment Stereognosis: Impaired by gross assessment Additional Comments: difficult to assess due to apraxia, aphaisa, and impaired sequencing Coordination Gross Motor Movements are Fluid and Coordinated: No Fine Motor Movements are Fluid and Coordinated: No Coordination and Movement Description: grossly uncoordinated due to hemiparesis, decreased balance/coordination, aphasia,  apraxia, and impaired motor planning/sequencing Motor  Motor Motor: Motor apraxia;Other (comment) (hemiparesis) Motor - Discharge Observations: hemiparesis, decreased balance/coordination, aphasia, apraxia, and impaired motor planning/sequencing but has improved since evaluation Mobility  Bed Mobility Bed Mobility: Rolling Right;Rolling Left;Sit to Supine Rolling Right: Independent Rolling Left: Independent Sit to Supine: Independent Transfers Sit to Stand: Supervision/Verbal cueing Stand to Sit: Supervision/Verbal cueing  Trunk/Postural Assessment  Cervical Assessment Cervical Assessment: Within Functional Limits Thoracic Assessment Thoracic Assessment: Within Functional Limits Lumbar Assessment Lumbar Assessment: Within Functional Limits Postural Control Righting Reactions: delayed but has improved since evaluation Protective Responses: delayed but has improved since evaluation  Balance Balance Balance Assessed: Yes Standardized Balance Assessment Standardized Balance Assessment: Timed Up and Go Test Timed Up and Go Test TUG: Normal TUG Normal TUG (seconds): 11.26 Static Sitting Balance Static Sitting - Balance Support: Feet supported;Bilateral upper extremity supported Static Sitting - Level of Assistance: 7: Independent Dynamic Sitting Balance Dynamic Sitting - Balance Support: Feet supported Dynamic Sitting - Level of Assistance: 5: Stand by assistance Static Standing  Balance Static Standing - Balance Support: No upper extremity supported Static Standing - Level of Assistance: 5: Stand by assistance Dynamic Standing Balance Dynamic Standing - Balance Support: During functional activity;No upper extremity supported Dynamic Standing - Level of Assistance: 5: Stand by assistance Dynamic Standing - Comments: with transfers Extremity/Trunk Assessment RUE Assessment Active Range of Motion (AROM) Comments: [facility] General Strength Comments: shoulder 4-/5,  grasp 4-/5 LUE Assessment LUE Assessment: Within Functional Limits   Henlee Donovan 08/25/2024, 1:01 PM
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
## 2024-08-25 NOTE — Progress Notes (Signed)
 Physical Therapy Discharge Summary  Patient Details  Name: Blake Santana MRN: [id number] Date of Birth: 26-Jun-1971  Date of Discharge from PT service:August 25, 2024  Patient has met 6 of 8 long term goals due to improved activity tolerance, improved balance, increased strength, decreased pain, improved attention, improved awareness, and improved coordination.  Patient to discharge at an ambulatory level Supervision for transfers and CGA for ambulation.  Patient's care partner is independent to provide the necessary physical and cognitive assistance at discharge.  Reasons goals not met: Pt requires CGA to ambulate safely due to poor visual deficits (chronic) and decreased dynamic standing balance.   Recommendation:  Patient will benefit from ongoing skilled PT services in outpatient setting [facility] (uninsured) to continue to advance safe functional mobility, address ongoing impairments in below listed deficits, and minimize fall risk.  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 Unable to answer (global aphasia) Pain Interference with Therapy Activities: 8. Unable to answer (global aphasia) Pain Interference with Day-to-Day Activities: 8. Unable to answer (global aphasia) Vision/Perception  Vision - History Ability to See in Adequate Light: 4 Severely impaired Vision - Assessment Eye Alignment: Within Functional Limits Ocular Range of Motion: Within Functional Limits Tracking/Visual Pursuits: Able to track  stimulus in all quads without difficulty Perception Perception: Impaired Preception Impairment Details: Inattention/Neglect;Spatial orientation Perception-Other Comments: R side inattention with ambulation but now has functional attention to R arm in ADLs Praxis Praxis Impairment Details: Motor planning;Ideomotor  Cognition Overall Cognitive Status: Difficult to assess (due to global aphasia) Arousal/Alertness: Awake/alert Orientation Level: Oriented to person Awareness: Impaired Awareness Impairment: Intellectual impairment Problem Solving: Impaired Safety/Judgment: Impaired Comments: Difficult to assess due to global aphasia but able to follow 1 step commands Sensation Sensation Light Touch: Impaired by gross assessment Peripheral sensation comments: difficult to assess due to global aphasia Light Touch Impaired Details: Impaired RUE;Impaired RLE Hot/Cold: Appears Intact Proprioception: Impaired by gross assessment Stereognosis: Impaired by gross assessment Additional Comments: difficult to assess due to apraxia, aphaisa, and impaired sequencing Coordination Gross Motor Movements are Fluid and Coordinated: No Fine Motor Movements are Fluid and Coordinated: No Coordination and Movement Description: grossly uncoordinated due to hemiparesis, decreased balance/coordination, aphasia, apraxia, and impaired motor planning/sequencing Motor  Motor Motor: Motor apraxia;Other (comment) (hemiparesis) Motor - Discharge Observations: hemiparesis, decreased balance/coordination, aphasia, apraxia, and impaired motor planning/sequencing but has improved since evaluation  Mobility Bed Mobility Bed Mobility: Rolling Right;Rolling Left;Sit to Supine Rolling Right: Independent Rolling Left: Independent Sit to Supine: Independent Transfers Transfers: Sit to Stand;Stand to Sit;Stand Pivot Transfers Sit to Stand: Supervision/Verbal cueing Stand to Sit: Supervision/Verbal cueing Stand Pivot  Transfers: Supervision/Verbal cueing Stand Pivot Transfer Details: Verbal cues for precautions/safety Transfer (Assistive device): None Locomotion  Gait Ambulation: Yes Gait Assistance: Contact Guard/Touching assist Gait Distance (Feet): 150 Feet Assistive device: None Gait Assistance Details: Verbal cues for precautions/safety Gait Gait: Yes Gait Pattern: Impaired Gait Pattern: Poor foot clearance - left;Poor foot clearance - right;Step-through pattern Stairs / Additional Locomotion Stairs: Yes Stairs Assistance: Contact Guard/Touching assist Stair Management Technique: Two rails;Alternating pattern;Step to pattern Number of Stairs: 12 Height of Stairs: 6 Wheelchair Mobility Wheelchair Mobility: No  Trunk/Postural Assessment  Cervical Assessment Cervical Assessment: Within Functional Limits Thoracic Assessment Thoracic Assessment: Within Functional Limits Lumbar Assessment Lumbar Assessment: Within Functional Limits Postural Control Righting Reactions: delayed but has improved since evaluation Protective Responses: delayed but has improved since evaluation  Balance Balance Balance Assessed: Yes Standardized Balance Assessment Standardized Balance Assessment: Timed Up and Go Test Timed Up and Go Test TUG: Normal TUG Normal TUG (seconds): 11.26 Static Sitting Balance Static Sitting - Balance Support: Feet supported;Bilateral upper extremity supported Static Sitting - Level of Assistance: 7: Independent Dynamic Sitting Balance Dynamic Sitting - Balance Support: Feet supported Dynamic Sitting - Level of Assistance: 5: Stand by assistance Static Standing Balance Static Standing - Balance Support: No upper extremity supported Static Standing - Level of Assistance: 5: Stand by assistance Dynamic Standing Balance Dynamic Standing - Balance Support: During functional activity;No upper extremity supported Dynamic Standing - Level of Assistance: 5: Stand by  assistance Dynamic Standing - Comments: with transfers Extremity Assessment  RLE Assessment RLE Assessment: Within Functional Limits LLE Assessment LLE Assessment: Within Functional Limits   Dominic Sandoval PTA   08/25/2024, 9:19 AM

## 2024-08-25 NOTE — Discharge Planning (Addendum)
 [facility] Patient Discharge Orders - [facility] CLINIC: [facility] (ADDENDUM ON D/C DATE)  Patient's name: Blake Santana Admit/DC Dates: 08/06/2024 - 08/14/2024 - Inpatient [facility], then 08/14/2024 - 08/26/2024 CIR  DISCHARGE DIAGNOSES: Acute CVA L MCA territory Debility - s/p CIR  HTN urgency, improved  HD ORDER CHANGES: Heparin  change: no EDW Change: no  Bath Change: no  ANEMIA MANAGEMENT: Aranesp : Given: no    ESA dose for discharge: Mircera per protocol IV Iron  dose at discharge: Per protocol Transfusion: Given: no  BONE/MINERAL MEDICATIONS: Hectorol /Calcitriol  change: no Sensipar /Parsabiv change: no  ACCESS INTERVENTION/CHANGE: no Details:   RECENT LABS: Recent Labs  Lab 08/24/24 0955 08/25/24 0631  HGB  --  10.1*  NA 130*  --   K 4.9  --   CALCIUM  7.9*  --   PHOS 4.7* 5.0*  ALBUMIN 3.6  --     IV ANTIBIOTICS: no Details:  OTHER ANTICOAGULATION: no Details:  OTHER/APPTS/LAB ORDERS: - See initial [facility] d/c summary - will be on DAPT x 3 mo, then aspirin  alone indefinitely, on statin + zetia  - D/c home with family  D/C Meds to be reconciled by nurse after every discharge.  Completed By: Izetta Boehringer, PA-C [facility] Pager [phone]   Reviewed by: MD:______ RN_______

## 2024-08-25 NOTE — Progress Notes (Addendum)
 Received patient in bed to unit.  Alert and oriented.  Informed consent signed and in chart.   TX duration:3.hours  Patient tolerated well.  Transported back to the room  Alert, without acute distress.  Hand-off given to patient's nurse.   Access used: Left Fistula upper arm Access issues: none  Total UF removed: 1L Medication(s) given: hectoral  Camellia Brasil LPN [facility]

## 2024-08-25 NOTE — Progress Notes (Signed)
 "                                                        PROGRESS NOTE   Subjective/Complaints:  Elevated K+ normalized after HD, has another  tretment today  DIscussed MD f/u with wife using iPad interpreter service   Review of systems:  Severe aphasia    Objective:   No results found. Recent Labs    08/23/24 0540 08/25/24 0631  WBC 8.4 8.4  HGB 10.2* 10.1*  HCT 30.1* 30.4*  PLT 199 182    Recent Labs    08/23/24 0540 08/24/24 0955  NA 128* 130*  K 6.6* 4.9  CL 88* 89*  CO2 28 27  GLUCOSE 107* 165*  BUN 50* 32*  CREATININE 9.20* 7.28*  CALCIUM  7.5* 7.9*     Intake/Output Summary (Last 24 hours) at 08/25/2024 0755 Last data filed at 08/24/2024 2000 Gross per 24 hour  Intake 960 ml  Output 1 ml  Net 959 ml        Physical Exam: Vital Signs Blood pressure (!) 154/71, pulse 62, temperature 98.4 F (36.9 C), temperature source Oral, resp. rate 17, height 5' 2 (1.575 m), weight 69 kg, SpO2 93%.   General: No acute distress.  Laying in bed, sleeping. 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  No injection or notable discharge. Heart: Regular rate and rhythm no rubs murmurs or extra sounds Lungs: Clear to auscultation, breathing unlabored, no rales or wheezes Abdomen: Positive bowel sounds, soft nontender to palpation, nondistended Extremities: No clubbing, cyanosis, or edema Skin: No evidence of breakdown, no evidence of rash Neuro: Awake alert unable to follow commands without gestural cues due to receptive aphasia and apraxia , moving all 4 limbs  Sensory exam: cannot assess due to apraxia, aphaisa Motor exam: Limited by aphasia and apraxia grossly 4 out of 5 right upper and lower extremity, 5 out of 5 left  upper and lower extremity.  Cranial nerves: Grossly intact. MSK- No RUE swelling , no pain with ROM , no discoloration   Assessment/Plan: 1. Functional deficits which require 3+ hours per day of interdisciplinary  therapy in a comprehensive inpatient rehab setting. Physiatrist is providing close team supervision and 24 hour management of active medical problems listed below. Physiatrist and rehab team continue to assess barriers to discharge/monitor patient progress toward functional and medical goals  Care Tool:  Bathing    Body parts bathed by patient: Chest, Abdomen, Front perineal area, Right upper leg, Left upper leg, Right lower leg, Left lower leg, Face, Right arm, Buttocks   Body parts bathed by helper: Left arm     Bathing assist Assist Level: Minimal Assistance - Patient > 75%     Upper Body Dressing/Undressing Upper body dressing   What is the patient wearing?: Pull over shirt    Upper body assist Assist Level: Supervision/Verbal cueing    Lower Body Dressing/Undressing Lower body dressing      What is the patient wearing?: Underwear/pull up, Pants     Lower body assist Assist for lower body dressing: Contact Guard/Touching assist     Toileting Toileting    Toileting assist Assist for toileting: Moderate Assistance - Patient 50 - 74%     Transfers Chair/bed transfer  Transfers assist  Chair/bed transfer assist level: Minimal Assistance - Patient > 75%     Locomotion Ambulation   Ambulation assist      Assist level: Minimal Assistance - Patient > 75% Assistive device: No Device Max distance: 138ft   Walk 10 feet activity   Assist     Assist level: Minimal Assistance - Patient > 75% Assistive device: No Device   Walk 50 feet activity   Assist    Assist level: Minimal Assistance - Patient > 75% Assistive device: No Device    Walk 150 feet activity   Assist Walk 150 feet activity did not occur: Safety/medical concerns (fatigue, weakness)         Walk 10 feet on uneven surface  activity   Assist Walk 10 feet on uneven surfaces activity did not occur: Safety/medical concerns (fatigue, weakness)          Wheelchair     Assist Is the patient using a wheelchair?: Yes Type of Wheelchair: Manual    Wheelchair assist level: Dependent - Patient 0% Max wheelchair distance: >351ft    Wheelchair 50 feet with 2 turns activity    Assist        Assist Level: Dependent - Patient 0%   Wheelchair 150 feet activity     Assist      Assist Level: Dependent - Patient 0%   Blood pressure (!) 154/71, pulse 62, temperature 98.4 F (36.9 C), temperature source Oral, resp. rate 17, height 5' 2 (1.575 m), weight 69 kg, SpO2 93%.  Medical Problem List and Plan: 1. Functional deficits secondary to Left MCA infarct             -patient may  shower             -ELOS/Goals: 1/24 , Sup-MinA goals,per wife able to leave early to avoid snow ,  Due to lack of insurance will not have therapy post d/c, perhaps pt may be able to go to [facility] aphasia program  Will need f/u PCP, Nephro, Optho (retina specialist)  PMR and Neuro   2.  Antithrombotics: -DVT/anticoagulation:  Mechanical: Sequential compression devices, below knee Bilateral lower extremities Pharmaceutical: Heparin              -antiplatelet therapy: Aspirin  and Plavix  for 3 months, then followed by aspirin  alone.   3. Pain Management: Tylenol  prn   1-17: Add gabapentin  100 mg 3 times daily for left facial sensitivity/pain; may be more related to glaucoma as below although seems to separate processes on exam  1-18: No complaints overnight, continue current regimen.   4. Mood/Behavior/Sleep: LCSW to follow for evaluation and support when available.              -antipsychotic agents: n/a             -sleep: Trazadone prn    5. Neuropsych/cognition: This patient is not capable of making decisions on his  own behalf.   6. Skin/Wound Care: Routine pressure relief measures.    7. Fluids/Electrolytes/Nutrition: Monitor I&O and weight. Follow up labs monitored by HD.    8.  Acute CVA: Continue DAPT aspirin  and Plavix  for 3 months,  then followed by aspirin  alone.  Continue Crestor  and Zetia .  Follow-up outpatient with cardiology for heart monitor.   9.  ESRD: On HD TTS, nephrology following.   10.  Chronic HFpEF: Continue fluid removal per HD.    - No signs of volume overload Filed Weights   08/23/24 1222 08/23/24 1227 08/25/24 0500  Weight: 68.3 kg 68.3 kg 69 kg    11.  Type 2 diabetes: A1c 8.5, poorly controlled.  Used glipizide  10 mg at home. -Monitor glucose ACHS with SSI NovoLog . CBG (last 3)  Recent Labs    08/24/24 1710 08/24/24 2121 08/25/24 0631  GLUCAP 147* 194* 92  Am CBG in desired range Start low dose glipizide  to control pm CBGs 1-17: Some intermittent highs, but fairly well-controlled.  Monitor.   12.  Anemia of chronic disease: Hemoglobin 10.7.  Monitor CBC.       Latest Ref Rng & Units 08/25/2024    6:31 AM 08/23/2024    5:40 AM 08/18/2024   11:13 AM  CBC  WBC 4.0 - 10.5 K/uL 8.4  8.4  8.0   Hemoglobin 13.0 - 17.0 g/dL 89.8  89.7  88.4   Hematocrit 39.0 - 52.0 % 30.4  30.1  34.1   Platelets 150 - 400 K/uL 182  199  198     13.  Hyperlipidemia/CAD: Continue statin, zetia , aspirin  and plavix .    14.  Dental pain: Received IV Unasyn  transition Completed Augmentin  x 4 days f/u dental as OP    1-17: Denies dental pain on exam  15.  Left eye pressure/pain.  Likely secondary to chronic glaucoma.  Gets injections every 2 to 3 months, missed the last injection in October.  Has retinopathy r/t diabetes    - 1-17: Contacted Dr. Austin with ophthalmology, recommended medication management only at this time, appreciate her assistance.  Ultimately, got a hold of patient's outpatient ophthalmology practice with [facility], Dr. Nonah recommended addition of Cosopt  and brimonidine  0.2% eyedrops to the left eye twice daily; added.  Current visual acuity seems to be at his baseline.  Will need to follow-up with their office to resume injections immediately after discharge. 1-18: No further complaints  of eye pain per nursing; they know to notify provider if this recurs. Overall difficult to get accurate hx, on exam has left trap pain , also reported by PT, may have some radiating discomfort to head, but otherwise exam neg LOS: 11 days A FACE TO FACE EVALUATION WAS PERFORMED  Prentice FORBES Compton 08/25/2024, 7:55 AM     "

## 2024-08-25 NOTE — Progress Notes (Signed)
 Speech Language Pathology Discharge Summary  Patient Details  Name: Blake Santana MRN: [id number] Date of Birth: 1971-02-15  Date of Discharge from SLP service:August 25, 2024  Today's Date: 08/25/2024 SLP Individual Time: 1000-1059 SLP Individual Time Calculation (min): 59 min   Skilled Therapeutic Interventions:   Skilled therapy session focused on communication goals. SLP facilitated session by targeting expressive language tasks. Patient recalled name and numbers 1-5 given written model! Patient verbalized days of the week given verbal and written maxA, with occasional perseverations. Patient continues to demonstrate difficulty with repetition and naming, often times with apraxic errors and paraphasias/neologisms. Patient with occasional spontaneous verbalizations of first, hand, car, and shoe. SLP targeted receptive language through ID of functional objects. Patient with ~50% accuracy given maxA and independently named glove. Patient able to follow simple commands and answer yes/no questions with 75% accuracy given visual and verbal maxA. Patient left in bed with alarm set and call bell in reach. Continue POC.    Patient has met 3 of 3 long term goals.  Patient to discharge at overall Max;Min level.  Reasons goals not met: n/a   Clinical Impression/Discharge Summary: Pt has made great gains and has met 3 of 3 LTG's this admission due to improved communication of wants/needs, following directions, answering yes/no questions and swallowing. Pt is currently an overall maxA for communicative tasks and requires min cues for utilization of swallowing compensatory strategies to minimize overt s/sx of aspiration with D3/thin diet. Pt/family education complete and pt will discharge home with 24 hour supervision from friends/family/etc. Pt would benefit from f/u ST services to maximize communication in order to maximize functional independence.   Care Partner:  Caregiver Able to  Provide Assistance: Yes  Type of Caregiver Assistance: Physical;Cognitive  Recommendation:  Home Health SLP;Outpatient SLP;24 hour supervision/assistance  Rationale for SLP Follow Up: Maximize functional communication   Equipment: n/a   Reasons for discharge: Discharged from hospital;Treatment goals met   Patient/Family Agrees with Progress Made and Goals Achieved: Yes    Blake Santana M.A., CCC-SLP 08/25/2024, 12:31 PM

## 2024-08-25 NOTE — Progress Notes (Signed)
 Physical Therapy Session Note  Patient Details  Name: Emmitte Surgeon MRN: [id number] Date of Birth: October 05, 1970  Today's Date: 08/25/2024 PT Individual Time: 9192-9083 PT Individual Time Calculation (min): 69 min   Short Term Goals: Week 1:  PT Short Term Goal 1 (Week 1): STG=LTG due to LOS  Skilled Therapeutic Interventions/Progress Updates: Patient sitting EOB with significant other (SO) and interpretor present on entrance to room. Patient alert and agreeable to PT session.   Patient reported no pain. Session focused on grad day (see d/c for summary). HEP provided. PTA discussed with pt and pt SO that pt will required close supervision/CGA due to pt's poor visual deficits and occasionally losing balance while ambulating. Pt performed all bed mobility (supine<>sit) and transfers (sit<>Stand in preparation for functional mobility) with supervision and cue to increase awareness to where seat is at and to avoid reaching out of BOS to arm rest.  TUG (avg = 11.26s) with no AD and with supervision  - 12.32s  - 10.92s  - 10.53s  Access Code: [email or web address] URL: [email or web address] Date: 08/25/2024 Prepared by: Delinda Bertrand  Exercises (green theraband provided) - Seated Scapular Retraction with Resistance  - 1 x daily - 7 x weekly - 3 sets - 15 reps - Seated Shoulder Horizontal Abduction with Resistance  - 1 x daily - 7 x weekly - 3 sets - 10 reps - Seated Levator Scapulae Stretch  - 1 x daily - 7 x weekly - 3 sets - 30 hold - Sit to Stand with Arms Crossed  - 1 x daily - 7 x weekly - 3 sets - 10 reps - Seated Cervical Sidebending Stretch  - 1 x daily - 7 x weekly - 3 sets - 30 hold - Romberg Stance with Head Nods  - 1 x daily - 7 x weekly - 3 sets - 10 reps  Pt performed the above exercises with VC for mechanics and set up.  Pt navigated 12 (6) steps with BHR and overall close supervision/CGA. Pt had one moment of L foot slipping off step due to not having  entirety of foot on step but was able to take hip strategy and use of B UE to maintain standing balance with light minA for safety. Pt educated on tapping heel on previous step to ensure entire foot is on step.  Patient supine in bed at end of session with brakes locked, bed alarm set, and all needs within reach.      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 Restrictions Weight Bearing Restrictions Per Provider Order: No  Therapy/Group: Individual Therapy  Kelton Bultman PTA 08/25/2024, 12:21 PM

## 2024-08-25 NOTE — Plan of Care (Signed)
  Problem: RH Swallowing Goal: LTG Patient will consume least restrictive diet using compensatory strategies with assistance (SLP) Description: LTG:  Patient will consume least restrictive diet using compensatory strategies with assistance (SLP) Outcome: Completed/Met   Problem: RH Comprehension Communication Goal: LTG Patient will comprehend basic/complex auditory (SLP) Description: LTG: Patient will comprehend basic/complex auditory information with cues (SLP). Outcome: Completed/Met   Problem: RH Expression Communication Goal: LTG Patient will express needs/wants via multi-modal(SLP) Description: LTG:  Patient will express needs/wants via multi-modal communication (gestures/written, etc) with cues (SLP) Outcome: Completed/Met

## 2024-08-25 NOTE — Progress Notes (Signed)
" °  [facility] Progress Note   Subjective:   Seen in room. Nodding to my questions - seems to fully understand. Denies CP/dyspnea. For HD later today.  Objective Vitals:   08/24/24 2047 08/25/24 0500 08/25/24 0626 08/25/24 1003  BP: 129/69  (!) 154/71 (!) 148/66  Pulse: 68  62 63  Resp:   17   Temp: 97.8 F (36.6 C)  98.4 F (36.9 C)   TempSrc: Oral  Oral   SpO2: 90%  93%   Weight:  69 kg    Height:       Physical Exam General: Well appearing, NAD. Room air Heart: RRR Lungs: CTAB Abdomen: soft Extremities: no LE edema Dialysis Access:  AVF +t/b  Additional Objective Labs: Basic Metabolic Panel: Recent Labs  Lab 08/23/24 0540 08/24/24 0955 08/25/24 0631  NA 128* 130*  --   K 6.6* 4.9  --   CL 88* 89*  --   CO2 28 27  --   GLUCOSE 107* 165*  --   BUN 50* 32*  --   CREATININE 9.20* 7.28*  --   CALCIUM  7.5* 7.9*  --   PHOS 5.4* 4.7* 5.0*   Liver Function Tests: Recent Labs  Lab 08/23/24 0540 08/24/24 0955  ALBUMIN 3.6 3.6   CBC: Recent Labs  Lab 08/23/24 0540 08/25/24 0631  WBC 8.4 8.4  NEUTROABS 4.4 4.7  HGB 10.2* 10.1*  HCT 30.1* 30.4*  MCV 94.4 95.0  PLT 199 182   Medications:   amLODipine   10 mg Oral Daily   brimonidine   1 drop Left Eye BID   calcium  acetate  667 mg Oral TID WC   carvedilol   12.5 mg Oral BID WC   Chlorhexidine  Gluconate Cloth  6 each Topical Q0600   clopidogrel   75 mg Oral Daily   dorzolamide -timolol   1 drop Left Eye BID   doxercalciferol   2 mcg Intravenous Q M,W,F-HD   ezetimibe   10 mg Oral Daily   gabapentin   100 mg Oral TID   glipiZIDE   2.5 mg Oral QAC breakfast   heparin   5,000 Units Subcutaneous Q8H   heparin  sodium (porcine)  2,500 Units Intravenous Once   insulin  aspart  0-5 Units Subcutaneous QHS   insulin  aspart  0-6 Units Subcutaneous TID WC   rosuvastatin   10 mg Oral Daily    Dialysis Orders [facility] MWF 3h B350 65.2kg  AVF  Hep 2000  Last OP HD 1/02, post wt 67.2kg Hep B SAg neg  07/10/24 Heb B SAb +Immune 05/15/24   Assessment/Plan:  ESRD - on HD MWF, on HD - for HD today, with heparin  bolus   Acute CVA  - L posterior MCA territory per MRI - Persistent aphasia - On Plavix /Statin/ezetimibe  - In CIR   HTN/Volume - on norvasc  10mg  daily and Coreg  12.5mg  BID - per neuro -> try to maintain SBP 130-150 given L M2 occlusion - UF as tolerated   Anemia of ESRD - Hgb 10.1, follow without ESA for now    Secondary hyperparathyroidism/Hx parathyroidectomy 2024 - Ca low end, continue Phoslo  and Hectoral - Phos good   Nutrition - Alb ok, continue renal diet  T2DM - Meds per primary   Izetta Boehringer, PA-C 08/25/2024, 11:37 AM  [facility]    "

## 2024-08-25 NOTE — Progress Notes (Signed)
" °   08/25/24 1732  Vitals  Temp 97.7 F (36.5 C)  Temp Source Oral  BP (!) 176/73  MAP (mmHg) 103  Pulse Rate (!) 125  ECG Heart Rate 63  Resp 12  Oxygen Therapy  SpO2 97 %  O2 Device Room Air  During Treatment Monitoring  Duration of HD Treatment -hour(s) 3 hour(s)  HD Safety Checks Performed Yes  Intra-Hemodialysis Comments Tx completed  Post Treatment  Dialyzer Clearance Lightly streaked  Liters Processed 72  Fluid Removed (mL) 1000 mL  Tolerated HD Treatment Yes  AVG/AVF Arterial Site Held (minutes) 7 minutes  AVG/AVF Venous Site Held (minutes) 7 minutes  Fistula / Graft Left Forearm Arteriovenous fistula  Placement Date/Time: 08/09/17 1616   Placed prior to admission: No  Orientation: Left  Access Location: (c) Forearm  Access Type: Arteriovenous fistula  Site Condition No complications  Fistula / Graft Assessment Present;Thrill;Bruit  Status Patent;Deaccessed  Drainage Description None    "

## 2024-08-26 ENCOUNTER — Other Ambulatory Visit (HOSPITAL_COMMUNITY): Payer: Self-pay

## 2024-08-26 DIAGNOSIS — R739 Hyperglycemia, unspecified: Secondary | ICD-10-CM

## 2024-08-26 DIAGNOSIS — I10 Essential (primary) hypertension: Secondary | ICD-10-CM

## 2024-08-26 LAB — GLUCOSE, CAPILLARY: Glucose-Capillary: 101 mg/dL — ABNORMAL HIGH (ref 70–99)

## 2024-08-26 NOTE — Progress Notes (Signed)
 "                                                        PROGRESS NOTE   Subjective/Complaints:  Pt doing well, ready to go home. Family at bedside assists with history.  Slept well, denies pain. LBM yesterday per family although not documented. Dialysis yesterday was fine. No other complaints or concerns. Paperwork reviewed.    Review of systems:  Severe aphasia    Objective:   No results found. Recent Labs    08/25/24 0631  WBC 8.4  HGB 10.1*  HCT 30.4*  PLT 182    Recent Labs    08/24/24 0955 08/25/24 2128  NA 130* 129*  K 4.9 5.2*  CL 89* 90*  CO2 27 29  GLUCOSE 165* 255*  BUN 32* 21*  CREATININE 7.28* 5.43*  CALCIUM  7.9* 8.0*     Intake/Output Summary (Last 24 hours) at 08/26/2024 0949 Last data filed at 08/25/2024 1732 Gross per 24 hour  Intake 358 ml  Output 1000 ml  Net -642 ml        Physical Exam: Vital Signs Blood pressure (!) 155/71, pulse 66, temperature (!) 97.5 F (36.4 C), temperature source Oral, resp. rate 16, height 5' 2 (1.575 m), weight 69.6 kg, SpO2 100%.   General: No acute distress.  Laying in bed, sleeping but easily awoken. 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  No injection or notable discharge. Heart: Regular rate and rhythm no rubs murmurs or extra sounds Lungs: Clear to auscultation, breathing unlabored, no rales or wheezes Abdomen: Positive bowel sounds, soft nontender to palpation, nondistended Extremities: No clubbing, cyanosis, or edema Skin: No evidence of breakdown, no evidence of rash over exposed surfaces Neuro: aphasia  PRIOR EXAMS: Neuro: Awake alert unable to follow commands without gestural cues due to receptive aphasia and apraxia , moving all 4 limbs  Sensory exam: cannot assess due to apraxia, aphaisa Motor exam: Limited by aphasia and apraxia grossly 4 out of 5 right upper and lower extremity, 5 out of 5 left  upper and lower extremity.  Cranial nerves: Grossly  intact. MSK- No RUE swelling , no pain with ROM , no discoloration   Assessment/Plan: 1. Functional deficits which require 3+ hours per day of interdisciplinary therapy in a comprehensive inpatient rehab setting. Physiatrist is providing close team supervision and 24 hour management of active medical problems listed below. Physiatrist and rehab team continue to assess barriers to discharge/monitor patient progress toward functional and medical goals  Care Tool:  Bathing    Body parts bathed by patient: Chest, Abdomen, Front perineal area, Right upper leg, Left upper leg, Right lower leg, Left lower leg, Face, Right arm, Buttocks, Left arm   Body parts bathed by helper: Left arm     Bathing assist Assist Level: Supervision/Verbal cueing     Upper Body Dressing/Undressing Upper body dressing   What is the patient wearing?: Pull over shirt    Upper body assist Assist Level: Supervision/Verbal cueing    Lower Body Dressing/Undressing Lower body dressing      What is the patient wearing?: Underwear/pull up, Pants     Lower body assist Assist for lower body dressing: Supervision/Verbal cueing     Toileting Toileting    Toileting assist Assist for  toileting: Supervision/Verbal cueing     Transfers Chair/bed transfer  Transfers assist     Chair/bed transfer assist level: Supervision/Verbal cueing     Locomotion Ambulation   Ambulation assist      Assist level: Contact Guard/Touching assist Assistive device: No Device Max distance: 150   Walk 10 feet activity   Assist     Assist level: Contact Guard/Touching assist Assistive device: No Device   Walk 50 feet activity   Assist    Assist level: Contact Guard/Touching assist Assistive device: No Device    Walk 150 feet activity   Assist Walk 150 feet activity did not occur: Safety/medical concerns (fatigue, weakness)  Assist level: Contact Guard/Touching assist Assistive device: No Device     Walk 10 feet on uneven surface  activity   Assist Walk 10 feet on uneven surfaces activity did not occur: Safety/medical concerns (fatigue, weakness)   Assist level: Contact Guard/Touching assist Assistive device: Other (comment) (none)   Wheelchair     Assist Is the patient using a wheelchair?: No Type of Wheelchair: Manual    Wheelchair assist level: Dependent - Patient 0% Max wheelchair distance: >387ft    Wheelchair 50 feet with 2 turns activity    Assist        Assist Level: Dependent - Patient 0%   Wheelchair 150 feet activity     Assist      Assist Level: Dependent - Patient 0%   Blood pressure (!) 155/71, pulse 66, temperature (!) 97.5 F (36.4 C), temperature source Oral, resp. rate 16, height 5' 2 (1.575 m), weight 69.6 kg, SpO2 100%.  Medical Problem List and Plan: 1. Functional deficits secondary to Left MCA infarct             -patient may  shower             -ELOS/Goals: 1/24 , Sup-MinA goals,per wife able to leave early to avoid snow ,  Due to lack of insurance will not have therapy post d/c, perhaps pt may be able to go to [facility]  Will need f/u PCP, Nephro, Optho (retina specialist)  PMR and Neuro   -08/26/24 d/c home today, paperwork reviewed, questions answered  2.  Antithrombotics: -DVT/anticoagulation:  Mechanical: Sequential compression devices, below knee Bilateral lower extremities Pharmaceutical: Heparin              -antiplatelet therapy: Aspirin  and Plavix  for 3 months, then followed by aspirin  alone.   3. Pain Management: Tylenol  prn   1-17: Add gabapentin  100 mg 3 times daily for left facial sensitivity/pain; may be more related to glaucoma as below although seems to separate processes on exam  1-18: No complaints overnight, continue current regimen.   4. Mood/Behavior/Sleep: LCSW to follow for evaluation and support when available.              -antipsychotic agents: n/a             -sleep: Trazadone prn     5. Neuropsych/cognition: This patient is not capable of making decisions on his  own behalf.   6. Skin/Wound Care: Routine pressure relief measures.    7. Fluids/Electrolytes/Nutrition: Monitor I&O and weight. Follow up labs monitored by HD.    8.  Acute CVA: Continue DAPT aspirin  and Plavix  for 3 months, then followed by aspirin  alone.  Continue Crestor  and Zetia .  Follow-up outpatient with cardiology for heart monitor.   9.  ESRD: On HD TTS, nephrology following.   10.  Chronic  HFpEF: Continue fluid removal per HD.    - No signs of volume overload Filed Weights   08/25/24 0500 08/25/24 1419 08/26/24 0500  Weight: 69 kg 69 kg 69.6 kg    11.  Type 2 diabetes: A1c 8.5, poorly controlled.  Used glipizide  10 mg at home. -Monitor glucose ACHS with SSI NovoLog . CBG (last 3)  Recent Labs    08/25/24 1853 08/25/24 2045 08/26/24 0536  GLUCAP 131* 230* 101*  Am CBG in desired range Start low dose glipizide  to control pm CBGs 1-17: Some intermittent highs, but fairly well-controlled.  Monitor. -08/26/24 CBGs fairly controlled overall, monitor outpatient   12.  Anemia of chronic disease: Hemoglobin 10.7.  Monitor CBC.       Latest Ref Rng & Units 08/25/2024    6:31 AM 08/23/2024    5:40 AM 08/18/2024   11:13 AM  CBC  WBC 4.0 - 10.5 K/uL 8.4  8.4  8.0   Hemoglobin 13.0 - 17.0 g/dL 89.8  89.7  88.4   Hematocrit 39.0 - 52.0 % 30.4  30.1  34.1   Platelets 150 - 400 K/uL 182  199  198     13.  Hyperlipidemia/CAD: Continue statin, zetia , aspirin  and plavix .    14.  Dental pain: Received IV Unasyn  transition Completed Augmentin  x 4 days f/u dental as OP    1-17: Denies dental pain on exam  15.  Left eye pressure/pain.  Likely secondary to chronic glaucoma.  Gets injections every 2 to 3 months, missed the last injection in October.  Has retinopathy r/t diabetes    - 1-17: Contacted Dr. Austin with ophthalmology, recommended medication management only at this time, appreciate her  assistance.  Ultimately, got a hold of patient's outpatient ophthalmology practice with [facility], Dr. Nonah recommended addition of Cosopt  and brimonidine  0.2% eyedrops to the left eye twice daily; added.  Current visual acuity seems to be at his baseline.  Will need to follow-up with their office to resume injections immediately after discharge. 1-18: No further complaints of eye pain per nursing; they know to notify provider if this recurs. Overall difficult to get accurate hx, on exam has left trap pain , also reported by PT, may have some radiating discomfort to head, but otherwise exam neg   LOS: 12 days A FACE TO [facility] EVALUATION WAS PERFORMED  [address] 08/26/2024, 9:49 AM     "

## 2024-08-26 NOTE — Progress Notes (Signed)
" °  Inpatient Rehabilitation Discharge Medication Review by a Pharmacist  A complete drug regimen review was completed for this patient to identify any potential clinically significant medication issues.  High Risk Drug Classes Is patient taking? Indication by Medication  Antipsychotic No   Anticoagulant No   Antibiotic No   Opioid No   Antiplatelet Yes Clopidogrel  and aspirin  through 4/5 then aspirin  alone  Hypoglycemics/insulin  Yes Glipizide  - DM  Vasoactive Medication Yes Amlodipine , Carvedilol  - HTN  Chemotherapy No   Other Yes Ezetimibe , Rosuvastatin - HLD Brimonidine , dorzolamide -timolol , artificial tears - glaucoma Acetaminophen - as needed for pain Calcitriol , calcium  acetate - hyperphosphatemia     Type of Medication Issue Identified Description of Issue Recommendation(s)  Drug Interaction(s) (clinically significant)     Duplicate Therapy     Allergy     No Medication Administration End Date     Incorrect Dose     Additional Drug Therapy Needed     Significant med changes from prior encounter (inform family/care partners about these prior to discharge). New eye drops, gabapentin , calcium  acetate.  New DAPT (aspirin /clopidogrel ) through 11/05/24 then aspirin  alone Aspirin  was not ordered on discharge med list- after discussing with CIR team, called patient and instructed patient/family to take 81 mg aspirin  daily.  Glipizide  dose reduced  Pause taking lanthanum  until told to start again.  Antibiotic course finished.   Other       Clinically significant medication issues were identified that warrant physician communication and completion of prescribed/recommended actions by midnight of the next day:  Yes  Name of provider notified for urgent issues identified: [employer] regarding aspirin  81 mg that patient was supposed to be on that was not continued on admission to CIR, throughout admission, or on discharge home. After discussion, called  patient/family and told them to take OTC aspirin  81 mg daily and follow-up at next visit. Added aspirin  81 mg to med rec list.    Time spent performing this drug regimen review (minutes): 60 minutes  Larraine Brazier, PharmD Clinical Pharmacist 08/26/2024  12:41 PM **Pharmacist phone directory can now be found on [email or web address] ([email or web address]).  Listed under [facility].   "

## 2024-08-26 NOTE — Progress Notes (Signed)
 " [facility] Progress Note   Subjective:   Seen in room - significant other in room. Being discharged today. Reminded to watch fluid/K intake over weekend and watch news for notification of delay for HD on Monday. No CP/dyspnea. S/p HD yesterday - 1L off.  Objective Vitals:   08/25/24 2031 08/25/24 2218 08/26/24 0356 08/26/24 0500  BP: (!) 188/79 (!) 173/72 (!) 155/71   Pulse: 72 67 66   Resp: 16  16   Temp: 98 F (36.7 C)  (!) 97.5 F (36.4 C)   TempSrc: Oral  Oral   SpO2: 97%  100%   Weight:    69.6 kg  Height:       Physical Exam General: Well appearing, NAD. Room air Heart: RRR Lungs: CTAB Abdomen: soft Extremities: no LE edema Dialysis Access:  AVF +t/b  Additional Objective Labs: Basic Metabolic Panel: Recent Labs  Lab 08/23/24 0540 08/24/24 0955 08/25/24 0631 08/25/24 2128  NA 128* 130*  --  129*  K 6.6* 4.9  --  5.2*  CL 88* 89*  --  90*  CO2 28 27  --  29  GLUCOSE 107* 165*  --  255*  BUN 50* 32*  --  21*  CREATININE 9.20* 7.28*  --  5.43*  CALCIUM  7.5* 7.9*  --  8.0*  PHOS 5.4* 4.7* 5.0*  --    Liver Function Tests: Recent Labs  Lab 08/23/24 0540 08/24/24 0955  ALBUMIN 3.6 3.6   CBC: Recent Labs  Lab 08/23/24 0540 08/25/24 0631  WBC 8.4 8.4  NEUTROABS 4.4 4.7  HGB 10.2* 10.1*  HCT 30.1* 30.4*  MCV 94.4 95.0  PLT 199 182   Blood Culture    Component Value Date/Time   SDES BLOOD RIGHT FOREARM 03/29/2020 2250   SPECREQUEST  03/29/2020 2250    BOTTLES DRAWN AEROBIC AND ANAEROBIC Blood Culture adequate volume   CULT  03/29/2020 2250    NO GROWTH 5 DAYS Performed at [facility], [address], [place], [place] [address]    REPTSTATUS 04/03/2020 FINAL 03/29/2020 2250   Medications:   amLODipine   10 mg Oral Daily   brimonidine   1 drop Left Eye BID   calcium  acetate  667 mg Oral TID WC   carvedilol   12.5 mg Oral BID WC   Chlorhexidine  Gluconate Cloth  6 each Topical Q0600   clopidogrel   75 mg Oral Daily    dorzolamide -timolol   1 drop Left Eye BID   doxercalciferol   2 mcg Intravenous Q M,W,F-HD   ezetimibe   10 mg Oral Daily   gabapentin   100 mg Oral TID   glipiZIDE   2.5 mg Oral QAC breakfast   heparin   5,000 Units Subcutaneous Q8H   heparin  sodium (porcine)  2,500 Units Intravenous Once   insulin  aspart  0-5 Units Subcutaneous QHS   insulin  aspart  0-6 Units Subcutaneous TID WC   rosuvastatin   10 mg Oral Daily    Dialysis [facility] M[facility] 3h B350 65.2kg  AVF  Hep 2000  Last OP HD 1/02, post wt 67.2kg Hep B SAg neg 07/10/24 Heb B SAb +Immune 05/15/24   Assessment/Plan:   ESRD - on HD MWF - Ok for discharge today, next HD Monday as outpatient.   Acute CVA  - L posterior MCA territory per MRI - Persistent aphasia - On Plavix /Statin/ezetimibe    HTN/Volume - on norvasc  10mg  daily and Coreg  12.5mg  BID - per neuro -> try to maintain SBP 130-150 given L M2  occlusion - UF as tolerated   Anemia of ESRD - Hgb 10.1, follow without ESA for now    Secondary hyperparathyroidism/Hx parathyroidectomy 2024 - Ca low end, continue Phoslo  and Hectoral - Phos good   Nutrition - Alb ok, continue renal diet   T2DM - Meds per primary   Izetta Boehringer, PA-C 08/26/2024, 8:37 AM  [facility]    "

## 2024-08-28 NOTE — Progress Notes (Signed)
 Late note entry 1/26 0936am  D/c over weekend noted. Contacted out-pt HD clinic [facility] and informed of pt d/c and anticipated arrival to clinic on today pending weather. No further support is needed.     Lavanda Parris [facility] Navigator [phone]

## 2024-09-03 DEATH — deceased

## 2024-09-07 ENCOUNTER — Encounter: Payer: Self-pay | Admitting: Registered Nurse

## 2024-09-25 ENCOUNTER — Ambulatory Visit: Payer: MEDICAID | Admitting: Cardiology
